# Patient Record
Sex: Female | Born: 1945 | Race: Black or African American | Hispanic: No | Smoking: Current some day smoker
Health system: Southern US, Community
[De-identification: ages and names within clinical notes are randomized; demographics above are authoritative.]

## PROBLEM LIST (undated history)

## (undated) DIAGNOSIS — E119 Type 2 diabetes mellitus without complications: Secondary | ICD-10-CM

## (undated) DIAGNOSIS — R5381 Other malaise: Secondary | ICD-10-CM

## (undated) DIAGNOSIS — G8929 Other chronic pain: Secondary | ICD-10-CM

## (undated) DIAGNOSIS — E46 Unspecified protein-calorie malnutrition: Secondary | ICD-10-CM

## (undated) DIAGNOSIS — M549 Dorsalgia, unspecified: Secondary | ICD-10-CM

## (undated) DIAGNOSIS — F419 Anxiety disorder, unspecified: Secondary | ICD-10-CM

## (undated) DIAGNOSIS — R0602 Shortness of breath: Secondary | ICD-10-CM

## (undated) DIAGNOSIS — D638 Anemia in other chronic diseases classified elsewhere: Secondary | ICD-10-CM

## (undated) DIAGNOSIS — D72829 Elevated white blood cell count, unspecified: Secondary | ICD-10-CM

## (undated) DIAGNOSIS — J9612 Chronic respiratory failure with hypercapnia: Secondary | ICD-10-CM

## (undated) DIAGNOSIS — M8580 Other specified disorders of bone density and structure, unspecified site: Secondary | ICD-10-CM

## (undated) DIAGNOSIS — I10 Essential (primary) hypertension: Secondary | ICD-10-CM

## (undated) DIAGNOSIS — J449 Chronic obstructive pulmonary disease, unspecified: Secondary | ICD-10-CM

## (undated) DIAGNOSIS — E785 Hyperlipidemia, unspecified: Secondary | ICD-10-CM

## (undated) DIAGNOSIS — J189 Pneumonia, unspecified organism: Secondary | ICD-10-CM

## (undated) DIAGNOSIS — Z9981 Dependence on supplemental oxygen: Secondary | ICD-10-CM

## (undated) DIAGNOSIS — F1011 Alcohol abuse, in remission: Secondary | ICD-10-CM

## (undated) DIAGNOSIS — J441 Chronic obstructive pulmonary disease with (acute) exacerbation: Secondary | ICD-10-CM

## (undated) DIAGNOSIS — K224 Dyskinesia of esophagus: Secondary | ICD-10-CM

## (undated) DIAGNOSIS — Z720 Tobacco use: Secondary | ICD-10-CM

## (undated) DIAGNOSIS — I739 Peripheral vascular disease, unspecified: Secondary | ICD-10-CM

## (undated) DIAGNOSIS — G709 Myoneural disorder, unspecified: Secondary | ICD-10-CM

## (undated) DIAGNOSIS — M1990 Unspecified osteoarthritis, unspecified site: Secondary | ICD-10-CM

## (undated) DIAGNOSIS — E559 Vitamin D deficiency, unspecified: Secondary | ICD-10-CM

## (undated) DIAGNOSIS — R1310 Dysphagia, unspecified: Secondary | ICD-10-CM

## (undated) HISTORY — DX: Other chronic pain: G89.29

## (undated) HISTORY — DX: Chronic obstructive pulmonary disease with (acute) exacerbation: J44.1

## (undated) HISTORY — PX: ABDOMINAL HYSTERECTOMY: SHX81

## (undated) HISTORY — DX: Anemia in other chronic diseases classified elsewhere: D63.8

## (undated) HISTORY — DX: Pneumonia, unspecified organism: J18.9

## (undated) HISTORY — DX: Chronic respiratory failure with hypercapnia: J96.12

## (undated) HISTORY — DX: Dorsalgia, unspecified: M54.9

## (undated) HISTORY — DX: Dysphagia, unspecified: R13.10

## (undated) HISTORY — DX: Essential (primary) hypertension: I10

## (undated) HISTORY — DX: Unspecified protein-calorie malnutrition: E46

## (undated) HISTORY — DX: Dyskinesia of esophagus: K22.4

## (undated) HISTORY — DX: Peripheral vascular disease, unspecified: I73.9

## (undated) HISTORY — DX: Tobacco use: Z72.0

## (undated) HISTORY — DX: Hyperlipidemia, unspecified: E78.5

## (undated) HISTORY — DX: Anxiety disorder, unspecified: F41.9

## (undated) HISTORY — DX: Other malaise: R53.81

## (undated) HISTORY — PX: BACK SURGERY: SHX140

## (undated) HISTORY — PX: CARPAL TUNNEL RELEASE: SHX101

## (undated) HISTORY — DX: Other specified disorders of bone density and structure, unspecified site: M85.80

## (undated) HISTORY — DX: Elevated white blood cell count, unspecified: D72.829

## (undated) HISTORY — DX: Vitamin D deficiency, unspecified: E55.9

## (undated) HISTORY — DX: Chronic obstructive pulmonary disease, unspecified: J44.9

## (undated) HISTORY — DX: Alcohol abuse, in remission: F10.11

---
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
## 1997-04-23 ENCOUNTER — Ambulatory Visit (HOSPITAL_COMMUNITY): Admission: RE | Admit: 1997-04-23 | Discharge: 1997-04-23 | Payer: Self-pay | Admitting: Family Medicine

## 1998-04-25 ENCOUNTER — Encounter: Payer: Self-pay | Admitting: Family Medicine

## 1998-04-25 ENCOUNTER — Ambulatory Visit (HOSPITAL_COMMUNITY): Admission: RE | Admit: 1998-04-25 | Discharge: 1998-04-25 | Payer: Self-pay | Admitting: Family Medicine

## 1998-04-30 ENCOUNTER — Other Ambulatory Visit: Admission: RE | Admit: 1998-04-30 | Discharge: 1998-04-30 | Payer: Self-pay | Admitting: Family Medicine

## 1998-09-18 ENCOUNTER — Ambulatory Visit (HOSPITAL_COMMUNITY): Admission: RE | Admit: 1998-09-18 | Discharge: 1998-09-18 | Payer: Self-pay | Admitting: Family Medicine

## 1998-09-18 ENCOUNTER — Encounter: Payer: Self-pay | Admitting: Family Medicine

## 1998-10-27 ENCOUNTER — Encounter: Admission: RE | Admit: 1998-10-27 | Discharge: 1999-01-25 | Payer: Self-pay | Admitting: Neurological Surgery

## 1998-11-03 ENCOUNTER — Encounter (HOSPITAL_BASED_OUTPATIENT_CLINIC_OR_DEPARTMENT_OTHER): Payer: Self-pay | Admitting: General Surgery

## 1998-11-03 ENCOUNTER — Ambulatory Visit (HOSPITAL_COMMUNITY): Admission: RE | Admit: 1998-11-03 | Discharge: 1998-11-03 | Payer: Self-pay | Admitting: General Surgery

## 1999-04-30 ENCOUNTER — Ambulatory Visit (HOSPITAL_COMMUNITY): Admission: RE | Admit: 1999-04-30 | Discharge: 1999-04-30 | Payer: Self-pay | Admitting: Family Medicine

## 1999-04-30 ENCOUNTER — Encounter: Payer: Self-pay | Admitting: Family Medicine

## 1999-05-12 ENCOUNTER — Other Ambulatory Visit: Admission: RE | Admit: 1999-05-12 | Discharge: 1999-05-12 | Payer: Self-pay | Admitting: Family Medicine

## 1999-07-12 ENCOUNTER — Encounter: Payer: Self-pay | Admitting: Neurological Surgery

## 1999-07-12 ENCOUNTER — Ambulatory Visit (HOSPITAL_COMMUNITY): Admission: RE | Admit: 1999-07-12 | Discharge: 1999-07-12 | Payer: Self-pay | Admitting: Neurological Surgery

## 2000-01-26 HISTORY — PX: POSTERIOR LUMBAR FUSION: SHX6036

## 2000-05-02 ENCOUNTER — Encounter: Payer: Self-pay | Admitting: Family Medicine

## 2000-05-02 ENCOUNTER — Ambulatory Visit (HOSPITAL_COMMUNITY): Admission: RE | Admit: 2000-05-02 | Discharge: 2000-05-02 | Payer: Self-pay | Admitting: Family Medicine

## 2000-05-10 ENCOUNTER — Other Ambulatory Visit: Admission: RE | Admit: 2000-05-10 | Discharge: 2000-05-10 | Payer: Self-pay | Admitting: Family Medicine

## 2001-04-06 ENCOUNTER — Encounter: Payer: Self-pay | Admitting: Neurological Surgery

## 2001-04-10 ENCOUNTER — Encounter: Payer: Self-pay | Admitting: Neurological Surgery

## 2001-04-10 ENCOUNTER — Observation Stay (HOSPITAL_COMMUNITY): Admission: RE | Admit: 2001-04-10 | Discharge: 2001-04-11 | Payer: Self-pay | Admitting: Neurological Surgery

## 2001-05-04 ENCOUNTER — Encounter: Payer: Self-pay | Admitting: Family Medicine

## 2001-05-04 ENCOUNTER — Ambulatory Visit (HOSPITAL_COMMUNITY): Admission: RE | Admit: 2001-05-04 | Discharge: 2001-05-04 | Payer: Self-pay | Admitting: Family Medicine

## 2001-07-05 ENCOUNTER — Encounter: Admission: RE | Admit: 2001-07-05 | Discharge: 2001-07-05 | Payer: Self-pay | Admitting: Neurological Surgery

## 2001-07-05 ENCOUNTER — Encounter: Payer: Self-pay | Admitting: Neurological Surgery

## 2001-08-24 ENCOUNTER — Other Ambulatory Visit: Admission: RE | Admit: 2001-08-24 | Discharge: 2001-08-24 | Payer: Self-pay | Admitting: Family Medicine

## 2002-05-07 ENCOUNTER — Encounter: Payer: Self-pay | Admitting: Family Medicine

## 2002-05-07 ENCOUNTER — Ambulatory Visit (HOSPITAL_COMMUNITY): Admission: RE | Admit: 2002-05-07 | Discharge: 2002-05-07 | Payer: Self-pay | Admitting: Family Medicine

## 2002-05-24 ENCOUNTER — Other Ambulatory Visit: Admission: RE | Admit: 2002-05-24 | Discharge: 2002-05-24 | Payer: Self-pay | Admitting: Family Medicine

## 2002-08-07 ENCOUNTER — Encounter: Payer: Self-pay | Admitting: Nephrology

## 2002-08-07 ENCOUNTER — Encounter: Admission: RE | Admit: 2002-08-07 | Discharge: 2002-08-07 | Payer: Self-pay | Admitting: Nephrology

## 2003-01-26 HISTORY — PX: LUMBAR LAMINECTOMY/DECOMPRESSION MICRODISCECTOMY: SHX5026

## 2003-05-08 ENCOUNTER — Ambulatory Visit (HOSPITAL_COMMUNITY): Admission: RE | Admit: 2003-05-08 | Discharge: 2003-05-08 | Payer: Self-pay | Admitting: Family Medicine

## 2003-05-13 ENCOUNTER — Ambulatory Visit (HOSPITAL_COMMUNITY): Admission: RE | Admit: 2003-05-13 | Discharge: 2003-05-13 | Payer: Self-pay | Admitting: Neurological Surgery

## 2003-06-17 ENCOUNTER — Encounter: Admission: RE | Admit: 2003-06-17 | Discharge: 2003-06-17 | Payer: Self-pay | Admitting: Family Medicine

## 2003-06-27 ENCOUNTER — Inpatient Hospital Stay (HOSPITAL_COMMUNITY): Admission: RE | Admit: 2003-06-27 | Discharge: 2003-07-05 | Payer: Self-pay | Admitting: Neurological Surgery

## 2003-11-05 ENCOUNTER — Encounter: Admission: RE | Admit: 2003-11-05 | Discharge: 2003-11-27 | Payer: Self-pay | Admitting: Neurological Surgery

## 2004-01-10 ENCOUNTER — Ambulatory Visit (HOSPITAL_COMMUNITY): Admission: RE | Admit: 2004-01-10 | Discharge: 2004-01-10 | Payer: Self-pay | Admitting: Neurological Surgery

## 2004-01-28 ENCOUNTER — Ambulatory Visit (HOSPITAL_COMMUNITY): Admission: RE | Admit: 2004-01-28 | Discharge: 2004-01-28 | Payer: Self-pay | Admitting: Neurological Surgery

## 2004-05-12 ENCOUNTER — Ambulatory Visit (HOSPITAL_COMMUNITY): Admission: RE | Admit: 2004-05-12 | Discharge: 2004-05-12 | Payer: Self-pay | Admitting: Family Medicine

## 2005-01-01 ENCOUNTER — Encounter: Admission: RE | Admit: 2005-01-01 | Discharge: 2005-01-01 | Payer: Self-pay | Admitting: Obstetrics and Gynecology

## 2005-02-19 ENCOUNTER — Encounter: Admission: RE | Admit: 2005-02-19 | Discharge: 2005-02-19 | Payer: Self-pay | Admitting: Orthopedic Surgery

## 2005-02-23 ENCOUNTER — Ambulatory Visit (HOSPITAL_BASED_OUTPATIENT_CLINIC_OR_DEPARTMENT_OTHER): Admission: RE | Admit: 2005-02-23 | Discharge: 2005-02-23 | Payer: Self-pay | Admitting: Orthopedic Surgery

## 2005-05-13 ENCOUNTER — Ambulatory Visit (HOSPITAL_COMMUNITY): Admission: RE | Admit: 2005-05-13 | Discharge: 2005-05-13 | Payer: Self-pay | Admitting: Family Medicine

## 2005-05-19 ENCOUNTER — Ambulatory Visit (HOSPITAL_COMMUNITY): Admission: RE | Admit: 2005-05-19 | Discharge: 2005-05-19 | Payer: Self-pay | Admitting: Internal Medicine

## 2005-12-31 ENCOUNTER — Ambulatory Visit: Payer: Self-pay | Admitting: Internal Medicine

## 2006-01-12 ENCOUNTER — Ambulatory Visit: Payer: Self-pay | Admitting: Internal Medicine

## 2006-05-16 ENCOUNTER — Ambulatory Visit (HOSPITAL_COMMUNITY): Admission: RE | Admit: 2006-05-16 | Discharge: 2006-05-16 | Payer: Self-pay | Admitting: Internal Medicine

## 2006-07-06 ENCOUNTER — Encounter: Admission: RE | Admit: 2006-07-06 | Discharge: 2006-07-06 | Payer: Self-pay | Admitting: Neurological Surgery

## 2007-05-18 ENCOUNTER — Ambulatory Visit (HOSPITAL_COMMUNITY): Admission: RE | Admit: 2007-05-18 | Discharge: 2007-05-18 | Payer: Self-pay | Admitting: Internal Medicine

## 2007-10-04 ENCOUNTER — Ambulatory Visit (HOSPITAL_COMMUNITY): Admission: RE | Admit: 2007-10-04 | Discharge: 2007-10-04 | Payer: Self-pay | Admitting: Neurological Surgery

## 2008-02-07 ENCOUNTER — Ambulatory Visit (HOSPITAL_COMMUNITY): Admission: RE | Admit: 2008-02-07 | Discharge: 2008-02-07 | Payer: Self-pay | Admitting: Internal Medicine

## 2008-05-20 ENCOUNTER — Ambulatory Visit (HOSPITAL_COMMUNITY): Admission: RE | Admit: 2008-05-20 | Discharge: 2008-05-20 | Payer: Self-pay | Admitting: Internal Medicine

## 2009-05-21 ENCOUNTER — Ambulatory Visit (HOSPITAL_COMMUNITY): Admission: RE | Admit: 2009-05-21 | Discharge: 2009-05-21 | Payer: Self-pay | Admitting: Internal Medicine

## 2009-07-26 ENCOUNTER — Emergency Department (HOSPITAL_COMMUNITY): Admission: EM | Admit: 2009-07-26 | Discharge: 2009-07-26 | Payer: Self-pay | Admitting: Emergency Medicine

## 2009-10-31 ENCOUNTER — Ambulatory Visit: Payer: Self-pay | Admitting: Vascular Surgery

## 2010-04-13 ENCOUNTER — Ambulatory Visit (HOSPITAL_COMMUNITY)
Admission: RE | Admit: 2010-04-13 | Discharge: 2010-04-13 | Disposition: A | Discharge status: Home / Self Care | Payer: Medicare Other | Source: Ambulatory Visit | Attending: Internal Medicine | Admitting: Internal Medicine

## 2010-04-13 DIAGNOSIS — J4489 Other specified chronic obstructive pulmonary disease: Secondary | ICD-10-CM | POA: Insufficient documentation

## 2010-04-13 DIAGNOSIS — J449 Chronic obstructive pulmonary disease, unspecified: Secondary | ICD-10-CM | POA: Insufficient documentation

## 2010-04-28 ENCOUNTER — Other Ambulatory Visit (HOSPITAL_COMMUNITY): Payer: Self-pay | Admitting: Internal Medicine

## 2010-04-28 DIAGNOSIS — Z1231 Encounter for screening mammogram for malignant neoplasm of breast: Secondary | ICD-10-CM

## 2010-04-29 ENCOUNTER — Encounter: Payer: Self-pay | Admitting: Internal Medicine

## 2010-04-29 ENCOUNTER — Ambulatory Visit (INDEPENDENT_AMBULATORY_CARE_PROVIDER_SITE_OTHER): Payer: Medicare Other | Admitting: Internal Medicine

## 2010-04-29 DIAGNOSIS — R079 Chest pain, unspecified: Secondary | ICD-10-CM | POA: Insufficient documentation

## 2010-04-29 DIAGNOSIS — J449 Chronic obstructive pulmonary disease, unspecified: Secondary | ICD-10-CM | POA: Insufficient documentation

## 2010-04-29 DIAGNOSIS — Z720 Tobacco use: Secondary | ICD-10-CM

## 2010-04-29 DIAGNOSIS — F17200 Nicotine dependence, unspecified, uncomplicated: Secondary | ICD-10-CM

## 2010-04-29 MED ORDER — QVAR 80 MCG/ACT IN AERS: INHALATION_SPRAY | RESPIRATORY_TRACT | Status: DC

## 2010-04-29 NOTE — Assessment & Plan Note (Signed)
Cad risk factors +. Pain worrisome for angina and ? Has angina equivalent +  PLAN Refer cards

## 2010-04-29 NOTE — Patient Instructions (Signed)
#  COPD  - you have very severe copd with asthma component - stop pulmicort and brovana nebs because of intolerance  - continue spiriva 1 puff daily - start QVAR 2 puff bid (take sample, show technique) - use oxygen with walking and sleep #SMOKING  - please write down an action plan with your husband  - please get booklet about quit smoking from our office  - please get contact # for smoking cessation class at  #Chest Pain   - am referring you to cardiology #Followup - 1 month  - at followup will do spirometry and check alpha 1 genetic test - based on cardiology followup I will refer you to pulmonary rehabilitation

## 2010-04-29 NOTE — Progress Notes (Signed)
SATURATION QUALIFICATIONS:   Patient Saturations on Room Air at Rest = 92%  Patient Saturations on Room Air while Ambulating = 87%  Patient Saturations on 2 Liters of oxygen while Ambulating = 93%   

## 2010-04-29 NOTE — Progress Notes (Signed)
Subjective:    Patient ID: Nicole Maxwell, female    DOB: 10-Apr-1945, 65 y.o.   MRN: [id number]  Shortness of Breath This is a chronic (New complaint for this OV. Insidious onset. Started several years ago) problem. The current episode started more than 1 year ago. The problem occurs intermittently. The problem has been gradually worsening (getting worse past 9 months). Associated symptoms include PND, sputum production and wheezing. Pertinent negatives include no abdominal pain, chest pain, claudication, coryza, ear pain, fever, headaches, leg pain, neck pain, orthopnea, rash, rhinorrhea, sore throat, swollen glands, syncope or vomiting. The symptoms are aggravated by exercise, any activity, emotional upset, smoke, lying flat, weather changes, pollens and odors (Activities like moving across room can make her dyspneic but distance is variable). Associated symptoms comments: Wakes up in morning with bad headache but denies snoring. 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 Heavy tobacco abuse +. 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 Sputum is clear and small amount . Risk factors include smoking. She has tried beta agonist inhalers, steroid inhalers and prescription cough suppressants (tried budesonide neb and brovana for 6-10 months but it made dyspnea worse, quit taking it March 2012. Been on O2 for 10 months but using it prn only but does find it helps. Has been  on hydrocodone - homatropine and it helps cough. Spiriva also helps) for the symptoms. The treatment provided mild (only spiriva and cough suppressant helps somewhat) relief. Her past medical history is significant for chronic lung disease and COPD. There is no history of allergies, aspirin allergies, asthma, bronchiolitis, CAD, DVT, a heart failure, PE, pneumonia or a recent surgery.   Also, central chest pain x 5 months that comes on with exertion and relieved by rest. No radiation. Rates pain  as moderate-severe but not sharp in quality. Associated with dyspnea +  CXR 07/26/2009 shows clear lung fields but hyperinflation. Recent CXR at PMD office report is same. 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 Has hx of negative treadmill cardiac stress test many years ago   Review of Systems  Constitutional: Positive for activity change. Negative for fever.  HENT: Negative.  Negative for ear pain, sore throat, rhinorrhea and neck pain.   Eyes: Negative.   Respiratory: Positive for sputum production, shortness of breath and wheezing.   Cardiovascular: Positive for PND. Negative for chest pain, orthopnea, claudication and syncope.  Gastrointestinal: Negative.  Negative for vomiting and abdominal pain.  Genitourinary: Negative.   Musculoskeletal: Positive for joint swelling and arthralgias. Negative for back pain and gait problem.  Skin: Negative.  Negative for rash.  Neurological: Negative.  Negative for headaches.  Psychiatric/Behavioral: Negative.        Objective:   Physical Exam  Vitals reviewed. Constitutional: She is oriented to person, place, and time. She appears well-developed and well-nourished. No distress.       Overweight. Deconditioned looking  HENT:  Head: Normocephalic and atraumatic.  Right Ear: External ear normal.  Left Ear: External ear normal.  Mouth/Throat: Oropharynx is clear and moist. No oropharyngeal exudate.  Eyes: Conjunctivae and EOM are normal. Pupils are equal, round, and reactive to light. Right eye exhibits no discharge. Left eye exhibits no discharge. No scleral icterus.  Neck: Normal range of motion. Neck supple. No JVD present. No tracheal deviation present. No thyromegaly  present.  Cardiovascular: Normal rate, regular rhythm, normal heart sounds and intact distal pulses.  Exam reveals no gallop and no friction rub.   No murmur  heard. Pulmonary/Chest: Effort normal. No respiratory distress. She has no wheezes. She has no rales. She exhibits no tenderness.       Prolonged expiration No wheeze ful sentences desatrated after walking 2 laps  Abdominal: Soft. Bowel sounds are normal. She exhibits no distension and no mass. There is no tenderness. There is no rebound and no guarding.  Musculoskeletal: Normal range of motion. She exhibits no edema and no tenderness.  Lymphadenopathy:    She has no cervical adenopathy.  Neurological: She is alert and oriented to person, place, and time. She has normal reflexes. No cranial nerve deficit. She exhibits normal muscle tone. Coordination normal.  Skin: Skin is warm and dry. No rash noted. She is not diaphoretic. No erythema. No pallor.  Psychiatric: She has a normal mood and affect. Her behavior is normal. Judgment and thought content normal.          Assessment & Plan:

## 2010-04-29 NOTE — Assessment & Plan Note (Addendum)
Gold stage 4 COPD. Desaturates on exertion. BD response +. Intolerant to brovana and pumicort nebs  PLAN  - you have very severe copd with asthma component - stop pulmicort and brovana nebs because of intolerance  - continue spiriva 1 puff daily - start QVAR 2 puff bid (take sample, show technique) - use oxygen with walking and sleep - next ov check alpha 1 - discuss rehab after cards clearance for chest pain - will discuss prognosis at followup

## 2010-04-29 NOTE — Assessment & Plan Note (Signed)
"   I am addicted" . Prolonged heavy smoking. She wants to quit smoking. Took chantix mid 2000s but failed due to $ issues. Did attend counselling in past. Wants husband also to quit. Agreeable to try chantix again. Willing to go to cessation program again. Willing to try chantix again. Will refer her to quit smoking program at [place]. Reading materials given. Will discuss chantix at next visit Husband also counselled. Advised both of them to come up with joint action plan  11 min counseling

## 2010-05-06 ENCOUNTER — Telehealth: Payer: Self-pay | Admitting: Internal Medicine

## 2010-05-06 NOTE — Telephone Encounter (Signed)
Spoke w/ pt and she states she has been waiting since 04/29/10 to receive her portable oxygen. Pt states Dr. Marchelle Gearing advised her he was going to get her set up w/ portable oxygen at OV on 04/29/10. I am not seeing where MR has mentioned this in his ov. Pt aware MR is out of the office until Monday. Please advise MR if okay to send order to [facility] for portable o2. Thanks

## 2010-05-07 ENCOUNTER — Telehealth: Payer: Self-pay | Admitting: Internal Medicine

## 2010-05-07 NOTE — Telephone Encounter (Signed)
Ok for her to have portable o2

## 2010-05-07 NOTE — Telephone Encounter (Signed)
Pt aware order was sent for her to be set up on portable o2.  Order has already been sent.  Will forward to [facility] so they are aware

## 2010-05-08 NOTE — Telephone Encounter (Signed)
Order faxed to [facility] to get pt portable 02

## 2010-05-11 ENCOUNTER — Telehealth: Payer: Self-pay | Admitting: Internal Medicine

## 2010-05-11 NOTE — Telephone Encounter (Signed)
Pt aware it is okay to try nicotine patches to assist in stopping smoking. She was instructed to call if this does not help or if she is having any breathing issues.

## 2010-05-12 ENCOUNTER — Encounter: Payer: Self-pay | Admitting: Cardiology

## 2010-05-12 ENCOUNTER — Ambulatory Visit (INDEPENDENT_AMBULATORY_CARE_PROVIDER_SITE_OTHER): Payer: Medicare Other | Admitting: Cardiology

## 2010-05-12 DIAGNOSIS — E785 Hyperlipidemia, unspecified: Secondary | ICD-10-CM

## 2010-05-12 DIAGNOSIS — R079 Chest pain, unspecified: Secondary | ICD-10-CM

## 2010-05-12 DIAGNOSIS — F17200 Nicotine dependence, unspecified, uncomplicated: Secondary | ICD-10-CM

## 2010-05-12 DIAGNOSIS — I10 Essential (primary) hypertension: Secondary | ICD-10-CM | POA: Insufficient documentation

## 2010-05-12 DIAGNOSIS — Z720 Tobacco use: Secondary | ICD-10-CM

## 2010-05-12 NOTE — Progress Notes (Signed)
HPI The patient presents for evaluation of chest discomfort. She has no past cardiac history but significant cardiovascular risk factors. She reports that 4 or 5 months ago she started developing acute shortness of breath and chest discomfort and noticed that every time she took [place] and Budesonide that she would become acutely short of breath and had chest discomfort which he described as an aching. This would last for several minutes. It is a midsternal discomfort. It would go away on its own. She did not have associated nausea vomiting or diaphoresis. She did not describe neck or arm discomfort. She stopped taking his medications are the symptoms have resolved. She is very limited by back pain and dyspnea. She reports that she cannot bring on the symptoms with her limited activities. She does not describe PND or orthopnea. She does not describe palpitations, presyncope or syncope.  Allergies  Allergen Reactions  . Brovana (Arformoterol Tartrate) Shortness Of Breath and Cough    General intolerance  . Pulmicort Shortness Of Breath and Cough    General intolerance    Current Outpatient Prescriptions  Medication Sig Dispense Refill  . albuterol (PROVENTIL) (2.5 MG/3ML) 0.083% nebulizer solution Take 2.5 mg by nebulization every 4 (four) hours as needed.        Marland Kitchen alprazolam (XANAX) 2 MG tablet Take 1 mg by mouth at bedtime as needed.        Marland Kitchen aspirin 325 MG tablet Take 325 mg by mouth daily.        . calcium carbonate (OS-CAL) 600 MG TABS Take 600 mg by mouth daily.        . Cholecalciferol (VITAMIN D3) 1000 UNITS CAPS Take 1 capsule by mouth daily.        . ergocalciferol (VITAMIN D2) 50000 UNITS capsule Take 50,000 Units by mouth once a week.        Marland Kitchen HYDROcodone-homatropine (HYCODAN) 5-1.5 MG/5ML syrup Take by mouth every 6 (six) hours as needed.        Marland Kitchen losartan-hydrochlorothiazide (HYZAAR) 100-25 MG per tablet Take 1 tablet by mouth daily.        . metFORMIN (GLUMETZA) 500 MG (MOD) 24 hr  tablet Take 500 mg by mouth 2 (two) times daily with a meal.        . QVAR 80 MCG/ACT inhaler 2 puff twice daily. Rinse mouth after use   1 Inhaler  12  . rosuvastatin (CRESTOR) 20 MG tablet Take 20 mg by mouth daily.        Marland Kitchen tiotropium (SPIRIVA) 18 MCG inhalation capsule Place 18 mcg into inhaler and inhale daily.          Past Medical History  Diagnosis Date  . Hypertension   . Hyperlipidemia   . Anxiety   . Chronic back pain   . Osteopenia   . COPD (chronic obstructive pulmonary disease)     cxr 04/03/2010 - hyperinflation  . PVD (peripheral vascular disease)   . Tobacco abuse     " I am addicted"   . Diabetes mellitus   . Vitamin D deficiency disease     03/26/2010 - 9.9ng/mL. Started on replacemnt  . Alcohol abuse, in remission quit 2004    Past Surgical History  Procedure Date  . Tonsillectomy   . Appendectomy   . Cholecystectomy   . Spinal fusion     Family History  Problem Relation Age of Onset  . Heart attack Sister 80    CABG  . Breast cancer Sister   .  Cancer Brother   . Hypertension Other     all siblings    History   Social History  . Marital Status: Married    Spouse Name: N/A    Number of Children: 1  . Years of Education: N/A   Occupational History  . retired from [employer]    Social History Main Topics  . Smoking status: Current Everyday Smoker -- 1.0 packs/day for 30 years    Types: Cigarettes  . Smokeless tobacco: Not on file   Comment: referred to quit smoking program at [place]  . Alcohol Use: No     h/o heavy ETOH quit 2004  . Drug Use: Not on file  . Sexually Active: Not on file   Other Topics Concern  . Not on file   Social History Narrative  . No narrative on file    ROS:  Positive for cough, difficulty hearing, back pain, occasional constipation, joint pain. Otherwise as stated in the history of present illness negative for all other systems.  PHYSICAL EXAM BP 120/69  Pulse 98  Resp 18  Ht 5\' 4"  (1.626 m)   Wt 153 lb (69.4 kg)  BMI 26.26 kg/m2 GENERAL:  Well appearing HEENT:  Pupils equal round and reactive, fundi not visualized, oral mucosa unremarkable NECK:  No jugular venous distention, waveform within normal limits, carotid upstroke brisk and symmetric, no bruits, no thyromegaly LYMPHATICS:  No cervical, inguinal adenopathy LUNGS:  Clear to auscultation bilaterally BACK:  No CVA tenderness CHEST:  Unremarkable HEART:  PMI not displaced or sustained,S1 and S2 within normal limits, no S3, no S4, no clicks, no rubs, no murmurs ABD:  Flat, positive bowel sounds normal in frequency in pitch, no bruits, no rebound, no guarding, no midline pulsatile mass, no hepatomegaly, no splenomegaly EXT:  2 plus pulses throughout, no edema, no cyanosis no clubbing SKIN:  No rashes no nodules NEURO:  Cranial nerves II through XII grossly intact, motor grossly intact throughout PSYCH:  Cognitively intact, oriented to person place and time   EKG:  Sinus rhythm, right atrial enlargement, left axis deviation, left anterior fascicular block, poor anterior R-wave progression, nonspecific T-wave flattening.  ASSESSMENT AND PLAN

## 2010-05-12 NOTE — Assessment & Plan Note (Signed)
I reviewed her lipid with an LDL of 81 HDL of 51 last month. She will continue with the meds as listed.

## 2010-05-12 NOTE — Patient Instructions (Signed)
You are being scheduled for a Dobutamine Echo.  Your follow up appointment will be based on these results. Continue your current medications

## 2010-05-12 NOTE — Assessment & Plan Note (Signed)
The blood pressure is at target. No change in medications is indicated. We will continue with therapeutic lifestyle changes (TLC).  

## 2010-05-12 NOTE — Assessment & Plan Note (Signed)
We discussed this at length. She does have plans to try to quit and has the patches.Marland Kitchen

## 2010-05-12 NOTE — Assessment & Plan Note (Signed)
The chest pain could have been related to medications. However, with her significant risk factors she needs stress testing. She would not be able to walk on a treadmill. I am going to try a dobutamine echocardiogram she has appropriate windows. Her lung disease probably wouldn't tolerate a Lexiscan if needed.

## 2010-05-14 ENCOUNTER — Encounter: Payer: Self-pay | Admitting: Internal Medicine

## 2010-05-19 ENCOUNTER — Institutional Professional Consult (permissible substitution): Payer: Medicare Other | Admitting: Cardiology

## 2010-05-22 ENCOUNTER — Ambulatory Visit (HOSPITAL_COMMUNITY): Payer: Medicare Other | Attending: Cardiology | Admitting: Radiology

## 2010-05-22 DIAGNOSIS — R072 Precordial pain: Secondary | ICD-10-CM

## 2010-05-22 DIAGNOSIS — R0989 Other specified symptoms and signs involving the circulatory and respiratory systems: Secondary | ICD-10-CM

## 2010-05-22 DIAGNOSIS — R0602 Shortness of breath: Secondary | ICD-10-CM

## 2010-05-22 DIAGNOSIS — R079 Chest pain, unspecified: Secondary | ICD-10-CM | POA: Insufficient documentation

## 2010-05-22 DIAGNOSIS — R0609 Other forms of dyspnea: Secondary | ICD-10-CM

## 2010-05-22 MED ORDER — SODIUM CHLORIDE 0.9 % IV SOLN
30.0000 ug/kg | INTRAVENOUS | Status: DC
  Administered 2010-05-22: 30 ug/kg/min via INTRAVENOUS

## 2010-05-25 ENCOUNTER — Ambulatory Visit (HOSPITAL_COMMUNITY)
Admission: RE | Admit: 2010-05-25 | Discharge: 2010-05-25 | Disposition: A | Discharge status: Home / Self Care | Payer: Medicare Other | Source: Ambulatory Visit | Attending: Internal Medicine | Admitting: Internal Medicine

## 2010-05-25 DIAGNOSIS — Z1231 Encounter for screening mammogram for malignant neoplasm of breast: Secondary | ICD-10-CM

## 2010-05-25 NOTE — Progress Notes (Signed)
Pt aware of results of echo 

## 2010-05-27 ENCOUNTER — Telehealth: Payer: Self-pay | Admitting: Cardiology

## 2010-05-27 NOTE — Telephone Encounter (Signed)
The cost of medication is high. Qvar 80 mg. Any suggestion .

## 2010-05-27 NOTE — Telephone Encounter (Signed)
[facility]

## 2010-05-27 NOTE — Telephone Encounter (Signed)
Left message for pt, she should call her pulmonary md regarding her inhaler Nicole Maxwell

## 2010-05-27 NOTE — Telephone Encounter (Signed)
Pt needs proventil prescription to be mail to her at home.

## 2010-06-01 ENCOUNTER — Telehealth: Payer: Self-pay | Admitting: Internal Medicine

## 2010-06-01 DIAGNOSIS — Z720 Tobacco use: Secondary | ICD-10-CM

## 2010-06-01 MED ORDER — QVAR 80 MCG/ACT IN AERS: INHALATION_SPRAY | RESPIRATORY_TRACT | Status: DC

## 2010-06-01 NOTE — Telephone Encounter (Signed)
ok 

## 2010-06-01 NOTE — Telephone Encounter (Signed)
Spoke w/ pt and she states she needed [facility] and I advised her this has already been done. See previous phone note

## 2010-06-01 NOTE — Telephone Encounter (Signed)
Spoke w/ pt and she states the qvar is too expensive through [facility] and needs [facility] for 90 days supply. I advised pt rx has been sent and nothing further was needed

## 2010-06-01 NOTE — Telephone Encounter (Signed)
Error- pt says she called last wk to req QVAR refill- no msg was on this so I created a new one today. Nicole Maxwell

## 2010-06-08 ENCOUNTER — Telehealth: Payer: Self-pay | Admitting: Internal Medicine

## 2010-06-08 NOTE — Telephone Encounter (Signed)
LMTCB

## 2010-06-09 NOTE — Telephone Encounter (Signed)
Returning call.

## 2010-06-09 NOTE — Telephone Encounter (Signed)
Spoke with pt. She states that she received letter from medicare stating that MR needs to write a letter of medical Arther Dames stating why she needs to take qvar. She states that she still has some medication left and does not require samples at this time. Letter was dropped off yesterday. Pls advise thanks!

## 2010-06-10 NOTE — Telephone Encounter (Signed)
Called the 1-800-Medicare and was advised I needed to call [employer] at [phone] which is her drug plan. Was on hold x 10 minutes. WCB later

## 2010-06-10 NOTE — Telephone Encounter (Signed)
Pt dropped of the letter.  Looks like Medicare was requesting an PA but per pharmacies no PA is needed.  # on letter for Medicare - called but was on hold for several minutes.  WCB.

## 2010-06-10 NOTE — Telephone Encounter (Signed)
ATC the [phone] number and was on hold again x 10 minutes. Wcb

## 2010-06-10 NOTE — Telephone Encounter (Signed)
Where is the letter now ? Is it a prior aut that Alida can fill or I need to dictate and type a letter ? I am traveling out of town for a week. Please leave it in my office look at if it is something I have to do

## 2010-06-10 NOTE — Telephone Encounter (Signed)
Spoke with [employer].  Per Victorino Dike, she does not have this letter.  ? If this is a PA.    I called [facility] bc this rx was sent to them on 06/01/10.  Spoke with Iona Hansen, Pharmacist.  States they are getting a rejection notice bc it is too soon to fill qvar for pt.  States she had this filled on 5.7.12 at a local [facility] and they cannot fill it until 5.28.12.  States they are not getting a message for PA.  I called [facility], spoke with [place].  She states qvar rx was filled on 5.7.12.  Pt paid $41 but states this was her copay and insurance paid the remainder.  States no PA was needed.    I called the pt.  She states the letter she received states qvar is not on the prefered list.  States the letter is requesting a letter to Medicare stating why she needs this medication.  Informed pt letter has been misplaced and apologized for this.  Pt will bring the letter by today so we can see exactly what the letter is requesting.

## 2010-06-11 NOTE — Telephone Encounter (Signed)
ATC [phone] and was on hold again x 10 minutes. Will sign off message since pharmacy states pt already has medication and pt just tried to get it filled to early and no PA was needed. Victorino Dike has letter in case this comes up again.

## 2010-06-12 NOTE — H&P (Signed)
NAME:  Nicole Maxwell, Nicole Maxwell                            ACCOUNT NO.:  [id number]   MEDICAL RECORD NO.:  [id number]                   PATIENT TYPE:  INP   LOCATION:  2899                                 FACILITY:  [facility]   PHYSICIAN:  Stefani Dama, M.D.               DATE OF BIRTH:  1945/12/08   DATE OF ADMISSION:  06/27/2003  DATE OF DISCHARGE:                                HISTORY & PHYSICAL   ADMITTING DIAGNOSIS:  Spondylosis, L4-L5, with herniated nucleus pulposus,  lumbar stenosis and lumbar radiculopathy.   HISTORY OF PRESENT ILLNESS:  Mrs. Nicole Maxwell is a 65 year old individual  who has been a patient in my practice for at least 5 years' period of time.  She had had a previous injury with back and leg pain that occurred some time  in the year 2000.  The patient was seen and worked up with an MRI which  demonstrated she had some modest degenerative changes at multiple levels, L2-  3, 3-4, 4-5 and 5-1.  She had a paracentral subligamentous protrusion with  some effacement of the dural tube but no overt nerve root compression; this  was particularly noted at the level of L5-S1.  At that time, she was treated  conservatively and she seemed to respond fairly well with physical therapy;  however, over the years, she has had worsening pain and then in March of  2003, she presented with a herniated nucleus pulposus in the extraforaminal  space at L3-4 on the left.  She was advised regarding surgical decompression  of this lesion and this was undertaken via a [employer] diskectomy.  The  patient has had continued problems with back pain and leg pain and more  recently has developed severe right leg pain with weakness down into the  tibialis anterior groove.  She was found to have evidence of broad-based  protrusion of the disk at L4-L5 with progressive spondylitic changes.  She  was advised regarding surgery and this would include a decompression and  arthrodesis at the level of L4-L5; she is  now being admitted for this  procedure.   PAST MEDICAL HISTORY/MEDICATIONS:  Her past medical history is significant  for:  1. Hypertension, treated with hydrochlorothiazide.  She has also been more     recently on lisinopril; she takes 10 mg a day.  2. She has been on Pravachol for high cholesterol.  3. She has a significant history of COPD and has is on Singulair and VoSpire     4 mg 2 tablets b.i.d.  4. She also takes amitriptyline at bedtime, 100 mg.  5. She has been using OxyContin 20 mg b.i.d. for pain control.   SOCIAL HISTORY:  She had been smoking a pack of cigarettes a day up until  2003.  She does not drink alcohol.  She states her weight has been stable at  about 195 pounds and  5 foot 4 inches in height.   FAMILY HISTORY:  There is no significant family history of medical problems.   REVIEW OF SYSTEMS:  Systems review is noted for difficulty with high blood  pressure, high cholesterol, leg pain, back pain, arm pain and neck pain on a  14-point review sheet.   PHYSICAL EXAMINATION:  GENERAL:  Her physical examination reveals that she  is an alert, oriented and cooperative individual in moderate distress.  NEUROLOGIC:  When she stands, she tends to favor a 5- to 10-degree forward  stoop.  Her mobility forward and bending is limited to 45 degrees.  She  extends only to neutral with some difficulty.  Her motor strength is intact  in the iliopsoas and the quads.  The tibialis anterior on the left side  appears to be somewhat weakened, 4/5, as it does on the right side.  The  extensor hallucis longus on the right side is particularly weakened at 4/5;  it is intact on the left side.  Gastroc strength is intact bilaterally.  Deep tendon reflexes are depressed in the patellae, being trace, 1+ in both  Achilles.  Biceps reflexes are 1+.  The triceps reflexes are 1+.  Sensation  is diminished in the L5 distribution on the right side, particularly noted  on the dorsum of the foot  and the ankle.  Cranial nerve examination reveals  the pupils are 4 mm and briskly reactive to light and accommodation.  The  extraocular movements are full and the face is symmetric to grimace.  Tongue  and uvula are in the midline.  EYES:  Sclerae and conjunctivae are clear.  NECK:  The neck reveals no masses and no bruits are heard.  LUNGS:  Lungs are clear to auscultation.  HEART:  The heart has a regular rate and rhythm.  ABDOMEN:  The abdomen is soft.  Bowel sounds are positive.  No masses are  palpable.  EXTREMITIES:  Extremities reveal no cyanosis, clubbing or edema.   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  She  is now to undergo surgical decompression and stabilization with a posterior  interbody technique.                                                Stefani Dama, M.D.    [name]  D:  06/27/2003  T:  06/27/2003  Job:  [id number]

## 2010-06-12 NOTE — Discharge Summary (Signed)
NAME:  Nicole Maxwell, Nicole Maxwell                            ACCOUNT NO.:  [id number]   MEDICAL RECORD NO.:  [id number]                   PATIENT TYPE:  INP   LOCATION:  3024                                 FACILITY:  [facility]   PHYSICIAN:  Stefani Dama, M.D.               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   DISCHARGE AND FINAL DIAGNOSES:  1. Lumbar spondylosis with herniated nucleus pulposus, L4-L5, with lumbar     radiculopathy and back pain.  2. Delirium, acute postoperative short term.   HOSPITAL COURSE:  Nicole Maxwell is a 65 year old individual who has had  significant problems with back and leg pain.  She has diffuse degenerative  changes, but it was noted that she had a herniated nucleus pulposus at the  L4-L5 level that had been giving her considerable difficulties with back and  leg pain.  She was advised regarding surgical consideration which would  require decompression and arthrodesis of the L4-L5 level.  This was  performed on June 2,2005, and postoperatively, the patient seemed to do  quite well for the first 48 hours, but then about 72 hours out, she was  noted to have acute delirium, and it was felt that this may be due either to  post-op stay in the ICU or possibly some issues regarding medication usage.  This was supported by decreasing the dose and timing of her medication, and  gradually, her delirium appeared to clear.  It was noted that during the  daytime she would be intact, but at night, she would particularly become  combative, requiring restraint.  By the fifth postoperative day, she was  ambulatory, and she was transferred to the floor.  She was observed for an  additional few days' time, and her condition continued to remain stable.   She is discharged now with a prescription for Percocet #100 without refills.  She also has Zanaflex 2 mg every 8 hours for muscle spasm.Marland Kitchen  Her incision is  clean and dry, and she will be seen in the office in three weeks' time for  further followup.                                                Stefani Dama, M.D.    [name]  D:  07/05/2003  T:  07/06/2003  Job:  [id number]

## 2010-06-12 NOTE — Op Note (Signed)
NAME:  Nicole Maxwell, Nicole Maxwell                            ACCOUNT NO.:  [id number]   MEDICAL RECORD NO.:  [id number]                   PATIENT TYPE:  INP   LOCATION:  2899                                 FACILITY:  [facility]   PHYSICIAN:  Stefani Dama, M.D.               DATE OF BIRTH:  Nov 15, 1945   DATE OF PROCEDURE:  06/27/2003  DATE OF DISCHARGE:                                 OPERATIVE REPORT   PREOPERATIVE DIAGNOSIS:  Spondylosis, herniated nucleus pulposus, L4-5, with  lumbar radiculopathy and back pain.   POSTOPERATIVE DIAGNOSIS:  Spondylosis, herniated nucleus pulposus, L4-5,  with lumbar radiculopathy and back pain.   PROCEDURE:  Bilateral lumbar laminectomy, L4-5, diskectomy, posterior  interbody arthrodesis with Peek bone spacers, pedicle fixation with Alphatek  instrumentation, posterolateral arthrodesis with local autograft and  allograft.   SURGEON:  Stefani Dama, M.D.   FIRST ASSISTANT:  Payton Doughty, M.D.   ANESTHESIA:  General endotracheal.   INDICATIONS:  Nicole Maxwell is a 65 year old individual who has had a long  history of substantial back pain with previous herniated nucleus pulposus at  L3-4 on the right side in the extraforaminal space.  She was treated  conservatively all along but now demonstrates that she has severe  degeneration of the disk at L4-5 with __________ end plate changes, and she  also has a disk herniation causing compression of her right L4 nerve root as  it exited the foramen.  She has been advised regarding surgical  decompression and stabilization of the L4-5 joint.   PROCEDURE:  The patient was brought to the operating room supine on a  stretcher.  After the smooth induction of general anesthesia, she is turned  prone and the back was shaved and prepped with Duraprep and draped in a  sterile fashion.  A midline incision was created and carried down to the  lumbar dorsal fascia, which was exposed on either side of the midline to  identify  positively the spinous process of L4 radiographically.  Then the  interlaminar space at L4-5 was cleared over the facet joint, which was  markedly hypertrophic.  A self-retaining McCullough retractor was placed in  the wound.  Care was taken to dissect down to the transverse process of L5  and superiorly to the transverse process of L4.  The capsule of the L3 facet  joint was maintained intact, and dissection was then carried out laterally  in a subperiosteal plane.  Once the lateral gutters were packed off and the  intertransverse space was dissected free, soft tissues were removed from the  facet joints at L4-5 and then a laminectomy and complete facetectomy was  performed on the right side to identify the common dural tube, which was  tented dorsally over a substantial mass, which was then identified to be the  disk.  By retracting the dura and doing microdissection to dissect the  epidural veins away from the surrounding soft tissues, the disk space was  then entered with a 15 blade and a combination of curettes and rongeurs were  used to dissect the disk space free and clear up any intervening tissue.  Once the disk space was opened, it was evacuated with substantial quantity  of substantially degenerated disk material.  The lateral recess under the  remnants of the superior facet of L5 was then cleared by removing overgrown  bone and then disk material to decompress the exiting L4 nerve root  superiorly and decompress the disk pace itself.  Attention was then turned  to the left side, where a similar procedure was carried out; however, here  there was no evidence of  a laterally herniated disk as there was on the  right side.  Once the disk space was completely cleared, the end plates were  rongeured smooth and a series of disk rongeurs were used to further enlarge  the disk space.  Disk shapers were used up to the 9 mm size.  Once this was  secured, it was felt that 10 mm interbody  spacers would fit appropriately.  These were packed with the patient's autologous bone, and this also was  mixed with some Cortoss tricalcium phosphate bone substitute.  The Cortoss  was mixed with bone that was retrieved from the pedicle probes and each of  the pedicles at L4 and L5.  With this the cages were then tamped and counter  sunk approximately 3 mm.  Pedicle entry sites were then chosen at L4 and L5.  These were checked radiographically along with checking for placement of the  cages and once they were found to be adequate, pedicle probes were replaced  with 5.5 x 40 mm pedicle screws at L5 and 5.5 x 40 mm screws at L4.  Again,  with each placement of the screw the screws were pre-tapped.  They were  checked for cut-out both visually and palpably, and then the screws were  placed.  Once the screws were placed and their positioning was checked  radiographically, 35 mm pre-contoured rods were placed in between the screw  heads and these were tightened down in a slight amount of flexion to  maintain good alignment and good lordotic curvature.  Then the remainder of  the bone was packed into the interspace both medially and laterally to the  cages and also into the lateral gutters between L4 and L5 transverse  processes.  Final radiographic confirmation of the construct was obtained  and then the wound was copiously irrigated with antibiotic irrigating  solution and the lumbar dorsal fascia was closed with #1 Vicryl in an  interrupted fashion and 2-0 Vicryl was used in the subcutaneous tissues.  Vicryl 3-0 was used subcuticularly.  Dermabond was placed on the skin.  Blood loss was estimated at approximately 500-550 mL, and 250 mL was  returned from the Cell Saver.                                               Stefani Dama, M.D.    Nicole Maxwell  D:  06/27/2003  T:  06/27/2003  Job:  [id number]

## 2010-06-12 NOTE — Op Note (Signed)
NAMEGIONNI, FREESE NO.:  [id number]   MEDICAL RECORD NO.:  [id number]          PATIENT TYPE:  OIB   LOCATION:  2899                         FACILITY:  [facility]   PHYSICIAN:  Stefani Dama, M.D.  DATE OF BIRTH:  July 28, 1945   DATE OF PROCEDURE:  01/28/2004  DATE OF DISCHARGE:                                 OPERATIVE REPORT   PREOPERATIVE DIAGNOSIS:  Left carpal tunnel syndrome.   POSTOPERATIVE DIAGNOSIS:  Left carpal tunnel syndrome.   PROCEDURES:  Left carpal tunnel release.   SURGEON:  Stefani Dama, M.D.   ANESTHESIA:  Local plus monitored anesthesia control.   INDICATIONS:  Ms. Marcoux is a 65 year old individual who has had significant  bilateral carpal tunnel syndrome.  She has had a previous right-sided  release which seemed to relieve her pain there.  Now she is having  increasing left-sided symptoms.  She has been advised regarding carpal  tunnel release and is taken to the operating room for this procedure.   DESCRIPTION OF PROCEDURE:  The patient was brought to the operating room  supine on the stretcher.  After the instillation of some IV sedation, the  left wrist was prepped with Duraprep and draped in a sterile fashion.  The  wrist itself was examined.  An area from the mid point on the wrist crease  to the web space between the third and fourth ray was drawn.  This area was  infiltrated with local 1% lidocaine half and half with 0.5% Marcaine for a  total of 4 mL.  The skin was then incised with a 15 blade and dissection was  taken through the superficial subcutaneous tissues down into the deeper  tissues and then the transcarpal ligament was identified.  The ligament was  sectioned in a very piecemeal fashion carefully controlling dissection just  simply through the top of the ligament.  The ligament was noted to be very  hypertrophied.  The underlying tissue was noted to be very blanched.  Once  the underlying nerve was identified, care was  taken to protect the ligament  from the nerve and then dissect very carefully along the dorsal and ulnar  aspect of the nerve both proximally and distally, thus releasing the  entirety of the ligament through a 2 cm incision.  The area was inspected.  Some bleeding from subcutaneous tissue was controlled with bipolar cautery.  Once it was ascertained that the ligament was divided both proximally and  distally along its entire length and the nerve was completely freed, then  the skin over the area was closed with vertical mattress sutures of 4-0  nylon.  A dry sterile dressing was then applied to the wrist and the patient  was returned to the recovery room in stable condition.      Henr   HJE/MEDQ  D:  01/28/2004  T:  01/28/2004  Job:  [id number]

## 2010-06-12 NOTE — Op Note (Signed)
[place]  Patient:    Nicole Maxwell, Nicole Maxwell Visit Number: [id number] MRN: [id number]          Service Type: SUR Location: 3000 [id number] Attending Physician:  Jonne Ply Dictated by:   Stefani Dama, M.D. Proc. Date: 04/10/01 Admit Date:  04/10/2001 Discharge Date: 04/11/2001                             Operative Report  PREOPERATIVE DIAGNOSIS:  Herniated nucleus pulposus L3-4, left with left lumbar radiculopathy.  POSTOPERATIVE DIAGNOSIS:  Herniated nucleus pulposus L3-4, left with left lumbar radiculopathy.  OPERATION PERFORMED: L3-4 extraforaminal microendoscopic diskectomy with MetRx system, microscope and microdissection technique.  SURGEON:  Stefani Dama, M.D.  ASSISTANT:  Hewitt Shorts, M.D.  ANESTHESIA:  General endotracheal.  INDICATIONS FOR PROCEDURE:  The patient is a 65 year old individual who has had significant back and left lower extremity pain and weakness in the quadriceps.  She was advised regarding surgical diskectomy after a recent MRI demonstrated the rather large size of this disk herniation.  DESCRIPTION OF PROCEDURE:  The patient was brought to the operating room supine on the stretcher.  After smooth induction of general endotracheal anesthesia, she was turned prone.  The back was shaved, prepped with DuraPrep and draped in a sterile fashion.  A fluoroscope was used to isolate the left paramedian region just above the L3 pars at the L3-4 disk space.  A K-wire was passed to the target zone and then a series of dilators was used over the K-wire with wanding technique to expose the extraforaminal space.  A 6 cm long by 18 mm wide endoscopic cannula was affixed to the bed clamp on the operating table and this was used as the primary dissection tool.  Then with the microscope in place, microdissection technique was used to expose the lateral aspect of the pars, superior portion of the facet joint at  L3-4.  The soft tissues over this area were then cauterized away with the monopolar cautery and then the lateral aspect of the pars was drilled down with the 2.3 mm dissecting tool and a 2 to 3 mm Kerrison punch was used to remove bone from this area.  The inferior aspect of the nerve root was then explored. Underneath the inferior aspect there was noted to be a significant mass pressing into the nerve root itself.  The dissection was then carried out just inferior to the nerve root and the mass was entered and was found to be several fragments of disk material in the subligamentous space.  These fragments were removed.  The space was explored further and the disk space was then entered via this approach and several other pieces of disk material were removed from within the disk space itself.  In the end, the path of the exiting L3 nerve root was freed and cleared and the nerve root was noted to be sitting a lot more naturally in its travel out the extraforaminal space. Hemostasis of some epidural bleeding in this region was controlled with bipolar cautery and a single pledget of Gelfoam soaked in thrombin which was later irrigated away.  The hemostasis thus being achieved adequately, the endoscopic cannula was removed.  The microscope was removed.  A single 3-0 stitch was placed in the fascia and subcuticular tissues were closed with 3-0 subcuticular suture.  The patient tolerated the procedure well and was returned to the recovery  room in stable condition. Dictated by:   Stefani Dama, M.D. Attending Physician:  Jonne Ply DD:  04/10/01 TD:  04/11/01 Job: [id number]

## 2010-06-12 NOTE — Op Note (Signed)
Nicole, Maxwell                  ACCOUNT NO.:  [id number]   MEDICAL RECORD NO.:  [id number]          PATIENT TYPE:  AMB   LOCATION:  DSC                          FACILITY:  [facility]   PHYSICIAN:  Katy Fitch. Sypher, M.D. 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  Also left thumb carpometacarpal  joint degenerative arthritis.   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  Also left thumb carpometacarpal  joint degenerative arthritis.   OPERATION:  1.  Decompression of left ulnar nerve in situ at left cubital tunnel with      resection of medial aponeurosis of triceps.  2.  Injection of left thumb CMC joint with Depo-Medrol and lidocaine without      epinephrine.   OPERATING SURGEON:  Josephine Igo.   ASSISTANT:  Annye Rusk PA-C.   ANESTHESIA:  General by LMA, supervising anesthesiologist Dr. Jacklynn Bue.   INDICATIONS:  Nicole Maxwell is a 65 year old woman referred through the  courtesy of Dr. Renaye Rakers for evaluation and management of left hand  numbness.   She has a history of increasing numbness in her left ring and small fingers  with loss of grip strength and pinch stamina. She has type 2 diabetes. We  have followed her in the past for other orthopedic predicaments.   Clinical examination revealed weakness of pinch prehension and grasp, a  positive Froment sign and marked ulnar intrinsic and extrinsic weakness.   Electrodiagnostic studies by Dr. Johna Roles revealed a conduction velocity of  only 32 meters per second across the left cubital tunnel.   Due to failure to respond to nonoperative measures, she is brought to the  operating room at this time for decompression of her left ulnar nerve in  situ, anticipating management of the triceps aponeurosis to prevent  instability of the ulnar nerve.   The second problem brought to our attention in the holding area prior  surgery was chronic pain at the base of her left thumb. She had shouldering  of her CMC joint, pain with CMC grind and plain films obtained in the office  previously did document narrowing and marginal osteophyte formation.   After informed consent, Ms. Matt requested that we proceed with injection  of her left thumb CMC joint while she was under general anesthesia.   After questions were invited, answered in the holding area, she is now  brought to the operating room, anticipating the aforementioned procedures.   PROCEDURE:  Nicole Maxwell is brought to the operating room and placed in supine  position on the operating table.   Under the supervision of Dr. Jacklynn Bue general anesthesia by LMA technique  was induced.   The left arm was prepped with Betadine soap solution, sterilely draped. A  pneumatic tourniquet was applied to proximal left brachium. Following  exsanguination of the left  arm with an Esmarch bandage, an arterial  tourniquet on the proximal brachium was inflated to 220 mmHg.   Procedure commenced with a 2.5 cm incision directly over the path of the  ulnar nerve posterior to the medial epicondyle. The subcutaneous tissues  were carefully divided taking care to identify and spare the posterior  branch of the medial antebrachial cutaneous nerve. The ulnar nerve was  identified posterior to the epicondyle. The arcuate ligament was released as  was the fascia at the head of flexor carpi ulnaris. Osborne's band was  identified and released. The brachial fascia was released 6 cm proximal to  the epicondyle including the arcade of Struthers.  Hemostasis achieved with  bipolar cautery. The nerve was decompressed 7 cm distal to the epicondyle by  release of the fascia and muscle of the flexor carpi ulnaris and  release of  fascial bands directly over the nerve.   The triceps aponeurosis was covering the nerve. A triangular portion of the  aponeurosis was resected with a cutting cautery. Hemostasis achieved with  bipolar cautery.   The wound was then inspected for bleeding points. None were identified. The  nerve was stable through a range of zero to 140 degrees of elbow flexion.  The wound was then repaired with intradermal 3-0 Prolene and Steri-Strips  followed by application of a Tegaderm dressing.   Attention then directed to the left thumb. While traction was being applied,  the landmarks were palpated and a mixture of 50% Depo-Medrol 40 mg/mL and 1%  plain lidocaine was injected for a total volume of 1.25 mL into the [facility]  joint utilizing a 27 gauge needle. This was uncomplicated.   Ms. Furney elbow was then dressed with sterile gauze and Ace wrap. A Band-  Aid was applied to the thumb. We will encourage immediate elbow range of  motion exercises.   She was awakened from general anesthesia, transferred recovery room with  stable vital signs. She will be discharged to care of her family to maintain  her routine medication profile and in addition is provided Percocet 5  milligrams one p.o. every 4 to 6 hours as needed for pain, 20 tablets  without refill.   She was provided 1 gram of Ancef as IV prophylactic antibiotic  preoperatively.      Katy Fitch Sypher, M.D.  Electronically Signed     RVS/MEDQ  D:  02/23/2005  T:  02/23/2005  Job:  [id number]   cc:   Renaye Rakers, M.D.  Fax: [phone]

## 2010-07-09 ENCOUNTER — Other Ambulatory Visit (HOSPITAL_COMMUNITY): Payer: Self-pay | Admitting: Neurological Surgery

## 2010-07-09 DIAGNOSIS — M47816 Spondylosis without myelopathy or radiculopathy, lumbar region: Secondary | ICD-10-CM

## 2010-07-15 ENCOUNTER — Ambulatory Visit (HOSPITAL_COMMUNITY)
Admission: RE | Admit: 2010-07-15 | Discharge: 2010-07-15 | Disposition: A | Discharge status: Home / Self Care | Payer: Medicare Other | Source: Ambulatory Visit | Attending: Neurological Surgery | Admitting: Neurological Surgery

## 2010-07-15 DIAGNOSIS — M51379 Other intervertebral disc degeneration, lumbosacral region without mention of lumbar back pain or lower extremity pain: Secondary | ICD-10-CM | POA: Insufficient documentation

## 2010-07-15 DIAGNOSIS — Z79899 Other long term (current) drug therapy: Secondary | ICD-10-CM | POA: Insufficient documentation

## 2010-07-15 DIAGNOSIS — Z7982 Long term (current) use of aspirin: Secondary | ICD-10-CM | POA: Insufficient documentation

## 2010-07-15 DIAGNOSIS — I700 Atherosclerosis of aorta: Secondary | ICD-10-CM | POA: Insufficient documentation

## 2010-07-15 DIAGNOSIS — I708 Atherosclerosis of other arteries: Secondary | ICD-10-CM | POA: Insufficient documentation

## 2010-07-15 DIAGNOSIS — M5137 Other intervertebral disc degeneration, lumbosacral region: Secondary | ICD-10-CM | POA: Insufficient documentation

## 2010-07-15 DIAGNOSIS — M47816 Spondylosis without myelopathy or radiculopathy, lumbar region: Secondary | ICD-10-CM

## 2010-07-15 LAB — GLUCOSE, CAPILLARY
Glucose-Capillary: 122 mg/dL — ABNORMAL HIGH (ref 70–99)
Glucose-Capillary: 142 mg/dL — ABNORMAL HIGH (ref 70–99)

## 2010-07-15 MED ORDER — IOHEXOL 180 MG/ML  SOLN
20.0000 mL | Freq: Once | INTRAMUSCULAR | Status: AC | PRN
  Administered 2010-07-15: 20 mL via INTRATHECAL

## 2010-12-15 ENCOUNTER — Other Ambulatory Visit: Payer: Self-pay | Admitting: Neurological Surgery

## 2011-01-01 ENCOUNTER — Encounter (HOSPITAL_COMMUNITY): Payer: Self-pay | Admitting: Pharmacy Technician

## 2011-01-12 ENCOUNTER — Encounter (HOSPITAL_COMMUNITY): Payer: Self-pay

## 2011-01-12 ENCOUNTER — Encounter (HOSPITAL_COMMUNITY)
Admission: RE | Admit: 2011-01-12 | Discharge: 2011-01-12 | Disposition: A | Discharge status: Home / Self Care | Payer: Medicare Other | Source: Ambulatory Visit | Attending: Neurological Surgery | Admitting: Neurological Surgery

## 2011-01-12 DIAGNOSIS — Z01810 Encounter for preprocedural cardiovascular examination: Secondary | ICD-10-CM | POA: Insufficient documentation

## 2011-01-12 DIAGNOSIS — Z01812 Encounter for preprocedural laboratory examination: Secondary | ICD-10-CM | POA: Insufficient documentation

## 2011-01-12 DIAGNOSIS — I10 Essential (primary) hypertension: Secondary | ICD-10-CM | POA: Insufficient documentation

## 2011-01-12 DIAGNOSIS — Z01818 Encounter for other preprocedural examination: Secondary | ICD-10-CM | POA: Insufficient documentation

## 2011-01-12 HISTORY — DX: Shortness of breath: R06.02

## 2011-01-12 HISTORY — DX: Myoneural disorder, unspecified: G70.9

## 2011-01-12 HISTORY — DX: Unspecified osteoarthritis, unspecified site: M19.90

## 2011-01-12 LAB — BASIC METABOLIC PANEL
BUN: 17 mg/dL (ref 6–23)
Creatinine, Ser: 0.77 mg/dL (ref 0.50–1.10)
GFR calc Af Amer: >90 mL/min (ref >90)
GFR calc non Af Amer: 86 mL/min — ABNORMAL LOW (ref >90)
Glucose, Bld: 92 mg/dL (ref 70–99)

## 2011-01-12 LAB — CBC
MCH: 27.4 pg (ref 26.0–34.0)
Platelets: 195 10*3/uL (ref 150–400)

## 2011-01-12 NOTE — Pre-Procedure Instructions (Signed)
20 Nicole Maxwell  01/12/2011   Your procedure is scheduled on:  Thursday, December 27th.  Report to [facility] at 5:30 AM.  Call this number if you have problems the morning of surgery: [phone]   Remember:   Do not eat food:After Midnight.  May have clear liquids: up to 4 Hours before arrival or1:30.  Clear liquids include soda, tea, black coffee, apple or grape juice, broth.  Take these medicines the morning of surgery with A SIP OF WATER: Xanax and use Inhalers.  Bring Albuterol.  May use Nebulizer prior to arrival.   Do not wear jewelry, make-up or nail polish.  Do not wear lotions, powders, or perfumes. You may wear deodorant.  Do not shave 48 hours prior to surgery.  Do not bring valuables to the hospital.  Contacts, dentures or bridgework may not be worn into surgery.  Leave suitcase in the car. After surgery it may be brought to your room.  For patients admitted to the hospital, checkout time is 11:00 AM the day of discharge.   Patients discharged the day of surgery will not be allowed to drive home.  Name and phone number of your driver: NA  Special Instructions: CHG Shower Use Special Wash: 1/2 bottle night before surgery and 1/2 bottle morning of surgery.   Please read over the following fact sheets that you were given: Pain Booklet, Coughing and Deep Breathing, MRSA Information and Surgical Site Infection Prevention

## 2011-02-08 ENCOUNTER — Ambulatory Visit (HOSPITAL_COMMUNITY): Admission: RE | Admit: 2011-02-08 | Payer: Medicare Other | Source: Ambulatory Visit | Admitting: Neurological Surgery

## 2011-02-08 ENCOUNTER — Encounter (HOSPITAL_COMMUNITY): Admission: RE | Payer: Self-pay | Source: Ambulatory Visit

## 2011-02-08 SURGERY — LUMBAR LAMINECTOMY/DECOMPRESSION MICRODISCECTOMY
Anesthesia: General | Site: Back | Laterality: Right

## 2011-02-25 ENCOUNTER — Ambulatory Visit (INDEPENDENT_AMBULATORY_CARE_PROVIDER_SITE_OTHER): Payer: Medicare Other | Admitting: Internal Medicine

## 2011-02-25 ENCOUNTER — Encounter: Payer: Self-pay | Admitting: Internal Medicine

## 2011-02-25 DIAGNOSIS — J449 Chronic obstructive pulmonary disease, unspecified: Secondary | ICD-10-CM

## 2011-02-25 DIAGNOSIS — R05 Cough: Secondary | ICD-10-CM

## 2011-02-25 DIAGNOSIS — Z01811 Encounter for preprocedural respiratory examination: Secondary | ICD-10-CM

## 2011-02-25 DIAGNOSIS — R079 Chest pain, unspecified: Secondary | ICD-10-CM

## 2011-02-25 DIAGNOSIS — F17200 Nicotine dependence, unspecified, uncomplicated: Secondary | ICD-10-CM

## 2011-02-25 DIAGNOSIS — Z720 Tobacco use: Secondary | ICD-10-CM

## 2011-02-25 DIAGNOSIS — R059 Cough, unspecified: Secondary | ICD-10-CM

## 2011-02-25 DIAGNOSIS — J4489 Other specified chronic obstructive pulmonary disease: Secondary | ICD-10-CM

## 2011-02-25 MED ORDER — FLUTICASONE-SALMETEROL 250-50 MCG/DOSE IN AEPB: 1.0000 | INHALATION_SPRAY | Freq: Two times a day (BID) | RESPIRATORY_TRACT | Status: DC

## 2011-02-25 NOTE — Patient Instructions (Signed)
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

## 2011-02-25 NOTE — Progress Notes (Signed)
Subjective:    Patient ID: Nicole Maxwell, female    DOB: 01/25/1946, 66 y.o.   MRN: [id number]  HPI IOV 04/29/10 DYSPNEA This is a chronic (New complaint for this OV. Insidious onset. Started several years ago) problem. The current episode started more than 1 year ago. The problem occurs intermittently. The problem has been gradually worsening (getting worse past 9 months). Associated symptoms include PND, sputum production and wheezing. Pertinent negatives include no abdominal pain, chest pain, claudication, coryza, ear pain, fever, headaches, leg pain, neck pain, orthopnea, rash, rhinorrhea, sore throat, swollen glands, syncope or vomiting. The symptoms are aggravated by exercise, any activity, emotional upset, smoke, lying flat, weather changes, pollens and odors (Activities like moving across room can make her dyspneic but distance is variable). Associated symptoms comments: Wakes up in morning with bad headache but denies snoring. 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 Heavy tobacco abuse +. 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 Sputum is clear and small amount . Risk factors include smoking. She has tried beta agonist inhalers, steroid inhalers and prescription cough suppressants (tried budesonide neb and brovana for 6-10 months but it made dyspnea worse, quit taking it March 2012. Been on O2 for 10 months but using it prn only but does find it helps. Has been  on hydrocodone - homatropine and it helps cough. Spiriva also helps) for the symptoms. The treatment provided mild (only spiriva and cough suppressant helps somewhat) relief. Her past medical history is significant for chronic lung disease and COPD. There is no history of allergies, aspirin allergies, asthma, bronchiolitis, CAD, DVT, a heart failure, PE, pneumonia or a recent surgery.   Also, central chest pain x 5 months that comes on with exertion and relieved by rest. No radiation. Rates  pain as moderate-severe but not sharp in quality. Associated with dyspnea +  CXR 07/26/2009 shows clear lung fields but hyperinflation. Recent CXR at PMD office report is same. 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 Has hx of negative treadmill cardiac stress test many years ago  #COPD  - you have very severe copd with asthma component  - stop pulmicort and brovana nebs because of intolerance  - continue spiriva 1 puff daily  - start QVAR 2 puff bid (take sample, show technique)  - use oxygen with walking and sleep  #SMOKING  - please write down an action plan with your husband  - please get booklet about quit smoking from our office  - please get contact # for smoking cessation class at [facility]  #Chest Pain  - am referring you to cardiology  #Followup  - 1 month  - at followup will do spirometry and check alpha 1 genetic test  - based on cardiology followup I will refer you to pulmonary rehabilitation  OV 02/25/2011 [place] COPD, chest pain, smoking and new complaint of preoperative pulmonary evaluation prior to back surgery by dr Danielle Dess. She did not followup as advised within 1 month of her last visit in APril 2012. In interim saw cardiology for chest pain and Doubtamine stress echo 05/12/10 - normal. The chest pain has resolved. In terms of copd: doing stable on spiriva with continued class 2-3 dyspnea. Has not had any admits for aecopd. Cleda Daub today - fev1 0.74L/37%, RAtop 44 - shows gold stage 3 copd. In  terms of smoking: stil smoking. 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 Also, she is due to have spine surgery and pmd felt is best she get pulmonary preop eval (per her hx)   Past, Family, Social reviewed: stress echo normal. Back pain  Needs pre op clearance ([facility])  Review of Systems  Constitutional: Negative for fever and unexpected weight change.  HENT: Negative for ear pain,  nosebleeds, congestion, sore throat, rhinorrhea, sneezing, trouble swallowing, dental problem, postnasal drip and sinus pressure.   Eyes: Negative for redness and itching.  Respiratory: Positive for cough and shortness of breath. Negative for chest tightness and wheezing.   Cardiovascular: Negative for palpitations and leg swelling.  Gastrointestinal: Negative for nausea and vomiting.  Genitourinary: Negative for dysuria.  Musculoskeletal: Negative for joint swelling.  Skin: Negative for rash.  Neurological: Negative for headaches.  Hematological: Does not bruise/bleed easily.  Psychiatric/Behavioral: Negative for dysphoric mood. The patient is nervous/anxious.        Objective:   Physical Exam Vitals reviewed. Constitutional: She is oriented to person, place, and time. She appears well-developed and well-nourished. No distress.  . Deconditioned looking . Body mass index is 25.32 kg/(m^2).  HENT:  Head: Normocephalic and atraumatic.  Right Ear: External ear normal.  Left Ear: External ear normal.  Mouth/Throat: Oropharynx is clear and moist. No oropharyngeal exudate.  Eyes: Conjunctivae and EOM are normal. Pupils are equal, round, and reactive to light. Right eye exhibits no discharge. Left eye exhibits no discharge. No scleral icterus.  Neck: Normal range of motion. Neck supple. No JVD present. No tracheal deviation present. No thyromegaly present.  Cardiovascular: Normal rate, regular rhythm, normal heart sounds and intact distal pulses.  Exam reveals no gallop and no friction rub.   No murmur heard. Pulmonary/Chest: Effort normal. No respiratory distress. She has no wheezes. She has no rales. She exhibits no tenderness.       Prolonged expiration No wheeze ful sentences desatrated after walking 2 laps  Abdominal: Soft. Bowel sounds are normal. She exhibits no distension and no mass. There is no tenderness. There is no rebound and no guarding.  Musculoskeletal: Normal range of  motion. She exhibits no edema and no tenderness.  Lymphadenopathy:    She has no cervical adenopathy.  Neurological: She is alert and oriented to person, place, and time. She has normal reflexes. No cranial nerve deficit. She exhibits normal muscle tone. Coordination normal.  Skin: Skin is warm and dry. No rash noted. She is not diaphoretic. No erythema. No pallor.  Psychiatric: She has a normal mood and affect. Her behavior is normal. Judgment and thought content normal.                Assessment & Plan:

## 2011-02-26 ENCOUNTER — Telehealth: Payer: Self-pay | Admitting: Internal Medicine

## 2011-02-26 NOTE — Telephone Encounter (Signed)
Spoke with pt. She states unsure how many times per day to use advair-MR put 1 puff bid on her AVS and she of course did not know what this meant. I advised that this should be done 1 puff twice daily and pt verbalized understanding and states nothing further needed.

## 2011-02-28 ENCOUNTER — Encounter: Payer: Self-pay | Admitting: Internal Medicine

## 2011-02-28 DIAGNOSIS — Z01811 Encounter for preprocedural respiratory examination: Secondary | ICD-10-CM | POA: Insufficient documentation

## 2011-02-28 NOTE — Assessment & Plan Note (Signed)
Stable gold stage 3-4 copd PLAN #COPD   - this is stable but you have very severe lung damage copd  - do alpha 1 genetic test   - continue spiriva  - start advair 250/50 1 puff bid - take sample, script and show technique  - refer pulmonary rehab - glad you had pneumonia shot and flu shot (We will document it)

## 2011-02-28 NOTE — Assessment & Plan Note (Signed)
She is due to have spine surgery for back ache by Dr Danielle Dess. Currently on hold due to scheduling issues. I told her that given very severe copd that there is definite though overall low-moderate risk of failure to liberate off vent, prolonged hospitalization and pneumonia following spine surgery. STrongly advised to stay off cigarettes for 8 weeks prior to surgery if possible. Will add advair to her spiriva to help optimize lung function for surgery

## 2011-02-28 NOTE — Assessment & Plan Note (Signed)
Counseled for 3 min to quit. She is struggling and feels she cannot. Advised that risk for surgery pulmonary complications will be lowered if she were to quit smoking for 2 months. She seemed surprised and says she will try

## 2011-02-28 NOTE — Assessment & Plan Note (Signed)
Resolved. Normal dobutamine stress echo 2012

## 2011-03-09 ENCOUNTER — Telehealth: Payer: Self-pay | Admitting: Internal Medicine

## 2011-03-09 NOTE — Telephone Encounter (Signed)
I spoke with pt and she stated she wanted to make sure that we did have brovana on her allergy list. I advised pt that we did and she did not need anything further

## 2011-03-11 ENCOUNTER — Telehealth: Payer: Self-pay | Admitting: Internal Medicine

## 2011-03-11 MED ORDER — BUDESONIDE-FORMOTEROL FUMARATE 80-4.5 MCG/ACT IN AERO: 2.0000 | INHALATION_SPRAY | Freq: Two times a day (BID) | RESPIRATORY_TRACT | Status: DC

## 2011-03-11 NOTE — Telephone Encounter (Signed)
I spoke with pt and she states she will try symbicort and see how she does. I informed pt on how to use inhaler and the directions. She voiced her understanding

## 2011-03-11 NOTE — Telephone Encounter (Signed)
Ok to stop advair  But I would like her to try symbicort if she can 80/4.5  Puff bid  If she refuses can go to qvar 2 puff bid

## 2011-03-11 NOTE — Telephone Encounter (Signed)
I spoke with pt and she states the advair does not help with her SOB and causes her to have a lot of cough while taking this. Pt stated she did better on qvar. Pt is requesting to stop the advair. Please advise Dr. Marchelle Gearing, thanks

## 2011-03-25 ENCOUNTER — Ambulatory Visit: Payer: Medicare Other | Admitting: Internal Medicine

## 2011-04-09 ENCOUNTER — Ambulatory Visit (INDEPENDENT_AMBULATORY_CARE_PROVIDER_SITE_OTHER): Payer: Medicare Other | Admitting: Internal Medicine

## 2011-04-09 ENCOUNTER — Encounter: Payer: Self-pay | Admitting: Internal Medicine

## 2011-04-09 DIAGNOSIS — Z720 Tobacco use: Secondary | ICD-10-CM

## 2011-04-09 DIAGNOSIS — J449 Chronic obstructive pulmonary disease, unspecified: Secondary | ICD-10-CM

## 2011-04-09 DIAGNOSIS — F17200 Nicotine dependence, unspecified, uncomplicated: Secondary | ICD-10-CM

## 2011-04-09 DIAGNOSIS — Z01811 Encounter for preprocedural respiratory examination: Secondary | ICD-10-CM

## 2011-04-09 NOTE — Patient Instructions (Signed)
#  COPD   - this is stable but you have very severe lung damage copd  - do alpha 1 genetic test   - continue spiriva  - try sample symbicort 2 puff twice daily   - refer pulmonary rehab - we do not know why they have not called you yet but will check if our referral was done  #SMoking  - need to quit smoking. Your husband and you should do this together  #preop and followup - I will talk to Dr Danielle Dess to find out status  #Followup  - 8 weeks or sooner if needed

## 2011-04-09 NOTE — Progress Notes (Addendum)
Subjective:    Patient ID: Nicole Maxwell, female    DOB: Oct 06, 1945, 66 y.o.   MRN: [id number]  HPI HPI IOV 04/29/10 DYSPNEA This is a chronic (New complaint for this OV. Insidious onset. Started several years ago) problem. The current episode started more than 1 year ago. The problem occurs intermittently. The problem has been gradually worsening (getting worse past 9 months). Associated symptoms include PND, sputum production and wheezing. Pertinent negatives include no abdominal pain, chest pain, claudication, coryza, ear pain, fever, headaches, leg pain, neck pain, orthopnea, rash, rhinorrhea, sore throat, swollen glands, syncope or vomiting. The symptoms are aggravated by exercise, any activity, emotional upset, smoke, lying flat, weather changes, pollens and odors (Activities like moving across room can make her dyspneic but distance is variable). Associated symptoms comments: Wakes up in morning with bad headache but denies snoring. 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 Heavy tobacco abuse +. 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 Sputum is clear and small amount . Risk factors include smoking. She has tried beta agonist inhalers, steroid inhalers and prescription cough suppressants (tried budesonide neb and brovana for 6-10 months but it made dyspnea worse, quit taking it March 2012. Been on O2 for 10 months but using it prn only but does find it helps. Has been  on hydrocodone - homatropine and it helps cough. Spiriva also helps) for the symptoms. The treatment provided mild (only spiriva and cough suppressant helps somewhat) relief. Her past medical history is significant for chronic lung disease and COPD. There is no history of allergies, aspirin allergies, asthma, bronchiolitis, CAD, DVT, a heart failure, PE, pneumonia or a recent surgery.   Also, central chest pain x 5 months that comes on with exertion and relieved by rest. No radiation.  Rates pain as moderate-severe but not sharp in quality. Associated with dyspnea +  CXR 07/26/2009 shows clear lung fields but hyperinflation. Recent CXR at PMD office report is same. 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 Has hx of negative treadmill cardiac stress test many years ago  #COPD  - you have very severe copd with asthma component  - stop pulmicort and brovana nebs because of intolerance  - continue spiriva 1 puff daily  - start QVAR 2 puff bid (take sample, show technique)  - use oxygen with walking and sleep  #SMOKING  - please write down an action plan with your husband  - please get booklet about quit smoking from our office  - please get contact # for smoking cessation class at [facility]  #Chest Pain  - am referring you to cardiology  #Followup  - 1 month  - at followup will do spirometry and check alpha 1 genetic test  - based on cardiology followup I will refer you to pulmonary rehabilitation  OV 02/25/2011 [place] COPD, chest pain, smoking and new complaint of preoperative pulmonary evaluation prior to back surgery by dr Danielle Dess. She did not followup as advised within 1 month of her last visit in APril 2012. In interim saw cardiology for chest pain and Doubtamine stress echo 05/12/10 - normal. The chest pain has resolved. In terms of copd: doing stable on spiriva with continued class 2-3 dyspnea. Has not had any admits for aecopd. Cleda Daub today - fev1 0.74L/37%, RAtop 44 - shows gold stage 3 copd.  In terms of smoking: stil smoking. 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 Also, she is due to have spine surgery and pmd felt is best she get pulmonary preop eval (per her hx).    Past, Family, Social reviewed: stress echo normal. Back pain  Needs pre op clearance ([facility])   #COPD  - this is stable but you have very severe lung damage copd  - do alpha 1 genetic test  - continue  spiriva  - start advair 250/50 1 puff bid - take sample, script and show technique  - refer pulmonary rehab  - glad you had pneumonia shot and flu shot (We will document it)  #SMoking  - need to quit smoking  #preop and followup  - quit smoking for atleast 8 weeks before surgery  - return to see me in in 1 mont with spirometry  OV 04/09/2011 Still smoking. Nicorette not helping though she is trying with it. HAS questions on e-cig.  Not had back surgery by Dr Danielle Dess yet. She is not sure why but says is because her PMD wanted her to fu with me again. Chest pains still in remission. Dyspnea unchanged. Brought on by emotional upset and by exertion like taking a shower or changing clothes. Associated with leg pains during walks.  Still taking spiriva and qvar.  Advair did not help ($45). So changed to symbicort ($45) but did not take it due to cost  Current meds   Current outpatient prescriptions:albuterol (PROVENTIL HFA;VENTOLIN HFA) 108 (90 BASE) MCG/ACT inhaler, Inhale 2 puffs into the lungs every 6 (six) hours as needed. Shortness of breath , Disp: , Rfl: ;  albuterol (PROVENTIL) (2.5 MG/3ML) 0.083% nebulizer solution, Take 2.5 mg by nebulization every 4 (four) hours as needed.  , Disp: , Rfl: ;  alprazolam (XANAX) 2 MG tablet, Take 1 mg by mouth at bedtime as needed.  , Disp: , Rfl:  aspirin 325 MG tablet, Take 325 mg by mouth daily.  , Disp: , Rfl: ;  losartan-hydrochlorothiazide (HYZAAR) 100-25 MG per tablet, Take 1 tablet by mouth daily.  , Disp: , Rfl: ;  metFORMIN (GLUCOPHAGE) 500 MG tablet, Take 500 mg by mouth 2 (two) times daily with a meal.  , Disp: , Rfl: ;  nicotine polacrilex (NICORETTE) 2 MG gum, Take 2 mg by mouth as needed., Disp: , Rfl:  QVAR 80 MCG/ACT inhaler, 2 puff twice daily. Rinse mouth after use , Disp: 3 Inhaler, Rfl: 3;  rosuvastatin (CRESTOR) 5 MG tablet, Take 5 mg by mouth daily.  , Disp: , Rfl:   Past, Family, Social reviewed: no change since last visit other than she  has not had back surgery yet for unclear reasons    Review of Systems  Constitutional: Negative for fever and unexpected weight change.  HENT: Negative for ear pain, nosebleeds, congestion, sore throat, rhinorrhea, sneezing, trouble swallowing, dental problem, postnasal drip and sinus pressure.   Eyes: Negative for redness and itching.  Respiratory: Positive for cough and shortness of breath. Negative for chest tightness and wheezing.   Cardiovascular: Negative for palpitations and leg swelling.  Gastrointestinal: Negative for nausea and vomiting.  Genitourinary: Negative for dysuria.  Musculoskeletal: Negative for joint swelling.  Skin: Negative for rash.  Neurological: Negative for headaches.  Hematological: Does not bruise/bleed easily.  Psychiatric/Behavioral: Negative for dysphoric mood. The patient is not nervous/anxious.        Objective:   Physical Exam Vitals reviewed. Constitutional: She is oriented to person, place, and time. She appears  well-developed and well-nourished. No distress.  . Deconditioned looking . Body mass index is 25.32 kg/(m^2).  HENT:  Head: Normocephalic and atraumatic.  Right Ear: External ear normal.  Left Ear: External ear normal.  Mouth/Throat: Oropharynx is clear and moist. No oropharyngeal exudate.  Eyes: Conjunctivae and EOM are normal. Pupils are equal, round, and reactive to light. Right eye exhibits no discharge. Left eye exhibits no discharge. No scleral icterus.  Neck: Normal range of motion. Neck supple. No JVD present. No tracheal deviation present. No thyromegaly present.  Cardiovascular: Normal rate, regular rhythm, normal heart sounds and intact distal pulses.  Exam reveals no gallop and no friction rub.   No murmur heard. Pulmonary/Chest: Effort normal. No respiratory distress. She has no wheezes. She has no rales. She exhibits no tenderness.       Prolonged expiration No wheeze ful sentences desatrated after walking 2 laps    Abdominal: Soft. Bowel sounds are normal. She exhibits no distension and no mass. There is no tenderness. There is no rebound and no guarding.  Musculoskeletal: Normal range of motion. She exhibits no edema and no tenderness.  Lymphadenopathy:    She has no cervical adenopathy.  Neurological: She is alert and oriented to person, place, and time. She has normal reflexes. No cranial nerve deficit. She exhibits normal muscle tone. Coordination normal.  Skin: Skin is warm and dry. No rash noted. She is not diaphoretic. No erythema. No pallor.  Psychiatric: She has a normal mood and affect. Her behavior is normal. Judgment and thought content normal.             Assessment & Plan:

## 2011-04-18 ENCOUNTER — Telehealth: Payer: Self-pay | Admitting: Internal Medicine

## 2011-04-18 NOTE — Assessment & Plan Note (Signed)
#  COPD   - this is stable but you have very severe lung damage copd  - do alpha 1 genetic test   - continue spiriva  - try sample symbicort 2 puff twice daily   - refer pulmonary rehab - we do not know why they have not called you yet but will check if our referral was done  #

## 2011-04-18 NOTE — Telephone Encounter (Signed)
Dr Danielle Dess  Patient wondering why she stil lhas not had her spine surgery. Is it any concern over pulmonary risk ?  Thanks  [employer]

## 2011-04-18 NOTE — Assessment & Plan Note (Signed)
SMoking  - need to quit smoking. Your husband and you should do this together  #preop and followup - I will talk to Dr Danielle Dess to find out status  #Followup  - 8 weeks or sooner if needed

## 2011-04-18 NOTE — Assessment & Plan Note (Signed)
She is struggling with quitting. Husband also smokes. I advised both of them for 3 minutes for best success both of them quit. They are fully aware of the terribe consequences of smoking  3 min counseling about smoking

## 2011-04-29 ENCOUNTER — Other Ambulatory Visit (HOSPITAL_COMMUNITY): Payer: Self-pay | Admitting: *Deleted

## 2011-04-29 ENCOUNTER — Other Ambulatory Visit (HOSPITAL_COMMUNITY): Payer: Self-pay | Admitting: Internal Medicine

## 2011-04-29 ENCOUNTER — Other Ambulatory Visit (HOSPITAL_COMMUNITY): Payer: Self-pay | Admitting: Family Medicine

## 2011-04-29 DIAGNOSIS — Z1231 Encounter for screening mammogram for malignant neoplasm of breast: Secondary | ICD-10-CM

## 2011-05-21 ENCOUNTER — Telehealth: Payer: Self-pay | Admitting: Internal Medicine

## 2011-05-21 MED ORDER — BUDESONIDE-FORMOTEROL FUMARATE 160-4.5 MCG/ACT IN AERO: 2.0000 | INHALATION_SPRAY | Freq: Two times a day (BID) | RESPIRATORY_TRACT | Status: DC

## 2011-05-21 NOTE — Telephone Encounter (Signed)
Spoke with pt and she states symbicort helps and she is requesting that we send rx to pharm. Rx was sent and the pt states nothing further needed.

## 2011-05-26 ENCOUNTER — Ambulatory Visit (HOSPITAL_COMMUNITY)
Admission: RE | Admit: 2011-05-26 | Discharge: 2011-05-26 | Disposition: A | Discharge status: Home / Self Care | Payer: Medicare Other | Source: Ambulatory Visit | Attending: Family Medicine | Admitting: Family Medicine

## 2011-05-26 DIAGNOSIS — Z1231 Encounter for screening mammogram for malignant neoplasm of breast: Secondary | ICD-10-CM | POA: Insufficient documentation

## 2011-06-11 ENCOUNTER — Ambulatory Visit (INDEPENDENT_AMBULATORY_CARE_PROVIDER_SITE_OTHER): Payer: Medicare Other | Admitting: Internal Medicine

## 2011-06-11 ENCOUNTER — Encounter: Payer: Self-pay | Admitting: Internal Medicine

## 2011-06-11 VITALS — BP 110/60 | HR 79 | Temp 97.4°F | Ht 64.0 in | Wt 151.4 lb

## 2011-06-11 DIAGNOSIS — J449 Chronic obstructive pulmonary disease, unspecified: Secondary | ICD-10-CM

## 2011-06-11 DIAGNOSIS — Z720 Tobacco use: Secondary | ICD-10-CM

## 2011-06-11 DIAGNOSIS — Z01811 Encounter for preprocedural respiratory examination: Secondary | ICD-10-CM

## 2011-06-11 DIAGNOSIS — F17200 Nicotine dependence, unspecified, uncomplicated: Secondary | ICD-10-CM

## 2011-06-11 MED ORDER — IPRATROPIUM BROMIDE 0.02 % IN SOLN: 500.0000 ug | Freq: Four times a day (QID) | RESPIRATORY_TRACT | Status: DC

## 2011-06-11 MED ORDER — BECLOMETHASONE DIPROPIONATE 80 MCG/ACT IN AERS: 2.0000 | INHALATION_SPRAY | Freq: Two times a day (BID) | RESPIRATORY_TRACT | Status: DC

## 2011-06-11 MED ORDER — ALBUTEROL SULFATE (2.5 MG/3ML) 0.083% IN NEBU: 2.5000 mg | INHALATION_SOLUTION | Freq: Four times a day (QID) | RESPIRATORY_TRACT | Status: DC

## 2011-06-11 NOTE — Progress Notes (Signed)
Subjective:    Patient ID: Nicole Maxwell, female    DOB: [birth year], 66 y.o.   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 Nicorette not helping though she is trying with it. HAS questions on e-cig.  Not had back surgery by Dr Danielle Dess yet. She is not sure why but says is because her PMD wanted her to fu with me again. Chest pains still in remission. Dyspnea unchanged. Brought on by emotional upset and by exertion like taking a shower or changing clothes. Associated with leg pains during walks.  Still taking spiriva and qvar.  Advair did not help ($45). 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 Your husband and you should do this together  #preop and followup - I will talk to Dr Danielle Dess to find out status  #Followup  - 8 weeks or sooner if needed   OV 06/11/2011  - no surgery yet; unclear why. I had sent epic noted to Dr Danielle Dess but  had not heard back  - c/o cost of medication for copd inhalers - copd stable - cAT score 23 - still smoking and struggling to quit    CAT COPD Symptom and Quality of Life Score (glax[employer] trademark)  0 (no burden) to 5 (highest burden)  Never Cough -> Cough all the time 1  No phlegm in chest -> Chest is full of phlegm 2  No chest tightness -> Chest feels very tight 1  No dyspnea for 1 flight stairs/hill -> Very dyspneic for 1 flight of stairs 5  No limitations for ADL at home -> Very limited with ADL at home 4  Confident leaving home -> Not at all confident leaving home 5  Sleep soundly -> Do not sleep soundly because of lung condition 2  Lots of Energy -> No energy at all 3  TOTAL Score (max 40)  23    Past, Family, Social reviewed: no change since last visit     Review of Systems  Constitutional: Negative for fever and unexpected weight change.  HENT: Positive for ear pain and tinnitus. Negative for nosebleeds, congestion, sore throat, rhinorrhea, sneezing, trouble swallowing, dental problem, postnasal drip and sinus pressure.   Eyes: Negative for redness and itching.  Respiratory: Positive for cough, chest  tightness, shortness of breath and wheezing.   Cardiovascular: Negative for palpitations and leg swelling.  Gastrointestinal: Negative for nausea and vomiting.  Genitourinary: Negative for dysuria.  Musculoskeletal: Negative for joint swelling.  Skin: Negative for rash.  Neurological: Negative for headaches.  Hematological: Does not bruise/bleed easily.  Psychiatric/Behavioral: Negative for dysphoric mood. The patient is not nervous/anxious.        Objective:   Physical Exam  Vitals reviewed. Constitutional: She is oriented to person, place, and time. She appears well-developed and well-nourished. No distress.  . Deconditioned looking; smells of tobacco .  Head: Normocephalic and atraumatic.  Right Ear: External ear normal.  Left Ear: External ear normal.   Mouth/Throat: Oropharynx is clear and moist. No oropharyngeal exudate.  Eyes: Conjunctivae and EOM are normal. Pupils are equal, round, and reactive to light. Right eye exhibits no discharge. Left eye exhibits no discharge. No scleral icterus.  Neck: Normal range of motion. Neck supple. No JVD present. No tracheal deviation present. No thyromegaly present.  Cardiovascular: Normal rate, regular rhythm, normal heart sounds and intact distal pulses.  Exam reveals no gallop and no friction rub.   No murmur heard. Pulmonary/Chest: Effort normal. No respiratory distress. She has no wheezes. She has no rales. She exhibits no tenderness.       Prolonged expiration No wheeze ful sentences  Abdominal: Soft. Bowel sounds are normal. She exhibits no distension and no mass. There is no tenderness. There is no rebound and no guarding.  Musculoskeletal: Normal range of motion. She exhibits no edema and no tenderness.  Lymphadenopathy:    She has no cervical adenopathy.  Neurological: She is alert and oriented to person, place, and time. She has normal reflexes. No cranial nerve deficit. She exhibits normal muscle tone. Coordination normal.  Skin: Skin is warm and dry. No rash noted. She is not diaphoretic. No erythema. No pallor.  Psychiatric: She has a normal mood and affect. Her behavior is normal. Judgment and thought content normal.              Assessment & Plan:

## 2011-06-11 NOTE — Patient Instructions (Signed)
#  COPD   - this is stable but you have very severe lung damage copd - stop spiriva and  symbicort due to cost  - start albuterol nebulizer and atrovent nebulizer 4 times daily scheduled + albuterol as needed  - take qvar 2 puff twice daily   -nurse will re- refer pulmonary rehab   #SMoking  - need to quit smoking. Your husband and you should do this together  #preop and followup - I would says moderate risk for spine surgery; I will forward note to Dr Danielle Dess  #Followup  - 12 weeks or sooner if needed - CAT score at followup

## 2011-06-12 ENCOUNTER — Encounter: Payer: Self-pay | Admitting: Internal Medicine

## 2011-06-12 NOTE — Assessment & Plan Note (Signed)
Again encouraged to quit

## 2011-06-12 NOTE — Assessment & Plan Note (Signed)
#  preop and followup - I would says moderate risk for spine surgery; I will forward note to Dr Danielle Dess  #Followup  - 12 weeks or sooner if needed - CAT score at followup

## 2011-06-12 NOTE — Assessment & Plan Note (Signed)
#  COPD   - this is stable but you have very severe lung damage copd - stop spiriva and  symbicort due to cost  - start albuterol nebulizer and atrovent nebulizer 4 times daily scheduled + albuterol as needed  - take qvar 2 puff twice daily   -nurse will re- refer pulmonary rehab

## 2011-06-18 ENCOUNTER — Telehealth: Payer: Self-pay | Admitting: Internal Medicine

## 2011-06-18 MED ORDER — ALBUTEROL SULFATE (2.5 MG/3ML) 0.083% IN NEBU: 2.5000 mg | INHALATION_SOLUTION | Freq: Four times a day (QID) | RESPIRATORY_TRACT | Status: DC

## 2011-06-18 MED ORDER — IPRATROPIUM BROMIDE 0.02 % IN SOLN: 500.0000 ug | Freq: Four times a day (QID) | RESPIRATORY_TRACT | Status: DC

## 2011-06-18 NOTE — Telephone Encounter (Signed)
I spoke with [facility] and was advised they never received rx for pt albuterol or ipratropium nebulizer medications. I advised will print out rx and fax over to them. Nothing further was needed

## 2011-08-18 ENCOUNTER — Ambulatory Visit
Admission: RE | Admit: 2011-08-18 | Discharge: 2011-08-18 | Disposition: A | Discharge status: Home / Self Care | Payer: No Typology Code available for payment source | Source: Ambulatory Visit | Attending: Family Medicine | Admitting: Family Medicine

## 2011-08-18 ENCOUNTER — Other Ambulatory Visit: Payer: Self-pay | Admitting: Family Medicine

## 2011-08-18 DIAGNOSIS — Z006 Encounter for examination for normal comparison and control in clinical research program: Secondary | ICD-10-CM

## 2011-10-08 ENCOUNTER — Ambulatory Visit (INDEPENDENT_AMBULATORY_CARE_PROVIDER_SITE_OTHER): Payer: Medicare Other | Admitting: Internal Medicine

## 2011-10-08 ENCOUNTER — Encounter: Payer: Self-pay | Admitting: Internal Medicine

## 2011-10-08 VITALS — BP 138/64 | HR 103 | Temp 98.8°F | Ht 64.0 in | Wt 149.6 lb

## 2011-10-08 DIAGNOSIS — J449 Chronic obstructive pulmonary disease, unspecified: Secondary | ICD-10-CM

## 2011-10-08 DIAGNOSIS — J4489 Other specified chronic obstructive pulmonary disease: Secondary | ICD-10-CM

## 2011-10-08 MED ORDER — BECLOMETHASONE DIPROPIONATE 80 MCG/ACT IN AERS: 2.0000 | INHALATION_SPRAY | Freq: Two times a day (BID) | RESPIRATORY_TRACT | Status: DC

## 2011-10-08 NOTE — Progress Notes (Signed)
Subjective:    Patient ID: Nicole Maxwell, female    DOB: 02/22/1945, 66 y.o.   MRN: [id number]  HPI  #COPD - Oxygen dependent since 2011  - Doubtamine stress echo 05/12/10 - normal, CXR July 2011 - clear  - PFTs 04/13/2010 - shows Gold stage 4 COPD with BD response/asthma component (fev1 0.5L/29%, 10% BD response on fev1 and 38% on FC), DLCO 27%)  - Spirometry Jan 2013 - fev1 0.74L/37%, RAtop 44 - shows gold stage 3 copd  - Class 2-3 exertional dyspnea with precipitants emotion, smoke, lying flat, weather change, pollen, odors  - Chronic cough with white sputjum  - CAT score - 23 in May 2013  #Smoking  #Chronic back pain - s/p lumbar fusion surgery  #Preop pulmonary evaluatio prior to spine surgery - 2013  #There is no history of allergies, aspirin allergies, asthma, bronchiolitis, CAD, DVT, a heart failure, PE, pneumonia  OV 04/09/2011 Still smoking. Nicorette not helping though she is trying with it. HAS questions on e-cig.  Not had back surgery by Dr Danielle Dess yet. She is not sure why but says is because her PMD wanted her to fu with me again. Chest pains still in remission. Dyspnea unchanged. Brought on by emotional upset and by exertion like taking a shower or changing clothes. Associated with leg pains during walks.  Still taking spiriva and qvar.  Advair did not help ($45). 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 Your husband and you should do this together  #preop and followup - I will talk to Dr Danielle Dess to find out status  #Followup  - 8 weeks or sooner if needed   OV 06/11/2011  - no surgery yet; unclear why. I had sent epic note to Dr Danielle Dess but  had not heard back  - c/o cost of medication for copd inhalers - copd stable - cAT score 23 - still smoking and struggling to quit  Past, Family, Social reviewed: no change since last visit  REC #COPD   - this is stable but you have very severe lung damage copd - stop spiriva and  symbicort due to cost  - start albuterol nebulizer and atrovent nebulizer 4 times daily scheduled + albuterol as needed  - take qvar 2 puff twice daily   -nurse will re- refer pulmonary rehab   #SMoking  - need to quit smoking. Your husband and you should do this together  #preop and followup - I would says moderate risk for spine surgery; I will forward note to Dr Danielle Dess  #Followup  - 12 weeks or sooner if needed - CAT score at followup  OV 10/08/2011  - smoking: cut down but social stress and so finding it tough to quit  - neurosurgery: she is reluctant to have it due to moderatre pulm reisk  - copd: stable to improved symptoms. CAT score down from 23 to 18 but finding it tough to afford medications. In addition she is also confused about her MDIs. She is not following instructions from prior OV  CAT COPD Symptom and Quality of Life Score (glax[employer]ith kline trademark)  06/11/11 10/08/2011   Never Cough -> Cough all the time 1 1  No phlegm in chest -> Chest is full of phlegm 2 1  No chest tightness -> Chest feels very tight 1 1  No dyspnea for 1 flight stairs/hill -> Very dyspneic for 1 flight of stairs 5 4  No limitations for ADL at home -> Very limited with ADL at home 4 4  Confident leaving home -> Not at all confident leaving home 5 1  Sleep soundly -> Do not sleep soundly because of lung condition 2 3  Lots of Energy -> No energy at all 3 3  TOTAL Score (max 40)  23 18    Past, Family, Social reviewed: no change since last visit except tremoundous 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    Review of Systems  Constitutional: Negative for fever, chills, diaphoresis,  activity change, appetite change, fatigue and unexpected weight change.  HENT: Negative for hearing loss, ear pain, nosebleeds, congestion, sore throat, facial swelling, rhinorrhea, sneezing, mouth sores, trouble swallowing, neck pain, neck stiffness, dental problem, voice change, postnasal drip, sinus pressure, tinnitus and ear discharge.   Eyes: Negative for photophobia, discharge, itching and visual disturbance.  Respiratory: Negative for apnea, cough, choking, chest tightness, shortness of breath, wheezing and stridor.   Cardiovascular: Negative for chest pain, palpitations and leg swelling.  Gastrointestinal: Negative for nausea, vomiting, abdominal pain, constipation, blood in stool and abdominal distention.  Genitourinary: Negative for dysuria, urgency, frequency, hematuria, flank pain, decreased urine volume and difficulty urinating.  Musculoskeletal: Negative for myalgias, back pain, joint swelling, arthralgias and gait problem.  Skin: Negative for color change, pallor and rash.  Neurological: Negative for dizziness, tremors, seizures, syncope, speech difficulty, weakness, light-headedness, numbness and headaches.  Hematological: Negative for adenopathy. Does not bruise/bleed easily.  Psychiatric/Behavioral: Negative for confusion, disturbed wake/sleep cycle and agitation. The patient is not nervous/anxious.        Objective:   Physical Exam    Vitals reviewed. Constitutional: She is oriented to person, place, and time. She appears well-developed and well-nourished. No distress.  . Deconditioned looking; smells of tobacco .  Head: Normocephalic and atraumatic.  Right Ear: External ear normal.  Left Ear: External ear normal.  Mouth/Throat: Oropharynx is clear and moist. No oropharyngeal exudate.  Eyes: Conjunctivae and EOM are normal. Pupils are equal, round, and reactive to light. Right eye exhibits no discharge. Left eye exhibits no discharge. No scleral icterus.  Neck: Normal  range of motion. Neck supple. No JVD present. No tracheal deviation present. No thyromegaly present.  Cardiovascular: Normal rate, regular rhythm, normal heart sounds and intact distal pulses.  Exam reveals no gallop and no friction rub.   No murmur heard. Pulmonary/Chest: Effort normal. No respiratory distress. She has no wheezes. She has no rales. She exhibits no tenderness.       Prolonged expiration No wheeze ful sentences  Abdominal: Soft. Bowel sounds are normal. She exhibits no distension and no mass. There is no tenderness. There is no rebound and no guarding.  Musculoskeletal: Normal range of motion. She exhibits no edema and no tenderness.  Lymphadenopathy:    She has no cervical adenopathy.  Neurological: She is alert and oriented to person, place, and time. She has normal reflexes. No cranial nerve deficit. She exhibits normal muscle tone. Coordination normal.  Skin: Skin is warm and dry. No rash noted. She is not diaphoretic. No  erythema. No pallor.  Psychiatric: She has a normal mood and affect. Her behavior is normal. Judgment and thought content normal.             Assessment & Plan:

## 2011-10-08 NOTE — Assessment & Plan Note (Signed)
#  COPD   - this is stable but you have very severe lung damage copd - IN addition you are very confused about your medications. You need a medicine calendar - continue albuterol nebulizer and atrovent nebulizer 4 times daily scheduled + albuterol as needed  - continue qvar 2 puff twice daily (take some samples to help offset cost)   -make sure you call [facility] and attend rehab  - also I will have you see NP Tammy ASAP for med calendar and folowup   #SMoking  - need to quit smoking. Your husband and you should do this together - participation in rehab will help quit smoking    #Followup  - ASAP with Tammy PArrett for med calendar and copd followup - CAT score at followup

## 2011-10-08 NOTE — Patient Instructions (Addendum)
#  COPD   - this is stable but you have very severe lung damage copd - IN addition you are very confused about your medications. You need a medicine calendar - continue albuterol nebulizer and atrovent nebulizer 4 times daily scheduled + albuterol as needed  - continue qvar 2 puff twice daily (take some samples to help offset cost)   -make sure you call [facility] and attend rehab  - also I will have you see NP Tammy ASAP for med calendar and folowup   #SMoking  - need to quit smoking. Your husband and you should do this together - participation in rehab will help quit smoking    #Followup  - 8 weeks with Tammy PArrett for med calendar and copd followup - CAT score at followup

## 2011-10-14 ENCOUNTER — Ambulatory Visit (INDEPENDENT_AMBULATORY_CARE_PROVIDER_SITE_OTHER): Payer: Medicare Other | Admitting: Adult Health

## 2011-10-14 ENCOUNTER — Encounter: Payer: Self-pay | Admitting: Adult Health

## 2011-10-14 VITALS — BP 112/74 | HR 74 | Temp 97.4°F | Ht 64.0 in | Wt 149.6 lb

## 2011-10-14 DIAGNOSIS — J449 Chronic obstructive pulmonary disease, unspecified: Secondary | ICD-10-CM

## 2011-10-14 DIAGNOSIS — Z720 Tobacco use: Secondary | ICD-10-CM

## 2011-10-14 DIAGNOSIS — F17200 Nicotine dependence, unspecified, uncomplicated: Secondary | ICD-10-CM

## 2011-10-14 NOTE — Progress Notes (Signed)
Subjective:    Patient ID: Nicole Maxwell, female    DOB: 02/22/1945, 66 y.o.   MRN: [id number]  HPI  #COPD - Oxygen dependent since 2011  - Doubtamine stress echo 05/12/10 - normal, CXR July 2011 - clear  - PFTs 04/13/2010 - shows Gold stage 4 COPD with BD response/asthma component (fev1 0.5L/29%, 10% BD response on fev1 and 38% on FC), DLCO 27%)  - Spirometry Jan 2013 - fev1 0.74L/37%, RAtop 44 - shows gold stage 3 copd  - Class 2-3 exertional dyspnea with precipitants emotion, smoke, lying flat, weather change, pollen, odors  - Chronic cough with white sputjum  - CAT score - 23 in May 2013  #Smoking  #Chronic back pain - s/p lumbar fusion surgery  #Preop pulmonary evaluatio prior to spine surgery - 2013  #There is no history of allergies, aspirin allergies, asthma, bronchiolitis, CAD, DVT, a heart failure, PE, pneumonia  OV 04/09/2011 Still smoking. Nicorette not helping though she is trying with it. HAS questions on e-cig.  Not had back surgery by Dr Danielle Dess yet. She is not sure why but says is because her PMD wanted her to fu with me again. Chest pains still in remission. Dyspnea unchanged. Brought on by emotional upset and by exertion like taking a shower or changing clothes. Associated with leg pains during walks.  Still taking spiriva and qvar.  Advair did not help ($45). 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 Your husband and you should do this together  #preop and followup - I will talk to Dr Danielle Dess to find out status  #Followup  - 8 weeks or sooner if needed   OV 06/11/2011  - no surgery yet; unclear why. I had sent epic note to Dr Danielle Dess but  had not heard back  - c/o cost of medication for copd inhalers - copd stable - cAT score 23 - still smoking and struggling to quit  Past, Family, Social reviewed: no change since last visit  REC #COPD   - this is stable but you have very severe lung damage copd - stop spiriva and  symbicort due to cost  - start albuterol nebulizer and atrovent nebulizer 4 times daily scheduled + albuterol as needed  - take qvar 2 puff twice daily   -nurse will re- refer pulmonary rehab   #SMoking  - need to quit smoking. Your husband and you should do this together  #preop and followup - I would says moderate risk for spine surgery; I will forward note to Dr Danielle Dess  #Followup  - 12 weeks or sooner if needed - CAT score at followup  OV 10/08/2011  - smoking: cut down but social stress and so finding it tough to quit  - neurosurgery: she is reluctant to have it due to moderatre pulm reisk  - copd: stable to improved symptoms. CAT score down from 23 to 18 but finding it tough to afford medications. In addition she is also confused about her MDIs. She is not following instructions from prior OV  CAT COPD Symptom and Quality of Life Score (glax[employer] smith kline trademark)  06/11/11 10/08/2011   Never Cough -> Cough all the time 1 1  No phlegm in chest -> Chest is full of phlegm 2 1  No chest tightness -> Chest feels very tight 1 1  No dyspnea for 1 flight stairs/hill -> Very dyspneic for 1 flight of stairs 5 4  No limitations for ADL at home -> Very limited with ADL at home 4 4  Confident leaving home -> Not at all confident leaving home 5 1  Sleep soundly -> Do not sleep soundly because of lung condition 2 3  Lots of Energy -> No energy at all 3 3  TOTAL Score (max 40)  23 18     10/14/2011 Follow up w/ Med review  Patient returns for a one-week followup for medication review. We reviewed all her medications and organized them into a medication calendar with patient education. Patient is  very confused with her medications. We went over them and reviewed her medication calendar in detail. She never stopped spiriva or started her atrovent .  She is taking qvar and symbicort.  No fever , chest pain or edema.    Review of Systems  Constitutional:   No  weight loss, night sweats,  Fevers, chills,  +fatigue, or  lassitude.  HEENT:   No headaches,  Difficulty swallowing,  Tooth/dental problems, or  Sore throat,                No sneezing, itching, ear ache, nasal congestion, post nasal drip,   CV:  No chest pain,  Orthopnea, PND, swelling in lower extremities, anasarca, dizziness, palpitations, syncope.   GI  No heartburn, indigestion, abdominal pain, nausea, vomiting, diarrhea, change in bowel habits, loss of appetite, bloody stools.   Resp:   No coughing up of blood.  No change in color of mucus.  No wheezing.  No chest wall deformity  Skin: no rash or lesions.  GU: no dysuria, change in color of urine, no urgency or frequency.  No flank pain, no hematuria   MS:  No joint pain or swelling.  No decreased range of motion.  No back pain.  Psych:  No change in mood or affect. No depression or anxiety.  No memory loss.          Objective:   Physical Exam GEN: A/Ox3; pleasant , NAD  HEENT:  Concrete/AT,  EACs-clear, TMs-wnl, NOSE-clear, THROAT-clear, no lesions, no postnasal drip or exudate noted.   NECK:  Supple w/ fair ROM; no JVD; normal carotid impulses w/o bruits; no thyromegaly or nodules palpated; no lymphadenopathy.  RESP  Coarse BS wheezes/ rales/ or rhonchi.no accessory muscle use, no dullness to percussion  CARD:  RRR, no m/r/g  , no peripheral edema, pulses intact, no cyanosis or clubbing.  GI:   Soft & nt; nml bowel sounds; no organomegaly or masses detected.  Musco: Warm bil, no deformities or joint swelling noted.   Neuro: alert, no focal deficits noted.    Skin: Warm, no lesions or rashes           Assessment & Plan:

## 2011-10-14 NOTE — Assessment & Plan Note (Signed)
Advised on importance of smoking cessation  Educated on medications and correct med list Patient's medications were reviewed today and patient education was given. Computerized medication calendar was adjusted/completed

## 2011-10-14 NOTE — Addendum Note (Signed)
Addended by: Boone Master E on: 10/14/2011 05:47 PM   Modules accepted: Orders

## 2011-10-14 NOTE — Patient Instructions (Addendum)
Follow med calendar closely and bring to each visit.  Stop Spiriva and Symbicort  Begin Atrovent and Albuterol neb Four times a day   Very important to quit smoking.  Follow up Dr. Marchelle Gearing 2 months and As needed

## 2011-10-15 ENCOUNTER — Telehealth: Payer: Self-pay | Admitting: Internal Medicine

## 2011-10-15 MED ORDER — IPRATROPIUM BROMIDE 0.02 % IN SOLN: 500.0000 ug | Freq: Four times a day (QID) | RESPIRATORY_TRACT | Status: DC

## 2011-10-15 MED ORDER — QVAR 80 MCG/ACT IN AERS: INHALATION_SPRAY | RESPIRATORY_TRACT | Status: DC

## 2011-10-15 NOTE — Telephone Encounter (Signed)
Called and spoke with pt and she wanted to make sure that the atrovent was sent to [facility].  This was sent to them on 9-20 and pt is aware. Nothing further is needed.

## 2011-10-15 NOTE — Addendum Note (Signed)
Addended by: Boone Master E on: 10/15/2011 10:53 AM   Modules accepted: Orders

## 2011-10-18 NOTE — Addendum Note (Signed)
Addended by: Boone Master E on: 10/18/2011 04:33 PM   Modules accepted: Orders

## 2011-10-19 ENCOUNTER — Telehealth: Payer: Self-pay | Admitting: Internal Medicine

## 2011-10-19 NOTE — Telephone Encounter (Signed)
Nicole Maxwell with [facility] just called and says is refaxing request again to our front office fax. Her # is [phone]. [name]

## 2011-10-19 NOTE — Telephone Encounter (Signed)
ATC x1. Msg stated this person is not receiving calls at this time. WCB later.

## 2011-10-20 ENCOUNTER — Telehealth: Payer: Self-pay | Admitting: Internal Medicine

## 2011-10-20 NOTE — Telephone Encounter (Signed)
Nicole Maxwell, please advise if you have seen this fax? Thanks. If not I can call and have them refax.

## 2011-10-20 NOTE — Telephone Encounter (Signed)
Patient calling back.   °

## 2011-10-21 ENCOUNTER — Telehealth: Payer: Self-pay | Admitting: Internal Medicine

## 2011-10-21 NOTE — Telephone Encounter (Signed)
Form was faxed yesterday to [facility]. Carron Curie, CMA

## 2011-10-21 NOTE — Telephone Encounter (Signed)
Made in error

## 2011-10-22 ENCOUNTER — Telehealth: Payer: Self-pay | Admitting: Internal Medicine

## 2011-10-22 NOTE — Telephone Encounter (Signed)
I spoke with the pt and she states she does not need albuterol, she has a lot of boxes of this, she just needs atrovent. I advised the pt that I will call [facility] and see what is needed since because RX for atrovent was sent on 10-20-11. I spoke with [facility] and they will ship the atrovent now.Carron Curie, CMA

## 2011-10-22 NOTE — Telephone Encounter (Signed)
She should be on albuterol

## 2011-10-22 NOTE — Telephone Encounter (Signed)
Duplicate message. Nicole Maxwell, CMA  

## 2011-10-22 NOTE — Telephone Encounter (Signed)
MR and Victorino Dike, please advise if you still have the Rx for albuterol. Thanks.

## 2011-10-26 ENCOUNTER — Encounter (HOSPITAL_COMMUNITY): Payer: Medicare Other

## 2011-10-26 DIAGNOSIS — Z5189 Encounter for other specified aftercare: Secondary | ICD-10-CM | POA: Insufficient documentation

## 2011-10-26 DIAGNOSIS — Z01811 Encounter for preprocedural respiratory examination: Secondary | ICD-10-CM | POA: Insufficient documentation

## 2011-10-26 DIAGNOSIS — E785 Hyperlipidemia, unspecified: Secondary | ICD-10-CM | POA: Insufficient documentation

## 2011-10-26 DIAGNOSIS — F17200 Nicotine dependence, unspecified, uncomplicated: Secondary | ICD-10-CM | POA: Insufficient documentation

## 2011-10-26 DIAGNOSIS — J449 Chronic obstructive pulmonary disease, unspecified: Secondary | ICD-10-CM | POA: Insufficient documentation

## 2011-10-26 DIAGNOSIS — J4489 Other specified chronic obstructive pulmonary disease: Secondary | ICD-10-CM | POA: Insufficient documentation

## 2011-10-26 DIAGNOSIS — R079 Chest pain, unspecified: Secondary | ICD-10-CM | POA: Insufficient documentation

## 2011-10-26 DIAGNOSIS — I10 Essential (primary) hypertension: Secondary | ICD-10-CM | POA: Insufficient documentation

## 2011-10-28 ENCOUNTER — Encounter (HOSPITAL_COMMUNITY): Payer: Medicare Other

## 2011-10-29 ENCOUNTER — Telehealth: Payer: Self-pay | Admitting: Internal Medicine

## 2011-10-29 NOTE — Telephone Encounter (Signed)
Ok to go back to taking the meds the way she was until can come back and regroup with Dr Marchelle Gearing or Babette Relic NP

## 2011-10-29 NOTE — Telephone Encounter (Signed)
Pt states that neb medication was changed recently to Atrovent and started QVAR in place of Symbicort; she has been having some side effects. Patient states that since beginning this medication she has been having increased SOB. Pt states that she is very short winded and feels the Atrovent is not working for her.  Last OV patient was taken Off symbicort and spiriva and told to Start Qvar in place of symbicort. Patient aware that MR not in office today but that another doctor will review this message. Dr Sherene Sires Please Advise, Thanks.

## 2011-11-01 NOTE — Telephone Encounter (Signed)
Pt called back again.  Please call her @ [phone] Nicole Maxwell

## 2011-11-01 NOTE — Telephone Encounter (Signed)
I spoke with pt and she is aware of MW recs. She stated she restarted the spiriva 2 days ago. She feels better. She is still taking her QVAR daily. Her breathing is doing breathing. Will forward to MR as an [place]

## 2011-11-02 ENCOUNTER — Encounter (HOSPITAL_COMMUNITY): Payer: Medicare Other

## 2011-11-02 ENCOUNTER — Encounter (HOSPITAL_COMMUNITY): Payer: Self-pay

## 2011-11-02 ENCOUNTER — Encounter (HOSPITAL_COMMUNITY)
Admission: RE | Admit: 2011-11-02 | Discharge: 2011-11-02 | Disposition: A | Discharge status: Home / Self Care | Payer: Medicare Other | Source: Ambulatory Visit | Attending: Internal Medicine | Admitting: Internal Medicine

## 2011-11-02 NOTE — Telephone Encounter (Signed)
Thanks & noted

## 2011-11-02 NOTE — Progress Notes (Signed)
Nicole Maxwell came today for orientation to Pulmonary Rehab.  Health history and medications were reviewed with patient.  She is very concerned about intolerance of budesonide.  She has omitted this from her medication list, using albuterol neb instead.  States she has restarted Spiriva and it is helping her. She had reported this to Pulmonary office and was told to use the medication that was helping her.  Today she was counseled, given support on smoking cessation.  Goals were set for Pulmonary Rehab.  Walk test delayed today and will be done on her first day of exercise.  We look forward to helping her achieve her goals, achieve smoking cessation and improve her quality of life.

## 2011-11-04 ENCOUNTER — Encounter (HOSPITAL_COMMUNITY): Payer: Medicare Other

## 2011-11-04 ENCOUNTER — Ambulatory Visit (HOSPITAL_COMMUNITY): Payer: Medicare Other

## 2011-11-08 ENCOUNTER — Telehealth: Payer: Self-pay | Admitting: Internal Medicine

## 2011-11-08 NOTE — Telephone Encounter (Signed)
ATC pt line rang several times then received a busy signal. I tried calling 3 times and it did the same thing. WCB later

## 2011-11-09 ENCOUNTER — Encounter (HOSPITAL_COMMUNITY)
Admission: RE | Admit: 2011-11-09 | Discharge: 2011-11-09 | Disposition: A | Discharge status: Home / Self Care | Payer: Medicare Other | Source: Ambulatory Visit | Attending: Internal Medicine | Admitting: Internal Medicine

## 2011-11-09 MED ORDER — DOXYCYCLINE HYCLATE 100 MG PO TABS: 100.0000 mg | ORAL_TABLET | Freq: Two times a day (BID) | ORAL | Status: DC

## 2011-11-09 MED ORDER — PREDNISONE 10 MG PO TABS: ORAL_TABLET | ORAL | Status: DC

## 2011-11-09 NOTE — Telephone Encounter (Signed)
Spoke with pt's spouse [name] and notified that rxs were sent to pharm. He verbalized understanding and states that he will inform the pt.

## 2011-11-09 NOTE — Telephone Encounter (Signed)
I spoke with pt and she c/o cough w/ off white color phlem, chest congestion, slight increase SOB due to the cough, wheezing c 3 days. She is not able to come in for OV today bc she is going to pulm rehab at 10:00 and only had openings this AM. Pt would like something called in for this. Please advise MR thanks  Allergies  Allergen Reactions  . Brovana (Arformoterol Tartrate) Shortness Of Breath and Cough    General intolerance  . Budesonide Shortness Of Breath and Cough    General intolerance

## 2011-11-09 NOTE — Telephone Encounter (Signed)
ATC pt NA unable to leave VM. WCB. RX's have been sent to [facility] [address].

## 2011-11-09 NOTE — Addendum Note (Signed)
Addended by: Tommie Sams on: 11/09/2011 11:32 AM   Modules accepted: Orders

## 2011-11-09 NOTE — Progress Notes (Signed)
First day of exercise.  Walk test done prior to exercise.  She was able to maintain oxygen saturations above 90% on room air for 6 min without rest break.  Oriented to use of equipment and safety.  Demonstration and practice of PLB on all equipment.  Tolerated well.

## 2011-11-09 NOTE — Telephone Encounter (Signed)
Probably early aecopd Take doxycycline 100mg  po twice daily x 5 days; take after meals and avoid sunlight  Take prednisone 40 mg daily x 2 days, then 20mg  daily x 2 days, then 10mg  daily x 2 days, then 5mg  daily x 2 days and stop or go to baseline prednisone dose if she is on chronic prednisone

## 2011-11-11 ENCOUNTER — Encounter (HOSPITAL_COMMUNITY)
Admission: RE | Admit: 2011-11-11 | Discharge: 2011-11-11 | Disposition: A | Discharge status: Home / Self Care | Payer: Medicare Other | Source: Ambulatory Visit | Attending: Internal Medicine | Admitting: Internal Medicine

## 2011-11-16 ENCOUNTER — Encounter (HOSPITAL_COMMUNITY)
Admission: RE | Admit: 2011-11-16 | Discharge: 2011-11-16 | Disposition: A | Discharge status: Home / Self Care | Payer: Medicare Other | Source: Ambulatory Visit | Attending: Internal Medicine | Admitting: Internal Medicine

## 2011-11-16 LAB — GLUCOSE, CAPILLARY: Glucose-Capillary: 99 mg/dL (ref 70–99)

## 2011-11-17 ENCOUNTER — Telehealth: Payer: Self-pay | Admitting: Internal Medicine

## 2011-11-17 NOTE — Telephone Encounter (Signed)
Needs both but use them separately

## 2011-11-17 NOTE — Telephone Encounter (Signed)
I spoke with pt and she stated when she mixes the albuterol and the atrovent together it takes her breathe and feels like she can't breathe. When she does plain albuterol by itself then she is fine. Pt is wanting to know if she can stop the atrovent or try the atrovent by itself w/o the albuterol. Please advise MR thanks

## 2011-11-17 NOTE — Telephone Encounter (Signed)
I spoke with pt and is aware of MR recs. She voiced her understanding and needed nothing further 

## 2011-11-18 ENCOUNTER — Encounter (HOSPITAL_COMMUNITY)
Admission: RE | Admit: 2011-11-18 | Discharge: 2011-11-18 | Disposition: A | Discharge status: Home / Self Care | Payer: Medicare Other | Source: Ambulatory Visit | Attending: Internal Medicine | Admitting: Internal Medicine

## 2011-11-18 LAB — GLUCOSE, CAPILLARY: Glucose-Capillary: 96 mg/dL (ref 70–99)

## 2011-11-18 NOTE — Progress Notes (Addendum)
Nicole Maxwell 66 y.o. female Nutrition Note Spoke with pt. Pt is overweight. Pt states she weighed 200# "1 to 1.5 years ago." Pt reports she lost wt by exercising more and cutting back on portion sizes. Pt wants to lose 15# more. There are many ways the pt can make her eating habits healthier.  Pt's Rate Your Plate results reviewed with pt. Pt knows she could eat healthier and is not sure if she wants to change her dietary habits at this time.  Pt does not avoid salty food; uses canned/ convenience food. Pt cans vegetables herself and uses 1/2 tsp of salt for "6 quarts of beans." Pt adds salt to food. Pt has tried No Salt per discussion. Pt discouraged from using No Salt and encouraged to discuss No Salt use with her MD.  The role of sodium in lung disease reviewed with pt.  Pt is on Metformin. Per pt, "they say I'm diabetic, but I don't believe it." No recent A1c noted. Pt denies ever receiving DM education. Pt encouraged to come to DM education classes offered through rehab.  Pt checks fasting CBG's in the morning. Pt reports CBG's 84-119 mg/dL. Pt unaware of recommended fasting CBG range. Recommended fasting CBG range reviewed with pt. Pt expressed understanding. Pt plans on attending the [facility] class.   Nutrition Diagnosis   Excessive sodium intake related to over consumption of processed food as evidenced by frequent consumption of convenience food/ canned vegetables.   Food-and nutrition-related knowledge deficit related to lack of exposure to information as related to diagnosis of pulmonary disease and Diabetes   Overweight related to excessive energy intake as evidenced by a BMI of 26.4   Limited adherence to nutrition-related recommendations related to poor understanding or disinterest as evidenced by food history. Nutrition Rx/Est. Daily Nutrition Needs for: ? wt loss 1300 Kcal  80-100 gm protein   1500 mg or less sodium     150 gm CHO Nutrition Intervention   Pt's individual nutrition  plan and goals reviewed with pt.   Benefits of adopting healthy eating habits discussed when pt's Rate Your Plate reviewed.   Pt to consider adding Calcium supplement due to prednisone use.   Pt to consider adding a MVI and vitamin C due to poor quality diet and continued tobacco use.   Pt to attend the Nutrition and Lung Disease class   Continual client-centered nutrition education by RD, as part of interdisciplinary care. Goal(s) 1. Pt to identify and limit food sources of sodium. 2. Identify food quantities necessary to achieve wt loss of  -2# per week to a goal wt of 56.2-61.6 kg (124-136 lb) at graduation from pulmonary rehab. 3. Describe the benefit of including fruits, vegetables, whole grains, and low-fat dairy products in a healthy meal plan. 4. CBG's in the normal range or as close to normal as is safely possible. Monitor and Evaluate progress toward nutrition goal with team.   Nutrition Risk: High

## 2011-11-19 ENCOUNTER — Telehealth: Payer: Self-pay | Admitting: Internal Medicine

## 2011-11-19 NOTE — Telephone Encounter (Signed)
She could not afford spiriva and symbicort.   She is welcome to stop nebs and qvar and instead try samples of tudorza and dulera (low dose) both bid for 2 months with samples and see if this works but we cannot give her samples indefinitely. At the most will test if inhalers are beter than nebulizers for her  However, any sudden worsening over hours or days like she had, go to ER or call our answering service  MR

## 2011-11-19 NOTE — Telephone Encounter (Signed)
Spoke with pt and notified of recs per MR She states willing to try the dulera and tudorza Samples up front for pick up

## 2011-11-19 NOTE — Telephone Encounter (Signed)
Spoke with patient, she is stating that when shes SOB and while on the Qvar BID and atrovent she still gets no relief from and as a matter of fact makes her feel worse.  Patient states last night she got absolutely no rest and felt so bad she almost went to the ED until she took a nerve pill and this helped.  Patient would like recs from Dr. Marchelle Gearing on how to proceed.  Pt is requesting that if MR wants her to try a medication and we have samples she wants the samples first since [facility], but if we have to call in something it is ok to use [facility] on [address]

## 2011-11-23 ENCOUNTER — Encounter (HOSPITAL_COMMUNITY)
Admission: RE | Admit: 2011-11-23 | Discharge: 2011-11-23 | Disposition: A | Discharge status: Home / Self Care | Payer: Medicare Other | Source: Ambulatory Visit | Attending: Internal Medicine | Admitting: Internal Medicine

## 2011-11-25 ENCOUNTER — Encounter (HOSPITAL_COMMUNITY)
Admission: RE | Admit: 2011-11-25 | Discharge: 2011-11-25 | Disposition: A | Discharge status: Home / Self Care | Payer: Medicare Other | Source: Ambulatory Visit | Attending: Internal Medicine | Admitting: Internal Medicine

## 2011-11-30 ENCOUNTER — Encounter (HOSPITAL_COMMUNITY): Payer: Medicare Other

## 2011-12-02 ENCOUNTER — Encounter (HOSPITAL_COMMUNITY)
Admission: RE | Admit: 2011-12-02 | Discharge: 2011-12-02 | Disposition: A | Discharge status: Home / Self Care | Payer: Medicare Other | Source: Ambulatory Visit | Attending: Internal Medicine | Admitting: Internal Medicine

## 2011-12-02 DIAGNOSIS — E785 Hyperlipidemia, unspecified: Secondary | ICD-10-CM | POA: Insufficient documentation

## 2011-12-02 DIAGNOSIS — I10 Essential (primary) hypertension: Secondary | ICD-10-CM | POA: Insufficient documentation

## 2011-12-02 DIAGNOSIS — Z01811 Encounter for preprocedural respiratory examination: Secondary | ICD-10-CM | POA: Insufficient documentation

## 2011-12-02 DIAGNOSIS — F17200 Nicotine dependence, unspecified, uncomplicated: Secondary | ICD-10-CM | POA: Insufficient documentation

## 2011-12-02 DIAGNOSIS — R079 Chest pain, unspecified: Secondary | ICD-10-CM | POA: Insufficient documentation

## 2011-12-02 DIAGNOSIS — J4489 Other specified chronic obstructive pulmonary disease: Secondary | ICD-10-CM | POA: Insufficient documentation

## 2011-12-02 DIAGNOSIS — J449 Chronic obstructive pulmonary disease, unspecified: Secondary | ICD-10-CM | POA: Insufficient documentation

## 2011-12-02 DIAGNOSIS — Z5189 Encounter for other specified aftercare: Secondary | ICD-10-CM | POA: Insufficient documentation

## 2011-12-07 ENCOUNTER — Encounter (HOSPITAL_COMMUNITY)
Admission: RE | Admit: 2011-12-07 | Discharge: 2011-12-07 | Disposition: A | Discharge status: Home / Self Care | Payer: Medicare Other | Source: Ambulatory Visit | Attending: Internal Medicine | Admitting: Internal Medicine

## 2011-12-08 ENCOUNTER — Ambulatory Visit (INDEPENDENT_AMBULATORY_CARE_PROVIDER_SITE_OTHER): Payer: Medicare Other | Admitting: Internal Medicine

## 2011-12-08 ENCOUNTER — Encounter: Payer: Self-pay | Admitting: Internal Medicine

## 2011-12-08 VITALS — BP 128/70 | HR 93 | Temp 97.7°F | Ht 64.0 in | Wt 150.6 lb

## 2011-12-08 DIAGNOSIS — J449 Chronic obstructive pulmonary disease, unspecified: Secondary | ICD-10-CM

## 2011-12-08 DIAGNOSIS — J441 Chronic obstructive pulmonary disease with (acute) exacerbation: Secondary | ICD-10-CM

## 2011-12-08 DIAGNOSIS — I10 Essential (primary) hypertension: Secondary | ICD-10-CM

## 2011-12-08 MED ORDER — PREDNISONE 10 MG PO TABS: ORAL_TABLET | ORAL | Status: DC

## 2011-12-08 MED ORDER — DOXYCYCLINE HYCLATE 100 MG PO TABS: 100.0000 mg | ORAL_TABLET | Freq: Two times a day (BID) | ORAL | Status: DC

## 2011-12-08 NOTE — Progress Notes (Signed)
Subjective:    Patient ID: Nicole Maxwell, female    DOB: [birth year], 66 y.o.   MRN: [id number]  HPI #COPD - Oxygen dependent since 2011  - Doubtamine stress echo 05/12/10 - normal, CXR July 2011 - clear  - PFTs 04/13/2010 - shows Gold stage 4 COPD with BD response/asthma component (fev1 0.5L/29%, 10% BD response on fev1 and 38% on FC), DLCO 27%)  - Spirometry Jan 2013 - fev1 0.74L/37%, RAtop 44 - shows gold stage 3 copd  - Class 2-3 exertional dyspnea with precipitants emotion, smoke, lying flat, weather change, pollen, odors  - Chronic cough with white sputjum  - CAT score - 23 in May 2013 - CAT score 19 in NOv 2013  #Smoking  #Chronic back pain - s/p lumbar fusion surgery  #Preop pulmonary evaluatio prior to spine surgery - 2013  #There is no history of allergies, aspirin allergies, asthma, bronchiolitis, CAD, DVT, a heart failure, PE, pneumonia  OV 04/09/2011 Still smoking. Nicorette not helping though she is trying with it. HAS questions on e-cig.  Not had back surgery by Dr Danielle Dess yet. She is not sure why but says is because her PMD wanted her to fu with me again. Chest pains still in remission. Dyspnea unchanged. Brought on by emotional upset and by exertion like taking a shower or changing clothes. Associated with leg pains during walks.  Still taking spiriva and qvar.  Advair did not help ($45). 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 Your husband and you should do this together  #preop and followup - I will talk to Dr Danielle Dess to find out status  #Followup  - 8 weeks or sooner if needed   OV 06/11/2011  - no surgery yet; unclear why. I had sent  epic note to Dr Danielle Dess but had not heard back  - c/o cost of medication for copd inhalers - copd stable - cAT score 23 - still smoking and struggling to quit  Past, Family, Social reviewed: no change since last visit  REC #COPD   - this is stable but you have very severe lung damage copd - stop spiriva and  symbicort due to cost  - start albuterol nebulizer and atrovent nebulizer 4 times daily scheduled + albuterol as needed  - take qvar 2 puff twice daily   -nurse will re- refer pulmonary rehab   #SMoking  - need to quit smoking. Your husband and you should do this together  #preop and followup - I would says moderate risk for spine surgery; I will forward note to Dr Danielle Dess  #Followup  - 12 weeks or sooner if needed - CAT score at followup  OV 10/08/2011  - smoking: cut down but social stress and so finding it tough to quit  - neurosurgery: she is reluctant to have it due to moderatre pulm reisk  - copd: stable to improved symptoms. CAT score down from 23 to 18 but finding it tough to afford medications. In addition she is also confused about her MDIs. She is not following  instructions from prior OV   10/14/2011 Follow up w/ Med review  Patient returns for a one-week followup for medication review. We reviewed all her medications and organized them into a medication calendar with patient education. Patient is very confused with her medications. We went over them and reviewed her medication calendar in detail. She never stopped spiriva or started her atrovent .  She is taking qvar and symbicort.  No fever , chest pain or edema.   #COPD  - this is stable but you have very severe lung damage copd  - IN addition you are very confused about your medications. You need a medicine calendar  - continue albuterol nebulizer and atrovent nebulizer 4 times daily scheduled + albuterol as needed  - continue qvar 2 puff twice daily (take some samples to help offset cost)    -make sure you call [facility] and attend rehab  - also I will have you see NP Tammy ASAP for med calendar and folowup  #SMoking  - need to quit smoking. Your husband and you should do this together  - participation in rehab will help quit smoking  #Followup  - 8 weeks with Tammy PArrett for med calendar and copd followup  - CAT score at followup    OV 12/08/2011 FU for above  COPD: I cannot figure out her issues today. Best I can figure out (took long time): she is totally confused about meds. SHe is upset about her med regimen. She really liked spiriva but has $ issues. So we had her on nebs with a med calendar made out for her: It appears she did not like atrovent nebs but then does not want to pay up for spiriva which worked real well fo her. She is thankful for tudorza and dulera samples but does not want to be seen as sample dependent.  She feels health care provieders are judging her to be sample dependent. She wants to change PMD to [place] so all can be under one roof. Current CAT score is 19 and cough some worse with sputum off white. Exam has wheeze which is a change  SMoking: still smoking. Says she is trying to quit    CAT COPD Symptom and Quality of Life Score ([employer])  06/11/11 10/08/2011  12/08/2011   Never Cough -> Cough all the time 1 1 3   No phlegm in chest -> Chest is full of phlegm 2 1 2   No chest tightness -> Chest feels very tight 1 1 1   No dyspnea for 1 flight stairs/hill -> Very dyspneic for 1 flight of stairs 5 4 4   No limitations for ADL at home -> Very limited with ADL at home 4 4 3   Confident leaving home -> Not at all confident leaving home 5 1 0  Sleep soundly -> Do not sleep soundly because of lung condition 2 3 3   Lots of Energy -> No energy at all 3 3 3   TOTAL Score (max 40)  23 18 19       Current outpatient prescriptions:Aclidinium Bromide (TUDORZA PRESSAIR) 400 MCG/ACT AEPB, Inhale 1 puff into the lungs 2 (two) times daily.,  Disp: , Rfl: ;  albuterol (PROVENTIL HFA;VENTOLIN HFA) 108 (90 BASE) MCG/ACT inhaler, Inhale 2 puffs into the lungs every 4 (four) hours as needed. , Disp: , Rfl:  albuterol (PROVENTIL) (2.5 MG/3ML) 0.083% nebulizer solution, Take 3 mLs (2.5 mg total) by nebulization 4 (four) times daily. And as needed., Disp: 360 mL, Rfl:  12;  alprazolam (XANAX) 2 MG tablet, Take 1 mg by mouth every 8 (eight) hours as needed. , Disp: , Rfl: ;  aspirin 325 MG tablet, Take 325 mg by mouth daily.  , Disp: , Rfl:  HYDROcodone-acetaminophen (NORCO/VICODIN) 5-325 MG per tablet, Take 1 tablet by mouth every 6 (six) hours as needed., Disp: , Rfl: ;  losartan-hydrochlorothiazide (HYZAAR) 100-25 MG per tablet, Take 1 tablet by mouth daily.  , Disp: , Rfl: ;  metFORMIN (GLUCOPHAGE) 500 MG tablet, Take 500 mg by mouth 2 (two) times daily with a meal.  , Disp: , Rfl:  mometasone-formoterol (DULERA) 100-5 MCG/ACT AERO, Inhale 2 puffs into the lungs 2 (two) times daily., Disp: , Rfl: ;  QVAR 80 MCG/ACT inhaler, 2 puff twice daily. Rinse mouth after use, Disp: 3 Inhaler, Rfl: 3;  rosuvastatin (CRESTOR) 5 MG tablet, Take 5 mg by mouth daily.  , Disp: , Rfl: ;  [DISCONTINUED] albuterol (PROAIR HFA) 108 (90 BASE) MCG/ACT inhaler, Inhale 2 puffs into the lungs every 4 (four) hours as needed., Disp: , Rfl:    Review of Systems  Constitutional: Negative for fever and unexpected weight change.  HENT: Negative for ear pain, nosebleeds, congestion, sore throat, rhinorrhea, sneezing, trouble swallowing, dental problem, postnasal drip and sinus pressure.   Eyes: Negative for redness and itching.  Respiratory: Negative for cough, chest tightness, shortness of breath and wheezing.   Cardiovascular: Negative for palpitations and leg swelling.  Gastrointestinal: Negative for nausea and vomiting.  Genitourinary: Negative for dysuria.  Musculoskeletal: Negative for joint swelling.  Skin: Negative for rash.  Neurological: Negative for headaches.    Hematological: Does not bruise/bleed easily.  Psychiatric/Behavioral: Negative for dysphoric mood. The patient is not nervous/anxious.        Objective:   Physical Exam GEN: A/Ox3; pleasant , NAD  HEENT:  [place]/AT,  EACs-clear, TMs-wnl, NOSE-clear, THROAT-clear, no lesions, no postnasal drip or exudate noted.   NECK:  Supple w/ fair ROM; no JVD; normal carotid impulses w/o bruits; no thyromegaly or nodules palpated; no lymphadenopathy.  RESP  Coarse BS  WITH BILTAERAL WHEEZE + No rales/ or rhonchi.no accessory muscle use, no dullness to percussion  CARD:  RRR, no m/r/g  , no peripheral edema, pulses intact, no cyanosis or clubbing.  GI:   Soft & nt; nml bowel sounds; no organomegaly or masses detected.  Musco: Warm bil, no deformities or joint swelling noted.   Neuro: alert, no focal deficits noted.    Skin: Warm, no lesions or rashes             Assessment & Plan:

## 2011-12-08 NOTE — Patient Instructions (Addendum)
#  AECOPD -  You are having mild copd flare - Take doxycycline 100mg  po twice daily x 5 days; take after meals and avoid sunlight - Please take Take prednisone 40mg  once daily x 3 days, then 30mg  once daily x 3 days, then 20mg  once daily x 3 days, then prednisone 10mg  once daily  x 3 days and stop   #COPD  - stop spiriva due to cost - do not take tudorza or dulera samples  - use albuterol and atrovent nebulizer  4times daily; do not mix them up - use QVAR 2 puff twice daily  #SMoking  - please quit smoking  #Followup 1 month or sooner for COPD followup to see how medications are working CAT score at followup

## 2011-12-09 ENCOUNTER — Encounter (HOSPITAL_COMMUNITY)
Admission: RE | Admit: 2011-12-09 | Discharge: 2011-12-09 | Disposition: A | Discharge status: Home / Self Care | Payer: Medicare Other | Source: Ambulatory Visit | Attending: Internal Medicine | Admitting: Internal Medicine

## 2011-12-09 DIAGNOSIS — J441 Chronic obstructive pulmonary disease with (acute) exacerbation: Secondary | ICD-10-CM | POA: Insufficient documentation

## 2011-12-09 NOTE — Assessment & Plan Note (Signed)
AECOPD -  You are having mild copd flare - Take doxycycline 100mg  po twice daily x 5 days; take after meals and avoid sunlight - Please take Take prednisone 40mg  once daily x 3 days, then 30mg  once daily x 3 days, then 20mg  once daily x 3 days, then prednisone 10mg  once daily  x 3 days and stop

## 2011-12-09 NOTE — Assessment & Plan Note (Signed)
Issue of med benefit v cost. Found out that intolerance to atrovent was she was mixing alb and atrovent together. Explained that though spiriva gave her better benefit than atrovent nebs subjectively and though objective evidence is spiriva is possibly superior in outcomes, the gain in benefit over cost of bankruptcy is minimal. So, better of doing alb and atroven nebs with qvar. She agreed to this plan  #COPD  - stop spiriva due to cost - do not take tudorza or dulera samples  - use albuterol and atrovent nebulizer  4times daily; do not mix them up - use QVAR 2 puff twice daily  > 50% of this > 25 min visit spent in face to face counseling (15 min visit converted to 25 min)   #SMoking  - please quit smoking  #Followup 1 month or sooner for COPD followup to see how medications are working CAT score at followup

## 2011-12-14 ENCOUNTER — Encounter (HOSPITAL_COMMUNITY): Payer: Medicare Other

## 2011-12-16 ENCOUNTER — Encounter (HOSPITAL_COMMUNITY)
Admission: RE | Admit: 2011-12-16 | Discharge: 2011-12-16 | Disposition: A | Discharge status: Home / Self Care | Payer: Medicare Other | Source: Ambulatory Visit | Attending: Internal Medicine | Admitting: Internal Medicine

## 2011-12-21 ENCOUNTER — Encounter (HOSPITAL_COMMUNITY)
Admission: RE | Admit: 2011-12-21 | Discharge: 2011-12-21 | Disposition: A | Discharge status: Home / Self Care | Payer: Medicare Other | Source: Ambulatory Visit | Attending: Internal Medicine | Admitting: Internal Medicine

## 2011-12-23 ENCOUNTER — Encounter (HOSPITAL_COMMUNITY): Payer: Medicare Other

## 2011-12-28 ENCOUNTER — Encounter (HOSPITAL_COMMUNITY)
Admission: RE | Admit: 2011-12-28 | Discharge: 2011-12-28 | Disposition: A | Discharge status: Home / Self Care | Payer: Medicare Other | Source: Ambulatory Visit | Attending: Internal Medicine | Admitting: Internal Medicine

## 2011-12-28 DIAGNOSIS — Z5189 Encounter for other specified aftercare: Secondary | ICD-10-CM | POA: Insufficient documentation

## 2011-12-28 DIAGNOSIS — Z01811 Encounter for preprocedural respiratory examination: Secondary | ICD-10-CM | POA: Insufficient documentation

## 2011-12-28 DIAGNOSIS — R079 Chest pain, unspecified: Secondary | ICD-10-CM | POA: Insufficient documentation

## 2011-12-28 DIAGNOSIS — F17200 Nicotine dependence, unspecified, uncomplicated: Secondary | ICD-10-CM | POA: Insufficient documentation

## 2011-12-28 DIAGNOSIS — J449 Chronic obstructive pulmonary disease, unspecified: Secondary | ICD-10-CM | POA: Insufficient documentation

## 2011-12-28 DIAGNOSIS — J4489 Other specified chronic obstructive pulmonary disease: Secondary | ICD-10-CM | POA: Insufficient documentation

## 2011-12-28 DIAGNOSIS — I10 Essential (primary) hypertension: Secondary | ICD-10-CM | POA: Insufficient documentation

## 2011-12-28 DIAGNOSIS — E785 Hyperlipidemia, unspecified: Secondary | ICD-10-CM | POA: Insufficient documentation

## 2011-12-30 ENCOUNTER — Encounter (HOSPITAL_COMMUNITY)
Admission: RE | Admit: 2011-12-30 | Discharge: 2011-12-30 | Disposition: A | Discharge status: Home / Self Care | Payer: Medicare Other | Source: Ambulatory Visit | Attending: Internal Medicine | Admitting: Internal Medicine

## 2012-01-04 ENCOUNTER — Encounter (HOSPITAL_COMMUNITY)
Admission: RE | Admit: 2012-01-04 | Discharge: 2012-01-04 | Disposition: A | Discharge status: Home / Self Care | Payer: Medicare Other | Source: Ambulatory Visit | Attending: Internal Medicine | Admitting: Internal Medicine

## 2012-01-06 ENCOUNTER — Other Ambulatory Visit: Payer: Self-pay | Admitting: Internal Medicine

## 2012-01-06 ENCOUNTER — Encounter (HOSPITAL_COMMUNITY)
Admission: RE | Admit: 2012-01-06 | Discharge: 2012-01-06 | Disposition: A | Discharge status: Home / Self Care | Payer: Medicare Other | Source: Ambulatory Visit | Attending: Internal Medicine | Admitting: Internal Medicine

## 2012-01-06 NOTE — Telephone Encounter (Signed)
Called and spoke with pt She is requesting refill on pred taper and doxy She feels slight increase DOE for 2 days Cough is slightly worse- prod with clear to white sputum No f/c/s, wheezing, CP or chest tightness Offered ov with MR for tomorrow, but she refused, and states will wait to see him on 12/17 as planned Offered to send msg to doc of day and she still only prefers MR's recs Pt notified to report to call doc on call here, or go to UC or ED should her symptoms persist or worsen Please advise, thanks! Allergies  Allergen Reactions  . Brovana (Arformoterol Tartrate) Shortness Of Breath and Cough    General intolerance  . Budesonide Shortness Of Breath and Cough    General intolerance

## 2012-01-07 MED ORDER — DOXYCYCLINE HYCLATE 100 MG PO TABS: 100.0000 mg | ORAL_TABLET | Freq: Two times a day (BID) | ORAL | Status: DC

## 2012-01-07 MED ORDER — PREDNISONE 10 MG PO TABS: ORAL_TABLET | ORAL | Status: DC

## 2012-01-07 NOTE — Telephone Encounter (Signed)
Called spoke with patient, advised of MR's recs as stated by MR below.  Pt was audibly SOB and wheezing on the phone but stated that she was "running around trying to get the "younguns" ready for school."  Pt again declined ov, stating that she is scheduled for appt on 12.17.13 w/ MR (verified).  Rx's for prednisone and doxy sent to verified pharmacy.

## 2012-01-07 NOTE — Telephone Encounter (Signed)
Please take Take prednisone 40mg  once daily x 3 days, then 30mg  once daily x 3 days, then 20mg  once daily x 3 days, then prednisone 10mg  once daily  x 3 days and stop  Take doxycycline 100mg  po twice daily x 5 days; take after meals and avoid sunlight   For aecopd

## 2012-01-11 ENCOUNTER — Encounter (HOSPITAL_COMMUNITY): Payer: Medicare Other

## 2012-01-11 ENCOUNTER — Ambulatory Visit (INDEPENDENT_AMBULATORY_CARE_PROVIDER_SITE_OTHER): Payer: Medicare Other | Admitting: Internal Medicine

## 2012-01-11 ENCOUNTER — Encounter: Payer: Self-pay | Admitting: Internal Medicine

## 2012-01-11 ENCOUNTER — Encounter (HOSPITAL_COMMUNITY)
Admission: RE | Admit: 2012-01-11 | Discharge: 2012-01-11 | Disposition: A | Discharge status: Home / Self Care | Payer: Medicare Other | Source: Ambulatory Visit | Attending: Internal Medicine | Admitting: Internal Medicine

## 2012-01-11 VITALS — BP 130/70 | HR 105 | Temp 97.7°F | Ht 64.0 in | Wt 149.4 lb

## 2012-01-11 DIAGNOSIS — J449 Chronic obstructive pulmonary disease, unspecified: Secondary | ICD-10-CM

## 2012-01-11 DIAGNOSIS — Z720 Tobacco use: Secondary | ICD-10-CM

## 2012-01-11 DIAGNOSIS — F17200 Nicotine dependence, unspecified, uncomplicated: Secondary | ICD-10-CM

## 2012-01-11 MED ORDER — ALBUTEROL SULFATE HFA 108 (90 BASE) MCG/ACT IN AERS: 2.0000 | INHALATION_SPRAY | Freq: Four times a day (QID) | RESPIRATORY_TRACT | Status: DC | PRN

## 2012-01-11 NOTE — Assessment & Plan Note (Signed)
Unable to quit 

## 2012-01-11 NOTE — Progress Notes (Signed)
Subjective:    Patient ID: Nicole Maxwell, female    DOB: [birth year], 66 y.o.   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 Date  - Preop pulmonary evaluatio prior to spine surgery - 2013, surgery by Dr Danielle Dess never happend  #There is no history of allergies, aspirin allergies, asthma, bronchiolitis, CAD, DVT, a heart failure, PE, pneumonia  # Poor ability to understand questions and answer them. Difficulty following med advice  - noticed since march 2013  OV 01/11/2012    Presents with husband. Totally unclear again what her main issue is. ON CAT score is seems ithe baseline range of 18-23 with score today being 22. She admits to no changein baseline symptoms . She showed her med calendraw and swore she is on albuterol + atrovent scheduled + qvar mdi 2 puff bid. But she again points to [place] and spiriva as having worke well for her. But when I asked her if she wants to switch to them she has no answer. She says "well it is too costly". I asked her if nebss not working well for her and she could not answer the question one way or another.  Of note, she continues to attend rehab  Repeat pattern of not answering questions directly nor asking questions. Her husband too chided her for this. No one in office can understand what her concerns are. I have asked her to write questions down in future before doc visit   Still smoking: Unable to  quit   CAT COPD Symptom and Quality of Life Score (glaxo smith kline trademark)  06/11/11 10/08/2011  12/08/2011  01/11/2012   Never Cough -> Cough all the time 1 1 3 2   No phlegm in chest -> Chest is full of phlegm 2 1 2 2   No chest tightness -> Chest feels very tight 1 1 1 2   No dyspnea for 1 flight stairs/hill -> Very dyspneic for 1 flight of stairs 5 4 4 4   No limitations for ADL at home -> Very limited with ADL at home 4 4 3 3   Confident leaving home -> Not at all confident leaving home 5 1 0 2  Sleep soundly -> Do not sleep soundly because of lung condition 2 3 3 4   Lots of Energy -> No energy at all 3 3 3 3   TOTAL Score (max 40)  23 18 19 22        Past, Family, Social reviewed: no change since last visit   Review of Systems  Constitutional: Negative for fever and unexpected weight change.  HENT: Negative for ear pain, nosebleeds, congestion, sore throat, rhinorrhea, sneezing, trouble swallowing, dental problem, postnasal drip and sinus pressure.   Eyes: Negative for redness and itching.  Respiratory: Positive for cough and shortness of breath. Negative for chest tightness and wheezing.   Cardiovascular: Negative for palpitations and  leg swelling.  Gastrointestinal: Negative for nausea and vomiting.  Genitourinary: Negative for dysuria.  Musculoskeletal: Negative for joint swelling.  Skin: Negative for rash.  Neurological: Negative for headaches.  Hematological: Does not bruise/bleed easily.  Psychiatric/Behavioral: Negative for dysphoric mood. The patient is not nervous/anxious.        Objective:   Physical Exam GEN: A/Ox3; pleasant , NAD  HEENT:  [place]/AT,  EACs-clear, TMs-wnl, NOSE-clear, THROAT-clear, no lesions, no postnasal drip or exudate noted.   NECK:  Supple w/ fair ROM; no JVD; normal carotid impulses w/o bruits; no thyromegaly or nodules palpated; no lymphadenopathy.  RESP  Coarse BS  NO WHEEZE No rales/ or rhonchi.no accessory muscle use, no dullness to  percussion  CARD:  RRR, no m/r/g  , no peripheral edema, pulses intact, no cyanosis or clubbing.  GI:   Soft & nt; nml bowel sounds; no organomegaly or masses detected.  Musco: Warm bil, no deformities or joint swelling noted.   Neuro: alert, no focal deficits noted.    Skin: Warm, no lesions or rashes        Assessment & Plan:

## 2012-01-11 NOTE — Assessment & Plan Note (Addendum)
Stable disease. She does not know how to ask questions or answer them. It seems she prefers spiriva or tudorza but unable to afford it and has regret over settling for neb. I have assured her repeatedly she is not settling for something inferior. I have aslo told her to write down her questions or concerns before doc visits and learn to answer questions directly  PLAN #COPD - stable disease  - we are NOT making changes anymore to your medication regimen - use albuterol neb and atrovent neb 4 times daily - use albuterol inhaler or nebulizer as needed for emergence  - use QVAR inhaler 2  puff twice daily -  #SMoking  - please quit smoking  #Followup 1 month with NP to do med calendar again and  see how medications are working In future, please write down all questions for the doctor before you come

## 2012-01-11 NOTE — Patient Instructions (Addendum)
#  COPD - stable disease  - we are NOT making changes anymore to your medication regimen - use albuterol neb and atrovent neb 4 times daily - use albuterol inhaler or nebulizer as needed for emergence  - use QVAR inhaler 2  puff twice daily -  #SMoking  - please quit smoking  #Followup 1 month with NP to do med calendar again and  see how medications are working In future, please write down all questions for the doctor before you come

## 2012-01-13 ENCOUNTER — Encounter (HOSPITAL_COMMUNITY): Payer: Medicare Other

## 2012-01-18 ENCOUNTER — Encounter (HOSPITAL_COMMUNITY)
Admission: RE | Admit: 2012-01-18 | Discharge: 2012-01-18 | Disposition: A | Discharge status: Home / Self Care | Payer: Medicare Other | Source: Ambulatory Visit | Attending: Internal Medicine | Admitting: Internal Medicine

## 2012-01-20 ENCOUNTER — Encounter (HOSPITAL_COMMUNITY): Payer: Medicare Other

## 2012-01-21 ENCOUNTER — Telehealth: Payer: Self-pay | Admitting: Internal Medicine

## 2012-01-21 NOTE — Telephone Encounter (Signed)
Spoke with pt and wants to stay on pro air will call [facility] on Monday .

## 2012-01-24 MED ORDER — IPRATROPIUM BROMIDE 0.02 % IN SOLN: 500.0000 ug | Freq: Four times a day (QID) | RESPIRATORY_TRACT | Status: DC

## 2012-01-24 NOTE — Telephone Encounter (Signed)
LMTCB x 1 for the pt  

## 2012-01-24 NOTE — Telephone Encounter (Signed)
Spoke with [facility] and they state that the pt sent them a written requeswt for Ventolin instead of Proairr. Will contact the pt to get clarification before confirming with the pharmacy. Home # was busy and mobile # only rang. Will try to contact the pt again later.

## 2012-01-24 NOTE — Telephone Encounter (Signed)
Pt states she is fine with switching the the cheaper Ventolin HFA. She also states she does not have any Atrovent for her nebulizer and wants this called to the pharmacy for her . Prescriptions sent to the pharmacy.

## 2012-01-25 ENCOUNTER — Encounter (HOSPITAL_COMMUNITY)
Admission: RE | Admit: 2012-01-25 | Discharge: 2012-01-25 | Disposition: A | Discharge status: Home / Self Care | Payer: Medicare Other | Source: Ambulatory Visit | Attending: Internal Medicine | Admitting: Internal Medicine

## 2012-01-27 ENCOUNTER — Encounter (HOSPITAL_COMMUNITY)
Admission: RE | Admit: 2012-01-27 | Discharge: 2012-01-27 | Disposition: A | Discharge status: Home / Self Care | Payer: Medicare Other | Source: Ambulatory Visit | Attending: Internal Medicine | Admitting: Internal Medicine

## 2012-01-27 DIAGNOSIS — J449 Chronic obstructive pulmonary disease, unspecified: Secondary | ICD-10-CM | POA: Insufficient documentation

## 2012-01-27 DIAGNOSIS — R079 Chest pain, unspecified: Secondary | ICD-10-CM | POA: Insufficient documentation

## 2012-01-27 DIAGNOSIS — F17200 Nicotine dependence, unspecified, uncomplicated: Secondary | ICD-10-CM | POA: Insufficient documentation

## 2012-01-27 DIAGNOSIS — I10 Essential (primary) hypertension: Secondary | ICD-10-CM | POA: Insufficient documentation

## 2012-01-27 DIAGNOSIS — Z5189 Encounter for other specified aftercare: Secondary | ICD-10-CM | POA: Insufficient documentation

## 2012-01-27 DIAGNOSIS — J4489 Other specified chronic obstructive pulmonary disease: Secondary | ICD-10-CM | POA: Insufficient documentation

## 2012-01-27 DIAGNOSIS — Z01811 Encounter for preprocedural respiratory examination: Secondary | ICD-10-CM | POA: Insufficient documentation

## 2012-01-27 DIAGNOSIS — E785 Hyperlipidemia, unspecified: Secondary | ICD-10-CM | POA: Insufficient documentation

## 2012-02-01 ENCOUNTER — Encounter (HOSPITAL_COMMUNITY)
Admission: RE | Admit: 2012-02-01 | Discharge: 2012-02-01 | Disposition: A | Discharge status: Home / Self Care | Payer: Medicare Other | Source: Ambulatory Visit | Attending: Internal Medicine | Admitting: Internal Medicine

## 2012-02-03 ENCOUNTER — Encounter (HOSPITAL_COMMUNITY)
Admission: RE | Admit: 2012-02-03 | Discharge: 2012-02-03 | Disposition: A | Discharge status: Home / Self Care | Payer: Medicare Other | Source: Ambulatory Visit | Attending: Internal Medicine | Admitting: Internal Medicine

## 2012-02-08 ENCOUNTER — Encounter (HOSPITAL_COMMUNITY)
Admission: RE | Admit: 2012-02-08 | Discharge: 2012-02-08 | Disposition: A | Discharge status: Home / Self Care | Payer: Medicare Other | Source: Ambulatory Visit | Attending: Internal Medicine | Admitting: Internal Medicine

## 2012-02-10 ENCOUNTER — Encounter (HOSPITAL_COMMUNITY)
Admission: RE | Admit: 2012-02-10 | Discharge: 2012-02-10 | Disposition: A | Discharge status: Home / Self Care | Payer: Medicare Other | Source: Ambulatory Visit | Attending: Internal Medicine | Admitting: Internal Medicine

## 2012-02-11 ENCOUNTER — Ambulatory Visit (INDEPENDENT_AMBULATORY_CARE_PROVIDER_SITE_OTHER): Payer: Medicare Other | Admitting: Adult Health

## 2012-02-11 ENCOUNTER — Encounter: Payer: Self-pay | Admitting: Adult Health

## 2012-02-11 ENCOUNTER — Other Ambulatory Visit (HOSPITAL_COMMUNITY): Payer: Self-pay | Admitting: Neurological Surgery

## 2012-02-11 VITALS — BP 126/68 | HR 82 | Temp 97.3°F | Ht 64.0 in | Wt 150.0 lb

## 2012-02-11 DIAGNOSIS — J449 Chronic obstructive pulmonary disease, unspecified: Secondary | ICD-10-CM

## 2012-02-11 NOTE — Patient Instructions (Addendum)
Follow med calendar closely and bring to each visit.  Begin Pulmicort Neb Twice daily  . (This replaces QVAR )  Take QVAR until Pulmicort arrives . Then stop Pulmicort .  Begin Atrovent and Albuterol neb Four times a day   Very important to quit smoking.  Follow up 6 weeks , bring all neb meds.

## 2012-02-13 NOTE — Assessment & Plan Note (Addendum)
Patient's medications were reviewed today and patient education was given. Computerized medication calendar was adjusted/completed Will change over to pulmicort neb to see if more affordable.  Can not afford spiriva/tudorza.  Send nebs thru DME to see if more cost effective   Plan  Follow med calendar closely and bring to each visit.  Begin Pulmicort Neb Twice daily  . (This replaces QVAR )  Take QVAR until Pulmicort arrives . Then stop Pulmicort .  Begin Atrovent and Albuterol neb Four times a day   Very important to quit smoking.  Follow up 6 weeks , bring all neb meds.

## 2012-02-13 NOTE — Progress Notes (Signed)
Subjective:    Patient ID: Nicole Maxwell, female    DOB: Aug 21, 1945, 67 y.o.   MRN: [id number]  HPI #COPD - Oxygen dependent since 2011  - Doubtamine stress echo 05/12/10 - normal, CXR July 2011 - clear  - PFTs 04/13/2010 - shows Gold stage 4 COPD with BD response/asthma component (fev1 0.5L/29%, 10% BD response on fev1 and 38% on FC), DLCO 27%)  - Spirometry Jan 2013 - fev1 0.74L/37%, RAtop 44 - shows gold stage 3 copd  - Class 2-3 exertional dyspnea with precipitants emotion, smoke, lying flat, weather change, pollen, odors  - Chronic cough with white sputjum  - CAT score - 23 in May 2013 - CAT score 19 in NOv 2013  #Smoking  #Chronic back pain - s/p lumbar fusion surgery ? Date  - Preop pulmonary evaluatio prior to spine surgery - 2013, surgery by Dr Danielle Dess never happend  #There is no history of allergies, aspirin allergies, asthma, bronchiolitis, CAD, DVT, a heart failure, PE, pneumonia  # Poor ability to understand questions and answer them. Difficulty following med advice  - noticed since march 2013  OV 01/11/2012    Presents with husband. Totally unclear again what her main issue is. ON CAT score is seems ithe baseline range of 18-23 with score today being 22. She admits to no changein baseline symptoms . She showed her med calendraw and swore she is on albuterol + atrovent scheduled + qvar mdi 2 puff bid. But she again points to [place] and spiriva as having worke well for her. But when I asked her if she wants to switch to them she has no answer. She says "well it is too costly". I asked her if nebss not working well for her and she could not answer the question one way or another.  Of note, she continues to attend rehab  Repeat pattern of not answering questions directly nor asking questions. Her husband too chided her for this. No one in office can understand what her concerns are. I have asked her to write questions down in future before doc visit   Still smoking: Unable to  quit   CAT COPD Symptom and Quality of Life Score (glaxo smith kline trademark)  06/11/11 10/08/2011  12/08/2011  01/11/2012   Never Cough -> Cough all the time 1 1 3 2   No phlegm in chest -> Chest is full of phlegm 2 1 2 2   No chest tightness -> Chest feels very tight 1 1 1 2   No dyspnea for 1 flight stairs/hill -> Very dyspneic for 1 flight of stairs 5 4 4 4   No limitations for ADL at home -> Very limited with ADL at home 4 4 3 3   Confident leaving home -> Not at all confident leaving home 5 1 0 2  Sleep soundly -> Do not sleep soundly because of lung condition 2 3 3 4   Lots of Energy -> No energy at all 3 3 3 3   TOTAL Score (max 40)  23 18 19 22      02/11/12 follow up  Returns for follow up and med review  pt brought all meds with her today.  c/o nebs being too expensive thru mailorder Pt gets confused very easily with detail. Unclear if she is taking med correctly .  She says mail order is very expensive for nebs . Inhalers were also too expensive .  She agreed to try DME to see if more cost effective No flare in cough or  dyspnea.  No edema.    Past, Family, Social reviewed: no change since last visit   Review of Systems  Constitutional: Negative for fever and unexpected weight change.  HENT: Negative for ear pain, nosebleeds, congestion, sore throat, rhinorrhea, sneezing, trouble swallowing, dental problem, postnasal drip and sinus pressure.   Eyes: Negative for redness and itching.  Respiratory: Positive for cough and shortness of breath. Negative for chest tightness and wheezing.   Cardiovascular: Negative for palpitations and leg swelling.  Gastrointestinal: Negative for nausea and vomiting.  Genitourinary: Negative for dysuria.  Musculoskeletal: Negative for joint swelling.  Skin: Negative for rash.  Neurological: Negative for headaches.  Hematological: Does not bruise/bleed easily.  Psychiatric/Behavioral: Negative for dysphoric mood. The patient is not  nervous/anxious.        Objective:   Physical Exam GEN: A/Ox3; pleasant , NAD  HEENT:  [place]/AT,  EACs-clear, TMs-wnl, NOSE-clear, THROAT-clear, no lesions, no postnasal drip or exudate noted.   NECK:  Supple w/ fair ROM; no JVD; normal carotid impulses w/o bruits; no thyromegaly or nodules palpated; no lymphadenopathy.  RESP  Coarse BS  NO WHEEZE No rales/ or rhonchi.no accessory muscle use, no dullness to percussion  CARD:  RRR, no m/r/g  , no peripheral edema, pulses intact, no cyanosis or clubbing.  GI:   Soft & nt; nml bowel sounds; no organomegaly or masses detected.  Musco: Warm bil, no deformities or joint swelling noted.   Neuro: alert, no focal deficits noted.    Skin: Warm, no lesions or rashes        Assessment & Plan:

## 2012-02-14 ENCOUNTER — Telehealth: Payer: Self-pay | Admitting: Adult Health

## 2012-02-14 DIAGNOSIS — J449 Chronic obstructive pulmonary disease, unspecified: Secondary | ICD-10-CM

## 2012-02-14 MED ORDER — BUDESONIDE 0.25 MG/2ML IN SUSP: 0.2500 mg | Freq: Two times a day (BID) | RESPIRATORY_TRACT | Status: DC

## 2012-02-14 MED ORDER — ALBUTEROL SULFATE (2.5 MG/3ML) 0.083% IN NEBU: 2.5000 mg | INHALATION_SOLUTION | Freq: Four times a day (QID) | RESPIRATORY_TRACT | Status: DC

## 2012-02-14 MED ORDER — IPRATROPIUM BROMIDE 0.02 % IN SOLN: 500.0000 ug | Freq: Four times a day (QID) | RESPIRATORY_TRACT | Status: AC

## 2012-02-14 NOTE — Telephone Encounter (Signed)
Pt seen for med calendar w/ TP 1.17.14: Patient Instructions     Follow med calendar closely and bring to each visit.  Begin Pulmicort Neb Twice daily . (This replaces QVAR )  Take QVAR until Pulmicort arrives . Then stop Pulmicort .  Begin Atrovent and Albuterol neb Four times a day  Very important to quit smoking.  Follow up 6 weeks , bring all neb meds.    ============================= Called [facility] @ [phone] and spoke with Jeralyn Bennett w/ pharmacy.  Advised that pt's albuterol and ipratropium are to be cancelled.  Jeralyn Bennett verbalized his understanding.  Order placed to [facility] for new start nebs: albuterol and ipratropium QID, pulmicort neb BID.  Rx's printed, signed by TP and placed in [facility] lookat.

## 2012-02-15 ENCOUNTER — Encounter (HOSPITAL_COMMUNITY)
Admission: RE | Admit: 2012-02-15 | Discharge: 2012-02-15 | Disposition: A | Discharge status: Home / Self Care | Payer: Medicare Other | Source: Ambulatory Visit | Attending: Internal Medicine | Admitting: Internal Medicine

## 2012-02-16 NOTE — Progress Notes (Signed)
Pulmonary Rehabilitation Program Outcomes Report   Orientation:  11/02/2011 Graduate Date:  02/15/2012 Discharge Date:  02/15/2012 # of sessions completed: 24  Pulmonologist: Ramaswamy Class Time:  10:30am   A.  Exercise Program:  Tolerates exercise @ 2.68 METS for 45 minutes, Walk Test Results:  Pre: 850 ft and Post: 1,180 ft, Improved functional capacity  38.82 %, Improved  muscular strength  66.7 %, Decreased dyspnea score +113.5 which is worse than pre score of 37 but that could be because patient did not properly fill out the pre paperwork. %, Improved education score 25.00 %, Exercise limited by dyspnea, Needs encouragement on exercise program, Discharged to home exercise program.  Anticipated compliance:  fair and Discharged  B.  Mental Health:  Health related anxiety, Significant family stress and Quality of Life (QOL)  improvements:  Overall  +100.74 %, Health/Functioning +161.25 %, Socioeconomics +43.75 %, Psych/Spiritual +75.00 %, Family +100.00 %    C.  Education/Instruction/Skills  Uses Perceived Exertion Scale and/or Dyspnea Scale and Attended 10 education classes  Demonstrates accurate diaphragmatic breathing and pursed lip breathing. Home exercise given 11/16/2011  D.  Nutrition/Weight Control/Body Composition: There are many areas needed for improvement in pt diet for pt diet to become healthier.  Pt wt maintained during rehab. BMI 26.5, % body fat 38.5%. Pt with 1.3%  increase in % body fat. Section Completed by: Mickle Plumb, M.Ed, RD, LDN, CDE    E.  Blood Lipids No results found for this basename: CHOL, HDL, LDLCALC, LDLDIRECT, TRIG, CHOLHDL   F.  Lifestyle Changes:   G.  Symptoms noted with exercise:  Shortness of breath, Dizziness, Nausea, Resting hypertension and Exertional hypertension  Report Completed By:  Frederik Schmidt. Allred, MS, NASM, CES  Comments:  Mrs. Bolon has completed the pulmonary rehab undergrad program and did well.  She increased workloads as tolerated. VSS. Mets level increased over time. Patient had a goal to stop smoking completely but did not. Staff was proud that she cut back. Mrs. Vogt improved so much with her breathing, understanding of her meds, setting goals and accomplishing them, and also her quality of life ( as noted in the scores above). Patient very satisfied with her outcome and thanked [place] for all we did for her. Patient plans to return to the pulmonary maintenance rehab program in a few months after she takes care of personal issues. She is aware to call first to make sure we get a MD order. Overall, she had great attendance and it was noticeable that she was doing her home exercises. As you can see on the session details scanned in her oxygen saturations maintained at 97% or higher on 2L NCC. Very delightful lady,    Agree with the above  Cathie Olden RN

## 2012-02-23 ENCOUNTER — Other Ambulatory Visit: Payer: Self-pay | Admitting: Internal Medicine

## 2012-02-29 ENCOUNTER — Telehealth: Payer: Self-pay | Admitting: Internal Medicine

## 2012-02-29 MED ORDER — DOXYCYCLINE HYCLATE 100 MG PO TABS: 100.0000 mg | ORAL_TABLET | Freq: Two times a day (BID) | ORAL | Status: DC

## 2012-02-29 MED ORDER — PREDNISONE 10 MG PO TABS: ORAL_TABLET | ORAL | Status: DC

## 2012-02-29 NOTE — Telephone Encounter (Signed)
rx's sent Pt is aware Will sign off

## 2012-02-29 NOTE — Telephone Encounter (Signed)
Spoke with patient, states she feels her meds are not working.  Awakening her in the night, having an increased cough, hoarseness, and feels the nebulizers are cutting her breath short. Would like MR to send something in to pharmacy.  Patient scheduled to see MR tomorrow (03/01/12 @330pm ).  I have advised patient to come on in to her appt tomorrow and get it all straightened then. If something is needed emergent throughout the night to seek emergency help.  Patient verbalized understanding and nothing further needed at this time.  Will route to MR as Lorain Childes

## 2012-02-29 NOTE — Telephone Encounter (Signed)
You have mild attack of copd called COPD exacerbation Please take doxycycline 100mg  twice daily after meals x 5 days; avoid sunlight Please take prednisone 40mg  once daily x 3 days, then 20mg  once daily x 3 days, then 10mg  once daily x 3 days, then 5mg  once dailyx 3 days and stop

## 2012-02-29 NOTE — Telephone Encounter (Signed)
I spoke with pt and she is scheduled to come in and see MR at 3:30 for acute visit. Nothing further was needed

## 2012-03-01 ENCOUNTER — Ambulatory Visit: Payer: Medicare Other | Admitting: Internal Medicine

## 2012-03-13 ENCOUNTER — Other Ambulatory Visit: Payer: Self-pay | Admitting: Internal Medicine

## 2012-03-24 ENCOUNTER — Encounter: Payer: Self-pay | Admitting: Adult Health

## 2012-03-24 ENCOUNTER — Ambulatory Visit (INDEPENDENT_AMBULATORY_CARE_PROVIDER_SITE_OTHER): Payer: Medicare Other | Admitting: Adult Health

## 2012-03-24 VITALS — BP 100/60 | HR 82 | Temp 98.2°F | Ht 66.5 in | Wt 143.8 lb

## 2012-03-24 DIAGNOSIS — J441 Chronic obstructive pulmonary disease with (acute) exacerbation: Secondary | ICD-10-CM

## 2012-03-24 NOTE — Progress Notes (Signed)
Subjective:    Patient ID: Nicole Maxwell, female    DOB: [birth year], 67 y.o.   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 Date  - Preop pulmonary evaluatio prior to spine surgery - 2013, surgery by Dr Danielle Dess never happend  #There is no history of allergies, aspirin allergies, asthma, bronchiolitis, CAD, DVT, a heart failure, PE, pneumonia  # Poor ability to understand questions and answer them. Difficulty following med advice  - noticed since march 2013  OV 01/11/2012    Presents with husband. Totally unclear again what her main issue is. ON CAT score is seems ithe baseline range of 18-23 with score today being 22. She admits to no changein baseline symptoms . She showed her med calendraw and swore she is on albuterol + atrovent scheduled + qvar mdi 2 puff bid. But she again points to [place] and spiriva as having worke well for her. But when I asked her if she wants to switch to them she has no answer. She says "well it is too costly". I asked her if nebss not working well for her and she could not answer the question one way or another.  Of note, she continues to attend rehab  Repeat pattern of not answering questions directly nor asking questions. Her husband too chided her for this. No one in office can understand what her concerns are. I have asked her to write questions down in future before doc visit   Still smoking: Unable to  quit   CAT COPD Symptom and Quality of Life Score (glaxo smith kline trademark)  06/11/11 10/08/2011  12/08/2011  01/11/2012   Never Cough -> Cough all the time 1 1 3 2   No phlegm in chest -> Chest is full of phlegm 2 1 2 2   No chest tightness -> Chest feels very tight 1 1 1 2   No dyspnea for 1 flight stairs/hill -> Very dyspneic for 1 flight of stairs 5 4 4 4   No limitations for ADL at home -> Very limited with ADL at home 4 4 3 3   Confident leaving home -> Not at all confident leaving home 5 1 0 2  Sleep soundly -> Do not sleep soundly because of lung condition 2 3 3 4   Lots of Energy -> No energy at all 3 3 3 3   TOTAL Score (max 40)  23 18 19 22      02/11/12 follow up  Returns for follow up and med review  pt brought all meds with her today.  c/o nebs being too expensive thru mailorder Pt gets confused very easily with detail. Unclear if she is taking med correctly .  She says mail order is very expensive for nebs . Inhalers were also too expensive .  She agreed to try DME to see if more cost effective No flare in cough or  dyspnea.  No edema.  >>retrial of pulmicort   03/24/2012 Follow up COPD  Returns 6 week for her COPD Last visit. We restarted a trial of Pulmicort. Her nebulizers Unable to tolerate budesonide feels it make her worse.  She is Still smoking and does not have any interest in quitting smoking We discussed the dangers of smoking and also smoking around oxygen. She denies any hemoptysis, orthopnea, PND, or leg swelling She had a recent COPD flare with bronchitis. 3 weeks ago and was given prednisone and doxycycline, which seemed to help her symptoms.    Past, Family, Social reviewed: no change since last visit   Review of Systems  Constitutional: Negative for fever and unexpected weight change.  HENT: Negative for ear pain, nosebleeds, congestion, sore throat, rhinorrhea, sneezing, trouble swallowing, dental problem, postnasal drip and sinus pressure.   Eyes:  Negative for redness and itching.  Respiratory: Positive for cough and shortness of breath. Negative for chest tightness and wheezing.   Cardiovascular: Negative for palpitations and leg swelling.  Gastrointestinal: Negative for nausea and vomiting.  Genitourinary: Negative for dysuria.  Musculoskeletal: Negative for joint swelling.  Skin: Negative for rash.  Neurological: Negative for headaches.  Hematological: Does not bruise/bleed easily.  Psychiatric/Behavioral: Negative for dysphoric mood. The patient is not nervous/anxious.        Objective:   Physical Exam GEN: A/Ox3; pleasant , NAD  HEENT:  [place]/AT,  EACs-clear, TMs-wnl, NOSE-clear, THROAT-clear, no lesions, no postnasal drip or exudate noted.   NECK:  Supple w/ fair ROM; no JVD; normal carotid impulses w/o bruits; no thyromegaly or nodules palpated; no lymphadenopathy.  RESP few rhonchi .no accessory muscle use, no dullness to percussion  CARD:  RRR, no m/r/g  , no peripheral edema, pulses intact, no cyanosis or clubbing.  GI:   Soft & nt; nml bowel sounds; no organomegaly or masses detected.  Musco: Warm bil, no deformities or joint swelling noted.   Neuro: alert, no focal deficits noted.    Skin: Warm, no lesions or rashes        Assessment & Plan:

## 2012-03-24 NOTE — Addendum Note (Signed)
Addended by: Boone Master E on: 03/24/2012 12:34 PM   Modules accepted: Orders, Medications

## 2012-03-24 NOTE — Assessment & Plan Note (Signed)
Recurrent exacerbation in a patient who continues to smoke AGAINST MEDICAL ADVICE. Patient may continue on albuterol and ipratropium 4 times daily. She has been intolerant to alternative medications, including budesonide, [place] Advised on smoking cessation Return in 3 months and as needed

## 2012-03-24 NOTE — Patient Instructions (Addendum)
Continue on albuterol and ipratropium nebulizer 4 times daily. Most important for is to quit smoking. Very important to remember that there is absolutely no smoking in the home with her- This oxygen is extremely dangerous. Follow up Dr. Marchelle Gearing in 3 months and as needed

## 2012-04-05 ENCOUNTER — Inpatient Hospital Stay (HOSPITAL_COMMUNITY)
Admission: EM | Admit: 2012-04-05 | Discharge: 2012-04-09 | DRG: 190 | Disposition: A | Discharge status: Home / Self Care | Payer: Medicare Other | Attending: Internal Medicine | Admitting: Internal Medicine

## 2012-04-05 ENCOUNTER — Telehealth: Payer: Self-pay | Admitting: Pulmonary Disease

## 2012-04-05 ENCOUNTER — Emergency Department (HOSPITAL_COMMUNITY): Payer: Medicare Other

## 2012-04-05 ENCOUNTER — Telehealth: Payer: Self-pay | Admitting: Internal Medicine

## 2012-04-05 ENCOUNTER — Encounter (HOSPITAL_COMMUNITY): Payer: Self-pay | Admitting: Emergency Medicine

## 2012-04-05 DIAGNOSIS — J9620 Acute and chronic respiratory failure, unspecified whether with hypoxia or hypercapnia: Secondary | ICD-10-CM | POA: Diagnosis present

## 2012-04-05 DIAGNOSIS — E119 Type 2 diabetes mellitus without complications: Secondary | ICD-10-CM | POA: Diagnosis present

## 2012-04-05 DIAGNOSIS — E559 Vitamin D deficiency, unspecified: Secondary | ICD-10-CM | POA: Diagnosis present

## 2012-04-05 DIAGNOSIS — Z981 Arthrodesis status: Secondary | ICD-10-CM

## 2012-04-05 DIAGNOSIS — E871 Hypo-osmolality and hyponatremia: Secondary | ICD-10-CM | POA: Diagnosis not present

## 2012-04-05 DIAGNOSIS — I10 Essential (primary) hypertension: Secondary | ICD-10-CM | POA: Diagnosis present

## 2012-04-05 DIAGNOSIS — D72829 Elevated white blood cell count, unspecified: Secondary | ICD-10-CM | POA: Diagnosis present

## 2012-04-05 DIAGNOSIS — F17200 Nicotine dependence, unspecified, uncomplicated: Secondary | ICD-10-CM | POA: Diagnosis present

## 2012-04-05 DIAGNOSIS — F1011 Alcohol abuse, in remission: Secondary | ICD-10-CM | POA: Diagnosis present

## 2012-04-05 DIAGNOSIS — M129 Arthropathy, unspecified: Secondary | ICD-10-CM | POA: Diagnosis present

## 2012-04-05 DIAGNOSIS — E785 Hyperlipidemia, unspecified: Secondary | ICD-10-CM | POA: Diagnosis present

## 2012-04-05 DIAGNOSIS — F329 Major depressive disorder, single episode, unspecified: Secondary | ICD-10-CM | POA: Diagnosis present

## 2012-04-05 DIAGNOSIS — Z803 Family history of malignant neoplasm of breast: Secondary | ICD-10-CM

## 2012-04-05 DIAGNOSIS — F3289 Other specified depressive episodes: Secondary | ICD-10-CM | POA: Diagnosis present

## 2012-04-05 DIAGNOSIS — T380X5A Adverse effect of glucocorticoids and synthetic analogues, initial encounter: Secondary | ICD-10-CM | POA: Diagnosis present

## 2012-04-05 DIAGNOSIS — I739 Peripheral vascular disease, unspecified: Secondary | ICD-10-CM | POA: Diagnosis present

## 2012-04-05 DIAGNOSIS — J441 Chronic obstructive pulmonary disease with (acute) exacerbation: Secondary | ICD-10-CM | POA: Diagnosis present

## 2012-04-05 DIAGNOSIS — F411 Generalized anxiety disorder: Secondary | ICD-10-CM | POA: Diagnosis present

## 2012-04-05 DIAGNOSIS — Z8249 Family history of ischemic heart disease and other diseases of the circulatory system: Secondary | ICD-10-CM

## 2012-04-05 LAB — BASIC METABOLIC PANEL
BUN: 11 mg/dL (ref 6–23)
Creatinine, Ser: 0.62 mg/dL (ref 0.50–1.10)
GFR calc Af Amer: >90 mL/min (ref >90)
GFR calc non Af Amer: >90 mL/min (ref >90)
Potassium: 3.7 mEq/L (ref 3.5–5.1)

## 2012-04-05 LAB — CBC
MCHC: 31.5 g/dL (ref 30.0–36.0)
Platelets: 158 10*3/uL (ref 150–400)
RDW: 13.4 % (ref 11.5–15.5)

## 2012-04-05 LAB — POCT I-STAT TROPONIN I: Troponin i, poc: 0 ng/mL (ref 0.00–0.08)

## 2012-04-05 LAB — PRO B NATRIURETIC PEPTIDE: Pro B Natriuretic peptide (BNP): 429.1 pg/mL — ABNORMAL HIGH (ref 0–125)

## 2012-04-05 MED ORDER — HYDROCODONE-ACETAMINOPHEN 5-325 MG PO TABS
1.0000 | ORAL_TABLET | Freq: Once | ORAL | Status: AC
  Administered 2012-04-06: 1 via ORAL
  Filled 2012-04-05: qty 1

## 2012-04-05 MED ORDER — IPRATROPIUM BROMIDE 0.02 % IN SOLN
0.5000 mg | Freq: Once | RESPIRATORY_TRACT | Status: AC
  Administered 2012-04-05: 0.5 mg via RESPIRATORY_TRACT
  Filled 2012-04-05: qty 2.5

## 2012-04-05 MED ORDER — PREDNISONE 10 MG PO TABS: ORAL_TABLET | ORAL | Status: DC

## 2012-04-05 MED ORDER — DOXYCYCLINE HYCLATE 100 MG PO TABS: 100.0000 mg | ORAL_TABLET | Freq: Two times a day (BID) | ORAL | Status: DC

## 2012-04-05 MED ORDER — ALBUTEROL SULFATE (5 MG/ML) 0.5% IN NEBU: 5.0000 mg | INHALATION_SOLUTION | Freq: Once | RESPIRATORY_TRACT | Status: DC

## 2012-04-05 MED ORDER — PREDNISONE 20 MG PO TABS
40.0000 mg | ORAL_TABLET | Freq: Once | ORAL | Status: AC
  Administered 2012-04-05: 40 mg via ORAL
  Filled 2012-04-05: qty 2

## 2012-04-05 MED ORDER — ALBUTEROL SULFATE (5 MG/ML) 0.5% IN NEBU
5.0000 mg | INHALATION_SOLUTION | Freq: Once | RESPIRATORY_TRACT | Status: AC
  Administered 2012-04-05: 5 mg via RESPIRATORY_TRACT
  Filled 2012-04-05: qty 1

## 2012-04-05 MED ORDER — DOXYCYCLINE HYCLATE 100 MG PO TABS
100.0000 mg | ORAL_TABLET | Freq: Once | ORAL | Status: AC
  Administered 2012-04-06: 100 mg via ORAL
  Filled 2012-04-05: qty 1

## 2012-04-05 NOTE — Telephone Encounter (Signed)
Called, spoke with pt's daughter, Byrd Hesselbach.  Informed her of below per MR.  She will inform pt.  She is aware rxs have been called in and to have pt keep OV with VS tomorrow but seek emergency care if symptoms worsen or do not get better.  Nothing further needed at this time.

## 2012-04-05 NOTE — Telephone Encounter (Signed)
You have  attack of copd called COPD exacerbation Please take doxycycline 100mg  twice daily after meals x 5 days; avoid sunlight Please take prednisone 40mg  once daily x 3 days, then 20mg  once daily x 3 days, then 10mg  once daily x 3 days, then 5mg  once dailyx 3 days and stop  These medications will need at least half a day t to work If she is not feeling better or getting worse at any time she should go to the emergency room. Otherwise should come and see Dr Coralyn Helling tomorrow or Rikki Spearing

## 2012-04-05 NOTE — Telephone Encounter (Signed)
Pt states that she needs something called in to help w/ her SOB Asap.  Nicole Maxwell

## 2012-04-05 NOTE — ED Notes (Addendum)
BIB [facility]. Called for sudden onset SOB. Unable to utilize nebulizer. Unable to ambulate without dyspnea. Productive cough with thick clear sputum. Sinus tach. Home O2 2L at night. Transport VSS. DuoNeb given by EMS

## 2012-04-05 NOTE — ED Provider Notes (Signed)
History     CSN: [id number]  Arrival date & time 04/05/12  4098   First MD Initiated Contact with Patient 04/05/12 1925      Chief Complaint  Patient presents with  . Shortness of Breath    (Consider location/radiation/quality/duration/timing/severity/associated sxs/prior treatment) Patient is a 67 y.o. female presenting with shortness of breath. The history is provided by the patient. No language interpreter was used.  Shortness of Breath Severity:  Moderate Onset quality:  Gradual Duration:  3 days Timing:  Constant Progression:  Waxing and waning Chronicity:  Recurrent Context: activity   Relieved by:  Rest Worsened by:  Exertion and movement Ineffective treatments:  Inhaler Associated symptoms: chest pain, cough, fever, sputum production and wheezing   Associated symptoms: no abdominal pain, no diaphoresis, no headaches, no hemoptysis, no neck pain, no sore throat, no syncope and no vomiting   Chest pain:    Quality:  Dull   Severity:  Mild   Onset quality:  Sudden   Duration: seconds.   Timing:  Intermittent   Progression:  Unchanged   Chronicity:  New Cough:    Cough characteristics:  Productive   Sputum characteristics: white.   Severity:  Moderate   Onset quality:  Gradual   Duration:  3 days   Timing:  Constant   Progression:  Worsening   Chronicity:  Recurrent Fever:    Timing:  Intermittent   Temp source:  Subjective   Progression:  Unchanged Wheezing:    Severity:  Moderate   Onset quality:  Gradual   Duration:  3 days   Timing:  Intermittent   Progression:  Waxing and waning Risk factors: tobacco use   Risk factors: no recent alcohol use, no prolonged immobilization and no recent surgery     Past Medical History  Diagnosis Date  . Hypertension   . Hyperlipidemia   . Chronic back pain   . Osteopenia   . PVD (peripheral vascular disease)   . Tobacco abuse     " I am addicted"   . Vitamin D deficiency disease     03/26/2010 - 9.9ng/mL.  Started on replacemnt  . Alcohol abuse, in remission quit 2004  . COPD (chronic obstructive pulmonary disease)     cxr 04/03/2010 - hyperinflation  . Diabetes mellitus     on metaformin  . Neuromuscular disorder     lumbar disc  . Anxiety     Xanax prn  . Shortness of breath     with anxiety  and activty  . Arthritis     BACK , KNEES  . Asthma   . Hyperlipidemia     Past Surgical History  Procedure Laterality Date  . Spinal fusion    . Appendectomy      AGE 36  . Tonsillectomy      AGE 36  . Spinal fusion  2002  . Cholecystectomy      PT NOT SURE    Family History  Problem Relation Age of Onset  . Heart attack Sister 46    CABG  . Breast cancer Sister   . Cancer Brother   . Heart attack Brother   . Hypertension Other     all siblings  . Anesthesia problems Neg Hx     History  Substance Use Topics  . Smoking status: Current Every Day Smoker -- 0.50 packs/day for 40 years    Types: Cigarettes  . Smokeless tobacco: Not on file  . Alcohol Use: No  Comment: h/o heavy ETOH quit 2004    OB History   Grav Para Term Preterm Abortions TAB SAB Ect Mult Living                  Review of Systems  Constitutional: Positive for fever. Negative for chills, diaphoresis, activity change, appetite change and fatigue.  HENT: Negative for congestion, sore throat, facial swelling, rhinorrhea, neck pain and neck stiffness.   Eyes: Negative for photophobia and discharge.  Respiratory: Positive for cough, sputum production, shortness of breath and wheezing. Negative for hemoptysis and chest tightness.   Cardiovascular: Positive for chest pain. Negative for palpitations, leg swelling and syncope.  Gastrointestinal: Negative for nausea, vomiting, abdominal pain and diarrhea.  Endocrine: Negative for polydipsia and polyuria.  Genitourinary: Negative for dysuria, frequency, difficulty urinating and pelvic pain.  Musculoskeletal: Negative for back pain and arthralgias.  Skin:  Negative for color change and wound.  Allergic/Immunologic: Negative for immunocompromised state.  Neurological: Negative for facial asymmetry, weakness, numbness and headaches.  Hematological: Does not bruise/bleed easily.  Psychiatric/Behavioral: Negative for confusion and agitation.    Allergies  Brovana and Budesonide  Home Medications   Current Outpatient Rx  Name  Route  Sig  Dispense  Refill  . albuterol (PROVENTIL HFA;VENTOLIN HFA) 108 (90 BASE) MCG/ACT inhaler   Inhalation   Inhale 2 puffs into the lungs every 4 (four) hours as needed.          Marland Kitchen albuterol (PROVENTIL) (2.5 MG/3ML) 0.083% nebulizer solution   Nebulization   Take 2.5 mg by nebulization 4 (four) times daily. (may take 1 every 4 hours as needed for wheezing/shortness of breath) DX: 496         . alprazolam (XANAX) 2 MG tablet   Oral   Take 1 mg by mouth every 8 (eight) hours as needed.          Marland Kitchen aspirin 325 MG tablet   Oral   Take 325 mg by mouth daily.           Marland Kitchen HYDROcodone-acetaminophen (NORCO/VICODIN) 5-325 MG per tablet   Oral   Take 1 tablet by mouth every 6 (six) hours as needed for pain.          Marland Kitchen ipratropium (ATROVENT) 0.02 % nebulizer solution   Nebulization   Take 2.5 mLs (500 mcg total) by nebulization 4 (four) times daily. DX:  496   300 mL   5   . losartan-hydrochlorothiazide (HYZAAR) 100-25 MG per tablet   Oral   Take 1 tablet by mouth daily.           . metFORMIN (GLUCOPHAGE) 500 MG tablet   Oral   Take 500 mg by mouth 2 (two) times daily with a meal.           . predniSONE (DELTASONE) 10 MG tablet      Take 4 tabs for 3 days, 2 tabs for 3 days, 1 tab for 3 days, 0.5 tab for 3 days then stop   20 tablet   0   . rosuvastatin (CRESTOR) 5 MG tablet   Oral   Take 5 mg by mouth daily.           Marland Kitchen doxycycline (VIBRA-TABS) 100 MG tablet   Oral   Take 1 tablet (100 mg total) by mouth 2 (two) times daily.   10 tablet   0     BP 139/73  Pulse 95   Temp(Src) 98 F (36.7 C) (Oral)  Resp 18  SpO2 99%  Physical Exam  Constitutional: She is oriented to person, place, and time. She appears well-developed and well-nourished. No distress.  HENT:  Head: Normocephalic and atraumatic.  Mouth/Throat: No oropharyngeal exudate.  Eyes: Pupils are equal, round, and reactive to light.  Neck: Normal range of motion. Neck supple.  Cardiovascular: Normal rate, regular rhythm and normal heart sounds.  Exam reveals no gallop and no friction rub.   No murmur heard. Pulmonary/Chest: Tachypnea noted. No respiratory distress. She has decreased breath sounds in the right upper field, the right middle field, the right lower field, the left upper field, the left middle field and the left lower field. She has wheezes in the right upper field, the right middle field, the right lower field, the left upper field, the left middle field and the left lower field. She has no rales.  Abdominal: Soft. Bowel sounds are normal. She exhibits no distension and no mass. There is no tenderness. There is no rebound and no guarding.  Musculoskeletal: Normal range of motion. She exhibits no edema and no tenderness.  Neurological: She is alert and oriented to person, place, and time.  Skin: Skin is warm and dry.  Psychiatric: She has a normal mood and affect.    ED Course  Procedures (including critical care time)  Labs Reviewed  BASIC METABOLIC PANEL - Abnormal; Notable for the following:    Glucose, Bld 138 (*)    All other components within normal limits  CBC - Abnormal; Notable for the following:    WBC 11.3 (*)    All other components within normal limits  PRO B NATRIURETIC PEPTIDE - Abnormal; Notable for the following:    Pro B Natriuretic peptide (BNP) 429.1 (*)    All other components within normal limits  POCT I-STAT TROPONIN I   Dg Chest 2 View (if Patient Has Fever And/or Copd)  04/05/2012  *RADIOLOGY REPORT*  Clinical Data: Short of breath  CHEST - 2 VIEW   Comparison: Prior chest x-ray 08/18/2011  Findings: No edema, pleural effusion, pneumothorax or new focal airspace consolidation.  Similar appearance of pulmonary hyperexpansion, chronic bronchitic changes and chronic bibasilar scarring.  The cardiac and mediastinal contours are within normal limits.  There is atherosclerosis in the transverse aorta.  No acute osseous abnormality.  IMPRESSION:  1.  No acute cardiopulmonary disease. 2.  COPD with chronic bibasilar scarring 3.  Aortic atherosclerosis   Original Report Authenticated By: Malachy Moan, M.D.      1. COPD exacerbation      Date: 04/05/2012  Rate: 97  Rhythm: sinus rhythm  QRS Axis: Borderline  Intervals: normal  ST/T Wave abnormalities: low voltate,   Conduction Disutrbances:R atrial abnormality  Narrative Interpretation:   Old EKG Reviewed: unchanged    MDM  Pt is a 67 y.o. female with pertinent PMHX of COPD, DM, HTN, HLD  who presents with 2-3 days inc SOB, inc cough w/ white sputum, subjective fever & chills, and a grabbing sensation in her chest lasting several mins when her breathing worsens.  Pt was called in rx for prednisone & doxy today, but respiratory symptoms worsened this evening and she was directed to ED by on call pulmonologist.  Upon arrival pt 88% on RA with decreased air mvmt throughout, mild tachycardia and RR 22.  EKG unchanged from prior. Pt given albuterol/ipraprotium neb, and felt improved, but had no improvement of oxygenation and dropped to mid 80's at rest on room air.  Trop negative,  mild BNP elevation.  No acute CXR findings. Prednisone, PO doxy given as well as second albuterol [facility] called for admission for COPD exacerbation.     1. COPD exacerbation      Labs and imaging considered in decision making, reviewed by myself.  Imaging interpreted by radiology. Pt care discussed with my attending, Dr. Bernette Mayers.         Toy Cookey, MD 04/06/12 [phone]

## 2012-04-05 NOTE — Telephone Encounter (Signed)
Message that I closed in error is copied below:    Earlyne Iba Description: 67 year old female  04/05/2012 Telephone Provider: Kalman Shan, MD  MRN: [id number] Department: [facility]            Reason for Call    req rx           Call Documentation    Antionette Fairy at 04/05/2012 3:46 PM    Status: Signed             Pt states that she needs something called in to help w/ her SOB Asap. Antionette Fairy              Encounter MyChart Messages    No messages in this encounter         Routing History    Priority Sent On From To Message Type    04/05/2012 3:44 PM Antionette Fairy [facility] Triage Pool Patient Calls    04/05/2012 1:57 PM Lehman Prom [facility] Triage Pool Patient Calls      Created by    Lehman Prom on 04/05/2012 01:56 PM                           Visit Pharmacy    [id number] - [place], [place] - [address] AT [facility] OF [address]         Contacts      Type Contact Phone   04/05/2012 1:56 PM Phone (Incoming) Cendejas,William (Emergency Contact) [phone] (H)

## 2012-04-05 NOTE — Telephone Encounter (Signed)
Called, spoke with pt.  C/o increased SOB over the last couple of days.  States she can be sleeping and wake up with this feeling and has it throughout the day.  Also reports "a lot" of wheezing, some chest tightness, prod cough with clear mucus, and hot and cold flashes but denies fever.  States she cannot use the ipratropium because it makes her feel more SOB.  Is using albuterol neb 4-5 times daily and "thinks it works."  Using the albuterol hfa approx 4-5 times daily as well with not much relief.  Pt states she "started to go to the hospital last night" d/t her symptoms but did not go.  We have scheduled pt to see Dr. Craige Cotta tomorrow evening at 3:45 pm (first available opening tomorrow - TP not in office).  Pt states she may end up in the ER today if she feels like she did again last night.  Will route to MR for further recs.  MR, pls advise on your recs for pt between now and pending OV with VS tomorrow evening.  Thank you.

## 2012-04-05 NOTE — ED Notes (Signed)
Pt lying in the bed dropped her sats to 84%. Pt placed back on 2L of O2 and pt's sats now at 96%. MD made aware

## 2012-04-05 NOTE — Telephone Encounter (Signed)
Duplicate msg as this msg was closed.  Pls see other msg from today for additional information.

## 2012-04-06 ENCOUNTER — Ambulatory Visit: Payer: Medicare Other | Admitting: Pulmonary Disease

## 2012-04-06 DIAGNOSIS — E119 Type 2 diabetes mellitus without complications: Secondary | ICD-10-CM | POA: Diagnosis present

## 2012-04-06 DIAGNOSIS — R0602 Shortness of breath: Secondary | ICD-10-CM

## 2012-04-06 LAB — GLUCOSE, CAPILLARY: Glucose-Capillary: 105 mg/dL — ABNORMAL HIGH (ref 70–99)

## 2012-04-06 MED ORDER — ALBUTEROL SULFATE (5 MG/ML) 0.5% IN NEBU
2.5000 mg | INHALATION_SOLUTION | RESPIRATORY_TRACT | Status: DC
  Administered 2012-04-06 – 2012-04-09 (×19): 2.5 mg via RESPIRATORY_TRACT
  Filled 2012-04-06 (×18): qty 0.5

## 2012-04-06 MED ORDER — LOSARTAN POTASSIUM-HCTZ 100-25 MG PO TABS
1.0000 | ORAL_TABLET | Freq: Every day | ORAL | Status: DC
Start: 2012-04-06 — End: 2012-04-06

## 2012-04-06 MED ORDER — ALBUTEROL SULFATE (5 MG/ML) 0.5% IN NEBU: 2.5000 mg | INHALATION_SOLUTION | RESPIRATORY_TRACT | Status: DC | PRN

## 2012-04-06 MED ORDER — ALPRAZOLAM 0.5 MG PO TABS: 1.0000 mg | ORAL_TABLET | Freq: Three times a day (TID) | ORAL | Status: DC | PRN

## 2012-04-06 MED ORDER — INSULIN ASPART 100 UNIT/ML ~~LOC~~ SOLN
0.0000 [IU] | Freq: Three times a day (TID) | SUBCUTANEOUS | Status: DC
Start: 2012-04-06 — End: 2012-04-09
  Administered 2012-04-06: 3 [IU] via SUBCUTANEOUS
  Administered 2012-04-07: 2 [IU] via SUBCUTANEOUS
  Administered 2012-04-07: 3 [IU] via SUBCUTANEOUS
  Administered 2012-04-09: 2 [IU] via SUBCUTANEOUS

## 2012-04-06 MED ORDER — ONDANSETRON HCL 4 MG PO TABS: 4.0000 mg | ORAL_TABLET | Freq: Four times a day (QID) | ORAL | Status: DC | PRN

## 2012-04-06 MED ORDER — POLYETHYLENE GLYCOL 3350 17 G PO PACK
17.0000 g | PACK | Freq: Every day | ORAL | Status: DC | PRN
  Filled 2012-04-06: qty 1

## 2012-04-06 MED ORDER — HYDROCHLOROTHIAZIDE 25 MG PO TABS
25.0000 mg | ORAL_TABLET | Freq: Every day | ORAL | Status: DC
  Administered 2012-04-06 – 2012-04-08 (×3): 25 mg via ORAL
  Filled 2012-04-06 (×3): qty 1

## 2012-04-06 MED ORDER — ACETAMINOPHEN 325 MG PO TABS: 650.0000 mg | ORAL_TABLET | Freq: Four times a day (QID) | ORAL | Status: DC | PRN

## 2012-04-06 MED ORDER — ATORVASTATIN CALCIUM 10 MG PO TABS
10.0000 mg | ORAL_TABLET | Freq: Every day | ORAL | Status: DC
  Administered 2012-04-06 – 2012-04-08 (×3): 10 mg via ORAL
  Filled 2012-04-06 (×4): qty 1

## 2012-04-06 MED ORDER — DOXYCYCLINE HYCLATE 100 MG PO TABS
100.0000 mg | ORAL_TABLET | Freq: Two times a day (BID) | ORAL | Status: DC
  Administered 2012-04-06 – 2012-04-09 (×7): 100 mg via ORAL
  Filled 2012-04-06 (×9): qty 1

## 2012-04-06 MED ORDER — IPRATROPIUM BROMIDE 0.02 % IN SOLN
500.0000 ug | RESPIRATORY_TRACT | Status: DC
  Administered 2012-04-06 – 2012-04-08 (×13): 500 ug via RESPIRATORY_TRACT
  Filled 2012-04-06 (×10): qty 2.5

## 2012-04-06 MED ORDER — DULOXETINE HCL 60 MG PO CPEP
60.0000 mg | ORAL_CAPSULE | Freq: Every day | ORAL | Status: DC
  Administered 2012-04-06 – 2012-04-09 (×4): 60 mg via ORAL
  Filled 2012-04-06 (×4): qty 1

## 2012-04-06 MED ORDER — ONDANSETRON HCL 4 MG/2ML IJ SOLN: 4.0000 mg | Freq: Four times a day (QID) | INTRAMUSCULAR | Status: DC | PRN

## 2012-04-06 MED ORDER — HYDROCODONE-ACETAMINOPHEN 5-325 MG PO TABS
1.0000 | ORAL_TABLET | Freq: Four times a day (QID) | ORAL | Status: DC | PRN
  Administered 2012-04-06 (×2): 1 via ORAL
  Filled 2012-04-06 (×2): qty 1

## 2012-04-06 MED ORDER — ACETAMINOPHEN 650 MG RE SUPP: 650.0000 mg | Freq: Four times a day (QID) | RECTAL | Status: DC | PRN

## 2012-04-06 MED ORDER — ALUM & MAG HYDROXIDE-SIMETH 200-200-20 MG/5ML PO SUSP
30.0000 mL | Freq: Four times a day (QID) | ORAL | Status: DC | PRN
  Filled 2012-04-06: qty 30

## 2012-04-06 MED ORDER — ENOXAPARIN SODIUM 40 MG/0.4ML ~~LOC~~ SOLN
40.0000 mg | Freq: Every day | SUBCUTANEOUS | Status: DC
  Administered 2012-04-06 – 2012-04-09 (×4): 40 mg via SUBCUTANEOUS
  Filled 2012-04-06 (×4): qty 0.4

## 2012-04-06 MED ORDER — INSULIN ASPART 100 UNIT/ML ~~LOC~~ SOLN: 0.0000 [IU] | Freq: Every day | SUBCUTANEOUS | Status: DC

## 2012-04-06 MED ORDER — ASPIRIN 325 MG PO TABS
325.0000 mg | ORAL_TABLET | Freq: Every day | ORAL | Status: DC
Start: 2012-04-06 — End: 2012-04-09
  Administered 2012-04-06 – 2012-04-09 (×4): 325 mg via ORAL
  Filled 2012-04-06 (×4): qty 1

## 2012-04-06 MED ORDER — LOSARTAN POTASSIUM 50 MG PO TABS
100.0000 mg | ORAL_TABLET | Freq: Every day | ORAL | Status: DC
  Administered 2012-04-06 – 2012-04-09 (×4): 100 mg via ORAL
  Filled 2012-04-06 (×4): qty 2

## 2012-04-06 MED ORDER — ZOLPIDEM TARTRATE 5 MG PO TABS: 5.0000 mg | ORAL_TABLET | Freq: Every evening | ORAL | Status: DC | PRN

## 2012-04-06 NOTE — ED Notes (Signed)
Report given and pt transferred to Pod-C bed 23.

## 2012-04-06 NOTE — ED Notes (Signed)
Pt walked to bathroom with family member by side. Pt walking back had trouble breathing. Pt states she was tired and staff assisted patient to room and sat patient down on bed. Pt reconnected to Oxygen 2L [place] and suggested to use bed pan due to SOB. Pt alert and oriented.family at bedside.

## 2012-04-06 NOTE — ED Provider Notes (Signed)
I saw and evaluated the patient, reviewed the resident's note and I agree with the findings and plan.  Agree with EKG interpretation if present.   Pt with COPD, has SOB, wheezing, improved some with nebs, steroids but low sats when walking. She initially wanted to try outpatient management but changed her mind after dyspnea with walking.   Charles B. Bernette Mayers, MD 04/06/12 [phone]

## 2012-04-06 NOTE — H&P (Signed)
Physician Admission History and Physical     PCP:   Kari Baars, MD   Chief Complaint:  Dypsnea   HPI: Nicole Maxwell is an 67 y.o. female. Pt was noted to have increasing dyspnea x 2 days. Called her pulmonologist who prescribed a steroid taper and antibiotics around 5PM on evening of presentation to ED. When her symptoms continued to decline she came to the ED. She was given 2 neb treatments, and when her sats continued to be in the 80s on RA, I was called for admission. In addition, she was given 40mg  prednisone and doxycycline in the ED. CXR unremarkable for PNA. Labs otherwise unremarkable, other than steroid induced leukocytosis.   Review of Systems:  Neg except as noted above   Past Medical History: Past Medical History  Diagnosis Date  . Hypertension   . Hyperlipidemia   . Chronic back pain   . Osteopenia   . PVD (peripheral vascular disease)   . Tobacco abuse     " I am addicted"   . Vitamin D deficiency disease     03/26/2010 - 9.9ng/mL. Started on replacemnt  . Alcohol abuse, in remission quit 2004  . COPD (chronic obstructive pulmonary disease)     cxr 04/03/2010 - hyperinflation  . Diabetes mellitus     on metaformin  . Neuromuscular disorder     lumbar disc  . Anxiety     Xanax prn  . Shortness of breath     with anxiety  and activty  . Arthritis     BACK , KNEES  . Asthma   . Hyperlipidemia    Past Surgical History  Procedure Laterality Date  . Spinal fusion    . Appendectomy      AGE 6  . Tonsillectomy      AGE 6  . Spinal fusion  2002  . Cholecystectomy      PT NOT SURE    Medications: 1)  Cymbalta 60 Mg Cpep (Duloxetine hcl) .Marland Kitchen.. 1 pill daily at supper 2)  Tessalon Perles 100 Mg Caps (Benzonatate) .... Take one capsule every eight hours as needed for cough 3)  Albuterol Sulfate (2.5 Mg/37ml) 0.083% Nebu (Albuterol sulfate) .... One neb every 4-6 hrs as needed 4)  Ventolin Hfa 108 (90 Base) Mcg/act Aers (Albuterol sulfate) .... 2 puffs every  4 hours as needed 5)  Triamcinolone Acetonide 0.5 % Crea (Triamcinolone acetonide) .... Apply to affected area bid 6)  Losartan Potassium-hctz 100-25 Mg Tabs (Losartan potassium-hctz) .Marland Kitchen.. 1 pill daily to replace lisinopril and hctz 7)  Metformin Hcl 500 Mg Tabs (Metformin hcl) .Marland Kitchen.. 1 tab po bid 8)  Xanax 2 Mg Tabs (Alprazolam) .... Take one tablet by mouth every 8 hours as needed 9)  Crestor 5 Mg Tabs (Rosuvastatin calcium) .... One tablet po qd 10)  Aspirin 325 Mg Tabs (Aspirin) .... One po every day  Allergies:   Allergies  Allergen Reactions  . Brovana (Arformoterol Tartrate) Shortness Of Breath and Cough    General intolerance  . Budesonide Shortness Of Breath and Cough    General intolerance    Social History:  reports that she has been smoking Cigarettes.  She has a 20 pack-year smoking history. She does not have any smokeless tobacco history on file. She reports that she does not drink alcohol. Her drug history is not on file.  Family History: Family History  Problem Relation Age of Onset  . Heart attack Sister 100    CABG  .  Breast cancer Sister   . Cancer Brother   . Heart attack Brother   . Hypertension Other     all siblings  . Anesthesia problems Neg Hx     Physical Exam: Filed Vitals:   04/05/12 2255 04/05/12 2315 04/05/12 2345 04/06/12 0000  BP:    139/73  Pulse: 91 97 96 95  Temp: 98 F (36.7 C)     TempSrc: Oral     Resp: 18     SpO2: 99% 96% 99% 99%   Physical Exam:  Gen: AAF in NAD  HEENT: MMM, no icterus, trachea midline, no LAD or JVD Cardio: RRR, no MRG, 2+ radial pulses  Lungs: air movement present throughout w/ insp and exp wheezes present diffusely  Abd: nontender, no masses, good BS  Ext: no clubbing cyanosis or edema  Neuro: AAOx3, no focal weakness, normal reflexes   Labs on Admission:   Recent Labs  04/05/12 1933  NA 141  K 3.7  CL 100  CO2 29  GLUCOSE 138*  BUN 11  CREATININE 0.62  CALCIUM 9.4   No results found for  this basename: AST, ALT, ALKPHOS, BILITOT, PROT, ALBUMIN,  in the last 72 hours No results found for this basename: LIPASE, AMYLASE,  in the last 72 hours  Recent Labs  04/05/12 1933  WBC 11.3*  HGB 13.5  HCT 42.8  MCV 87.2  PLT 158   No results found for this basename: CKTOTAL, CKMB, CKMBINDEX, TROPONINI,  in the last 72 hours No results found for this basename: INR,  PROTIME   No results found for this basename: TSH, T4TOTAL, FREET3, T3FREE, THYROIDAB,  in the last 72 hours No results found for this basename: VITAMINB12, FOLATE, FERRITIN, TIBC, IRON, RETICCTPCT,  in the last 72 hours  Radiological Exams on Admission: Dg Chest 2 View (if Patient Has Fever And/or Copd)  04/05/2012  *RADIOLOGY REPORT*  Clinical Data: Short of breath  CHEST - 2 VIEW  Comparison: Prior chest x-ray 08/18/2011  Findings: No edema, pleural effusion, pneumothorax or new focal airspace consolidation.  Similar appearance of pulmonary hyperexpansion, chronic bronchitic changes and chronic bibasilar scarring.  The cardiac and mediastinal contours are within normal limits.  There is atherosclerosis in the transverse aorta.  No acute osseous abnormality.  IMPRESSION:  1.  No acute cardiopulmonary disease. 2.  COPD with chronic bibasilar scarring 3.  Aortic atherosclerosis   Original Report Authenticated By: Malachy Moan, M.D.    Orders placed during the hospital encounter of 04/05/12  . ED EKG  . ED EKG    Assessment/Plan Principal Problem:   COPD exacerbation  - Prednisone daily 40mg . Next dose not due until morning of 3/14  - continue doxycycline BID  - alb nebs and ipratroprium nebs q4hrs scheduled   - O2 as needed to keep sats > 92%   Active Problems:   HTN (hypertension)  - cont home meds    Hyperlipidemia  - cont home meds    Diabetes mellitus type 2, noninsulin dependent  - hold metformin in house. SSI  Depression   - cont home meds   PPx   - lovenox      HOLWERDA, SCOTT 04/06/2012,  12:19 AM

## 2012-04-06 NOTE — Telephone Encounter (Signed)
Call returned.  Patient is not available - taken to ED by EMS due to increased dyspnea.

## 2012-04-07 LAB — CBC
HCT: 39.6 % (ref 36.0–46.0)
MCH: 26.7 pg (ref 26.0–34.0)
MCHC: 32.1 g/dL (ref 30.0–36.0)
MCV: 83.4 fL (ref 78.0–100.0)
Platelets: 160 10*3/uL (ref 150–400)
RDW: 13.4 % (ref 11.5–15.5)
WBC: 7.5 10*3/uL (ref 4.0–10.5)

## 2012-04-07 LAB — BASIC METABOLIC PANEL
BUN: 13 mg/dL (ref 6–23)
CO2: 32 mEq/L (ref 19–32)
Calcium: 9.1 mg/dL (ref 8.4–10.5)
Chloride: 96 mEq/L (ref 96–112)
Creatinine, Ser: 0.68 mg/dL (ref 0.50–1.10)

## 2012-04-07 LAB — GLUCOSE, CAPILLARY

## 2012-04-07 MED ORDER — PREDNISONE 20 MG PO TABS
40.0000 mg | ORAL_TABLET | Freq: Every day | ORAL | Status: DC
  Administered 2012-04-07 – 2012-04-08 (×2): 40 mg via ORAL
  Filled 2012-04-07 (×4): qty 2

## 2012-04-07 MED ORDER — METFORMIN HCL 500 MG PO TABS
500.0000 mg | ORAL_TABLET | Freq: Two times a day (BID) | ORAL | Status: DC
  Administered 2012-04-07 – 2012-04-09 (×5): 500 mg via ORAL
  Filled 2012-04-07 (×8): qty 1

## 2012-04-07 NOTE — Progress Notes (Signed)
Report  Was called and pt family was informed

## 2012-04-07 NOTE — Progress Notes (Signed)
Subjective: History reviewed with patient.  Increased dyspnea at rest, wheezing and sputum production.  No significant improvement since admission.  She has great difficulty with her medications- cost of some (Spiriva) have been prohibitive.  Others (Pulmicort) have caused perceived worsening of her dyspnea.  With multiple med changes she has had difficulty keeping up with which medications she is supposed to take (despite very clear medication calendars provided by pulmonology).  She reports compliance with her Albuterol and Atrovent at home but was not prescribed any other medications at this time.  On 2L at home.  Objective: Vital signs in last 24 hours: Temp:  [98 F (36.7 C)-98.8 F (37.1 C)] 98.4 F (36.9 C) (03/14 0630) Pulse Rate:  [87-93] 88 (03/14 0630) Resp:  [18-21] 18 (03/14 0630) BP: (138-141)/(64-89) 138/89 mmHg (03/14 0630) SpO2:  [96 %-100 %] 99 % (03/14 0630) Weight change:     CBG (last 3)   Recent Labs  04/06/12 1148 04/06/12 1744 04/06/12 2213  GLUCAP 105* 107* 120*    Intake/Output from previous day:   Intake/Output this shift:    General appearance: alert and chronically ill, dyspneic at rest Eyes: no scleral icterus Throat: oropharynx moist without erythema Resp: bilateral expiratory wheezing, decreased breath sounds Cardio: regular rate and rhythm GI: soft, non-tender; bowel sounds normal; no masses,  no organomegaly Extremities: no clubbing, cyanosis or edema   Lab Results:  Recent Labs  04/05/12 1933  NA 141  K 3.7  CL 100  CO2 29  GLUCOSE 138*  BUN 11  CREATININE 0.62  CALCIUM 9.4     Recent Labs  04/05/12 1933  WBC 11.3*  HGB 13.5  HCT 42.8  MCV 87.2  PLT 158    Studies/Results: Dg Chest 2 View (if Patient Has Fever And/or Copd)  04/05/2012  *RADIOLOGY REPORT*  Clinical Data: Short of breath  CHEST - 2 VIEW  Comparison: Prior chest x-ray 08/18/2011  Findings: No edema, pleural effusion, pneumothorax or new focal airspace  consolidation.  Similar appearance of pulmonary hyperexpansion, chronic bronchitic changes and chronic bibasilar scarring.  The cardiac and mediastinal contours are within normal limits.  There is atherosclerosis in the transverse aorta.  No acute osseous abnormality.  IMPRESSION:  1.  No acute cardiopulmonary disease. 2.  COPD with chronic bibasilar scarring 3.  Aortic atherosclerosis   Original Report Authenticated By: Malachy Moan, M.D.      Medications: Scheduled: . albuterol  2.5 mg Nebulization Q4H  . aspirin  325 mg Oral Daily  . atorvastatin  10 mg Oral q1800  . doxycycline  100 mg Oral BID  . DULoxetine  60 mg Oral Daily  . enoxaparin (LOVENOX) injection  40 mg Subcutaneous Daily  . losartan  100 mg Oral Daily   And  . hydrochlorothiazide  25 mg Oral Daily  . insulin aspart  0-15 Units Subcutaneous TID WC  . insulin aspart  0-5 Units Subcutaneous QHS  . ipratropium  500 mcg Nebulization Q4H  . predniSONE  40 mg Oral Q breakfast   Continuous:   Assessment/Plan: Principal Problem: 1. COPD exacerbation- she has severe COPD (Gold Stage IV) at baseline with response to therapy limited by insight into her medication regimen and limited access to medications due to cost.  Will ask Care Management to help with getting medications at home.  Would prefer for her to be on an ICS (QVAR would be best since Pulmicort is perceived as causing her symptoms to worsen) +/- Spiriva.  Continue oral steroids,  Doxycycline, scheduled Albuterol/Atrovent for acute exacerbation.  Consider pulmonology consult if no improvement as she is followed closely by Dr. Marchelle Gearing as outpatient.  Active Problems: 2. HTN (hypertension)- adequate control.  Continue current treatment. 3. Hyperlipidemia- continue Lipitor. 4. Diabetes mellitus type 2, noninsulin dependent- mild steroid induced hyperglycemia as anticipated.  Continue SSI.  Will resume Metformin. 5. Disposition- anticipate discharge in 2-3 days if  stable.   LOS: 2 days   SHAW,W DOUGLAS 04/07/2012, 7:39 AM

## 2012-04-07 NOTE — Clinical Documentation Improvement (Signed)
RESPIRATORY FAILURE DOCUMENTATION CLARIFICATION QUERY   THIS DOCUMENT IS NOT A PERMANENT PART OF THE MEDICAL RECORD  TO RESPOND TO THE THIS QUERY, FOLLOW THE INSTRUCTIONS BELOW:  1. If needed, update documentation for the patient's encounter via the notes activity.  2. Access this query again and click edit on the In [employer].  3. After updating, or not, click F2 to complete all highlighted (required) fields concerning your review. Select "additional documentation in the medical record" OR "no additional documentation provided".  4. Click Sign note button.  5. The deficiency will fall out of your In Basket *Please let us know if you are not able to complete this workflow by phone or e-mail (listed below).  Please update your documentation within the medical record to reflect your response to this query.                                                                                    04/07/12  Dear [facility] Marton Redwood,  In a better effort to capture your patient's severity of illness, reflect appropriate length of stay and utilization of resources, a review of the patient medical record has revealed the following indicators. Based on your clinical judgment, please clarify and document in a progress note and/or discharge summary the clinical condition associated with the following supporting information: In responding to this query please exercise your independent judgment.  The fact that a query is asked, does not imply that any particular answer is desired or expected.  Possible Clinical Conditions?  Acute Respiratory Failure  Acute on Chronic Respiratory Failure  Other Condition  Cannot Clinically Determine   Supporting Information:  Risk Factors: COPD, tobacco abuse, lung age >84 years per spirometry report; Very severe obstruction, with low vital capacity per spirometry report by history  Signs&Symptoms: per ED note: Upon arrival pt 88% on RA with  decreased air mvmt throughout, mild tachycardia and RR 22. 2-3 days inc SOB, inc cough w/ white sputum, subjective fever & chills, and a grabbing sensation in her chest lasting several mins when her breathing worsens. Pt was called in rx for prednisone & doxy today, but respiratory symptoms worsened this evening and she was directed to ED by on call pulmonologist. Upon arrival pt 88% on RA with decreased air mvmt throughout, mild tachycardia and RR 22. Pt given albuterol/ipraprotium neb, and felt improved, but had no improvement of oxygenation and dropped to mid 80's at rest on room air.  Diagnostics: -Lab: RA sat: mid 80's at rest on room air  Radiology:No acute cardiopulmonary disease. 2. COPD with chronic bibasilar scarring 3. Aortic atherosclerosis  Treatment: O2 at 2 L/min per nasal cannula  Respiratory Treatment: albuterol/ipraprotium nebs  You may use possible, probable, or suspect with inpatient documentation. possible, probable, suspected diagnoses MUST be documented at the time of discharge  Reviewed: additional documentation in the medical record  Thank You,  Amada Kingfisher RN, BSN, CCM   Clinical Documentation Specialist: [email or web address] [facility] Information Management [place]

## 2012-04-08 LAB — GLUCOSE, CAPILLARY
Glucose-Capillary: 111 mg/dL — ABNORMAL HIGH (ref 70–99)
Glucose-Capillary: 173 mg/dL — ABNORMAL HIGH (ref 70–99)

## 2012-04-08 LAB — CBC
HCT: 39.6 % (ref 36.0–46.0)
Hemoglobin: 12.9 g/dL (ref 12.0–15.0)
MCV: 83.2 fL (ref 78.0–100.0)
RBC: 4.76 MIL/uL (ref 3.87–5.11)
RDW: 12.8 % (ref 11.5–15.5)
WBC: 9.5 10*3/uL (ref 4.0–10.5)

## 2012-04-08 LAB — BASIC METABOLIC PANEL
CO2: 30 mEq/L (ref 19–32)
Chloride: 91 mEq/L — ABNORMAL LOW (ref 96–112)
Creatinine, Ser: 0.63 mg/dL (ref 0.50–1.10)
GFR calc Af Amer: >90 mL/min (ref >90)
Potassium: 3.4 mEq/L — ABNORMAL LOW (ref 3.5–5.1)
Sodium: 130 mEq/L — ABNORMAL LOW (ref 135–145)

## 2012-04-08 MED ORDER — OXYMETAZOLINE HCL 0.05 % NA SOLN
1.0000 | Freq: Two times a day (BID) | NASAL | Status: DC
  Administered 2012-04-08 – 2012-04-09 (×3): 1 via NASAL
  Filled 2012-04-08: qty 15

## 2012-04-08 MED ORDER — GUAIFENESIN 100 MG/5ML PO SYRP
200.0000 mg | ORAL_SOLUTION | Freq: Three times a day (TID) | ORAL | Status: DC
  Filled 2012-04-08 (×2): qty 118

## 2012-04-08 MED ORDER — PREDNISONE 20 MG PO TABS
40.0000 mg | ORAL_TABLET | Freq: Two times a day (BID) | ORAL | Status: DC
  Administered 2012-04-08 – 2012-04-09 (×2): 40 mg via ORAL
  Filled 2012-04-08 (×4): qty 2

## 2012-04-08 MED ORDER — GUAIFENESIN 100 MG/5ML PO SOLN
10.0000 mL | Freq: Three times a day (TID) | ORAL | Status: DC
  Administered 2012-04-08 – 2012-04-09 (×3): 200 mg via ORAL
  Filled 2012-04-08 (×5): qty 10

## 2012-04-08 MED ORDER — TIOTROPIUM BROMIDE MONOHYDRATE 18 MCG IN CAPS
18.0000 ug | ORAL_CAPSULE | Freq: Every day | RESPIRATORY_TRACT | Status: DC
  Administered 2012-04-09: 18 ug via RESPIRATORY_TRACT
  Filled 2012-04-08: qty 5

## 2012-04-08 NOTE — Progress Notes (Signed)
Subjective: Continues to have cough, and it is difficult for her to cough up the thick secretions, also having epistaxis. Has dyspnea with any activity.   Objective: Vital signs in last 24 hours: Temp:  [97.9 F (36.6 C)-98.7 F (37.1 C)] 97.9 F (36.6 C) (03/15 0550) Pulse Rate:  [91-102] 91 (03/15 0550) Resp:  [18-22] 18 (03/15 0550) BP: (144-156)/(70-86) 152/74 mmHg (03/15 0550) SpO2:  [96 %-100 %] 98 % (03/15 0828) Weight change:    Intake/Output from previous day: 03/14 0701 - 03/15 0700 In: 840 [P.O.:840] Out: -    General appearance: alert, cooperative, no distress and has occasional dry cough Resp: bilateral scattered wheezing, and no use of accessory muscles of respiration Cardio: regular rate and rhythm, S1, S2 normal, no murmur, click, rub or gallop GI: soft, non-tender; bowel sounds normal; no masses,  no organomegaly Extremities: extremities normal, atraumatic, no cyanosis or edema  Lab Results:  Recent Labs  04/07/12 0650 04/08/12 0639  WBC 7.5 9.5  HGB 12.7 12.9  HCT 39.6 39.6  PLT 160 166   BMET  Recent Labs  04/07/12 0650 04/08/12 0639  NA 137 130*  K 3.6 3.4*  CL 96 91*  CO2 32 30  GLUCOSE 110* 104*  BUN 13 16  CREATININE 0.68 0.63  CALCIUM 9.1 8.9   CMET CMP     Component Value Date/Time   NA 130* 04/08/2012 0639   K 3.4* 04/08/2012 0639   CL 91* 04/08/2012 0639   CO2 30 04/08/2012 0639   GLUCOSE 104* 04/08/2012 0639   BUN 16 04/08/2012 0639   CREATININE 0.63 04/08/2012 0639   CALCIUM 8.9 04/08/2012 0639   GFRNONAA >90 04/08/2012 0639   GFRAA >90 04/08/2012 0639    CBG (last 3)   Recent Labs  04/07/12 1215 04/07/12 1632 04/08/12 0759  GLUCAP 123* 160* 96    INR RESULTS:   No results found for this basename: INR, PROTIME     Studies/Results: No results found.  Medications: I have reviewed the patient's current medications.  Assessment/Plan: #1 COPD exacerbation: continued moderate sxs with thick sputum. Will increase  prednisone, change atrovent nebs to spiriva, add robitussin to regimen, if doing better may be ready for discharge to home in 24-48 hours. Will rediscuss option of chantix as outpatient to help with smoking cessation.  #2 DM2: stable on SSI despite prednisone.   LOS: 3 days   Devone Tousley G 04/08/2012, 12:17 PM

## 2012-04-09 DIAGNOSIS — E871 Hypo-osmolality and hyponatremia: Secondary | ICD-10-CM | POA: Diagnosis not present

## 2012-04-09 LAB — CBC
Hemoglobin: 13.5 g/dL (ref 12.0–15.0)
MCV: 81.3 fL (ref 78.0–100.0)
Platelets: 154 10*3/uL (ref 150–400)
RBC: 4.96 MIL/uL (ref 3.87–5.11)
WBC: 7.7 10*3/uL (ref 4.0–10.5)

## 2012-04-09 LAB — BASIC METABOLIC PANEL
CO2: 27 mEq/L (ref 19–32)
Calcium: 9 mg/dL (ref 8.4–10.5)
Potassium: 4.3 mEq/L (ref 3.5–5.1)
Sodium: 122 mEq/L — ABNORMAL LOW (ref 135–145)

## 2012-04-09 LAB — GLUCOSE, CAPILLARY
Glucose-Capillary: 123 mg/dL — ABNORMAL HIGH (ref 70–99)
Glucose-Capillary: 121 mg/dL — ABNORMAL HIGH (ref 70–99)
Glucose-Capillary: 107 mg/dL — ABNORMAL HIGH (ref 70–99)

## 2012-04-09 MED ORDER — DULOXETINE HCL 60 MG PO CPEP: 60.0000 mg | ORAL_CAPSULE | Freq: Every day | ORAL | Status: DC

## 2012-04-09 MED ORDER — TIOTROPIUM BROMIDE MONOHYDRATE 18 MCG IN CAPS: 18.0000 ug | ORAL_CAPSULE | Freq: Every day | RESPIRATORY_TRACT | Status: DC

## 2012-04-09 MED ORDER — GUAIFENESIN 100 MG/5ML PO SOLN: 10.0000 mL | Freq: Three times a day (TID) | ORAL | Status: DC

## 2012-04-09 MED ORDER — PREDNISONE 20 MG PO TABS: 40.0000 mg | ORAL_TABLET | Freq: Every day | ORAL | Status: AC

## 2012-04-09 NOTE — Discharge Summary (Addendum)
Physician Discharge Summary  Patient ID: AHNYLA MENDEL MRN: [id number] DOB/AGE: 07-30-1945 67 y.o.  Admit date: 04/05/2012 Discharge date: 04/09/2012   Discharge Diagnoses:  Principal Problem:   COPD exacerbation Active Problems:   Hyponatremia   HTN (hypertension)   Hyperlipidemia   Diabetes mellitus type 2, noninsulin dependent   Discharged Condition: good  Hospital Course:   Nicole Maxwell is an 67 y.o. female. Pt was noted to have increasing dyspnea x 2 days. Called her pulmonologist who prescribed a steroid taper and antibiotics around 5PM on evening of presentation to ED. When her symptoms continued to decline she came to the ED. She was given 2 neb treatments, and when her sats continued to be in the 80s on RA, I was called for admission. In addition, she was given 40mg  prednisone and doxycycline in the ED. CXR unremarkable for PNA. Labs otherwise unremarkable, other than steroid induced leukocytosis.   She was treated with high dose prednisone and high dose albuterol with atrovent nebs and her condition improved significantly so that she did not need oxygen at rest by the day of discharge. She was able to cough up sputum better with robitussin added to her regimen. On the day of discharge her sodium had decreased, but she felt fine, so she will be discharged with advice to hold losartan HCT for now. She was advised to consider re-trying chantix to stop smoking, or at least switch from regular cigarettes to electronic cigarettes to help her COPD and breathing upon discharge.   Consults: None  Significant Diagnostic Studies:  No results found.  Labs: Lab Results  Component Value Date   WBC 7.7 04/09/2012   HGB 13.5 04/09/2012   HCT 40.3 04/09/2012   MCV 81.3 04/09/2012   PLT 154 04/09/2012     Recent Labs Lab 04/09/12 0536  NA 122*  K 4.3  CL 83*  CO2 27  BUN 16  CREATININE 0.56  CALCIUM 9.0  GLUCOSE 134*       No results found for this basename: INR, PROTIME     No  results found for this or any previous visit (from the past 240 hour(s)).    Discharge Exam: Blood pressure 139/75, pulse 82, temperature 98.4 F (36.9 C), temperature source Oral, resp. rate 18, height 5\' 4"  (1.626 m), weight 65.1 kg (143 lb 8.3 oz), SpO2 99.00%.  Physical Exam: In general she is a well developed black woman who had an occasional dry cough, but no dyspnea while sitting up off of oxygen. HEENT exam was normal, Chest had minimal scattered wheezing with no use of accessory muscles of respiration, heart had a regular rate and rhythm, abdomen was normal, and extremities were without edema. She was alert and well oriented with a normal affect and no focal neurologic signs.   Disposition:  She will be discharged to home today with followup later this week with her primary care physician.  Discharge Orders   Future Appointments Provider Department Dept Phone   06/29/2012 9:30 AM Newt Lukes, MD [facility] [phone]   Future Orders Complete By Expires     Call MD for:  As directed     Comments:      Call for worsening breathing difficulty, fever, chills, nausea, or other concerning symptoms.    Diet - low sodium heart healthy  As directed     Discharge instructions  As directed     Comments:      You should complete  course of prednisone as prescribed. You should either use Spiriva once daily (preferred) or Ipratropium bromide nebulized treatments four times daily. Should have BMET blood test done this week at followup visit with Dr. Clelia Croft.    Increase activity slowly  As directed         Medication List    STOP taking these medications       losartan-hydrochlorothiazide 100-25 MG per tablet  Commonly known as:  HYZAAR      TAKE these medications       albuterol 108 (90 BASE) MCG/ACT inhaler  Commonly known as:  PROVENTIL HFA;VENTOLIN HFA  Inhale 2 puffs into the lungs every 4 (four) hours as needed.     albuterol (2.5 MG/3ML) 0.083%  nebulizer solution  Commonly known as:  PROVENTIL  Take 2.5 mg by nebulization 4 (four) times daily. (may take 1 every 4 hours as needed for wheezing/shortness of breath) DX: 496     alprazolam 2 MG tablet  Commonly known as:  XANAX  Take 1 mg by mouth every 8 (eight) hours as needed.     aspirin 325 MG tablet  Take 325 mg by mouth daily.     doxycycline 100 MG tablet  Commonly known as:  VIBRA-TABS  Take 1 tablet (100 mg total) by mouth 2 (two) times daily.     DULoxetine 60 MG capsule  Commonly known as:  CYMBALTA  Take 1 capsule (60 mg total) by mouth daily.     guaiFENesin 100 MG/5ML Soln  Commonly known as:  ROBITUSSIN  Take 10 mLs (200 mg total) by mouth 3 (three) times daily.     HYDROcodone-acetaminophen 5-325 MG per tablet  Commonly known as:  NORCO/VICODIN  Take 1 tablet by mouth every 6 (six) hours as needed for pain.     ipratropium 0.02 % nebulizer solution  Commonly known as:  ATROVENT  Take 2.5 mLs (500 mcg total) by nebulization 4 (four) times daily. DX:  496     metFORMIN 500 MG tablet  Commonly known as:  GLUCOPHAGE  Take 500 mg by mouth 2 (two) times daily with a meal.     predniSONE 20 MG tablet  Commonly known as:  DELTASONE  Take 2 tablets (40 mg total) by mouth daily.     rosuvastatin 5 MG tablet  Commonly known as:  CRESTOR  Take 5 mg by mouth daily.     tiotropium 18 MCG inhalation capsule  Commonly known as:  SPIRIVA  Place 1 capsule (18 mcg total) into inhaler and inhale daily.           Follow-up Information   Follow up with Kari Baars, MD. Schedule an appointment as soon as possible for a visit in 1 week.   Contact information:   [address] [facility] [place] [address] [phone]       Signed: Garlan Fillers 04/09/2012, 1:21 PM  Clarification- please add Acute on Chronic Respiratory Failure as a discharge diagnosis given her Acute respiratory failure requiring hospitalization  superimposed on chronic COPD with O2 requirement.

## 2012-04-17 ENCOUNTER — Other Ambulatory Visit (HOSPITAL_COMMUNITY): Payer: Self-pay | Admitting: Internal Medicine

## 2012-04-17 DIAGNOSIS — Z1231 Encounter for screening mammogram for malignant neoplasm of breast: Secondary | ICD-10-CM

## 2012-05-26 ENCOUNTER — Ambulatory Visit (HOSPITAL_COMMUNITY)
Admission: RE | Admit: 2012-05-26 | Discharge: 2012-05-26 | Disposition: A | Discharge status: Home / Self Care | Payer: Medicare Other | Source: Ambulatory Visit | Attending: Internal Medicine | Admitting: Internal Medicine

## 2012-05-26 DIAGNOSIS — Z1231 Encounter for screening mammogram for malignant neoplasm of breast: Secondary | ICD-10-CM | POA: Insufficient documentation

## 2012-05-31 ENCOUNTER — Telehealth: Payer: Self-pay | Admitting: Internal Medicine

## 2012-05-31 NOTE — Telephone Encounter (Signed)
Called pt x's 3 to make next ov per recall.  Pt never returned calls.  Mailed recall letter 05/31/12. Emily E McAlister °

## 2012-06-29 ENCOUNTER — Ambulatory Visit: Payer: Medicare Other | Admitting: Internal Medicine

## 2012-07-26 ENCOUNTER — Ambulatory Visit: Payer: Medicare Other | Admitting: Internal Medicine

## 2012-08-01 ENCOUNTER — Encounter: Payer: Self-pay | Admitting: Internal Medicine

## 2012-08-01 ENCOUNTER — Ambulatory Visit (INDEPENDENT_AMBULATORY_CARE_PROVIDER_SITE_OTHER): Payer: Medicare Other | Admitting: Internal Medicine

## 2012-08-01 VITALS — BP 120/70 | HR 99 | Temp 100.1°F | Ht 62.0 in | Wt 137.0 lb

## 2012-08-01 DIAGNOSIS — J449 Chronic obstructive pulmonary disease, unspecified: Secondary | ICD-10-CM

## 2012-08-01 MED ORDER — FLUTICASONE FUROATE-VILANTEROL 100-25 MCG/INH IN AEPB: 1.0000 | INHALATION_SPRAY | Freq: Every day | RESPIRATORY_TRACT | Status: DC

## 2012-08-01 NOTE — Assessment & Plan Note (Signed)
Stable byt symptomatic copd. Still smoking. I have no idea anymore what she takes for copd. We had her on nebs but she has brough in spiriva and symbicport in her med bag. She has has multiple med calendar visits. I have given up on knowing her meds. I will try BREO instead of symbicortl will start with samples. Continue other copd meds. Advised again to quit smoking

## 2012-08-01 NOTE — Progress Notes (Signed)
Subjective:    Patient ID: Nicole Maxwell, female    DOB: July 29, 1945, 67 y.o.   MRN: [id number]  HPI HPI #COPD - Oxygen dependent since 2011  - Doubtamine stress echo 05/12/10 - normal, CXR July 2011 - clear  - PFTs 04/13/2010 - shows Gold stage 4 COPD with BD response/asthma component (fev1 0.5L/29%, 10% BD response on fev1 and 38% on FC), DLCO 27%)  - Spirometry Jan 2013 - fev1 0.74L/37%, RAtop 44 - shows gold stage 3 copd  - Class 2-3 exertional dyspnea with precipitants emotion, smoke, lying flat, weather change, pollen, odors  - Chronic cough with white sputjum  - CAT score - 23 in May 2013 - CAT score 19 in NOv 2013  #Smoking  #Chronic back pain - s/p lumbar fusion surgery ? Date  - Preop pulmonary evaluatio prior to spine surgery - 2013, surgery by Dr Danielle Dess never happend  #There is no history of allergies, aspirin allergies, asthma, bronchiolitis, CAD, DVT, a heart failure, PE, pneumonia  # Poor ability to understand questions and answer them. Difficulty following med advice  - noticed since march 2013  OV 01/11/2012    Presents with husband. Totally unclear again what her main issue is. ON CAT score is seems ithe baseline range of 18-23 with score today being 22. She admits to no changein baseline symptoms . She showed her med calendraw and swore she is on albuterol + atrovent scheduled + qvar mdi 2 puff bid. But she again points to [place] and spiriva as having worke well for her. But when I asked her if she wants to switch to them she has no answer. She says "well it is too costly". I asked her if nebss not working well for her and she could not answer the question one way or another.  Of note, she continues to attend rehab  Repeat pattern of not answering questions directly nor asking questions. Her husband too chided her for this. No one in office can understand what her concerns are. I have asked her to write questions down in future before doc visit   Still smoking:  Unable to quit   02/11/12 follow up  Returns for follow up and med review  pt brought all meds with her today.  c/o nebs being too expensive thru mailorder Pt gets confused very easily with detail. Unclear if she is taking med correctly .  She says mail order is very expensive for nebs . Inhalers were also too expensive .  She agreed to try DME to see if more cost effective No flare in cough or dyspnea.  No edema.  >>retrial of pulmicort   03/24/2012 Follow up COPD  Returns 6 week for her COPD Last visit. We restarted a trial of Pulmicort. Her nebulizers Unable to tolerate budesonide feels it make her worse.  She is Still smoking and does not have any interest in quitting smoking We discussed the dangers of smoking and also smoking around oxygen. She denies any hemoptysis, orthopnea, PND, or leg swelling She had a recent COPD flare with bronchitis. 3 weeks ago and was given prednisone and doxycycline, which seemed to help her symptoms.   OV 08/01/2012 FU COPD and smoking  Denies any acout or subacute symptomatology. We had her do med calendar and had her on nebs. But again, she says her copd meds are spiriva and symbicort; she brought these inhalers with her. I cannot figure out from her what her real meds are. She is keen  on getting samples.She says she quit smoking but then her last cigarette was this morning!!!    CAT COPD Symptom and Quality of Life Score ([employer])  06/11/11 10/08/2011  12/08/2011  01/11/2012  08/01/2012   Never Cough -> Cough all the time 1 1 3 2 3   No phlegm in chest -> Chest is full of phlegm 2 1 2 2 2   No chest tightness -> Chest feels very tight 1 1 1 2 2   No dyspnea for 1 flight stairs/hill -> Very dyspneic for 1 flight of stairs 5 4 4 4 5   No limitations for ADL at home -> Very limited with ADL at home 4 4 3 3 5   Confident leaving home -> Not at all confident leaving home 5 1 0 2 2  Sleep soundly -> Do not sleep soundly because of lung  condition 2 3 3 4 3   Lots of Energy -> No energy at all 3 3 3 3 4   TOTAL Score (max 40)  23 18 19 22 26       Past, Family, Social reviewed: husbnad diagnosed with some form of advanced cancer involving brain, liver. S/p xrt and chemo. Now in [facility]. Says that he is still making her angry  Current outpatient prescriptions:albuterol (PROVENTIL HFA;VENTOLIN HFA) 108 (90 BASE) MCG/ACT inhaler, Inhale 2 puffs into the lungs every 4 (four) hours as needed. , Disp: , Rfl: ;  albuterol (PROVENTIL) (2.5 MG/3ML) 0.083% nebulizer solution, Take 2.5 mg by nebulization 4 (four) times daily. (may take 1 every 4 hours as needed for wheezing/shortness of breath) DX: 496, Disp: , Rfl:  alprazolam (XANAX) 2 MG tablet, Take 1 mg by mouth every 8 (eight) hours as needed. , Disp: , Rfl: ;  aspirin 325 MG tablet, Take 325 mg by mouth daily.  , Disp: , Rfl: ;  doxycycline (VIBRA-TABS) 100 MG tablet, Take 1 tablet (100 mg total) by mouth 2 (two) times daily., Disp: 10 tablet, Rfl: 0;  DULoxetine (CYMBALTA) 60 MG capsule, Take 1 capsule (60 mg total) by mouth daily., Disp: 30 capsule, Rfl: 1 guaiFENesin (ROBITUSSIN) 100 MG/5ML SOLN, Take 10 mLs (200 mg total) by mouth 3 (three) times daily., Disp: 1200 mL, Rfl: 2;  HYDROcodone-acetaminophen (NORCO/VICODIN) 5-325 MG per tablet, Take 1 tablet by mouth every 6 (six) hours as needed for pain. , Disp: , Rfl: ;  ipratropium (ATROVENT) 0.02 % nebulizer solution, Take 2.5 mLs (500 mcg total) by nebulization 4 (four) times daily. DX:  496, Disp: 300 mL, Rfl: 5 metFORMIN (GLUCOPHAGE) 500 MG tablet, Take 500 mg by mouth 2 (two) times daily with a meal.  , Disp: , Rfl: ;  rosuvastatin (CRESTOR) 5 MG tablet, Take 5 mg by mouth daily.  , Disp: , Rfl: ;  tiotropium (SPIRIVA) 18 MCG inhalation capsule, Place 1 capsule (18 mcg total) into inhaler and inhale daily., Disp: 30 capsule, Rfl: 1   Review of Systems    all neg except bbaseline copd symptoms and chronic gi symptoms  Objective:    Physical Exam Physical Exam GEN: A/Ox3; pleasant , NAD  HEENT:  [place]/AT,  EACs-clear, TMs-wnl, NOSE-clear, THROAT-clear, no lesions, no postnasal drip or exudate noted.   NECK:  Supple w/ fair ROM; no JVD; normal carotid impulses w/o bruits; no thyromegaly or nodules palpated; no lymphadenopathy.  RESP few rhonchi .no accessory muscle use, no dullness to percussion  CARD:  RRR, no m/r/g  , no peripheral edema, pulses intact, no cyanosis or clubbing.  GI:   Soft & nt; nml bowel sounds; no organomegaly or masses detected.  Musco: Warm bil, no deformities or joint swelling noted.   Neuro: alert, no focal deficits noted.    Skin: Warm, no lesions or rashes           Assessment & Plan:

## 2012-08-01 NOTE — Patient Instructions (Addendum)
Continue your copd medications Try BREO samples once daily instead of symbicort Continue your other copd meidcations Quit smoking Return to see me in 3 months with copd cat score

## 2012-11-08 ENCOUNTER — Encounter: Payer: Self-pay | Admitting: Cardiovascular Disease

## 2012-11-21 ENCOUNTER — Encounter: Payer: Self-pay | Admitting: Internal Medicine

## 2012-11-27 ENCOUNTER — Telehealth: Payer: Self-pay | Admitting: Internal Medicine

## 2012-11-27 NOTE — Telephone Encounter (Signed)
Called Patient to set up follow up apt, Left message x3. No return call back. Sent letter 11/27/12  ° °

## 2013-01-25 HISTORY — PX: CATARACT EXTRACTION W/ INTRAOCULAR LENS  IMPLANT, BILATERAL: SHX1307

## 2013-02-09 ENCOUNTER — Encounter: Payer: Self-pay | Admitting: Cardiology

## 2013-05-02 ENCOUNTER — Other Ambulatory Visit (HOSPITAL_COMMUNITY): Payer: Self-pay | Admitting: Internal Medicine

## 2013-05-02 DIAGNOSIS — Z1231 Encounter for screening mammogram for malignant neoplasm of breast: Secondary | ICD-10-CM

## 2013-06-04 ENCOUNTER — Ambulatory Visit (HOSPITAL_COMMUNITY)
Admission: RE | Admit: 2013-06-04 | Discharge: 2013-06-04 | Disposition: A | Discharge status: Home / Self Care | Payer: Medicare Other | Source: Ambulatory Visit | Attending: Internal Medicine | Admitting: Internal Medicine

## 2013-06-04 DIAGNOSIS — Z1231 Encounter for screening mammogram for malignant neoplasm of breast: Secondary | ICD-10-CM | POA: Insufficient documentation

## 2013-12-11 ENCOUNTER — Inpatient Hospital Stay: Admission: AD | Admit: 2013-12-11 | Payer: Medicare Other | Source: Ambulatory Visit | Admitting: Internal Medicine

## 2014-01-01 ENCOUNTER — Encounter (HOSPITAL_COMMUNITY): Payer: Self-pay | Admitting: General Practice

## 2014-01-01 ENCOUNTER — Inpatient Hospital Stay (HOSPITAL_COMMUNITY)
Admission: EM | Admit: 2014-01-01 | Discharge: 2014-01-09 | DRG: 190 | Disposition: A | Discharge status: Home / Self Care | Payer: Medicare Other | Attending: Internal Medicine | Admitting: Internal Medicine

## 2014-01-01 ENCOUNTER — Emergency Department (HOSPITAL_COMMUNITY): Payer: Medicare Other

## 2014-01-01 DIAGNOSIS — I10 Essential (primary) hypertension: Secondary | ICD-10-CM | POA: Diagnosis present

## 2014-01-01 DIAGNOSIS — Z9049 Acquired absence of other specified parts of digestive tract: Secondary | ICD-10-CM | POA: Diagnosis present

## 2014-01-01 DIAGNOSIS — J449 Chronic obstructive pulmonary disease, unspecified: Secondary | ICD-10-CM | POA: Diagnosis present

## 2014-01-01 DIAGNOSIS — F419 Anxiety disorder, unspecified: Secondary | ICD-10-CM | POA: Diagnosis present

## 2014-01-01 DIAGNOSIS — Z79899 Other long term (current) drug therapy: Secondary | ICD-10-CM | POA: Diagnosis not present

## 2014-01-01 DIAGNOSIS — I739 Peripheral vascular disease, unspecified: Secondary | ICD-10-CM | POA: Diagnosis present

## 2014-01-01 DIAGNOSIS — G8929 Other chronic pain: Secondary | ICD-10-CM | POA: Diagnosis present

## 2014-01-01 DIAGNOSIS — E43 Unspecified severe protein-calorie malnutrition: Secondary | ICD-10-CM | POA: Diagnosis present

## 2014-01-01 DIAGNOSIS — F17210 Nicotine dependence, cigarettes, uncomplicated: Secondary | ICD-10-CM | POA: Diagnosis present

## 2014-01-01 DIAGNOSIS — E440 Moderate protein-calorie malnutrition: Secondary | ICD-10-CM | POA: Diagnosis present

## 2014-01-01 DIAGNOSIS — Z85118 Personal history of other malignant neoplasm of bronchus and lung: Secondary | ICD-10-CM

## 2014-01-01 DIAGNOSIS — M1990 Unspecified osteoarthritis, unspecified site: Secondary | ICD-10-CM | POA: Diagnosis present

## 2014-01-01 DIAGNOSIS — Z7951 Long term (current) use of inhaled steroids: Secondary | ICD-10-CM

## 2014-01-01 DIAGNOSIS — E1165 Type 2 diabetes mellitus with hyperglycemia: Secondary | ICD-10-CM | POA: Diagnosis present

## 2014-01-01 DIAGNOSIS — Z961 Presence of intraocular lens: Secondary | ICD-10-CM | POA: Diagnosis present

## 2014-01-01 DIAGNOSIS — Z9841 Cataract extraction status, right eye: Secondary | ICD-10-CM

## 2014-01-01 DIAGNOSIS — J9620 Acute and chronic respiratory failure, unspecified whether with hypoxia or hypercapnia: Secondary | ICD-10-CM | POA: Diagnosis present

## 2014-01-01 DIAGNOSIS — Z9842 Cataract extraction status, left eye: Secondary | ICD-10-CM | POA: Diagnosis not present

## 2014-01-01 DIAGNOSIS — T380X5A Adverse effect of glucocorticoids and synthetic analogues, initial encounter: Secondary | ICD-10-CM | POA: Diagnosis present

## 2014-01-01 DIAGNOSIS — R059 Cough, unspecified: Secondary | ICD-10-CM

## 2014-01-01 DIAGNOSIS — E872 Acidosis: Secondary | ICD-10-CM | POA: Diagnosis present

## 2014-01-01 DIAGNOSIS — J45909 Unspecified asthma, uncomplicated: Secondary | ICD-10-CM | POA: Diagnosis present

## 2014-01-01 DIAGNOSIS — Z888 Allergy status to other drugs, medicaments and biological substances status: Secondary | ICD-10-CM | POA: Diagnosis not present

## 2014-01-01 DIAGNOSIS — R0602 Shortness of breath: Secondary | ICD-10-CM | POA: Diagnosis present

## 2014-01-01 DIAGNOSIS — J441 Chronic obstructive pulmonary disease with (acute) exacerbation: Secondary | ICD-10-CM | POA: Diagnosis not present

## 2014-01-01 DIAGNOSIS — E785 Hyperlipidemia, unspecified: Secondary | ICD-10-CM | POA: Diagnosis present

## 2014-01-01 DIAGNOSIS — Z981 Arthrodesis status: Secondary | ICD-10-CM

## 2014-01-01 DIAGNOSIS — F329 Major depressive disorder, single episode, unspecified: Secondary | ICD-10-CM | POA: Diagnosis present

## 2014-01-01 DIAGNOSIS — I5032 Chronic diastolic (congestive) heart failure: Secondary | ICD-10-CM | POA: Diagnosis present

## 2014-01-01 DIAGNOSIS — Z720 Tobacco use: Secondary | ICD-10-CM | POA: Diagnosis present

## 2014-01-01 DIAGNOSIS — Z9981 Dependence on supplemental oxygen: Secondary | ICD-10-CM

## 2014-01-01 DIAGNOSIS — R1013 Epigastric pain: Secondary | ICD-10-CM | POA: Diagnosis not present

## 2014-01-01 DIAGNOSIS — E119 Type 2 diabetes mellitus without complications: Secondary | ICD-10-CM

## 2014-01-01 DIAGNOSIS — Z7982 Long term (current) use of aspirin: Secondary | ICD-10-CM | POA: Diagnosis not present

## 2014-01-01 DIAGNOSIS — R05 Cough: Secondary | ICD-10-CM

## 2014-01-01 HISTORY — DX: Dependence on supplemental oxygen: Z99.81

## 2014-01-01 HISTORY — DX: Type 2 diabetes mellitus without complications: E11.9

## 2014-01-01 LAB — COMPREHENSIVE METABOLIC PANEL
ALK PHOS: 59 U/L (ref 39–117)
ALT: 12 U/L (ref 0–35)
AST: 17 U/L (ref 0–37)
Albumin: 3.5 g/dL (ref 3.5–5.2)
Anion gap: 9 (ref 5–15)
BILIRUBIN TOTAL: 0.4 mg/dL (ref 0.3–1.2)
BUN: 10 mg/dL (ref 6–23)
CALCIUM: 9.1 mg/dL (ref 8.4–10.5)
CHLORIDE: 93 meq/L — AB (ref 96–112)
CO2: 35 meq/L — AB (ref 19–32)
Creatinine, Ser: 0.51 mg/dL (ref 0.50–1.10)
GFR calc Af Amer: >90 mL/min (ref >90)
Glucose, Bld: 184 mg/dL — ABNORMAL HIGH (ref 70–99)
POTASSIUM: 4.4 meq/L (ref 3.7–5.3)
SODIUM: 137 meq/L (ref 137–147)
Total Protein: 6.6 g/dL (ref 6.0–8.3)

## 2014-01-01 LAB — CBC WITH DIFFERENTIAL/PLATELET
Basophils Absolute: 0 10*3/uL (ref 0.0–0.1)
Basophils Relative: 0 % (ref 0–1)
EOS ABS: 0.2 10*3/uL (ref 0.0–0.7)
EOS PCT: 2 % (ref 0–5)
HCT: 40.5 % (ref 36.0–46.0)
Hemoglobin: 12.4 g/dL (ref 12.0–15.0)
LYMPHS ABS: 1.4 10*3/uL (ref 0.7–4.0)
Lymphocytes Relative: 13 % (ref 12–46)
MCH: 27.6 pg (ref 26.0–34.0)
MCHC: 30.6 g/dL (ref 30.0–36.0)
MCV: 90 fL (ref 78.0–100.0)
Monocytes Absolute: 0.6 10*3/uL (ref 0.1–1.0)
Monocytes Relative: 5 % (ref 3–12)
Neutro Abs: 9 10*3/uL — ABNORMAL HIGH (ref 1.7–7.7)
Neutrophils Relative %: 80 % — ABNORMAL HIGH (ref 43–77)
PLATELETS: 174 10*3/uL (ref 150–400)
RBC: 4.5 MIL/uL (ref 3.87–5.11)
RDW: 14.1 % (ref 11.5–15.5)
WBC: 11.2 10*3/uL — ABNORMAL HIGH (ref 4.0–10.5)

## 2014-01-01 LAB — I-STAT ARTERIAL BLOOD GAS, ED
Acid-Base Excess: 5 mmol/L — ABNORMAL HIGH (ref 0.0–2.0)
Bicarbonate: 34.3 mEq/L — ABNORMAL HIGH (ref 20.0–24.0)
O2 Saturation: 88 %
PCO2 ART: 68.6 mmHg — AB (ref 35.0–45.0)
PH ART: 7.305 — AB (ref 7.350–7.450)
Patient temperature: 97.9
TCO2: 36 mmol/L (ref 0–100)
pO2, Arterial: 61 mmHg — ABNORMAL LOW (ref 80.0–100.0)

## 2014-01-01 LAB — GLUCOSE, CAPILLARY
GLUCOSE-CAPILLARY: 178 mg/dL — AB (ref 70–99)
Glucose-Capillary: 135 mg/dL — ABNORMAL HIGH (ref 70–99)

## 2014-01-01 LAB — PRO B NATRIURETIC PEPTIDE: Pro B Natriuretic peptide (BNP): 364.2 pg/mL — ABNORMAL HIGH (ref 0–125)

## 2014-01-01 LAB — HEMOGLOBIN A1C
HEMOGLOBIN A1C: 6.2 % — AB (ref <5.7)
Mean Plasma Glucose: 131 mg/dL — ABNORMAL HIGH (ref <117)

## 2014-01-01 LAB — TROPONIN I

## 2014-01-01 MED ORDER — ENOXAPARIN SODIUM 40 MG/0.4ML ~~LOC~~ SOLN
40.0000 mg | SUBCUTANEOUS | Status: DC
  Administered 2014-01-01 – 2014-01-08 (×8): 40 mg via SUBCUTANEOUS
  Filled 2014-01-01 (×10): qty 0.4

## 2014-01-01 MED ORDER — METHYLPREDNISOLONE SODIUM SUCC 125 MG IJ SOLR
60.0000 mg | Freq: Four times a day (QID) | INTRAMUSCULAR | Status: DC
  Administered 2014-01-01 – 2014-01-02 (×2): 60 mg via INTRAVENOUS
  Filled 2014-01-01 (×2): qty 2
  Filled 2014-01-01 (×4): qty 0.96

## 2014-01-01 MED ORDER — DULOXETINE HCL 60 MG PO CPEP
60.0000 mg | ORAL_CAPSULE | Freq: Every day | ORAL | Status: DC
  Administered 2014-01-02: 60 mg via ORAL
  Filled 2014-01-01 (×2): qty 1

## 2014-01-01 MED ORDER — ALPRAZOLAM 0.5 MG PO TABS
1.0000 mg | ORAL_TABLET | Freq: Three times a day (TID) | ORAL | Status: DC | PRN
  Administered 2014-01-02 – 2014-01-09 (×4): 1 mg via ORAL
  Filled 2014-01-01 (×5): qty 2

## 2014-01-01 MED ORDER — INSULIN ASPART 100 UNIT/ML ~~LOC~~ SOLN
0.0000 [IU] | Freq: Three times a day (TID) | SUBCUTANEOUS | Status: DC
Start: 2014-01-01 — End: 2014-01-09
  Administered 2014-01-01: 2 [IU] via SUBCUTANEOUS
  Administered 2014-01-02 (×2): 3 [IU] via SUBCUTANEOUS
  Administered 2014-01-03: 4 [IU] via SUBCUTANEOUS
  Administered 2014-01-03 (×2): 2 [IU] via SUBCUTANEOUS
  Administered 2014-01-04: 8 [IU] via SUBCUTANEOUS
  Administered 2014-01-04 (×2): 2 [IU] via SUBCUTANEOUS
  Administered 2014-01-05: 3 [IU] via SUBCUTANEOUS
  Administered 2014-01-05 – 2014-01-07 (×4): 2 [IU] via SUBCUTANEOUS

## 2014-01-01 MED ORDER — IPRATROPIUM BROMIDE 0.02 % IN SOLN
0.5000 mg | RESPIRATORY_TRACT | Status: DC | PRN
Start: 2014-01-01 — End: 2014-01-02
  Administered 2014-01-01: 0.5 mg via RESPIRATORY_TRACT
  Filled 2014-01-01: qty 2.5

## 2014-01-01 MED ORDER — ALBUTEROL SULFATE (2.5 MG/3ML) 0.083% IN NEBU
2.5000 mg | INHALATION_SOLUTION | Freq: Four times a day (QID) | RESPIRATORY_TRACT | Status: DC | PRN
  Administered 2014-01-01 – 2014-01-02 (×2): 2.5 mg via RESPIRATORY_TRACT
  Filled 2014-01-01 (×2): qty 3

## 2014-01-01 MED ORDER — INSULIN GLARGINE 100 UNIT/ML ~~LOC~~ SOLN
10.0000 [IU] | Freq: Every day | SUBCUTANEOUS | Status: DC
  Administered 2014-01-01 – 2014-01-07 (×7): 10 [IU] via SUBCUTANEOUS
  Filled 2014-01-01 (×7): qty 0.1

## 2014-01-01 MED ORDER — ALBUTEROL SULFATE (2.5 MG/3ML) 0.083% IN NEBU
INHALATION_SOLUTION | RESPIRATORY_TRACT | Status: AC
  Filled 2014-01-01: qty 3

## 2014-01-01 MED ORDER — ASPIRIN 325 MG PO TABS
325.0000 mg | ORAL_TABLET | Freq: Every day | ORAL | Status: DC
  Administered 2014-01-02 – 2014-01-08 (×7): 325 mg via ORAL
  Filled 2014-01-01 (×8): qty 1

## 2014-01-01 MED ORDER — GUAIFENESIN 100 MG/5ML PO SOLN
10.0000 mL | Freq: Three times a day (TID) | ORAL | Status: DC
  Administered 2014-01-01: 200 mg via ORAL
  Filled 2014-01-01 (×4): qty 10

## 2014-01-01 MED ORDER — IPRATROPIUM BROMIDE 0.02 % IN SOLN
0.5000 mg | Freq: Once | RESPIRATORY_TRACT | Status: AC
  Administered 2014-01-01: 0.5 mg via RESPIRATORY_TRACT
  Filled 2014-01-01: qty 2.5

## 2014-01-01 MED ORDER — INSULIN ASPART 100 UNIT/ML ~~LOC~~ SOLN: 0.0000 [IU] | Freq: Every day | SUBCUTANEOUS | Status: DC

## 2014-01-01 MED ORDER — HYDROCODONE-ACETAMINOPHEN 5-325 MG PO TABS
1.0000 | ORAL_TABLET | Freq: Four times a day (QID) | ORAL | Status: DC | PRN
  Administered 2014-01-01 – 2014-01-08 (×4): 1 via ORAL
  Filled 2014-01-01 (×5): qty 1

## 2014-01-01 MED ORDER — ALPRAZOLAM 0.5 MG PO TABS: 1.0000 mg | ORAL_TABLET | Freq: Three times a day (TID) | ORAL | Status: DC | PRN

## 2014-01-01 MED ORDER — TIOTROPIUM BROMIDE MONOHYDRATE 18 MCG IN CAPS
18.0000 ug | ORAL_CAPSULE | Freq: Every day | RESPIRATORY_TRACT | Status: DC
  Administered 2014-01-02 – 2014-01-08 (×6): 18 ug via RESPIRATORY_TRACT
  Filled 2014-01-01 (×2): qty 5

## 2014-01-01 MED ORDER — ROSUVASTATIN CALCIUM 5 MG PO TABS
5.0000 mg | ORAL_TABLET | Freq: Every day | ORAL | Status: DC
  Administered 2014-01-02 – 2014-01-09 (×8): 5 mg via ORAL
  Filled 2014-01-01 (×8): qty 1

## 2014-01-01 MED ORDER — FLUTICASONE FUROATE-VILANTEROL 100-25 MCG/INH IN AEPB: 1.0000 | INHALATION_SPRAY | Freq: Every day | RESPIRATORY_TRACT | Status: DC

## 2014-01-01 MED ORDER — ALBUTEROL (5 MG/ML) CONTINUOUS INHALATION SOLN
5.0000 mg/h | INHALATION_SOLUTION | RESPIRATORY_TRACT | Status: DC
  Administered 2014-01-01: 5 mg/h via RESPIRATORY_TRACT
  Filled 2014-01-01: qty 20

## 2014-01-01 MED ORDER — ALBUTEROL SULFATE (2.5 MG/3ML) 0.083% IN NEBU
5.0000 mg | INHALATION_SOLUTION | Freq: Once | RESPIRATORY_TRACT | Status: AC
  Administered 2014-01-01: 5 mg via RESPIRATORY_TRACT
  Filled 2014-01-01 (×2): qty 6

## 2014-01-01 MED ORDER — ALBUTEROL SULFATE (2.5 MG/3ML) 0.083% IN NEBU
2.5000 mg | INHALATION_SOLUTION | Freq: Four times a day (QID) | RESPIRATORY_TRACT | Status: DC
  Administered 2014-01-01: 2.5 mg via RESPIRATORY_TRACT

## 2014-01-01 NOTE — ED Notes (Signed)
Nebulizer stopped.

## 2014-01-01 NOTE — ED Provider Notes (Signed)
CSN: [id number]     Arrival date & time 01/01/14  1140 History   First MD Initiated Contact with Patient 01/01/14 1147     Chief Complaint  Patient presents with  . COPD     (Consider location/radiation/quality/duration/timing/severity/associated sxs/prior Treatment) HPI Comments: Patient is a 68 year old female with history of COPD. She presents with complaints of difficulty breathing since yesterday. She did a home albuterol treatment without much relief. She gets worse today and called EMS. She was given albuterol, Atrovent, and Solu-Medrol in route. She denies any fevers or chills. She denies any productive cough. She denies to me she is experiencing any chest pain.  Patient is a 68 y.o. female presenting with shortness of breath. The history is provided by the patient.  Shortness of Breath Severity:  Moderate Onset quality:  Gradual Duration:  2 days Timing:  Constant Progression:  Worsening Chronicity:  New Context: activity   Relieved by:  Nothing Worsened by:  Nothing tried Ineffective treatments:  None tried Associated symptoms: no chest pain and no fever     Past Medical History  Diagnosis Date  . Hypertension   . Hyperlipidemia   . Chronic back pain   . Osteopenia   . PVD (peripheral vascular disease)   . Tobacco abuse     " I am addicted"   . Vitamin D deficiency disease     03/26/2010 - 9.9ng/mL. Started on replacemnt  . Alcohol abuse, in remission quit 2004  . COPD (chronic obstructive pulmonary disease)     cxr 04/03/2010 - hyperinflation  . Diabetes mellitus     on metaformin  . Neuromuscular disorder     lumbar disc  . Anxiety     Xanax prn  . Shortness of breath     with anxiety  and activty  . Arthritis     BACK , KNEES  . Asthma   . Hyperlipidemia    Past Surgical History  Procedure Laterality Date  . Spinal fusion    . Appendectomy      AGE 48  . Tonsillectomy      AGE 48  . Spinal fusion  2002  . Cholecystectomy      PT NOT SURE    Family History  Problem Relation Age of Onset  . Heart attack Sister 6562    CABG  . Breast cancer Sister   . Cancer Brother   . Heart attack Brother   . Hypertension Other     all siblings  . Anesthesia problems Neg Hx    History  Substance Use Topics  . Smoking status: Current Every Day Smoker -- 0.50 packs/day for 40 years    Types: Cigarettes  . Smokeless tobacco: Never Used  . Alcohol Use: No     Comment: h/o heavy ETOH quit 2004   OB History    No data available     Review of Systems  Constitutional: Negative for fever.  Respiratory: Positive for shortness of breath.   Cardiovascular: Negative for chest pain.  All other systems reviewed and are negative.     Allergies  Brovana and Budesonide  Home Medications   Prior to Admission medications   Medication Sig Start Date End Date Taking? Authorizing Provider  albuterol (PROVENTIL HFA;VENTOLIN HFA) 108 (90 BASE) MCG/ACT inhaler Inhale 2 puffs into the lungs every 4 (four) hours as needed.     Historical Provider, MD  albuterol (PROVENTIL) (2.5 MG/3ML) 0.083% nebulizer solution Take 2.5 mg by nebulization 4 (four) times  daily. (may take 1 every 4 hours as needed for wheezing/shortness of breath) DX: 496 02/14/12   Tammy S Parrett, NP  alprazolam (XANAX) 2 MG tablet Take 1 mg by mouth every 8 (eight) hours as needed.     Historical Provider, MD  aspirin 325 MG tablet Take 325 mg by mouth daily.      Historical Provider, MD  DULoxetine (CYMBALTA) 60 MG capsule Take 1 capsule (60 mg total) by mouth daily. 04/09/12   Jarome Matinaniel Paterson, MD  Fluticasone Furoate-Vilanterol (BREO ELLIPTA) 100-25 MCG/INH AEPB Inhale 1 puff into the lungs daily. 08/01/12   Kalman ShanMurali Ramaswamy, MD  guaiFENesin (ROBITUSSIN) 100 MG/5ML SOLN Take 10 mLs (200 mg total) by mouth 3 (three) times daily. 04/09/12   Jarome Matinaniel Paterson, MD  HYDROcodone-acetaminophen (NORCO/VICODIN) 5-325 MG per tablet Take 1 tablet by mouth every 6 (six) hours as needed for pain.      Historical Provider, MD  metFORMIN (GLUCOPHAGE) 500 MG tablet Take 500 mg by mouth 2 (two) times daily with a meal.      Historical Provider, MD  rosuvastatin (CRESTOR) 5 MG tablet Take 5 mg by mouth daily.      Historical Provider, MD  tiotropium (SPIRIVA) 18 MCG inhalation capsule Place 1 capsule (18 mcg total) into inhaler and inhale daily. 04/09/12   Jarome Matinaniel Paterson, MD   BP 152/54 mmHg  Pulse 97  Temp(Src) 97.9 F (36.6 C) (Oral)  Resp 25  SpO2 100% Physical Exam  Constitutional: She is oriented to person, place, and time. She appears well-developed and well-nourished. No distress.  HENT:  Head: Normocephalic and atraumatic.  Neck: Normal range of motion. Neck supple.  Cardiovascular: Normal rate and regular rhythm.  Exam reveals no gallop and no friction rub.   No murmur heard. Pulmonary/Chest: Effort normal. No respiratory distress. She has wheezes. She has no rales.  Abdominal: Soft. Bowel sounds are normal. She exhibits no distension. There is no tenderness.  Musculoskeletal: Normal range of motion. She exhibits no edema.  Neurological: She is alert and oriented to person, place, and time.  Skin: Skin is warm and dry. She is not diaphoretic.  Nursing note and vitals reviewed.   ED Course  Procedures (including critical care time) Labs Review Labs Reviewed  CBC WITH DIFFERENTIAL - Abnormal; Notable for the following:    WBC 11.2 (*)    Neutrophils Relative % 80 (*)    Neutro Abs 9.0 (*)    All other components within normal limits  COMPREHENSIVE METABOLIC PANEL  TROPONIN I  PRO B NATRIURETIC PEPTIDE    Imaging Review No results found.   EKG Interpretation   Date/Time:  Tuesday January 01 2014 12:32:52 EST Ventricular Rate:  93 PR Interval:  124 QRS Duration: 72 QT Interval:  372 QTC Calculation: 463 R Axis:   179 Text Interpretation:  Sinus rhythm Consider right atrial enlargement  Pattern suggests chronic pulmonary disease Abnormal lateral Q waves   Confirmed by DELOS  MD, Acquanetta Cabanilla ([id number]) on 01/01/2014 12:36:34 PM      MDM   Final diagnoses:  None    Patient presents with complaints of difficulty breathing. Her presentation, exam, and workup is consistent with an exacerbation of COPD. She was given steroids and multiple breathing treatments. I've spoken with [facility] who agrees to admit the patient to his service.  CRITICAL CARE Performed by: Geoffery LyonseLo, Shalice Woodring Total critical care time: 30 minutes Critical care time was exclusive of separately billable procedures and treating other patients.  Critical care was necessary to treat or prevent imminent or life-threatening deterioration. Critical care was time spent personally by me on the following activities: development of treatment plan with patient and/or surrogate as well as nursing, discussions with consultants, evaluation of patient's response to treatment, examination of patient, obtaining history from patient or surrogate, ordering and performing treatments and interventions, ordering and review of laboratory studies, ordering and review of radiographic studies, pulse oximetry and re-evaluation of patient's condition.     Geoffery Lyons, MD 01/01/14 [phone]

## 2014-01-01 NOTE — Plan of Care (Signed)
Problem: Phase I Progression Outcomes Goal: Hemodynamically stable Outcome: Progressing     

## 2014-01-01 NOTE — H&P (Signed)
PCP:   [name], William, MD   Chief Complaint:  SOB  HPI: 68YO BF, severe COPD, current smoker. Presents with worsening respiratory status over the past several days. Exposed to a "cold" from a young family member about a week ago. Had more congestion and SOB.  Still on oral steroids from our ofc. Currently still smoking. Some chills, no measured fever. Chest is sore from coughing. Had loose stools yesterday, none today. No solid food intake today. Overall quite weak, no hemoptysis. Some tremor. Sugars have not been up. No neuropathic pain  Review of Systems:  Review of Systems - as above Past Medical History: Past Medical History  Diagnosis Date  . Hypertension   . Hyperlipidemia   . Chronic back pain   . Osteopenia   . PVD (peripheral vascular disease)   . Tobacco abuse     " I am addicted"   . Vitamin D deficiency disease     03/26/2010 - 9.9ng/mL. Started on replacemnt  . Alcohol abuse, in remission quit 2004  . COPD (chronic obstructive pulmonary disease)     cxr 04/03/2010 - hyperinflation  . Diabetes mellitus     on metaformin  . Neuromuscular disorder     lumbar disc  . Anxiety     Xanax prn  . Shortness of breath     with anxiety  and activty  . Arthritis     BACK , KNEES  . Asthma   . Hyperlipidemia   Past Medical History (reviewed - no changes required): DM2 with vascular, neuro complications  HTN  Hyperlipidemia  Anxiety d/o  Chronic back pain  Osteopenia  COPD, severe  PVD- ABIs 0.86 Left (10/11)  Nicotine addiction  Past Surgical History  Procedure Laterality Date  . Spinal fusion    . Appendectomy      AGE 18  . Tonsillectomy      AGE 18  . Spinal fusion  2002  . Cholecystectomy      PT NOT SURE   Surgical History (reviewed - no changes required): L elbow surgery 2007  L4-L5 fusion 2006  CTS repair 2000 (R), 2006 (L)  T&A  Partial hysterectomy  [facility]  Medications: Prior to Admission medications   Medication Sig Start Date End Date Taking?  Authorizing Provider  albuterol (PROVENTIL HFA;VENTOLIN HFA) 108 (90 BASE) MCG/ACT inhaler Inhale 2 puffs into the lungs every 4 (four) hours as needed.     Historical Provider, MD  albuterol (PROVENTIL) (2.5 MG/3ML) 0.083% nebulizer solution Take 2.5 mg by nebulization 4 (four) times daily. (may take 1 every 4 hours as needed for wheezing/shortness of breath) DX: 496 02/14/12   Tammy S Parrett, NP  alprazolam (XANAX) 2 MG tablet Take 1 mg by mouth every 8 (eight) hours as needed.     Historical Provider, MD  aspirin 325 MG tablet Take 325 mg by mouth daily.      Historical Provider, MD  DULoxetine (CYMBALTA) 60 MG capsule Take 1 capsule (60 mg total) by mouth daily. 04/09/12   Jarome Matinaniel Paterson, MD  Fluticasone Furoate-Vilanterol (BREO ELLIPTA) 100-25 MCG/INH AEPB Inhale 1 puff into the lungs daily. 08/01/12   Kalman ShanMurali Ramaswamy, MD  guaiFENesin (ROBITUSSIN) 100 MG/5ML SOLN Take 10 mLs (200 mg total) by mouth 3 (three) times daily. 04/09/12   Jarome Matinaniel Paterson, MD  HYDROcodone-acetaminophen (NORCO/VICODIN) 5-325 MG per tablet Take 1 tablet by mouth every 6 (six) hours as needed for pain.     Historical Provider, MD  metFORMIN (GLUCOPHAGE) 500 MG  tablet Take 500 mg by mouth 2 (two) times daily with a meal.      Historical Provider, MD  rosuvastatin (CRESTOR) 5 MG tablet Take 5 mg by mouth daily.      Historical Provider, MD  tiotropium (SPIRIVA) 18 MCG inhalation capsule Place 1 capsule (18 mcg total) into inhaler and inhale daily. 04/09/12   Jarome Matin, MD    Allergies:   Allergies  Allergen Reactions  . Brovana [Arformoterol Tartrate] Shortness Of Breath and Cough    General intolerance  . Budesonide Shortness Of Breath and Cough    General intolerance    Social History:  reports that she has been smoking Cigarettes.  She has a 20 pack-year smoking history. She has never used smokeless tobacco. She reports that she does not drink alcohol. Her drug history is not on file. Widow (husband with Lung  CA in 12/14), 1 adopted daughter, 4 grandchildren.  High school education.  Disabled/retired in 2047from [employer].  Smoker (3/4 ppd x >30 yrs), h/o heavy ETOH (quit 2004).   Family History: Family History  Problem Relation Age of Onset  . Heart attack Sister 40    CABG  . Breast cancer Sister   . Cancer Brother   . Heart attack Brother   . Hypertension Other     all siblings  . Anesthesia problems Neg Hx    Father deceased at 71 (CHF).  Mother deceased at 70 (CHF, DM2).  Sister healthy; Sister w/ colon polyps.  Sister deceased at 38 (breast cancer). 1/2 brother deceased due to lung cancer (smoker).  Physical Exam: Filed Vitals:   01/01/14 1500 01/01/14 1515 01/01/14 1530 01/01/14 1625  BP: 155/67 149/64 127/104 148/61  Pulse: 98 99 104 101  Temp:    99.7 F (37.6 C)  TempSrc:    Oral  Resp: 27 20 23 20   SpO2: 100% 100% 98% 97%   General appearance: frail thin but nontoxic, just finishing a neb tx. Some periorbital edema, oral membranes moist, face symmetric Neck supple, symmetric, no tenderness/mass/nodules Resp: bilat rhonchi and some expiratory wheeze, increased AP diameter, no rubs, no accessory ms in use at the moment Cardio: regular fast, distant GI: soft, non-tender; bowel sounds normal; no masses,  no organomegaly Extremities: no clubbing, no edema. Fungal nails Pulses: reduced pulses  Neurologic: awake, Alert mentating OK, globally weak, bit of a tremor    Labs on Admission:   Recent Labs  01/01/14 1213  NA 137  K 4.4  CL 93*  CO2 35*  GLUCOSE 184*  BUN 10  CREATININE 0.51  CALCIUM 9.1    Recent Labs  01/01/14 1213  AST 17  ALT 12  ALKPHOS 59  BILITOT 0.4  PROT 6.6  ALBUMIN 3.5     Recent Labs  01/01/14 1213  WBC 11.2*  NEUTROABS 9.0*  HGB 12.4  HCT 40.5  MCV 90.0  PLT 174    Recent Labs  01/01/14 1213  TROPONINI <0.30       Radiological Exams on Admission: Dg Chest Port 1 View  01/01/2014   CLINICAL DATA:   Productive cough, shortness of breath.  EXAM: PORTABLE CHEST - 1 VIEW  COMPARISON:  April 05, 2012.  FINDINGS: The heart size and mediastinal contours are within normal limits. Stable bibasilar densities are noted most consistent with scarring. No pneumothorax or pleural effusion is noted. Hyperexpansion the lungs is noted consistent with chronic obstructive pulmonary disease. The visualized skeletal structures are unremarkable.  IMPRESSION: Findings consistent  with chronic obstructive pulmonary disease. No acute cardiopulmonary abnormality seen.   Electronically Signed   By: Roque LiasJames  Green M.D.   On: 01/01/2014 14:42   Orders placed or performed during the hospital encounter of 01/01/14  . ED EKG: 01-Jan-2014 12:32:52   Sinus rhythm Consider right atrial enlargement Pattern suggests chronic pulmonary disease Abnormal lateral Q waves Confirmed by Malva CoganELOS MD, DOUGLAS ([id number]) on 01/01/2014            Results for Earlyne IbaBAKER, Rolande S (MRN [id number]) as of 01/01/2014 17:24  Ref. Range 01/01/2014 14:01  Sample type No range found ARTERIAL  pH, Arterial Latest Range: 7.350-7.450  7.305 (L)  pCO2 arterial Latest Range: 35.0-45.0 mmHg 68.6 (HH)  pO2, Arterial Latest Range: 80.0-100.0 mmHg 61.0 (L)  Bicarbonate Latest Range: 20.0-24.0 mEq/L 34.3 (H)  TCO2 Latest Range: 0-100 mmol/L 36  Acid-Base Excess Latest Range: 0.0-2.0 mmol/L 5.0 (H)  O2 Saturation No range found 88.0  Patient temperature No range found 97.9 F  Collection site No range found RADIAL, ALLEN'S T...    Assessment/Plan Principal Problem:   COPD with acute exacerbation: doing a bit better after the neb tx. CXR does not suggest any infiltrates. Suspect this is more of a pure COPD event. WBC not overly high and BP is fine. Will Rx O2, IV steroids, nebs,Gently hydrate. Some CO2 retention noted but pt seems to be mentating OK Check another ABG in AM No evidence of sepsis.  Active Problems:   Tobacco abuse: on going   Severe chronic obstructive  pulmonary disease: family and pt do not seem to fully appreciate the degree of impairment   HTN (hypertension): BP OK   Hyperlipidemia: on Rx   Diabetes mellitus type 2, noninsulin dependent: will add SS insulin and a bit of basal for the steroids PVD:  ABI were abnl FULL CODE   Nicole Maxwell 01/01/2014, 4:52 PM

## 2014-01-01 NOTE — ED Notes (Signed)
From home vis [facility], COPD worsening since yesterday, home Alb with no relief, resp dis on arrival, 5/0.5 Alb/atrovent pta, 125 mg Solu-medrol pta, 20g left FA,   120/60 100 - neb 108 30

## 2014-01-01 NOTE — ED Notes (Signed)
[place] [address]

## 2014-01-01 NOTE — Progress Notes (Signed)
Patient trasfered from ED to [phone] via stretcher; alert and oriented x 4; no complaints of pain; IV saline locked in LFA; skin intact. Orient patient to room and unit; watch safety video; gave patient care guide; instructed how to use the call bell and  fall risk precautions. Will continue to monitor the patient.

## 2014-01-02 LAB — COMPREHENSIVE METABOLIC PANEL
ALT: 11 U/L (ref 0–35)
AST: 15 U/L (ref 0–37)
Albumin: 3.2 g/dL — ABNORMAL LOW (ref 3.5–5.2)
Alkaline Phosphatase: 53 U/L (ref 39–117)
Anion gap: 12 (ref 5–15)
BUN: 14 mg/dL (ref 6–23)
CALCIUM: 8.9 mg/dL (ref 8.4–10.5)
CO2: 28 meq/L (ref 19–32)
CREATININE: 0.51 mg/dL (ref 0.50–1.10)
Chloride: 96 mEq/L (ref 96–112)
GFR calc Af Amer: >90 mL/min (ref >90)
Glucose, Bld: 142 mg/dL — ABNORMAL HIGH (ref 70–99)
Potassium: 5 mEq/L (ref 3.7–5.3)
SODIUM: 136 meq/L — AB (ref 137–147)
Total Bilirubin: 0.2 mg/dL — ABNORMAL LOW (ref 0.3–1.2)
Total Protein: 6.3 g/dL (ref 6.0–8.3)

## 2014-01-02 LAB — BLOOD GAS, ARTERIAL
ACID-BASE EXCESS: 6.7 mmol/L — AB (ref 0.0–2.0)
BICARBONATE: 32.1 meq/L — AB (ref 20.0–24.0)
Drawn by: 405301
O2 Content: 2 L/min
O2 SAT: 95.8 %
PO2 ART: 78.8 mmHg — AB (ref 80.0–100.0)
Patient temperature: 98.7
TCO2: 34 mmol/L (ref 0–100)
pCO2 arterial: 60.1 mmHg (ref 35.0–45.0)
pH, Arterial: 7.348 — ABNORMAL LOW (ref 7.350–7.450)

## 2014-01-02 LAB — CBC
HCT: 36.5 % (ref 36.0–46.0)
HEMOGLOBIN: 10.9 g/dL — AB (ref 12.0–15.0)
MCH: 26 pg (ref 26.0–34.0)
MCHC: 29.9 g/dL — ABNORMAL LOW (ref 30.0–36.0)
MCV: 87.1 fL (ref 78.0–100.0)
Platelets: 153 10*3/uL (ref 150–400)
RBC: 4.19 MIL/uL (ref 3.87–5.11)
RDW: 14.1 % (ref 11.5–15.5)
WBC: 5.3 10*3/uL (ref 4.0–10.5)

## 2014-01-02 LAB — GLUCOSE, CAPILLARY
GLUCOSE-CAPILLARY: 154 mg/dL — AB (ref 70–99)
GLUCOSE-CAPILLARY: 161 mg/dL — AB (ref 70–99)
GLUCOSE-CAPILLARY: 167 mg/dL — AB (ref 70–99)
Glucose-Capillary: 105 mg/dL — ABNORMAL HIGH (ref 70–99)

## 2014-01-02 MED ORDER — VARENICLINE TARTRATE 0.5 MG PO TABS
0.5000 mg | ORAL_TABLET | Freq: Every day | ORAL | Status: AC
  Administered 2014-01-02 – 2014-01-04 (×3): 0.5 mg via ORAL
  Filled 2014-01-02 (×3): qty 1

## 2014-01-02 MED ORDER — VARENICLINE TARTRATE 1 MG PO TABS
1.0000 mg | ORAL_TABLET | Freq: Two times a day (BID) | ORAL | Status: DC
  Administered 2014-01-09: 1 mg via ORAL
  Filled 2014-01-02 (×2): qty 1

## 2014-01-02 MED ORDER — IPRATROPIUM-ALBUTEROL 0.5-2.5 (3) MG/3ML IN SOLN
3.0000 mL | RESPIRATORY_TRACT | Status: DC
  Administered 2014-01-02 – 2014-01-05 (×18): 3 mL via RESPIRATORY_TRACT
  Filled 2014-01-02 (×19): qty 3

## 2014-01-02 MED ORDER — ALBUTEROL SULFATE (2.5 MG/3ML) 0.083% IN NEBU: 2.5000 mg | INHALATION_SOLUTION | RESPIRATORY_TRACT | Status: DC

## 2014-01-02 MED ORDER — ENSURE COMPLETE PO LIQD
237.0000 mL | Freq: Two times a day (BID) | ORAL | Status: DC
  Administered 2014-01-02 – 2014-01-08 (×13): 237 mL via ORAL

## 2014-01-02 MED ORDER — VARENICLINE TARTRATE 0.5 MG PO TABS
0.5000 mg | ORAL_TABLET | Freq: Two times a day (BID) | ORAL | Status: AC
Start: 2014-01-05 — End: 2014-01-08
  Administered 2014-01-05 – 2014-01-08 (×8): 0.5 mg via ORAL
  Filled 2014-01-02 (×8): qty 1

## 2014-01-02 MED ORDER — IPRATROPIUM BROMIDE 0.02 % IN SOLN: 0.5000 mg | RESPIRATORY_TRACT | Status: DC

## 2014-01-02 MED ORDER — BUDESONIDE-FORMOTEROL FUMARATE 160-4.5 MCG/ACT IN AERO
2.0000 | INHALATION_SPRAY | Freq: Two times a day (BID) | RESPIRATORY_TRACT | Status: DC
  Administered 2014-01-02 – 2014-01-08 (×14): 2 via RESPIRATORY_TRACT
  Filled 2014-01-02: qty 6

## 2014-01-02 MED ORDER — DM-GUAIFENESIN ER 30-600 MG PO TB12
1.0000 | ORAL_TABLET | Freq: Two times a day (BID) | ORAL | Status: DC
  Administered 2014-01-02 – 2014-01-09 (×15): 1 via ORAL
  Filled 2014-01-02 (×17): qty 1

## 2014-01-02 MED ORDER — METHYLPREDNISOLONE SODIUM SUCC 125 MG IJ SOLR
80.0000 mg | Freq: Four times a day (QID) | INTRAMUSCULAR | Status: DC
  Administered 2014-01-02 – 2014-01-06 (×17): 80 mg via INTRAVENOUS
  Filled 2014-01-02 (×5): qty 1.28
  Filled 2014-01-02: qty 2
  Filled 2014-01-02 (×11): qty 1.28

## 2014-01-02 MED ORDER — LEVOFLOXACIN 500 MG PO TABS
500.0000 mg | ORAL_TABLET | Freq: Every day | ORAL | Status: DC
  Administered 2014-01-02 – 2014-01-09 (×8): 500 mg via ORAL
  Filled 2014-01-02 (×8): qty 1

## 2014-01-02 NOTE — Progress Notes (Addendum)
INITIAL NUTRITION ASSESSMENT  DOCUMENTATION CODES Per approved criteria  -Non-severe (moderate) malnutrition in the context of social or environmental circumstances   Pt meets criteria for moderate MALNUTRITION in the context of social and environmental circumstances as evidenced by mild to moderate fat and muscle depletion, 17.4% wt loss x 9 months .  INTERVENTION: Ensure Complete po BID, each supplement provides 350 kcal and 13 grams of protein  NUTRITION DIAGNOSIS: Inadequate oral intake related to loss of appetite due to grief as evidenced by PO: 25%, mild to moderate fat and muscle depletion, 17.4% wt loss x 9 months.   Goal: Pt will meet >90% of estimated nutritional needs  Monitor:  PO/supplement intake, labs, weight changes, I/O's  Reason for Assessment: MST=4  68 y.o. female  Admitting Dx: COPD with acute exacerbation  68YO BF, severe COPD, current smoker. Presents with worsening respiratory status over the past several days. Exposed to a "cold" from a young family member about a week ago. Had more congestion and SOB. Still on oral steroids from our ofc.   ASSESSMENT: Pt admitted with COPD exacerbation. She confirms general decline in the past year, including poor appetite and weight loss that has gotten worse over the past several months. She reveals that the past few months "have been a very depressing time" for her, as her husband passed away during Thanksgiving 2014 and she is still grieving over this life change.  She confirms UBW aground 150#. Documented wt hx reveals progressive wt loss over the past year, including 17.4% wt loss over the past 9 months.  Intake has remained poor. Pt has eaten only very few bites of her lunch tray (meal completion 25%). She reports some mild chewing difficulties, but refuses offer of diet downgrade ("I'm not eating no chopped up food"). She reports that she has tried Ensure at home and is willing to take supplements while in the  hospital. Encouraged good meal completion along with consuming supplements to help promote nutritional adequacy. Also reviewed foods on menu that were available to her that were easier to chew, since she refused mechanically altered diet.  Labs reviewed. Na: 136, Glucose: 142, CBGS: 105-178.   Nutrition Focused Physical Exam:  Subcutaneous Fat:  Orbital Region: WDL Upper Arm Region: moderate depletion Thoracic and Lumbar Region: mild depletion  Muscle:  Temple Region: mild depletion Clavicle Bone Region: moderate depletion Clavicle and Acromion Bone Region: moderate depletion Scapular Bone Region: moderate depletion Dorsal Hand: moderate depletion Patellar Region: moderate depletion Anterior Thigh Region: moderate depletion Posterior Calf Region: moderate depletion  Edema: none present  Height: Ht Readings from Last 1 Encounters:  01/01/14 5\' 5"  (1.651 m)    Weight: Wt Readings from Last 1 Encounters:  01/01/14 118 lb 11.2 oz (53.842 kg)    Ideal Body Weight: 125#  % Ideal Body Weight: 94%  Wt Readings from Last 10 Encounters:  01/01/14 118 lb 11.2 oz (53.842 kg)  08/01/12 137 lb (62.143 kg)  04/06/12 143 lb 8.3 oz (65.1 kg)  03/24/12 143 lb 12.8 oz (65.227 kg)  02/11/12 150 lb (68.04 kg)  01/11/12 149 lb 6.4 oz (67.767 kg)  12/08/11 150 lb 9.6 oz (68.312 kg)  10/14/11 149 lb 9.6 oz (67.858 kg)  10/08/11 149 lb 9.6 oz (67.858 kg)  06/11/11 151 lb 6.4 oz (68.675 kg)    Usual Body Weight: 150#  % Usual Body Weight: 127%  BMI:  Body mass index is 19.75 kg/(m^2). Normal weight range  Estimated Nutritional Needs: Kcal: 1600-1800 Protein:  65-75 grams Fluid: 1.6-1.8 L  Skin: WDL  Diet Order: Diet Carb Modified  EDUCATION NEEDS: -Education needs addressed   Intake/Output Summary (Last 24 hours) at 01/02/14 1338 Last data filed at 01/02/14 0946  Gross per 24 hour  Intake    462 ml  Output    400 ml  Net     62 ml    Last BM: 01/01/14    Labs:   Recent Labs Lab 01/01/14 1213 01/02/14 0515  NA 137 136*  K 4.4 5.0  CL 93* 96  CO2 35* 28  BUN 10 14  CREATININE 0.51 0.51  CALCIUM 9.1 8.9  GLUCOSE 184* 142*    CBG (last 3)   Recent Labs  01/01/14 2209 01/02/14 0745 01/02/14 1204  GLUCAP 178* 154* 105*    Scheduled Meds: . aspirin  325 mg Oral Daily  . budesonide-formoterol  2 puff Inhalation BID  . dextromethorphan-guaiFENesin  1 tablet Oral BID  . DULoxetine  60 mg Oral Daily  . enoxaparin (LOVENOX) injection  40 mg Subcutaneous Q24H  . insulin aspart  0-15 Units Subcutaneous TID WC  . insulin aspart  0-5 Units Subcutaneous QHS  . insulin glargine  10 Units Subcutaneous QHS  . ipratropium-albuterol  3 mL Nebulization Q4H  . levofloxacin  500 mg Oral Daily  . methylPREDNISolone (SOLU-MEDROL) injection  80 mg Intravenous Q6H  . rosuvastatin  5 mg Oral Daily  . tiotropium  18 mcg Inhalation Daily  . varenicline  0.5 mg Oral Daily   Followed by  . [START ON 01/05/2014] varenicline  0.5 mg Oral BID   Followed by  . [START ON 01/09/2014] varenicline  1 mg Oral BID    Continuous Infusions:   Past Medical History  Diagnosis Date  . Hypertension   . Chronic back pain     "all over"  . Osteopenia   . PVD (peripheral vascular disease)   . Tobacco abuse     " I am addicted"   . Vitamin D deficiency disease     03/26/2010 - 9.9ng/mL. Started on replacemnt  . Alcohol abuse, in remission quit 2004  . COPD (chronic obstructive pulmonary disease)     cxr 04/03/2010 - hyperinflation  . Neuromuscular disorder     lumbar disc  . Anxiety     Xanax prn  . Shortness of breath     with anxiety  and activty  . Asthma   . Hyperlipidemia   . On home oxygen therapy     "2L; suppose to be 24/7" (01/01/2014)  . Type II diabetes mellitus   . Arthritis     "my whole body"    Past Surgical History  Procedure Laterality Date  . Lumbar laminectomy/decompression microdiscectomy  2005    L4-5/notes 03/06/2010   . Appendectomy  1960  . Tonsillectomy  1960  . Lumbar fusion  2002  . Cholecystectomy      PT NOT SURE  . Abdominal hysterectomy      "partial"  . Back surgery    . Carpal tunnel release Bilateral     CTS repair 2000 (R), 2006 (L)/notes 01/01/2014  . Cataract extraction w/ intraocular lens  implant, bilateral Bilateral 2015   Thomasena Vandenheuvel A. Mayford KnifeWilliams, RD, LDN Pager: [phone] After hours Pager: [phone]

## 2014-01-02 NOTE — Progress Notes (Signed)
MDI just added by MD.

## 2014-01-02 NOTE — Progress Notes (Addendum)
CRITICAL VALUE ALERT  Critical value received:  PCO2-60.1  Date of notification:  01/02/2014  Time of notification:  0532  Critical value read back:Yes.    Nurse who received alert:  Judy Pimpleheresa Meliton Samad, RN  MD notified (1st page):  [facility]  Time of first page:  0559  MD notified (2nd page):  Time of second page:  Responding MD:  S.[facility]  Time MD responded:  0607  No further orders given. Will continue to monitor pt.

## 2014-01-02 NOTE — Care Management Note (Addendum)
    Page 1 of 2   01/10/2014     8:11:06 AM CARE MANAGEMENT NOTE 01/10/2014  Patient:  Nicole IbaBAKER,[name]   Account Number:  [id number]  Date Initiated:  01/02/2014  Documentation initiated by:  Letha CapeAYLOR,Dajiah Kooi  Subjective/Objective Assessment:   dx copd  admit     Action/Plan:   pt eval- rec hhpt/ot  pt also needs rn /resp care, rolling walker and neb medications.   Anticipated DC Date:  01/09/2014   Anticipated DC Plan:  HOME W HOME HEALTH SERVICES      DC Planning Services  CM consult      [facility]   Choice offered to / List presented to:  C-1 Patient   DME arranged  WALKER - ROLLING  NEBULIZER/MEDS      DME agency  [facility]  [facility]     HH arranged  HH-1 RN  HH-2 PT  HH-3 OT  HH-7 RESPIRATORY THERAPY      HH agency  [facility]   Status of service:  Completed, signed off Medicare Important Message given?  YES (If response is "NO", the following Medicare IM given date fields will be blank) Date Medicare IM given:  01/04/2014 Medicare IM given by:  Letha CapeAYLOR,Hoang Reich Date Additional Medicare IM given:  01/07/2014 Additional Medicare IM given by:  Letha CapeEBORAH Hurman Ketelsen  Discharge Dis[facility]:  [facility]  Per UR Regulation:  Reviewed for med. necessity/level of care/duration of stay  If discussed at Long Length of Stay Meetings, dates discussed:    Comments:  01/09/14 1203 Letha Capeeborah Linna Thebeau RN, BSN [phone] [id number] patient is for dc today, NCM notified [facility] , also notified Jermaine with [facility] for rolling walker.  NCM faxed script for neb solution to [facility]. [facility] will replace patient's neb machine, gave them the address where patient will be going per her daughter, Nicole Maxwell.  [address], [place] [address], phone [phone], also [name]'s cell is [phone].  01/08/14 1446 Letha Capeeborah Makinlee Awwad RN, BSN [phone] patient lives alone, patient states she has a neb machine but needs another one.  NCM gave her list of agencies for hh  and she states she will talk with her daughter and get back with me.  NCM left hh agency  list in patient 's room. NCM spoke with daughter, [facility]. [facility] , Miranda notified, [facility], OT and Resp [facility].  [facility] and she has a home nebulizer machine thru [facility].  Soc will begin 24-48 hrs post dc.

## 2014-01-02 NOTE — Progress Notes (Signed)
Subjective: History reviewed- admitted for worsening dyspnea, wheezing, cough despite recent outpatient antibiotics and steroid taper to 5mg  chronically.  Minimal improvement since admission.  Objective: Vital signs in last 24 hours: Temp:  [97.9 F (36.6 C)-99.7 F (37.6 C)] 98.3 F (36.8 C) (12/09 0550) Pulse Rate:  [79-105] 101 (12/09 0550) Resp:  [18-27] 20 (12/09 0550) BP: (127-167)/(54-110) 138/63 mmHg (12/09 0637) SpO2:  [92 %-100 %] 98 % (12/09 0550) Weight:  [53.842 kg (118 lb 11.2 oz)] 53.842 kg (118 lb 11.2 oz) (12/08 1625) Weight change:  Last BM Date: 01/01/14  CBG (last 3)   Recent Labs  01/01/14 1623 01/01/14 2209  GLUCAP 135* 178*    Intake/Output from previous day: 12/08 0701 - 12/09 0700 In: 222 [P.O.:222] Out: 150 [Urine:150] Intake/Output this shift:    General appearance: chronically ill, tachypneic, pursed lip breathing Eyes: no scleral icterus Throat: oropharynx moist without erythema Resp: bilateral expiratory wheezing throughout Cardio: regular rate and rhythm GI: soft, non-tender; bowel sounds normal; no masses,  no organomegaly Extremities: no clubbing, cyanosis or edema   Lab Results:  Recent Labs  01/01/14 1213 01/02/14 0515  NA 137 136*  K 4.4 5.0  CL 93* 96  CO2 35* 28  GLUCOSE 184* 142*  BUN 10 14  CREATININE 0.51 0.51  CALCIUM 9.1 8.9    Recent Labs  01/01/14 1213 01/02/14 0515  AST 17 15  ALT 12 11  ALKPHOS 59 53  BILITOT 0.4 0.2*  PROT 6.6 6.3  ALBUMIN 3.5 3.2*    Recent Labs  01/01/14 1213 01/02/14 0515  WBC 11.2* 5.3  NEUTROABS 9.0*  --   HGB 12.4 10.9*  HCT 40.5 36.5  MCV 90.0 87.1  PLT 174 153   No results found for: INR, PROTIME  Recent Labs  01/01/14 1213  TROPONINI <0.30   ABG- 7.35/60.1/78.8  Studies/Results: Dg Chest Port 1 View  01/01/2014   CLINICAL DATA:  Productive cough, shortness of breath.  EXAM: PORTABLE CHEST - 1 VIEW  COMPARISON:  April 05, 2012.  FINDINGS: The heart size  and mediastinal contours are within normal limits. Stable bibasilar densities are noted most consistent with scarring. No pneumothorax or pleural effusion is noted. Hyperexpansion the lungs is noted consistent with chronic obstructive pulmonary disease. The visualized skeletal structures are unremarkable.  IMPRESSION: Findings consistent with chronic obstructive pulmonary disease. No acute cardiopulmonary abnormality seen.   Electronically Signed   By: Roque LiasJames  Green M.D.   On: 01/01/2014 14:42     Medications: Scheduled: . aspirin  325 mg Oral Daily  . DULoxetine  60 mg Oral Daily  . enoxaparin (LOVENOX) injection  40 mg Subcutaneous Q24H  . Fluticasone Furoate-Vilanterol  1 puff Inhalation Daily  . guaiFENesin  10 mL Oral TID  . insulin aspart  0-15 Units Subcutaneous TID WC  . insulin aspart  0-5 Units Subcutaneous QHS  . insulin glargine  10 Units Subcutaneous QHS  . methylPREDNISolone (SOLU-MEDROL) injection  60 mg Intravenous Q6H  . rosuvastatin  5 mg Oral Daily  . tiotropium  18 mcg Inhalation Daily   Continuous:   Assessment/Plan: Principal Problem: 1. COPD with acute exacerbation- minimal improvement despite IV steroids and bronchodilators.  Will increase Solumedrol dose to 80mg  q6h and change Alb/Atrovent nebs to q4h scheduled.  Add Levaquin due to increased sputum production.  ABG reveals persistent hypercarbia consistent with chronic CO2 retention. Active Problems: 2. Tobacco abuse- emphasized the importance of smoking cessation.  Will start Chantix and provide smoking cessation  resources/education. 3. Hyperlipidemia- continue Crestor. 4. 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  Continue Lantus and SSI. 5. Major Depression- monitor for worsening depression with Chantix.  Continue Cymbalta. 6. Disposition- anticipate discharge in 3-5 days pending response to steroids.  OOB to chair.    LOS: 1 day   Nicole Maxwell, Nicole Maxwell 01/02/2014, 7:25 AM

## 2014-01-03 ENCOUNTER — Inpatient Hospital Stay (HOSPITAL_COMMUNITY): Payer: Medicare Other

## 2014-01-03 DIAGNOSIS — J9620 Acute and chronic respiratory failure, unspecified whether with hypoxia or hypercapnia: Secondary | ICD-10-CM | POA: Diagnosis present

## 2014-01-03 DIAGNOSIS — E440 Moderate protein-calorie malnutrition: Secondary | ICD-10-CM | POA: Diagnosis present

## 2014-01-03 LAB — GLUCOSE, CAPILLARY
Glucose-Capillary: 141 mg/dL — ABNORMAL HIGH (ref 70–99)
Glucose-Capillary: 123 mg/dL — ABNORMAL HIGH (ref 70–99)
Glucose-Capillary: 122 mg/dL — ABNORMAL HIGH (ref 70–99)
Glucose-Capillary: 140 mg/dL — ABNORMAL HIGH (ref 70–99)

## 2014-01-03 LAB — CBC
HEMATOCRIT: 39.7 % (ref 36.0–46.0)
HEMOGLOBIN: 12.4 g/dL (ref 12.0–15.0)
MCH: 26.4 pg (ref 26.0–34.0)
MCHC: 31.2 g/dL (ref 30.0–36.0)
MCV: 84.5 fL (ref 78.0–100.0)
Platelets: 152 10*3/uL (ref 150–400)
RBC: 4.7 MIL/uL (ref 3.87–5.11)
RDW: 13.7 % (ref 11.5–15.5)
WBC: 7.1 10*3/uL (ref 4.0–10.5)

## 2014-01-03 LAB — PRO B NATRIURETIC PEPTIDE: Pro B Natriuretic peptide (BNP): 724.5 pg/mL — ABNORMAL HIGH (ref 0–125)

## 2014-01-03 MED ORDER — ACETYLCYSTEINE 20 % IN SOLN
2.0000 mL | Freq: Three times a day (TID) | RESPIRATORY_TRACT | Status: DC
  Administered 2014-01-03: 15:00:00 via RESPIRATORY_TRACT
  Filled 2014-01-03 (×4): qty 4

## 2014-01-03 MED ORDER — FUROSEMIDE 40 MG PO TABS
40.0000 mg | ORAL_TABLET | Freq: Every day | ORAL | Status: DC
  Administered 2014-01-03 – 2014-01-09 (×7): 40 mg via ORAL
  Filled 2014-01-03 (×7): qty 1

## 2014-01-03 MED ORDER — DIPHENHYDRAMINE HCL 50 MG/ML IJ SOLN
50.0000 mg | Freq: Once | INTRAMUSCULAR | Status: AC
  Administered 2014-01-03: 50 mg via INTRAVENOUS

## 2014-01-03 MED ORDER — GLUCERNA SHAKE PO LIQD
237.0000 mL | Freq: Three times a day (TID) | ORAL | Status: DC
  Administered 2014-01-03 – 2014-01-08 (×16): 237 mL via ORAL
  Filled 2014-01-03 (×3): qty 237

## 2014-01-03 MED ORDER — DIPHENHYDRAMINE HCL 50 MG/ML IJ SOLN
INTRAMUSCULAR | Status: AC
  Filled 2014-01-03: qty 1

## 2014-01-03 NOTE — Plan of Care (Signed)
Problem: Acute Rehab PT Goals(only PT should resolve) Goal: Pt Will Ambulate Outcome: Not Applicable Date Met:  91/50/56 Goal: Pt/caregiver will Perform Home Exercise Program Outcome: Not Applicable Date Met:  97/94/80 Goal: PT Additional Goal #1 Outcome: Not Applicable Date Met:  16/55/37

## 2014-01-03 NOTE — Progress Notes (Signed)
SATURATION QUALIFICATIONS: (This note is used to comply with regulatory documentation for home oxygen)  Patient Saturations on 2L at Rest = 90%  Patient Saturations on Room Air while Ambulating = 83%  Patient Saturations on 2 Liters of oxygen while Ambulating = 92%  Please briefly explain why patient needs home oxygen: without supplemental O2 desaturation Delaney MeigsMaija Tabor Onedia Vargus, PT [phone]

## 2014-01-03 NOTE — Plan of Care (Signed)
Problem: Phase II Progression Outcomes Goal: Pain controlled on oral analgesia Outcome: Completed/Met Date Met:  01/03/14     

## 2014-01-03 NOTE — Progress Notes (Signed)
Pt became very SOB and tachypneic while receiving Mucomyst. and Duo neb. Tx stopped and RN notifiied. MD called. RN administered Xanax and solumedrol and RT talked pt thru deep breathing, increased O2 to 5L.Marland Kitchen. Pt responded to deep breathing and HR and RR dropped to more normal rate. MD stated to DC mucomyst and administer benedryl. O2 decreaased to 3L, sat 96%. Pt feeling better, less distress is  evident now.

## 2014-01-03 NOTE — Progress Notes (Signed)
Subjective: Minimal improvement.  Remains very dyspneic with any exertion.  Nonproductive cough, wheezing persists.  Objective: Vital signs in last 24 hours: Temp:  [98.2 F (36.8 C)-98.8 F (37.1 C)] 98.2 F (36.8 C) (12/10 0612) Pulse Rate:  [91-102] 92 (12/10 0612) Resp:  [18-20] 20 (12/10 0612) BP: (109-153)/(66-82) 149/71 mmHg (12/10 0612) SpO2:  [98 %-100 %] 100 % (12/10 0612) Weight change:  Last BM Date: 01/01/14  CBG (last 3)   Recent Labs  01/02/14 1204 01/02/14 1712 01/02/14 2222  GLUCAP 105* 161* 167*    Intake/Output from previous day: 12/09 0701 - 12/10 0700 In: 462 [P.O.:462] Out: 350 [Urine:350] Intake/Output this shift:    General appearance: tachypneic with movement, audible wheezing Eyes: no scleral icterus Throat: oropharynx moist without erythema Resp: bilateral wheezing throughout unchanged Cardio: regular rate and rhythm GI: soft, non-tender; bowel sounds normal; no masses,  no organomegaly Extremities: no clubbing, cyanosis or edema   Lab Results:  Recent Labs  01/01/14 1213 01/02/14 0515  NA 137 136*  K 4.4 5.0  CL 93* 96  CO2 35* 28  GLUCOSE 184* 142*  BUN 10 14  CREATININE 0.51 0.51  CALCIUM 9.1 8.9    Recent Labs  01/01/14 1213 01/02/14 0515  AST 17 15  ALT 12 11  ALKPHOS 59 53  BILITOT 0.4 0.2*  PROT 6.6 6.3  ALBUMIN 3.5 3.2*    Recent Labs  01/01/14 1213 01/02/14 0515  WBC 11.2* 5.3  NEUTROABS 9.0*  --   HGB 12.4 10.9*  HCT 40.5 36.5  MCV 90.0 87.1  PLT 174 153   No results found for: INR, PROTIME  Recent Labs  01/01/14 1213  TROPONINI <0.30   No results for input(s): TSH, T4TOTAL, T3FREE, THYROIDAB in the last 72 hours.  Invalid input(s): FREET3 No results for input(s): VITAMINB12, FOLATE, FERRITIN, TIBC, IRON, RETICCTPCT in the last 72 hours.  Studies/Results: Dg Chest Port 1 View  01/01/2014   CLINICAL DATA:  Productive cough, shortness of breath.  EXAM: PORTABLE CHEST - 1 VIEW  COMPARISON:   April 05, 2012.  FINDINGS: The heart size and mediastinal contours are within normal limits. Stable bibasilar densities are noted most consistent with scarring. No pneumothorax or pleural effusion is noted. Hyperexpansion the lungs is noted consistent with chronic obstructive pulmonary disease. The visualized skeletal structures are unremarkable.  IMPRESSION: Findings consistent with chronic obstructive pulmonary disease. No acute cardiopulmonary abnormality seen.   Electronically Signed   By: Roque LiasJames  Green M.D.   On: 01/01/2014 14:42     Medications: Scheduled: . aspirin  325 mg Oral Daily  . budesonide-formoterol  2 puff Inhalation BID  . dextromethorphan-guaiFENesin  1 tablet Oral BID  . DULoxetine  60 mg Oral Daily  . enoxaparin (LOVENOX) injection  40 mg Subcutaneous Q24H  . feeding supplement (ENSURE COMPLETE)  237 mL Oral BID BM  . insulin aspart  0-15 Units Subcutaneous TID WC  . insulin aspart  0-5 Units Subcutaneous QHS  . insulin glargine  10 Units Subcutaneous QHS  . ipratropium-albuterol  3 mL Nebulization Q4H  . levofloxacin  500 mg Oral Daily  . methylPREDNISolone (SOLU-MEDROL) injection  80 mg Intravenous Q6H  . rosuvastatin  5 mg Oral Daily  . tiotropium  18 mcg Inhalation Daily  . varenicline  0.5 mg Oral Daily   Followed by  . [START ON 01/05/2014] varenicline  0.5 mg Oral BID   Followed by  . [START ON 01/09/2014] varenicline  1 mg Oral BID  Continuous:   Assessment/Plan: Principal Problem: 1. Acute on chronic respiratory failure due to COPD with acute exacerbation- minimal improvement despite IV steroids and scheduled bronchodilators. Continue Solumedrol dose 80mg  q6h and Alb/Atrovent nebs q4h scheduled. Continue Levaquin due to increased sputum production. ABG reveals persistent hypercarbia consistent with chronic CO2 retention.  Will try Mucomyst nebulizer. Active Problems: 2. Tobacco abuse- Continue Chantix and provide smoking cessation  resources/education. 3. Hyperlipidemia- continue Crestor. 4. 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 Continue Lantus and SSI. 5. Major Depression- monitor for worsening depression with Chantix. Continue Cymbalta. 6. Severe protein calorie malnutrition.  Add Glucerna shakes. 7. Disposition- anticipate discharge in 3-4 days pending response to steroids. OOB to chair.  PT evaluation.  Discussed possibility of ALF but she declines at this time.  She is very high readmission risk- will need home health respiratory care and RN assistance.   LOS: 2 days   [name], Edgard Debord 01/03/2014, 7:23 AM

## 2014-01-03 NOTE — Evaluation (Signed)
Physical Therapy Evaluation Patient Details Name: Nicole Maxwell MRN: [id number] DOB: [birth year] Today's Date: 01/03/2014   History of Present Illness  Pt admitted with COPD exacerbation  Clinical Impression  Patient was very SOB throughout session.  Attempted to ambulate without RW but pt was slightly unsteady and reaching for environmental supports without it, so used RW for remainder of distance.  SpO2 92% on 2L supplemental O2 at rest and with ambulation, dropped to 83% with switch to RA during ambulation.  Educated pt on need for supplemental O2 and energy conservation.  Bilateral lower extremity strength at least 3+/5 on myotomal testing.  Pt would benefit from skilled therapy to address impairments in functional mobility and endurance in order to decrease burden of care and return to PLOF.    Follow Up Recommendations Home health PT    Equipment Recommendations  Rolling walker with 5" wheels    Recommendations for Other Services OT consult     Precautions / Restrictions Precautions Precautions: Fall      Mobility  Bed Mobility Overal bed mobility: Modified Independent             General bed mobility comments: Pt able to move supine-to-sit without physical assist and with increase time/use of bed rails.  Transfers Overall transfer level: Modified independent Equipment used: None             General transfer comment: Pt able to stand without physical assist with no LOB; verbal cues for hand positioning.  Ambulation/Gait Ambulation/Gait assistance: Supervision Ambulation Distance (Feet): 150 Feet Assistive device: Rolling walker (2 wheeled);None Gait Pattern/deviations: Step-through pattern;Decreased stride length;Trunk flexed   Gait velocity interpretation: Below normal speed for age/gender General Gait Details: with walker using accessory muscles and tripod breathing. Cues throughout for safety and breathing technique sats 92% on 2L with gait.  Supervision  for patient safety and comfort.  Stairs            Wheelchair Mobility    Modified Rankin (Stroke Patients Only)       Balance Overall balance assessment: Needs assistance Sitting-balance support: No upper extremity supported;Feet supported Sitting balance-Leahy Scale: Good     Standing balance support: No upper extremity supported;During functional activity Standing balance-Leahy Scale: Good                               Pertinent Vitals/Pain Pain Assessment: 0-10 Pain Score: 4  Pain Location: pain in her chest only with coughing Pain Intervention(s): Repositioned  SpO2 at rest: 92% (2L Stella) SpO2 during ambulation: 92% (2L ) SpO2 during ambulation: 83% (RA)    Home Living Family/patient expects to be discharged to:: Private residence Living Arrangements: Other relatives Available Help at Discharge: Family;Available PRN/intermittently Type of Home: House Home Access: Level entry     Home Layout: One level Home Equipment: Emergency planning/management officerhower seat;Walker - 2 wheels;Cane - single point Additional Comments: lives with granddgtr, all equipment belonged to spouse who was taller than pt    Prior Function Level of Independence: Independent               Hand Dominance        Extremity/Trunk Assessment                         Communication   Communication: No difficulties  Cognition Arousal/Alertness: Awake/alert Behavior During Therapy: WFL for tasks assessed/performed Overall Cognitive Status: Within Functional Limits for tasks  assessed                      General Comments      Exercises        Assessment/Plan    PT Assessment Patient needs continued PT services  PT Diagnosis Difficulty walking   PT Problem List Decreased activity tolerance;Cardiopulmonary status limiting activity  PT Treatment Interventions Gait training;DME instruction;Functional mobility training;Therapeutic activities;Therapeutic  exercise;Patient/family education   PT Goals (Current goals can be found in the Care Plan section) Acute Rehab PT Goals Patient Stated Goal: take care of myself at home PT Goal Formulation: With patient Time For Goal Achievement: 01/17/14 Potential to Achieve Goals: Good    Frequency Min 3X/week   Barriers to discharge Decreased caregiver support      Co-evaluation               End of Session Equipment Utilized During Treatment: Oxygen Activity Tolerance: Patient tolerated treatment well Patient left: in chair;with call bell/phone within reach Nurse Communication: Mobility status         Time:  -      Charges:   PT Evaluation $Initial PT Evaluation Tier I: 1 Procedure PT Treatments $Gait Training: 8-22 mins   PT G Codes:          Sameerah Nachtigal SPT 01/03/2014, 1:11 PM

## 2014-01-04 DIAGNOSIS — I517 Cardiomegaly: Secondary | ICD-10-CM

## 2014-01-04 LAB — BASIC METABOLIC PANEL
ANION GAP: 9 (ref 5–15)
BUN: 21 mg/dL (ref 6–23)
CHLORIDE: 91 meq/L — AB (ref 96–112)
CO2: 34 meq/L — AB (ref 19–32)
CREATININE: 0.56 mg/dL (ref 0.50–1.10)
Calcium: 9 mg/dL (ref 8.4–10.5)
GFR calc Af Amer: >90 mL/min (ref >90)
GFR calc non Af Amer: >90 mL/min (ref >90)
Glucose, Bld: 138 mg/dL — ABNORMAL HIGH (ref 70–99)
Potassium: 4.6 mEq/L (ref 3.7–5.3)
Sodium: 134 mEq/L — ABNORMAL LOW (ref 137–147)

## 2014-01-04 LAB — CBC
HCT: 38.9 % (ref 36.0–46.0)
HEMOGLOBIN: 12.1 g/dL (ref 12.0–15.0)
MCH: 26.1 pg (ref 26.0–34.0)
MCHC: 31.1 g/dL (ref 30.0–36.0)
MCV: 84 fL (ref 78.0–100.0)
PLATELETS: 162 10*3/uL (ref 150–400)
RBC: 4.63 MIL/uL (ref 3.87–5.11)
RDW: 13.8 % (ref 11.5–15.5)
WBC: 6.4 10*3/uL (ref 4.0–10.5)

## 2014-01-04 LAB — GLUCOSE, CAPILLARY
Glucose-Capillary: 138 mg/dL — ABNORMAL HIGH (ref 70–99)
Glucose-Capillary: 259 mg/dL — ABNORMAL HIGH (ref 70–99)
Glucose-Capillary: 125 mg/dL — ABNORMAL HIGH (ref 70–99)
Glucose-Capillary: 119 mg/dL — ABNORMAL HIGH (ref 70–99)

## 2014-01-04 NOTE — Progress Notes (Signed)
Subjective: Reports improvement over past 24 hours though she had significant increased shortness of breath after Mucomyst nebulizer.  Breathing is a little better.  Coughing up thick yellow sputum.  Objective: Vital signs in last 24 hours: Temp:  [98.7 F (37.1 C)-99.3 F (37.4 C)] 99.3 F (37.4 C) (12/11 0516) Pulse Rate:  [87-93] 87 (12/11 0516) Resp:  [20-24] 20 (12/11 0650) BP: (129-158)/(68-75) 129/68 mmHg (12/11 0516) SpO2:  [90 %-100 %] 100 % (12/11 0516) Weight:  [51.256 kg (113 lb)-51.483 kg (113 lb 8 oz)] 51.483 kg (113 lb 8 oz) (12/11 0518) Weight change:  Last BM Date: 01/01/14  CBG (last 3)   Recent Labs  01/03/14 1239 01/03/14 1705 01/03/14 2133  GLUCAP 123* 122* 140*    Intake/Output from previous day: 12/10 0701 - 12/11 0700 In: 582 [P.O.:582] Out: 1950 [Urine:1950] Intake/Output this shift: Total I/O In: 240 [P.O.:240] Out: 1050 [Urine:1050]  General appearance: alert and less tachypneic at rest; audible wheezing and congestion Eyes: no scleral icterus Throat: oropharynx moist without erythema Resp: better air movement; persitent bilateral expiratory wheezing; left basilar crackles Cardio: regular rate and rhythm GI: soft, non-tender; bowel sounds normal; no masses,  no organomegaly Extremities: no clubbing, cyanosis or edema   Lab Results:  Recent Labs  01/01/14 1213 01/02/14 0515  NA 137 136*  K 4.4 5.0  CL 93* 96  CO2 35* 28  GLUCOSE 184* 142*  BUN 10 14  CREATININE 0.51 0.51  CALCIUM 9.1 8.9    Recent Labs  01/01/14 1213 01/02/14 0515  AST 17 15  ALT 12 11  ALKPHOS 59 53  BILITOT 0.4 0.2*  PROT 6.6 6.3  ALBUMIN 3.5 3.2*    Recent Labs  01/01/14 1213 01/02/14 0515 01/03/14 0850  WBC 11.2* 5.3 7.1  NEUTROABS 9.0*  --   --   HGB 12.4 10.9* 12.4  HCT 40.5 36.5 39.7  MCV 90.0 87.1 84.5  PLT 174 153 152     Recent Labs  01/01/14 1213  TROPONINI <0.30   BNP (last 3 results)  Recent Labs  01/01/14 1213  01/03/14 0850  PROBNP 364.2* 724.5*     Studies/Results: Dg Chest 2 View  01/03/2014   CLINICAL DATA:  68 year old female with 2 week history of shortness of breath  EXAM: CHEST  2 VIEW  COMPARISON:  Prior chest x-ray 01/01/2014  FINDINGS: Cardiac and mediastinal contours remain unchanged. Atherosclerotic calcification present within the transverse aorta. Interval development of bilateral small layering pleural effusions with associated bibasilar atelectasis. Pulmonary vascular congestion has slightly increased. No overt interstitial edema. The lungs remain hyperinflated with central bronchitic change suggesting underlying COPD. No acute osseous abnormality.  IMPRESSION: 1. Increased pulmonary vascular congestion and small bilateral layering pleural effusions compared to 01/01/2014. Findings suggest very early CHF. No overt interstitial or alveolar pulmonary edema. 2. Background changes of COPD. 3. Aortic atherosclerosis.   Electronically Signed   By: Malachy MoanHeath  McCullough M.D.   On: 01/03/2014 09:11     Medications: Scheduled: . aspirin  325 mg Oral Daily  . budesonide-formoterol  2 puff Inhalation BID  . dextromethorphan-guaiFENesin  1 tablet Oral BID  . enoxaparin (LOVENOX) injection  40 mg Subcutaneous Q24H  . feeding supplement (ENSURE COMPLETE)  237 mL Oral BID BM  . feeding supplement (GLUCERNA SHAKE)  237 mL Oral TID BM  . furosemide  40 mg Oral Daily  . insulin aspart  0-15 Units Subcutaneous TID WC  . insulin aspart  0-5 Units Subcutaneous QHS  .  insulin glargine  10 Units Subcutaneous QHS  . ipratropium-albuterol  3 mL Nebulization Q4H  . levofloxacin  500 mg Oral Daily  . methylPREDNISolone (SOLU-MEDROL) injection  80 mg Intravenous Q6H  . rosuvastatin  5 mg Oral Daily  . tiotropium  18 mcg Inhalation Daily  . varenicline  0.5 mg Oral Daily   Followed by  . [START ON 01/05/2014] varenicline  0.5 mg Oral BID   Followed by  . [START ON 01/09/2014] varenicline  1 mg Oral BID    Continuous:   Assessment/Plan: Principal Problem: 1. Acute on Chronic Respiratory Failure due to COPD with acute exacerbation- slight improvement in past 24 hours.  Continue IV steroids (transition to po once wheezing improves), Levaquin, scheduled nebulizers in addition to maintenance Spiriva and Symbicort. 2. Congestive Heart Failure with vascular congestion and bilateral pleural effusions on CXR- obtain Echo to evaluate EF.  BNP mildly elevated.  Continue Lasix 40mg  daily- monitor I/Os and weights. 3. Tobacco abuse- Continue Chantix and provide smoking cessation resources/education. 4. Hyperlipidemia- continue Crestor. 5. 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 Continue Lantus and SSI. 6. Major Depression- monitor for worsening depression with Chantix. Continue Cymbalta. 7. Severe protein calorie malnutrition- Add Glucerna shakes.  8. Disposition- anticipate discharge in 3-4 days pending response to treatment. OOB to chair. PT evaluation. Discussed possibility of ALF but she declines at this time. She is very high readmission risk- will need home health respiratory care and RN assistance.   LOS: 3 days   [name], Jennaya Pogue 01/04/2014, 6:57 AM

## 2014-01-04 NOTE — Progress Notes (Signed)
Medicare Important Message given?  YES (If response is "NO", the following Medicare IM given date fields will be blank) Date Medicare IM given:   Medicare IM given by:  Melanie Openshaw 

## 2014-01-04 NOTE — Progress Notes (Signed)
  Echocardiogram 2D Echocardiogram has been performed.  Cathie BeamsGREGORY, Cristal Howatt 01/04/2014, 9:14 AM

## 2014-01-05 LAB — BASIC METABOLIC PANEL
ANION GAP: 12 (ref 5–15)
BUN: 26 mg/dL — ABNORMAL HIGH (ref 6–23)
CO2: 30 mEq/L (ref 19–32)
CREATININE: 0.6 mg/dL (ref 0.50–1.10)
Calcium: 8.9 mg/dL (ref 8.4–10.5)
Chloride: 90 mEq/L — ABNORMAL LOW (ref 96–112)
GFR calc non Af Amer: >90 mL/min (ref >90)
Glucose, Bld: 114 mg/dL — ABNORMAL HIGH (ref 70–99)
Potassium: 4.8 mEq/L (ref 3.7–5.3)
Sodium: 132 mEq/L — ABNORMAL LOW (ref 137–147)

## 2014-01-05 LAB — CBC
HCT: 40 % (ref 36.0–46.0)
Hemoglobin: 12.5 g/dL (ref 12.0–15.0)
MCH: 26.2 pg (ref 26.0–34.0)
MCHC: 31.3 g/dL (ref 30.0–36.0)
MCV: 83.7 fL (ref 78.0–100.0)
Platelets: 144 10*3/uL — ABNORMAL LOW (ref 150–400)
RBC: 4.78 MIL/uL (ref 3.87–5.11)
RDW: 13.9 % (ref 11.5–15.5)
WBC: 7 10*3/uL (ref 4.0–10.5)

## 2014-01-05 LAB — GLUCOSE, CAPILLARY
Glucose-Capillary: 152 mg/dL — ABNORMAL HIGH (ref 70–99)
Glucose-Capillary: 147 mg/dL — ABNORMAL HIGH (ref 70–99)
Glucose-Capillary: 143 mg/dL — ABNORMAL HIGH (ref 70–99)
Glucose-Capillary: 175 mg/dL — ABNORMAL HIGH (ref 70–99)

## 2014-01-05 MED ORDER — IPRATROPIUM-ALBUTEROL 0.5-2.5 (3) MG/3ML IN SOLN
3.0000 mL | Freq: Three times a day (TID) | RESPIRATORY_TRACT | Status: DC
  Administered 2014-01-05 – 2014-01-06 (×3): 3 mL via RESPIRATORY_TRACT
  Filled 2014-01-05 (×3): qty 3

## 2014-01-05 NOTE — Progress Notes (Signed)
Subjective: Still having coughing and dyspnea with ADLs, no chest pain  Objective: Vital signs in last 24 hours: Temp:  [98.2 F (36.8 C)-98.4 F (36.9 C)] 98.2 F (36.8 C) (12/12 0555) Pulse Rate:  [85-88] 85 (12/12 0555) Resp:  [20] 20 (12/12 0555) BP: (140-152)/(65-68) 140/65 mmHg (12/12 0555) SpO2:  [97 %-100 %] 98 % (12/12 0555) Weight:  [52.6 kg (115 lb 15.4 oz)] 52.6 kg (115 lb 15.4 oz) (12/12 0555) Weight change: 1.343 kg (2 lb 15.4 oz)   Intake/Output from previous day: 12/11 0701 - 12/12 0700 In: 960 [P.O.:960] Out: 1350 [Urine:1350]   General appearance: alert, cooperative and no distress Resp: bilateral scattered wheezing and some use of accessory muscles of respiration with conversation Cardio: regular rate and rhythm, S1, S2 normal, no murmur, click, rub or gallop GI: soft, non-tender; bowel sounds normal; no masses,  no organomegaly Extremities: extremities normal, atraumatic, no cyanosis or edema  Lab Results:  Recent Labs  01/04/14 0600 01/05/14 0450  WBC 6.4 7.0  HGB 12.1 12.5  HCT 38.9 40.0  PLT 162 144*   BMET  Recent Labs  01/04/14 0600 01/05/14 0450  NA 134* 132*  K 4.6 4.8  CL 91* 90*  CO2 34* 30  GLUCOSE 138* 114*  BUN 21 26*  CREATININE 0.56 0.60  CALCIUM 9.0 8.9   CMET CMP     Component Value Date/Time   NA 132* 01/05/2014 0450   K 4.8 01/05/2014 0450   CL 90* 01/05/2014 0450   CO2 30 01/05/2014 0450   GLUCOSE 114* 01/05/2014 0450   BUN 26* 01/05/2014 0450   CREATININE 0.60 01/05/2014 0450   CALCIUM 8.9 01/05/2014 0450   PROT 6.3 01/02/2014 0515   ALBUMIN 3.2* 01/02/2014 0515   AST 15 01/02/2014 0515   ALT 11 01/02/2014 0515   ALKPHOS 53 01/02/2014 0515   BILITOT 0.2* 01/02/2014 0515   GFRNONAA >90 01/05/2014 0450   GFRAA >90 01/05/2014 0450    CBG (last 3)   Recent Labs  01/04/14 1657 01/04/14 2151 01/05/14 0832  GLUCAP 125* 119* 152*    INR RESULTS:  No results found for: INR, PROTIME    Studies/Results: No results found.  Medications: I have reviewed the patient's current medications.  Assessment/Plan: #1 Dyspnea: primarily due to COPD exacerbation as echo shows near normal LV Efx and no  Significant valvular heart disease. Will continue nebs, spiriva, and IV steroids and antibiotics. #2 Hyperglycemia: from steroids and stable on SSI  LOS: 4 days   Jamelle Noy G 01/05/2014, 9:35 AM

## 2014-01-06 ENCOUNTER — Inpatient Hospital Stay (HOSPITAL_COMMUNITY): Payer: Medicare Other

## 2014-01-06 LAB — CBC
HCT: 38.5 % (ref 36.0–46.0)
Hemoglobin: 12.3 g/dL (ref 12.0–15.0)
MCH: 26.3 pg (ref 26.0–34.0)
MCHC: 31.9 g/dL (ref 30.0–36.0)
MCV: 82.4 fL (ref 78.0–100.0)
PLATELETS: 142 10*3/uL — AB (ref 150–400)
RBC: 4.67 MIL/uL (ref 3.87–5.11)
RDW: 13.6 % (ref 11.5–15.5)
WBC: 6.3 10*3/uL (ref 4.0–10.5)

## 2014-01-06 LAB — BASIC METABOLIC PANEL
ANION GAP: 12 (ref 5–15)
BUN: 27 mg/dL — ABNORMAL HIGH (ref 6–23)
CHLORIDE: 88 meq/L — AB (ref 96–112)
CO2: 32 mEq/L (ref 19–32)
CREATININE: 0.5 mg/dL (ref 0.50–1.10)
Calcium: 8.3 mg/dL — ABNORMAL LOW (ref 8.4–10.5)
GFR calc non Af Amer: >90 mL/min (ref >90)
Glucose, Bld: 113 mg/dL — ABNORMAL HIGH (ref 70–99)
Potassium: 4.5 mEq/L (ref 3.7–5.3)
SODIUM: 132 meq/L — AB (ref 137–147)

## 2014-01-06 LAB — GLUCOSE, CAPILLARY
GLUCOSE-CAPILLARY: 104 mg/dL — AB (ref 70–99)
Glucose-Capillary: 109 mg/dL — ABNORMAL HIGH (ref 70–99)
Glucose-Capillary: 97 mg/dL (ref 70–99)
Glucose-Capillary: 163 mg/dL — ABNORMAL HIGH (ref 70–99)

## 2014-01-06 LAB — MAGNESIUM: Magnesium: 2.1 mg/dL (ref 1.5–2.5)

## 2014-01-06 MED ORDER — ALBUTEROL SULFATE (2.5 MG/3ML) 0.083% IN NEBU
2.5000 mg | INHALATION_SOLUTION | Freq: Four times a day (QID) | RESPIRATORY_TRACT | Status: DC
  Administered 2014-01-07 – 2014-01-09 (×9): 2.5 mg via RESPIRATORY_TRACT
  Filled 2014-01-06 (×10): qty 3

## 2014-01-06 MED ORDER — ALBUTEROL SULFATE (2.5 MG/3ML) 0.083% IN NEBU
2.5000 mg | INHALATION_SOLUTION | RESPIRATORY_TRACT | Status: DC
  Administered 2014-01-06 (×2): 2.5 mg via RESPIRATORY_TRACT
  Filled 2014-01-06: qty 3

## 2014-01-06 MED ORDER — METHYLPREDNISOLONE SODIUM SUCC 125 MG IJ SOLR
125.0000 mg | Freq: Four times a day (QID) | INTRAMUSCULAR | Status: DC
  Administered 2014-01-06 – 2014-01-08 (×7): 125 mg via INTRAVENOUS
  Filled 2014-01-06 (×9): qty 2

## 2014-01-06 NOTE — Progress Notes (Signed)
Subjective: Having productive cough with thick secretions that are hard to clear, dyspnea with walking in the hallways  Objective: Vital signs in last 24 hours: Temp:  [98.7 F (37.1 C)-98.8 F (37.1 C)] 98.7 F (37.1 C) (12/13 0504) Pulse Rate:  [85-100] 91 (12/13 0505) Resp:  [18-20] 20 (12/13 0504) BP: (135-156)/(66-111) 141/74 mmHg (12/13 0505) SpO2:  [98 %-100 %] 98 % (12/13 1028) Weight:  [54.1 kg (119 lb 4.3 oz)] 54.1 kg (119 lb 4.3 oz) (12/13 0502) Weight change: 1.5 kg (3 lb 4.9 oz)   Intake/Output from previous day: 12/12 0701 - 12/13 0700 In: -  Out: 1200 [Urine:1200]   General appearance: alert, cooperative, mild distress and with frequent dry cough Resp: bilateral mild scattered wheezing without use of accessory muscles of respiration Cardio: regular rate and rhythm, S1, S2 normal, no murmur, click, rub or gallop GI: soft, non-tender; bowel sounds normal; no masses,  no organomegaly Extremities: extremities normal, atraumatic, no cyanosis or edema  Lab Results:  Recent Labs  01/05/14 0450 01/06/14 0505  WBC 7.0 6.3  HGB 12.5 12.3  HCT 40.0 38.5  PLT 144* 142*   BMET  Recent Labs  01/05/14 0450 01/06/14 0505  NA 132* 132*  K 4.8 4.5  CL 90* 88*  CO2 30 32  GLUCOSE 114* 113*  BUN 26* 27*  CREATININE 0.60 0.50  CALCIUM 8.9 8.3*   CMET CMP     Component Value Date/Time   NA 132* 01/06/2014 0505   K 4.5 01/06/2014 0505   CL 88* 01/06/2014 0505   CO2 32 01/06/2014 0505   GLUCOSE 113* 01/06/2014 0505   BUN 27* 01/06/2014 0505   CREATININE 0.50 01/06/2014 0505   CALCIUM 8.3* 01/06/2014 0505   PROT 6.3 01/02/2014 0515   ALBUMIN 3.2* 01/02/2014 0515   AST 15 01/02/2014 0515   ALT 11 01/02/2014 0515   ALKPHOS 53 01/02/2014 0515   BILITOT 0.2* 01/02/2014 0515   GFRNONAA >90 01/06/2014 0505   GFRAA >90 01/06/2014 0505    CBG (last 3)   Recent Labs  01/05/14 1751 01/05/14 2125 01/06/14 0802  GLUCAP 143* 175* 104*    INR RESULTS:  No results found for: INR, PROTIME   Studies/Results: No results found.  Medications: I have reviewed the patient's current medications.  Assessment/Plan: #1 Dyspnea: due to severe COPD exacerbation. Will increase steroids, and change to albuterol nebs q4h, as she is also on Spiriva and secretions are too thick from atrovent. Will check serum magnesium levels and consider giving IV magnesium. Recheck Chest X-ray today. #2 Hyperglycemia: stable on SSI prn  LOS: 5 days   Nicole Maxwell G 01/06/2014, 12:38 PM

## 2014-01-07 DIAGNOSIS — I5032 Chronic diastolic (congestive) heart failure: Secondary | ICD-10-CM | POA: Diagnosis present

## 2014-01-07 LAB — BASIC METABOLIC PANEL
Anion gap: 11 (ref 5–15)
BUN: 26 mg/dL — AB (ref 6–23)
CALCIUM: 8.4 mg/dL (ref 8.4–10.5)
CHLORIDE: 92 meq/L — AB (ref 96–112)
CO2: 32 mEq/L (ref 19–32)
CREATININE: 0.53 mg/dL (ref 0.50–1.10)
GFR calc non Af Amer: >90 mL/min (ref >90)
Glucose, Bld: 116 mg/dL — ABNORMAL HIGH (ref 70–99)
Potassium: 4.6 mEq/L (ref 3.7–5.3)
Sodium: 135 mEq/L — ABNORMAL LOW (ref 137–147)

## 2014-01-07 LAB — CBC
HEMATOCRIT: 40.6 % (ref 36.0–46.0)
Hemoglobin: 13 g/dL (ref 12.0–15.0)
MCH: 26.5 pg (ref 26.0–34.0)
MCHC: 32 g/dL (ref 30.0–36.0)
MCV: 82.9 fL (ref 78.0–100.0)
Platelets: 152 10*3/uL (ref 150–400)
RBC: 4.9 MIL/uL (ref 3.87–5.11)
RDW: 13.6 % (ref 11.5–15.5)
WBC: 6.8 10*3/uL (ref 4.0–10.5)

## 2014-01-07 LAB — GLUCOSE, CAPILLARY
GLUCOSE-CAPILLARY: 105 mg/dL — AB (ref 70–99)
GLUCOSE-CAPILLARY: 141 mg/dL — AB (ref 70–99)
Glucose-Capillary: 122 mg/dL — ABNORMAL HIGH (ref 70–99)
Glucose-Capillary: 125 mg/dL — ABNORMAL HIGH (ref 70–99)

## 2014-01-07 MED ORDER — IRBESARTAN 150 MG PO TABS
150.0000 mg | ORAL_TABLET | Freq: Every day | ORAL | Status: DC
Start: 2014-01-07 — End: 2014-01-09
  Administered 2014-01-07 – 2014-01-09 (×3): 150 mg via ORAL
  Filled 2014-01-07 (×5): qty 1

## 2014-01-07 NOTE — Progress Notes (Signed)
Physical Therapy Treatment Patient Details Name: Nicole Maxwell MRN: [id number] DOB: Nov 20, 1945 Today'Maxwell Date: 01/07/2014    History of Present Illness Pt admitted with COPD exacerbation    PT Comments    Good progress towards physical therapy goals. Ambulating up to 400 feet today with SpO2 maintaining 92-94% on 2L supplemental O2. Tolerated therapeutic exercises well and educated on pursed lip breathing technique. Will continue to follow acutely. Patient will continue to benefit from skilled physical therapy services at home with HHPT to further improve independence with functional mobility.    Follow Up Recommendations  Home health PT     Equipment Recommendations  Rolling walker with 5" wheels (youth)    Recommendations for Other Services OT consult     Precautions / Restrictions Precautions Precautions: Fall Restrictions Weight Bearing Restrictions: No    Mobility  Bed Mobility Overal bed mobility: Modified Independent                Transfers Overall transfer level: Modified independent               General transfer comment: Safely performs  Ambulation/Gait Ambulation/Gait assistance: Supervision Ambulation Distance (Feet): 400 Feet Assistive device: Rolling walker (2 wheeled) Gait Pattern/deviations: Step-through pattern;Decreased stride length;Drifts right/left Gait velocity: decreased   General Gait Details: VC for walker placement within base of support and to relax shoulders while holding RW. Min drifting noted but no loss of balance with use of an assistive device. SpO2 92-94% during bout on 2L supplemental O2   Stairs            Wheelchair Mobility    Modified Rankin (Stroke Patients Only)       Balance                                    Cognition Arousal/Alertness: Awake/alert Behavior During Therapy: WFL for tasks assessed/performed Overall Cognitive Status: Within Functional Limits for tasks assessed                       Exercises General Exercises - Lower Extremity Ankle Circles/Pumps: AROM;Both;10 reps;Seated Long Arc Quad: Strengthening;Both;10 reps;Seated Heel Slides: Strengthening;Both;10 reps;Seated Hip Flexion/Marching: Strengthening;Both;10 reps;Seated Mini-Sqauts: Strengthening;Both;10 reps;Standing    General Comments        Pertinent Vitals/Pain Pain Assessment: 0-10 Pain Score: 7  Pain Location: back Pain Descriptors / Indicators: Aching Pain Intervention(Maxwell): Limited activity within patient'Maxwell tolerance;Monitored during session;Repositioned;Patient requesting pain meds-RN notified   Start of therapy at rest: -SpO2 98% on 2L O2 -HR 94  Ambulating: -SpO2 92-94% on 2L O2 -HR 107-113      Home Living                      Prior Function            PT Goals (current goals can now be found in the care plan section) Acute Rehab PT Goals PT Goal Formulation: With patient Time For Goal Achievement: 01/17/14 Potential to Achieve Goals: Good Progress towards PT goals: Progressing toward goals    Frequency  Min 3X/week    PT Plan Current plan remains appropriate    Co-evaluation             End of Session Equipment Utilized During Treatment: Oxygen Activity Tolerance: Patient tolerated treatment well Patient left: in bed;with call bell/phone within reach;with family/visitor present     Time: 4098-11910845-0910  PT Time Calculation (min) (ACUTE ONLY): 25 min  Charges:  $Gait Training: 8-22 mins $Therapeutic Exercise: 8-22 mins                    G Codes:      Berton MountBarbour, Nicole Maxwell 01/07/2014, 9:22 AM Charlsie MerlesLogan Secor Nicole Maxwell, Nicole Maxwell

## 2014-01-07 NOTE — Progress Notes (Signed)
Medicare Important Message given?  YES (If response is "NO", the following Medicare IM given date fields will be blank) Date Medicare IM given:  01/07/14 Medicare IM given by:  Sneijder Bernards 

## 2014-01-07 NOTE — Progress Notes (Signed)
Subjective: Slow improvement over the weekend.  Persistent cough with thick yellow mucous.  Wheezing is getting better.  Daughter believes she may need some rehab.  Objective: Vital signs in last 24 hours: Temp:  [97.2 F (36.2 C)-98.8 F (37.1 C)] 97.7 F (36.5 C) (12/14 0702) Pulse Rate:  [90-96] 90 (12/14 0702) Resp:  [18-20] 18 (12/14 0702) BP: (147-152)/(64-70) 149/69 mmHg (12/14 0702) SpO2:  [96 %-100 %] 98 % (12/14 0702) Weight:  [52.2 kg (115 lb 1.3 oz)] 52.2 kg (115 lb 1.3 oz) (12/14 0702) Weight change:  Last BM Date: 01/06/14  CBG (last 3)   Recent Labs  01/06/14 1748 01/06/14 2142 01/07/14 0733  GLUCAP 97 163* 122*    Intake/Output from previous day:   Intake/Output this shift: Total I/O In: 240 [P.O.:240] Out: -   General appearance: alert and mild tachypnea, pursed lip breathing Eyes: no scleral icterus Throat: oropharynx moist without erythema Resp: slightly decreased bilateral expiratory wheezing; airflow improved Cardio: regular rate and rhythm GI: soft, non-tender; bowel sounds normal; no masses,  no organomegaly Extremities: no clubbing, cyanosis or edema   Lab Results:  Recent Labs  01/05/14 0450 01/06/14 0505 01/06/14 1300  NA 132* 132*  --   K 4.8 4.5  --   CL 90* 88*  --   CO2 30 32  --   GLUCOSE 114* 113*  --   BUN 26* 27*  --   CREATININE 0.60 0.50  --   CALCIUM 8.9 8.3*  --   MG  --   --  2.1   No results for input(s): AST, ALT, ALKPHOS, BILITOT, PROT, ALBUMIN in the last 72 hours.  Recent Labs  01/06/14 0505 01/07/14 0610  WBC 6.3 6.8  HGB 12.3 13.0  HCT 38.5 40.6  MCV 82.4 82.9  PLT 142* 152    Studies/Results: Dg Chest 2 View  01/06/2014   CLINICAL DATA:  Shortness of breath and cough initial evaluation, smoker, hypertension history  EXAM: CHEST  2 VIEW  COMPARISON:  01/03/2014  FINDINGS: Hyperinflation consistent with COPD. Cardiac silhouette upper normal. Vascular pattern normal. No infiltrate or effusion.  Calcification of the thoracic aorta stable.  IMPRESSION: COPD with no acute findings   Electronically Signed   By: Esperanza Heiraymond  Rubner M.D.   On: 01/06/2014 19:47   Echocardiogram (12/11)- Study Conclusions  - Left ventricle: Hypokinesis mid inferolateral segment. Severe hypokinesis base/mid inferior segments. The cavity size was normal. Wall thickness was normal. The estimated ejection fraction was 50%. Doppler parameters are consistent with abnormal left ventricular relaxation (grade 1 diastolic dysfunction). - Left atrium: The atrium was mildly dilated. - Right ventricle: The cavity size was normal. Systolic function was normal.  Medications: Scheduled: . albuterol  2.5 mg Nebulization QID  . aspirin  325 mg Oral Daily  . budesonide-formoterol  2 puff Inhalation BID  . dextromethorphan-guaiFENesin  1 tablet Oral BID  . enoxaparin (LOVENOX) injection  40 mg Subcutaneous Q24H  . feeding supplement (ENSURE COMPLETE)  237 mL Oral BID BM  . feeding supplement (GLUCERNA SHAKE)  237 mL Oral TID BM  . furosemide  40 mg Oral Daily  . insulin aspart  0-15 Units Subcutaneous TID WC  . insulin aspart  0-5 Units Subcutaneous QHS  . insulin glargine  10 Units Subcutaneous QHS  . levofloxacin  500 mg Oral Daily  . methylPREDNISolone (SOLU-MEDROL) injection  125 mg Intravenous Q6H  . rosuvastatin  5 mg Oral Daily  . tiotropium  18 mcg Inhalation Daily  .  varenicline  0.5 mg Oral BID   Followed by  . [START ON 01/09/2014] varenicline  1 mg Oral BID   Continuous:   Assessment/Plan: Principal Problem: 1. Acute on chronic respiratory failure due to severe COPD with acute exacerbation- slow improvement.  Continue IV Solumedrol (consider transition to oral Prednisone tomorrow), Spiriva, Symbicort, and scheduled Albuterol nebs.   2. Chronic diastolic congestive heart failure- EF 50%- continue Lasix to limit vascular congestion.  Add ARB.  Avoid BBlocker due to #1. 3. Tobacco abuse-  reemphasized importance of smoking cessation.  Continue Chantix. 4. HTN (hypertension)- add ARB. 5. Hyperlipidemia- continue Crestor. 6. Diabetes mellitus type 2, noninsulin dependent- continue Lantus, SSI.  Will transition back to Metformin as steroid dose tapers 7. Malnutrition of moderate degree- continue Glucerna 8. Disposition- slow improvement.  PT recommends Home Health but daughter questions Rehab.  Will look into options but suspect she can go home in 2-3 days on oral steroid taper.  Will need home health RT, PT, nebulizer, O2 (already has).    LOS: 6 days   [name], William 01/07/2014, 7:54 AM

## 2014-01-08 LAB — BASIC METABOLIC PANEL
Anion gap: 11 (ref 5–15)
BUN: 28 mg/dL — ABNORMAL HIGH (ref 6–23)
CHLORIDE: 92 meq/L — AB (ref 96–112)
CO2: 33 meq/L — AB (ref 19–32)
Calcium: 8.8 mg/dL (ref 8.4–10.5)
Creatinine, Ser: 0.62 mg/dL (ref 0.50–1.10)
GFR calc non Af Amer: 90 mL/min — ABNORMAL LOW (ref >90)
Glucose, Bld: 113 mg/dL — ABNORMAL HIGH (ref 70–99)
Potassium: 5 mEq/L (ref 3.7–5.3)
SODIUM: 136 meq/L — AB (ref 137–147)

## 2014-01-08 LAB — GLUCOSE, CAPILLARY
GLUCOSE-CAPILLARY: 100 mg/dL — AB (ref 70–99)
GLUCOSE-CAPILLARY: 120 mg/dL — AB (ref 70–99)
Glucose-Capillary: 100 mg/dL — ABNORMAL HIGH (ref 70–99)
Glucose-Capillary: 88 mg/dL (ref 70–99)

## 2014-01-08 MED ORDER — SODIUM POLYSTYRENE SULFONATE 15 GM/60ML PO SUSP: 30.0000 g | Freq: Four times a day (QID) | ORAL | Status: DC | PRN

## 2014-01-08 MED ORDER — ONDANSETRON HCL 4 MG/2ML IJ SOLN
4.0000 mg | Freq: Four times a day (QID) | INTRAMUSCULAR | Status: DC | PRN
  Administered 2014-01-08: 4 mg via INTRAVENOUS
  Filled 2014-01-08: qty 2

## 2014-01-08 MED ORDER — METFORMIN HCL 500 MG PO TABS
500.0000 mg | ORAL_TABLET | Freq: Two times a day (BID) | ORAL | Status: DC
  Administered 2014-01-08 – 2014-01-09 (×3): 500 mg via ORAL
  Filled 2014-01-08 (×5): qty 1

## 2014-01-08 MED ORDER — BISMUTH SUBSALICYLATE 262 MG/15ML PO SUSP
30.0000 mL | ORAL | Status: DC | PRN
  Administered 2014-01-08: 30 mL via ORAL
  Filled 2014-01-08 (×2): qty 236

## 2014-01-08 MED ORDER — PREDNISONE 50 MG PO TABS
60.0000 mg | ORAL_TABLET | Freq: Every day | ORAL | Status: DC
  Administered 2014-01-08 – 2014-01-09 (×2): 60 mg via ORAL
  Filled 2014-01-08 (×3): qty 1

## 2014-01-08 NOTE — Progress Notes (Signed)
Subjective: Feeling better.  Wheezing, sputum production and SOB are improving.  Ambulating with walker independently.  Objective: Vital signs in last 24 hours: Temp:  [98.1 F (36.7 C)-98.8 F (37.1 C)] 98.1 F (36.7 C) (12/15 0617) Pulse Rate:  [88-89] 88 (12/15 0617) Resp:  [16-20] 17 (12/15 0617) BP: (130-157)/(55-72) 136/69 mmHg (12/15 0617) SpO2:  [98 %-100 %] 100 % (12/15 0617) Weight:  [51.71 kg (114 lb)] 51.71 kg (114 lb) (12/15 0617) Weight change:  Last BM Date: 01/07/14  CBG (last 3)   Recent Labs  01/07/14 1130 01/07/14 1656 01/07/14 2132  GLUCAP 105* 141* 125*    Intake/Output from previous day: 12/14 0701 - 12/15 0700 In: 525 [P.O.:525] Out: 1825 [Urine:1825] Intake/Output this shift:    General appearance: alert and less tachypnea Eyes: no scleral icterus Throat: oropharynx moist without erythema Resp: bilateral expiratory wheezing improved; improved air movement Cardio: regular rate and rhythm GI: soft, non-tender; bowel sounds normal; no masses,  no organomegaly Extremities: no clubbing, cyanosis or edema   Lab Results:  Recent Labs  01/06/14 0505 01/06/14 1300 01/07/14 0610  NA 132*  --  135*  K 4.5  --  4.6  CL 88*  --  92*  CO2 32  --  32  GLUCOSE 113*  --  116*  BUN 27*  --  26*  CREATININE 0.50  --  0.53  CALCIUM 8.3*  --  8.4  MG  --  2.1  --      Recent Labs  01/06/14 0505 01/07/14 0610  WBC 6.3 6.8  HGB 12.3 13.0  HCT 38.5 40.6  MCV 82.4 82.9  PLT 142* 152    Studies/Results: Dg Chest 2 View  01/06/2014   CLINICAL DATA:  Shortness of breath and cough initial evaluation, smoker, hypertension history  EXAM: CHEST  2 VIEW  COMPARISON:  01/03/2014  FINDINGS: Hyperinflation consistent with COPD. Cardiac silhouette upper normal. Vascular pattern normal. No infiltrate or effusion. Calcification of the thoracic aorta stable.  IMPRESSION: COPD with no acute findings   Electronically Signed   By: Esperanza Heiraymond  Rubner M.D.   On:  01/06/2014 19:47     Medications: Scheduled: . albuterol  2.5 mg Nebulization QID  . aspirin  325 mg Oral Daily  . budesonide-formoterol  2 puff Inhalation BID  . dextromethorphan-guaiFENesin  1 tablet Oral BID  . enoxaparin (LOVENOX) injection  40 mg Subcutaneous Q24H  . feeding supplement (ENSURE COMPLETE)  237 mL Oral BID BM  . feeding supplement (GLUCERNA SHAKE)  237 mL Oral TID BM  . furosemide  40 mg Oral Daily  . insulin aspart  0-15 Units Subcutaneous TID WC  . insulin aspart  0-5 Units Subcutaneous QHS  . insulin glargine  10 Units Subcutaneous QHS  . irbesartan  150 mg Oral Daily  . levofloxacin  500 mg Oral Daily  . methylPREDNISolone (SOLU-MEDROL) injection  125 mg Intravenous Q6H  . rosuvastatin  5 mg Oral Daily  . tiotropium  18 mcg Inhalation Daily  . varenicline  0.5 mg Oral BID   Followed by  . [START ON 01/09/2014] varenicline  1 mg Oral BID   Continuous:   Assessment/Plan: Principal Problem: 1. Acute on chronic respiratory failure due to severe COPD with acute exacerbation- slow improvement. Will transition to oral Prednisone today.  Continue Spiriva, Symbicort, and scheduled Albuterol nebs.  2. Chronic diastolic congestive heart failure- EF 50%- continue Lasix to limit vascular congestion and ARB. Avoid BBlocker due to #1.  Check  BMET in am. 3. Tobacco abuse- Doing well on Chantix.  Seems more committed to quitting than ever. 4. HTN (hypertension)- improved with ARB. 5. Hyperlipidemia- continue Crestor. 6. Diabetes mellitus type 2, noninsulin dependent- restart Metformin and discontinue Lantus. 7. Malnutrition of moderate degree- continue Glucerna 8. Disposition- Anticipate discharge tomorrow if continued improvement with oral steroids.  Orders placed for home health RT, PT, nebulizer.  Already has O2.   LOS: 7 days   Nicole Maxwell, Stanely Sexson 01/08/2014, 7:38 AM

## 2014-01-08 NOTE — Progress Notes (Signed)
Physical Therapy Treatment Patient Details Name: Nicole Maxwell MRN: [id number] DOB: 1945/05/28 Today's Date: 01/08/2014    History of Present Illness Pt admitted with COPD exacerbation    PT Comments    Progressing well.  Sats on 2 L [place] maintaining in mid 90's, but EHR for minimal exertion is 110-113 bpm.  Pt sounding congested, but otherwise doing well.   Follow Up Recommendations  Home health PT     Equipment Recommendations  Rolling walker with 5" wheels    Recommendations for Other Services       Precautions / Restrictions Precautions Precautions: Fall Restrictions Weight Bearing Restrictions: No    Mobility  Bed Mobility                  Transfers Overall transfer level: Modified independent               General transfer comment: safe technique  Ambulation/Gait Ambulation/Gait assistance: Modified independent (Device/Increase time) (in home-like environment; impulsivity decreases safety) Ambulation Distance (Feet): 350 Feet Assistive device: Rolling walker (2 wheeled) Gait Pattern/deviations: Step-through pattern Gait velocity: decreased   General Gait Details: pt does alot of quick turns outside the walker and often is outside the RW.  Her irradic gait can appear unsafe, but she is generally steady.  Dued to my safety concerns.   Stairs Stairs: Yes Stairs assistance: Modified independent (Device/Increase time) Stair Management: One rail Right;Alternating pattern;Forwards Number of Stairs: 3 General stair comments: safe with rail  Wheelchair Mobility    Modified Rankin (Stroke Patients Only)       Balance Overall balance assessment: No apparent balance deficits (not formally assessed) Sitting-balance support: No upper extremity supported Sitting balance-Leahy Scale: Good     Standing balance support: No upper extremity supported Standing balance-Leahy Scale: Good                      Cognition Arousal/Alertness:  Awake/alert Behavior During Therapy: WFL for tasks assessed/performed Overall Cognitive Status: Within Functional Limits for tasks assessed                      Exercises General Exercises - Lower Extremity Hip ABduction/ADduction: AROM;Both;10 reps;Standing Hip Flexion/Marching: AROM;Both;10 reps;Standing Toe Raises: AROM;Both;10 reps;Standing Heel Raises: AROM;Both;10 reps;Standing Mini-Sqauts: AROM;Both;10 reps;Standing    General Comments        Pertinent Vitals/Pain Pain Assessment: Faces Faces Pain Scale: Hurts even more Pain Location: back Pain Descriptors / Indicators: Aching    Home Living                      Prior Function            PT Goals (current goals can now be found in the care plan section) Acute Rehab PT Goals Patient Stated Goal: take care of myself at home PT Goal Formulation: With patient Time For Goal Achievement: 01/17/14 Potential to Achieve Goals: Good Progress towards PT goals: Progressing toward goals    Frequency  Min 3X/week    PT Plan Current plan remains appropriate    Co-evaluation             End of Session Equipment Utilized During Treatment: Oxygen Activity Tolerance: Patient tolerated treatment well Patient left: in bed;with call bell/phone within reach;with family/visitor present     Time: 4098-11910951-1022 PT Time Calculation (min) (ACUTE ONLY): 31 min  Charges:  $Gait Training: 8-22 mins $Therapeutic Exercise: 8-22 mins  G Codes:      Hudson Lehmkuhl, Eliseo GumKenneth V 01/08/2014, 10:36 AM 01/08/2014  [place]ory[place] BingKen Britteney Ayotte, PT [phone]  (pager)

## 2014-01-09 DIAGNOSIS — R1013 Epigastric pain: Secondary | ICD-10-CM | POA: Diagnosis not present

## 2014-01-09 LAB — CBC
HEMATOCRIT: 41.8 % (ref 36.0–46.0)
Hemoglobin: 13.2 g/dL (ref 12.0–15.0)
MCH: 26.2 pg (ref 26.0–34.0)
MCHC: 31.6 g/dL (ref 30.0–36.0)
MCV: 82.9 fL (ref 78.0–100.0)
Platelets: 169 10*3/uL (ref 150–400)
RBC: 5.04 MIL/uL (ref 3.87–5.11)
RDW: 13.9 % (ref 11.5–15.5)
WBC: 12.7 10*3/uL — ABNORMAL HIGH (ref 4.0–10.5)

## 2014-01-09 LAB — GLUCOSE, CAPILLARY
Glucose-Capillary: 66 mg/dL — ABNORMAL LOW (ref 70–99)
Glucose-Capillary: 76 mg/dL (ref 70–99)
Glucose-Capillary: 97 mg/dL (ref 70–99)

## 2014-01-09 MED ORDER — ASPIRIN 81 MG PO TBEC: 81.0000 mg | DELAYED_RELEASE_TABLET | Freq: Every day | ORAL | Status: AC

## 2014-01-09 MED ORDER — ALBUTEROL SULFATE (2.5 MG/3ML) 0.083% IN NEBU: 2.5000 mg | INHALATION_SOLUTION | RESPIRATORY_TRACT | Status: DC | PRN

## 2014-01-09 MED ORDER — PREDNISONE 20 MG PO TABS: 20.0000 mg | ORAL_TABLET | Freq: Every day | ORAL | Status: DC

## 2014-01-09 MED ORDER — ONDANSETRON HCL 4 MG/2ML IJ SOLN: 4.0000 mg | Freq: Four times a day (QID) | INTRAMUSCULAR | Status: DC | PRN

## 2014-01-09 MED ORDER — VARENICLINE TARTRATE 1 MG PO TABS: 1.0000 mg | ORAL_TABLET | Freq: Two times a day (BID) | ORAL | Status: DC

## 2014-01-09 MED ORDER — PANTOPRAZOLE SODIUM 40 MG PO TBEC
40.0000 mg | DELAYED_RELEASE_TABLET | Freq: Every day | ORAL | Status: DC
  Administered 2014-01-09: 40 mg via ORAL

## 2014-01-09 MED ORDER — DM-GUAIFENESIN ER 30-600 MG PO TB12: 1.0000 | ORAL_TABLET | Freq: Two times a day (BID) | ORAL | Status: DC

## 2014-01-09 MED ORDER — ASPIRIN EC 81 MG PO TBEC
81.0000 mg | DELAYED_RELEASE_TABLET | Freq: Every day | ORAL | Status: DC
  Administered 2014-01-09: 81 mg via ORAL
  Filled 2014-01-09: qty 1

## 2014-01-09 MED ORDER — GLUCERNA SHAKE PO LIQD: 237.0000 mL | Freq: Three times a day (TID) | ORAL | Status: DC

## 2014-01-09 MED ORDER — IRBESARTAN 150 MG PO TABS: 150.0000 mg | ORAL_TABLET | Freq: Every day | ORAL | Status: DC

## 2014-01-09 MED ORDER — FUROSEMIDE 40 MG PO TABS: 40.0000 mg | ORAL_TABLET | Freq: Every day | ORAL | Status: DC

## 2014-01-09 MED ORDER — PANTOPRAZOLE SODIUM 40 MG PO TBEC: 40.0000 mg | DELAYED_RELEASE_TABLET | Freq: Every day | ORAL | Status: DC

## 2014-01-09 NOTE — Discharge Summary (Signed)
DISCHARGE SUMMARY  Nicole Maxwell  MR#: [id number]  DOB:[birth year]  Date of Admission: 01/01/2014 Date of Discharge: 01/09/2014  Attending Physician:Jawanza Zambito, Nicole Maxwell  Patient's WUJ:Nicole Maxwell, Nicole Noa, MD  Consults:  None  Discharge Diagnoses: Principal Problem:   COPD with acute exacerbation Active Problems:   Tobacco abuse   Severe chronic obstructive pulmonary disease   HTN (hypertension)   Hyperlipidemia   Diabetes mellitus type 2, noninsulin dependent   Malnutrition of moderate degree   Acute on chronic respiratory failure   Chronic diastolic congestive heart failure   Abdominal pain, epigastric  Past Medical History  Diagnosis Date  . Hypertension   . Chronic back pain     "all over"  . Osteopenia   . PVD (peripheral vascular disease)   . Tobacco abuse     " I am addicted"   . Vitamin D deficiency disease     03/26/2010 - 9.9ng/mL. Started on replacemnt  . Alcohol abuse, in remission quit 2004  . COPD (chronic obstructive pulmonary disease)     cxr 04/03/2010 - hyperinflation  . Neuromuscular disorder     lumbar disc  . Anxiety     Xanax prn  . Shortness of breath     with anxiety  and activty  . Asthma   . Hyperlipidemia   . On home oxygen therapy     "2L; suppose to be 24/7" (01/01/2014)  . Type II diabetes mellitus   . Arthritis     "my whole body"    Past Surgical History  Procedure Laterality Date  . Lumbar laminectomy/decompression microdiscectomy  2005    L4-5/notes 03/06/2010  . Appendectomy  1960  . Tonsillectomy  1960  . Lumbar fusion  2002  . Cholecystectomy      PT NOT SURE  . Abdominal hysterectomy      "partial"  . Back surgery    . Carpal tunnel release Bilateral     CTS repair 2000 (R), 2006 (L)/notes 01/01/2014  . Cataract extraction w/ intraocular lens  implant, bilateral Bilateral 2015     Discharge Medications:   Medication List    STOP taking these medications        aspirin 325 MG tablet  Replaced by:  aspirin 81 MG EC tablet      DULoxetine 60 MG capsule  Commonly known as:  CYMBALTA     Fluticasone Furoate-Vilanterol 100-25 MCG/INH Aepb  Commonly known as:  BREO ELLIPTA     guaiFENesin 100 MG/5ML Soln  Commonly known as:  ROBITUSSIN     losartan-hydrochlorothiazide 100-25 MG per tablet  Commonly known as:  HYZAAR      TAKE these medications        acetaminophen 500 MG tablet  Commonly known as:  TYLENOL  Take 500-1,000 mg by mouth every 8 (eight) hours as needed (for pain).     albuterol 108 (90 BASE) MCG/ACT inhaler  Commonly known as:  PROVENTIL HFA;VENTOLIN HFA  Inhale 2 puffs into the lungs every 4 (four) hours as needed for wheezing or shortness of breath.     albuterol (2.5 MG/3ML) 0.083% nebulizer solution  Commonly known as:  PROVENTIL  Take 3 mLs (2.5 mg total) by nebulization every 4 (four) hours as needed for wheezing or shortness of breath. (may take 1 every 4 hours as needed for wheezing/shortness of breath) DX: 496     alprazolam 2 MG tablet  Commonly known as:  XANAX  Take 2 mg by mouth every 8 (eight) hours as needed  for sleep or anxiety.     aspirin 81 MG EC tablet  Take 1 tablet (81 mg total) by mouth daily.     budesonide-formoterol 160-4.5 MCG/ACT inhaler  Commonly known as:  SYMBICORT  Inhale 2 puffs into the lungs 2 (two) times daily.     dextromethorphan-guaiFENesin 30-600 MG per 12 hr tablet  Commonly known as:  MUCINEX DM  Take 1 tablet by mouth 2 (two) times daily.     feeding supplement (GLUCERNA SHAKE) Liqd  Take 237 mLs by mouth 3 (three) times daily between meals.     fluticasone 50 MCG/ACT nasal spray  Commonly known as:  FLONASE  Place 1 spray into both nostrils daily.     furosemide 40 MG tablet  Commonly known as:  LASIX  Take 1 tablet (40 mg total) by mouth daily.     HYDROcodone-acetaminophen 5-325 MG per tablet  Commonly known as:  NORCO/VICODIN  Take 1 tablet by mouth every 6 (six) hours as needed for pain.     irbesartan 150 MG tablet   Commonly known as:  AVAPRO  Take 1 tablet (150 mg total) by mouth daily.     metaxalone 800 MG tablet  Commonly known as:  SKELAXIN  Take 800 mg by mouth daily.     metFORMIN 500 MG tablet  Commonly known as:  GLUCOPHAGE  Take 500 mg by mouth 2 (two) times daily with a meal.     ondansetron 4 MG/2ML Soln injection  Commonly known as:  ZOFRAN  Inject 2 mLs (4 mg total) into the vein every 6 (six) hours as needed for nausea or vomiting (nausea/vomitting).     pantoprazole 40 MG tablet  Commonly known as:  PROTONIX  Take 1 tablet (40 mg total) by mouth daily at 6 (six) AM.     predniSONE 20 MG tablet  Commonly known as:  DELTASONE  Take 1 tablet (20 mg total) by mouth daily with breakfast. Take 3 pills daily for 3 days, then 2 pills daily for 3 days, then 1 pill daily for 3 days, then 1/2 pill daily thereafter.     rosuvastatin 10 MG tablet  Commonly known as:  CRESTOR  Take 10 mg by mouth daily.     tiotropium 18 MCG inhalation capsule  Commonly known as:  SPIRIVA  Place 1 capsule (18 mcg total) into inhaler and inhale daily.     varenicline 1 MG tablet  Commonly known as:  CHANTIX  Take 1 tablet (1 mg total) by mouth 2 (two) times daily.        Hospital Procedures: Dg Chest 2 View  01/06/2014   CLINICAL DATA:  Shortness of breath and cough initial evaluation, smoker, hypertension history  EXAM: CHEST  2 VIEW  COMPARISON:  01/03/2014  FINDINGS: Hyperinflation consistent with COPD. Cardiac silhouette upper normal. Vascular pattern normal. No infiltrate or effusion. Calcification of the thoracic aorta stable.  IMPRESSION: COPD with no acute findings   Electronically Signed   By: Esperanza Heiraymond  Rubner M.D.   On: 01/06/2014 19:47   Dg Chest 2 View  01/03/2014   CLINICAL DATA:  68 year old female with 2 week history of shortness of breath  EXAM: CHEST  2 VIEW  COMPARISON:  Prior chest x-ray 01/01/2014  FINDINGS: Cardiac and mediastinal contours remain unchanged. Atherosclerotic  calcification present within the transverse aorta. Interval development of bilateral small layering pleural effusions with associated bibasilar atelectasis. Pulmonary vascular congestion has slightly increased. No overt interstitial edema. The lungs remain  hyperinflated with central bronchitic change suggesting underlying COPD. No acute osseous abnormality.  IMPRESSION: 1. Increased pulmonary vascular congestion and small bilateral layering pleural effusions compared to 01/01/2014. Findings suggest very early CHF. No overt interstitial or alveolar pulmonary edema. 2. Background changes of COPD. 3. Aortic atherosclerosis.   Electronically Signed   By: Malachy MoanHeath  McCullough M.D.   On: 01/03/2014 09:11   Dg Chest Port 1 View  01/01/2014   CLINICAL DATA:  Productive cough, shortness of breath.  EXAM: PORTABLE CHEST - 1 VIEW  COMPARISON:  April 05, 2012.  FINDINGS: The heart size and mediastinal contours are within normal limits. Stable bibasilar densities are noted most consistent with scarring. No pneumothorax or pleural effusion is noted. Hyperexpansion the lungs is noted consistent with chronic obstructive pulmonary disease. The visualized skeletal structures are unremarkable.  IMPRESSION: Findings consistent with chronic obstructive pulmonary disease. No acute cardiopulmonary abnormality seen.   Electronically Signed   By: Roque LiasJames  Green M.D.   On: 01/01/2014 14:42   Echocardiogram (01/04/14) Study Conclusions - Left ventricle: Hypokinesis mid inferolateral segment. Severe hypokinesis base/mid inferior segments. The cavity size was normal. Wall thickness was normal. The estimated ejection fraction was 50%. Doppler parameters are consistent with abnormal left ventricular relaxation (grade 1 diastolic dysfunction). - Left atrium: The atrium was mildly dilated. - Right ventricle: The cavity size was normal. Systolic function was normal.  History of Present Illness: 68YO BF, severe COPD, current  smoker. Presents with worsening respiratory status over the past several days. Exposed to a "cold" from a young family member about a week ago. Had more congestion and SOB. Still on oral steroids from our ofc. Currently still smoking. Some chills, no measured fever. Chest is sore from coughing. Had loose stools yesterday, none today. No solid food intake today. Overall quite weak, no hemoptysis. Some tremor. Sugars have not been up. No neuropathic pain  Hospital Course: Ms. Excell SeltzerBaker was admitted to a medical bed. She was treated with IV steroids, scheduled nebulizers, continued maintenance Spiriva and Symbicort.  Levaquin was added on hospital day #2 for superimposed bronchitis. Her steroid dose was escalated to 125 mg every 6 hours. Despite these treatments, her condition was slow to improve but ultimately, her wheezing and sputum production declined to the point that we could transition her to oral prednisone on the day prior to discharge. On the morning of discharge she is complaining of some mild epigastric pain and tenderness. She had 3 episodes of diarrhea on the evening prior to discharge. She has not had any further diarrhea this morning. She has completed her Levaquin course may be contributing. I've also started her on Protonix daily for possible gastritis secondary to steroid use. At this point, her condition has improved to the point that she can be discharged home with continued oxygen, maintenance inhaler therapy, and albuterol nebulizers as needed. Arrangements for home health physical therapy, occupational therapy, nursing care, and respiratory therapy have been made. A new nebulizer will be arranged for her at home as well. I've emphasized the importance of smoking cessation. She has been taking Chantix while in the hospital with minimal cravings.  Of note, chest x-ray obtained during this hospitalization did show mild vascular congestion. She was started on Lasix and her ARB was resumed with  Avapro. Echocardiogram showed a preserved ejection fraction and unenhanced consistent with diastolic dysfunction. After addition of the Avapro, her blood pressure was much better. She'll be continued on these medications as an outpatient.  Her metformin was held  on admission and she was placed on Lantus and sliding scale insulin. Prior to discharge, her metformin was resumed and Lantus was discontinued. Initially, her sugars were higher in the setting of steroid induced hyperglycemia but have returned to normal prior to discharge.  At this point, she will be discharged home if she tolerates breakfast without any additional diarrhea or nausea.   Day of Discharge Exam BP 116/44 mmHg  Pulse 80  Temp(Src) 98.5 F (36.9 C) (Oral)  Resp 18  Ht 5\' 5"  (1.651 m)  Wt 51.937 kg (114 lb 8 oz)  BMI 19.05 kg/m2  SpO2 100%  Physical Exam: General appearance: alert and no distress Eyes: no scleral icterus Throat: oropharynx moist without erythema Resp: Mild bilateral expiratory wheezing Cardio: Regular rate and rhythm without murmurs rubs or gallops GI: soft, minimal epigastric tenderness; bowel sounds normal; no masses,  no organomegaly Extremities: no clubbing, cyanosis or edema  Discharge Labs:  Recent Labs  01/06/14 1300 01/07/14 0610 01/08/14 0539  NA  --  135* 136*  K  --  4.6 5.0  CL  --  92* 92*  CO2  --  32 33*  GLUCOSE  --  116* 113*  BUN  --  26* 28*  CREATININE  --  0.53 0.62  CALCIUM  --  8.4 8.8  MG 2.1  --   --      Recent Labs  01/07/14 0610  WBC 6.8  HGB 13.0  HCT 40.6  MCV 82.9  PLT 152    Discharge instructions:     Discharge Instructions    Diet Carb Modified    Complete by:  As directed      Discharge instructions    Complete by:  As directed   Call if you have any difficulty getting your medications. Call if you have increased shortness of breath, wheezing, fever above 101.5, or increased sputum production. Smoking cessation is critical!      Increase activity slowly    Complete by:  As directed            Disposition: To home  Follow-up Appts: Follow-up with Dr. Clelia Croft at [facility] within one week for transition of care visit.  Condition on Discharge: Improved but high risk for readmission and future complications  Tests Needing Follow-up: CBC pending'  Time with discharge activities: 40 minutes  Signed: [name] 01/09/2014, 8:15 AM

## 2014-01-09 NOTE — Progress Notes (Signed)
Patient discharge teaching given, including activity, diet, follow-up appoints, and medications. Patient verbalized understanding of all discharge instructions. IV access was d/c'd. Vitals are stable. Skin is intact except as charted in most recent assessments. Pt to be escorted out by volunteer, to be driven home by family. 

## 2014-01-09 NOTE — Discharge Instructions (Signed)

## 2014-02-21 ENCOUNTER — Inpatient Hospital Stay (HOSPITAL_COMMUNITY)
Admission: AD | Admit: 2014-02-21 | Discharge: 2014-02-27 | DRG: 194 | Disposition: A | Discharge status: Home / Self Care | Payer: Medicare Other | Source: Ambulatory Visit | Attending: Internal Medicine | Admitting: Internal Medicine

## 2014-02-21 ENCOUNTER — Encounter (HOSPITAL_COMMUNITY): Payer: Self-pay | Admitting: Internal Medicine

## 2014-02-21 DIAGNOSIS — Z9049 Acquired absence of other specified parts of digestive tract: Secondary | ICD-10-CM | POA: Diagnosis present

## 2014-02-21 DIAGNOSIS — I739 Peripheral vascular disease, unspecified: Secondary | ICD-10-CM | POA: Diagnosis present

## 2014-02-21 DIAGNOSIS — Z9841 Cataract extraction status, right eye: Secondary | ICD-10-CM

## 2014-02-21 DIAGNOSIS — Z7952 Long term (current) use of systemic steroids: Secondary | ICD-10-CM

## 2014-02-21 DIAGNOSIS — E876 Hypokalemia: Secondary | ICD-10-CM | POA: Diagnosis not present

## 2014-02-21 DIAGNOSIS — I5032 Chronic diastolic (congestive) heart failure: Secondary | ICD-10-CM | POA: Diagnosis present

## 2014-02-21 DIAGNOSIS — Z9981 Dependence on supplemental oxygen: Secondary | ICD-10-CM | POA: Diagnosis not present

## 2014-02-21 DIAGNOSIS — E785 Hyperlipidemia, unspecified: Secondary | ICD-10-CM | POA: Diagnosis present

## 2014-02-21 DIAGNOSIS — Z961 Presence of intraocular lens: Secondary | ICD-10-CM | POA: Diagnosis present

## 2014-02-21 DIAGNOSIS — E1142 Type 2 diabetes mellitus with diabetic polyneuropathy: Secondary | ICD-10-CM | POA: Diagnosis present

## 2014-02-21 DIAGNOSIS — J441 Chronic obstructive pulmonary disease with (acute) exacerbation: Secondary | ICD-10-CM | POA: Diagnosis present

## 2014-02-21 DIAGNOSIS — Z794 Long term (current) use of insulin: Secondary | ICD-10-CM

## 2014-02-21 DIAGNOSIS — M8580 Other specified disorders of bone density and structure, unspecified site: Secondary | ICD-10-CM | POA: Diagnosis present

## 2014-02-21 DIAGNOSIS — K224 Dyskinesia of esophagus: Secondary | ICD-10-CM | POA: Diagnosis present

## 2014-02-21 DIAGNOSIS — E119 Type 2 diabetes mellitus without complications: Secondary | ICD-10-CM

## 2014-02-21 DIAGNOSIS — J449 Chronic obstructive pulmonary disease, unspecified: Secondary | ICD-10-CM | POA: Diagnosis present

## 2014-02-21 DIAGNOSIS — J189 Pneumonia, unspecified organism: Principal | ICD-10-CM | POA: Diagnosis present

## 2014-02-21 DIAGNOSIS — E1151 Type 2 diabetes mellitus with diabetic peripheral angiopathy without gangrene: Secondary | ICD-10-CM | POA: Diagnosis present

## 2014-02-21 DIAGNOSIS — F419 Anxiety disorder, unspecified: Secondary | ICD-10-CM | POA: Diagnosis present

## 2014-02-21 DIAGNOSIS — Z7982 Long term (current) use of aspirin: Secondary | ICD-10-CM

## 2014-02-21 DIAGNOSIS — F17210 Nicotine dependence, cigarettes, uncomplicated: Secondary | ICD-10-CM | POA: Diagnosis present

## 2014-02-21 DIAGNOSIS — Z90710 Acquired absence of both cervix and uterus: Secondary | ICD-10-CM

## 2014-02-21 DIAGNOSIS — G8929 Other chronic pain: Secondary | ICD-10-CM | POA: Diagnosis present

## 2014-02-21 DIAGNOSIS — Z888 Allergy status to other drugs, medicaments and biological substances status: Secondary | ICD-10-CM | POA: Diagnosis not present

## 2014-02-21 DIAGNOSIS — I10 Essential (primary) hypertension: Secondary | ICD-10-CM | POA: Diagnosis present

## 2014-02-21 DIAGNOSIS — Z7951 Long term (current) use of inhaled steroids: Secondary | ICD-10-CM

## 2014-02-21 DIAGNOSIS — Z9842 Cataract extraction status, left eye: Secondary | ICD-10-CM | POA: Diagnosis not present

## 2014-02-21 DIAGNOSIS — R0602 Shortness of breath: Secondary | ICD-10-CM | POA: Diagnosis present

## 2014-02-21 DIAGNOSIS — Y95 Nosocomial condition: Secondary | ICD-10-CM | POA: Diagnosis present

## 2014-02-21 DIAGNOSIS — M1990 Unspecified osteoarthritis, unspecified site: Secondary | ICD-10-CM | POA: Diagnosis present

## 2014-02-21 DIAGNOSIS — J45909 Unspecified asthma, uncomplicated: Secondary | ICD-10-CM | POA: Diagnosis present

## 2014-02-21 DIAGNOSIS — Z981 Arthrodesis status: Secondary | ICD-10-CM | POA: Diagnosis not present

## 2014-02-21 DIAGNOSIS — R1310 Dysphagia, unspecified: Secondary | ICD-10-CM | POA: Diagnosis present

## 2014-02-21 DIAGNOSIS — Z720 Tobacco use: Secondary | ICD-10-CM | POA: Diagnosis present

## 2014-02-21 LAB — COMPREHENSIVE METABOLIC PANEL
ALT: 25 U/L (ref 0–35)
AST: 22 U/L (ref 0–37)
Albumin: 2.9 g/dL — ABNORMAL LOW (ref 3.5–5.2)
Alkaline Phosphatase: 87 U/L (ref 39–117)
Anion gap: 12 (ref 5–15)
BUN: 12 mg/dL (ref 6–23)
CALCIUM: 9 mg/dL (ref 8.4–10.5)
CHLORIDE: 101 mmol/L (ref 96–112)
CO2: 30 mmol/L (ref 19–32)
Creatinine, Ser: 0.74 mg/dL (ref 0.50–1.10)
GFR calc Af Amer: >90 mL/min (ref >90)
GFR, EST NON AFRICAN AMERICAN: 85 mL/min — AB (ref >90)
Glucose, Bld: 181 mg/dL — ABNORMAL HIGH (ref 70–99)
Potassium: 4.4 mmol/L (ref 3.5–5.1)
Sodium: 143 mmol/L (ref 135–145)
TOTAL PROTEIN: 6.6 g/dL (ref 6.0–8.3)
Total Bilirubin: 0.3 mg/dL (ref 0.3–1.2)

## 2014-02-21 LAB — CBC
HCT: 34.8 % — ABNORMAL LOW (ref 36.0–46.0)
Hemoglobin: 10.5 g/dL — ABNORMAL LOW (ref 12.0–15.0)
MCH: 26.1 pg (ref 26.0–34.0)
MCHC: 30.2 g/dL (ref 30.0–36.0)
MCV: 86.6 fL (ref 78.0–100.0)
PLATELETS: 331 10*3/uL (ref 150–400)
RBC: 4.02 MIL/uL (ref 3.87–5.11)
RDW: 15.4 % (ref 11.5–15.5)
WBC: 18.2 10*3/uL — ABNORMAL HIGH (ref 4.0–10.5)

## 2014-02-21 LAB — GLUCOSE, CAPILLARY: Glucose-Capillary: 103 mg/dL — ABNORMAL HIGH (ref 70–99)

## 2014-02-21 MED ORDER — INSULIN ASPART 100 UNIT/ML ~~LOC~~ SOLN
0.0000 [IU] | Freq: Three times a day (TID) | SUBCUTANEOUS | Status: DC
  Administered 2014-02-22 (×2): 2 [IU] via SUBCUTANEOUS
  Administered 2014-02-23: 1 [IU] via SUBCUTANEOUS
  Administered 2014-02-23: 3 [IU] via SUBCUTANEOUS
  Administered 2014-02-24: 2 [IU] via SUBCUTANEOUS
  Administered 2014-02-25: 1 [IU] via SUBCUTANEOUS
  Administered 2014-02-25 – 2014-02-26 (×2): 2 [IU] via SUBCUTANEOUS
  Administered 2014-02-26: 1 [IU] via SUBCUTANEOUS

## 2014-02-21 MED ORDER — ROSUVASTATIN CALCIUM 10 MG PO TABS
10.0000 mg | ORAL_TABLET | Freq: Every day | ORAL | Status: DC
  Administered 2014-02-22 – 2014-02-27 (×6): 10 mg via ORAL
  Filled 2014-02-21 (×6): qty 1

## 2014-02-21 MED ORDER — FUROSEMIDE 40 MG PO TABS
40.0000 mg | ORAL_TABLET | Freq: Every day | ORAL | Status: DC
  Administered 2014-02-22 – 2014-02-27 (×6): 40 mg via ORAL
  Filled 2014-02-21 (×6): qty 1

## 2014-02-21 MED ORDER — VANCOMYCIN HCL 500 MG IV SOLR
500.0000 mg | Freq: Two times a day (BID) | INTRAVENOUS | Status: DC
  Administered 2014-02-22 – 2014-02-23 (×3): 500 mg via INTRAVENOUS
  Filled 2014-02-21 (×5): qty 500

## 2014-02-21 MED ORDER — ACETAMINOPHEN 325 MG PO TABS: 650.0000 mg | ORAL_TABLET | Freq: Four times a day (QID) | ORAL | Status: DC | PRN

## 2014-02-21 MED ORDER — PANTOPRAZOLE SODIUM 40 MG PO TBEC
40.0000 mg | DELAYED_RELEASE_TABLET | Freq: Every day | ORAL | Status: DC
  Administered 2014-02-22 – 2014-02-27 (×5): 40 mg via ORAL
  Filled 2014-02-21 (×5): qty 1

## 2014-02-21 MED ORDER — PIPERACILLIN-TAZOBACTAM 3.375 G IVPB
3.3750 g | Freq: Three times a day (TID) | INTRAVENOUS | Status: DC
  Administered 2014-02-21 – 2014-02-27 (×16): 3.375 g via INTRAVENOUS
  Filled 2014-02-21 (×19): qty 50

## 2014-02-21 MED ORDER — BUDESONIDE-FORMOTEROL FUMARATE 160-4.5 MCG/ACT IN AERO
2.0000 | INHALATION_SPRAY | Freq: Two times a day (BID) | RESPIRATORY_TRACT | Status: DC
  Administered 2014-02-21 – 2014-02-27 (×12): 2 via RESPIRATORY_TRACT
  Filled 2014-02-21: qty 6

## 2014-02-21 MED ORDER — POLYETHYLENE GLYCOL 3350 17 G PO PACK
17.0000 g | PACK | Freq: Every day | ORAL | Status: DC | PRN
  Filled 2014-02-21: qty 1

## 2014-02-21 MED ORDER — VARENICLINE TARTRATE 1 MG PO TABS
1.0000 mg | ORAL_TABLET | Freq: Two times a day (BID) | ORAL | Status: DC
  Administered 2014-02-22 – 2014-02-27 (×11): 1 mg via ORAL
  Filled 2014-02-21 (×14): qty 1

## 2014-02-21 MED ORDER — FLUTICASONE PROPIONATE 50 MCG/ACT NA SUSP
1.0000 | Freq: Every day | NASAL | Status: DC
  Administered 2014-02-22 – 2014-02-27 (×4): 1 via NASAL
  Filled 2014-02-21 (×3): qty 16

## 2014-02-21 MED ORDER — VANCOMYCIN HCL IN DEXTROSE 750-5 MG/150ML-% IV SOLN
750.0000 mg | Freq: Once | INTRAVENOUS | Status: AC
  Administered 2014-02-21: 750 mg via INTRAVENOUS
  Filled 2014-02-21: qty 150

## 2014-02-21 MED ORDER — DM-GUAIFENESIN ER 30-600 MG PO TB12
1.0000 | ORAL_TABLET | Freq: Two times a day (BID) | ORAL | Status: DC
  Administered 2014-02-21 – 2014-02-27 (×12): 1 via ORAL
  Filled 2014-02-21 (×13): qty 1

## 2014-02-21 MED ORDER — IRBESARTAN 150 MG PO TABS
150.0000 mg | ORAL_TABLET | Freq: Every day | ORAL | Status: DC
  Administered 2014-02-22 – 2014-02-27 (×6): 150 mg via ORAL
  Filled 2014-02-21 (×6): qty 1

## 2014-02-21 MED ORDER — TIOTROPIUM BROMIDE MONOHYDRATE 18 MCG IN CAPS
18.0000 ug | ORAL_CAPSULE | Freq: Every day | RESPIRATORY_TRACT | Status: DC
  Administered 2014-02-21 – 2014-02-27 (×6): 18 ug via RESPIRATORY_TRACT
  Filled 2014-02-21 (×2): qty 5

## 2014-02-21 MED ORDER — GLUCERNA SHAKE PO LIQD
237.0000 mL | Freq: Three times a day (TID) | ORAL | Status: DC
  Administered 2014-02-22 – 2014-02-27 (×9): 237 mL via ORAL
  Filled 2014-02-21 (×2): qty 237

## 2014-02-21 MED ORDER — SODIUM CHLORIDE 0.9 % IJ SOLN: 3.0000 mL | INTRAMUSCULAR | Status: DC | PRN

## 2014-02-21 MED ORDER — HYDROCODONE-ACETAMINOPHEN 5-325 MG PO TABS: 1.0000 | ORAL_TABLET | Freq: Four times a day (QID) | ORAL | Status: DC | PRN

## 2014-02-21 MED ORDER — ONDANSETRON HCL 4 MG/2ML IJ SOLN: 4.0000 mg | Freq: Four times a day (QID) | INTRAMUSCULAR | Status: DC | PRN

## 2014-02-21 MED ORDER — METFORMIN HCL 500 MG PO TABS
500.0000 mg | ORAL_TABLET | Freq: Two times a day (BID) | ORAL | Status: DC
  Administered 2014-02-22 – 2014-02-27 (×11): 500 mg via ORAL
  Filled 2014-02-21 (×14): qty 1

## 2014-02-21 MED ORDER — PREDNISONE 50 MG PO TABS
60.0000 mg | ORAL_TABLET | Freq: Every day | ORAL | Status: DC
  Administered 2014-02-22 – 2014-02-24 (×3): 60 mg via ORAL
  Filled 2014-02-21 (×5): qty 1

## 2014-02-21 MED ORDER — ASPIRIN EC 81 MG PO TBEC
81.0000 mg | DELAYED_RELEASE_TABLET | Freq: Every day | ORAL | Status: DC
  Administered 2014-02-22 – 2014-02-27 (×6): 81 mg via ORAL
  Filled 2014-02-21 (×6): qty 1

## 2014-02-21 MED ORDER — ALPRAZOLAM 0.5 MG PO TABS
2.0000 mg | ORAL_TABLET | Freq: Three times a day (TID) | ORAL | Status: DC | PRN
  Administered 2014-02-22 – 2014-02-26 (×5): 2 mg via ORAL
  Filled 2014-02-21 (×5): qty 4

## 2014-02-21 MED ORDER — ACETAMINOPHEN 650 MG RE SUPP: 650.0000 mg | Freq: Four times a day (QID) | RECTAL | Status: DC | PRN

## 2014-02-21 MED ORDER — ENOXAPARIN SODIUM 40 MG/0.4ML ~~LOC~~ SOLN
40.0000 mg | SUBCUTANEOUS | Status: DC
  Administered 2014-02-21 – 2014-02-26 (×6): 40 mg via SUBCUTANEOUS
  Filled 2014-02-21 (×7): qty 0.4

## 2014-02-21 MED ORDER — SODIUM CHLORIDE 0.9 % IJ SOLN
3.0000 mL | Freq: Two times a day (BID) | INTRAMUSCULAR | Status: DC
  Administered 2014-02-22 – 2014-02-25 (×5): 3 mL via INTRAVENOUS

## 2014-02-21 MED ORDER — ALBUTEROL SULFATE (2.5 MG/3ML) 0.083% IN NEBU
2.5000 mg | INHALATION_SOLUTION | Freq: Four times a day (QID) | RESPIRATORY_TRACT | Status: DC
  Administered 2014-02-21 – 2014-02-27 (×23): 2.5 mg via RESPIRATORY_TRACT
  Filled 2014-02-21 (×24): qty 3

## 2014-02-21 MED ORDER — SODIUM CHLORIDE 0.9 % IV SOLN: 250.0000 mL | INTRAVENOUS | Status: DC | PRN

## 2014-02-21 NOTE — H&P (Signed)
PCP:   [name], MD   Chief Complaint:  Shortness of breath, increased sputum production  HPI: Nicole Maxwell is a 69 year old African American female with a history of severe COPD on home oxygen, ongoing tobacco abuse, and diabetes mellitus with vascular and neurologic competitions who presented to the office today with increased  Cough and congestion.  The patient was hospitalized from 12/8 through 12/16 for a COPD exacerbation.  Her symptoms improved with prolonged steroid and antibiotic therapy. Since discharged she has continued on her prednisone 5 mg daily, Cipro Riva, and Symbicort. However, she states that she has continued to have increased congestion.  Over the past several days she has had increased head and chest congestion with increased productive cough with yellow phlegm.  She has had chills but no fever.  She has had intermittent diarrhea.  Due to the symptoms, she called our office and was brought in for urgent evaluation.  Chest x-ray shows new left lower lobe infiltrate.  She'll be admitted for further management.her daughter does note that she has had some cough with eating recently.  Question some dysphagia.  Due to her poor baseline respiratory status and new left lower lobe pneumonia.  She is being admitted for further management.  Review of Systems:  Review of Systems - all systems reviewed and are negative except as in the HPI with the following exceptions: Diarrhea off and on since she left the hospital.  Past Medical History: Past Medical History  Diagnosis Date  . Hypertension   . Chronic back pain     "all over"  . Osteopenia   . PVD (peripheral vascular disease)   . Tobacco abuse     " I am addicted"   . Vitamin D deficiency disease     03/26/2010 - 9.9ng/mL. Started on replacemnt  . Alcohol abuse, in remission quit 2004  . COPD (chronic obstructive pulmonary disease)     cxr 04/03/2010 - hyperinflation  . Neuromuscular disorder     lumbar disc  . Anxiety     Xanax  prn  . Shortness of breath     with anxiety  and activty  . Asthma   . Hyperlipidemia   . On home oxygen therapy     "2L; suppose to be 24/7" (01/01/2014)  . Type II diabetes mellitus   . Arthritis     "my whole body"   DM2 with vascular, neuro complications HTN Hyperlipidemia Anxiety d/o Chronic back pain Osteopenia COPD, severe- on O2 PVD- ABIs 0.86 Left (10/11) Nicotine addiction  Past Surgical History  Procedure Laterality Date  . Lumbar laminectomy/decompression microdiscectomy  2005    L4-5/notes 03/06/2010  . Appendectomy  1960  . Tonsillectomy  1960  . Lumbar fusion  2002  . Cholecystectomy      PT NOT SURE  . Abdominal hysterectomy      "partial"  . Back surgery    . Carpal tunnel release Bilateral     CTS repair 2000 (R), 2006 (L)/notes 01/01/2014  . Cataract extraction w/ intraocular lens  implant, bilateral Bilateral 2015     Medications: Prior to Admission medications   Medication Sig Start Date End Date Taking? Authorizing Provider  acetaminophen (TYLENOL) 500 MG tablet Take 500-1,000 mg by mouth every 8 (eight) hours as needed (for pain).    Historical Provider, MD  albuterol (PROVENTIL HFA;VENTOLIN HFA) 108 (90 BASE) MCG/ACT inhaler Inhale 2 puffs into the lungs every 4 (four) hours as needed for wheezing or shortness of breath.  Historical Provider, MD  albuterol (PROVENTIL) (2.5 MG/3ML) 0.083% nebulizer solution Take 3 mLs (2.5 mg total) by nebulization every 4 (four) hours as needed for wheezing or shortness of breath. (may take 1 every 4 hours as needed for wheezing/shortness of breath) DX: 496 01/09/14   [name], MD  alprazolam Prudy Feeler) 2 MG tablet Take 2 mg by mouth every 8 (eight) hours as needed for sleep or anxiety.     Historical Provider, MD  aspirin EC 81 MG EC tablet Take 1 tablet (81 mg total) by mouth daily. 01/09/14   [name], MD  budesonide-formoterol [facility]) 160-4.5 MCG/ACT inhaler Inhale 2 puffs into the lungs 2 (two)  times daily.    Historical Provider, MD  dextromethorphan-guaiFENesin (MUCINEX DM) 30-600 MG per 12 hr tablet Take 1 tablet by mouth 2 (two) times daily. 01/09/14   [name], MD  feeding supplement, GLUCERNA SHAKE, (GLUCERNA SHAKE) LIQD Take 237 mLs by mouth 3 (three) times daily between meals. 01/09/14   [name], MD  fluticasone (FLONASE) 50 MCG/ACT nasal spray Place 1 spray into both nostrils daily.    Historical Provider, MD  furosemide (LASIX) 40 MG tablet Take 1 tablet (40 mg total) by mouth daily. 01/09/14   [name], MD  HYDROcodone-acetaminophen (NORCO/VICODIN) 5-325 MG per tablet Take 1 tablet by mouth every 6 (six) hours as needed for pain.     Historical Provider, MD  irbesartan (AVAPRO) 150 MG tablet Take 1 tablet (150 mg total) by mouth daily. 01/09/14   [name], MD  metaxalone (SKELAXIN) 800 MG tablet Take 800 mg by mouth daily.    Historical Provider, MD  metFORMIN (GLUCOPHAGE) 500 MG tablet Take 500 mg by mouth 2 (two) times daily with a meal.      Historical Provider, MD  ondansetron (ZOFRAN) 4 MG/2ML SOLN injection Inject 2 mLs (4 mg total) into the vein every 6 (six) hours as needed for nausea or vomiting (nausea/vomitting). 01/09/14   [name], MD  pantoprazole (PROTONIX) 40 MG tablet Take 1 tablet (40 mg total) by mouth daily at 6 (six) AM. 01/09/14   [name], MD  predniSONE (DELTASONE) 20 MG tablet Take 1 tablet (20 mg total) by mouth daily with breakfast. Take 3 pills daily for 3 days, then 2 pills daily for 3 days, then 1 pill daily for 3 days, then 1/2 pill daily thereafter. 01/09/14   [name], MD  rosuvastatin (CRESTOR) 10 MG tablet Take 10 mg by mouth daily.    Historical Provider, MD  tiotropium (SPIRIVA) 18 MCG inhalation capsule Place 1 capsule (18 mcg total) into inhaler and inhale daily. 04/09/12   Jarome Matin, MD  varenicline (CHANTIX) 1 MG tablet Take 1 tablet (1 mg total) by mouth 2 (two) times daily. 01/09/14   [name], MD     Allergies: Allergies  Allergen Reactions  . Brovana [Arformoterol Tartrate] Shortness Of Breath and Cough    General intolerance  . Budesonide Shortness Of Breath and Cough    General intolerance    Social History: Widow (husband with Lung CA in 12/14), 1 adopted daughter, 4 grandchildren. High school education. Disabled/retired in 2017from [employer]. Smoker (3/4 ppd x >30 yrs), h/o heavy ETOH (quit 2004).   Family History: Father deceased at 37 (CHF).  Mother deceased at 26 (CHF, DM2). Sister healthy; Sister w/ colon polyps.   Sister deceased at 33 (breast cancer).  1/2 brother deceased due to lung cancer (smoker).   Physical Exam: Temperature 98.3, pulse 110,  respirations 20, blood pressure 120/60, oxygen saturation 94% on room air, weight 119 pounds General appearance: chronically ill with intermittent deep cough Head: Normocephalic, without obvious abnormality, atraumatic Eyes: conjunctivae/corneas clear. PERRL, EOM's intact.  Nose: Nares normal. Septum midline. Mucosa normal. No drainage or sinus tenderness. Throat: lips, mucosa, and tongue normal; teeth and gums normal Neck: no adenopathy, no carotid bruit, no JVD and thyroid not enlarged, symmetric, no tenderness/mass/nodules Resp: left basilar wheezing and rhonchi; no egophony or dullness to percussion Cardio: regular rate and rhythm GI: soft, non-tender; bowel sounds normal; no masses,  no organomegaly Extremities: extremities normal, atraumatic, no cyanosis or edema Pulses: 2+ and symmetric Lymph nodes: Cervical adenopathy: no cervical lymphadenopathy Neurologic: Alert and oriented X 3, normal strength and tone. Normal symmetric reflexes.     Labs on Admission:  Ordered on admission  Radiological Exams on Admission: Chest x-ray done in the office shows left lower lobe infiltrate   Assessment/Plan Principal Problem: 1. Healthcare-associated pneumonia -we will admit for IV antibiotics and cover  for healthcare associated organisms given onset of symptoms within 6 weeks of discharge from hospital setting.  Will use vancomycin and Zosyn per pharmacy protocol.  Obtain CBC and repeat chest x-ray in 24 hours. Active Problems: 2. Severe chronic obstructive pulmonary disease- will increase prednisone to 60 mg daily and continue Symbicort, Spiriva, and albuterol as needed.  Continue oxygen. 3. HTN (hypertension)-continue home medications. 4. Diabetes mellitus type 2, noninsulin dependent-continue metformin and sliding scale insulin 5. Chronic diastolic congestive heart failure-continue Lasix.  Appears well compensated. 6. Dysphagia- speech therapy consult with MBSS to evaluate for aspiration risk.  Consider outpatient GI evaluation of dysphagia. 7. Tobacco abuse- Have strongly encouraged smoking cessation.  Will use Chantix. 8. Disposition- anticipate dischargerged to home in 3-4 days if improving on IV antibiotics.  [name], Printice Hellmer 02/21/2014, 5:36 PM

## 2014-02-21 NOTE — Progress Notes (Signed)
ANTIBIOTIC CONSULT NOTE - INITIAL  Pharmacy Consult for Vanco/Zosyn Indication: pneumonia  Allergies  Allergen Reactions  . Brovana [Arformoterol Tartrate] Shortness Of Breath and Cough    General intolerance  . Budesonide Shortness Of Breath and Cough    General intolerance    Patient Measurements: Height:  (162.6 cm) Weight: 117 lb 4.6 oz (53.2 kg) IBW/kg (Calculated) : 54.7 Adjusted Body Weight:    Vital Signs: Temp: 98.6 F (37 C) (01/28 1845) Temp Source: Oral (01/28 1845) BP: 125/55 mmHg (01/28 1845) Pulse Rate: 106 (01/28 1845) Intake/Output from previous day:   Intake/Output from this shift:    Labs: No results for input(s): WBC, HGB, PLT, LABCREA, CREATININE in the last 72 hours. CrCl cannot be calculated (Patient has no serum creatinine result on file.). No results for input(s): VANCOTROUGH, VANCOPEAK, VANCORANDOM, GENTTROUGH, GENTPEAK, GENTRANDOM, TOBRATROUGH, TOBRAPEAK, TOBRARND, AMIKACINPEAK, AMIKACINTROU, AMIKACIN in the last 72 hours.   Microbiology: No results found for this or any previous visit (from the past 720 hour(s)).  Medical History: Past Medical History  Diagnosis Date  . Hypertension   . Chronic back pain     "all over"  . Osteopenia   . PVD (peripheral vascular disease)   . Tobacco abuse     " I am addicted"   . Vitamin D deficiency disease     03/26/2010 - 9.9ng/mL. Started on replacemnt  . Alcohol abuse, in remission quit 2004  . COPD (chronic obstructive pulmonary disease)     cxr 04/03/2010 - hyperinflation  . Neuromuscular disorder     lumbar disc  . Anxiety     Xanax prn  . Shortness of breath     with anxiety  and activty  . Asthma   . Hyperlipidemia   . On home oxygen therapy     "2L; suppose to be 24/7" (01/01/2014)  . Type II diabetes mellitus   . Arthritis     "my whole body"    Medications:  Prescriptions prior to admission  Medication Sig Dispense Refill Last Dose  . acetaminophen (TYLENOL) 500 MG tablet  Take 500-1,000 mg by mouth every 8 (eight) hours as needed (for pain).   Past Week at Unknown time  . albuterol (PROVENTIL HFA;VENTOLIN HFA) 108 (90 BASE) MCG/ACT inhaler Inhale 2 puffs into the lungs every 4 (four) hours as needed for wheezing or shortness of breath.    01/01/2014 at Unknown time  . albuterol (PROVENTIL) (2.5 MG/3ML) 0.083% nebulizer solution Take 3 mLs (2.5 mg total) by nebulization every 4 (four) hours as needed for wheezing or shortness of breath. (may take 1 every 4 hours as needed for wheezing/shortness of breath) DX: 496 60 vial 12   . alprazolam (XANAX) 2 MG tablet Take 2 mg by mouth every 8 (eight) hours as needed for sleep or anxiety.    Past Week at Unknown time  . aspirin EC 81 MG EC tablet Take 1 tablet (81 mg total) by mouth daily.     . budesonide-formoterol (SYMBICORT) 160-4.5 MCG/ACT inhaler Inhale 2 puffs into the lungs 2 (two) times daily.   01/01/2014 at Unknown time  . dextromethorphan-guaiFENesin (MUCINEX DM) 30-600 MG per 12 hr tablet Take 1 tablet by mouth 2 (two) times daily.     . feeding supplement, GLUCERNA SHAKE, (GLUCERNA SHAKE) LIQD Take 237 mLs by mouth 3 (three) times daily between meals. 60 Can 3   . fluticasone (FLONASE) 50 MCG/ACT nasal spray Place 1 spray into both nostrils daily.   Past  Week at Unknown time  . furosemide (LASIX) 40 MG tablet Take 1 tablet (40 mg total) by mouth daily. 30 tablet 3   . HYDROcodone-acetaminophen (NORCO/VICODIN) 5-325 MG per tablet Take 1 tablet by mouth every 6 (six) hours as needed for pain.    Past Week at Unknown time  . irbesartan (AVAPRO) 150 MG tablet Take 1 tablet (150 mg total) by mouth daily. 30 tablet 6   . metaxalone (SKELAXIN) 800 MG tablet Take 800 mg by mouth daily.   Past Week at Unknown time  . metFORMIN (GLUCOPHAGE) 500 MG tablet Take 500 mg by mouth 2 (two) times daily with a meal.     Past Week at Unknown time  . ondansetron (ZOFRAN) 4 MG/2ML SOLN injection Inject 2 mLs (4 mg total) into the vein  every 6 (six) hours as needed for nausea or vomiting (nausea/vomitting). 2 mL 0   . pantoprazole (PROTONIX) 40 MG tablet Take 1 tablet (40 mg total) by mouth daily at 6 (six) AM. 30 tablet 3   . predniSONE (DELTASONE) 20 MG tablet Take 1 tablet (20 mg total) by mouth daily with breakfast. Take 3 pills daily for 3 days, then 2 pills daily for 3 days, then 1 pill daily for 3 days, then 1/2 pill daily thereafter. 60 tablet 1   . rosuvastatin (CRESTOR) 10 MG tablet Take 10 mg by mouth daily.   Past Week at Unknown time  . tiotropium (SPIRIVA) 18 MCG inhalation capsule Place 1 capsule (18 mcg total) into inhaler and inhale daily. 30 capsule 1 01/01/2014 at Unknown time  . varenicline (CHANTIX) 1 MG tablet Take 1 tablet (1 mg total) by mouth 2 (two) times daily. 60 tablet 5    Assessment: SOB 69 y/o AAF with severe COPD on home O2, ongoing tobacco abuse and diabetes mellitus with vascular and neurologic complications presents to MD office with increased cough and congestion. Pt recently hospitalized 01/01/14-01/09/17 with COPD exac. CXR with LLL infiltrate.  ID: HCAP. Afebrile. WBC 12.7 elevated. Scr 0.62 (in Dec '15). Estimated CrCl 45.   Goal of Therapy:  Vancomycin trough level 15-20 mcg/ml  Plan:  F/u baseline labs and adjust doses as needed Zosyn 3.375g IV q8hr. Vancomycin 750mg  IV x 1 then 500mg  IV q12h. Vanco trough at steady state.  Dion Parrow S. Merilynn Finlandobertson, PharmD, BCPS Clinical Staff Pharmacist Pager [phone]  Misty Stanleyobertson, Miller Limehouse Stillinger 02/21/2014,6:53 PM

## 2014-02-22 ENCOUNTER — Inpatient Hospital Stay (HOSPITAL_COMMUNITY): Payer: Medicare Other

## 2014-02-22 ENCOUNTER — Encounter (HOSPITAL_COMMUNITY): Payer: Self-pay | Admitting: *Deleted

## 2014-02-22 LAB — BASIC METABOLIC PANEL
ANION GAP: 6 (ref 5–15)
BUN: 13 mg/dL (ref 6–23)
CHLORIDE: 102 mmol/L (ref 96–112)
CO2: 35 mmol/L — ABNORMAL HIGH (ref 19–32)
Calcium: 8.8 mg/dL (ref 8.4–10.5)
Creatinine, Ser: 0.81 mg/dL (ref 0.50–1.10)
GFR calc Af Amer: 84 mL/min — ABNORMAL LOW (ref >90)
GFR calc non Af Amer: 72 mL/min — ABNORMAL LOW (ref >90)
Glucose, Bld: 86 mg/dL (ref 70–99)
Potassium: 4.1 mmol/L (ref 3.5–5.1)
Sodium: 143 mmol/L (ref 135–145)

## 2014-02-22 LAB — GLUCOSE, CAPILLARY
GLUCOSE-CAPILLARY: 159 mg/dL — AB (ref 70–99)
GLUCOSE-CAPILLARY: 196 mg/dL — AB (ref 70–99)
Glucose-Capillary: 83 mg/dL (ref 70–99)

## 2014-02-22 LAB — CBC
HEMATOCRIT: 29.6 % — AB (ref 36.0–46.0)
Hemoglobin: 8.9 g/dL — ABNORMAL LOW (ref 12.0–15.0)
MCH: 26.2 pg (ref 26.0–34.0)
MCHC: 30.1 g/dL (ref 30.0–36.0)
MCV: 87.1 fL (ref 78.0–100.0)
Platelets: 277 10*3/uL (ref 150–400)
RBC: 3.4 MIL/uL — AB (ref 3.87–5.11)
RDW: 15.1 % (ref 11.5–15.5)
WBC: 14.5 10*3/uL — AB (ref 4.0–10.5)

## 2014-02-22 MED ORDER — BENZONATATE 100 MG PO CAPS
100.0000 mg | ORAL_CAPSULE | Freq: Three times a day (TID) | ORAL | Status: DC | PRN
  Administered 2014-02-22 – 2014-02-25 (×4): 100 mg via ORAL
  Filled 2014-02-22 (×6): qty 1

## 2014-02-22 NOTE — Evaluation (Signed)
Clinical/Bedside Swallow Evaluation Patient Details  Name: Nicole Maxwell MRN: [id number] Date of Birth: [birth year]  Today's Date: 02/22/2014 Time: SLP Start Time (ACUTE ONLY): 0822 SLP Stop Time (ACUTE ONLY): 4098 SLP Time Calculation (min) (ACUTE ONLY): 11 min  Past Medical History:  Past Medical History  Diagnosis Date  . Hypertension   . Chronic back pain     "all over"  . Osteopenia   . PVD (peripheral vascular disease)   . Tobacco abuse     " I am addicted"   . Vitamin D deficiency disease     03/26/2010 - 9.9ng/mL. Started on replacemnt  . Alcohol abuse, in remission quit 2004  . COPD (chronic obstructive pulmonary disease)     cxr 04/03/2010 - hyperinflation  . Neuromuscular disorder     lumbar disc  . Anxiety     Xanax prn  . Shortness of breath     with anxiety  and activty  . Asthma   . Hyperlipidemia   . On home oxygen therapy     "2L; suppose to be 24/7" (01/01/2014)  . Type II diabetes mellitus   . Arthritis     "my whole body"   Past Surgical History:  Past Surgical History  Procedure Laterality Date  . Lumbar laminectomy/decompression microdiscectomy  2005    L4-5/notes 03/06/2010  . Appendectomy  1960  . Tonsillectomy  1960  . Lumbar fusion  2002  . Cholecystectomy      PT NOT SURE  . Abdominal hysterectomy      "partial"  . Back surgery    . Carpal tunnel release Bilateral     CTS repair 2000 (R), 2006 (L)/notes 01/01/2014  . Cataract extraction w/ intraocular lens  implant, bilateral Bilateral 2015   HPI:  Nicole Maxwell is a 69 year old African American female with a history of severe COPD on home oxygen, ongoing tobacco abuse, and diabetes mellitus with vascular and neurologic competitions who presents with increased Cough and congestion. The patient was hospitalized from 12/8 through 12/16 for a COPD exacerbation. Her symptoms improved with prolonged steroid and antibiotic therapy. Since discharged she has continued on her prednisone 5 mg daily, Cipro Riva,  and Symbicort. However, she states that she has continued to have increased congestion. Over the past several days she has had increased head and chest congestion with increased productive cough with yellow phlegm. She has had chills but no fever. She has had intermittent diarrhea. Chest x-ray shows new left lower lobe infiltrate.Her daughter does note that she has had some cough with eating recently.    Assessment / Plan / Recommendation Clinical Impression  Pt reports symptoms often associated with esophageal dysphagia (globus, regurgitation of food, expectoration of mucous). Also presents with delayed coughing. Given findings, suspect primary esophageal dysphagia, though oropharyngeal dysphagia also possible. Will complete MBS to objectively evaluate swallow function. Pt in agreement.     Aspiration Risk  Moderate    Diet Recommendation Regular;Thin liquid   Liquid Administration via: Cup Medication Administration: Whole meds with liquid Supervision: Patient able to self feed Postural Changes and/or Swallow Maneuvers: Seated upright 90 degrees;Upright 30-60 min after meal    Other  Recommendations Oral Care Recommendations: Oral care BID   Follow Up Recommendations  None    Frequency and Duration        Pertinent Vitals/Pain NA    SLP Swallow Goals     Swallow Study Prior Functional Status  Type of Home: House Available Help at Discharge: Family;Available PRN/intermittently  General HPI: Nicole MartinSam is a 69 year old African American female with a history of severe COPD on home oxygen, ongoing tobacco abuse, and diabetes mellitus with vascular and neurologic competitions who presents with increased Cough and congestion. The patient was hospitalized from 12/8 through 12/16 for a COPD exacerbation. Her symptoms improved with prolonged steroid and antibiotic therapy. Since discharged she has continued on her prednisone 5 mg daily, Cipro Riva, and Symbicort. However, she states  that she has continued to have increased congestion. Over the past several days she has had increased head and chest congestion with increased productive cough with yellow phlegm. She has had chills but no fever. She has had intermittent diarrhea. Chest x-ray shows new left lower lobe infiltrate.Her daughter does note that she has had some cough with eating recently.  Type of Study: Bedside swallow evaluation Previous Swallow Assessment: none Diet Prior to this Study: Regular;Thin liquids Temperature Spikes Noted: No Respiratory Status: Nasal cannula History of Recent Intubation: No Behavior/Cognition: Alert;Cooperative;Pleasant mood Oral Cavity - Dentition: Adequate natural dentition Self-Feeding Abilities: Able to feed self Patient Positioning: Upright in chair Baseline Vocal Quality: Hoarse Volitional Cough: Strong;Congested Volitional Swallow: Able to elicit    Oral/Motor/Sensory Function Overall Oral Motor/Sensory Function: Appears within functional limits for tasks assessed   Ice Chips     Thin Liquid Thin Liquid: Impaired Presentation: Cup Pharyngeal  Phase Impairments: Cough - Delayed    Nectar Thick Nectar Thick Liquid: Not tested   Honey Thick Honey Thick Liquid: Not tested   Puree Puree: Not tested   Solid   GO    Solid: Impaired Pharyngeal Phase Impairments: Cough - Delayed      Harlon DittyBonnie Kimberley Dastrup, MA CCC-SLP [phone]  Batu Cassin, Riley NearingBonnie Caroline 02/22/2014,8:37 AM

## 2014-02-22 NOTE — Evaluation (Signed)
Physical Therapy Evaluation Patient Details Name: Nicole FISCHBACH MRN: [id number] DOB: 11/04/1945 Today's Date: 02/22/2014   History of Present Illness  Pt admitted with SOB and HCAP after recent admission for COPD exacerbation  Clinical Impression  Pt familiar from prior admission and moving well but demonstrates activity and balance deficits at present. Pt states she had finished working with HHPT but isn't moving as well today as normally. Pt will benefit from acute therapy to maximize mobility, function, activity tolerance and balance to return pt to Independent level.     Follow Up Recommendations Home health PT    Equipment Recommendations  None recommended by PT    Recommendations for Other Services       Precautions / Restrictions Precautions Precautions: Fall Precaution Comments: watch sats      Mobility  Bed Mobility Overal bed mobility: Modified Independent                Transfers Overall transfer level: Modified independent                  Ambulation/Gait Ambulation/Gait assistance: Min guard Ambulation Distance (Feet): 300 Feet Assistive device: None Gait Pattern/deviations: Decreased stride length;Step-through pattern;Drifts right/left   Gait velocity interpretation: Below normal speed for age/gender General Gait Details: Pt with standing rest half way to recover sats as they dropped to 86% on 2L with gait. On 3L 89% with gait and at rest 98% on 3L and 92% on 2L. Pt educated for pursed lip breathing as well as safety with gait given impaired balance this morning  Stairs            Wheelchair Mobility    Modified Rankin (Stroke Patients Only)       Balance Overall balance assessment: Needs assistance   Sitting balance-Leahy Scale: Good       Standing balance-Leahy Scale: Good                               Pertinent Vitals/Pain Pain Assessment: No/denies pain  HR 105    Home Living Family/patient expects to  be discharged to:: Private residence Living Arrangements: Other relatives Available Help at Discharge: Family;Available PRN/intermittently Type of Home: House Home Access: Level entry     Home Layout: One level Home Equipment: Emergency planning/management officer - 2 wheels;Cane - single point Additional Comments: lives with granddgtr, all equipment belonged to spouse who was taller than pt    Prior Function Level of Independence: Independent               Hand Dominance        Extremity/Trunk Assessment   Upper Extremity Assessment: Overall WFL for tasks assessed           Lower Extremity Assessment: Overall WFL for tasks assessed      Cervical / Trunk Assessment: Normal  Communication   Communication: No difficulties  Cognition Arousal/Alertness: Awake/alert Behavior During Therapy: WFL for tasks assessed/performed Overall Cognitive Status: Within Functional Limits for tasks assessed                      General Comments      Exercises        Assessment/Plan    PT Assessment Patient needs continued PT services  PT Diagnosis Difficulty walking   PT Problem List Decreased activity tolerance;Decreased balance;Cardiopulmonary status limiting activity  PT Treatment Interventions Gait training;Functional mobility training;Therapeutic activities;Patient/family education;Balance training;Therapeutic exercise  PT Goals (Current goals can be found in the Care Plan section) Acute Rehab PT Goals Patient Stated Goal: return to shopping and home PT Goal Formulation: With patient Time For Goal Achievement: 03/08/14 Potential to Achieve Goals: Good    Frequency Min 3X/week   Barriers to discharge Decreased caregiver support      Co-evaluation               End of Session Equipment Utilized During Treatment: Oxygen Activity Tolerance: Patient tolerated treatment well Patient left: in chair;with call bell/phone within reach Nurse Communication: Mobility  status         Time: [id number] PT Time Calculation (min) (ACUTE ONLY): 21 min   Charges:   PT Evaluation $Initial PT Evaluation Tier I: 1 Procedure PT Treatments $Gait Training: 8-22 mins   PT G CodesDelorse Lek:        Tabor, Rocco Kerkhoff Beth 02/22/2014, 8:19 AM Delaney MeigsMaija Tabor Mackensi Mahadeo, PT [phone]

## 2014-02-22 NOTE — Care Management Note (Addendum)
    Page 1 of 2   02/27/2014     1:46:16 PM CARE MANAGEMENT NOTE 02/27/2014  Patient:  [name],Nicole Maxwell   Account Number:  [id number]  Date Initiated:  02/22/2014  Documentation initiated by:  Letha CapeAYLOR,Raevon Broom  Subjective/Objective Assessment:   dx pna  admit- from home     Action/Plan:   Anticipated DC Date:  02/27/2014   Anticipated DC Plan:  HOME W HOME HEALTH SERVICES      DC Planning Services  CM consult      [facility]   Choice offered to / List presented to:  C-1 Patient        HH arranged  HH-2 PT  HH-1 RN  HH-7 RESPIRATORY THERAPY      HH agency  [facility]   Status of service:  Completed, signed off Medicare Important Message given?  YES (If response is "NO", the following Medicare IM given date fields will be blank) Date Medicare IM given:  02/25/2014 Medicare IM given by:  Letha CapeAYLOR,Bralyn Espino Date Additional Medicare IM given:   Additional Medicare IM given by:    Discharge Disposition:  HOME W HOME HEALTH SERVICES  Per UR Regulation:  Reviewed for med. necessity/level of care/duration of stay  If discussed at Long Length of Stay Meetings, dates discussed:    Comments:  02/27/14 1345 Letha Capeeborah Nahia Nissan RN, BSN [phone] patient dc today, [facility] notified.  02/26/14 1659 Letha Capeeborah Seville Brick RN, BSN [phone] [facility]. [facility] Miranda notified.  Soc will begin 24-48 hrs post dc.  02/25/14 1318 Letha Capeeborah Tationna Fullard RN, BSN [phone] NCM spoke with patient, she states she was not sure if she wants hhpt, for me to come back later to talk to her about it.  Patient has home oxygen with [facility].  02/22/14 1029 Letha Capeeborah Manjot Hinks RN, BSN [phone] patient is from home with pna, NCM will cont to follow for dc needs.

## 2014-02-22 NOTE — Progress Notes (Signed)
Subjective: Feels about the same.  Persistent cough with yellow sputum.  Minimal wheezing. Shortness of breath unchanged.  Objective: Vital signs in last 24 hours: Temp:  [98.2 F (36.8 C)-98.7 F (37.1 C)] 98.2 F (36.8 C) (01/29 0517) Pulse Rate:  [90-106] 90 (01/29 0517) Resp:  [19-24] 21 (01/29 0517) BP: (125-136)/(55-69) 136/68 mmHg (01/29 0517) SpO2:  [90 %-100 %] 100 % (01/29 0517) Weight:  [53.2 kg (117 lb 4.6 oz)] 53.2 kg (117 lb 4.6 oz) (01/28 1817) Weight change:  Last BM Date: 02/21/14  CBG (last 3)   Recent Labs  02/21/14 2126  GLUCAP 103*    Intake/Output from previous day: 01/28 0701 - 01/29 0700 In: 1140 [P.O.:990; IV Piggyback:150] Out: 1300 [Urine:1300] Intake/Output this shift:    General appearance: alert and coughing paroxyms with deep cough Eyes: no scleral icterus Throat: oropharynx moist without erythema Resp: left basilar rhonchi and expiratory wheezing Cardio: regular rate and rhythm GI: soft, non-tender; bowel sounds normal; no masses,  no organomegaly Extremities: no clubbing, cyanosis or edema   Lab Results:  Recent Labs  02/21/14 1924  NA 143  K 4.4  CL 101  CO2 30  GLUCOSE 181*  BUN 12  CREATININE 0.74  CALCIUM 9.0    Recent Labs  02/21/14 1924  AST 22  ALT 25  ALKPHOS 87  BILITOT 0.3  PROT 6.6  ALBUMIN 2.9*    Recent Labs  02/21/14 1924 02/22/14 0625  WBC 18.2* 14.5*  HGB 10.5* 8.9*  HCT 34.8* 29.6*  MCV 86.6 87.1  PLT 331 277    Studies/Results: No results found.   Medications: Scheduled: . albuterol  2.5 mg Nebulization Q6H  . aspirin EC  81 mg Oral Daily  . budesonide-formoterol  2 puff Inhalation BID  . dextromethorphan-guaiFENesin  1 tablet Oral BID  . enoxaparin (LOVENOX) injection  40 mg Subcutaneous Q24H  . feeding supplement (GLUCERNA SHAKE)  237 mL Oral TID BM  . fluticasone  1 spray Each Nare Daily  . furosemide  40 mg Oral Daily  . insulin aspart  0-9 Units Subcutaneous TID WC  .  irbesartan  150 mg Oral Daily  . metFORMIN  500 mg Oral BID WC  . pantoprazole  40 mg Oral Q0600  . piperacillin-tazobactam (ZOSYN)  IV  3.375 g Intravenous 3 times per day  . predniSONE  60 mg Oral Q breakfast  . rosuvastatin  10 mg Oral Daily  . sodium chloride  3 mL Intravenous Q12H  . tiotropium  18 mcg Inhalation Daily  . vancomycin  500 mg Intravenous Q12H  . varenicline  1 mg Oral BID WC   Continuous:   Assessment/Plan: Principal Problem: 1. Healthcare-associated pneumonia -continue Vancomycin/Zosyn to cover healthcare associated organisms given recent hospitalization (Day 2).   Will transition to Levaquin prior to discharge if improving. Leukocytosis improving. Repeat CXR today.  Active Problems: 2. Severe chronic obstructive pulmonary disease with acute exacerbation- continue prednisone (can taper over the weekend if doing well) and continue Symbicort, Spiriva, and albuterol as needed. Continue oxygen. 3. HTN (hypertension)-controlled on home medications. 4. Diabetes mellitus type 2, noninsulin dependent-continue metformin and sliding scale insulin 5. Chronic diastolic congestive heart failure-continue Lasix. Appears well compensated. 6. Dysphagia- speech therapy consult with MBSS (if needed) to evaluate for aspiration risk. Consider outpatient GI evaluation of dysphagia. 7. Tobacco abuse- Have strongly encouraged smoking cessation. Continue Chantix. 8. Disposition- anticipate discharge to home in 2-3 days if improving on IV antibiotics- would benefit from home health  respiratory therapy, RN to assist with medications.   LOS: 1 day   Nicole Maxwell 02/22/2014, 7:21 AM

## 2014-02-22 NOTE — Evaluation (Signed)
Occupational Therapy Evaluation Patient Details Name: Nicole Maxwell MRN: [id number] DOB: [birth year] Today's Date: 02/22/2014    History of Present Illness Pt admitted with SOB and HCAP after recent admission for COPD exacerbation   Clinical Impression   PTA pt lived at home with her granddaughter and reports independence with ADLs. Pt currently at Mod I level with ADLs and functional mobility and remained at 98% on 2LO2 during OT session, remaining in room for duration. Educated pt on energy conservation and deep breathing techniques. No further acute OT needs.     Follow Up Recommendations  No OT follow up    Equipment Recommendations  None recommended by OT    Recommendations for Other Services       Precautions / Restrictions Precautions Precautions: Fall Precaution Comments: watch sats Restrictions Weight Bearing Restrictions: No      Mobility Bed Mobility Overal bed mobility: Modified Independent                Transfers Overall transfer level: Modified independent                    Balance Overall balance assessment: Needs assistance   Sitting balance-Leahy Scale: Good       Standing balance-Leahy Scale: Good                              ADL Overall ADL's : Modified independent                                       General ADL Comments: Pt overall Mod I with ADLs. Educated on energy conservation. Pt reports she does not use shower seat and "hasn't thought about" using it. Encouraged pt to use as she reports SOB during showers due to steam and level of endurance. Pt does take rest breaks as needed when cooking. Pt sats at 98% throughout OT session on 2L O2.                Pertinent Vitals/Pain Pain Assessment: No/denies pain        Extremity/Trunk Assessment Upper Extremity Assessment Upper Extremity Assessment: Overall WFL for tasks assessed   Lower Extremity Assessment Lower Extremity Assessment:  Overall WFL for tasks assessed   Cervical / Trunk Assessment Cervical / Trunk Assessment: Normal   Communication Communication Communication: No difficulties   Cognition Arousal/Alertness: Awake/alert Behavior During Therapy: WFL for tasks assessed/performed Overall Cognitive Status: Within Functional Limits for tasks assessed                                Home Living Family/patient expects to be discharged to:: Private residence Living Arrangements: Other relatives (granddaughter) Available Help at Discharge: Family;Available PRN/intermittently (granddaughter works) Type of Home: House Home Access: Level entry     Home Layout: One level     Bathroom Shower/Tub: Tub/shower unit Shower/tub characteristics: Engineer, building servicesCurtain Bathroom Toilet: Standard     Home Equipment: Emergency planning/management officerhower seat;Walker - 2 wheels;Cane - single point   Additional Comments: lives with granddgtr, all equipment belonged to spouse who was taller than pt      Prior Functioning/Environment Level of Independence: Independent             OT Diagnosis: Generalized weakness;Other (comment) (decreased cardiopulmonary status)    End of Session  Equipment Utilized During Treatment: Oxygen  Activity Tolerance: Patient tolerated treatment well Patient left: in chair;with call bell/phone within reach   Time: 1107-1128 OT Time Calculation (min): 21 min Charges:  OT General Charges $OT Visit: 1 Procedure OT Evaluation $Initial OT Evaluation Tier I: 1 Procedure G-Codes:    Rae Lips 02-24-14, 11:48 AM  Carney Living, OTR/L Occupational Therapist [phone] (pager)

## 2014-02-23 LAB — CBC
HCT: 29.4 % — ABNORMAL LOW (ref 36.0–46.0)
Hemoglobin: 9 g/dL — ABNORMAL LOW (ref 12.0–15.0)
MCH: 26.1 pg (ref 26.0–34.0)
MCHC: 30.6 g/dL (ref 30.0–36.0)
MCV: 85.2 fL (ref 78.0–100.0)
Platelets: 302 K/uL (ref 150–400)
RBC: 3.45 MIL/uL — ABNORMAL LOW (ref 3.87–5.11)
RDW: 15.2 % (ref 11.5–15.5)
WBC: 13.3 K/uL — ABNORMAL HIGH (ref 4.0–10.5)

## 2014-02-23 LAB — GLUCOSE, CAPILLARY
GLUCOSE-CAPILLARY: 202 mg/dL — AB (ref 70–99)
Glucose-Capillary: 93 mg/dL (ref 70–99)
Glucose-Capillary: 127 mg/dL — ABNORMAL HIGH (ref 70–99)
Glucose-Capillary: 115 mg/dL — ABNORMAL HIGH (ref 70–99)

## 2014-02-23 LAB — BASIC METABOLIC PANEL
Anion gap: 5 (ref 5–15)
BUN: 13 mg/dL (ref 6–23)
CO2: 39 mmol/L — ABNORMAL HIGH (ref 19–32)
Calcium: 8.6 mg/dL (ref 8.4–10.5)
Chloride: 98 mmol/L (ref 96–112)
Creatinine, Ser: 0.78 mg/dL (ref 0.50–1.10)
GFR calc Af Amer: >90 mL/min (ref >90)
GFR calc non Af Amer: 83 mL/min — ABNORMAL LOW (ref >90)
Glucose, Bld: 111 mg/dL — ABNORMAL HIGH (ref 70–99)
Potassium: 4.1 mmol/L (ref 3.5–5.1)
Sodium: 142 mmol/L (ref 135–145)

## 2014-02-23 LAB — VANCOMYCIN, TROUGH: Vancomycin Tr: 10.1 ug/mL (ref 10.0–20.0)

## 2014-02-23 MED ORDER — HYDROCODONE-ACETAMINOPHEN 5-325 MG PO TABS
1.0000 | ORAL_TABLET | Freq: Four times a day (QID) | ORAL | Status: DC | PRN
  Administered 2014-02-23 – 2014-02-26 (×3): 1 via ORAL
  Filled 2014-02-23 (×4): qty 1

## 2014-02-23 MED ORDER — VANCOMYCIN HCL IN DEXTROSE 750-5 MG/150ML-% IV SOLN
750.0000 mg | Freq: Two times a day (BID) | INTRAVENOUS | Status: DC
  Administered 2014-02-24 – 2014-02-26 (×6): 750 mg via INTRAVENOUS
  Filled 2014-02-23 (×8): qty 150

## 2014-02-23 MED ORDER — VANCOMYCIN HCL IN DEXTROSE 1-5 GM/200ML-% IV SOLN
1000.0000 mg | INTRAVENOUS | Status: AC
  Administered 2014-02-23: 1000 mg via INTRAVENOUS
  Filled 2014-02-23: qty 200

## 2014-02-23 NOTE — Progress Notes (Addendum)
Subjective: Feels about the same.   Persistent cough with minimal yellow sputum.  Minimal wheezing. Shortness of breath unchanged. Patient concerned about her persistent cough, disease knowledge that she does continue to smoke on occasion at home, denies chest pain, but does admit to some abdominal wall pain especially with coughing.  Objective: Vital signs in last 24 hours: Temp:  [98.5 F (36.9 C)-99.4 F (37.4 C)] 98.6 F (37 C) (01/30 0623) Pulse Rate:  [81-97] 81 (01/30 0623) Resp:  [15-18] 17 (01/30 0623) BP: (125-149)/(54-71) 126/71 mmHg (01/30 0623) SpO2:  [89 %-100 %] 94 % (01/30 0813) Weight change:  Last BM Date: 02/21/14  CBG (last 3)   Recent Labs  02/22/14 1716 02/23/14 0745 02/23/14 1143  GLUCAP 196* 93 127*    Intake/Output from previous day: 01/29 0701 - 01/30 0700 In: 680 [P.O.:480; IV Piggyback:200] Out: 2600 [Urine:2600] Intake/Output this shift: Total I/O In: 558 [P.O.:358; IV Piggyback:200] Out: -   General appearance: alert and coughing paroxyms with deep cough, hoarse voice Eyes: no scleral icterus Throat: oropharynx moist without erythema Resp: left basilar rhonchi and expiratory wheezing now bilateral Cardio: regular rate and rhythm GI: soft, non-tender; bowel sounds normal; no masses,  no organomegaly Extremities: no clubbing, cyanosis or edema   Lab Results:  Recent Labs  02/22/14 0625 02/23/14 0446  NA 143 142  K 4.1 4.1  CL 102 98  CO2 35* 39*  GLUCOSE 86 111*  BUN 13 13  CREATININE 0.81 0.78  CALCIUM 8.8 8.6    Recent Labs  02/21/14 1924  AST 22  ALT 25  ALKPHOS 87  BILITOT 0.3  PROT 6.6  ALBUMIN 2.9*    Recent Labs  02/22/14 0625 02/23/14 0446  WBC 14.5* 13.3*  HGB 8.9* 9.0*  HCT 29.6* 29.4*  MCV 87.1 85.2  PLT 277 302    Studies/Results: Dg Chest 2 View  02/22/2014   CLINICAL DATA:  Cough, congestion, shortness of breath common  EXAM: CHEST  2 VIEW  COMPARISON:  01/06/2014  FINDINGS: Ill-defined  patchy nodular airspace process obscures the left cardiac border and is anterior on the lateral view compatible with lingula airspace disease. Appearance is compatible with pneumonia. There is a small associated left pleural effusion. Right lung remains clear. Stable mild hyperinflation. Negative for pneumothorax. Normal heart size and vascularity. Atherosclerosis noted of the aorta. Trachea is midline. Minor thoracic degenerative changes.  IMPRESSION: Lingula airspace process compatible with pneumonia and a small associated left effusion.  Recommend radiographic follow-up to document complete resolution.   Electronically Signed   By: Ruel Favorsrevor  Shick M.D.   On: 02/22/2014 10:50     Medications: Scheduled: . albuterol  2.5 mg Nebulization Q6H  . aspirin EC  81 mg Oral Daily  . budesonide-formoterol  2 puff Inhalation BID  . dextromethorphan-guaiFENesin  1 tablet Oral BID  . enoxaparin (LOVENOX) injection  40 mg Subcutaneous Q24H  . feeding supplement (GLUCERNA SHAKE)  237 mL Oral TID BM  . fluticasone  1 spray Each Nare Daily  . furosemide  40 mg Oral Daily  . insulin aspart  0-9 Units Subcutaneous TID WC  . irbesartan  150 mg Oral Daily  . metFORMIN  500 mg Oral BID WC  . pantoprazole  40 mg Oral Q0600  . piperacillin-tazobactam (ZOSYN)  IV  3.375 g Intravenous 3 times per day  . predniSONE  60 mg Oral Q breakfast  . rosuvastatin  10 mg Oral Daily  . sodium chloride  3 mL Intravenous  Q12H  . tiotropium  18 mcg Inhalation Daily  . vancomycin  500 mg Intravenous Q12H  . varenicline  1 mg Oral BID WC   Continuous:   Assessment/Plan: Principal Problem: 1. Healthcare-associated pneumonia -continue Vancomycin/Zosyn to cover healthcare associated organisms given recent hospitalization (Day 3).   Marginal if any improvement overnight, but leukocytosis is improving Will transition to Levaquin prior to discharge if improving. Leukocytosis improving. Repeat CXR tomorrow.  Active Problems: 2.  Severe chronic obstructive pulmonary disease with acute exacerbation- continue prednisone (can taper over the weekend if doing well)-will hold taper given lack of significant improvement- and continue Symbicort, Spiriva, and albuterol as needed. Continue oxygen. 3. HTN (hypertension)-controlled on home medications. 4. Diabetes mellitus type 2, noninsulin dependent-continue metformin and sliding scale insulin 5. Chronic diastolic congestive heart failure-continue Lasix. Appears well compensated. 6. Dysphagia- recommendations appreciated with continued regular thin liquid diet, postural changes and swallow maneuvers appreciated and discussed with the patient.  Consider outpatient GI evaluation of dysphagia. 7. Tobacco abuse- Have strongly encouraged smoking cessation. Continue Chantix. Discussed again today 8. Disposition- anticipate discharge to home in 2-3 days if improving on IV antibiotics- would benefit from home health respiratory therapy, RN to assist with medications.   LOS: 2 days   Ladell Lea R 02/23/2014, 1:44 PM  Encouraged patient as staff for hydrocodone prior to bedtime for cough suppressant effect

## 2014-02-23 NOTE — Progress Notes (Signed)
ANTIBIOTIC CONSULT NOTE - FOLLOW UP  Pharmacy Consult for Vancomycin Indication: pneumonia  Allergies  Allergen Reactions  . Brovana [Arformoterol Tartrate] Shortness Of Breath and Cough    General intolerance  . Budesonide Shortness Of Breath and Cough    General intolerance    Patient Measurements: Height: 5\' 4"  (162.6 cm) Weight: 117 lb 4.6 oz (53.2 kg) IBW/kg (Calculated) : 54.7 Adjusted Body Weight:   Vital Signs: Temp: 98.5 F (36.9 C) (01/30 1502) Temp Source: Oral (01/30 1502) BP: 130/76 mmHg (01/30 1502) Pulse Rate: 80 (01/30 1502) Intake/Output from previous day: 01/29 0701 - 01/30 0700 In: 680 [P.O.:480; IV Piggyback:200] Out: 2600 [Urine:2600] Intake/Output from this shift:    Labs:  Recent Labs  02/21/14 1924 02/22/14 0625 02/23/14 0446  WBC 18.2* 14.5* 13.3*  HGB 10.5* 8.9* 9.0*  PLT 331 277 302  CREATININE 0.74 0.81 0.78   Estimated Creatinine Clearance: 55.7 mL/min (by C-G formula based on Cr of 0.78).  Recent Labs  02/23/14 1835  VANCOTROUGH 10.1     Microbiology: No results found for this or any previous visit (from the past 720 hour(s)).  Anti-infectives    Start     Dose/Rate Route Frequency Ordered Stop   02/22/14 0800  vancomycin (VANCOCIN) 500 mg in sodium chloride 0.9 % 100 mL IVPB     500 mg100 mL/hr over 60 Minutes Intravenous Every 12 hours 02/21/14 1902     02/21/14 2000  piperacillin-tazobactam (ZOSYN) IVPB 3.375 g     3.375 g12.5 mL/hr over 240 Minutes Intravenous 3 times per day 02/21/14 1902     02/21/14 1915  vancomycin (VANCOCIN) IVPB 750 mg/150 ml premix     750 mg150 mL/hr over 60 Minutes Intravenous  Once 02/21/14 1902 02/21/14 2103      Assessment: 69yo female with COPD exacerbation and pneumonia, on day#2 of Vanc/Zosyn with minimal improvement.  Vancomcyin trough this PM is below goal  Goal of Therapy:  Vancomycin trough level 15-20 mcg/ml  Plan:  Vancomycin 1000mg  IV x 1, then 750mg  IV q12 Watch renal  fxn Repeat VT at steady state  Marisue HumbleKendra Omarri Eich, PharmD Clinical Pharmacist [facility]

## 2014-02-24 LAB — BASIC METABOLIC PANEL
Anion gap: 10 (ref 5–15)
BUN: 16 mg/dL (ref 6–23)
CHLORIDE: 94 mmol/L — AB (ref 96–112)
CO2: 34 mmol/L — ABNORMAL HIGH (ref 19–32)
CREATININE: 0.72 mg/dL (ref 0.50–1.10)
Calcium: 8.4 mg/dL (ref 8.4–10.5)
GFR calc Af Amer: >90 mL/min (ref >90)
GFR, EST NON AFRICAN AMERICAN: 86 mL/min — AB (ref >90)
GLUCOSE: 108 mg/dL — AB (ref 70–99)
Potassium: 3.4 mmol/L — ABNORMAL LOW (ref 3.5–5.1)
Sodium: 138 mmol/L (ref 135–145)

## 2014-02-24 LAB — CBC
HCT: 31.5 % — ABNORMAL LOW (ref 36.0–46.0)
HEMOGLOBIN: 9.5 g/dL — AB (ref 12.0–15.0)
MCH: 26.3 pg (ref 26.0–34.0)
MCHC: 30.2 g/dL (ref 30.0–36.0)
MCV: 87.3 fL (ref 78.0–100.0)
Platelets: 276 10*3/uL (ref 150–400)
RBC: 3.61 MIL/uL — AB (ref 3.87–5.11)
RDW: 14.9 % (ref 11.5–15.5)
WBC: 15 10*3/uL — ABNORMAL HIGH (ref 4.0–10.5)

## 2014-02-24 LAB — GLUCOSE, CAPILLARY
GLUCOSE-CAPILLARY: 81 mg/dL (ref 70–99)
Glucose-Capillary: 114 mg/dL — ABNORMAL HIGH (ref 70–99)
Glucose-Capillary: 190 mg/dL — ABNORMAL HIGH (ref 70–99)
Glucose-Capillary: 97 mg/dL (ref 70–99)

## 2014-02-24 MED ORDER — POTASSIUM CHLORIDE CRYS ER 20 MEQ PO TBCR
20.0000 meq | EXTENDED_RELEASE_TABLET | Freq: Two times a day (BID) | ORAL | Status: AC
  Administered 2014-02-24 (×2): 20 meq via ORAL
  Filled 2014-02-24 (×2): qty 1

## 2014-02-24 NOTE — Progress Notes (Signed)
Subjective: Patient feels improved compared with yesterday but still with persistent cough with minimal yellow sputum, chest wall pain resolving, but still significant symptoms. Hydrocodone taken at nighttime did help her sleep better last night. Minimal wheezing. Shortness of breath continues but improved slightly. Patient concerned about her persistent cough, disease knowledge that she does continue to smoke on occasion at home, denies chest pain. Denies nausea or vomiting Objective: Vital signs in last 24 hours: Temp:  [97.8 F (36.6 C)-98.5 F (36.9 C)] 98 F (36.7 C) (01/31 0616) Pulse Rate:  [80-86] 82 (01/31 0616) Resp:  [22-28] 22 (01/31 0616) BP: (130-148)/(64-76) 148/64 mmHg (01/31 0616) SpO2:  [92 %-100 %] 92 % (01/31 0731) Weight change:  Last BM Date: 02/21/14  CBG (last 3)   Recent Labs  02/23/14 1636 02/23/14 2206 02/24/14 0736  GLUCAP 202* 115* 81    Intake/Output from previous day: 01/30 0701 - 01/31 0700 In: 1488 [P.O.:1238; IV Piggyback:250] Out: 1950 [Urine:1950] Intake/Output this shift: Total I/O In: 50 [IV Piggyback:50] Out: -   General appearance: alert and coughing paroxyms with deep cough, hoarse voice Eyes: no scleral icterus Throat: oropharynx moist without erythema Resp: left basilar rhonchi and expiratory wheezing now improved with improved air movement minimal wheezing and minimal rhonchi Cardio: regular rate and rhythm GI: soft, non-tender; bowel sounds normal; no masses,  no organomegaly Extremities: no clubbing, cyanosis or edema   Lab Results:  Recent Labs  02/23/14 0446 02/24/14 0350  NA 142 138  K 4.1 3.4*  CL 98 94*  CO2 39* 34*  GLUCOSE 111* 108*  BUN 13 16  CREATININE 0.78 0.72  CALCIUM 8.6 8.4    Recent Labs  02/21/14 1924  AST 22  ALT 25  ALKPHOS 87  BILITOT 0.3  PROT 6.6  ALBUMIN 2.9*    Recent Labs  02/23/14 0446 02/24/14 0350  WBC 13.3* 15.0*  HGB 9.0* 9.5*  HCT 29.4* 31.5*  MCV 85.2 87.3  PLT  302 276    Studies/Results: Dg Chest 2 View  02/22/2014   CLINICAL DATA:  Cough, congestion, shortness of breath common  EXAM: CHEST  2 VIEW  COMPARISON:  01/06/2014  FINDINGS: Ill-defined patchy nodular airspace process obscures the left cardiac border and is anterior on the lateral view compatible with lingula airspace disease. Appearance is compatible with pneumonia. There is a small associated left pleural effusion. Right lung remains clear. Stable mild hyperinflation. Negative for pneumothorax. Normal heart size and vascularity. Atherosclerosis noted of the aorta. Trachea is midline. Minor thoracic degenerative changes.  IMPRESSION: Lingula airspace process compatible with pneumonia and a small associated left effusion.  Recommend radiographic follow-up to document complete resolution.   Electronically Signed   By: Ruel Favors M.D.   On: 02/22/2014 10:50     Medications: Scheduled: . albuterol  2.5 mg Nebulization Q6H  . aspirin EC  81 mg Oral Daily  . budesonide-formoterol  2 puff Inhalation BID  . dextromethorphan-guaiFENesin  1 tablet Oral BID  . enoxaparin (LOVENOX) injection  40 mg Subcutaneous Q24H  . feeding supplement (GLUCERNA SHAKE)  237 mL Oral TID BM  . fluticasone  1 spray Each Nare Daily  . furosemide  40 mg Oral Daily  . insulin aspart  0-9 Units Subcutaneous TID WC  . irbesartan  150 mg Oral Daily  . metFORMIN  500 mg Oral BID WC  . pantoprazole  40 mg Oral Q0600  . piperacillin-tazobactam (ZOSYN)  IV  3.375 g Intravenous 3 times per day  .  predniSONE  60 mg Oral Q breakfast  . rosuvastatin  10 mg Oral Daily  . sodium chloride  3 mL Intravenous Q12H  . tiotropium  18 mcg Inhalation Daily  . vancomycin  750 mg Intravenous Q12H  . varenicline  1 mg Oral BID WC   Continuous:   Assessment/Plan: Principal Problem: 1. Healthcare-associated pneumonia -continue Vancomycin/Zosyn to cover healthcare associated organisms given recent hospitalization (Day 4).   Some  improvement overnight, but leukocytosis overall is improving Will transition to Levaquin prior to discharge if improving.Repeat CXR tomorrow.  Active Problems: 2. Severe chronic obstructive pulmonary disease with acute exacerbation- continue prednisone (can taper over the weekend if doing well)-will hold taper given lack of major/significant improvement- and continue Symbicort, Spiriva, and albuterol as needed. Continue oxygen. 3. HTN (hypertension)-controlled on home medications. 4. Diabetes mellitus type 2, noninsulin dependent-continue metformin and sliding scale insulin 5. Chronic diastolic congestive heart failure-continue Lasix. Appears well compensated. 6. Dysphagia- recommendations appreciated with continued regular thin liquid diet, postural changes and swallow maneuvers appreciated and discussed with the patient.  Consider outpatient GI evaluation of dysphagia. 7. Tobacco abuse- Have strongly encouraged smoking cessation. Continue Chantix. Discussed again today 8. Disposition- anticipate discharge to home in 2-3 days if improving on IV antibiotics- would benefit from home health respiratory therapy, RN to assist with medications. 9. Hypokalemia-we'll replete   LOS: 3 days   Jerie Basford R 02/24/2014, 8:24 AM  Encouraged patient as staff for hydrocodone prior to bedtime for cough suppressant effect

## 2014-02-25 ENCOUNTER — Inpatient Hospital Stay (HOSPITAL_COMMUNITY): Payer: Medicare Other

## 2014-02-25 LAB — BASIC METABOLIC PANEL
ANION GAP: 8 (ref 5–15)
BUN: 16 mg/dL (ref 6–23)
CHLORIDE: 98 mmol/L (ref 96–112)
CO2: 36 mmol/L — ABNORMAL HIGH (ref 19–32)
CREATININE: 0.87 mg/dL (ref 0.50–1.10)
Calcium: 8.8 mg/dL (ref 8.4–10.5)
GFR calc non Af Amer: 66 mL/min — ABNORMAL LOW (ref >90)
GFR, EST AFRICAN AMERICAN: 77 mL/min — AB (ref >90)
Glucose, Bld: 113 mg/dL — ABNORMAL HIGH (ref 70–99)
POTASSIUM: 3.6 mmol/L (ref 3.5–5.1)
Sodium: 142 mmol/L (ref 135–145)

## 2014-02-25 LAB — GLUCOSE, CAPILLARY
Glucose-Capillary: 94 mg/dL (ref 70–99)
Glucose-Capillary: 144 mg/dL — ABNORMAL HIGH (ref 70–99)
Glucose-Capillary: 200 mg/dL — ABNORMAL HIGH (ref 70–99)
Glucose-Capillary: 113 mg/dL — ABNORMAL HIGH (ref 70–99)

## 2014-02-25 LAB — CBC
HCT: 34 % — ABNORMAL LOW (ref 36.0–46.0)
Hemoglobin: 10.2 g/dL — ABNORMAL LOW (ref 12.0–15.0)
MCH: 25.6 pg — ABNORMAL LOW (ref 26.0–34.0)
MCHC: 30 g/dL (ref 30.0–36.0)
MCV: 85.4 fL (ref 78.0–100.0)
Platelets: 275 10*3/uL (ref 150–400)
RBC: 3.98 MIL/uL (ref 3.87–5.11)
RDW: 15.3 % (ref 11.5–15.5)
WBC: 17.1 10*3/uL — AB (ref 4.0–10.5)

## 2014-02-25 MED ORDER — PREDNISONE 20 MG PO TABS
40.0000 mg | ORAL_TABLET | Freq: Every day | ORAL | Status: DC
  Administered 2014-02-25 – 2014-02-27 (×3): 40 mg via ORAL
  Filled 2014-02-25 (×4): qty 2

## 2014-02-25 NOTE — Progress Notes (Signed)
Subjective: Reports slow improvement.  Still having some coughing episodes but improved.    Objective: Vital signs in last 24 hours: Temp:  [98.2 F (36.8 C)-98.6 F (37 C)] 98.2 F (36.8 C) (02/01 0548) Pulse Rate:  [85] 85 (02/01 0548) Resp:  [22-24] 24 (02/01 0548) BP: (124-140)/(58-63) 124/63 mmHg (02/01 0548) SpO2:  [96 %-100 %] 96 % (02/01 0729) FiO2 (%):  [2 %] 2 % (01/31 1424) Weight change:  Last BM Date: 02/23/14  CBG (last 3)   Recent Labs  02/24/14 1120 02/24/14 1612 02/24/14 2128  GLUCAP 114* 190* 97    Intake/Output from previous day: 01/31 0701 - 02/01 0700 In: 712 [P.O.:462; IV Piggyback:250] Out: 1200 [Urine:1200] Intake/Output this shift:    General appearance: alert and chronically ill, minimal coughing Eyes: no scleral icterus Throat: oropharynx moist without erythema Resp: minimal expiratory wheezing Cardio: regular rate and rhythm GI: soft, non-tender; bowel sounds normal; no masses,  no organomegaly Extremities: no clubbing, cyanosis or edema   Lab Results:  Recent Labs  02/24/14 0350 02/25/14 0556  NA 138 142  K 3.4* 3.6  CL 94* 98  CO2 34* 36*  GLUCOSE 108* 113*  BUN 16 16  CREATININE 0.72 0.87  CALCIUM 8.4 8.8     Recent Labs  02/24/14 0350 02/25/14 0556  WBC 15.0* 17.1*  HGB 9.5* 10.2*  HCT 31.5* 34.0*  MCV 87.3 85.4  PLT 276 275    Studies/Results: No results found.   Medications: Scheduled: . albuterol  2.5 mg Nebulization Q6H  . aspirin EC  81 mg Oral Daily  . budesonide-formoterol  2 puff Inhalation BID  . dextromethorphan-guaiFENesin  1 tablet Oral BID  . enoxaparin (LOVENOX) injection  40 mg Subcutaneous Q24H  . feeding supplement (GLUCERNA SHAKE)  237 mL Oral TID BM  . fluticasone  1 spray Each Nare Daily  . furosemide  40 mg Oral Daily  . insulin aspart  0-9 Units Subcutaneous TID WC  . irbesartan  150 mg Oral Daily  . metFORMIN  500 mg Oral BID WC  . pantoprazole  40 mg Oral Q0600  .  piperacillin-tazobactam (ZOSYN)  IV  3.375 g Intravenous 3 times per day  . predniSONE  60 mg Oral Q breakfast  . rosuvastatin  10 mg Oral Daily  . sodium chloride  3 mL Intravenous Q12H  . tiotropium  18 mcg Inhalation Daily  . vancomycin  750 mg Intravenous Q12H  . varenicline  1 mg Oral BID WC   Continuous:   Assessment/Plan: Principal Problem: 1. HCAP (healthcare-associated pneumonia)- slow improvement.  Continue Vanc/Zosyn.  Repeat CXR pending.  Active Problems: 2.  Severe COPD with acute exacerbation- stable- continue steroids- decrease to 40mg  daily and continue Symbicort/Spiriva and nebs.  Continue O2 (uses at home). 3. HTN (hypertension)- controlled on home meds. 4. Diabetes mellitus type 2, noninsulin dependent- fairly well controlled on metformin and SSI. 5. Chronic diastolic congestive heart failure- well compensated on Lasix. 6. Dysphagia- MBSS pending.  Consider Esophogram.  Too unstable for EGD at this point so will hold off on GI evaluation and reconsider as outpatient. Diet per ST. 7. Disposition- anticipate discharge in 1-2 days if continued improvement with home health RN, Respiratory Therapy.   LOS: 4 days   [name], Anely Spiewak 02/25/2014, 7:36 AM

## 2014-02-26 ENCOUNTER — Inpatient Hospital Stay (HOSPITAL_COMMUNITY): Payer: Medicare Other

## 2014-02-26 LAB — BASIC METABOLIC PANEL
Anion gap: 9 (ref 5–15)
BUN: 19 mg/dL (ref 6–23)
CO2: 32 mmol/L (ref 19–32)
Calcium: 8.6 mg/dL (ref 8.4–10.5)
Chloride: 99 mmol/L (ref 96–112)
Creatinine, Ser: 0.82 mg/dL (ref 0.50–1.10)
GFR calc Af Amer: 83 mL/min — ABNORMAL LOW (ref >90)
GFR calc non Af Amer: 71 mL/min — ABNORMAL LOW (ref >90)
GLUCOSE: 101 mg/dL — AB (ref 70–99)
Potassium: 3.5 mmol/L (ref 3.5–5.1)
Sodium: 140 mmol/L (ref 135–145)

## 2014-02-26 LAB — GLUCOSE, CAPILLARY
GLUCOSE-CAPILLARY: 166 mg/dL — AB (ref 70–99)
GLUCOSE-CAPILLARY: 101 mg/dL — AB (ref 70–99)
Glucose-Capillary: 82 mg/dL (ref 70–99)
Glucose-Capillary: 153 mg/dL — ABNORMAL HIGH (ref 70–99)

## 2014-02-26 NOTE — Progress Notes (Signed)
Physical Therapy Treatment Patient Details Name: Nicole Maxwell MRN: [id number] DOB: 1945/07/02 Today's Date: 02/26/2014    History of Present Illness Pt admitted with SOB and HCAP after recent admission for COPD exacerbation    PT Comments    Pt is making slow, but steady progress with therapy. Pt continues to have a nonproductive cough during therapy. Pt is able to ambulate in the hall with supervision with O2. Pt does continue to demonstrate SOB with exertion. Pt would continue to benefit from skilled PT to maximize mobility and Independence until D/C home.  Follow Up Recommendations  Home health PT     Equipment Recommendations  None recommended by PT    Recommendations for Other Services       Precautions / Restrictions Precautions Precautions: Fall;Other (comment) (SOB with exertion) Restrictions Weight Bearing Restrictions: No    Mobility  Bed Mobility Overal bed mobility: Modified Independent                Transfers Overall transfer level: Modified independent Equipment used: None                Ambulation/Gait Ambulation/Gait assistance: Supervision Ambulation Distance (Feet): 160 Feet Assistive device:  (pushed IV pole) Gait Pattern/deviations: Step-through pattern;Decreased stride length Gait velocity: decreased   General Gait Details: standing rest break with gait, ambulation with 3 liters O2. O2 sats 88-92% with gait.   Stairs            Wheelchair Mobility    Modified Rankin (Stroke Patients Only)       Balance Overall balance assessment: Needs assistance         Standing balance support: No upper extremity supported Standing balance-Leahy Scale: Good                      Cognition Arousal/Alertness: Awake/alert Behavior During Therapy: WFL for tasks assessed/performed Overall Cognitive Status: Within Functional Limits for tasks assessed                      Exercises      General Comments        Pertinent Vitals/Pain Pain Assessment: No/denies pain    Home Living                      Prior Function            PT Goals (current goals can now be found in the care plan section) Progress towards PT goals: Progressing toward goals    Frequency  Min 3X/week    PT Plan Current plan remains appropriate    Co-evaluation             End of Session Equipment Utilized During Treatment: Gait belt;Oxygen Activity Tolerance: Patient limited by fatigue Patient left: in bed;with call bell/phone within reach     Time: 1055-1120 PT Time Calculation (min) (ACUTE ONLY): 25 min  Charges:  $Gait Training: 23-37 mins                    G Codes:      Nicole Maxwell, Nicole Maxwell 02/26/2014, 11:31 AM

## 2014-02-26 NOTE — Progress Notes (Signed)
ANTIBIOTIC CONSULT NOTE - FOLLOW UP  Pharmacy Consult for Vancomycin Indication: pneumonia  Allergies  Allergen Reactions  . Brovana [Arformoterol Tartrate] Shortness Of Breath and Cough    General intolerance  . Budesonide Shortness Of Breath and Cough    General intolerance    Patient Measurements: Height: 5\' 4"  (162.6 cm) Weight: 117 lb 4.6 oz (53.2 kg) IBW/kg (Calculated) : 54.7 Adjusted Body Weight:   Vital Signs: Temp: 98.3 F (36.8 C) (02/02 0607) Temp Source: Oral (02/02 0607) BP: 124/58 mmHg (02/02 0607) Pulse Rate: 82 (02/02 0607) Intake/Output from previous day: 02/01 0701 - 02/02 0700 In: 480 [P.O.:480] Out: 850 [Urine:850] Intake/Output from this shift:    Labs:  Recent Labs  02/24/14 0350 02/25/14 0556 02/26/14 0505  WBC 15.0* 17.1*  --   HGB 9.5* 10.2*  --   PLT 276 275  --   CREATININE 0.72 0.87 0.82   Estimated Creatinine Clearance: 54.4 mL/min (by C-G formula based on Cr of 0.82).  Recent Labs  02/23/14 1835  VANCOTROUGH 10.1     Microbiology: No results found for this or any previous visit (from the past 720 hour(s)).  Anti-infectives    Start     Dose/Rate Route Frequency Ordered Stop   02/24/14 1000  vancomycin (VANCOCIN) IVPB 750 mg/150 ml premix     750 mg150 mL/hr over 60 Minutes Intravenous Every 12 hours 02/23/14 2050     02/23/14 2130  vancomycin (VANCOCIN) IVPB 1000 mg/200 mL premix     1,000 mg200 mL/hr over 60 Minutes Intravenous NOW 02/23/14 2050 02/23/14 2230   02/22/14 0800  vancomycin (VANCOCIN) 500 mg in sodium chloride 0.9 % 100 mL IVPB  Status:  Discontinued     500 mg100 mL/hr over 60 Minutes Intravenous Every 12 hours 02/21/14 1902 02/23/14 2050   02/21/14 2000  piperacillin-tazobactam (ZOSYN) IVPB 3.375 g     3.375 g12.5 mL/hr over 240 Minutes Intravenous 3 times per day 02/21/14 1902     02/21/14 1915  vancomycin (VANCOCIN) IVPB 750 mg/150 ml premix     750 mg150 mL/hr over 60 Minutes Intravenous  Once 02/21/14  1902 02/21/14 2103      Assessment: 68yo female with COPD exacerbation and pneumonia, on day#6 of Vanc/Zosyn with slight improvement per patient and same in CXR 02/25/14.  Renal function remains stable.  Goal of Therapy:  Vancomycin trough level 15-20 mcg/ml  Plan:  Cont vanc 750 mg IV q12 hours Watch renal fxn Repeat VT as indicated  Thanks for allowing pharmacy to be a part of this patient's care.  Talbert CageLora Sehar Sedano, PharmD Clinical Pharmacist, [phone]

## 2014-02-26 NOTE — Progress Notes (Addendum)
Late Entry MBS, completed 02/22/14.   02/22/14 1100  SLP Visit Information  SLP Received On 02/22/14  Pain Assessment  Pain Assessment No/denies pain  General Information  HPI Nicole Maxwell is a 69 year old African American female with a history of severe COPD on home oxygen, ongoing tobacco abuse, and diabetes mellitus with vascular and neurologic competitions who presents with increased Cough and congestion. The patient was hospitalized from 12/8 through 12/16 for a COPD exacerbation. Her symptoms improved with prolonged steroid and antibiotic therapy. Since discharged she has continued on her prednisone 5 mg daily, Cipro Riva, and Symbicort. However, she states that she has continued to have increased congestion. Over the past several days she has had increased head and chest congestion with increased productive cough with yellow phlegm. She has had chills but no fever. She has had intermittent diarrhea. Chest x-ray shows new left lower lobe infiltrate.Her daughter does note that she has had some cough with eating recently.   Type of Study MBSS  Reason for Referral Objectively evaluate swallowing function  Previous Swallow Assessment none  Diet Prior to this Study Regular;Thin liquids  Temperature Spikes Noted No  Respiratory Status Nasal cannula  History of Recent Intubation No  Behavior/Cognition Alert;Cooperative;Pleasant mood  Oral Cavity - Dentition Adequate natural dentition  Self-Feeding Abilities Able to feed self  Patient Positioning Upright in chair  Baseline Vocal Quality Hoarse  Volitional Cough Strong;Congested  Volitional Swallow Able to elicit  [facility]  Oral Preparation/Oral Phase  Oral Phase WFL  Pharyngeal Phase  Pharyngeal Phase Impaired  Pharyngeal - Thin  Pharyngeal - Thin Cup Reduced airway/laryngeal closure;Reduced anterior laryngeal mobility;Delayed swallow initiation;Penetration/Aspiration during swallow  Pharyngeal - Thin Straw Reduced airway/laryngeal  closure;Reduced anterior laryngeal mobility;Delayed swallow initiation;Penetration/Aspiration during swallow  Penetration/Aspiration details (thin cup) Material enters airway, remains ABOVE vocal cords then ejected out  Penetration/Aspiration details (thin straw) Material enters airway, remains ABOVE vocal cords then ejected out  Pharyngeal - Solids  Pharyngeal - Puree Delayed swallow initiation;Reduced anterior laryngeal mobility  Pharyngeal - Regular Delayed swallow initiation;Reduced anterior laryngeal mobility  Clinical Impression  Dysphagia Diagnosis Mild cervical esophageal phase dysphagia;Suspected primary esophageal dysphagia  Clinical impression Pt presents with a mild oropharyngeal dysphagia with trace deep flash peentration of most liquids during the swallow. Laryngeal elevation was timely but hyoid excursion with slightly effortful/delayed, likely due to hypertensive appearance of UES. No aspiration occurred. The boluses hesitated in the proximal esophagus, sometimes needing a liquid was to clear. Distally, there was a corkscrew like appearance of barium column, no radiologist present to confirm. Overall pt appears to have a primary esophageal dysphagia that has potential to pose a risk of post prandial aspiration. Pt is recommended to continue a regular texture diet, avoiding very dry, tough textures, following solids with liquids and remaining upright 30-60 minutes after meals. Esophageal assessment may be helpful.   Swallow Evaluation Recommendations  Diet Recommendations Regular;Thin liquid  Liquid Administration via Cup;Straw  Medication Administration Whole meds with liquid  Supervision Patient able to self feed  Compensations Slow rate;Small sips/bites;Follow solids with liquid  Postural Changes and/or Swallow Maneuvers Seated upright 90 degrees;Upright 30-60 min after meal  Oral Care Recommendations Oral care BID  Follow up Recommendations None  Treatment Plan  Treatment Plan  Recommendations Therapy as outlined in treatment plan below  Speech Therapy Frequency (ACUTE ONLY) min 1 x/week  Treatment Duration 1 week  Interventions Patient/family education;Aspiration precaution training  Prognosis  Prognosis for Safe Diet Advancement Good  Individuals Consulted  Consulted  and Agree with Results and Recommendations Patient  Progression Toward Goals  Progression toward goals Progressing toward goals  SLP Time Calculation  SLP Start Time (ACUTE ONLY) 1130  SLP Stop Time (ACUTE ONLY) 1200  SLP Time Calculation (min) (ACUTE ONLY) 30 min  SLP Evaluations  $ SLP Speech Visit 1 Procedure  SLP Evaluations  $MBS Swallow 1 Procedure  $Swallowing Treatment 1 Procedure

## 2014-02-26 NOTE — Progress Notes (Signed)
Speech Language Pathology Treatment:    Patient Details Name: Nicole Maxwell MRN: [id number] DOB: 09-13-1945 Today's Date: 02/26/2014 Time: 0820-0830 SLP Time Calculation (min) (ACUTE ONLY): 10 min  Assessment / Plan / Recommendation Clinical Impression  Goal of session was to educate pt regarding esophageal precautions. Recommended pt sit upright during meals, stay upright for at least 30 minutes following meal, takes small bites of food and alternate bites of food with sips of liquid and drink warm fluids before beginning a meal and during meals to help relax esophagus. Provided pt with esophageal precaution handout. Agree with GI follow-up given signs of esophageal dysphagia on MBS. Speech will sign off.    HPI HPI: Nicole MartinSam is a 69 year old African American female with a history of severe COPD on home oxygen, ongoing tobacco abuse, and diabetes mellitus with vascular and neurologic competitions who presents with increased Cough and congestion. The patient was hospitalized from 12/8 through 12/16 for a COPD exacerbation. Her symptoms improved with prolonged steroid and antibiotic therapy. Since discharged she has continued on her prednisone 5 mg daily, Cipro Riva, and Symbicort. However, she states that she has continued to have increased congestion. Over the past several days she has had increased head and chest congestion with increased productive cough with yellow phlegm. She has had chills but no fever. She has had intermittent diarrhea. Chest x-ray shows new left lower lobe infiltrate.Her daughter does note that she has had some cough with eating recently.    Pertinent Vitals    SLP Plan       Recommendations Diet recommendations: Regular;Thin liquid Liquids provided via: Cup;Straw Medication Administration: Whole meds with liquid Supervision: Patient able to self feed Compensations: Slow rate;Small sips/bites;Follow solids with liquid Postural Changes and/or Swallow Maneuvers: Seated  upright 90 degrees;Upright 30-60 min after meal              Oral Care Recommendations: Oral care BID    GO     Maxwell, Nicole Sprunger 02/26/2014, 8:33 AM

## 2014-02-26 NOTE — Progress Notes (Signed)
Subjective: She reports persistent deep cough with thick sputum production, slight increased wheezing and stable dyspnea.  Food gets stuck in upper esophagus.  No odynophagia.  Eventually will pass and able to resume eating.  Objective: Vital signs in last 24 hours: Temp:  [98.1 F (36.7 C)-99.1 F (37.3 C)] 98.3 F (36.8 C) (02/02 0607) Pulse Rate:  [82-91] 82 (02/02 0607) Resp:  [18-21] 20 (02/02 0607) BP: (124-137)/(56-63) 124/58 mmHg (02/02 0607) SpO2:  [95 %-100 %] 99 % (02/02 0800) Weight change:  Last BM Date: 02/25/14  CBG (last 3)   Recent Labs  02/25/14 1657 02/25/14 2201 02/26/14 0800  GLUCAP 200* 113* 82    Intake/Output from previous day: 02/01 0701 - 02/02 0700 In: 480 [P.O.:480] Out: 850 [Urine:850] Intake/Output this shift:    General appearance: alert and deep coughing paroxyms Eyes: no scleral icterus Throat: oropharynx moist without erythema Resp: slight increased bilateral expiratory wheezing Cardio: regular rate and rhythm GI: soft, non-tender; bowel sounds normal; no masses,  no organomegaly Extremities: no clubbing, cyanosis or edema   Lab Results:  Recent Labs  02/25/14 0556 02/26/14 0505  NA 142 140  K 3.6 3.5  CL 98 99  CO2 36* 32  GLUCOSE 113* 101*  BUN 16 19  CREATININE 0.87 0.82  CALCIUM 8.8 8.6   No results for input(s): AST, ALT, ALKPHOS, BILITOT, PROT, ALBUMIN in the last 72 hours.  Recent Labs  02/24/14 0350 02/25/14 0556  WBC 15.0* 17.1*  HGB 9.5* 10.2*  HCT 31.5* 34.0*  MCV 87.3 85.4  PLT 276 275   No results found for: INR, PROTIME No results for input(s): CKTOTAL, CKMB, CKMBINDEX, TROPONINI in the last 72 hours. No results for input(s): TSH, T4TOTAL, T3FREE, THYROIDAB in the last 72 hours.  Invalid input(s): FREET3 No results for input(s): VITAMINB12, FOLATE, FERRITIN, TIBC, IRON, RETICCTPCT in the last 72 hours.  Studies/Results: Dg Chest 2 View  02/25/2014   CLINICAL DATA:  Pneumonia and shortness of  breath  EXAM: CHEST  2 VIEW  COMPARISON:  PA and lateral chest x-ray of February 22, 2014  FINDINGS: The lungs remain hyperinflated. Patchy increased interstitial density persists at the left lung base but has improved. There are small bilateral pleural effusions which are stable. The heart and pulmonary vascularity are normal. The mediastinum is normal in width. The bony thorax is unremarkable.  IMPRESSION: COPD. Slight interval improvement in the appearance of the lingular pneumonia. There remain small bilateral pleural effusions.  Additional follow-up radiograph in 7-10 days are recommended to assure complete clearing.   Electronically Signed   By: David  [place]   On: 02/25/2014 07:51     Medications: Scheduled: . albuterol  2.5 mg Nebulization Q6H  . aspirin EC  81 mg Oral Daily  . budesonide-formoterol  2 puff Inhalation BID  . dextromethorphan-guaiFENesin  1 tablet Oral BID  . enoxaparin (LOVENOX) injection  40 mg Subcutaneous Q24H  . feeding supplement (GLUCERNA SHAKE)  237 mL Oral TID BM  . fluticasone  1 spray Each Nare Daily  . furosemide  40 mg Oral Daily  . insulin aspart  0-9 Units Subcutaneous TID WC  . irbesartan  150 mg Oral Daily  . metFORMIN  500 mg Oral BID WC  . pantoprazole  40 mg Oral Q0600  . piperacillin-tazobactam (ZOSYN)  IV  3.375 g Intravenous 3 times per day  . predniSONE  40 mg Oral Q breakfast  . rosuvastatin  10 mg Oral Daily  . sodium chloride  3 mL Intravenous Q12H  . tiotropium  18 mcg Inhalation Daily  . vancomycin  750 mg Intravenous Q12H  . varenicline  1 mg Oral BID WC   Continuous:   Assessment/Plan: Principal Problem: 1. HCAP (healthcare-associated pneumonia)- slow improvement. Continue Vanc/Zosyn for additional 24-48 hours given persistent deep cough.  CXR reveals some improvement but persistent LLL infiltrate.  Recheck CBC in am as WBC was increased yesterday. Active Problems: 2. Severe COPD with acute exacerbation- slight increase in  wheezing with decreased Prednisone dose- will continue 40mg  daily and continue Symbicort/Spiriva and nebs. Continue O2 (uses at home). 3. HTN (hypertension)- controlled on home meds. 4. Diabetes mellitus type 2, noninsulin dependent- fairly well controlled on metformin and SSI. 5. Chronic diastolic congestive heart failure- well compensated on Lasix. 6. Dysphagia- No evidence of aspiration on ST evaluation (MBSS report still not available but patient was seen this am by ST who suggested esophageal evaluation).  Will proceed with Esophogram. Too unstable for EGD at this point so will hold off on GI evaluation and reconsider as outpatient.  7. Disposition- anticipate discharge in 1-2 days if improving- order placed for home health RN, Respiratory Therapy, PT.   LOS: 5 days   Nicole Maxwell, Nicole Maxwell 02/26/2014, 9:14 AM

## 2014-02-27 DIAGNOSIS — K224 Dyskinesia of esophagus: Secondary | ICD-10-CM | POA: Diagnosis present

## 2014-02-27 LAB — BASIC METABOLIC PANEL
Anion gap: 5 (ref 5–15)
BUN: 15 mg/dL (ref 6–23)
CO2: 38 mmol/L — AB (ref 19–32)
CREATININE: 0.73 mg/dL (ref 0.50–1.10)
Calcium: 8.7 mg/dL (ref 8.4–10.5)
Chloride: 99 mmol/L (ref 96–112)
GFR calc Af Amer: >90 mL/min (ref >90)
GFR calc non Af Amer: 85 mL/min — ABNORMAL LOW (ref >90)
Glucose, Bld: 85 mg/dL (ref 70–99)
POTASSIUM: 3.4 mmol/L — AB (ref 3.5–5.1)
SODIUM: 142 mmol/L (ref 135–145)

## 2014-02-27 LAB — CBC
HEMATOCRIT: 33.6 % — AB (ref 36.0–46.0)
Hemoglobin: 10.1 g/dL — ABNORMAL LOW (ref 12.0–15.0)
MCH: 26 pg (ref 26.0–34.0)
MCHC: 30.1 g/dL (ref 30.0–36.0)
MCV: 86.6 fL (ref 78.0–100.0)
PLATELETS: 246 10*3/uL (ref 150–400)
RBC: 3.88 MIL/uL (ref 3.87–5.11)
RDW: 15.5 % (ref 11.5–15.5)
WBC: 12.8 10*3/uL — ABNORMAL HIGH (ref 4.0–10.5)

## 2014-02-27 LAB — GLUCOSE, CAPILLARY: GLUCOSE-CAPILLARY: 82 mg/dL (ref 70–99)

## 2014-02-27 MED ORDER — HYDROCODONE-HOMATROPINE 5-1.5 MG/5ML PO SYRP: 5.0000 mL | ORAL_SOLUTION | Freq: Four times a day (QID) | ORAL | Status: DC | PRN

## 2014-02-27 MED ORDER — HYDROCODONE-HOMATROPINE 5-1.5 MG/5ML PO SYRP
5.0000 mL | ORAL_SOLUTION | Freq: Four times a day (QID) | ORAL | Status: DC | PRN
Start: 2014-02-27 — End: 2014-02-27

## 2014-02-27 MED ORDER — BENZONATATE 100 MG PO CAPS: 100.0000 mg | ORAL_CAPSULE | Freq: Three times a day (TID) | ORAL | Status: DC | PRN

## 2014-02-27 MED ORDER — PREDNISONE 20 MG PO TABS: 10.0000 mg | ORAL_TABLET | Freq: Every day | ORAL | Status: DC

## 2014-02-27 MED ORDER — VARENICLINE TARTRATE 1 MG PO TABS: 1.0000 mg | ORAL_TABLET | Freq: Two times a day (BID) | ORAL | Status: DC

## 2014-02-27 MED ORDER — LEVOFLOXACIN 500 MG PO TABS: 500.0000 mg | ORAL_TABLET | Freq: Every day | ORAL | Status: DC

## 2014-02-27 MED ORDER — LEVOFLOXACIN 500 MG PO TABS
500.0000 mg | ORAL_TABLET | Freq: Every day | ORAL | Status: DC
  Administered 2014-02-27: 500 mg via ORAL
  Filled 2014-02-27: qty 1

## 2014-02-27 MED ORDER — PREDNISONE 10 MG PO TABS: ORAL_TABLET | ORAL | Status: DC

## 2014-02-27 NOTE — Discharge Instructions (Signed)

## 2014-02-27 NOTE — Progress Notes (Signed)
NURSING PROGRESS NOTE  Nicole Maxwell [id number] Discharge Data: 02/27/2014 10:37 AM Attending Provider: [name], MD [id number], [name], MD     Earlyne Iba to be D/C'd Home per MD order.  Discussed with the patient the After Visit Summary and all questions fully answered. All IV's discontinued with no bleeding noted. All belongings returned to patient for patient to take home.   Last Vital Signs:  Blood pressure 110/53, pulse 80, temperature 98.3 F (36.8 C), temperature source Oral, resp. rate 15, height  (1.626 m), weight 53.2 kg (117 lb 4.6 oz), SpO2 100 %.  Discharge Medication List   Medication List    STOP taking these medications        ondansetron 4 MG tablet  Commonly known as:  ZOFRAN      TAKE these medications        acetaminophen 500 MG tablet  Commonly known as:  TYLENOL  Take 500-1,000 mg by mouth every 8 (eight) hours as needed (for pain).     albuterol 108 (90 BASE) MCG/ACT inhaler  Commonly known as:  PROVENTIL HFA;VENTOLIN HFA  Inhale 2 puffs into the lungs every 4 (four) hours as needed for wheezing or shortness of breath.     albuterol (2.5 MG/3ML) 0.083% nebulizer solution  Commonly known as:  PROVENTIL  Take 3 mLs (2.5 mg total) by nebulization every 4 (four) hours as needed for wheezing or shortness of breath. (may take 1 every 4 hours as needed for wheezing/shortness of breath) DX: 496     aspirin 81 MG EC tablet  Take 1 tablet (81 mg total) by mouth daily.     benzonatate 100 MG capsule  Commonly known as:  TESSALON  Take 1 capsule (100 mg total) by mouth 3 (three) times daily as needed for cough.     budesonide-formoterol 160-4.5 MCG/ACT inhaler  Commonly known as:  SYMBICORT  Inhale 1 puff into the lungs 2 (two) times daily.     dextromethorphan-guaiFENesin 30-600 MG per 12 hr tablet  Commonly known as:  MUCINEX DM  Take 1 tablet by mouth 2 (two) times daily.     fluticasone 50 MCG/ACT nasal spray  Commonly known as:  FLONASE   Place 1 spray into both nostrils daily.     furosemide 40 MG tablet  Commonly known as:  LASIX  Take 1 tablet (40 mg total) by mouth daily.     HYDROcodone-acetaminophen 5-325 MG per tablet  Commonly known as:  NORCO/VICODIN  Take 1 tablet by mouth every 6 (six) hours as needed for pain.     HYDROcodone-homatropine 5-1.5 MG/5ML syrup  Commonly known as:  HYCODAN  Take 5 mLs by mouth every 6 (six) hours as needed for cough.     irbesartan 150 MG tablet  Commonly known as:  AVAPRO  Take 1 tablet (150 mg total) by mouth daily.     levofloxacin 500 MG tablet  Commonly known as:  LEVAQUIN  Take 1 tablet (500 mg total) by mouth daily.     metFORMIN 500 MG tablet  Commonly known as:  GLUCOPHAGE  Take 500 mg by mouth 2 (two) times daily with a meal.     pantoprazole 40 MG tablet  Commonly known as:  PROTONIX  Take 1 tablet (40 mg total) by mouth daily at 6 (six) AM.     predniSONE 10 MG tablet  Commonly known as:  DELTASONE  Take 4 pills daily for 3 days, then 2 pills daily for  3 days, then 1 pill daily for 3 days, then 1/2 pill daily thereafter.     tiotropium 18 MCG inhalation capsule  Commonly known as:  SPIRIVA  Place 1 capsule (18 mcg total) into inhaler and inhale daily.     varenicline 1 MG tablet  Commonly known as:  CHANTIX  Take 1 tablet (1 mg total) by mouth 2 (two) times daily.         Cathlyn Parsonsattha Kenard Morawski, RN

## 2014-02-27 NOTE — Discharge Summary (Signed)
DISCHARGE SUMMARY  Nicole Maxwell  MR#: [id number]  DOB:1945/11/24  Date of Admission: 02/21/2014 Date of Discharge: 02/27/2014  Attending Physician:Thurston Brendlinger, Nicole Maxwell  Patient's WYO:VZCH, Nicole Saxon, MD  Consults:  Speech/Physical Therapy  Discharge Diagnoses: Principal Problem:   HCAP (healthcare-associated pneumonia) Active Problems:   Severe chronic obstructive pulmonary disease, oxygen dependant with acute exacerbation   HTN (hypertension)   Diabetes mellitus type 2, noninsulin dependent   Chronic diastolic congestive heart failure   Dysphagia secondary to Esophageal dysmotility   Tobacco abuse  Past Medical History DM2 with vascular, neuro complications HTN Hyperlipidemia Anxiety d/o Chronic back pain Osteopenia COPD, severe- on O2 PVD- ABIs 0.86 Left (10/11) Nicotine addiction  Past Surgical History  Procedure Laterality Date  . Lumbar laminectomy/decompression microdiscectomy  2005    L4-5/notes 03/06/2010  . Appendectomy  1960  . Tonsillectomy  1960  . Lumbar fusion  2002  . Cholecystectomy    . Abdominal hysterectomy      "partial"  . Back surgery    . Carpal tunnel release Bilateral     CTS repair 2000 (R), 2006 (L)/notes 01/01/2014  . Cataract extraction w/ intraocular lens  implant, bilateral Bilateral 2015    Discharge Medications:   Medication List    STOP taking these medications        ondansetron 4 MG tablet  Commonly known as:  ZOFRAN      TAKE these medications        acetaminophen 500 MG tablet  Commonly known as:  TYLENOL  Take 500-1,000 mg by mouth every 8 (eight) hours as needed (for pain).     albuterol 108 (90 BASE) MCG/ACT inhaler  Commonly known as:  PROVENTIL HFA;VENTOLIN HFA  Inhale 2 puffs into the lungs every 4 (four) hours as needed for wheezing or shortness of breath.     albuterol (2.5 MG/3ML) 0.083% nebulizer solution  Commonly known as:  PROVENTIL  Take 3 mLs (2.5 mg total) by nebulization every 4 (four) hours as needed  for wheezing or shortness of breath. (may take 1 every 4 hours as needed for wheezing/shortness of breath) DX: 496     aspirin 81 MG EC tablet  Take 1 tablet (81 mg total) by mouth daily.     benzonatate 100 MG capsule  Commonly known as:  TESSALON  Take 1 capsule (100 mg total) by mouth 3 (three) times daily as needed for cough.     budesonide-formoterol 160-4.5 MCG/ACT inhaler  Commonly known as:  SYMBICORT  Inhale 1 puff into the lungs 2 (two) times daily.     dextromethorphan-guaiFENesin 30-600 MG per 12 hr tablet  Commonly known as:  MUCINEX DM  Take 1 tablet by mouth 2 (two) times daily.     fluticasone 50 MCG/ACT nasal spray  Commonly known as:  FLONASE  Place 1 spray into both nostrils daily.     furosemide 40 MG tablet  Commonly known as:  LASIX  Take 1 tablet (40 mg total) by mouth daily.     HYDROcodone-acetaminophen 5-325 MG per tablet  Commonly known as:  NORCO/VICODIN  Take 1 tablet by mouth every 6 (six) hours as needed for pain.     HYDROcodone-homatropine 5-1.5 MG/5ML syrup  Commonly known as:  HYCODAN  Take 5 mLs by mouth every 6 (six) hours as needed for cough.     irbesartan 150 MG tablet  Commonly known as:  AVAPRO  Take 1 tablet (150 mg total) by mouth daily.     levofloxacin 500  MG tablet  Commonly known as:  LEVAQUIN  Take 1 tablet (500 mg total) by mouth daily.     metFORMIN 500 MG tablet  Commonly known as:  GLUCOPHAGE  Take 500 mg by mouth 2 (two) times daily with a meal.     pantoprazole 40 MG tablet  Commonly known as:  PROTONIX  Take 1 tablet (40 mg total) by mouth daily at 6 (six) AM.     predniSONE 20 MG tablet  Commonly known as:  DELTASONE  Take 0.5 tablets (10 mg total) by mouth daily with breakfast. Take 4 pills daily for 3 days, then 2 pills daily for 3 days, then 1 pill daily for 3 days, then 1/2 pill daily thereafter.     tiotropium 18 MCG inhalation capsule  Commonly known as:  SPIRIVA  Place 1 capsule (18 mcg total) into  inhaler and inhale daily.     varenicline 1 MG tablet  Commonly known as:  CHANTIX  Take 1 tablet (1 mg total) by mouth 2 (two) times daily.        Hospital Procedures: Dg Chest 2 View  02/25/2014   CLINICAL DATA:  Pneumonia and shortness of breath  EXAM: CHEST  2 VIEW  COMPARISON:  PA and lateral chest x-ray of February 22, 2014  FINDINGS: The lungs remain hyperinflated. Patchy increased interstitial density persists at the left lung base but has improved. There are small bilateral pleural effusions which are stable. The heart and pulmonary vascularity are normal. The mediastinum is normal in width. The bony thorax is unremarkable.  IMPRESSION: COPD. Slight interval improvement in the appearance of the lingular pneumonia. There remain small bilateral pleural effusions.  Additional follow-up radiograph in 7-10 days are recommended to assure complete clearing.   Electronically Signed   By: David  Martinique   On: 02/25/2014 07:51   Dg Chest 2 View  02/22/2014   CLINICAL DATA:  Cough, congestion, shortness of breath common  EXAM: CHEST  2 VIEW  COMPARISON:  01/06/2014  FINDINGS: Ill-defined patchy nodular airspace process obscures the left cardiac border and is anterior on the lateral view compatible with lingula airspace disease. Appearance is compatible with pneumonia. There is a small associated left pleural effusion. Right lung remains clear. Stable mild hyperinflation. Negative for pneumothorax. Normal heart size and vascularity. Atherosclerosis noted of the aorta. Trachea is midline. Minor thoracic degenerative changes.  IMPRESSION: Lingula airspace process compatible with pneumonia and a small associated left effusion.  Recommend radiographic follow-up to document complete resolution.   Electronically Signed   By: Daryll Brod M.D.   On: 02/22/2014 10:50   Dg Esophagus  02/26/2014   CLINICAL DATA:  Food getting stuck in the esophagus. Occasional regurgitation. Persistent pneumonia, query aspiration.   EXAM: ESOPHOGRAM / BARIUM SWALLOW / BARIUM TABLET STUDY  TECHNIQUE: Combined double contrast and single contrast examination performed using effervescent crystals, thick barium liquid, and thin barium liquid. The patient was observed with fluoroscopy swallowing a 36m barium sulphate tablet.  FLUOROSCOPY TIME:  2 min, 30 seconds  COMPARISON:  02/25/2014  FINDINGS: During the pharyngeal phase of swallowing thick barium, there is laryngeal penetration to the level of the cords along with pooling/ stasis in the vallecula and piriform sinuses. The laryngeal penetration does not significantly clear when the patient clears her throat. The laryngeal penetration did not elicit a cough.  Faint suggestion a feline esophagus although in the upper thoracic esophagus which is an unusual location. At the level of the aortic arch  and below, there are secondary and tertiary contractions in the esophagus causing a bit of a corkscrew appearance on the double contrast image attempts and. On individual swallows, the secondary and tertiary contractions recurrent on all swallows, making it difficult to assess for stricture although most of the areas of narrowing due to secondary and tertiary contractions appear to be somewhat variable. There is 1 persistent area of anterior wall narrowing about at the level of the pulmonary veins shown on image 142 of series 6.  I was not able to distend the distal esophagus beyond about 7 mm. There is a small type 1 hiatal hernia.  Upon administration of a 13 mm barium tablet, the tablet initially impacted in the mid thoracic esophagus at about the level of the pulmonary arteries. It stayed therefore about a minute. With additional water administered aeration, the tablet slowly moved to the distal esophagus and subsequently to the distal most esophagus. After several minutes of intermittent observation the tablet was not observed to extend into the stomach.  IMPRESSION: 1. Assessment for stricture is  complicated by considerable secondary and tertiary contractions in the mid and distal esophagus. These contractions cause multifocal narrowings and a complex appearance. There is some narrowing in the mid to distal esophagus along the anterior wall, about at the vertical level of the pulmonary veins, which did not significantly resolved during imaging. Also, and the distal esophagus just above the hiatal hernia was never distended beyond about 7 mm. Overall appearance compatible with nonspecific esophageal dysmotility and considered indeterminate for smooth strictures due to the contractions. Given that the barium tablet impacted in the distal esophagus, endoscopy may be warranted for further characterization. 2. Laryngeal penetration with thick barium, to the level of the cords. This was associated with the pulling/stasis of contrast in the vallecula and piriform sinuses, and did not elicit a cough. The laryngeal penetration did not fully clear with throat clearing maneuver. A dedicated swallowing function assessment by speech pathology may provide helpful ancillary information.   Electronically Signed   By: Sherryl Barters M.D.   On: 02/26/2014 13:27    History of Present Illness: Nicole Maxwell is a 69 year old African American female with a history of severe COPD on home oxygen, ongoing tobacco abuse, and diabetes mellitus with vascular and neurologic competitions who presented to the office today with increased Cough and congestion. The patient was hospitalized from 12/8 through 12/16 for a COPD exacerbation. Her symptoms improved with prolonged steroid and antibiotic therapy. Since discharged she has continued on her prednisone 5 mg daily, Cipro Riva, and Symbicort. However, she states that she has continued to have increased congestion. Over the past several days she has had increased head and chest congestion with increased productive cough with yellow phlegm. She has had chills but no fever. She has had  intermittent diarrhea. Due to the symptoms, she called our office and was brought in for urgent evaluation. Chest x-ray shows new left lower lobe infiltrate. She'll be admitted for further management.her daughter does note that she has had some cough with eating recently. Question some dysphagia. Due to her poor baseline respiratory status and new left lower lobe pneumonia. She is being admitted for further management.  Hospital Course: Nicole Maxwell was admitted to a medical bed. She was treated with Vanco mycin and Zosyn for coverage of hospital-acquired causes of pneumonia. Her prednisone dose was increased to 60 mg daily for acute exacerbation of COPD in the setting of her pneumonia. She was continued on her Symbicort, Spiriva,  and scheduled albuterol. She remained on oxygen throughout her hospitalization. With these treatments, her symptoms gradually improved with significant improvement in dyspnea, wheezing, and cough. She continues to have a deep productive cough. Chest x-ray has shown gradual improvement in dense left lower lobe and lingular infiltrate consistent with resolving pneumonia. She's completed 6 days of IV antibiotics and will be discharged on an additional 7 days of oral Levaquin due to the severity of her pneumonia. Repeat chest x-ray will be performed as an outpatient to confirm resolution.  She did complain of some dysphagia symptoms. Speech therapy was evaluated and performed a modified barium swallow study that showed no signs of aspiration. An esophagram was performed that showed evidence of esophageal dysmotility. Consideration of outpatient EGD will be undertaken when her acute pulmonary issues resolve.  It is not felt that this contributed to her pneumonia.  At this time, the patient is stable for discharge home. She'll complete her oral antibiotics and he is Mucinex DM, Tessalon Perles, and Hycodan cough syrup as antitussives. She will continue her scheduled bronchodilators and taper  slowly down on her prednisone dose to 5 mg daily for maintenance. Arrangements are being made for home health physical therapy, nursing care for COPD management and medication assistance, and respiratory therapy. She will continue on her usual home oxygen 2 L continuous.  Day of Discharge Exam BP 110/53 mmHg  Pulse 80  Temp(Src) 98.3 F (36.8 C) (Oral)  Resp 15  Ht _0  (1.626 m)  Wt 53.2 kg (117 lb 4.6 oz)  BMI 20.12 kg/m2  SpO2 100%  Physical Exam: General appearance: alert and no distress Eyes: no scleral icterus Throat: oropharynx moist without erythema Resp: Minimal bilateral expiratory wheezing, improved; deep cough much less frequent Cardio: regular rate and rhythm GI: soft, non-tender; bowel sounds normal; no masses,  no organomegaly Extremities: no clubbing, cyanosis or edema  Discharge Labs:  Recent Labs  02/25/14 0556 02/26/14 0505  NA 142 140  K 3.6 3.5  CL 98 99  CO2 36* 32  GLUCOSE 113* 101*  BUN 16 19  CREATININE 0.87 0.82  CALCIUM 8.8 8.6     Recent Labs  02/25/14 0556  WBC 17.1*  HGB 10.2*  HCT 34.0*  MCV 85.4  PLT 275   Hepatic Function Latest Ref Rng 02/21/2014  Total Protein 6.0 - 8.3 g/dL 6.6  Albumin 3.5 - 5.2 g/dL 2.9(L)  AST 0 - 37 U/L 22  ALT 0 - 35 U/L 25  Alk Phosphatase 39 - 117 U/L 87  Total Bilirubin 0.3 - 1.2 mg/dL 0.3    Discharge instructions:     Discharge Instructions    Diet - low sodium heart healthy    Complete by:  As directed      Diet Carb Modified    Complete by:  As directed      Discharge instructions    Complete by:  As directed   Call if increased cough, shortness breath, increased discolored sputum, or fever above 101.5.     Increase activity slowly    Complete by:  As directed            Disposition: To home with home health  Follow-up Appts: Follow-up with Dr. Brigitte Pulse at [facility] within one week. Our office will contact her within 48 hours for transition of care visit within  the next week.  Condition on Discharge: Stable/improved  Tests Needing Follow-up: Repeat chest x-ray at follow-up visit  Time with discharge activities:  40 minutes  Signed: Marton Redwood 02/27/2014, 7:22 AM

## 2014-03-20 ENCOUNTER — Other Ambulatory Visit: Payer: Self-pay | Admitting: Internal Medicine

## 2014-03-20 DIAGNOSIS — J189 Pneumonia, unspecified organism: Secondary | ICD-10-CM

## 2014-03-22 ENCOUNTER — Ambulatory Visit
Admission: RE | Admit: 2014-03-22 | Discharge: 2014-03-22 | Disposition: A | Discharge status: Home / Self Care | Payer: Medicare Other | Source: Ambulatory Visit | Attending: Internal Medicine | Admitting: Internal Medicine

## 2014-03-22 DIAGNOSIS — J189 Pneumonia, unspecified organism: Secondary | ICD-10-CM

## 2014-03-22 MED ORDER — IOHEXOL 300 MG/ML  SOLN
75.0000 mL | Freq: Once | INTRAMUSCULAR | Status: AC | PRN
  Administered 2014-03-22: 75 mL via INTRAVENOUS

## 2014-04-16 ENCOUNTER — Observation Stay (HOSPITAL_COMMUNITY)
Admission: AD | Admit: 2014-04-16 | Discharge: 2014-04-18 | Disposition: A | Discharge status: Home / Self Care | Payer: Medicare Other | Source: Ambulatory Visit | Attending: Internal Medicine | Admitting: Internal Medicine

## 2014-04-16 DIAGNOSIS — R918 Other nonspecific abnormal finding of lung field: Secondary | ICD-10-CM | POA: Diagnosis present

## 2014-04-16 DIAGNOSIS — E119 Type 2 diabetes mellitus without complications: Secondary | ICD-10-CM | POA: Diagnosis not present

## 2014-04-16 DIAGNOSIS — Z8249 Family history of ischemic heart disease and other diseases of the circulatory system: Secondary | ICD-10-CM | POA: Diagnosis not present

## 2014-04-16 DIAGNOSIS — I5032 Chronic diastolic (congestive) heart failure: Secondary | ICD-10-CM | POA: Diagnosis not present

## 2014-04-16 DIAGNOSIS — D649 Anemia, unspecified: Secondary | ICD-10-CM | POA: Insufficient documentation

## 2014-04-16 DIAGNOSIS — Z7982 Long term (current) use of aspirin: Secondary | ICD-10-CM | POA: Insufficient documentation

## 2014-04-16 DIAGNOSIS — I10 Essential (primary) hypertension: Secondary | ICD-10-CM | POA: Diagnosis not present

## 2014-04-16 DIAGNOSIS — E559 Vitamin D deficiency, unspecified: Secondary | ICD-10-CM | POA: Diagnosis not present

## 2014-04-16 DIAGNOSIS — Z803 Family history of malignant neoplasm of breast: Secondary | ICD-10-CM | POA: Diagnosis not present

## 2014-04-16 DIAGNOSIS — F419 Anxiety disorder, unspecified: Secondary | ICD-10-CM | POA: Diagnosis not present

## 2014-04-16 DIAGNOSIS — J449 Chronic obstructive pulmonary disease, unspecified: Principal | ICD-10-CM

## 2014-04-16 DIAGNOSIS — F17210 Nicotine dependence, cigarettes, uncomplicated: Secondary | ICD-10-CM | POA: Diagnosis not present

## 2014-04-16 DIAGNOSIS — J45909 Unspecified asthma, uncomplicated: Secondary | ICD-10-CM | POA: Insufficient documentation

## 2014-04-16 DIAGNOSIS — I739 Peripheral vascular disease, unspecified: Secondary | ICD-10-CM | POA: Insufficient documentation

## 2014-04-16 DIAGNOSIS — E785 Hyperlipidemia, unspecified: Secondary | ICD-10-CM | POA: Diagnosis not present

## 2014-04-16 DIAGNOSIS — J9611 Chronic respiratory failure with hypoxia: Secondary | ICD-10-CM

## 2014-04-16 DIAGNOSIS — J9610 Chronic respiratory failure, unspecified whether with hypoxia or hypercapnia: Secondary | ICD-10-CM | POA: Diagnosis present

## 2014-04-16 DIAGNOSIS — R05 Cough: Secondary | ICD-10-CM | POA: Diagnosis present

## 2014-04-16 HISTORY — DX: Pneumonia, unspecified organism: J18.9

## 2014-04-16 LAB — CBC WITH DIFFERENTIAL/PLATELET
Basophils Absolute: 0 10*3/uL (ref 0.0–0.1)
Basophils Relative: 0 % (ref 0–1)
Eosinophils Absolute: 0.1 10*3/uL (ref 0.0–0.7)
Eosinophils Relative: 1 % (ref 0–5)
HEMATOCRIT: 33.1 % — AB (ref 36.0–46.0)
HEMOGLOBIN: 10 g/dL — AB (ref 12.0–15.0)
LYMPHS PCT: 17 % (ref 12–46)
Lymphs Abs: 2.1 10*3/uL (ref 0.7–4.0)
MCH: 25.9 pg — ABNORMAL LOW (ref 26.0–34.0)
MCHC: 30.2 g/dL (ref 30.0–36.0)
MCV: 85.8 fL (ref 78.0–100.0)
MONOS PCT: 5 % (ref 3–12)
Monocytes Absolute: 0.7 10*3/uL (ref 0.1–1.0)
NEUTROS ABS: 9.3 10*3/uL — AB (ref 1.7–7.7)
NEUTROS PCT: 77 % (ref 43–77)
Platelets: 256 10*3/uL (ref 150–400)
RBC: 3.86 MIL/uL — AB (ref 3.87–5.11)
RDW: 14.5 % (ref 11.5–15.5)
WBC: 12.2 10*3/uL — AB (ref 4.0–10.5)

## 2014-04-16 LAB — COMPREHENSIVE METABOLIC PANEL
ALBUMIN: 3.1 g/dL — AB (ref 3.5–5.2)
ALT: 9 U/L (ref 0–35)
AST: 16 U/L (ref 0–37)
Alkaline Phosphatase: 56 U/L (ref 39–117)
Anion gap: 9 (ref 5–15)
BUN: 13 mg/dL (ref 6–23)
CO2: 29 mmol/L (ref 19–32)
Calcium: 8.8 mg/dL (ref 8.4–10.5)
Chloride: 104 mmol/L (ref 96–112)
Creatinine, Ser: 0.89 mg/dL (ref 0.50–1.10)
GFR calc Af Amer: 75 mL/min — ABNORMAL LOW (ref >90)
GFR calc non Af Amer: 65 mL/min — ABNORMAL LOW (ref >90)
GLUCOSE: 134 mg/dL — AB (ref 70–99)
Potassium: 3.8 mmol/L (ref 3.5–5.1)
Sodium: 142 mmol/L (ref 135–145)
Total Bilirubin: 0.4 mg/dL (ref 0.3–1.2)
Total Protein: 6.1 g/dL (ref 6.0–8.3)

## 2014-04-16 LAB — GLUCOSE, CAPILLARY: Glucose-Capillary: 83 mg/dL (ref 70–99)

## 2014-04-16 MED ORDER — ALUM & MAG HYDROXIDE-SIMETH 200-200-20 MG/5ML PO SUSP: 30.0000 mL | Freq: Four times a day (QID) | ORAL | Status: DC | PRN

## 2014-04-16 MED ORDER — HYDROMORPHONE HCL 1 MG/ML IJ SOLN: 0.5000 mg | INTRAMUSCULAR | Status: DC | PRN

## 2014-04-16 MED ORDER — TIOTROPIUM BROMIDE MONOHYDRATE 18 MCG IN CAPS
18.0000 ug | ORAL_CAPSULE | Freq: Every day | RESPIRATORY_TRACT | Status: DC
  Administered 2014-04-17 – 2014-04-18 (×2): 18 ug via RESPIRATORY_TRACT
  Filled 2014-04-16: qty 5

## 2014-04-16 MED ORDER — ALPRAZOLAM 0.5 MG PO TABS: 1.0000 mg | ORAL_TABLET | Freq: Three times a day (TID) | ORAL | Status: DC | PRN

## 2014-04-16 MED ORDER — ALBUTEROL SULFATE (2.5 MG/3ML) 0.083% IN NEBU
2.5000 mg | INHALATION_SOLUTION | RESPIRATORY_TRACT | Status: DC | PRN
  Administered 2014-04-17: 2.5 mg via RESPIRATORY_TRACT
  Filled 2014-04-16 (×2): qty 3

## 2014-04-16 MED ORDER — INSULIN ASPART 100 UNIT/ML ~~LOC~~ SOLN
0.0000 [IU] | SUBCUTANEOUS | Status: DC
  Administered 2014-04-17: 3 [IU] via SUBCUTANEOUS
  Administered 2014-04-17: 1 [IU] via SUBCUTANEOUS
  Administered 2014-04-17: 2 [IU] via SUBCUTANEOUS
  Administered 2014-04-18: 3 [IU] via SUBCUTANEOUS

## 2014-04-16 MED ORDER — SODIUM CHLORIDE 0.9 % IJ SOLN
3.0000 mL | Freq: Two times a day (BID) | INTRAMUSCULAR | Status: DC
  Administered 2014-04-16 – 2014-04-18 (×3): 3 mL via INTRAVENOUS

## 2014-04-16 MED ORDER — ACETAMINOPHEN 325 MG PO TABS: 650.0000 mg | ORAL_TABLET | Freq: Four times a day (QID) | ORAL | Status: DC | PRN

## 2014-04-16 MED ORDER — OXYCODONE HCL 5 MG PO TABS: 5.0000 mg | ORAL_TABLET | ORAL | Status: DC | PRN

## 2014-04-16 MED ORDER — IRBESARTAN 150 MG PO TABS
150.0000 mg | ORAL_TABLET | Freq: Every day | ORAL | Status: DC
  Administered 2014-04-16 – 2014-04-18 (×3): 150 mg via ORAL
  Filled 2014-04-16 (×3): qty 1

## 2014-04-16 MED ORDER — ONDANSETRON HCL 4 MG/2ML IJ SOLN: 4.0000 mg | Freq: Four times a day (QID) | INTRAMUSCULAR | Status: DC | PRN

## 2014-04-16 MED ORDER — ONDANSETRON HCL 4 MG PO TABS: 4.0000 mg | ORAL_TABLET | Freq: Four times a day (QID) | ORAL | Status: DC | PRN

## 2014-04-16 MED ORDER — SODIUM CHLORIDE 0.9 % IV SOLN: 250.0000 mL | INTRAVENOUS | Status: DC | PRN

## 2014-04-16 MED ORDER — ACETAMINOPHEN 650 MG RE SUPP: 650.0000 mg | Freq: Four times a day (QID) | RECTAL | Status: DC | PRN

## 2014-04-16 MED ORDER — PANTOPRAZOLE SODIUM 40 MG PO TBEC: 40.0000 mg | DELAYED_RELEASE_TABLET | Freq: Every day | ORAL | Status: DC

## 2014-04-16 MED ORDER — FUROSEMIDE 40 MG PO TABS
40.0000 mg | ORAL_TABLET | Freq: Every day | ORAL | Status: DC
  Administered 2014-04-16 – 2014-04-18 (×3): 40 mg via ORAL
  Filled 2014-04-16 (×3): qty 1

## 2014-04-16 MED ORDER — SODIUM CHLORIDE 0.9 % IJ SOLN: 3.0000 mL | INTRAMUSCULAR | Status: DC | PRN

## 2014-04-16 NOTE — H&P (Signed)
[facility] Admission History and Physical       Nicole Ibalta S Briceno ZOX:[id number] DOB: [birth year] DOA: 04/16/2014  Referring physician: EDP PCP: [name], William, MD  Specialists:   Chief Complaint: Cough  HPI: Nicole Maxwell is a 69 y.o. female with a history of O2 Dependent COPD who was sent by her PCP Dr Clelia CroftShaw for Direct admission to the ED due to a recurring LLL infiltrate.   She had labs which also revealed that her WBCs were trending upward from 11.9 a week ago to 17.1.   She is afebrile, but has a productive cough.     Of Note she was admitted 02/03 for LLL Pneumonia and was treated with IV Vancomycin and Cefepime and on discharge she was treated for 2 additional weeks with Levaquin.   Her PCP performed a repeat chest x-ray in the interim,which revealed some clearing, and then a ct scan of the chest on 02/26 which revealed resolution of the infectious process with lingular Atelectasis.  And this week a chest x-ray was performed and revealed recurrence of the LLL infiltrate.      Review of Systems:  Constitutional: +Weight Loss, No Weight Gain, Night Sweats, Fevers, Chills, Dizziness, Light Headedness, Fatigue, or Generalized Weakness HEENT: No Headaches, Difficulty Swallowing,Tooth/Dental Problems,Sore Throat,  No Sneezing, Rhinitis, Ear Ache, Nasal Congestion, or Post Nasal Drip,  Cardio-vascular:  No Chest pain, Orthopnea, PND, Edema in Lower Extremities, Anasarca, Dizziness, Palpitations  Resp: No +Dyspnea, +DOE, +Productive Cough, No Non-Productive Cough, No Hemoptysis, No Wheezing.    GI: No Heartburn, Indigestion, Abdominal Pain, Nausea, Vomiting, Diarrhea, Constipation, Hematemesis, Hematochezia, Melena, Change in Bowel Habits,  Loss of Appetite  GU: No Dysuria, No Change in Color of Urine, No Urgency or Urinary Frequency, No Flank pain.  Musculoskeletal: No Joint Pain or Swelling, No Decreased Range of Motion, No Back Pain.  Neurologic: No Syncope, No Seizures, Muscle  Weakness, Paresthesia, Vision Disturbance or Loss, No Diplopia, No Vertigo, No Difficulty Walking,  Skin: No Rash or Lesions. Psych: No Change in Mood or Affect, No Depression or Anxiety, No Memory loss, No Confusion, or Hallucinations   Past Medical History  Diagnosis Date  . Hypertension   . Chronic back pain     "all over"  . Osteopenia   . PVD (peripheral vascular disease)   . Tobacco abuse     " I am addicted"   . Vitamin D deficiency disease     03/26/2010 - 9.9ng/mL. Started on replacemnt  . Alcohol abuse, in remission quit 2004  . COPD (chronic obstructive pulmonary disease)     cxr 04/03/2010 - hyperinflation  . Neuromuscular disorder     lumbar disc  . Anxiety     Xanax prn  . Shortness of breath     with anxiety  and activty  . Asthma   . Hyperlipidemia   . On home oxygen therapy     "2L; suppose to be 24/7" (01/01/2014)  . Type II diabetes mellitus   . Arthritis     "my whole body"     Past Surgical History  Procedure Laterality Date  . Lumbar laminectomy/decompression microdiscectomy  2005    L4-5/notes 03/06/2010  . Appendectomy  1960  . Tonsillectomy  1960  . Lumbar fusion  2002  . Cholecystectomy      PT NOT SURE  . Abdominal hysterectomy      "partial"  . Back surgery    . Carpal tunnel release Bilateral     CTS  repair 2000 (R), 2006 (L)/notes 01/01/2014  . Cataract extraction w/ intraocular lens  implant, bilateral Bilateral 2015      Prior to Admission medications   Medication Sig Start Date End Date Taking? Authorizing Provider  acetaminophen (TYLENOL) 500 MG tablet Take 500-1,000 mg by mouth every 8 (eight) hours as needed (for pain).   Yes Historical Provider, MD  albuterol (PROVENTIL HFA;VENTOLIN HFA) 108 (90 BASE) MCG/ACT inhaler Inhale 2 puffs into the lungs every 4 (four) hours as needed for wheezing or shortness of breath.    Yes Historical Provider, MD  albuterol (PROVENTIL) (2.5 MG/3ML) 0.083% nebulizer solution Take 3 mLs (2.5 mg total)  by nebulization every 4 (four) hours as needed for wheezing or shortness of breath. (may take 1 every 4 hours as needed for wheezing/shortness of breath) DX: 496 01/09/14  Yes [name], MD  alprazolam Prudy Feeler) 2 MG tablet Take 1-2 mg by mouth 3 (three) times daily as needed for anxiety.   Yes Historical Provider, MD  aspirin EC 81 MG EC tablet Take 1 tablet (81 mg total) by mouth daily. 01/09/14  Yes [name], MD  budesonide-formoterol [facility]) 160-4.5 MCG/ACT inhaler Inhale 1 puff into the lungs 2 (two) times daily.    Yes Historical Provider, MD  dextromethorphan-guaiFENesin (MUCINEX DM) 30-600 MG per 12 hr tablet Take 1 tablet by mouth 2 (two) times daily. 01/09/14  Yes [name], MD  fluticasone (FLONASE) 50 MCG/ACT nasal spray Place 1 spray into both nostrils daily.   Yes Historical Provider, MD  furosemide (LASIX) 40 MG tablet Take 1 tablet (40 mg total) by mouth daily. 01/09/14  Yes [name], MD  HYDROcodone-acetaminophen (NORCO/VICODIN) 5-325 MG per tablet Take 1 tablet by mouth every 6 (six) hours as needed for pain.    Yes Historical Provider, MD  HYDROcodone-homatropine (HYCODAN) 5-1.5 MG/5ML syrup Take 5 mLs by mouth every 6 (six) hours as needed for cough. 02/27/14  Yes [name], MD  irbesartan (AVAPRO) 150 MG tablet Take 1 tablet (150 mg total) by mouth daily. 01/09/14  Yes [name], MD  metFORMIN (GLUCOPHAGE) 500 MG tablet Take 500 mg by mouth 2 (two) times daily with a meal.     Yes Historical Provider, MD  pantoprazole (PROTONIX) 40 MG tablet Take 1 tablet (40 mg total) by mouth daily at 6 (six) AM. 01/09/14  Yes [name], MD  tiotropium (SPIRIVA) 18 MCG inhalation capsule Place 1 capsule (18 mcg total) into inhaler and inhale daily. 04/09/12  Yes Jarome Matin, MD  varenicline (CHANTIX) 1 MG tablet Take 1 tablet (1 mg total) by mouth 2 (two) times daily. 02/27/14  Yes [name], MD  benzonatate (TESSALON) 100 MG capsule Take 1 capsule (100 mg total) by  mouth 3 (three) times daily as needed for cough. Patient not taking: Reported on 04/16/2014 02/27/14   [name], MD  levofloxacin (LEVAQUIN) 500 MG tablet Take 1 tablet (500 mg total) by mouth daily. Patient not taking: Reported on 04/16/2014 02/27/14   [name], MD  predniSONE (DELTASONE) 10 MG tablet Take 4 pills daily for 3 days, then 2 pills daily for 3 days, then 1 pill daily for 3 days, then 1/2 pill daily thereafter. Patient not taking: Reported on 04/16/2014 02/27/14   [name], MD     Allergies  Allergen Reactions  . Brovana [Arformoterol Tartrate] Shortness Of Breath and Cough    General intolerance  . Budesonide Shortness Of Breath and Cough    General intolerance  Social History:  reports that she has been smoking Cigarettes.  She has a 28 pack-year smoking history. She has never used smokeless tobacco. She reports that she drinks alcohol. She reports that she does not use illicit drugs.    Family History  Problem Relation Age of Onset  . Heart attack Sister 68    CABG  . Breast cancer Sister   . Cancer Brother   . Heart attack Brother   . Hypertension Other     all siblings  . Anesthesia problems Neg Hx        Physical Exam:  GEN:  Pleasant  Cachectic  69 y.o.  African American  female examined and in no acute distress; cooperative with exam Filed Vitals:   04/16/14 2012  BP: 120/60  Pulse: 100  Temp: 98.8 F (37.1 C)  TempSrc: Oral  Resp: 24  Height: 5\' 4"  (1.626 m)  SpO2: 94%   Blood pressure 120/60, pulse 100, temperature 98.8 F (37.1 C), temperature source Oral, resp. rate 24, height 5\' 4"  (1.626 m), SpO2 94 %. PSYCH: She is alert and oriented x4; does not appear anxious does not appear depressed; affect is normal HEENT: Normocephalic and Atraumatic, Mucous membranes pink; PERRLA; EOM intact; Fundi:  Benign;  No scleral icterus, Nares: Patent, Oropharynx: Clear, Fair Dentition,    Neck:  FROM, No Cervical Lymphadenopathy nor Thyromegaly or  Carotid Bruit; No JVD; Breasts:: Not examined CHEST WALL: No tenderness CHEST: Normal respiration, clear to auscultation bilaterally HEART: Regular rate and rhythm; no murmurs rubs or gallops BACK: No kyphosis or scoliosis; No CVA tenderness ABDOMEN: Positive Bowel Sounds, Scaphoid, Soft Non-Tender, No Rebound or Guarding; No Masses, No Organomegaly Rectal Exam: Not done EXTREMITIES: No Cyanosis, Clubbing, or Edema; No Ulcerations. Genitalia: not examined PULSES: 2+ and symmetric SKIN: Normal hydration no rash or ulceration CNS:  Alert and Oriented x 4, No Focal Deficits Vascular: pulses palpable throughout    Labs on Admission:  Basic Metabolic Panel: No results for input(s): NA, K, CL, CO2, GLUCOSE, BUN, CREATININE, CALCIUM, MG, PHOS in the last 168 hours. Liver Function Tests: No results for input(s): AST, ALT, ALKPHOS, BILITOT, PROT, ALBUMIN in the last 168 hours. No results for input(s): LIPASE, AMYLASE in the last 168 hours. No results for input(s): AMMONIA in the last 168 hours. CBC: No results for input(s): WBC, NEUTROABS, HGB, HCT, MCV, PLT in the last 168 hours. Cardiac Enzymes: No results for input(s): CKTOTAL, CKMB, CKMBINDEX, TROPONINI in the last 168 hours.  BNP (last 3 results) No results for input(s): BNP in the last 8760 hours.  ProBNP (last 3 results)  Recent Labs  01/01/14 1213 01/03/14 0850  PROBNP 364.2* 724.5*    CBG: No results for input(s): GLUCAP in the last 168 hours.  Radiological Exams on Admission: No results found.      Assessment/Plan:   69 y.o. female with   Active Problems:   1.    Pulmonary infiltrate in left lung on chest x-ray   Repeat Chest CXR   Pulmonary Consulted for AM for consideration for possible     Bronchoscopy    No Antibiotics Prior to Bronchoscopy unless     condition changes     2.    Severe chronic obstructive pulmonary disease   Continue Albuterol Nebs   Continue Spiriva    Note Pt Allergies    3.     Chronic respiratory failure   On Continuous [place] o2 at 2 liters   Monitor O2 sats  4.    HTN (hypertension)   Continue  Avapro Rx   Monitor BPs      5.    Hyperlipidemia   Not currently on Rx   Check Fasting Lipids     6.    DM type 2 (diabetes mellitus, type 2)   Hold Metformin Rx   SSI coverage PRN   Check HbA1C in AM      7.   DVT Prophylaxis    SCDs        8.   Other - Send CBC with Diff , and CMet.      Code Status:     FULL CODE        Family Communication:   Family at Bedside     Disposition Plan:     Observation Status        Time spent:  45 Minutes      Ron Parker [facility] Pager [phone]   If 7AM -7PM Please Contact the Day Rounding Team MD for [facility]  If 7PM-7AM, Please Contact Night-Floor Coverage  [email or web address] Password [facility] 04/16/2014, 9:21 PM     ADDENDUM:   Patient was seen and examined on 04/16/2014

## 2014-04-17 ENCOUNTER — Encounter (HOSPITAL_COMMUNITY): Payer: Self-pay | Admitting: General Practice

## 2014-04-17 ENCOUNTER — Observation Stay (HOSPITAL_COMMUNITY): Payer: Medicare Other

## 2014-04-17 DIAGNOSIS — R918 Other nonspecific abnormal finding of lung field: Secondary | ICD-10-CM | POA: Diagnosis not present

## 2014-04-17 DIAGNOSIS — T8182XA Emphysema (subcutaneous) resulting from a procedure, initial encounter: Secondary | ICD-10-CM | POA: Diagnosis not present

## 2014-04-17 DIAGNOSIS — J441 Chronic obstructive pulmonary disease with (acute) exacerbation: Secondary | ICD-10-CM

## 2014-04-17 DIAGNOSIS — J449 Chronic obstructive pulmonary disease, unspecified: Secondary | ICD-10-CM | POA: Diagnosis not present

## 2014-04-17 DIAGNOSIS — J411 Mucopurulent chronic bronchitis: Secondary | ICD-10-CM | POA: Diagnosis not present

## 2014-04-17 DIAGNOSIS — J9611 Chronic respiratory failure with hypoxia: Secondary | ICD-10-CM | POA: Diagnosis not present

## 2014-04-17 DIAGNOSIS — Z720 Tobacco use: Secondary | ICD-10-CM

## 2014-04-17 LAB — CBC
HCT: 33.1 % — ABNORMAL LOW (ref 36.0–46.0)
Hemoglobin: 10 g/dL — ABNORMAL LOW (ref 12.0–15.0)
MCH: 26.1 pg (ref 26.0–34.0)
MCHC: 30.2 g/dL (ref 30.0–36.0)
MCV: 86.4 fL (ref 78.0–100.0)
PLATELETS: 254 10*3/uL (ref 150–400)
RBC: 3.83 MIL/uL — AB (ref 3.87–5.11)
RDW: 14.7 % (ref 11.5–15.5)
WBC: 13 10*3/uL — AB (ref 4.0–10.5)

## 2014-04-17 LAB — GLUCOSE, CAPILLARY
GLUCOSE-CAPILLARY: 104 mg/dL — AB (ref 70–99)
GLUCOSE-CAPILLARY: 121 mg/dL — AB (ref 70–99)
GLUCOSE-CAPILLARY: 98 mg/dL (ref 70–99)
Glucose-Capillary: 108 mg/dL — ABNORMAL HIGH (ref 70–99)
Glucose-Capillary: 192 mg/dL — ABNORMAL HIGH (ref 70–99)
Glucose-Capillary: 217 mg/dL — ABNORMAL HIGH (ref 70–99)

## 2014-04-17 LAB — BASIC METABOLIC PANEL
Anion gap: 4 — ABNORMAL LOW (ref 5–15)
BUN: 12 mg/dL (ref 6–23)
CALCIUM: 8.7 mg/dL (ref 8.4–10.5)
CO2: 37 mmol/L — ABNORMAL HIGH (ref 19–32)
Chloride: 102 mmol/L (ref 96–112)
Creatinine, Ser: 0.75 mg/dL (ref 0.50–1.10)
GFR calc Af Amer: >90 mL/min (ref >90)
GFR, EST NON AFRICAN AMERICAN: 84 mL/min — AB (ref >90)
GLUCOSE: 101 mg/dL — AB (ref 70–99)
POTASSIUM: 3.8 mmol/L (ref 3.5–5.1)
Sodium: 143 mmol/L (ref 135–145)

## 2014-04-17 MED ORDER — PREDNISONE 5 MG PO TABS
5.0000 mg | ORAL_TABLET | Freq: Every day | ORAL | Status: DC
  Administered 2014-04-17: 5 mg via ORAL
  Filled 2014-04-17 (×3): qty 1

## 2014-04-17 MED ORDER — ALBUTEROL SULFATE (2.5 MG/3ML) 0.083% IN NEBU
2.5000 mg | INHALATION_SOLUTION | RESPIRATORY_TRACT | Status: DC
  Administered 2014-04-17 (×4): 2.5 mg via RESPIRATORY_TRACT
  Filled 2014-04-17 (×4): qty 3

## 2014-04-17 MED ORDER — ALBUTEROL SULFATE (2.5 MG/3ML) 0.083% IN NEBU
2.5000 mg | INHALATION_SOLUTION | Freq: Four times a day (QID) | RESPIRATORY_TRACT | Status: DC
  Administered 2014-04-18 (×3): 2.5 mg via RESPIRATORY_TRACT
  Filled 2014-04-17 (×3): qty 3

## 2014-04-17 MED ORDER — PREDNISONE 50 MG PO TABS
60.0000 mg | ORAL_TABLET | Freq: Every day | ORAL | Status: DC
  Administered 2014-04-18: 60 mg via ORAL
  Filled 2014-04-17 (×2): qty 1

## 2014-04-17 MED ORDER — DOXYCYCLINE HYCLATE 100 MG PO TABS
100.0000 mg | ORAL_TABLET | Freq: Two times a day (BID) | ORAL | Status: DC
  Administered 2014-04-17 – 2014-04-18 (×3): 100 mg via ORAL
  Filled 2014-04-17 (×4): qty 1

## 2014-04-17 MED ORDER — BUDESONIDE-FORMOTEROL FUMARATE 160-4.5 MCG/ACT IN AERO
2.0000 | INHALATION_SPRAY | Freq: Two times a day (BID) | RESPIRATORY_TRACT | Status: DC
  Administered 2014-04-17 – 2014-04-18 (×2): 2 via RESPIRATORY_TRACT
  Filled 2014-04-17: qty 6

## 2014-04-17 MED ORDER — PANTOPRAZOLE SODIUM 40 MG PO TBEC
40.0000 mg | DELAYED_RELEASE_TABLET | Freq: Every day | ORAL | Status: DC
  Administered 2014-04-17 – 2014-04-18 (×2): 40 mg via ORAL
  Filled 2014-04-17 (×2): qty 1

## 2014-04-17 NOTE — Progress Notes (Signed)
UR completed 

## 2014-04-17 NOTE — Evaluation (Signed)
Clinical/Bedside Swallow Evaluation Patient Details  Name: Nicole Maxwell MRN: [id number] Date of Birth: 1945/12/01  Today's Date: 04/17/2014 Time: SLP Start Time (ACUTE ONLY): 1430 SLP Stop Time (ACUTE ONLY): 1445 SLP Time Calculation (min) (ACUTE ONLY): 15 min  Past Medical History:  Past Medical History  Diagnosis Date  . Hypertension   . Chronic back pain     "goes from the lower part of my back on down my legs"  (04/17/2014)  . Osteopenia   . PVD (peripheral vascular disease)   . Tobacco abuse     " I am addicted"   . Vitamin D deficiency disease     03/26/2010 - 9.9ng/mL. Started on replacemnt  . Alcohol abuse, in remission quit 2004  . Neuromuscular disorder     lumbar disc  . Anxiety     Xanax prn  . Shortness of breath     with anxiety  and activty  . Asthma   . Hyperlipidemia   . On home oxygen therapy     "2L; suppose to be 24/7" (04/17/2014)  . COPD (chronic obstructive pulmonary disease)     cxr 04/03/2010 - hyperinflation  . Pneumonia     "3 times in the last month or so" (04/17/2014)  . Type II diabetes mellitus   . Arthritis     "my whole body"   Past Surgical History:  Past Surgical History  Procedure Laterality Date  . Lumbar laminectomy/decompression microdiscectomy  2005    L4-5/notes 03/06/2010  . Appendectomy  1960  . Tonsillectomy  1960  . Posterior lumbar fusion  2002  . Abdominal hysterectomy      "partial"  . Back surgery    . Carpal tunnel release Bilateral     CTS repair 2000 (R), 2006 (L)/notes 01/01/2014  . Cataract extraction w/ intraocular lens  implant, bilateral Bilateral 2015   HPI:  Nicole Maxwell is a 69 year old female with a history of O2 dependent COPD who was sent by her PCP Dr Clelia Croft for Direct admission to the ED due to a recurring LLL infiltrate.  Of note, she was admitted 02/03 for LLL Pneumonia and was treated with IV Vancomycin and Cefepime and on discharge she was treated for 2 additional weeks with Levaquin.   Her PCP performed a  repeat chest x-ray in the interim,which revealed some clearing, and then a ct scan of the chest on 02/26 which revealed resolution of the infectious process with lingular Atelectasis.  And this week a chest x-ray was performed and revealed recurrence of the LLL infiltrate.  Additionally, patient is known to this service from her previous admissions with MBS completed 02/22/14 and Bedside/reeducation on 02/26/14.     Assessment / Plan / Recommendation Clinical Impression  Patient presents with an unremarkable oral motor exam.  Congested cough at baseline and throughout bedside swallow evaluation. Thin liquids via straw resulted in a consistent delayed cough response and which was minimized with removal of straw.  SLP reviewed previous MBS and DG Esophagus results (1. Esophageal dysmotility, 2. Recommendation for follow up endoscopy, 3. Deep silent flash penetration with most liquids) with patient, who could recall test took place but was unable to recall recommendations.  Patient reports eating and drinking reclined in bed and not remembering to do things she needs to.  SLP re-educated patient regarding recommendations to minimize primary esophageal based dysphagia risks and in an attempt to facilitate carryover wrote things on handout.  Patient stated that she needed help at  home and to remember things but there was no family present to educate today.  SLP will follow up briefly x1 for education and carryover.  Also recommend MD [facility].  Given source of dysphagia, COPD and poor patient carryover aspiration risk remains moderate.     Aspiration Risk  Moderate    Diet Recommendation Regular;Thin liquid (No tough, dry food)   Liquid Administration via: Cup;No straw Medication Administration: Whole meds with liquid Supervision: Patient able to self feed;Intermittent supervision to cue for compensatory strategies Compensations: Slow rate;Small  sips/bites;Follow solids with liquid Postural Changes and/or Swallow Maneuvers: Out of bed for meals;Seated upright 90 degrees;Upright 30-60 min after meal    Other  Recommendations Recommended Consults: Other (Comment);Consider GI evaluation (02/26/14 DG Esophagus recommended Endoscopy) Oral Care Recommendations: Oral care BID   Follow Up Recommendations  Other (comment) (Intermittent supervision to assist with recall and carryover )    Frequency and Duration min 1 x/week  1 week   Pertinent Vitals/Pain none    SLP Swallow Goals  see care plan of details    Swallow Study Prior Functional Status   regular and thin per patient report     General HPI: Earlyne Ibalta S Maxwell is a 69 year old female with a history of O2 dependent COPD who was sent by her PCP Dr Clelia CroftShaw for Direct admission to the ED due to a recurring LLL infiltrate.  Of note, she was admitted 02/03 for LLL Pneumonia and was treated with IV Vancomycin and Cefepime and on discharge she was treated for 2 additional weeks with Levaquin.   Her PCP performed a repeat chest x-ray in the interim,which revealed some clearing, and then a ct scan of the chest on 02/26 which revealed resolution of the infectious process with lingular Atelectasis.  And this week a chest x-ray was performed and revealed recurrence of the LLL infiltrate.  Additionally, patient is known to this service from her previous admissions with MBS completed 02/22/14 and Bedside/reeducation on 02/26/14.   Type of Study: Bedside swallow evaluation Previous Swallow Assessment: 02/22/14 BSE, MBS; 02/26/14 BSE, reeducation, DG Esophagus   Diet Prior to this Study: Regular;Thin liquids Temperature Spikes Noted: No Respiratory Status: Nasal cannula History of Recent Intubation: No Behavior/Cognition: Alert;Cooperative;Pleasant mood;Requires cueing;Other (comment) (difficulty recalling previous education ) Oral Cavity - Dentition: Adequate natural dentition Self-Feeding Abilities: Able to  feed self Patient Positioning: Upright in bed (cued to sit up 90 degrees) Baseline Vocal Quality: Hoarse Volitional Cough: Strong;Congested Volitional Swallow: Able to elicit    Oral/Motor/Sensory Function Overall Oral Motor/Sensory Function: Appears within functional limits for tasks assessed   Ice Chips Ice chips: Not tested   Thin Liquid Thin Liquid: Impaired Presentation: Cup;Straw Pharyngeal  Phase Impairments: Cough - Delayed Other Comments: intermittent coughs present at baseline; patient with consistent delayed coughs following straw sips; intermittent coughs with small sips via cup    Nectar Thick Nectar Thick Liquid: Not tested   Honey Thick Honey Thick Liquid: Not tested   Puree Puree: Impaired Pharyngeal Phase Impairments: Cough - Delayed Other Comments: intermittent cough present at baseline and throughout    Solid   GO Functional Assessment Tool Used: skilled clinical judgement  Functional Limitations: Swallowing Swallow Current Status ([id number]): At least 60 percent but less than 80 percent impaired, limited or restricted Swallow Goal Status [phone]): At least 60 percent but less than 80 percent impaired, limited or restricted  Solid: Impaired Other Comments: intermittent coughs present at baseline and  throughout       Charlane Ferretti., CCC-SLP [phone]  Nalee Lightle 04/17/2014,3:34 PM

## 2014-04-17 NOTE — Consult Note (Addendum)
Name: Nicole Maxwell MRN: [id number] DOB: 05/09/1945    ADMISSION DATE:  04/16/2014 CONSULTATION DATE:  04/17/2014  REFERRING MD :  Dr. 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   SIGNIFICANT EVENTS  01/2011 > Spirometry - fev1 0.74L/37%, RAtop 44 - shows gold stage 3 copd 1/28 > 2/3 Admission to [facility] for LLL HCAP and acute exacerbation of COPD 2/2 Barium swallow > assessment for stricture complicated, however evidence of aspriation 3/22 > persistent cough, PCP repeated CXR which shows recurrent LLL infiltrate   STUDIES:  2/26 CT chest > Chronic lung disease with emphysema. Largely resolved recent lingula infectious exacerbation, with mild residual lingula atelectasis and peribronchial thickening. Extensive aortic atherosclerosis. Evidence of coronary artery calcified plaque. 3/22 CXR admission > Residual scarring and/or component of residual infiltrate remaining in the lingula. This is the region of prior pneumonia and there may be a new interval infiltrate present in the same location.   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 She was recently admitted to [facility] 1/28-2/3 for AECOPD and LLL HCAP (admited 12/2013 as well). During that admission she was treated with broad spectrum antibiotics as inpatient and then PO levaquin for 2 weeks after discharge. She had a CT chest 2/26 which showed resolving vs new infiltrate in the lingula and LLL. PCP repeated a CXR 3/22 which showed recurrence of LLL infiltrate. She has also had continued productive cough. PCCM consulted for possible bronchoscopy.   PAST MEDICAL HISTORY :   has a past medical history of Hypertension; Chronic back pain; Osteopenia; PVD (peripheral vascular  disease); Tobacco abuse; Vitamin D deficiency disease; Alcohol abuse, in remission (quit 2004); Neuromuscular disorder; Anxiety; Shortness of breath; Asthma; Hyperlipidemia; On home oxygen therapy; COPD (chronic obstructive pulmonary disease); Pneumonia; Type II diabetes mellitus; and Arthritis.  has past surgical history that includes Lumbar laminectomy/decompression microdiscectomy (2005); Appendectomy (1960); Tonsillectomy (1960); Posterior lumbar fusion (2002); Abdominal hysterectomy; Back surgery; Carpal tunnel release (Bilateral); and Cataract extraction w/ intraocular lens  implant, bilateral (Bilateral, 2015). Prior to Admission medications   Medication Sig Start Date End Date Taking? Authorizing Provider  acetaminophen (TYLENOL) 500 MG tablet Take 500-1,000 mg by mouth every 8 (eight) hours as needed (for pain).   Yes Historical Provider, MD  albuterol (PROVENTIL HFA;VENTOLIN HFA) 108 (90 BASE) MCG/ACT inhaler Inhale 2 puffs into the lungs every 4 (four) hours as needed for wheezing or shortness of breath.    Yes Historical Provider, MD  albuterol (PROVENTIL) (2.5 MG/3ML) 0.083% nebulizer solution Take 3 mLs (2.5 mg total) by nebulization every 4 (four) hours as needed for wheezing or shortness of breath. (may take 1 every 4 hours as needed for wheezing/shortness of breath) DX: 496 01/09/14  Yes [name], MD  alprazolam Prudy Feeler) 2 MG tablet Take 1-2 mg by mouth 3 (three) times daily as needed for anxiety.   Yes Historical Provider, MD  aspirin EC 81 MG EC tablet Take 1 tablet (81 mg total) by mouth daily. 01/09/14  Yes [name], MD  budesonide-formoterol [facility]) 160-4.5 MCG/ACT inhaler Inhale 1 puff into the lungs 2 (two) times daily.    Yes Historical Provider, MD  dextromethorphan-guaiFENesin (MUCINEX DM) 30-600 MG per 12  hr tablet Take 1 tablet by mouth 2 (two) times daily. 01/09/14  Yes [name], MD  fluticasone (FLONASE) 50 MCG/ACT nasal spray Place 1 spray into both nostrils  daily.   Yes Historical Provider, MD  furosemide (LASIX) 40 MG tablet Take 1 tablet (40 mg total) by mouth daily. 01/09/14  Yes [name], MD  HYDROcodone-acetaminophen (NORCO/VICODIN) 5-325 MG per tablet Take 1 tablet by mouth every 6 (six) hours as needed for pain.    Yes Historical Provider, MD  HYDROcodone-homatropine (HYCODAN) 5-1.5 MG/5ML syrup Take 5 mLs by mouth every 6 (six) hours as needed for cough. 02/27/14  Yes [name], MD  irbesartan (AVAPRO) 150 MG tablet Take 1 tablet (150 mg total) by mouth daily. 01/09/14  Yes [name], MD  metFORMIN (GLUCOPHAGE) 500 MG tablet Take 500 mg by mouth 2 (two) times daily with a meal.     Yes Historical Provider, MD  pantoprazole (PROTONIX) 40 MG tablet Take 1 tablet (40 mg total) by mouth daily at 6 (six) AM. 01/09/14  Yes [name], MD  tiotropium (SPIRIVA) 18 MCG inhalation capsule Place 1 capsule (18 mcg total) into inhaler and inhale daily. 04/09/12  Yes Jarome Matin, MD  varenicline (CHANTIX) 1 MG tablet Take 1 tablet (1 mg total) by mouth 2 (two) times daily. 02/27/14  Yes [name], MD  benzonatate (TESSALON) 100 MG capsule Take 1 capsule (100 mg total) by mouth 3 (three) times daily as needed for cough. Patient not taking: Reported on 04/16/2014 02/27/14   [name], MD  levofloxacin (LEVAQUIN) 500 MG tablet Take 1 tablet (500 mg total) by mouth daily. Patient not taking: Reported on 04/16/2014 02/27/14   [name], MD  predniSONE (DELTASONE) 10 MG tablet Take 4 pills daily for 3 days, then 2 pills daily for 3 days, then 1 pill daily for 3 days, then 1/2 pill daily thereafter. Patient not taking: Reported on 04/16/2014 02/27/14   [name], MD   Allergies  Allergen Reactions  . Brovana [Arformoterol Tartrate] Shortness Of Breath and Cough    General intolerance  . Budesonide Shortness Of Breath and Cough    General intolerance    FAMILY HISTORY:  family history includes Breast cancer in her sister; Cancer in her brother;  Heart attack in her brother; Heart attack (age of onset: 70) in her sister; Hypertension in her other. There is no history of Anesthesia problems. SOCIAL HISTORY:  reports that she has been smoking Cigarettes.  She has a 14 pack-year smoking history. She has never used smokeless tobacco. She reports that she drinks alcohol. She reports that she does not use illicit drugs.  REVIEW OF SYSTEMS:   Bolds are positive  Constitutional: weight loss, gain, night sweats, Fevers, chills, fatigue .  HEENT: headaches, Sore throat, sneezing, nasal congestion, post nasal drip, Difficulty swallowing, Tooth/dental problems, visual complaints visual changes, ear ache CV:  chest pain, radiates: ,Orthopnea, PND, swelling in lower extremities, dizziness, palpitations, syncope.  GI  heartburn, indigestion, abdominal pain, nausea, vomiting, diarrhea, change in bowel habits, loss of appetite, bloody stools.  Resp: cough, non-productive: , hemoptysis, dyspnea, chest pain, pleuritic.  Skin: rash or itching or icterus GU: dysuria, change in color of urine, urgency or frequency. flank pain, hematuria  MS: joint pain or swelling. decreased range of motion  Psych: change in mood or affect. depression or anxiety.  Neuro: difficulty with speech, weakness, numbness, ataxia    SUBJECTIVE:   VITAL SIGNS: Temp:  [98.4 F (36.9 C)-98.8 F (  37.1 C)] 98.4 F (36.9 C) (03/23 0426) Pulse Rate:  [87-100] 87 (03/23 0426) Resp:  [18-24] 18 (03/23 0426) BP: (101-120)/(39-67) 105/50 mmHg (03/23 0435) SpO2:  [94 %-100 %] 100 % (03/23 0950) Weight:  [51.891 kg (114 lb 6.4 oz)] 51.891 kg (114 lb 6.4 oz) (03/22 2012)  PHYSICAL EXAMINATION: General:  Thin female in NAD on baseline 2L Neuro:  Alert, oriented, non-focal HEENT:  [place]/AT, PERRL, no JVD Cardiovascular:  RRR, no MRG Lungs:  Poor air movement, scant exp wheeze Abdomen:  Soft, non-tender, non-distended Musculoskeletal:  No acute deformity or ROM limtiation Skin:  Grossly  intact   Recent Labs Lab 04/16/14 2224 04/17/14 0624  NA 142 143  K 3.8 3.8  CL 104 102  CO2 29 37*  BUN 13 12  CREATININE 0.89 0.75  GLUCOSE 134* 101*    Recent Labs Lab 04/16/14 2224 04/17/14 0624  HGB 10.0* 10.0*  HCT 33.1* 33.1*  WBC 12.2* 13.0*  PLT 256 254   Dg Chest 2 View  04/17/2014   CLINICAL DATA:  Shortness of breath, cough and congestion. Pulmonary infiltrate. History of COPD.  EXAM: CHEST - 2 VIEW  COMPARISON:  02/25/2014  FINDINGS: There is some residual scarring and potentially new or residual infiltrate remaining in the left lower lung, primarily localizing to the lingula in the lateral view. This is the region of previous infiltrate first identified on 02/22/2014. Some scarring remains at the right lung base. Lungs are hyperinflated and show evidence of stable COPD. No pneumothorax, pulmonary edema or significant pleural fluid identified. The heart size and mediastinal contours are normal. Mild spondylosis present of the thoracic spine.  IMPRESSION: Residual scarring and/or component of residual infiltrate remaining in the lingula. This is the region of prior pneumonia and there may be a new interval infiltrate present in the same location.   Electronically Signed   By: Irish LackGlenn  Yamagata M.D.   On: 04/17/2014 08:17    ASSESSMENT / PLAN:  GOLD D COPD without acute exacerbation LLL HCAP vs Aspiraiton PNA Tobacco abuse disorder (still smokes 4-5 cigarettes per day)  - Continue home O2 at 2 L/min. Goal SpO2 90-94% - Continue home BD regimen (Symbicort, Spiriva, PRN albuterol) - Resume home prednisone 5mg  (has been on since 11/2013) - Assess swallow function with SLP consult, may need FEES  (barium swallow was suspicious for aspiration) - Would defer FOB at this time pending aspiration evaluation - Could be managed as outpatient form pulmonary standpoint. F/u with Dr. Marchelle Gearingamaswamy after discharge. - Smoking cessation counseling   Joneen RoachPaul Hoffman, AGACNP-BC [place]  Pulmonology/Critical Care Pager [phone] or [phone]   PCCM ATTENDING: I have reviewed pt's initial presentation, consultants notes and hospital database in detail.  The above assessment and plan was formulated under my direction.  In summary: Smoker since age 69 - continues presently Emphysema by CT chest Chronic bronchitis by history Severe obstruction @ baseline (PFTs 2013) Lingular PNA, NOS in Jan of this year Persistent, albeit improving lingular opacity - could represent residual scarring or simply slowly resolving infiltrate Acute mucopurulent sputum with wheezing  I don't think that FOB has much to offer here as infiltrate/opacity in lingula is better than in Jan She needs to quit smoking altogether if we are to gain control of her airways disease Would treat her current symptoms as AECOPD  RECS: Increase prednisone to 60 mg/day X 3, then 40mg /d X 3, 20 mg/d X 3, stop Doxycycline 100 mg BID X 7 days treating purulent bronchitis  It is desirable to avoid long term systemic steroids in this setting as they are associated with worse outcomes in COPD population Cont bronchodilator therapy It would be desirable to have her on an inhaled steroid if possible (noting "allergy" to budesonide) I have counseled in great detail the need for smoking cessation She should F/U with Dr Marchelle Gearing (who she has seen in the past) in 1-2 weeks after discharge She should have a CXR performed @ the time of F/U with Dr Marchelle Gearing and this should be followed to resolution  Would only consider FOB if lingular opacity is relapsing or progressively worsening  PCCM will sign off. Please call if we can be of further assistance   Billy Fischer, MD;  PCCM service; [place] [phone]

## 2014-04-17 NOTE — Progress Notes (Signed)
Patient ID: Nicole Maxwell, female   DOB: 1945-12-20, 69 y.o.   MRN: [id number]  [facility] PROGRESS NOTE  Nicole Maxwell FAO:[id number] DOB: 1945-12-20 DOA: 04/16/2014 PCP: [name], William, MD   Brief narrative:    69 y.o. female with HTN, DM, diastolic CHF (last 2 D ECHO 12/2013 with EF 50% and grade I diastolic CHF), COPD oxygen dependent, sent from PCP Dr Alver FisherShaw's office for further evaluation of persistent LLL infiltrate and productive cough. Last admission from 1/28 - 2/3 for LLL HCAP and was discharged on Levaquin at that time to complete therapy for two more weeks.   Assessment/Plan:    Active Problems: Acute on chronic respiratory failure  - in pt with GOLD D COPD, recent LLL HCAP vs Aspiraiton PNA, smoker - per PCCM, increase prednisone to 60 mg/day X 3, then 40 mg/day X 3, 20 mg/day X 3, stop - doxycycline started and to complete for 7 days  - SLP requested    Chronic diastolic CHF - stable - continue Lasix    HTN (hypertension) - reasonable inpatient control - continue Avapro    DM type 2 (diabetes mellitus, type 2) - on Metformin at home, held since admission  - continue SSI    Anemia of chronic IDA - no signs of bleeding  DVT prophylaxis - SCD's  Code Status: Full.  Family Communication:  plan of care discussed with the patient Disposition Plan: Home when stable.   IV access:  Peripheral IV  Procedures and diagnostic studies:    CXR 04/17/2014  Residual scarring and/or component of residual infiltrate remaining in the lingula. This is the region of prior pneumonia and there may be a new interval infiltrate present in the same location.     Ct Chest W Contrast  03/22/2014  Chronic lung disease with emphysema. Largely resolved recent lingula infectious exacerbation, with mild residual lingula atelectasis and peribronchial thickening. 2. Extensive aortic atherosclerosis. Evidence of Coronary artery calcified plaque.     Medical Consultants:  PCCM  Other  Consultants:  None   IAnti-Infectives:   Doxycycline 3/22 -->  Debbora PrestoMAGICK-Takayla Baillie, MD  [facility] Pager [phone]  If 7PM-7AM, please contact night-coverage [email or web address] Password [email or web address] 04/17/2014, 5:49 PM     HPI/Subjective: No events overnight.   Objective: Filed Vitals:   04/17/14 1202 04/17/14 1216 04/17/14 1554 04/17/14 1627  BP: 117/99   100/71  Pulse:      Temp: 99.3 F (37.4 C)     TempSrc: Oral     Resp: 16     Height:      Weight:      SpO2: 97% 98% 100% 99%    Intake/Output Summary (Last 24 hours) at 04/17/14 1749 Last data filed at 04/17/14 1424  Gross per 24 hour  Intake    120 ml  Output   1150 ml  Net  -1030 ml    Exam:   General:  Pt is alert, follows commands appropriately, not in acute distress  Cardiovascular: Regular rate and rhythm, no rubs, no gallops  Respiratory: Scattered rhonchi and mild exp wheezing   Abdomen: Soft, non tender, non distended, bowel sounds present, no guarding  Extremities: pulses DP and PT palpable bilaterally  Neuro: Grossly nonfocal  Data Reviewed: Basic Metabolic Panel:  Recent Labs Lab 04/16/14 2224 04/17/14 0624  NA 142 143  K 3.8 3.8  CL 104 102  CO2 29 37*  GLUCOSE 134* 101*  BUN 13 12  CREATININE 0.89 0.75  CALCIUM 8.8 8.7  Liver Function Tests:  Recent Labs Lab 04/16/14 2224  AST 16  ALT 9  ALKPHOS 56  BILITOT 0.4  PROT 6.1  ALBUMIN 3.1*   CBC:  Recent Labs Lab 04/16/14 2224 04/17/14 0624  WBC 12.2* 13.0*  NEUTROABS 9.3*  --   HGB 10.0* 10.0*  HCT 33.1* 33.1*  MCV 85.8 86.4  PLT 256 254   CBG:  Recent Labs Lab 04/16/14 2147 04/17/14 0012 04/17/14 0422 04/17/14 0901 04/17/14 1159  GLUCAP 83 98 104* 108* 192*    Scheduled Meds: . albuterol  2.5 mg Nebulization Q4H  . budesonide-formoterol  2 puff Inhalation BID  . doxycycline  100 mg Oral Q12H  . furosemide  40 mg Oral Daily  . insulin aspart  0-9 Units Subcutaneous 6 times per day  . irbesartan  150 mg Oral  Daily  . pantoprazole  40 mg Oral Q0600  . predniSONE  5 mg Oral Q breakfast  . [START ON 04/18/2014] predniSONE  60 mg Oral Q breakfast  . sodium chloride  3 mL Intravenous Q12H  . tiotropium  18 mcg Inhalation Daily   Continuous Infusions:

## 2014-04-17 NOTE — Care Management Note (Unsigned)
    Page 1 of 1   04/17/2014     2:26:16 PM CARE MANAGEMENT NOTE 04/17/2014  Patient:  Earlyne IbaBAKER,Anitria S   Account Number:  [id number]  Date Initiated:  04/17/2014  Documentation initiated by:  Letha CapeAYLOR,Dael Howland  Subjective/Objective Assessment:   dx persistant left lower lobe infiltrate  admit- from home. patient is indep.     Action/Plan:   Anticipated DC Date:  04/19/2014   Anticipated DC Plan:  HOME/SELF CARE      DC Planning Services  CM consult      Choice offered to / List presented to:             Status of service:  In process, will continue to follow Medicare Important Message given?  NA - LOS <3 / Initial given by admissions (If response is "NO", the following Medicare IM given date fields will be blank) Date Medicare IM given:   Medicare IM given by:   Date Additional Medicare IM given:   Additional Medicare IM given by:    Discharge Disposition:    Per UR Regulation:  Reviewed for med. necessity/level of care/duration of stay  If discussed at Long Length of Stay Meetings, dates discussed:    Comments:  04/17/14 1425 Letha Capeeborah Raul Torrance RN BSN [phone] patient is from home, indep. NCM will cont to follow for dc needs.

## 2014-04-17 NOTE — Progress Notes (Signed)
NUTRITION BRIEF NOTE  Pt identified due to Malnutrition Screening Tool  Wt Readings from Last 10 Encounters:  04/16/14 114 lb 6.4 oz (51.891 kg)  02/21/14 117 lb 4.6 oz (53.2 kg)  01/09/14 114 lb 8 oz (51.937 kg)  08/01/12 137 lb (62.143 kg)  04/06/12 143 lb 8.3 oz (65.1 kg)  03/24/12 143 lb 12.8 oz (65.227 kg)  02/11/12 150 lb (68.04 kg)  01/11/12 149 lb 6.4 oz (67.767 kg)  12/08/11 150 lb 9.6 oz (68.312 kg)  10/14/11 149 lb 9.6 oz (67.858 kg)   Body mass index is 19.63 kg/(m^2). Pt meets criteria for Normal based on current BMI.  Current diet order is Regular; PO intake unavailable d/t previous NPO status. Pt reported feeling hungry, and ready for real food upon assessment. Pt denied any changes in weight or appetite pta. She stated that her clothes have not felt more loose. Pt was not interested in trying any supplements. Pt denied any difficulty chewing or swallowing.   NFPE did not reveal any fat or muscle depletion.  No nutrition interventions warranted at this time. If nutrition issues arise, please consult RD.    Cristela FeltElisa Litzy Dicker, MS Dietetic Intern Pager: [phone]

## 2014-04-18 DIAGNOSIS — J449 Chronic obstructive pulmonary disease, unspecified: Secondary | ICD-10-CM | POA: Diagnosis not present

## 2014-04-18 DIAGNOSIS — J9611 Chronic respiratory failure with hypoxia: Secondary | ICD-10-CM | POA: Diagnosis not present

## 2014-04-18 LAB — BASIC METABOLIC PANEL
Anion gap: 6 (ref 5–15)
BUN: 16 mg/dL (ref 6–23)
CALCIUM: 8.4 mg/dL (ref 8.4–10.5)
CO2: 33 mmol/L — ABNORMAL HIGH (ref 19–32)
CREATININE: 0.72 mg/dL (ref 0.50–1.10)
Chloride: 103 mmol/L (ref 96–112)
GFR, EST NON AFRICAN AMERICAN: 86 mL/min — AB (ref >90)
Glucose, Bld: 109 mg/dL — ABNORMAL HIGH (ref 70–99)
Potassium: 3.7 mmol/L (ref 3.5–5.1)
Sodium: 142 mmol/L (ref 135–145)

## 2014-04-18 LAB — CBC
HCT: 32.3 % — ABNORMAL LOW (ref 36.0–46.0)
Hemoglobin: 9.6 g/dL — ABNORMAL LOW (ref 12.0–15.0)
MCH: 25.8 pg — ABNORMAL LOW (ref 26.0–34.0)
MCHC: 29.7 g/dL — ABNORMAL LOW (ref 30.0–36.0)
MCV: 86.8 fL (ref 78.0–100.0)
PLATELETS: 258 10*3/uL (ref 150–400)
RBC: 3.72 MIL/uL — ABNORMAL LOW (ref 3.87–5.11)
RDW: 14.6 % (ref 11.5–15.5)
WBC: 10.2 10*3/uL (ref 4.0–10.5)

## 2014-04-18 LAB — GLUCOSE, CAPILLARY
GLUCOSE-CAPILLARY: 114 mg/dL — AB (ref 70–99)
Glucose-Capillary: 85 mg/dL (ref 70–99)
Glucose-Capillary: 89 mg/dL (ref 70–99)
Glucose-Capillary: 230 mg/dL — ABNORMAL HIGH (ref 70–99)

## 2014-04-18 LAB — HEMOGLOBIN A1C
Hgb A1c MFr Bld: 6 % — ABNORMAL HIGH (ref 4.8–5.6)
MEAN PLASMA GLUCOSE: 126 mg/dL

## 2014-04-18 MED ORDER — DOXYCYCLINE HYCLATE 100 MG PO TABS: 100.0000 mg | ORAL_TABLET | Freq: Two times a day (BID) | ORAL | Status: DC

## 2014-04-18 MED ORDER — BUDESONIDE-FORMOTEROL FUMARATE 160-4.5 MCG/ACT IN AERO: 2.0000 | INHALATION_SPRAY | Freq: Two times a day (BID) | RESPIRATORY_TRACT | Status: DC

## 2014-04-18 MED ORDER — ALPRAZOLAM 2 MG PO TABS: 1.0000 mg | ORAL_TABLET | Freq: Three times a day (TID) | ORAL | Status: DC | PRN

## 2014-04-18 MED ORDER — PREDNISONE 20 MG PO TABS: ORAL_TABLET | ORAL | Status: DC

## 2014-04-18 MED ORDER — HYDROCODONE-ACETAMINOPHEN 5-325 MG PO TABS: 1.0000 | ORAL_TABLET | Freq: Four times a day (QID) | ORAL | Status: DC | PRN

## 2014-04-18 NOTE — Discharge Summary (Signed)
Nicole Maxwell to be D/C'd Home per MD order.  Discussed with the patient and all questions fully answered.  VSS, Skin clean, dry and intact without evidence of skin break down, no evidence of skin tears noted. IV catheter discontinued intact. Site without signs and symptoms of complications. Dressing and pressure applied.  An After Visit Summary was printed and given to the patient. Patient received prescription.  D/c education completed with patient/family including follow up instructions, medication list, d/c activities limitations if indicated, with other d/c instructions as indicated by MD - patient able to verbalize understanding, all questions fully answered.   Patient instructed to return to ED, call 911, or call MD for any changes in condition.   Patient escorted via WC, and D/C home via private auto.  Nicole Maxwell, Nicole Maxwell 04/18/2014 1:33 PM

## 2014-04-18 NOTE — Discharge Instructions (Signed)
Pneumonia, Adult  Pneumonia is an infection of the lungs. It may be caused by a germ (virus or bacteria). Some types of pneumonia can spread easily from person to person. This can happen when you cough or sneeze.  HOME CARE   Only take medicine as told by your doctor.   Take your medicine (antibiotics) as told. Finish it even if you start to feel better.   Do not smoke.   You may use a vaporizer or humidifier in your room. This can help loosen thick spit (mucus).   Sleep so you are almost sitting up (semi-upright). This helps reduce coughing.   Rest.  A shot (vaccine) can help prevent pneumonia. Shots are often advised for:   People over 65 years old.   Patients on chemotherapy.   People with long-term (chronic) lung problems.   People with immune system problems.  GET HELP RIGHT AWAY IF:    You are getting worse.   You cannot control your cough, and you are losing sleep.   You cough up blood.   Your pain gets worse, even with medicine.   You have a fever.   Any of your problems are getting worse, not better.   You have shortness of breath or chest pain.  MAKE SURE YOU:    Understand these instructions.   Will watch your condition.   Will get help right away if you are not doing well or get worse.  Document Released: 06/30/2007 Document Revised: 04/05/2011 Document Reviewed: 04/03/2010  ExitCare Patient Information 2015 [employer]. This information is not intended to replace advice given to you by your health care provider. Make sure you discuss any questions you have with your health care provider.

## 2014-04-18 NOTE — Progress Notes (Signed)
Speech Language Pathology Treatment:    Patient Details Name: Nicole Maxwell MRN: [id number] DOB: 25-Mar-1945 Today's Date: 04/18/2014 Time: 1020-1040 SLP Time Calculation (min) (ACUTE ONLY): 20 min  Assessment / Plan / Recommendation Clinical Impression  SLP provided f/u education with pt regarding aspiration precautions. Given prior evaluations, pt with mild oropharyngeal dysphagia, unlikely to be primary factor in recurrent lung disease.  SLP reviewed strategies with pt, observed with liquids with good tolerance. Pt does have moderate risk of post prandial aspiration given report of dysmotility and questionable stricture on last esophagram. No f/u with SLP warranted though pt may benefit from f/u with GI for endoscopic evaluation if pt develops progressive solid food dysphagia or there are increasing concerns for aspiration of gastric contents. SLP will sign off.    HPI HPI: Nicole Maxwell is a 69 year old female with a history of O2 dependent COPD who was sent by her PCP Dr Clelia CroftShaw for Direct admission to the ED due to a recurring LLL infiltrate.  Of note, she was admitted 02/03 for LLL Pneumonia and was treated with IV Vancomycin and Cefepime and on discharge she was treated for 2 additional weeks with Levaquin.   Her PCP performed a repeat chest x-ray in the interim,which revealed some clearing, and then a ct scan of the chest on 02/26 which revealed resolution of the infectious process with lingular Atelectasis.  And this week a chest x-ray was performed and revealed recurrence of the LLL infiltrate.  Additionally, patient is known to this service from her previous admissions with MBS completed 02/22/14 and Bedside/reeducation on 02/26/14.     Pertinent Vitals Pain Assessment: No/denies pain  SLP Plan  Continue with current plan of care    Recommendations Diet recommendations: Regular;Thin liquid Liquids provided via: Cup;No straw Medication Administration: Whole meds with liquid Supervision: Patient  able to self feed;Intermittent supervision to cue for compensatory strategies Compensations: Slow rate;Small sips/bites;Follow solids with liquid Postural Changes and/or Swallow Maneuvers: Out of bed for meals;Seated upright 90 degrees;Upright 30-60 min after meal              Oral Care Recommendations: Oral care BID Follow up Recommendations: None Plan: Continue with current plan of care    GO Functional Assessment Tool Used: skilled clinical judgement  Functional Limitations: Swallowing Swallow Current Status ([id number]): At least 20 percent but less than 40 percent impaired, limited or restricted Swallow Goal Status [phone]([id number]): At least 20 percent but less than 40 percent impaired, limited or restricted Swallow Discharge Status [phone]([id number]): At least 20 percent but less than 40 percent impaired, limited or restricted   Jennfer Gassen, Riley NearingBonnie Caroline 04/18/2014, 11:22 AM

## 2014-04-18 NOTE — Discharge Summary (Signed)
Physician Discharge Summary  Nicole Maxwell ZOX:[id number] DOB: November 16, 1945 DOA: 04/16/2014  PCP: [name], MD  Admit date: 04/16/2014 Discharge date: 04/18/2014  Recommendations for Outpatient Follow-up:  1. Pt will need to follow up with PCP in 2-3 weeks post discharge 2. Please obtain BMP to evaluate electrolytes and kidney function 3. Please also check CBC to evaluate Hg and Hct levels 4. Pt advised to continue Doxycycline for 7 more days post discharge  5. Pt also placed on Prednisone tapering as noted below   Discharge Diagnoses:  Active Problems:   Severe chronic obstructive pulmonary disease   HTN (hypertension)   Hyperlipidemia   Pulmonary infiltrate in left lung on chest x-ray   DM type 2 (diabetes mellitus, type 2)   Chronic respiratory failure   Vitamin D deficiency disease   Pulmonary infiltrate   Discharge Condition: Stable  Diet recommendation: Heart healthy diet discussed in details    Brief narrative:    69 y.o. female with HTN, DM, diastolic CHF (last 2 D ECHO 12/2013 with EF 50% and grade I diastolic CHF), COPD oxygen dependent, sent from PCP Dr Alver Fisher office for further evaluation of persistent LLL infiltrate and productive cough. Last admission from 1/28 - 2/3 for LLL HCAP and was discharged on Levaquin at that time to complete therapy for two more weeks.   Assessment/Plan:    Active Problems: Acute on chronic respiratory failure  - in pt with GOLD D COPD, recent LLL HCAP vs Aspiraiton PNA, smoker - per PCCM, increase prednisone to 60 mg/day X 3, then 40 mg/day X 3, 20 mg/day X 3, stop - doxycycline started and to complete for 7 days  - SLP done, regular thin liquid diet recommended   Chronic diastolic CHF - stable - continue Lasix per home medical regimen   HTN (hypertension) - reasonable inpatient control - continue Avapro   DM type 2 (diabetes mellitus, type 2) - on Metformin at home  Anemia of chronic IDA - no signs of  bleeding  Code Status: Full.  Family Communication: plan of care discussed with the patient Disposition Plan: Home   IV access:  Peripheral IV  Procedures and diagnostic studies:   CXR 04/17/2014 Residual scarring and/or component of residual infiltrate remaining in the lingula. This is the region of prior pneumonia and there may be a new interval infiltrate present in the same location.   Ct Chest W Contrast 03/22/2014 Chronic lung disease with emphysema. Largely resolved recent lingula infectious exacerbation, with mild residual lingula atelectasis and peribronchial thickening. 2. Extensive aortic atherosclerosis. Evidence of Coronary artery calcified plaque.   Medical Consultants:  PCCM  Other Consultants:  None   IAnti-Infectives:   Doxycycline 3/22 --> 7 more days post discharge        Discharge Exam: Filed Vitals:   04/18/14 0729  BP: 147/57  Pulse: 95  Temp: 98.1 F (36.7 C)  Resp: 18   Filed Vitals:   04/18/14 0103 04/18/14 0402 04/18/14 0729 04/18/14 0759  BP:  121/57 147/57   Pulse:  84 95   Temp:  98.2 F (36.8 C) 98.1 F (36.7 C)   TempSrc:  Oral Oral   Resp:  18 18   Height:      Weight:  52 kg (114 lb 10.2 oz)    SpO2: 100% 100% 97% 100%    General: Pt is alert, follows commands appropriately, not in acute distress Cardiovascular: Regular rate and rhythm, no rubs, no gallops Respiratory: Bilateral rhonchi with no  wheezing  Abdominal: Soft, non tender, non distended, bowel sounds +, no guarding Extremities: no edema, no cyanosis, pulses palpable bilaterally DP and PT Neuro: Grossly nonfocal  Discharge Instructions  Discharge Instructions    Diet - low sodium heart healthy    Complete by:  As directed      Increase activity slowly    Complete by:  As directed             Medication List    STOP taking these medications        benzonatate 100 MG capsule  Commonly known as:  TESSALON     HYDROcodone-homatropine  5-1.5 MG/5ML syrup  Commonly known as:  HYCODAN     levofloxacin 500 MG tablet  Commonly known as:  LEVAQUIN      TAKE these medications        acetaminophen 500 MG tablet  Commonly known as:  TYLENOL  Take 500-1,000 mg by mouth every 8 (eight) hours as needed (for pain).     albuterol 108 (90 BASE) MCG/ACT inhaler  Commonly known as:  PROVENTIL HFA;VENTOLIN HFA  Inhale 2 puffs into the lungs every 4 (four) hours as needed for wheezing or shortness of breath.     albuterol (2.5 MG/3ML) 0.083% nebulizer solution  Commonly known as:  PROVENTIL  Take 3 mLs (2.5 mg total) by nebulization every 4 (four) hours as needed for wheezing or shortness of breath. (may take 1 every 4 hours as needed for wheezing/shortness of breath) DX: 496     alprazolam 2 MG tablet  Commonly known as:  XANAX  Take 0.5-1 tablets (1-2 mg total) by mouth 3 (three) times daily as needed for anxiety.     aspirin 81 MG EC tablet  Take 1 tablet (81 mg total) by mouth daily.     budesonide-formoterol 160-4.5 MCG/ACT inhaler  Commonly known as:  SYMBICORT  Inhale 2 puffs into the lungs 2 (two) times daily.     dextromethorphan-guaiFENesin 30-600 MG per 12 hr tablet  Commonly known as:  MUCINEX DM  Take 1 tablet by mouth 2 (two) times daily.     doxycycline 100 MG tablet  Commonly known as:  VIBRA-TABS  Take 1 tablet (100 mg total) by mouth every 12 (twelve) hours.     fluticasone 50 MCG/ACT nasal spray  Commonly known as:  FLONASE  Place 1 spray into both nostrils daily.     furosemide 40 MG tablet  Commonly known as:  LASIX  Take 1 tablet (40 mg total) by mouth daily.     HYDROcodone-acetaminophen 5-325 MG per tablet  Commonly known as:  NORCO/VICODIN  Take 1 tablet by mouth every 6 (six) hours as needed for moderate pain.     irbesartan 150 MG tablet  Commonly known as:  AVAPRO  Take 1 tablet (150 mg total) by mouth daily.     metFORMIN 500 MG tablet  Commonly known as:  GLUCOPHAGE  Take 500 mg  by mouth 2 (two) times daily with a meal.     pantoprazole 40 MG tablet  Commonly known as:  PROTONIX  Take 1 tablet (40 mg total) by mouth daily at 6 (six) AM.     predniSONE 20 MG tablet  Commonly known as:  DELTASONE  Take 60 mg tablet for 3 days, 40 mg tablet for 3 days, 20 mg tablet for 3 days, stop     tiotropium 18 MCG inhalation capsule  Commonly known as:  SPIRIVA  Place 1  capsule (18 mcg total) into inhaler and inhale daily.     varenicline 1 MG tablet  Commonly known as:  CHANTIX  Take 1 tablet (1 mg total) by mouth 2 (two) times daily.           Follow-up Information    Follow up with [name], William, MD.   Specialty:  Internal Medicine   Contact information:   [address] [place] [address] [phone]       [facility].   Specialty:  Pulmonary Disease   Contact information:   [address] [place] [address] [phone]       Follow up with Debbora PrestoMAGICK-Jermone Geister, MD.   Specialty:  Internal Medicine   Why:  call my cell phone [phone]   Contact information:   [address] [place] [address] [phone]        The results of significant diagnostics from this hospitalization (including imaging, microbiology, ancillary and laboratory) are listed below for reference.     Microbiology: No results found for this or any previous visit (from the past 240 hour(s)).   Labs: Basic Metabolic Panel:  Recent Labs Lab 04/16/14 2224 04/17/14 0624 04/18/14 0721  NA 142 143 142  K 3.8 3.8 3.7  CL 104 102 103  CO2 29 37* 33*  GLUCOSE 134* 101* 109*  BUN 13 12 16   CREATININE 0.89 0.75 0.72  CALCIUM 8.8 8.7 8.4   Liver Function Tests:  Recent Labs Lab 04/16/14 2224  AST 16  ALT 9  ALKPHOS 56  BILITOT 0.4  PROT 6.1  ALBUMIN 3.1*   CBC:  Recent Labs Lab 04/16/14 2224 04/17/14 0624 04/18/14 0721  WBC 12.2* 13.0* 10.2  NEUTROABS 9.3*  --   --   HGB 10.0* 10.0* 9.6*  HCT 33.1*  33.1* 32.3*  MCV 85.8 86.4 86.8  PLT 256 254 258    ProBNP (last 3 results)  Recent Labs  01/01/14 1213 01/03/14 0850  PROBNP 364.2* 724.5*    CBG:  Recent Labs Lab 04/17/14 1816 04/17/14 2012 04/18/14 0004 04/18/14 0400 04/18/14 0730  GLUCAP 217* 121* 114* 85 89     SIGNED: Time coordinating discharge: Over 30 minutes  Debbora PrestoMAGICK-Jalyn Rosero, MD  [facility] 04/18/2014, 11:18 AM Pager [phone]  If 7PM-7AM, please contact night-coverage [email or web address] Password [email or web address]

## 2014-05-01 ENCOUNTER — Other Ambulatory Visit (HOSPITAL_COMMUNITY): Payer: Self-pay | Admitting: Internal Medicine

## 2014-05-01 DIAGNOSIS — Z1231 Encounter for screening mammogram for malignant neoplasm of breast: Secondary | ICD-10-CM

## 2014-05-09 ENCOUNTER — Telehealth: Payer: Self-pay | Admitting: Internal Medicine

## 2014-05-09 NOTE — Telephone Encounter (Signed)
lmtcb for Nicole Maxwell to let her know that MR is completely booked until her rov on 5/2

## 2014-05-09 NOTE — Telephone Encounter (Signed)
Kim returning call said that you can call her back in the morning, but said that it's ok to sched w/NP said to call pt and just let her know what time the appoint is said that pt can be reache @ [phone].Caren GriffinsStanley A Dalton

## 2014-05-09 NOTE — Telephone Encounter (Signed)
ATC pt X 3. Line busy. WCB.  

## 2014-05-10 NOTE — Telephone Encounter (Signed)
Spoke with pt, will think about a visit with TP and call us back.

## 2014-05-13 NOTE — Telephone Encounter (Signed)
lmtcb x1 for pt. 

## 2014-05-14 NOTE — Telephone Encounter (Signed)
Will close message at pt will contact us for an appt.

## 2014-05-16 ENCOUNTER — Encounter: Payer: Self-pay | Admitting: Adult Health

## 2014-05-16 ENCOUNTER — Ambulatory Visit (INDEPENDENT_AMBULATORY_CARE_PROVIDER_SITE_OTHER): Payer: Medicare Other | Admitting: Adult Health

## 2014-05-16 ENCOUNTER — Ambulatory Visit (INDEPENDENT_AMBULATORY_CARE_PROVIDER_SITE_OTHER)
Admission: RE | Admit: 2014-05-16 | Discharge: 2014-05-16 | Disposition: A | Discharge status: Home / Self Care | Payer: Medicare Other | Source: Ambulatory Visit | Attending: Adult Health | Admitting: Adult Health

## 2014-05-16 VITALS — BP 116/60 | Temp 98.1°F | Ht 64.0 in | Wt 112.0 lb

## 2014-05-16 DIAGNOSIS — J189 Pneumonia, unspecified organism: Secondary | ICD-10-CM

## 2014-05-16 DIAGNOSIS — J441 Chronic obstructive pulmonary disease with (acute) exacerbation: Secondary | ICD-10-CM | POA: Diagnosis not present

## 2014-05-16 NOTE — Patient Instructions (Signed)
Continue on Symbicort and Spiriva .  Continue on oxygen 2l/m .  Continue to work on not smoking.  follow up Dr. Marchelle Gearingamaswamy in 6 weeks and As needed   Please contact office for sooner follow up if symptoms do not improve or worsen or seek emergency care

## 2014-05-16 NOTE — Progress Notes (Signed)
Subjective:    Patient ID: Nicole Maxwell, female    DOB: July 29, 1945, 69 y.o.   MRN: [id number]  HPI HPI #COPD - Oxygen dependent since 2011  - Doubtamine stress echo 05/12/10 - normal, CXR July 2011 - clear  - PFTs 04/13/2010 - shows Gold stage 4 COPD with BD response/asthma component (fev1 0.5L/29%, 10% BD response on fev1 and 38% on FC), DLCO 27%)  - Spirometry Jan 2013 - fev1 0.74L/37%, RAtop 44 - shows gold stage 3 copd  - Class 2-3 exertional dyspnea with precipitants emotion, smoke, lying flat, weather change, pollen, odors  - Chronic cough with white sputjum  - CAT score - 23 in May 2013 - CAT score 19 in NOv 2013  #Smoking  #Chronic back pain - s/p lumbar fusion surgery ? Date  - Preop pulmonary evaluatio prior to spine surgery - 2013, surgery by Dr Danielle Dess never happend  #There is no history of allergies, aspirin allergies, asthma, bronchiolitis, CAD, DVT, a heart failure, PE, pneumonia  # Poor ability to understand questions and answer them. Difficulty following med advice  - noticed since march 2013  OV 01/11/2012    Presents with husband. Totally unclear again what her main issue is. ON CAT score is seems ithe baseline range of 18-23 with score today being 22. She admits to no changein baseline symptoms . She showed her med calendraw and swore she is on albuterol + atrovent scheduled + qvar mdi 2 puff bid. But she again points to [place] and spiriva as having worke well for her. But when I asked her if she wants to switch to them she has no answer. She says "well it is too costly". I asked her if nebss not working well for her and she could not answer the question one way or another.  Of note, she continues to attend rehab  Repeat pattern of not answering questions directly nor asking questions. Her husband too chided her for this. No one in office can understand what her concerns are. I have asked her to write questions down in future before doc visit   Still smoking:  Unable to quit   02/11/12 follow up  Returns for follow up and med review  pt brought all meds with her today.  c/o nebs being too expensive thru mailorder Pt gets confused very easily with detail. Unclear if she is taking med correctly .  She says mail order is very expensive for nebs . Inhalers were also too expensive .  She agreed to try DME to see if more cost effective No flare in cough or dyspnea.  No edema.  >>retrial of pulmicort   03/24/2012 Follow up COPD  Returns 6 week for her COPD Last visit. We restarted a trial of Pulmicort. Her nebulizers Unable to tolerate budesonide feels it make her worse.  She is Still smoking and does not have any interest in quitting smoking We discussed the dangers of smoking and also smoking around oxygen. She denies any hemoptysis, orthopnea, PND, or leg swelling She had a recent COPD flare with bronchitis. 3 weeks ago and was given prednisone and doxycycline, which seemed to help her symptoms.   OV 08/01/2012 FU COPD and smoking  Denies any acout or subacute symptomatology. We had her do med calendar and had her on nebs. But again, she says her copd meds are spiriva and symbicort; she brought these inhalers with her. I cannot figure out from her what her real meds are. She is keen  on getting samples.She says she quit smoking but then her last cigarette was this morning!!!    CAT COPD Symptom and Quality of Life Score ([employer])  06/11/11 10/08/2011  12/08/2011  01/11/2012  08/01/2012   Never Cough -> Cough all the time No phlegm in chest -> Chest is full of phlegm No chest tightness -> Chest feels very tight No dyspnea for 1 flight stairs/hill -> Very dyspneic for 1 flight of stairs No limitations for ADL at home -> Very limited with ADL at home Confident leaving home -> Not at all confident leaving home 5 1 0 2 2  Sleep soundly -> Do not sleep soundly because of lung  condition Lots of Energy -> No energy at all TOTAL Score (max 40)  05/16/2014 Post hospital follow up : COPD and Chronic Resp Failure on O2 Pt returns for post hospital follow up .  Admitted March 22 through March 24 for acute on chronic respiratory failure with recurrent healthcare associated pneumonia. She had been admitted also in February for severe COPD exacerbation. She was treated with IV antibiotics. And transition to antibiotics. At discharge. She is feeling improved with decreased cough and shortness of breath. She does have some lingering cough and intermittent wheezing but this also is much better. CXR today showed cleared PNA.  She does continue to smoke. We discussed smoking cessation. She denies any hemoptysis, orthopnea, PND or leg swelling. Speech swallow eval showed mild dysphagia    Review of Systems Constitutional:   No  weight loss, night sweats,  Fevers, chills,  +fatigue, or  lassitude.  HEENT:   No headaches,  Difficulty swallowing,  Tooth/dental problems, or  Sore throat,                No sneezing, itching, ear ache,  +nasal congestion, post nasal drip,   CV:  No chest pain,  Orthopnea, PND, swelling in lower extremities, anasarca, dizziness, palpitations, syncope.   GI  No heartburn, indigestion, abdominal pain, nausea, vomiting, diarrhea, change in bowel habits, loss of appetite, bloody stools.   Resp:   No chest wall deformity  Skin: no rash or lesions.  GU: no dysuria, change in color of urine, no urgency or frequency.  No flank pain, no hematuria   MS:  No joint pain or swelling.  No decreased range of motion.  No back pain.  Psych:  No change in mood or affect. No depression or anxiety.  No memory loss.      Objective:   Physical Exam GEN: A/Ox3; pleasant , NAD  HEENT:  [place]/AT,  EACs-clear, TMs-wnl, NOSE-clear, THROAT-clear, no lesions, no postnasal drip or exudate noted.   NECK:  Supple w/ fair  ROM; no JVD; normal carotid impulses w/o bruits; no thyromegaly or nodules palpated; no lymphadenopathy.  RESP  Decreased BS in bases w/ no wheezingno accessory muscle use, no dullness to percussion  CARD:  RRR, no m/r/g  , no peripheral edema, pulses intact, no cyanosis or clubbing.  GI:   Soft & nt; nml bowel sounds; no organomegaly or masses detected.  Musco: Warm bil, no deformities or joint swelling noted.   Neuro: alert, no focal deficits noted.  Skin: Warm, no lesions or rashes    cXR 4/21>Clearing of lingular opacity noted previously. Hyperaeration consistent with asthma or emphysema. Reviewed independently       Assessment & Plan:

## 2014-05-20 ENCOUNTER — Other Ambulatory Visit: Payer: Self-pay | Admitting: Internal Medicine

## 2014-05-20 NOTE — Assessment & Plan Note (Signed)
Recurrent exacerbation in an ongoing smoker  Plan  Continue on Symbicort and Spiriva .  Continue on oxygen 2l/m .  Continue to work on not smoking.  follow up Dr. Marchelle Gearingamaswamy in 6 weeks and As needed   Please contact office for sooner follow up if symptoms do not improve or worsen or seek emergency care

## 2014-05-20 NOTE — Assessment & Plan Note (Addendum)
HCAP >clinically improved ,  cxr shows resolved PNA.    Plan  Continue on Symbicort and Spiriva .  Continue on oxygen 2l/m .  Continue to work on not smoking.  follow up Dr. Marchelle Gearingamaswamy in 6 weeks and As needed   Please contact office for sooner follow up if symptoms do not improve or worsen or seek emergency care

## 2014-05-27 ENCOUNTER — Inpatient Hospital Stay: Payer: Medicare Other | Admitting: Internal Medicine

## 2014-06-06 ENCOUNTER — Ambulatory Visit (HOSPITAL_COMMUNITY): Payer: Medicare Other

## 2014-06-07 ENCOUNTER — Ambulatory Visit (HOSPITAL_COMMUNITY)
Admission: RE | Admit: 2014-06-07 | Discharge: 2014-06-07 | Disposition: A | Discharge status: Home / Self Care | Payer: Medicare Other | Source: Ambulatory Visit | Attending: Internal Medicine | Admitting: Internal Medicine

## 2014-06-07 DIAGNOSIS — Z1231 Encounter for screening mammogram for malignant neoplasm of breast: Secondary | ICD-10-CM | POA: Diagnosis present

## 2014-07-01 ENCOUNTER — Ambulatory Visit (INDEPENDENT_AMBULATORY_CARE_PROVIDER_SITE_OTHER): Payer: Medicare Other | Admitting: Internal Medicine

## 2014-07-01 ENCOUNTER — Encounter: Payer: Self-pay | Admitting: Internal Medicine

## 2014-07-01 VITALS — BP 98/54 | HR 94 | Ht 64.0 in | Wt 115.0 lb

## 2014-07-01 DIAGNOSIS — J9611 Chronic respiratory failure with hypoxia: Secondary | ICD-10-CM | POA: Diagnosis not present

## 2014-07-01 DIAGNOSIS — Z720 Tobacco use: Secondary | ICD-10-CM | POA: Diagnosis not present

## 2014-07-01 DIAGNOSIS — R5381 Other malaise: Secondary | ICD-10-CM | POA: Insufficient documentation

## 2014-07-01 DIAGNOSIS — J449 Chronic obstructive pulmonary disease, unspecified: Secondary | ICD-10-CM

## 2014-07-01 DIAGNOSIS — F17200 Nicotine dependence, unspecified, uncomplicated: Secondary | ICD-10-CM | POA: Insufficient documentation

## 2014-07-01 NOTE — Progress Notes (Signed)
Subjective:    Patient ID: Nicole Maxwell, female    DOB: 04/25/1945, 69 y.o.   MRN: [id number]  HPI  #COPD - Oxygen dependent since 2011  - Doubtamine stress echo 05/12/10 - normal, CXR July 2011 - clear  - PFTs 04/13/2010 - shows Gold stage 4 COPD with BD response/asthma component (fev1 0.5L/29%, 10% BD response on fev1 and 38% on FC), DLCO 27%)  - Spirometry Jan 2013 - fev1 0.74L/37%, RAtop 44 - shows gold stage 3 copd  - Class 2-3 exertional dyspnea with precipitants emotion, smoke, lying flat, weather change, pollen, odors  - Chronic cough with white sputjum  - CAT score - 23 in May 2013 - CAT score 19 in NOv 2013  #Smoking  #Chronic back pain - s/p lumbar fusion surgery ? Date  - Preop pulmonary evaluatio prior to spine surgery - 2013, surgery by Dr Danielle Dess never happend  #There is no history of allergies, aspirin allergies, asthma, bronchiolitis, CAD, DVT, a heart failure, PE, pneumonia  # Poor ability to understand questions and answer them. Difficulty following med advice  - noticed since march 2013  OV 01/11/2012    Presents with husband. Totally unclear again what her main issue is. ON CAT score is seems ithe baseline range of 18-23 with score today being 22. She admits to no changein baseline symptoms . She showed her med calendraw and swore she is on albuterol + atrovent scheduled + qvar mdi 2 puff bid. But she again points to [place] and spiriva as having worke well for her. But when I asked her if she wants to switch to them she has no answer. She says "well it is too costly". I asked her if nebss not working well for her and she could not answer the question one way or another.  Of note, she continues to attend rehab  Repeat pattern of not answering questions directly nor asking questions. Her husband too chided her for this. No one in office can understand what her concerns are. I have asked her to write questions down in future before doc visit   Still smoking: Unable  to quit   02/11/12 follow up  Returns for follow up and med review  pt brought all meds with her today.  c/o nebs being too expensive thru mailorder Pt gets confused very easily with detail. Unclear if she is taking med correctly .  She says mail order is very expensive for nebs . Inhalers were also too expensive .  She agreed to try DME to see if more cost effective No flare in cough or dyspnea.  No edema.  >>retrial of pulmicort   03/24/2012 Follow up COPD  Returns 6 week for her COPD Last visit. We restarted a trial of Pulmicort. Her nebulizers Unable to tolerate budesonide feels it make her worse.  She is Still smoking and does not have any interest in quitting smoking We discussed the dangers of smoking and also smoking around oxygen. She denies any hemoptysis, orthopnea, PND, or leg swelling She had a recent COPD flare with bronchitis. 3 weeks ago and was given prednisone and doxycycline, which seemed to help her symptoms.   OV 08/01/2012 FU COPD and smoking  Denies any acout or subacute symptomatology. We had her do med calendar and had her on nebs. But again, she says her copd meds are spiriva and symbicort; she brought these inhalers with her. I cannot figure out from her what her real meds are. She is  keen on getting samples.She says she quit smoking but then her last cigarette was this morning!!!    05/16/2014 Post hospital follow up : COPD and Chronic Resp Failure on O2 Pt returns for post hospital follow up .  Admitted March 22 through March 24 for acute on chronic respiratory failure with recurrent healthcare associated pneumonia. She had been admitted also in February for severe COPD exacerbation. She was treated with IV antibiotics. And transition to antibiotics. At discharge. She is feeling improved with decreased cough and shortness of breath. She does have some lingering cough and intermittent wheezing but this also is much better. CXR today showed cleared PNA.  She  does continue to smoke. We discussed smoking cessation. She denies any hemoptysis, orthopnea, PND or leg swelling. Speech swallow eval showed mild dysphagia      OV 07/01/2014  Chief Complaint  Patient presents with  . Follow-up    Pt last seen by MR on 08/01/2012. Pt saw TP on 05/16/14 for HFU for HCAP. Pt stated she feel her breahting has worsened since last OV. Pt c/o increase SOB, prod cough with light yellow mucus, epigastric pain.     69 year old female with severe COPD and chronic respiratory failure oxygen dependent. I personally have not seen her since 2014. I last saw her when I ran into her at a wedding  over a year ago. She was doing fine up until earlier this year and since then has had 3 admissions according to her and granddaughter in the last 6 months all respiratory related. Now significantly deconditioned. She did get some home physical therapy and this helped somewhat. She expresses dissatisfaction with some of the physical therapy providers and though she acknowledges she needs physical therapy again she is very specific as to who can come and help her. Currently she is on triple inhaler therapy and starting January 2016 primary care physician has started her on daily prednisone 5 mg per day.   She continues to smoke.   Overall she is expressing moderate amount of fatigue, exhaustion cough and dyspnea all of which are baseline. There are no COPD exacerbation symptoms   After much discussion - agrees to home PT at grand daughter insistence  CAT COPD Symptom and Quality of Life Score (glaxo smith kline trademark)  06/11/11 10/08/2011  12/08/2011  01/11/2012  08/01/2012   Never Cough -> Cough all the time No phlegm in chest -> Chest is full of phlegm No chest tightness -> Chest feels very tight No dyspnea for 1 flight stairs/hill -> Very dyspneic for 1 flight of stairs No limitations for ADL at home -> Very limited with ADL at home  Confident leaving home -> Not at all confident leaving home 5 1 0 2 2  Sleep soundly -> Do not sleep soundly because of lung condition Lots of Energy -> No energy at all TOTAL Score (max 40)  Review of Systems  Constitutional: Negative for fever and unexpected weight change.  HENT: Negative for congestion, dental problem, ear pain, nosebleeds, postnasal drip, rhinorrhea, sinus pressure, sneezing, sore throat and trouble swallowing.   Eyes: Negative for redness and itching.  Respiratory: Positive for cough, chest tightness and shortness of breath.  Negative for wheezing.   Cardiovascular: Negative for palpitations and leg swelling.  Gastrointestinal: Negative for nausea and vomiting.  Genitourinary: Negative for dysuria.  Musculoskeletal: Negative for joint swelling.  Skin: Negative for rash.  Neurological: Negative for headaches.  Hematological: Does not bruise/bleed easily.  Psychiatric/Behavioral: Negative for dysphoric mood. The patient is not nervous/anxious.        Objective:   Physical Exam  Constitutional: She is oriented to person, place, and time. No distress.  Body mass index is 19.73 kg/(m^2). Frail Significantly deconditioned compared to the past  HENT:  Head: Normocephalic and atraumatic.  Right Ear: External ear normal.  Left Ear: External ear normal.  Mouth/Throat: Oropharynx is clear and moist. No oropharyngeal exudate.  O2 on  Eyes: Conjunctivae and EOM are normal. Pupils are equal, round, and reactive to light. Right eye exhibits no discharge. Left eye exhibits no discharge. No scleral icterus.  Neck: Normal range of motion. Neck supple. No JVD present. No tracheal deviation present. No thyromegaly present.  Cardiovascular: Normal rate, regular rhythm, normal heart sounds and intact distal pulses.  Exam reveals no gallop and no friction rub.   No murmur heard. Pulmonary/Chest: Effort normal and breath sounds  normal. No respiratory distress. She has no wheezes. She has no rales. She exhibits no tenderness.  Purse lip breathing Barrel chest  Abdominal: Soft. Bowel sounds are normal. She exhibits no distension and no mass. There is no tenderness. There is no rebound and no guarding.  Musculoskeletal: Normal range of motion. She exhibits no edema or tenderness.  Kyphotic  Lymphadenopathy:    She has no cervical adenopathy.  Neurological: She is alert and oriented to person, place, and time. She has normal reflexes. No cranial nerve deficit. She exhibits normal muscle tone. Coordination normal.  Skin: Skin is warm and dry. No rash noted. She is not diaphoretic. No erythema. No pallor.  Psychiatric: She has a normal mood and affect. Her behavior is normal. Judgment and thought content normal.  Vitals reviewed.    Filed Vitals:   07/01/14 1514  BP: 98/54  Pulse: 94  Height: 5\' 4"  (1.626 m)  Weight: 115 lb (52.164 kg)  SpO2: 93%        Assessment & Plan:     ICD-9-CM ICD-10-CM   1. Severe chronic obstructive pulmonary disease 496 J44.9   2. Chronic respiratory failure with hypoxia 518.83 J96.11    799.02    3. Physical deconditioning 799.3 R53.81   4. Smoking 305.1 Z72.0     #COPD with chronic resp failure Stable disease - means no flare up but you have declined over time Continue inhaers and o2 Continue daily prednisone 5mg   #Physical deconditioning - this has worsened significantly - Refer home PT program  #Smoking - advised to Quit smoking  followup 3 months iwht NP Tammy   Dr. Kalman ShanMurali Hutton Pellicane, M.D., [facility] Pulmonary and Critical Care Medicine Staff Physician [facility] Pager: [phone], If no answer or between  15:00h - 7:00h: call [phone]  07/01/2014 3:55 PM

## 2014-07-01 NOTE — Patient Instructions (Addendum)
ICD-9-CM ICD-10-CM   1. Severe chronic obstructive pulmonary disease 496 J44.9   2. Chronic respiratory failure with hypoxia 518.83 J96.11    799.02    3. Physical deconditioning 799.3 R53.81   4. Smoking 305.1 Z72.0    Stable disease - means no flare up but you have declined over time Continue inhaers and o2 Continue daily prednisone 5mg  Refer home PT program Quit smoking  followup 3 months iwht NP Tammy

## 2014-07-12 ENCOUNTER — Telehealth: Payer: Self-pay | Admitting: Internal Medicine

## 2014-07-12 NOTE — Telephone Encounter (Signed)
Called and gave verbal orders to [facility].    Nothing further needed.

## 2014-07-12 NOTE — Telephone Encounter (Signed)
Called and spoke with Duwayne Heck, requesting verbal orders for PT for 2 times weekly for 6 weeks. Danielle notified that MR is out of office until 6/20.  She would like the request to go to DOD.  MW - please advise.

## 2014-07-12 NOTE — Telephone Encounter (Signed)
Fine with me

## 2014-07-22 ENCOUNTER — Other Ambulatory Visit: Payer: Self-pay

## 2014-07-25 ENCOUNTER — Telehealth: Payer: Self-pay | Admitting: Internal Medicine

## 2014-07-25 NOTE — Telephone Encounter (Signed)
That is fine. Home PT order please

## 2014-07-25 NOTE — Telephone Encounter (Signed)
Harriett SineNancy (PT) called back, informed her that MR ok'ed order, she verbalized understanding. Nothing further needed

## 2014-07-25 NOTE — Telephone Encounter (Signed)
Requesting PT 4 times a week. Please advise MR thanks

## 2014-07-25 NOTE — Telephone Encounter (Signed)
lmomtcb x1 

## 2014-08-16 ENCOUNTER — Telehealth: Payer: Self-pay | Admitting: Internal Medicine

## 2014-08-16 NOTE — Telephone Encounter (Signed)
I called danielle and gave VO. Nothing further needed

## 2014-08-26 ENCOUNTER — Telehealth: Payer: Self-pay | Admitting: Internal Medicine

## 2014-08-26 NOTE — Telephone Encounter (Signed)
lmtcb x1 for Nancy. 

## 2014-08-27 NOTE — Telephone Encounter (Signed)
Spoke with Nicole Maxwell with Nicole Maxwell  She is needing VO for 2 more visits with pt to work on strengthening and energy conservation  I advised that this is okay  Nothing further needed

## 2014-08-27 NOTE — Telephone Encounter (Signed)
LMTCB x 2 for Nancy[facility]

## 2014-09-06 ENCOUNTER — Telehealth: Payer: Self-pay | Admitting: Internal Medicine

## 2014-09-06 NOTE — Telephone Encounter (Signed)
Nicole Maxwell from [facility] calling to obtain verbal order for physical therapy twice a week for 6 more weeks.  Patient is still sob but doing some better.  MR - please advise.

## 2014-09-08 NOTE — Telephone Encounter (Signed)
Ok refer PT

## 2014-09-09 NOTE — Telephone Encounter (Signed)
lmomtcb x1 

## 2014-09-10 NOTE — Telephone Encounter (Signed)
Called gave VO to [place]. nothing further needed

## 2014-10-02 ENCOUNTER — Ambulatory Visit (INDEPENDENT_AMBULATORY_CARE_PROVIDER_SITE_OTHER): Payer: Medicare Other | Admitting: Adult Health

## 2014-10-02 VITALS — BP 112/60 | HR 90 | Temp 98.2°F | Ht 64.0 in | Wt 110.2 lb

## 2014-10-02 DIAGNOSIS — J441 Chronic obstructive pulmonary disease with (acute) exacerbation: Secondary | ICD-10-CM

## 2014-10-02 DIAGNOSIS — J9611 Chronic respiratory failure with hypoxia: Secondary | ICD-10-CM | POA: Diagnosis not present

## 2014-10-02 MED ORDER — DOXYCYCLINE HYCLATE 100 MG PO TABS: 100.0000 mg | ORAL_TABLET | Freq: Two times a day (BID) | ORAL | Status: DC

## 2014-10-02 NOTE — Patient Instructions (Addendum)
Increase Prednisone  daily for 1 week then  daily for 1 week and then back to  daily .  Doxycycline  Twice daily  For 7 days. -wear sunscreen if out in sun .  Continue on Spiriva and Symbicort .  [facility] at [facility] planned.  follow up Dr. Marchelle Gearing in 3 months and As needed   Please contact office for sooner follow up if symptoms do not improve or worsen or seek emergency care

## 2014-10-03 NOTE — Assessment & Plan Note (Signed)
Cont on O2 .  

## 2014-10-03 NOTE — Assessment & Plan Note (Signed)
Flare   Plan  Increase Prednisone  daily for 1 week then  daily for 1 week and then back to  daily .  Doxycycline  Twice daily  For 7 days. -wear sunscreen if out in sun .  Continue on Spiriva and Symbicort .  Get Flu [facility] at [facility] as planned.  follow up Dr. Marchelle Gearing in 3 months and As needed   Please contact office for sooner follow up if symptoms do not improve or worsen or seek emergency care

## 2014-10-03 NOTE — Progress Notes (Signed)
Subjective:    Patient ID: Nicole Maxwell, female    DOB: July 29, 1945, 69 y.o.   MRN: [id number]  HPI HPI #COPD - Oxygen dependent since 2011  - Doubtamine stress echo 05/12/10 - normal, CXR July 2011 - clear  - PFTs 04/13/2010 - shows Gold stage 4 COPD with BD response/asthma component (fev1 0.5L/29%, 10% BD response on fev1 and 38% on FC), DLCO 27%)  - Spirometry Jan 2013 - fev1 0.74L/37%, RAtop 44 - shows gold stage 3 copd  - Class 2-3 exertional dyspnea with precipitants emotion, smoke, lying flat, weather change, pollen, odors  - Chronic cough with white sputjum  - CAT score - 23 in May 2013 - CAT score 19 in NOv 2013  #Smoking  #Chronic back pain - s/p lumbar fusion surgery ? Date  - Preop pulmonary evaluatio prior to spine surgery - 2013, surgery by Dr Danielle Dess never happend  #There is no history of allergies, aspirin allergies, asthma, bronchiolitis, CAD, DVT, a heart failure, PE, pneumonia  # Poor ability to understand questions and answer them. Difficulty following med advice  - noticed since march 2013  OV 01/11/2012    Presents with husband. Totally unclear again what her main issue is. ON CAT score is seems ithe baseline range of 18-23 with score today being 22. She admits to no changein baseline symptoms . She showed her med calendraw and swore she is on albuterol + atrovent scheduled + qvar mdi 2 puff bid. But she again points to [place] and spiriva as having worke well for her. But when I asked her if she wants to switch to them she has no answer. She says "well it is too costly". I asked her if nebss not working well for her and she could not answer the question one way or another.  Of note, she continues to attend rehab  Repeat pattern of not answering questions directly nor asking questions. Her husband too chided her for this. No one in office can understand what her concerns are. I have asked her to write questions down in future before doc visit   Still smoking:  Unable to quit   02/11/12 follow up  Returns for follow up and med review  pt brought all meds with her today.  c/o nebs being too expensive thru mailorder Pt gets confused very easily with detail. Unclear if she is taking med correctly .  She says mail order is very expensive for nebs . Inhalers were also too expensive .  She agreed to try DME to see if more cost effective No flare in cough or dyspnea.  No edema.  >>retrial of pulmicort   03/24/2012 Follow up COPD  Returns 6 week for her COPD Last visit. We restarted a trial of Pulmicort. Her nebulizers Unable to tolerate budesonide feels it make her worse.  She is Still smoking and does not have any interest in quitting smoking We discussed the dangers of smoking and also smoking around oxygen. She denies any hemoptysis, orthopnea, PND, or leg swelling She had a recent COPD flare with bronchitis. 3 weeks ago and was given prednisone and doxycycline, which seemed to help her symptoms.   OV 08/01/2012 FU COPD and smoking  Denies any acout or subacute symptomatology. We had her do med calendar and had her on nebs. But again, she says her copd meds are spiriva and symbicort; she brought these inhalers with her. I cannot figure out from her what her real meds are. She is keen  on getting samples.She says she quit smoking but then her last cigarette was this morning!!!    CAT COPD Symptom and Quality of Life Score (glax[employer] smith kline trademark)  06/11/11 10/08/2011  12/08/2011  01/11/2012  08/01/2012   Never Cough -> Cough all the time No phlegm in chest -> Chest is full of phlegm No chest tightness -> Chest feels very tight No dyspnea for 1 flight stairs/hill -> Very dyspneic for 1 flight of stairs No limitations for ADL at home -> Very limited with ADL at home Confident leaving home -> Not at all confident leaving home 5 1 0 2 2  Sleep soundly -> Do not sleep soundly because of lung  condition Lots of Energy -> No energy at all TOTAL Score (max 40)  05/16/14  Post hospital follow up : COPD and Chronic Resp Failure on O2 Pt returns for post hospital follow up .  Admitted March 22 through March 24 for acute on chronic respiratory failure with recurrent healthcare associated pneumonia. She had been admitted also in February for severe COPD exacerbation. She was treated with IV antibiotics. And transition to antibiotics. At discharge. She is feeling improved with decreased cough and shortness of breath. She does have some lingering cough and intermittent wheezing but this also is much better. CXR today showed cleared PNA.  She does continue to smoke. We discussed smoking cessation. She denies any hemoptysis, orthopnea, PND or leg swelling. Speech swallow eval showed mild dysphagia   10/02/14 Follow up : COPD and Chronic hypoxia RF on O2  Pt returns for 3 month follow up  Says she remains weak and gets winded with activiy Remains on O2 at 2lm rest and 3lm with act  Still smoking , discussed cessation .  More cough and congestion for last 2 weeks .  No fever, hemoptysis, edema or n/v/d.  Last cxr in April with copd changes .     Review of Systems Constitutional:   No  weight loss, night sweats,  Fevers, chills,  +fatigue, or  lassitude.  HEENT:   No headaches,  Difficulty swallowing,  Tooth/dental problems, or  Sore throat,                No sneezing, itching, ear ache,  +nasal congestion, post nasal drip,   CV:  No chest pain,  Orthopnea, PND, swelling in lower extremities, anasarca, dizziness, palpitations, syncope.   GI  No heartburn, indigestion, abdominal pain, nausea, vomiting, diarrhea, change in bowel habits, loss of appetite, bloody stools.   Resp:   No chest wall deformity  Skin: no rash or lesions.  GU: no dysuria, change in color of urine, no urgency or frequency.  No flank pain, no hematuria   MS:  No joint  pain or swelling.  No decreased range of motion.  No back pain.  Psych:  No change in mood or affect. No depression or anxiety.  No memory loss.      Objective:   Physical Exam GEN: A/Ox3; pleasant , NAD  HEENT:  [place]/AT,  EACs-clear, TMs-wnl, NOSE-clear, THROAT-clear, no lesions, no postnasal drip or exudate noted.   NECK:  Supple w/ fair ROM; no JVD; normal carotid impulses w/o  bruits; no thyromegaly or nodules palpated; no lymphadenopathy.  RESP  Decreased BS in bases w/ no wheezingno accessory muscle use, no dullness to percussion  CARD:  RRR, no m/r/g  , no peripheral edema, pulses intact, no cyanosis or clubbing.  GI:   Soft & nt; nml bowel sounds; no organomegaly or masses detected.  Musco: Warm bil, no deformities or joint swelling noted.   Neuro: alert, no focal deficits noted.    Skin: Warm, no lesions or rashes         Assessment & Plan:

## 2014-10-04 ENCOUNTER — Other Ambulatory Visit: Payer: Self-pay | Admitting: Internal Medicine

## 2014-10-04 ENCOUNTER — Telehealth: Payer: Self-pay | Admitting: Internal Medicine

## 2014-10-04 MED ORDER — PREDNISONE 5 MG PO TABS: 5.0000 mg | ORAL_TABLET | Freq: Every day | ORAL | Status: DC

## 2014-10-04 MED ORDER — PREDNISONE 10 MG PO TABS: ORAL_TABLET | ORAL | Status: DC

## 2014-10-04 NOTE — Telephone Encounter (Signed)
Spoke with pt's daughter, Nicole Maxwell.  Pt was seen 10/02/14 by TP with the following instructions:  Patient Instructions     Increase Prednisone  daily for 1 week then  daily for 1 week and then back to  daily .  Doxycycline  Twice daily For 7 days. -wear sunscreen if out in sun .  Continue on Spiriva and Symbicort .  [facility].  follow up Dr. Marchelle Gearing in 3 months and As needed  Please contact office for sooner follow up if symptoms do not improve or worsen or seek emergency care    -----  Daughter is requesting rx for prednisone to carry her through the pred taper.  She would like 10 mg tablets for pt to take for the first 2 wks at the  and 10 mg dosage and a 5 mg rx for when pt goes back to once daily.  Rxs sent to pharmacy.  Nicole Maxwell will ensure pt is aware there will be a rx for both prednisone 5 mg and 10 mg rxs.

## 2014-11-26 ENCOUNTER — Inpatient Hospital Stay (HOSPITAL_COMMUNITY)
Admission: EM | Admit: 2014-11-26 | Discharge: 2014-12-12 | DRG: 208 | Disposition: A | Discharge status: Skilled Nursing Facility | Payer: Medicare Other | Attending: Pulmonary Disease | Admitting: Pulmonary Disease

## 2014-11-26 ENCOUNTER — Encounter (HOSPITAL_COMMUNITY): Payer: Self-pay | Admitting: Emergency Medicine

## 2014-11-26 ENCOUNTER — Emergency Department (HOSPITAL_COMMUNITY): Payer: Medicare Other

## 2014-11-26 DIAGNOSIS — B965 Pseudomonas (aeruginosa) (mallei) (pseudomallei) as the cause of diseases classified elsewhere: Secondary | ICD-10-CM | POA: Diagnosis present

## 2014-11-26 DIAGNOSIS — G9340 Encephalopathy, unspecified: Secondary | ICD-10-CM | POA: Diagnosis not present

## 2014-11-26 DIAGNOSIS — I110 Hypertensive heart disease with heart failure: Secondary | ICD-10-CM | POA: Diagnosis present

## 2014-11-26 DIAGNOSIS — Z794 Long term (current) use of insulin: Secondary | ICD-10-CM

## 2014-11-26 DIAGNOSIS — Z9842 Cataract extraction status, left eye: Secondary | ICD-10-CM

## 2014-11-26 DIAGNOSIS — I9589 Other hypotension: Secondary | ICD-10-CM | POA: Diagnosis not present

## 2014-11-26 DIAGNOSIS — Z7984 Long term (current) use of oral hypoglycemic drugs: Secondary | ICD-10-CM

## 2014-11-26 DIAGNOSIS — M549 Dorsalgia, unspecified: Secondary | ICD-10-CM | POA: Diagnosis present

## 2014-11-26 DIAGNOSIS — J208 Acute bronchitis due to other specified organisms: Secondary | ICD-10-CM | POA: Diagnosis present

## 2014-11-26 DIAGNOSIS — E876 Hypokalemia: Secondary | ICD-10-CM | POA: Diagnosis present

## 2014-11-26 DIAGNOSIS — F419 Anxiety disorder, unspecified: Secondary | ICD-10-CM | POA: Diagnosis present

## 2014-11-26 DIAGNOSIS — E440 Moderate protein-calorie malnutrition: Secondary | ICD-10-CM | POA: Diagnosis present

## 2014-11-26 DIAGNOSIS — J9601 Acute respiratory failure with hypoxia: Secondary | ICD-10-CM

## 2014-11-26 DIAGNOSIS — Z681 Body mass index (BMI) 19 or less, adult: Secondary | ICD-10-CM

## 2014-11-26 DIAGNOSIS — D72829 Elevated white blood cell count, unspecified: Secondary | ICD-10-CM | POA: Diagnosis not present

## 2014-11-26 DIAGNOSIS — Z9841 Cataract extraction status, right eye: Secondary | ICD-10-CM

## 2014-11-26 DIAGNOSIS — Z7982 Long term (current) use of aspirin: Secondary | ICD-10-CM | POA: Diagnosis not present

## 2014-11-26 DIAGNOSIS — I10 Essential (primary) hypertension: Secondary | ICD-10-CM

## 2014-11-26 DIAGNOSIS — R109 Unspecified abdominal pain: Secondary | ICD-10-CM

## 2014-11-26 DIAGNOSIS — I739 Peripheral vascular disease, unspecified: Secondary | ICD-10-CM | POA: Diagnosis present

## 2014-11-26 DIAGNOSIS — E1122 Type 2 diabetes mellitus with diabetic chronic kidney disease: Secondary | ICD-10-CM | POA: Diagnosis not present

## 2014-11-26 DIAGNOSIS — J9621 Acute and chronic respiratory failure with hypoxia: Secondary | ICD-10-CM | POA: Diagnosis present

## 2014-11-26 DIAGNOSIS — I5032 Chronic diastolic (congestive) heart failure: Secondary | ICD-10-CM | POA: Diagnosis not present

## 2014-11-26 DIAGNOSIS — I5033 Acute on chronic diastolic (congestive) heart failure: Secondary | ICD-10-CM | POA: Diagnosis present

## 2014-11-26 DIAGNOSIS — J441 Chronic obstructive pulmonary disease with (acute) exacerbation: Principal | ICD-10-CM | POA: Diagnosis present

## 2014-11-26 DIAGNOSIS — M8580 Other specified disorders of bone density and structure, unspecified site: Secondary | ICD-10-CM | POA: Diagnosis present

## 2014-11-26 DIAGNOSIS — F17210 Nicotine dependence, cigarettes, uncomplicated: Secondary | ICD-10-CM | POA: Diagnosis present

## 2014-11-26 DIAGNOSIS — T85598D Other mechanical complication of other gastrointestinal prosthetic devices, implants and grafts, subsequent encounter: Secondary | ICD-10-CM

## 2014-11-26 DIAGNOSIS — T380X5A Adverse effect of glucocorticoids and synthetic analogues, initial encounter: Secondary | ICD-10-CM | POA: Diagnosis present

## 2014-11-26 DIAGNOSIS — Z7952 Long term (current) use of systemic steroids: Secondary | ICD-10-CM

## 2014-11-26 DIAGNOSIS — J9622 Acute and chronic respiratory failure with hypercapnia: Secondary | ICD-10-CM | POA: Diagnosis present

## 2014-11-26 DIAGNOSIS — Z9981 Dependence on supplemental oxygen: Secondary | ICD-10-CM

## 2014-11-26 DIAGNOSIS — N183 Chronic kidney disease, stage 3 (moderate): Secondary | ICD-10-CM | POA: Diagnosis not present

## 2014-11-26 DIAGNOSIS — J9602 Acute respiratory failure with hypercapnia: Secondary | ICD-10-CM | POA: Diagnosis present

## 2014-11-26 DIAGNOSIS — Z981 Arthrodesis status: Secondary | ICD-10-CM | POA: Diagnosis not present

## 2014-11-26 DIAGNOSIS — Z8249 Family history of ischemic heart disease and other diseases of the circulatory system: Secondary | ICD-10-CM | POA: Diagnosis not present

## 2014-11-26 DIAGNOSIS — J9600 Acute respiratory failure, unspecified whether with hypoxia or hypercapnia: Secondary | ICD-10-CM

## 2014-11-26 DIAGNOSIS — E785 Hyperlipidemia, unspecified: Secondary | ICD-10-CM | POA: Diagnosis present

## 2014-11-26 DIAGNOSIS — R0602 Shortness of breath: Secondary | ICD-10-CM | POA: Diagnosis present

## 2014-11-26 DIAGNOSIS — Z961 Presence of intraocular lens: Secondary | ICD-10-CM | POA: Diagnosis present

## 2014-11-26 DIAGNOSIS — M1990 Unspecified osteoarthritis, unspecified site: Secondary | ICD-10-CM | POA: Diagnosis present

## 2014-11-26 DIAGNOSIS — F17200 Nicotine dependence, unspecified, uncomplicated: Secondary | ICD-10-CM | POA: Diagnosis present

## 2014-11-26 DIAGNOSIS — Z9119 Patient's noncompliance with other medical treatment and regimen: Secondary | ICD-10-CM | POA: Diagnosis not present

## 2014-11-26 DIAGNOSIS — G8929 Other chronic pain: Secondary | ICD-10-CM | POA: Diagnosis present

## 2014-11-26 DIAGNOSIS — E1165 Type 2 diabetes mellitus with hyperglycemia: Secondary | ICD-10-CM | POA: Diagnosis present

## 2014-11-26 DIAGNOSIS — E873 Alkalosis: Secondary | ICD-10-CM | POA: Diagnosis not present

## 2014-11-26 DIAGNOSIS — E119 Type 2 diabetes mellitus without complications: Secondary | ICD-10-CM

## 2014-11-26 DIAGNOSIS — D649 Anemia, unspecified: Secondary | ICD-10-CM | POA: Diagnosis present

## 2014-11-26 DIAGNOSIS — E1100 Type 2 diabetes mellitus with hyperosmolarity without nonketotic hyperglycemic-hyperosmolar coma (NKHHC): Secondary | ICD-10-CM | POA: Diagnosis not present

## 2014-11-26 DIAGNOSIS — J440 Chronic obstructive pulmonary disease with acute lower respiratory infection: Secondary | ICD-10-CM | POA: Diagnosis present

## 2014-11-26 DIAGNOSIS — Z79899 Other long term (current) drug therapy: Secondary | ICD-10-CM

## 2014-11-26 DIAGNOSIS — Z4659 Encounter for fitting and adjustment of other gastrointestinal appliance and device: Secondary | ICD-10-CM

## 2014-11-26 LAB — CBC WITH DIFFERENTIAL/PLATELET
BASOS PCT: 0 %
Basophils Absolute: 0 10*3/uL (ref 0.0–0.1)
EOS PCT: 1 %
Eosinophils Absolute: 0.1 10*3/uL (ref 0.0–0.7)
HEMATOCRIT: 35.9 % — AB (ref 36.0–46.0)
Hemoglobin: 10.9 g/dL — ABNORMAL LOW (ref 12.0–15.0)
Lymphocytes Relative: 11 %
Lymphs Abs: 1.7 10*3/uL (ref 0.7–4.0)
MCH: 27 pg (ref 26.0–34.0)
MCHC: 30.4 g/dL (ref 30.0–36.0)
MCV: 88.9 fL (ref 78.0–100.0)
MONO ABS: 0.8 10*3/uL (ref 0.1–1.0)
MONOS PCT: 5 %
NEUTROS ABS: 12.9 10*3/uL — AB (ref 1.7–7.7)
Neutrophils Relative %: 83 %
Platelets: 244 10*3/uL (ref 150–400)
RBC: 4.04 MIL/uL (ref 3.87–5.11)
RDW: 14.1 % (ref 11.5–15.5)
WBC: 15.5 10*3/uL — ABNORMAL HIGH (ref 4.0–10.5)

## 2014-11-26 LAB — BLOOD GAS, ARTERIAL
ACID-BASE EXCESS: 4.3 mmol/L — AB (ref 0.0–2.0)
Acid-Base Excess: 5.8 mmol/L — ABNORMAL HIGH (ref 0.0–2.0)
BICARBONATE: 32.9 meq/L — AB (ref 20.0–24.0)
Bicarbonate: 33.4 mEq/L — ABNORMAL HIGH (ref 20.0–24.0)
Delivery systems: POSITIVE
Drawn by: 295031
Drawn by: 276051
Expiratory PAP: 5
FIO2: 0.3
INSPIRATORY PAP: 12
O2 CONTENT: 4 L/min
O2 SAT: 94.2 %
O2 SAT: 94.7 %
PATIENT TEMPERATURE: 98.6
PATIENT TEMPERATURE: 37
PCO2 ART: 81.2 mmHg — AB (ref 35.0–45.0)
PCO2 ART: 64.7 mmHg — AB (ref 35.0–45.0)
PH ART: 7.238 — AB (ref 7.350–7.450)
RATE: 8 resp/min
TCO2: 31.8 mmol/L (ref 0–100)
TCO2: 30.8 mmol/L (ref 0–100)
pH, Arterial: 7.326 — ABNORMAL LOW (ref 7.350–7.450)
pO2, Arterial: 84.5 mmHg (ref 80.0–100.0)
pO2, Arterial: 77.8 mmHg — ABNORMAL LOW (ref 80.0–100.0)

## 2014-11-26 LAB — BASIC METABOLIC PANEL
ANION GAP: 7 (ref 5–15)
BUN: 14 mg/dL (ref 6–20)
CALCIUM: 8.6 mg/dL — AB (ref 8.9–10.3)
CO2: 35 mmol/L — ABNORMAL HIGH (ref 22–32)
Chloride: 100 mmol/L — ABNORMAL LOW (ref 101–111)
Creatinine, Ser: 0.55 mg/dL (ref 0.44–1.00)
GFR calc Af Amer: >60 mL/min (ref >60)
GFR calc non Af Amer: >60 mL/min (ref >60)
GLUCOSE: 160 mg/dL — AB (ref 65–99)
Potassium: 3.6 mmol/L (ref 3.5–5.1)
Sodium: 142 mmol/L (ref 135–145)

## 2014-11-26 LAB — GLUCOSE, CAPILLARY
Glucose-Capillary: 237 mg/dL — ABNORMAL HIGH (ref 65–99)
Glucose-Capillary: 92 mg/dL (ref 65–99)

## 2014-11-26 LAB — INFLUENZA PANEL BY PCR (TYPE A & B)
H1N1FLUPCR: NOT DETECTED
Influenza A By PCR: NEGATIVE
Influenza B By PCR: NEGATIVE

## 2014-11-26 LAB — PROTIME-INR
INR: 0.93 (ref 0.00–1.49)
Prothrombin Time: 12.7 seconds (ref 11.6–15.2)

## 2014-11-26 LAB — PROCALCITONIN

## 2014-11-26 LAB — APTT: APTT: 26 s (ref 24–37)

## 2014-11-26 LAB — I-STAT CG4 LACTIC ACID, ED: Lactic Acid, Venous: 0.66 mmol/L (ref 0.5–2.0)

## 2014-11-26 LAB — LACTIC ACID, PLASMA
LACTIC ACID, VENOUS: 1.7 mmol/L (ref 0.5–2.0)
Lactic Acid, Venous: 1.3 mmol/L (ref 0.5–2.0)

## 2014-11-26 LAB — MRSA PCR SCREENING: MRSA by PCR: NEGATIVE

## 2014-11-26 MED ORDER — ALBUTEROL SULFATE (2.5 MG/3ML) 0.083% IN NEBU
2.5000 mg | INHALATION_SOLUTION | RESPIRATORY_TRACT | Status: DC | PRN
  Administered 2014-11-28: 2.5 mg via RESPIRATORY_TRACT
  Filled 2014-11-26: qty 3

## 2014-11-26 MED ORDER — SODIUM CHLORIDE 0.9 % IV BOLUS (SEPSIS)
1000.0000 mL | Freq: Once | INTRAVENOUS | Status: AC
  Administered 2014-11-26: 1000 mL via INTRAVENOUS

## 2014-11-26 MED ORDER — HEPARIN SODIUM (PORCINE) 5000 UNIT/ML IJ SOLN
5000.0000 [IU] | Freq: Three times a day (TID) | INTRAMUSCULAR | Status: DC
Start: 2014-11-26 — End: 2014-11-29
  Administered 2014-11-26 – 2014-11-28 (×7): 5000 [IU] via SUBCUTANEOUS
  Filled 2014-11-26 (×7): qty 1

## 2014-11-26 MED ORDER — ALBUTEROL SULFATE (2.5 MG/3ML) 0.083% IN NEBU: 2.5000 mg | INHALATION_SOLUTION | RESPIRATORY_TRACT | Status: DC | PRN

## 2014-11-26 MED ORDER — ALBUTEROL SULFATE (2.5 MG/3ML) 0.083% IN NEBU
2.5000 mg | INHALATION_SOLUTION | RESPIRATORY_TRACT | Status: DC
  Administered 2014-11-26 – 2014-11-27 (×5): 2.5 mg via RESPIRATORY_TRACT
  Filled 2014-11-26 (×5): qty 3

## 2014-11-26 MED ORDER — LEVOFLOXACIN IN D5W 750 MG/150ML IV SOLN: 750.0000 mg | INTRAVENOUS | Status: DC

## 2014-11-26 MED ORDER — FUROSEMIDE 40 MG PO TABS
40.0000 mg | ORAL_TABLET | Freq: Every day | ORAL | Status: DC
  Administered 2014-11-27: 40 mg via ORAL
  Filled 2014-11-26: qty 1

## 2014-11-26 MED ORDER — INSULIN ASPART 100 UNIT/ML ~~LOC~~ SOLN
0.0000 [IU] | SUBCUTANEOUS | Status: DC
  Administered 2014-11-26: 5 [IU] via SUBCUTANEOUS

## 2014-11-26 MED ORDER — HYDROCODONE-ACETAMINOPHEN 5-325 MG PO TABS: 1.0000 | ORAL_TABLET | Freq: Four times a day (QID) | ORAL | Status: DC | PRN

## 2014-11-26 MED ORDER — ONDANSETRON HCL 4 MG PO TABS
4.0000 mg | ORAL_TABLET | Freq: Four times a day (QID) | ORAL | Status: DC | PRN
  Administered 2014-11-30: 4 mg via ORAL
  Filled 2014-11-26: qty 1

## 2014-11-26 MED ORDER — ALBUTEROL (5 MG/ML) CONTINUOUS INHALATION SOLN
10.0000 mg/h | INHALATION_SOLUTION | RESPIRATORY_TRACT | Status: DC
  Administered 2014-11-26: 10 mg/h via RESPIRATORY_TRACT
  Filled 2014-11-26: qty 20

## 2014-11-26 MED ORDER — TIOTROPIUM BROMIDE MONOHYDRATE 18 MCG IN CAPS
18.0000 ug | ORAL_CAPSULE | Freq: Every day | RESPIRATORY_TRACT | Status: DC
  Filled 2014-11-26: qty 5

## 2014-11-26 MED ORDER — SODIUM CHLORIDE 0.9 % IJ SOLN
3.0000 mL | Freq: Two times a day (BID) | INTRAMUSCULAR | Status: DC
  Administered 2014-11-27 – 2014-12-12 (×28): 3 mL via INTRAVENOUS

## 2014-11-26 MED ORDER — METHYLPREDNISOLONE SODIUM SUCC 125 MG IJ SOLR
80.0000 mg | Freq: Four times a day (QID) | INTRAMUSCULAR | Status: DC
  Administered 2014-11-26 – 2014-11-28 (×8): 80 mg via INTRAVENOUS
  Filled 2014-11-26 (×8): qty 2

## 2014-11-26 MED ORDER — ALPRAZOLAM 1 MG PO TABS
1.0000 mg | ORAL_TABLET | Freq: Three times a day (TID) | ORAL | Status: DC | PRN
  Administered 2014-11-26 – 2014-11-27 (×2): 1 mg via ORAL
  Administered 2014-11-27 – 2014-11-30 (×4): 2 mg via ORAL
  Administered 2014-11-30 – 2014-12-01 (×2): 1 mg via ORAL
  Filled 2014-11-26 (×2): qty 2
  Filled 2014-11-26: qty 1
  Filled 2014-11-26 (×2): qty 2
  Filled 2014-11-26 (×3): qty 1
  Filled 2014-11-26: qty 2

## 2014-11-26 MED ORDER — ONDANSETRON HCL 4 MG/2ML IJ SOLN: 4.0000 mg | Freq: Three times a day (TID) | INTRAMUSCULAR | Status: DC | PRN

## 2014-11-26 MED ORDER — BUDESONIDE-FORMOTEROL FUMARATE 160-4.5 MCG/ACT IN AERO
2.0000 | INHALATION_SPRAY | Freq: Two times a day (BID) | RESPIRATORY_TRACT | Status: DC
  Administered 2014-11-26 – 2014-12-01 (×10): 2 via RESPIRATORY_TRACT
  Filled 2014-11-26: qty 6

## 2014-11-26 MED ORDER — PANTOPRAZOLE SODIUM 40 MG PO TBEC
40.0000 mg | DELAYED_RELEASE_TABLET | Freq: Every day | ORAL | Status: DC
  Administered 2014-11-27 – 2014-12-01 (×5): 40 mg via ORAL
  Filled 2014-11-26 (×5): qty 1

## 2014-11-26 MED ORDER — IPRATROPIUM BROMIDE 0.02 % IN SOLN
0.5000 mg | Freq: Once | RESPIRATORY_TRACT | Status: DC
Start: 2014-11-26 — End: 2014-11-26

## 2014-11-26 MED ORDER — ALBUTEROL SULFATE (2.5 MG/3ML) 0.083% IN NEBU: 5.0000 mg | INHALATION_SOLUTION | Freq: Once | RESPIRATORY_TRACT | Status: DC

## 2014-11-26 MED ORDER — ASPIRIN EC 81 MG PO TBEC
81.0000 mg | DELAYED_RELEASE_TABLET | Freq: Every day | ORAL | Status: DC
  Administered 2014-11-27 – 2014-12-03 (×7): 81 mg via ORAL
  Filled 2014-11-26 (×7): qty 1

## 2014-11-26 MED ORDER — DOXYCYCLINE HYCLATE 100 MG IV SOLR
100.0000 mg | Freq: Two times a day (BID) | INTRAVENOUS | Status: DC
  Administered 2014-11-26 – 2014-11-28 (×4): 100 mg via INTRAVENOUS
  Filled 2014-11-26 (×5): qty 100

## 2014-11-26 MED ORDER — INSULIN DETEMIR 100 UNIT/ML ~~LOC~~ SOLN
5.0000 [IU] | Freq: Every day | SUBCUTANEOUS | Status: DC
  Administered 2014-11-27 – 2014-11-30 (×5): 5 [IU] via SUBCUTANEOUS
  Filled 2014-11-26 (×6): qty 0.05

## 2014-11-26 MED ORDER — POLYETHYLENE GLYCOL 3350 17 G PO PACK
17.0000 g | PACK | Freq: Every day | ORAL | Status: DC | PRN
  Administered 2014-11-29: 17 g via ORAL
  Filled 2014-11-26: qty 1

## 2014-11-26 MED ORDER — ATORVASTATIN CALCIUM 10 MG PO TABS
20.0000 mg | ORAL_TABLET | Freq: Every day | ORAL | Status: DC
  Administered 2014-11-27 – 2014-12-02 (×5): 20 mg via ORAL
  Filled 2014-11-26 (×6): qty 2

## 2014-11-26 MED ORDER — IRBESARTAN 150 MG PO TABS
150.0000 mg | ORAL_TABLET | Freq: Every day | ORAL | Status: DC
  Administered 2014-11-27 – 2014-12-01 (×5): 150 mg via ORAL
  Filled 2014-11-26 (×8): qty 1

## 2014-11-26 MED ORDER — HYDROCODONE-ACETAMINOPHEN 5-325 MG PO TABS
1.0000 | ORAL_TABLET | ORAL | Status: DC | PRN
  Administered 2014-11-28: 1 via ORAL
  Administered 2014-11-29 – 2014-11-30 (×2): 2 via ORAL
  Administered 2014-12-01: 1 via ORAL
  Filled 2014-11-26: qty 2
  Filled 2014-11-26 (×2): qty 1
  Filled 2014-11-26: qty 2
  Filled 2014-11-26 (×2): qty 1

## 2014-11-26 MED ORDER — ALBUTEROL SULFATE (2.5 MG/3ML) 0.083% IN NEBU
2.5000 mg | INHALATION_SOLUTION | Freq: Once | RESPIRATORY_TRACT | Status: AC
  Administered 2014-11-26: 2.5 mg via RESPIRATORY_TRACT
  Filled 2014-11-26: qty 3

## 2014-11-26 MED ORDER — IPRATROPIUM-ALBUTEROL 0.5-2.5 (3) MG/3ML IN SOLN
3.0000 mL | Freq: Once | RESPIRATORY_TRACT | Status: AC
  Administered 2014-11-26: 3 mL via RESPIRATORY_TRACT
  Filled 2014-11-26: qty 3

## 2014-11-26 MED ORDER — ONDANSETRON HCL 4 MG/2ML IJ SOLN
4.0000 mg | Freq: Four times a day (QID) | INTRAMUSCULAR | Status: DC | PRN
  Administered 2014-11-30: 4 mg via INTRAVENOUS
  Filled 2014-11-26 (×3): qty 2

## 2014-11-26 MED ORDER — SODIUM CHLORIDE 0.9 % IV SOLN
INTRAVENOUS | Status: AC
  Administered 2014-11-26: 75 mL/h via INTRAVENOUS

## 2014-11-26 NOTE — ED Notes (Signed)
MD at bedside. 

## 2014-11-26 NOTE — ED Notes (Signed)
Respiratory at bedside.

## 2014-11-26 NOTE — ED Notes (Signed)
Bed: WA05 Expected date:  Expected time:  Means of arrival:  Comments: Ems / sob  

## 2014-11-26 NOTE — Progress Notes (Signed)
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

## 2014-11-26 NOTE — ED Provider Notes (Signed)
CSN: [id number]     Arrival date & time 11/26/14  4098 History   First MD Initiated Contact with Patient 11/26/14 [phone]     Chief Complaint  Patient presents with  . Shortness of Breath     (Consider location/radiation/quality/duration/timing/severity/associated sxs/prior Treatment) The history is provided by the patient. The history is limited by the condition of the patient (Pt extremely dyspneic ).     Patient is a 69 year old female, current smoker, with history of COPD, maintained on home oxygen, 2 L [place] the past 5 years, is brought into the ER via EMS for worsening shortness of breath over the past 3 days. She has tried multiple albuterol nebulizers at home without any improvement.  She has worsening productive cough with increasing sputum, green to brown in color.  She endorses subjective fever, chills, sweats.  She states she has been able to continue smoking over the last 3 days despite the difficulty breathing.  She states is been difficult to get around her home much due to her shortness of breath.  She denies abdominal pain, nausea, vomiting, syncope, and chest pain.  Past Medical History  Diagnosis Date  . Hypertension   . Chronic back pain     "goes from the lower part of my back on down my legs"  (04/17/2014)  . Osteopenia   . PVD (peripheral vascular disease) (HCC)   . Tobacco abuse     " I am addicted"   . Vitamin D deficiency disease     03/26/2010 - 9.9ng/mL. Started on replacemnt  . Alcohol abuse, in remission quit 2004  . Neuromuscular disorder (HCC)     lumbar disc  . Anxiety     Xanax prn  . Shortness of breath     with anxiety  and activty  . Asthma   . Hyperlipidemia   . On home oxygen therapy     "2L; suppose to be 24/7" (04/17/2014)  . COPD (chronic obstructive pulmonary disease) (HCC)     cxr 04/03/2010 - hyperinflation  . Pneumonia     "3 times in the last month or so" (04/17/2014)  . Type II diabetes mellitus (HCC)   . Arthritis     "my whole body"    Past Surgical History  Procedure Laterality Date  . Lumbar laminectomy/decompression microdiscectomy  2005    L4-5/notes 03/06/2010  . Appendectomy  1960  . Tonsillectomy  1960  . Posterior lumbar fusion  2002  . Abdominal hysterectomy      "partial"  . Back surgery    . Carpal tunnel release Bilateral     CTS repair 2000 (R), 2006 (L)/notes 01/01/2014  . Cataract extraction w/ intraocular lens  implant, bilateral Bilateral 2015   Family History  Problem Relation Age of Onset  . Heart attack Sister 66    CABG  . Breast cancer Sister   . Cancer Brother   . Heart attack Brother   . Hypertension Other     all siblings  . Anesthesia problems Neg Hx    Social History  Substance Use Topics  . Smoking status: Current Every Day Smoker -- 0.25 packs/day for 56 years    Types: Cigarettes  . Smokeless tobacco: Never Used  . Alcohol Use: Yes     Comment: h/o heavy ETOH quit 2004   OB History    No data available     Review of Systems  Reason unable to perform ROS: ROS limited by respiratory distress.  Allergies  Brovana and Budesonide  Home Medications   Prior to Admission medications   Medication Sig Start Date End Date Taking? Authorizing Provider  acetaminophen (TYLENOL) 500 MG tablet Take 500-1,000 mg by mouth every 8 (eight) hours as needed (for pain).    Historical Provider, MD  albuterol (PROVENTIL HFA;VENTOLIN HFA) 108 (90 BASE) MCG/ACT inhaler Inhale 2 puffs into the lungs every 4 (four) hours as needed for wheezing or shortness of breath.     Historical Provider, MD  albuterol (PROVENTIL) (2.5 MG/3ML) 0.083% nebulizer solution Take 3 mLs (2.5 mg total) by nebulization every 4 (four) hours as needed for wheezing or shortness of breath. (may take 1 every 4 hours as needed for wheezing/shortness of breath) DX: 496 01/09/14   [name], MD  alprazolam Prudy Feeler) 2 MG tablet Take 0.5-1 tablets (1-2 mg total) by mouth 3 (three) times daily as needed for anxiety.  04/18/14   Dorothea Ogle, MD  aspirin EC 81 MG EC tablet Take 1 tablet (81 mg total) by mouth daily. 01/09/14   [name], MD  atorvastatin (LIPITOR) 20 MG tablet Take 1 tablet daily 09/14/14   Historical Provider, MD  budesonide-formoterol (SYMBICORT) 160-4.5 MCG/ACT inhaler Inhale 2 puffs into the lungs 2 (two) times daily. 04/18/14   Dorothea Ogle, MD  dextromethorphan-guaiFENesin (MUCINEX DM) 30-600 MG per 12 hr tablet Take 1 tablet by mouth 2 (two) times daily. Patient taking differently: Take 2 tablets by mouth 2 (two) times daily.  01/09/14   [name], MD  doxycycline (VIBRA-TABS) 100 MG tablet Take 1 tablet (100 mg total) by mouth 2 (two) times daily. 10/02/14   Tammy S Parrett, NP  fluticasone (FLONASE) 50 MCG/ACT nasal spray Place 1 spray into both nostrils daily.    Historical Provider, MD  furosemide (LASIX) 40 MG tablet Take 1 tablet (40 mg total) by mouth daily. 01/09/14   [name], MD  HYDROcodone-acetaminophen (NORCO/VICODIN) 5-325 MG per tablet Take 1 tablet by mouth every 6 (six) hours as needed for moderate pain. 04/18/14   Dorothea Ogle, MD  irbesartan (AVAPRO) 150 MG tablet Take 1 tablet (150 mg total) by mouth daily. 01/09/14   [name], MD  metFORMIN (GLUCOPHAGE) 500 MG tablet Take 500 mg by mouth 2 (two) times daily with a meal.      Historical Provider, MD  Multiple Vitamin (MULTIVITAMIN) tablet Take 1 tablet by mouth daily.    Historical Provider, MD  pantoprazole (PROTONIX) 40 MG tablet Take 1 tablet (40 mg total) by mouth daily at 6 (six) AM. 01/09/14   [name], MD  predniSONE (DELTASONE) 10 MG tablet  daily for 1 week then  daily for 1 week and then back to  daily 10/04/14   Tammy S Parrett, NP  predniSONE (DELTASONE) 5 MG tablet Take 1 tablet (5 mg total) by mouth daily. After finishing prednisone taper 10/04/14   Tammy S Parrett, NP  tiotropium (SPIRIVA) 18 MCG inhalation capsule Place 1 capsule (18 mcg total) into inhaler and inhale daily. 04/09/12    Jarome Matin, MD   There were no vitals taken for this visit. Physical Exam  Constitutional: She is oriented to person, place, and time. She appears well-developed and well-nourished. She appears distressed.  Thin elderly female, appears distressed with increased work of breathing, non-toxic appearing  HENT:  Head: Normocephalic and atraumatic.  Nose: Nose normal.  Mouth/Throat: Oropharynx is clear and moist. No oropharyngeal exudate.  Eyes: Conjunctivae and EOM are normal. Pupils are equal,  round, and reactive to light. Right eye exhibits no discharge. Left eye exhibits no discharge. No scleral icterus.  Neck: Normal range of motion. No JVD present. No tracheal deviation present. No thyromegaly present.  Cardiovascular: Regular rhythm, normal heart sounds and intact distal pulses.  Tachycardia present.  Exam reveals no gallop and no friction rub.   No murmur heard. Pulmonary/Chest: Accessory muscle usage present. Tachypnea noted. She is in respiratory distress. She has no decreased breath sounds. She has wheezes. She has no rales. She exhibits no tenderness.  Inspiratory and expiratory wheeze throughout all lung fields with scattered rhonchi Suprasternal and supraclavicular retractions, no cyanosis  Abdominal: Soft. Bowel sounds are normal. She exhibits no distension and no mass. There is no tenderness. There is no rebound and no guarding.  Musculoskeletal: Normal range of motion. She exhibits no edema or tenderness.  Lymphadenopathy:    She has no cervical adenopathy.  Neurological: She is alert and oriented to person, place, and time. She has normal reflexes. No cranial nerve deficit. She exhibits normal muscle tone. Coordination normal.  Skin: Skin is warm and dry. No rash noted. She is not diaphoretic. No erythema. No pallor.  Psychiatric: She has a normal mood and affect. Her behavior is normal. Judgment and thought content normal.  Nursing note and vitals reviewed.   ED Course   Procedures (including critical care time) Labs Review Labs Reviewed - No data to display  Imaging Review No results found. I have personally reviewed and evaluated these images and lab results as part of my medical decision-making.   EKG Interpretation None      MDM   Final diagnoses:  None    Pt with SOB, increased WOB, wheeze, with hx of COPD on 2L [place] O2 for the past 5 years Pt was given 125 mg solumedrol and 2 breathing treatments by EMS, arrives on 2nd treatment for a total of 10 mg of albuterol.  Pt will likely be admitted for acute exacerbation of COPD.  The portable CXR shows stable coarse interstitial marking, no pleural effusion, no pneumothorax, no alveolar infiltrate.  CBC, BMP, respiratory panel, BCx2 ordered (1 set obtained, BC were ordered due to pt feeling hot to the touch, complaints of fever, sweats, chills at home.  Pt reevaluated after completion of first 2 breathing treatments - continued insp/exp wheeze with rhonchi, pt with continued work of breathing, RR  >30, maintaining O2 sats.  RT Wheeze protocol is ordered and pending.  CAT ordered  Pt reevaluated after CAT, improved wheeze, now prolonged expiratory wheeze Pt 100% on 4L O2, lowered to 2L to monitor response.      Hospitalist was consulted for admission.  He added ABG, will come to see pt.   Temp orders entered - Dr. Lambert Keto to admit pt to stepdown.   CRITICAL CARE Performed by: Danelle Berry Total critical care time: 60  Critical care time was exclusive of separately billable procedures and treating other patients. Critical care was necessary to treat or prevent imminent or life-threatening deterioration. Critical care was time spent personally by me on the following activities: development of treatment plan with patient and/or surrogate as well as nursing, discussions with consultants, evaluation of patient's response to treatment, examination of patient, obtaining history from patient or  surrogate, ordering and performing treatments and interventions, ordering and review of laboratory studies, ordering and review of radiographic studies, pulse oximetry and re-evaluation of patient's condition.    Danelle Berry, PA-C 12/16/14 0444  Leta Baptist,  MD 12/16/14 2225

## 2014-11-26 NOTE — Care Management Note (Signed)
Case Management Note  Patient Details  Name: Nicole Maxwell MRN: [id number] Date of Birth: [birth year]  Subjective/Objective:             Resp. distress       Action/Plan:Date: November 26, 2014 Chart reviewed for concurrent status and case management needs. Will continue to follow patient for changes and needs: Marcelle Smilinghonda Davis, RN, BSN, [place]   [phone]   Expected Discharge Date:   (UNKNOWN)               Expected Discharge Plan:  Home/Self Care  In-House Referral:  NA  Discharge planning Services  CM Consult  Post Acute Care Choice:  NA Choice offered to:  NA  DME Arranged:    DME Agency:     HH Arranged:    HH Agency:     Status of Service:  In process, will continue to follow  Medicare Important Message Given:    Date Medicare IM Given:    Medicare IM give by:    Date Additional Medicare IM Given:    Additional Medicare Important Message give by:     If discussed at Long Length of Stay Meetings, dates discussed:    Additional Comments:  Golda AcreDavis, Rhonda Lynn, RN 11/26/2014, 2:22 PM

## 2014-11-26 NOTE — Progress Notes (Signed)
ANTIBIOTIC CONSULT NOTE - INITIAL  Pharmacy Consult for Levaquin Indication: COPD exacerbation  Allergies  Allergen Reactions  . Brovana [Arformoterol Tartrate] Shortness Of Breath and Cough    General intolerance  . Budesonide Shortness Of Breath and Cough    General intolerance    Patient Measurements:   Weight ~50kg as of 10/02/14  Vital Signs: Temp: 99.6 F (37.6 C) (11/01 0949) Temp Source: Rectal (11/01 0949) BP: 152/46 mmHg (11/01 1107) Pulse Rate: 114 (11/01 1107) Intake/Output from previous day:   Intake/Output from this shift:    Labs:  Recent Labs  11/26/14 1005  WBC 15.5*  HGB 10.9*  PLT 244  CREATININE 0.55   CrCl cannot be calculated (Unknown ideal weight.). No results for input(s): VANCOTROUGH, VANCOPEAK, VANCORANDOM, GENTTROUGH, GENTPEAK, GENTRANDOM, TOBRATROUGH, TOBRAPEAK, TOBRARND, AMIKACINPEAK, AMIKACINTROU, AMIKACIN in the last 72 hours.   Microbiology: No results found for this or any previous visit (from the past 720 hour(s)).  Medical History: Past Medical History  Diagnosis Date  . Hypertension   . Chronic back pain     "goes from the lower part of my back on down my legs"  (04/17/2014)  . Osteopenia   . PVD (peripheral vascular disease) (HCC)   . Tobacco abuse     " I am addicted"   . Vitamin D deficiency disease     03/26/2010 - 9.9ng/mL. Started on replacemnt  . Alcohol abuse, in remission quit 2004  . Neuromuscular disorder (HCC)     lumbar disc  . Anxiety     Xanax prn  . Shortness of breath     with anxiety  and activty  . Asthma   . Hyperlipidemia   . On home oxygen therapy     "2L; suppose to be 24/7" (04/17/2014)  . COPD (chronic obstructive pulmonary disease) (HCC)     cxr 04/03/2010 - hyperinflation  . Pneumonia     "3 times in the last month or so" (04/17/2014)  . Type II diabetes mellitus (HCC)   . Arthritis     "my whole body"    Medications:  Scheduled:  . albuterol  5 mg Nebulization Once  . ipratropium  0.5  mg Nebulization Once   Infusions:  . levofloxacin (LEVAQUIN) IV     PRN:   Assessment: 69 yo female smoker with history of COPD on home oxygen who presents to ED with worsening SHoB x 3 days. CXR negative for infiltrate. Pharmacy consulted to dose Levaquin for COPD exacerbation.  Goal of Therapy:  Appropriate dosing per indication and renal function  Plan:   Levaquin 750mg  IV q24h x 5 days  No further dosing adjustments anticipated, Pharmacy will sign-off  Please re-consult if clinical situation warrants  Loralee PacasErin Jarell Mcewen, PharmD, BCPS Pager: [phone] 11/26/2014,11:23 AM

## 2014-11-26 NOTE — ED Notes (Signed)
Per EMS. Pt from home. Hx of COPD, on home o2, 2L [place]. Pt reports worsening SOB for the past 3 days. Has tried home inhalers with no relief. Also has productive cough with green/brown sputum. Has PCP appointment on Friday, but could not wait. EMS noted exp wheezing and rhonchi. EMS gave total of 10mg  albuterol, 0.5mg  atrovent and 125mg  solu-medrol prior to arrival. Nebulizer still in process upon arrival and during triage.

## 2014-11-26 NOTE — Accreditation Note (Addendum)
Removed Bi-pap patient c/o nausea states " It feels like I Nicole Maxwell vomit. " applied N/C at 4 liters. o2 sats remain at 100 %. Noted patient with cough, noted light yellow thick phlegm.

## 2014-11-27 DIAGNOSIS — D72829 Elevated white blood cell count, unspecified: Secondary | ICD-10-CM

## 2014-11-27 DIAGNOSIS — E1100 Type 2 diabetes mellitus with hyperosmolarity without nonketotic hyperglycemic-hyperosmolar coma (NKHHC): Secondary | ICD-10-CM

## 2014-11-27 DIAGNOSIS — Z720 Tobacco use: Secondary | ICD-10-CM

## 2014-11-27 DIAGNOSIS — D649 Anemia, unspecified: Secondary | ICD-10-CM

## 2014-11-27 DIAGNOSIS — I10 Essential (primary) hypertension: Secondary | ICD-10-CM

## 2014-11-27 LAB — CBC
HEMATOCRIT: 34.4 % — AB (ref 36.0–46.0)
HEMOGLOBIN: 10.2 g/dL — AB (ref 12.0–15.0)
MCH: 26.9 pg (ref 26.0–34.0)
MCHC: 29.7 g/dL — AB (ref 30.0–36.0)
MCV: 90.8 fL (ref 78.0–100.0)
Platelets: 229 10*3/uL (ref 150–400)
RBC: 3.79 MIL/uL — ABNORMAL LOW (ref 3.87–5.11)
RDW: 14.4 % (ref 11.5–15.5)
WBC: 13.1 10*3/uL — ABNORMAL HIGH (ref 4.0–10.5)

## 2014-11-27 LAB — COMPREHENSIVE METABOLIC PANEL
ALT: 18 U/L (ref 14–54)
AST: 17 U/L (ref 15–41)
Albumin: 3.1 g/dL — ABNORMAL LOW (ref 3.5–5.0)
Alkaline Phosphatase: 49 U/L (ref 38–126)
Anion gap: 6 (ref 5–15)
BILIRUBIN TOTAL: 0.6 mg/dL (ref 0.3–1.2)
BUN: 13 mg/dL (ref 6–20)
CALCIUM: 7.7 mg/dL — AB (ref 8.9–10.3)
CO2: 34 mmol/L — AB (ref 22–32)
CREATININE: 0.51 mg/dL (ref 0.44–1.00)
Chloride: 100 mmol/L — ABNORMAL LOW (ref 101–111)
GFR calc Af Amer: >60 mL/min (ref >60)
GFR calc non Af Amer: >60 mL/min (ref >60)
GLUCOSE: 169 mg/dL — AB (ref 65–99)
Potassium: 4.1 mmol/L (ref 3.5–5.1)
Sodium: 140 mmol/L (ref 135–145)
Total Protein: 6 g/dL — ABNORMAL LOW (ref 6.5–8.1)

## 2014-11-27 LAB — HEMOGLOBIN A1C
Hgb A1c MFr Bld: 6.3 % — ABNORMAL HIGH (ref 4.8–5.6)
MEAN PLASMA GLUCOSE: 134 mg/dL

## 2014-11-27 LAB — GLUCOSE, CAPILLARY
GLUCOSE-CAPILLARY: 112 mg/dL — AB (ref 65–99)
GLUCOSE-CAPILLARY: 141 mg/dL — AB (ref 65–99)
Glucose-Capillary: 129 mg/dL — ABNORMAL HIGH (ref 65–99)
Glucose-Capillary: 155 mg/dL — ABNORMAL HIGH (ref 65–99)
Glucose-Capillary: 155 mg/dL — ABNORMAL HIGH (ref 65–99)
Glucose-Capillary: 171 mg/dL — ABNORMAL HIGH (ref 65–99)

## 2014-11-27 LAB — BRAIN NATRIURETIC PEPTIDE: B NATRIURETIC PEPTIDE 5: 87.7 pg/mL (ref 0.0–100.0)

## 2014-11-27 MED ORDER — IPRATROPIUM-ALBUTEROL 0.5-2.5 (3) MG/3ML IN SOLN
RESPIRATORY_TRACT | Status: AC
  Filled 2014-11-27: qty 3

## 2014-11-27 MED ORDER — GUAIFENESIN ER 600 MG PO TB12
1200.0000 mg | ORAL_TABLET | Freq: Two times a day (BID) | ORAL | Status: DC
  Administered 2014-11-27 – 2014-12-01 (×9): 1200 mg via ORAL
  Filled 2014-11-27 (×9): qty 2

## 2014-11-27 MED ORDER — IPRATROPIUM-ALBUTEROL 0.5-2.5 (3) MG/3ML IN SOLN: 3.0000 mL | Freq: Four times a day (QID) | RESPIRATORY_TRACT | Status: DC

## 2014-11-27 MED ORDER — FUROSEMIDE 10 MG/ML IJ SOLN
20.0000 mg | Freq: Two times a day (BID) | INTRAMUSCULAR | Status: DC
  Administered 2014-11-27 (×2): 20 mg via INTRAVENOUS
  Filled 2014-11-27 (×3): qty 2

## 2014-11-27 MED ORDER — IPRATROPIUM-ALBUTEROL 0.5-2.5 (3) MG/3ML IN SOLN
3.0000 mL | RESPIRATORY_TRACT | Status: DC
  Administered 2014-11-27 – 2014-11-29 (×14): 3 mL via RESPIRATORY_TRACT
  Filled 2014-11-27 (×13): qty 3

## 2014-11-27 MED ORDER — IPRATROPIUM BROMIDE 0.02 % IN SOLN: 0.5000 mg | RESPIRATORY_TRACT | Status: DC

## 2014-11-27 MED ORDER — INSULIN ASPART 100 UNIT/ML ~~LOC~~ SOLN
0.0000 [IU] | Freq: Three times a day (TID) | SUBCUTANEOUS | Status: DC
  Administered 2014-11-27: 1 [IU] via SUBCUTANEOUS
  Administered 2014-11-27: 2 [IU] via SUBCUTANEOUS
  Administered 2014-11-28 – 2014-11-29 (×5): 1 [IU] via SUBCUTANEOUS
  Administered 2014-11-29: 2 [IU] via SUBCUTANEOUS
  Administered 2014-11-30: 1 [IU] via SUBCUTANEOUS

## 2014-11-27 MED ORDER — INSULIN ASPART 100 UNIT/ML ~~LOC~~ SOLN: 0.0000 [IU] | Freq: Every day | SUBCUTANEOUS | Status: DC

## 2014-11-27 NOTE — Progress Notes (Signed)
Patient is not wearing bipap at this time. Patient is resting comfortably on 4L [place] with O2 Sat of 100%. RT will continue to monitor.

## 2014-11-27 NOTE — Progress Notes (Signed)
[facility] PROGRESS NOTE  Nicole Maxwell ZOX:[id number] DOB: 02/13/1945 DOA: 11/26/2014 PCP: [name], William, MD  Assessment/Plan: 1. Acute hypercarbic respiratory failure- secondary to COPD exacerbation ,patient had ABG which showed elevated PCO2 on 11/26/2014, repeat ABG this morning shows improvement PCO2 down to 64.7. BiPAP has been weaned off. Will continue with oxygen, IV Solu Medrol, DuoNeb nebulizers. Mucinex 1 tablet by mouth twice a day. 2. Acute on chronic diastolic heart failure-patient's echocardiogram from December 2015 shows grade 1 diastolic dysfunction, chest x-ray shows mild low-grade CHF. We'll start the patient on Lasix 20 may grams IV every 12 hours. Check BNP. 3. Diabetes mellitus- blood glucose is well controlled, continue sliding scale insulin, Levemir 5 units subcutaneous daily. 4. Essential hypertension-blood pressure is well controlled, continue Avapro. 5. Moderate protein calorie malnutrition- will get nutrition consult to assess patient's caloric needs. 6. DVT prophylaxis- heparin  Code Status:  Full code Family Communication:  No family at bedside Disposition Plan: home when medically stable   Consultants:  None  Procedures:  None  Antibiotics:  Doxycycline  HPI/Subjective: 69 y.o. female with history of COPD, O2 dependent on chronic steroids was brought to the hospital with worsening shortness of breath for past 4 days. Patient found to be hypoxic and hypercarbic with leukocytosis. Started on IV steroids and antibiotics.   Patient has been requiring BiPAP, feels uncomfortable on BiPAP. After BiPAP was removed patient says that she is beginning little better. Still coughing up yellow-colored phlegm. Patient currently on nasal cannula requiring 4 L oxygen.  Objective: Filed Vitals:   11/27/14 1200  BP:   Pulse:   Temp: 98.7 F (37.1 C)  Resp:     Intake/Output Summary (Last 24 hours) at 11/27/14 1319 Last data filed at 11/27/14 1100  Gross  per 24 hour  Intake   1340 ml  Output    850 ml  Net    490 ml   Filed Weights   11/26/14 1214  Weight: 51 kg (112 lb 7 oz)    Exam:   General:  Appears in no acute distress. Feels better after BiPAP taken off.  Cardiovascular: S1-S2 regular  Respiratory: Bilateral rhonchi auscultated  Abdomen: Soft, nontender, no organomegaly  Musculoskeletal: No edema/cyanosis/clubbing of the lower extremities   Data Reviewed: Basic Metabolic Panel:  Recent Labs Lab 11/26/14 1005 11/27/14 0340  NA 142 140  K 3.6 4.1  CL 100* 100*  CO2 35* 34*  GLUCOSE 160* 169*  BUN 14 13  CREATININE 0.55 0.51  CALCIUM 8.6* 7.7*   Liver Function Tests:  Recent Labs Lab 11/27/14 0340  AST 17  ALT 18  ALKPHOS 49  BILITOT 0.6  PROT 6.0*  ALBUMIN 3.1*   No results for input(s): LIPASE, AMYLASE in the last 168 hours. No results for input(s): AMMONIA in the last 168 hours. CBC:  Recent Labs Lab 11/26/14 1005 11/27/14 0340  WBC 15.5* 13.1*  NEUTROABS 12.9*  --   HGB 10.9* 10.2*  HCT 35.9* 34.4*  MCV 88.9 90.8  PLT 244 229   Cardiac Enzymes: No results for input(s): CKTOTAL, CKMB, CKMBINDEX, TROPONINI in the last 168 hours. BNP (last 3 results) No results for input(s): BNP in the last 8760 hours.  ProBNP (last 3 results)  Recent Labs  01/01/14 1213 01/03/14 0850  PROBNP 364.2* 724.5*    CBG:  Recent Labs Lab 11/26/14 2021 11/26/14 2354 11/27/14 0344 11/27/14 0747 11/27/14 1203  GLUCAP 92 129* 171* 112* 155*    Recent Results (from the  past 240 hour(s))  MRSA PCR Screening     Status: None   Collection Time: 11/26/14 12:16 PM  Result Value Ref Range Status   MRSA by PCR NEGATIVE NEGATIVE Final    Comment:        The GeneXpert MRSA Assay (FDA approved for NASAL specimens only), is one component of a comprehensive MRSA colonization surveillance program. It is not intended to diagnose MRSA infection nor to guide or monitor treatment for MRSA infections.       Studies: Dg Chest Port 1 View  11/26/2014  CLINICAL DATA:  Worsening shortness of breath for the past 3 days associated with productive cough; history of asthma -COPD, current smoker, CHF, and diabetes EXAM: PORTABLE CHEST 1 VIEW COMPARISON:  PA and lateral chest x-ray of May 16, 2014 FINDINGS: The lungs remain hyperinflated. The interstitial markings are coarse but this is stable. There is no alveolar infiltrate. There is no pleural effusion or pneumothorax. The heart is normal in size. The pulmonary vascularity is mildly engorged centrally. The mediastinum is normal in width. The bony thorax exhibits no acute abnormality. IMPRESSION: COPD. Mild cephalization of the pulmonary vascularity may reflect low-grade CHF. There is no alveolar pneumonia. A follow-up PA and lateral chest x-ray would be useful. Electronically Signed   By: David  [place] M.D.   On: 11/26/2014 09:26    Scheduled Meds: . aspirin EC  81 mg Oral Daily  . atorvastatin  20 mg Oral q1800  . budesonide-formoterol  2 puff Inhalation BID  . doxycycline (VIBRAMYCIN) IV  100 mg Intravenous Q12H  . furosemide  40 mg Oral Daily  . guaiFENesin  1,200 mg Oral BID  . heparin  5,000 Units Subcutaneous 3 times per day  . insulin aspart  0-5 Units Subcutaneous QHS  . insulin aspart  0-9 Units Subcutaneous TID WC  . insulin detemir  5 Units Subcutaneous QHS  . ipratropium-albuterol  3 mL Nebulization Q4H  . irbesartan  150 mg Oral Daily  . methylPREDNISolone (SOLU-MEDROL) injection  80 mg Intravenous Q6H  . pantoprazole  40 mg Oral Q breakfast  . sodium chloride  3 mL Intravenous Q12H   Continuous Infusions:   Active Problems:   Malnutrition of moderate degree (HCC)   Chronic diastolic congestive heart failure (HCC)   DM type 2 (diabetes mellitus, type 2) (HCC)   Smoking   Acute exacerbation of chronic obstructive pulmonary disease (COPD) (HCC)   Acute respiratory failure with hypoxia (HCC)   Leukocytosis   Normocytic  anemia   Essential hypertension    Time spent: 25 min    [facility]  [facility] Pager [phone]. If 7PM-7AM, please contact night-coverage at [email or web address], password [facility] 11/27/2014, 1:19 PM  LOS: 1 day

## 2014-11-27 NOTE — H&P (Signed)
[facility] History and Physical     History and Physical:    Nicole Ibalta S Span   ZOX:[id number] DOB: 1945-10-10 DOA: 11/26/2014  Referring MD/provider:  PCP: [name], William, MD   Chief Complaint: SOB  History of Present Illness:   Nicole Maxwell is an 69 y.o. female with PMH COPD oxygen dependant on chronic steroids with ongoing smoking that is brough to the ED by EMS, complaining of worsening SOB for 4 days progressively getting worst. She relates she increase her steroids but she cont to be SOB with with productive cough she normally can walk to the bathroom, but now she is SOB just at rest she increase her oxygen to 4-6 L with no relieve.  IN the ED: She was founf to be hypoxic and hypercarbic with a leukocytosis, CXR did not show any infiltrate.   ROS:   ROS  Constitutional: No fever, no chills;  Appetite normal; No weight loss, no weight gain, no fatigue.   HEENT: No blurry vision, no diplopia, no pharyngitis, no dysphagia  CV: No chest pain, no palpitations, no PND, no orthopnea, no edema.   Resp: no pleuritic pain.  GI: No nausea, no vomiting, no diarrhea, no melena, no hematochezia, no constipation, no abdominal pain.   GU: No dysuria, no hematuria, no frequency, no urgency.  MSK: No myalgias, no arthralgias.   Neuro:  No headache, no focal neurological deficits, no history of seizures.   Psych: No depression, no anxiety.   Endo: No heat intolerance, no cold intolerance, no polyuria, no polydipsia   Skin: No rashes, no skin lesions.   Heme: No easy bruising.   Travel history: No recent travel.   Past Medical History:   Past Medical History  Diagnosis Date  . Hypertension   . Chronic back pain     "goes from the lower part of my back on down my legs"  (04/17/2014)  . Osteopenia   . PVD (peripheral vascular disease) (HCC)   . Tobacco abuse     " I am addicted"   . Vitamin D deficiency disease     03/26/2010 - 9.9ng/mL. Started on replacemnt  . Alcohol abuse,  in remission quit 2004  . Neuromuscular disorder (HCC)     lumbar disc  . Anxiety     Xanax prn  . Shortness of breath     with anxiety  and activty  . Asthma   . Hyperlipidemia   . On home oxygen therapy     "2L; suppose to be 24/7" (04/17/2014)  . COPD (chronic obstructive pulmonary disease) (HCC)     cxr 04/03/2010 - hyperinflation  . Pneumonia     "3 times in the last month or so" (04/17/2014)  . Type II diabetes mellitus (HCC)   . Arthritis     "my whole body"    Past Surgical History:   Past Surgical History  Procedure Laterality Date  . Lumbar laminectomy/decompression microdiscectomy  2005    L4-5/notes 03/06/2010  . Appendectomy  1960  . Tonsillectomy  1960  . Posterior lumbar fusion  2002  . Abdominal hysterectomy      "partial"  . Back surgery    . Carpal tunnel release Bilateral     CTS repair 2000 (R), 2006 (L)/notes 01/01/2014  . Cataract extraction w/ intraocular lens  implant, bilateral Bilateral 2015    Social History:   Social History   Social History  . Marital Status: Widowed    Spouse Name: N/A  .  Number of Children: 1  . Years of Education: N/A   Occupational History  . retired from [employer]    Social History Main Topics  . Smoking status: Current Every Day Smoker -- 0.25 packs/day for 56 years    Types: Cigarettes  . Smokeless tobacco: Never Used  . Alcohol Use: Yes     Comment: h/o heavy ETOH quit 2004  . Drug Use: No  . Sexual Activity: No   Other Topics Concern  . Not on file   Social History Narrative    Family history:   Family History  Problem Relation Age of Onset  . Heart attack Sister 46    CABG  . Breast cancer Sister   . Cancer Brother   . Heart attack Brother   . Hypertension Other     all siblings  . Anesthesia problems Neg Hx     Allergies   Brovana and Budesonide  Current Medications:   Prior to Admission medications   Medication Sig Start Date End Date Taking? Authorizing Provider   acetaminophen (TYLENOL) 500 MG tablet Take 500-1,000 mg by mouth every 8 (eight) hours as needed (for pain).   Yes Historical Provider, MD  albuterol (PROVENTIL HFA;VENTOLIN HFA) 108 (90 BASE) MCG/ACT inhaler Inhale 2 puffs into the lungs every 4 (four) hours as needed for wheezing or shortness of breath.    Yes Historical Provider, MD  albuterol (PROVENTIL) (2.5 MG/3ML) 0.083% nebulizer solution Take 3 mLs (2.5 mg total) by nebulization every 4 (four) hours as needed for wheezing or shortness of breath. (may take 1 every 4 hours as needed for wheezing/shortness of breath) DX: 496 01/09/14  Yes [name], MD  alprazolam Prudy Feeler) 2 MG tablet Take 0.5-1 tablets (1-2 mg total) by mouth 3 (three) times daily as needed for anxiety. 04/18/14  Yes Dorothea Ogle, MD  aspirin EC 81 MG EC tablet Take 1 tablet (81 mg total) by mouth daily. 01/09/14  Yes [name], MD  atorvastatin (LIPITOR) 20 MG tablet Take 1 tablet daily 09/14/14  Yes Historical Provider, MD  budesonide-formoterol (SYMBICORT) 160-4.5 MCG/ACT inhaler Inhale 2 puffs into the lungs 2 (two) times daily. 04/18/14  Yes Dorothea Ogle, MD  dextromethorphan-guaiFENesin [facility] DM) 30-600 MG per 12 hr tablet Take 1 tablet by mouth 2 (two) times daily. Patient taking differently: Take 2 tablets by mouth 2 (two) times daily.  01/09/14  Yes [name], MD  fluticasone [name]) 50 MCG/ACT nasal spray Place 1 spray into both nostrils daily as needed for allergies or rhinitis.    Yes Historical Provider, MD  furosemide (LASIX) 40 MG tablet Take 1 tablet (40 mg total) by mouth daily. 01/09/14  Yes [name], MD  HYDROcodone-acetaminophen (NORCO/VICODIN) 5-325 MG per tablet Take 1 tablet by mouth every 6 (six) hours as needed for moderate pain. 04/18/14  Yes Dorothea Ogle, MD  irbesartan (AVAPRO) 150 MG tablet Take 1 tablet (150 mg total) by mouth daily. 01/09/14  Yes [name], MD  metFORMIN (GLUCOPHAGE) 500 MG tablet Take 500 mg by mouth 2 (two)  times daily with a meal.     Yes Historical Provider, MD  Multiple Vitamin (MULTIVITAMIN) tablet Take 1 tablet by mouth daily.   Yes Historical Provider, MD  pantoprazole (PROTONIX) 40 MG tablet Take 1 tablet (40 mg total) by mouth daily at 6 (six) AM. 01/09/14  Yes [name], MD  predniSONE (DELTASONE) 10 MG tablet 20mg  daily for 1 week then 10mg  daily for 1 week  and then back to  daily 10/04/14  Yes Tammy S Parrett, NP  tiotropium (SPIRIVA) 18 MCG inhalation capsule Place 1 capsule (18 mcg total) into inhaler and inhale daily. 04/09/12  Yes Jarome Matin, MD  doxycycline (VIBRA-TABS) 100 MG tablet Take 1 tablet (100 mg total) by mouth 2 (two) times daily. Patient not taking: Reported on 11/26/2014 10/02/14   Tammy S Parrett, NP  predniSONE (DELTASONE) 5 MG tablet Take 1 tablet (5 mg total) by mouth daily. After finishing prednisone taper Patient not taking: Reported on 11/26/2014 10/04/14   Julio Sicks, NP    Physical Exam:   Filed Vitals:   11/27/14 0600 11/27/14 0700 11/27/14 0753 11/27/14 0800  BP: 115/56 134/60    Pulse: 80 84    Temp:    98.4 F (36.9 C)  TempSrc:    Oral  Resp: 22 14    Height:      Weight:      SpO2: 98% 99% 100%      Physical Exam: Blood pressure 134/60, pulse 84, temperature 98.4 F (36.9 C), temperature source Oral, resp. rate 14, height  (1.626 m), weight 51 kg (112 lb 7 oz), SpO2 100 %. Gen: No acute distress. Head: Normocephalic, atraumatic. Eyes: PERRL, EOMI, sclerae nonicteric. Mouth: Oropharynx Neck: Using accessory muscle to breath, no jugular venous distention. Chest: [facility] ehas poor air movement, with wheezing bilaterally CV: Tachycardic with regular rate + S1 and S2. Abdomen: Soft, nontender, nondistended with normal active bowel sounds. Extremities: Extremities Skin: Warm and dry. Neuro: Alert and oriented times 3; cranial nerves II through XII grossly intact. Psych: Mood and affect normal.   Data Review:    Labs: Basic  Metabolic Panel:  Recent Labs Lab 11/26/14 1005 11/27/14 0340  NA 142 140  K 3.6 4.1  CL 100* 100*  CO2 35* 34*  GLUCOSE 160* 169*  BUN 14 13  CREATININE 0.55 0.51  CALCIUM 8.6* 7.7*   Liver Function Tests:  Recent Labs Lab 11/27/14 0340  AST 17  ALT 18  ALKPHOS 49  BILITOT 0.6  PROT 6.0*  ALBUMIN 3.1*   No results for input(s): LIPASE, AMYLASE in the last 168 hours. No results for input(s): AMMONIA in the last 168 hours. CBC:  Recent Labs Lab 11/26/14 1005 11/27/14 0340  WBC 15.5* 13.1*  NEUTROABS 12.9*  --   HGB 10.9* 10.2*  HCT 35.9* 34.4*  MCV 88.9 90.8  PLT 244 229   Cardiac Enzymes: No results for input(s): CKTOTAL, CKMB, CKMBINDEX, TROPONINI in the last 168 hours.  BNP (last 3 results)  Recent Labs  01/01/14 1213 01/03/14 0850  PROBNP 364.2* 724.5*   CBG:  Recent Labs Lab 11/26/14 1622 11/26/14 2021 11/26/14 2354 11/27/14 0344  GLUCAP 237* 92 129* 171*    Radiographic Studies: Dg Chest Port 1 View  11/26/2014  CLINICAL DATA:  Worsening shortness of breath for the past 3 days associated with productive cough; history of asthma -COPD, current smoker, CHF, and diabetes EXAM: PORTABLE CHEST 1 VIEW COMPARISON:  PA and lateral chest x-ray of May 16, 2014 FINDINGS: The lungs remain hyperinflated. The interstitial markings are coarse but this is stable. There is no alveolar infiltrate. There is no pleural effusion or pneumothorax. The heart is normal in size. The pulmonary vascularity is mildly engorged centrally. The mediastinum is normal in width. The bony thorax exhibits no acute abnormality. IMPRESSION: COPD. Mild cephalization of the pulmonary vascularity may reflect low-grade CHF. There is no alveolar pneumonia. A  follow-up PA and lateral chest x-ray would be useful. Electronically Signed   By: David  [place] M.D.   On: 11/26/2014 09:26   *I have personally reviewed the images above*  EKG: Independently reviewed. Sinus tachycardia normal  axis non specific T wave changes.   Assessment/Plan:   Acute exacerbation of chronic obstructive pulmonary disease (COPD) (HCC)/  Acute respiratory failure with hypoxia (HCC) Likely due to COPD, start her on IV steroids antibiotics and inhalers. Get inhaler scheduele and cont spiriva. Blood culture already order by the ED, lactic acid has remained < 2.0 Place her NPO and  on Bipap recheck a ABG in 1 hr. Place her on stepdown.   DM type 2 (diabetes mellitus, type 2) (HCC)  - Hold metformin, start her on low dose long acting insulin plus SSI. 0 check A1c.  Chronic diastolic congestive heart failure (HCC) No appreciated JVD, seems to be euvolumic as she NPO. Will start genlt IV fluid malignance.  Smoking: - counseling.  Leukocytosis Unclear etiology likely due to steroids and also in the differential infectious etiology on empric antibiotics.  Normocytic anemia: No sign of overt bleeding, she does not remember when was her last colonoscopy. Will need follow up as an outpatient. Hbg seems to be at baseline.  Moderate protein caloric malnutrition: Start supplementation once off bipap. Likely due to to poor respiratory status. Will have to discuss about goal of care once over this episode, and her wishes for the future.  Essential hypertension: Hold Antihypertensive medication for the next 24-48hrs. Her Bp has been close to goal in the ED. Can resume once off bipap.  DVT prophylaxis  Code Status: Full. Family Communication: Daughter Disposition Plan: Home when stable.  Time spent: 65 min  Marinda Elk [facility] Pager [phone]  If 7PM-7AM, please contact night-coverage [email or web address] Password [facility] 11/27/2014, 8:48 AM

## 2014-11-27 NOTE — Progress Notes (Signed)
Patient is resting comfortably on nasal cannula. BIPAP is not indicated at this time. O2 Sat is 100%.

## 2014-11-27 NOTE — Progress Notes (Signed)
Date: November 27, 2014 Chart reviewed for concurrent status and case management needs. Will continue to follow patient for changes and needs: WBC 13.1/C02=34/hgb 10.2/fI02 at 30%. Marcelle Smilinghonda Daveion Robar, RN, BSN, [place]   [phone]

## 2014-11-28 LAB — BASIC METABOLIC PANEL
Anion gap: 6 (ref 5–15)
BUN: 29 mg/dL — AB (ref 6–20)
CALCIUM: 7.8 mg/dL — AB (ref 8.9–10.3)
CO2: 36 mmol/L — ABNORMAL HIGH (ref 22–32)
CREATININE: 0.71 mg/dL (ref 0.44–1.00)
Chloride: 98 mmol/L — ABNORMAL LOW (ref 101–111)
Glucose, Bld: 176 mg/dL — ABNORMAL HIGH (ref 65–99)
Potassium: 3.6 mmol/L (ref 3.5–5.1)
SODIUM: 140 mmol/L (ref 135–145)

## 2014-11-28 LAB — GLUCOSE, CAPILLARY
GLUCOSE-CAPILLARY: 130 mg/dL — AB (ref 65–99)
GLUCOSE-CAPILLARY: 139 mg/dL — AB (ref 65–99)
Glucose-Capillary: 142 mg/dL — ABNORMAL HIGH (ref 65–99)
Glucose-Capillary: 138 mg/dL — ABNORMAL HIGH (ref 65–99)

## 2014-11-28 LAB — MAGNESIUM: Magnesium: 1.3 mg/dL — ABNORMAL LOW (ref 1.7–2.4)

## 2014-11-28 MED ORDER — POTASSIUM CHLORIDE CRYS ER 20 MEQ PO TBCR
40.0000 meq | EXTENDED_RELEASE_TABLET | Freq: Once | ORAL | Status: AC
  Administered 2014-11-28: 40 meq via ORAL
  Filled 2014-11-28: qty 2

## 2014-11-28 MED ORDER — DOXYCYCLINE HYCLATE 100 MG PO TABS
100.0000 mg | ORAL_TABLET | Freq: Two times a day (BID) | ORAL | Status: DC
  Administered 2014-11-28 – 2014-11-30 (×6): 100 mg via ORAL
  Filled 2014-11-28 (×6): qty 1

## 2014-11-28 MED ORDER — METHYLPREDNISOLONE SODIUM SUCC 125 MG IJ SOLR
60.0000 mg | Freq: Four times a day (QID) | INTRAMUSCULAR | Status: DC
  Administered 2014-11-28 – 2014-11-29 (×4): 60 mg via INTRAVENOUS
  Filled 2014-11-28 (×5): qty 2

## 2014-11-28 MED ORDER — FUROSEMIDE 40 MG PO TABS
40.0000 mg | ORAL_TABLET | Freq: Every day | ORAL | Status: DC
  Administered 2014-11-28 – 2014-12-01 (×4): 40 mg via ORAL
  Filled 2014-11-28 (×4): qty 1

## 2014-11-28 NOTE — Progress Notes (Signed)
Report called to Darl PikesSusan, RN. Patient being transferred to 1433. Patient alert and oriented, VS stable. Belongings being sent with patient.

## 2014-11-28 NOTE — Progress Notes (Signed)
PHARMACIST - PHYSICIAN COMMUNICATION DR:   Sharl MaLama CONCERNING: Antibiotic IV to Oral Route Change Policy  RECOMMENDATION: This patient is receiving Doxycycline by the intravenous route.  Based on criteria approved by the Pharmacy and Therapeutics Committee, the antibiotic(s) is/are being converted to the equivalent oral dose form(s).   DESCRIPTION: These criteria include:  Patient being treated for a respiratory tract infection, urinary tract infection, cellulitis or clostridium difficile associated diarrhea if on metronidazole  The patient is not neutropenic and does not exhibit a GI malabsorption state  The patient is eating (either orally or via tube) and/or has been taking other orally administered medications for a least 24 hours  The patient is improving clinically and has a Tmax < 100.5  If you have questions about this conversion, please contact the Pharmacy Department  []   [phone]( [phone] )  [facility] []   [phone]( [phone] )  [facility] []   [phone]( [phone] )  [facility] []   [phone]( [phone] )  [facility] [x]   [phone]( [phone] )  [facility]   Junita PushMichelle Bowie Doiron, PharmD, BCPS Pager: [phone] 11/28/2014@10 :52 AM

## 2014-11-28 NOTE — Progress Notes (Signed)
Initial Nutrition Assessment  DOCUMENTATION CODES:   Non-severe (moderate) malnutrition in context of chronic illness  INTERVENTION:   Encouraged PO intake of daily snacks requested from unit RD to continue to monitor  NUTRITION DIAGNOSIS:   Malnutrition related to chronic illness as evidenced by moderate depletion of body fat, severe depletion of muscle mass.  GOAL:   Patient will meet greater than or equal to 90% of their needs  MONITOR:   PO intake, Labs, Weight trends, Skin, I & O's  REASON FOR ASSESSMENT:   Consult Assessment of nutrition requirement/status  ASSESSMENT:   69 y.o. female with PMH COPD oxygen dependant on chronic steroids with ongoing smoking that is brough to the ED by EMS, complaining of worsening SOB for 4 days progressively getting worst.   Pt in room, reports UBW of 200 lb in 2014. Weight has remained stable around 115 lb in the past year. She states she has a hard time eating d/t her difficult breathing. Her dinner became cold last night before she could eat it.  Pt eating 80-90% of CHO modified diet.  Pt's daughter came into room during visit and states that pt had a bad experience during a previous admission. Pt was ordered Glucerna Shakes but did not receive most of them but was charged on her bill. Encouraged pt to let RD order supplements for her and ensured her they would be provided, but pt refused. Encouraged other protein sources for patient to snack on like pudding, ice cream and milk with meals. Pt not interested at this time.   Nutrition-Focused physical exam completed. Findings are moderate fat depletion, severe muscle depletion, and no edema.   Labs reviewed: CBGs: 141-155 Elevated BUN  Diet Order:  Diet Carb Modified Fluid consistency:: Thin; Room service appropriate?: Yes  Skin:  Reviewed, no issues  Last BM:  PTA  Height:   Ht Readings from Last 1 Encounters:  11/26/14 5\' 4"  (1.626 m)    Weight:   Wt Readings from  Last 1 Encounters:  11/28/14 118 lb 9.7 oz (53.8 kg)    Ideal Body Weight:  54.5 kg  BMI:  Body mass index is 20.35 kg/(m^2).  Estimated Nutritional Needs:   Kcal:  1600-1800  Protein:  80-90g  Fluid:  1.8L/day  EDUCATION NEEDS:   No education needs identified at this time  Tilda FrancoLindsey Varnell, MS, RD, LDN Pager: [phone] After Hours Pager: [phone]

## 2014-11-28 NOTE — Progress Notes (Signed)
Daughter states brought pt own med and gave her 2mg  of xanax from her own supply, pt refused to have me take it to pharmacy. Daughter states she will take it home.

## 2014-11-28 NOTE — Progress Notes (Signed)
[facility] PROGRESS NOTE  COLLEENA KURTENBACH BMW:[id number] DOB: 01-01-1946 DOA: 11/26/2014 PCP: [name], MD  Assessment/Plan: 1. Acute hypercarbic respiratory failure- secondary to COPD exacerbation ,patient had ABG which showed elevated PCO2 on 11/26/2014, repeat ABG this morning shows improvement PCO2 down to 64.7. BiPAP has been weaned off. Will continue with oxygen, IV Solu Medrol, DuoNeb nebulizers. Mucinex 1 tablet by mouth twice a day. Will cut down the dose of Solu-Medrol to 60 mg IV every 6 hours. 2. Acute on chronic diastolic heart failure-patient's echocardiogram from December 2015 shows grade 1 diastolic dysfunction, chest x-ray shows mild low-grade CHF. Patient started on Lasix 20 mg IV every 12 hours. Urine output of 1975 over past 24 hours. We will change the Lasix to by mouth 40 mg daily. Check BMP in a.m. 3. Diabetes mellitus- blood glucose is well controlled, continue sliding scale insulin, Levemir 5 units subcutaneous daily. 4. Essential hypertension-blood pressure is well controlled, continue Avapro. 5. Moderate protein calorie malnutrition- will get nutrition consult to assess patient's caloric needs. 6. DVT prophylaxis- heparin  Code Status:  Full code Family Communication:  No family at bedside Disposition Plan: home when medically stable   Consultants:  None  Procedures:  None  Antibiotics:  Doxycycline  HPI/Subjective: 69 y.o. female with history of COPD, O2 dependent on chronic steroids was brought to the hospital with worsening shortness of breath for past 4 days. Patient found to be hypoxic and hypercarbic with leukocytosis. Started on IV steroids and antibiotics.  Patient has been requiring BiPAP, feels uncomfortable on BiPAP. After BiPAP was removed patient says that she is beginning little better. Still coughing up yellow-colored phlegm. Patient currently on nasal cannula requiring 4 L oxygen. This morning patient feels better. Diuresing well  with Lasix.  Objective: Filed Vitals:   11/28/14 1029  BP: 132/66  Pulse: 100  Temp: 98.1 F (36.7 C)  Resp: 26    Intake/Output Summary (Last 24 hours) at 11/28/14 1045 Last data filed at 11/28/14 0930  Gross per 24 hour  Intake    840 ml  Output   1925 ml  Net  -1085 ml   Filed Weights   11/26/14 1214 11/28/14 0400  Weight: 51 kg (112 lb 7 oz) 53.8 kg (118 lb 9.7 oz)    Exam:   General:  Appears in no acute distress. Feels better after BiPAP taken off.  Cardiovascular: S1-S2 regular  Respiratory: Bilateral rhonchi auscultated  Abdomen: Soft, nontender, no organomegaly  Musculoskeletal: No edema/cyanosis/clubbing of the lower extremities   Data Reviewed: Basic Metabolic Panel:  Recent Labs Lab 11/26/14 1005 11/27/14 0340 11/28/14 0350  NA 142 140 140  K 3.6 4.1 3.6  CL 100* 100* 98*  CO2 35* 34* 36*  GLUCOSE 160* 169* 176*  BUN 14 13 29*  CREATININE 0.55 0.51 0.71  CALCIUM 8.6* 7.7* 7.8*   Liver Function Tests:  Recent Labs Lab 11/27/14 0340  AST 17  ALT 18  ALKPHOS 49  BILITOT 0.6  PROT 6.0*  ALBUMIN 3.1*   No results for input(s): LIPASE, AMYLASE in the last 168 hours. No results for input(s): AMMONIA in the last 168 hours. CBC:  Recent Labs Lab 11/26/14 1005 11/27/14 0340  WBC 15.5* 13.1*  NEUTROABS 12.9*  --   HGB 10.9* 10.2*  HCT 35.9* 34.4*  MCV 88.9 90.8  PLT 244 229   Cardiac Enzymes: No results for input(s): CKTOTAL, CKMB, CKMBINDEX, TROPONINI in the last 168 hours. BNP (last 3 results)  Recent Labs  11/27/14 1458  BNP 87.7    ProBNP (last 3 results)  Recent Labs  01/01/14 1213 01/03/14 0850  PROBNP 364.2* 724.5*    CBG:  Recent Labs Lab 11/27/14 0747 11/27/14 1203 11/27/14 1719 11/27/14 2133 11/28/14 0741  GLUCAP 112* 155* 141* 155* 142*    Recent Results (from the past 240 hour(s))  Culture, blood (routine x 2)     Status: None (Preliminary result)   Collection Time: 11/26/14 10:01 AM   Result Value Ref Range Status   Specimen Description BLOOD RIGHT ANTECUBITAL  Final   Special Requests BOTTLES DRAWN AEROBIC ONLY 5ML  Final   Culture   Final    NO GROWTH 1 DAY [facility]    Report Status PENDING  Incomplete  MRSA PCR Screening     Status: None   Collection Time: 11/26/14 12:16 PM  Result Value Ref Range Status   MRSA by PCR NEGATIVE NEGATIVE Final    Comment:        The GeneXpert MRSA Assay (FDA approved for NASAL specimens only), is one component of a comprehensive MRSA colonization surveillance program. It is not intended to diagnose MRSA infection nor to guide or monitor treatment for MRSA infections.   Culture, blood (routine x 2)     Status: None (Preliminary result)   Collection Time: 11/26/14  1:20 PM  Result Value Ref Range Status   Specimen Description BLOOD RIGHT ARM  Final   Special Requests BOTTLES DRAWN AEROBIC AND ANAEROBIC 10CC EACH  Final   Culture   Final    NO GROWTH < 24 HOURS [facility] at [facility]    Report Status PENDING  Incomplete     Studies: No results found.  Scheduled Meds: . aspirin EC  81 mg Oral Daily  . atorvastatin  20 mg Oral q1800  . budesonide-formoterol  2 puff Inhalation BID  . doxycycline (VIBRAMYCIN) IV  100 mg Intravenous Q12H  . furosemide  20 mg Intravenous Q12H  . guaiFENesin  1,200 mg Oral BID  . heparin  5,000 Units Subcutaneous 3 times per day  . insulin aspart  0-5 Units Subcutaneous QHS  . insulin aspart  0-9 Units Subcutaneous TID WC  . insulin detemir  5 Units Subcutaneous QHS  . ipratropium-albuterol  3 mL Nebulization Q4H  . irbesartan  150 mg Oral Daily  . methylPREDNISolone (SOLU-MEDROL) injection  60 mg Intravenous Q6H  . pantoprazole  40 mg Oral Q breakfast  . sodium chloride  3 mL Intravenous Q12H   Continuous Infusions:   Active Problems:   Malnutrition of moderate degree (HCC)   Chronic diastolic congestive heart failure (HCC)   DM type 2  (diabetes mellitus, type 2) (HCC)   Smoking   Acute exacerbation of chronic obstructive pulmonary disease (COPD) (HCC)   Acute respiratory failure with hypoxia (HCC)   Leukocytosis   Normocytic anemia   Essential hypertension    Time spent: 25 min    [facility] Center[facility],Joshu Furukawa S  [facility] Pager [phone]. If 7PM-7AM, please contact night-coverage at [email or web address], password [email or web address] 11/28/2014, 10:45 AM  LOS: 2 days

## 2014-11-29 ENCOUNTER — Inpatient Hospital Stay (HOSPITAL_COMMUNITY): Payer: Medicare Other

## 2014-11-29 DIAGNOSIS — J441 Chronic obstructive pulmonary disease with (acute) exacerbation: Principal | ICD-10-CM

## 2014-11-29 DIAGNOSIS — J9601 Acute respiratory failure with hypoxia: Secondary | ICD-10-CM

## 2014-11-29 DIAGNOSIS — I5032 Chronic diastolic (congestive) heart failure: Secondary | ICD-10-CM

## 2014-11-29 LAB — CBC
HCT: 37.5 % (ref 36.0–46.0)
Hemoglobin: 11 g/dL — ABNORMAL LOW (ref 12.0–15.0)
MCH: 26.6 pg (ref 26.0–34.0)
MCHC: 29.3 g/dL — ABNORMAL LOW (ref 30.0–36.0)
MCV: 90.8 fL (ref 78.0–100.0)
PLATELETS: 252 10*3/uL (ref 150–400)
RBC: 4.13 MIL/uL (ref 3.87–5.11)
RDW: 14.3 % (ref 11.5–15.5)
WBC: 19.9 10*3/uL — AB (ref 4.0–10.5)

## 2014-11-29 LAB — BASIC METABOLIC PANEL
ANION GAP: 5 (ref 5–15)
BUN: 24 mg/dL — AB (ref 6–20)
CALCIUM: 8.1 mg/dL — AB (ref 8.9–10.3)
CO2: 38 mmol/L — ABNORMAL HIGH (ref 22–32)
CREATININE: 0.58 mg/dL (ref 0.44–1.00)
Chloride: 99 mmol/L — ABNORMAL LOW (ref 101–111)
GFR calc Af Amer: >60 mL/min (ref >60)
GLUCOSE: 121 mg/dL — AB (ref 65–99)
Potassium: 4.2 mmol/L (ref 3.5–5.1)
Sodium: 142 mmol/L (ref 135–145)

## 2014-11-29 LAB — GLUCOSE, CAPILLARY
GLUCOSE-CAPILLARY: 90 mg/dL (ref 65–99)
Glucose-Capillary: 130 mg/dL — ABNORMAL HIGH (ref 65–99)
Glucose-Capillary: 184 mg/dL — ABNORMAL HIGH (ref 65–99)

## 2014-11-29 MED ORDER — IPRATROPIUM-ALBUTEROL 0.5-2.5 (3) MG/3ML IN SOLN
3.0000 mL | RESPIRATORY_TRACT | Status: DC
  Administered 2014-11-29 – 2014-11-30 (×8): 3 mL via RESPIRATORY_TRACT
  Filled 2014-11-29 (×8): qty 3

## 2014-11-29 MED ORDER — TIOTROPIUM BROMIDE MONOHYDRATE 18 MCG IN CAPS
18.0000 ug | ORAL_CAPSULE | Freq: Every day | RESPIRATORY_TRACT | Status: DC
  Administered 2014-11-30 – 2014-12-01 (×2): 18 ug via RESPIRATORY_TRACT
  Filled 2014-11-29: qty 5

## 2014-11-29 MED ORDER — AEROCHAMBER Z-STAT PLUS/MEDIUM MISC
1.0000 | Freq: Once | Status: DC
  Filled 2014-11-29: qty 1

## 2014-11-29 MED ORDER — FUROSEMIDE 10 MG/ML IJ SOLN
40.0000 mg | Freq: Once | INTRAMUSCULAR | Status: AC
  Administered 2014-11-29: 40 mg via INTRAVENOUS
  Filled 2014-11-29: qty 4

## 2014-11-29 MED ORDER — METHYLPREDNISOLONE SODIUM SUCC 125 MG IJ SOLR
60.0000 mg | Freq: Every day | INTRAMUSCULAR | Status: DC
Start: 2014-11-30 — End: 2014-12-01
  Administered 2014-11-30: 60 mg via INTRAVENOUS
  Filled 2014-11-29: qty 2

## 2014-11-29 MED ORDER — ENOXAPARIN SODIUM 40 MG/0.4ML ~~LOC~~ SOLN
40.0000 mg | Freq: Every day | SUBCUTANEOUS | Status: DC
  Administered 2014-11-29 – 2014-12-11 (×13): 40 mg via SUBCUTANEOUS
  Filled 2014-11-29 (×14): qty 0.4

## 2014-11-29 NOTE — Care Management Important Message (Signed)
Important Message  Patient Details  Name: Nicole Maxwell MRN: [id number] Date of Birth: Nov 17, 1945   Medicare Important Message Given:  [facility]    Haskell FlirtJamison, Loura Pitt 11/29/2014, 11:09 AMImportant Message  Patient Details  Name: Nicole Maxwell MRN: [id number] Date of Birth: Nov 17, 1945   Medicare Important Message Given:  Yes-second notification given    Haskell FlirtJamison, Lillyth Spong 11/29/2014, 11:08 AM

## 2014-11-29 NOTE — Consult Note (Signed)
PULMONARY / CRITICAL CARE MEDICINE   Name: Nicole Maxwell MRN: [id number] DOB: 30-Nov-1945    ADMISSION DATE:  11/26/2014 CONSULTATION DATE:  11/29/2014  REFERRING MD :  [name], [facility]  CHIEF COMPLAINT:  Shortness of breath  INITIAL PRESENTATION: 69 -year-old female with a history of noncompliance, COPD and recurrent exacerbations was admitted to the [facility] on 11/26/2014 with acute hypoxemic respiratory failure felt to be due to a COPD exacerbation.  STUDIES:    SIGNIFICANT EVENTS:    HISTORY OF PRESENT ILLNESS:  Nicole Maxwell was admitted to [facility] on 11/26/2014 with shortness of breath, cough, and wheezing. She said that she's not really sure what brought on her symptoms but she thinks it may have been dust in her house. She doesn't recall having cold or flulike symptoms before her presentation. However, she notes progressive dyspnea which led her to coming to the emergency department. She notes that her medications have been expensive and she has been taking the Spiriva but it sounds like she has been out of the Symbicort. She had been smoking at home as well. She was admitted initially on 11/26/2014 and treated for a COPD exacerbation. She had some improvement around the time of admission when she was given Lasix and had significant diuresis. However, pulmonary critical care medicine was consulted on 11/29/2014 because of ongoing shortness of breath and failure to progress.  PAST MEDICAL HISTORY :   has a past medical history of Hypertension; Chronic back pain; Osteopenia; PVD (peripheral vascular disease) (HCC); Tobacco abuse; Vitamin D deficiency disease; Alcohol abuse, in remission (quit 2004); Neuromuscular disorder (HCC); Anxiety; Shortness of breath; Asthma; Hyperlipidemia; On home oxygen therapy; COPD (chronic obstructive pulmonary disease) (HCC); Pneumonia; Type II diabetes mellitus (HCC); and Arthritis.  has past surgical history that includes Lumbar  laminectomy/decompression microdiscectomy (2005); Appendectomy (1960); Tonsillectomy (1960); Posterior lumbar fusion (2002); Abdominal hysterectomy; Back surgery; Carpal tunnel release (Bilateral); and Cataract extraction w/ intraocular lens  implant, bilateral (Bilateral, 2015). Prior to Admission medications   Medication Sig Start Date End Date Taking? Authorizing Provider  acetaminophen (TYLENOL) 500 MG tablet Take 500-1,000 mg by mouth every 8 (eight) hours as needed (for pain).   Yes Historical Provider, MD  albuterol (PROVENTIL HFA;VENTOLIN HFA) 108 (90 BASE) MCG/ACT inhaler Inhale 2 puffs into the lungs every 4 (four) hours as needed for wheezing or shortness of breath.    Yes Historical Provider, MD  albuterol (PROVENTIL) (2.5 MG/3ML) 0.083% nebulizer solution Take 3 mLs (2.5 mg total) by nebulization every 4 (four) hours as needed for wheezing or shortness of breath. (may take 1 every 4 hours as needed for wheezing/shortness of breath) DX: 496 01/09/14  Yes [name], MD  alprazolam Prudy Feeler) 2 MG tablet Take 0.5-1 tablets (1-2 mg total) by mouth 3 (three) times daily as needed for anxiety. 04/18/14  Yes Dorothea Ogle, MD  aspirin EC 81 MG EC tablet Take 1 tablet (81 mg total) by mouth daily. 01/09/14  Yes [name], MD  atorvastatin (LIPITOR) 20 MG tablet Take 1 tablet daily 09/14/14  Yes Historical Provider, MD  budesonide-formoterol (SYMBICORT) 160-4.5 MCG/ACT inhaler Inhale 2 puffs into the lungs 2 (two) times daily. 04/18/14  Yes Dorothea Ogle, MD  dextromethorphan-guaiFENesin [facility] DM) 30-600 MG per 12 hr tablet Take 1 tablet by mouth 2 (two) times daily. Patient taking differently: Take 2 tablets by mouth 2 (two) times daily.  01/09/14  Yes [name], MD  fluticasone (FLONASE) 50 MCG/ACT nasal spray  Place 1 spray into both nostrils daily as needed for allergies or rhinitis.    Yes Historical Provider, MD  furosemide (LASIX) 40 MG tablet Take 1 tablet (40 mg total) by mouth daily.  01/09/14  Yes Martha ClanWilliam Shaw, MD  HYDROcodone-acetaminophen (NORCO/VICODIN) 5-325 MG per tablet Take 1 tablet by mouth every 6 (six) hours as needed for moderate pain. 04/18/14  Yes Dorothea OgleIskra M Myers, MD  irbesartan (AVAPRO) 150 MG tablet Take 1 tablet (150 mg total) by mouth daily. 01/09/14  Yes Martha ClanWilliam Shaw, MD  metFORMIN (GLUCOPHAGE) 500 MG tablet Take 500 mg by mouth 2 (two) times daily with a meal.     Yes Historical Provider, MD  Multiple Vitamin (MULTIVITAMIN) tablet Take 1 tablet by mouth daily.   Yes Historical Provider, MD  pantoprazole (PROTONIX) 40 MG tablet Take 1 tablet (40 mg total) by mouth daily at 6 (six) AM. 01/09/14  Yes Martha ClanWilliam Shaw, MD  predniSONE (DELTASONE) 10 MG tablet 20mg  daily for 1 week then 10mg  daily for 1 week and then back to 5mg  daily 10/04/14  Yes Tammy S Parrett, NP  tiotropium (SPIRIVA) 18 MCG inhalation capsule Place 1 capsule (18 mcg total) into inhaler and inhale daily. 04/09/12  Yes Jarome Matinaniel Paterson, MD  doxycycline (VIBRA-TABS) 100 MG tablet Take 1 tablet (100 mg total) by mouth 2 (two) times daily. Patient not taking: Reported on 11/26/2014 10/02/14   Tammy S Parrett, NP  predniSONE (DELTASONE) 5 MG tablet Take 1 tablet (5 mg total) by mouth daily. After finishing prednisone taper Patient not taking: Reported on 11/26/2014 10/04/14   Julio Sicksammy S Parrett, NP   Allergies  Allergen Reactions  . Brovana [Arformoterol Tartrate] Shortness Of Breath and Cough    General intolerance  . Budesonide Shortness Of Breath and Cough    General intolerance    FAMILY HISTORY:  has no family status information on file.  SOCIAL HISTORY:  reports that she has been smoking Cigarettes.  She has a 14 pack-year smoking history. She has never used smokeless tobacco. She reports that she drinks alcohol. She reports that she does not use illicit drugs.  REVIEW OF SYSTEMS:   Gen: Denies fever, chills, weight change, fatigue, night sweats HEENT: Denies blurred vision, double vision, hearing  loss, tinnitus, sinus congestion, rhinorrhea, sore throat, neck stiffness, dysphagia PULM: per HPI CV: Denies chest pain, edema, orthopnea, paroxysmal nocturnal dyspnea, palpitations GI: Denies abdominal pain, nausea, vomiting, diarrhea, hematochezia, melena, constipation, change in bowel habits GU: Denies dysuria, hematuria, polyuria, oliguria, urethral discharge Endocrine: Denies hot or cold intolerance, polyuria, polyphagia or appetite change Derm: Denies rash, dry skin, scaling or peeling skin change Heme: Denies easy bruising, bleeding, bleeding gums Neuro: Denies headache, numbness, weakness, slurred speech, loss of memory or consciousness   SUBJECTIVE:   VITAL SIGNS: Temp:  [97.7 F (36.5 C)-98.4 F (36.9 C)] 98.4 F (36.9 C) (11/04 1421) Pulse Rate:  [92-103] 100 (11/04 1421) Resp:  [24-28] 28 (11/04 1421) BP: (128-151)/(53-77) 151/71 mmHg (11/04 1421) SpO2:  [90 %-100 %] 90 % (11/04 1421) HEMODYNAMICS:   VENTILATOR SETTINGS:   INTAKE / OUTPUT:  Intake/Output Summary (Last 24 hours) at 11/29/14 1442 Last data filed at 11/29/14 1215  Gross per 24 hour  Intake    840 ml  Output   1675 ml  Net   -835 ml    PHYSICAL EXAMINATION: General:  Frail, lying in bed, no distress Neuro:  Awake alert, oriented 4, moves all 4 extremities HEENT:  Normocephalic atraumatic, oropharynx clear Cardiovascular:  Regular rate and rhythm slight systolic murmur, questionable JVD Lungs:  Crackles in bases of lungs bilaterally, diffuse wheezing bilaterally, weak cough Abdomen:  Bowel sounds positive, nontender nondistended Musculoskeletal:  Normal bulk and tone Skin:  No rash or skin breakdown  LABS:  CBC  Recent Labs Lab 11/26/14 1005 11/27/14 0340 11/29/14 0455  WBC 15.5* 13.1* 19.9*  HGB 10.9* 10.2* 11.0*  HCT 35.9* 34.4* 37.5  PLT 244 229 252   Coag's  Recent Labs Lab 11/26/14 1542  APTT 26  INR 0.93   BMET  Recent Labs Lab 11/27/14 0340 11/28/14 0350  11/29/14 0455  NA 140 140 142  K 4.1 3.6 4.2  CL 100* 98* 99*  CO2 34* 36* 38*  BUN 13 29* 24*  CREATININE 0.51 0.71 0.58  GLUCOSE 169* 176* 121*   Electrolytes  Recent Labs Lab 11/27/14 0340 11/28/14 0350 11/29/14 0455  CALCIUM 7.7* 7.8* 8.1*  MG  --  1.3*  --    Sepsis Markers  Recent Labs Lab 11/26/14 1014 11/26/14 1542 11/26/14 1830  LATICACIDVEN 0.66 1.3 1.7  PROCALCITON  --  <0.10  --    ABG  Recent Labs Lab 11/26/14 1108 11/26/14 1550  PHART 7.238* 7.326*  PCO2ART 81.2* 64.7*  PO2ART 84.5 77.8*   Liver Enzymes  Recent Labs Lab 11/27/14 0340  AST 17  ALT 18  ALKPHOS 49  BILITOT 0.6  ALBUMIN 3.1*   Cardiac Enzymes No results for input(s): TROPONINI, PROBNP in the last 168 hours. Glucose  Recent Labs Lab 11/28/14 0741 11/28/14 1144 11/28/14 1704 11/28/14 2153 11/29/14 0739 11/29/14 1148  GLUCAP 142* 138* 130* 139* 130* 184*    Imaging 11/26/2014 chest x-ray images personally reviewed showing emphysema but no infiltrate   ASSESSMENT / PLAN:  PULMONARY  A: Acute on chronic respiratory failure with hypoxemia> due to exacerbation of her severe COPD and acute pulmonary edema.  Her COPD exacerbation is due to the fact that she's been noncompliant with inhaled therapies and continues to smoke at home. It does not sound as if she had an acute viral illness to start this.  Mucus burden and chest congestion are making her symptoms worse Tobacco abuse P:   Repeat CXR now Will decrease the dose of Solu-Medrol to 60 daily for today as this should be sufficient Add Spiriva Drop ipratropium nebulized for now (is this causing secretions to thicken?) Continue guaifenesin I educated her on importance of using flutter valve and incentive spirometry Out of bed as often as possible Continue albuterol every 4 hours Continue Symbicort, add spacer Will give Lasix 1 dose  continue O2 as needed to maintain O2 saturation greater than  90%  CARDIOVASCULAR  A:Acute diastolic heart failure exacerbation: Even though her proBNP was normal she appears volume overloaded on my exam today. P:   Diuresis now with Lasix 1 dose Continue telemetry monitoring   Heber [place], MD [place] [facility] Pager: [phone] Cell: [phone] After 3pm or if no response, call [phone]    11/29/2014, 2:42 PM

## 2014-11-29 NOTE — Progress Notes (Signed)
[facility] PROGRESS NOTE  Nicole Maxwell FAO:[id number] DOB: 11-18-1945 DOA: 11/26/2014 PCP: [name], William, MD  Assessment/Plan: 1. Acute hypercarbic respiratory failure- secondary to COPD exacerbation ,patient had ABG which showed elevated PCO2 on 11/26/2014, repeat ABG this morning shows improvement PCO2 down to 64.7. BiPAP has been weaned off. Will continue with oxygen, IV Solu Medrol 60 mg every 6 hours, DuoNeb nebulizers. Mucinex 1 tablet by mouth twice a day.  2. Acute on chronic diastolic heart failure-patient's echocardiogram from December 2015 shows grade 1 diastolic dysfunction, chest x-ray shows mild low-grade CHF. Patient started on Lasix 20 mg IV every 12 hours. Urine output of 1975 over past 24 hours. Lasix 40 mg by mouth daily 3. Diabetes mellitus- blood glucose is well controlled, continue sliding scale insulin, Levemir 5 units subcutaneous daily.  4. Leukocytosis- white count elevated to 19,000, likely from steroids. 5. Essential hypertension-blood pressure is well controlled, continue Avapro. 6. Protein calorie malnutrition- nutrition consult to assess patient's caloric needs. 7. DVT prophylaxis- heparin  Code Status:  Full code Family Communication:  No family at bedside Disposition Plan: home when medically stable   Consultants:  None  Procedures:  None  Antibiotics:  Doxycycline  HPI/Subjective: 69 y.o. female with history of COPD, O2 dependent on chronic steroids was brought to the hospital with worsening shortness of breath for past 4 days. Patient found to be hypoxic and hypercarbic with leukocytosis. Started on IV steroids and antibiotics.  Patient has been requiring BiPAP, feels uncomfortable on BiPAP. After BiPAP was removed patient says that she is beginning little better. Still coughing up yellow-colored phlegm. Patient currently on nasal cannula requiring 4 L oxygen. This morning patient feels better. Still coughing up phlegm. Gets short of breath  on exertion.   is a Objective: Filed Vitals:   11/29/14 0552  BP: 128/53  Pulse: 92  Temp: 97.7 F (36.5 C)  Resp: 25    Intake/Output Summary (Last 24 hours) at 11/29/14 0959 Last data filed at 11/29/14 0759  Gross per 24 hour  Intake    960 ml  Output   1825 ml  Net   -865 ml   Filed Weights   11/26/14 1214 11/28/14 0400  Weight: 51 kg (112 lb 7 oz) 53.8 kg (118 lb 9.7 oz)    Exam:   General:  Appears in no acute distress. Feels better after BiPAP taken off.  Cardiovascular: S1-S2 regular  Respiratory: Bilateral rhonchi auscultated  Abdomen: Soft, nontender, no organomegaly  Musculoskeletal: No edema/cyanosis/clubbing of the lower extremities   Data Reviewed: Basic Metabolic Panel:  Recent Labs Lab 11/26/14 1005 11/27/14 0340 11/28/14 0350 11/29/14 0455  NA 142 140 140 142  K 3.6 4.1 3.6 4.2  CL 100* 100* 98* 99*  CO2 35* 34* 36* 38*  GLUCOSE 160* 169* 176* 121*  BUN 14 13 29* 24*  CREATININE 0.55 0.51 0.71 0.58  CALCIUM 8.6* 7.7* 7.8* 8.1*  MG  --   --  1.3*  --    Liver Function Tests:  Recent Labs Lab 11/27/14 0340  AST 17  ALT 18  ALKPHOS 49  BILITOT 0.6  PROT 6.0*  ALBUMIN 3.1*   CBC:  Recent Labs Lab 11/26/14 1005 11/27/14 0340 11/29/14 0455  WBC 15.5* 13.1* 19.9*  NEUTROABS 12.9*  --   --   HGB 10.9* 10.2* 11.0*  HCT 35.9* 34.4* 37.5  MCV 88.9 90.8 90.8  PLT 244 229 252   Cardiac Enzymes: No results for input(s): CKTOTAL, CKMB, CKMBINDEX, TROPONINI  in the last 168 hours. BNP (last 3 results)  Recent Labs  11/27/14 1458  BNP 87.7    ProBNP (last 3 results)  Recent Labs  01/01/14 1213 01/03/14 0850  PROBNP 364.2* 724.5*    CBG:  Recent Labs Lab 11/28/14 0741 11/28/14 1144 11/28/14 1704 11/28/14 2153 11/29/14 0739  GLUCAP 142* 138* 130* 139* 130*    Recent Results (from the past 240 hour(s))  Culture, blood (routine x 2)     Status: None (Preliminary result)   Collection Time: 11/26/14 10:01 AM   Result Value Ref Range Status   Specimen Description BLOOD RIGHT ANTECUBITAL  Final   Special Requests BOTTLES DRAWN AEROBIC ONLY  Final   Culture   Final    NO GROWTH 2 DAYS Performed at [facility]    Report Status PENDING  Incomplete  MRSA PCR Screening     Status: None   Collection Time: 11/26/14 12:16 PM  Result Value Ref Range Status   MRSA by PCR NEGATIVE NEGATIVE Final    Comment:        The GeneXpert MRSA Assay (FDA approved for NASAL specimens only), is one component of a comprehensive MRSA colonization surveillance program. It is not intended to diagnose MRSA infection nor to guide or monitor treatment for MRSA infections.   Culture, blood (routine x 2)     Status: None (Preliminary result)   Collection Time: 11/26/14  1:20 PM  Result Value Ref Range Status   Specimen Description BLOOD RIGHT ARM  Final   Special Requests BOTTLES DRAWN AEROBIC AND ANAEROBIC 10CC EACH  Final   Culture   Final    NO GROWTH 2 DAYS Performed at [facility]    Report Status PENDING  Incomplete     Studies: No results found.  Scheduled Meds: . aspirin EC  81 mg Oral Daily  . atorvastatin  20 mg Oral q1800  . budesonide-formoterol  2 puff Inhalation BID  . doxycycline  100 mg Oral Q12H  . enoxaparin (LOVENOX) injection  40 mg Subcutaneous Q2000  . furosemide  40 mg Oral Daily  . guaiFENesin  1,200 mg Oral BID  . insulin aspart  0-5 Units Subcutaneous QHS  . insulin aspart  0-9 Units Subcutaneous TID WC  . insulin detemir  5 Units Subcutaneous QHS  . ipratropium-albuterol  3 mL Nebulization Q4H  . irbesartan  150 mg Oral Daily  . methylPREDNISolone (SOLU-MEDROL) injection  60 mg Intravenous Q6H  . pantoprazole  40 mg Oral Q breakfast  . sodium chloride  3 mL Intravenous Q12H   Continuous Infusions:   Active Problems:   Malnutrition of moderate degree (HCC)   Chronic diastolic congestive heart failure (HCC)   DM type 2 (diabetes mellitus, type 2)  (HCC)   Smoking   Acute exacerbation of chronic obstructive pulmonary disease (COPD) (HCC)   Acute respiratory failure with hypoxia (HCC)   Leukocytosis   Normocytic anemia   Essential hypertension    Time spent: 25 min    [employer] S  [facility] Pager [phone]. If 7PM-7AM, please contact night-coverage at [email or web address], password [facility] 11/29/2014, 9:59 AM  LOS: 3 days

## 2014-11-29 NOTE — Progress Notes (Signed)
Covering RN notified of increased respirations and slightly elevated blood pressure

## 2014-11-30 LAB — GLUCOSE, CAPILLARY
Glucose-Capillary: 131 mg/dL — ABNORMAL HIGH (ref 65–99)
Glucose-Capillary: 85 mg/dL (ref 65–99)
Glucose-Capillary: 96 mg/dL (ref 65–99)
Glucose-Capillary: 129 mg/dL — ABNORMAL HIGH (ref 65–99)
Glucose-Capillary: 156 mg/dL — ABNORMAL HIGH (ref 65–99)

## 2014-11-30 LAB — BLOOD GAS, ARTERIAL
ACID-BASE EXCESS: 13 mmol/L — AB (ref 0.0–2.0)
BICARBONATE: 44.6 meq/L — AB (ref 20.0–24.0)
Drawn by: 441261
O2 CONTENT: 2 L/min
O2 SAT: 93.9 %
PATIENT TEMPERATURE: 98.6
PO2 ART: 81.1 mmHg (ref 80.0–100.0)
TCO2: 42.4 mmol/L (ref 0–100)
pCO2 arterial: 113 mmHg (ref 35.0–45.0)
pH, Arterial: 7.219 — ABNORMAL LOW (ref 7.350–7.450)

## 2014-11-30 LAB — BASIC METABOLIC PANEL
ANION GAP: 4 — AB (ref 5–15)
BUN: 36 mg/dL — ABNORMAL HIGH (ref 6–20)
CHLORIDE: 98 mmol/L — AB (ref 101–111)
CO2: 45 mmol/L — AB (ref 22–32)
Calcium: 8.5 mg/dL — ABNORMAL LOW (ref 8.9–10.3)
Creatinine, Ser: 0.68 mg/dL (ref 0.44–1.00)
GFR calc Af Amer: >60 mL/min (ref >60)
GLUCOSE: 109 mg/dL — AB (ref 65–99)
POTASSIUM: 4 mmol/L (ref 3.5–5.1)
Sodium: 147 mmol/L — ABNORMAL HIGH (ref 135–145)

## 2014-11-30 MED ORDER — ALBUTEROL SULFATE (2.5 MG/3ML) 0.083% IN NEBU
2.5000 mg | INHALATION_SOLUTION | RESPIRATORY_TRACT | Status: DC
  Administered 2014-11-30 – 2014-12-01 (×3): 2.5 mg via RESPIRATORY_TRACT
  Filled 2014-11-30 (×3): qty 3

## 2014-11-30 MED ORDER — BISACODYL 10 MG RE SUPP
10.0000 mg | Freq: Once | RECTAL | Status: AC
  Administered 2014-11-30: 10 mg via RECTAL
  Filled 2014-11-30: qty 1

## 2014-11-30 MED ORDER — ALUM & MAG HYDROXIDE-SIMETH 200-200-20 MG/5ML PO SUSP: 30.0000 mL | Freq: Four times a day (QID) | ORAL | Status: DC | PRN

## 2014-11-30 NOTE — Progress Notes (Signed)
Report called to stepdown to s. Odom Rn.

## 2014-11-30 NOTE — Progress Notes (Signed)
Rt placed pt on NIV/BIPAP per MD order in ICU.

## 2014-11-30 NOTE — Progress Notes (Signed)
Report received from M.C. Sullivan, RN. No change from initial pm assessment. Will continue to monitor and follow the POC. 

## 2014-11-30 NOTE — Progress Notes (Signed)
RT called to bedside to draw ABG on patient. Patient was noticeably lethargic and a [place] at 2 L/min. ABG was drawn and ran. RN and MD notified of results. Patient is being transferred to 1223 and placed on BiPAP.

## 2014-11-30 NOTE — Progress Notes (Signed)
[facility] PROGRESS NOTE  Nicole Maxwell VWU:[id number] DOB: 15-Feb-1945 DOA: 11/26/2014 PCP: [name], MD  Assessment/Plan: 1. Acute hypercarbic respiratory failure- secondary to COPD exacerbation ,patient had ABG which showed elevated PCO2 on 11/01/2016f. Will continue with oxygen, IV Solu Medrol 60 mg every 6 hours, DuoNeb nebulizers. Mucinex 1 tablet by mouth twice a day. Will repeat ABG as patient is more lethargic. 2. Acute on chronic diastolic heart failure-patient's echocardiogram from December 2015 shows grade 1 diastolic dysfunction, chest x-ray shows mild low-grade CHF. Patient started on Lasix 20 mg IV every 12 hours. Urine output of 1975 over past 24 hours. Lasix 40 mg by mouth daily, extra dose of Lasix given by pulmonary. Follow BMP in a.m. 3. Diabetes mellitus- blood glucose is well controlled, continue sliding scale insulin, Levemir 5 units subcutaneous daily.  4. Leukocytosis- white count elevated to 19,000, likely from steroids. 5. Essential hypertension-blood pressure is well controlled, continue Avapro. 6. Protein calorie malnutrition- nutrition consult to assess patient's caloric needs. 7. DVT prophylaxis- heparin  Code Status:  Full code Family Communication:  No family at bedside Disposition Plan: home when medically stable   Consultants:  None  Procedures:  None  Antibiotics:  Doxycycline  HPI/Subjective: 69 y.o. female with history of COPD, O2 dependent on chronic steroids was brought to the hospital with worsening shortness of breath for past 4 days. Patient found to be hypoxic and hypercarbic with leukocytosis. Started on IV steroids and antibiotics.  Patient has been requiring BiPAP, feels uncomfortable on BiPAP. After BiPAP was removed patient says that she is beginning little better. Still coughing up yellow-colored phlegm. Patient currently on nasal cannula requiring 4 L oxygen. This morning patient has been very lethargic.  is  a Objective: Filed Vitals:   11/30/14 0457  BP: 145/61  Pulse: 101  Temp: 98.1 F (36.7 C)  Resp: 28    Intake/Output Summary (Last 24 hours) at 11/30/14 1407 Last data filed at 11/30/14 1238  Gross per 24 hour  Intake    360 ml  Output   1900 ml  Net  -1540 ml   Filed Weights   11/26/14 1214 11/28/14 0400  Weight: 51 kg (112 lb 7 oz) 53.8 kg (118 lb 9.7 oz)    Exam:   General:  Appears in no acute distress. Feels better after BiPAP taken off.  Cardiovascular: S1-S2 regular  Respiratory: Decreased breath sounds bilaterally  Abdomen: Soft, nontender, no organomegaly  Musculoskeletal: No edema/cyanosis/clubbing of the lower extremities   Data Reviewed: Basic Metabolic Panel:  Recent Labs Lab 11/26/14 1005 11/27/14 0340 11/28/14 0350 11/29/14 0455 11/30/14 1100  NA 142 140 140 142 147*  K 3.6 4.1 3.6 4.2 4.0  CL 100* 100* 98* 99* 98*  CO2 35* 34* 36* 38* 45*  GLUCOSE 160* 169* 176* 121* 109*  BUN 14 13 29* 24* 36*  CREATININE 0.55 0.51 0.71 0.58 0.68  CALCIUM 8.6* 7.7* 7.8* 8.1* 8.5*  MG  --   --  1.3*  --   --    Liver Function Tests:  Recent Labs Lab 11/27/14 0340  AST 17  ALT 18  ALKPHOS 49  BILITOT 0.6  PROT 6.0*  ALBUMIN 3.1*   CBC:  Recent Labs Lab 11/26/14 1005 11/27/14 0340 11/29/14 0455  WBC 15.5* 13.1* 19.9*  NEUTROABS 12.9*  --   --   HGB 10.9* 10.2* 11.0*  HCT 35.9* 34.4* 37.5  MCV 88.9 90.8 90.8  PLT 244 229 252   Cardiac Enzymes: No  results for input(s): CKTOTAL, CKMB, CKMBINDEX, TROPONINI in the last 168 hours. BNP (last 3 results)  Recent Labs  11/27/14 1458  BNP 87.7    ProBNP (last 3 results)  Recent Labs  01/01/14 1213 01/03/14 0850  PROBNP 364.2* 724.5*    CBG:  Recent Labs Lab 11/29/14 0739 11/29/14 1148 11/29/14 2134 11/30/14 0739 11/30/14 1127  GLUCAP 130* 184* 90 85 96    Recent Results (from the past 240 hour(s))  Culture, blood (routine x 2)     Status: None (Preliminary result)    Collection Time: 11/26/14 10:01 AM  Result Value Ref Range Status   Specimen Description BLOOD RIGHT ANTECUBITAL  Final   Special Requests BOTTLES DRAWN AEROBIC ONLY  Final   Culture   Final    NO GROWTH 4 DAYS Performed at [facility]    Report Status PENDING  Incomplete  MRSA PCR Screening     Status: None   Collection Time: 11/26/14 12:16 PM  Result Value Ref Range Status   MRSA by PCR NEGATIVE NEGATIVE Final    Comment:        The GeneXpert MRSA Assay (FDA approved for NASAL specimens only), is one component of a comprehensive MRSA colonization surveillance program. It is not intended to diagnose MRSA infection nor to guide or monitor treatment for MRSA infections.   Culture, blood (routine x 2)     Status: None (Preliminary result)   Collection Time: 11/26/14  1:20 PM  Result Value Ref Range Status   Specimen Description BLOOD RIGHT ARM  Final   Special Requests BOTTLES DRAWN AEROBIC AND ANAEROBIC 10CC EACH  Final   Culture   Final    NO GROWTH 4 DAYS Performed at [facility]    Report Status PENDING  Incomplete     Studies: Dg Chest Port 1 View  11/29/2014  CLINICAL DATA:  69 year old female with hypoxia and acute respiratory failure EXAM: PORTABLE CHEST 1 VIEW COMPARISON:  Prior chest x-ray 11/26/2014 FINDINGS: Cardiac and mediastinal contours within normal limits. Atherosclerotic calcification present in the transverse aorta. Stable mild bronchitic change. Otherwise, the lungs are clear. Persistent hyperinflation consistent with underlying COPD. No acute osseous abnormality. IMPRESSION: Stable chest x-ray without evidence of acute cardiopulmonary process. Electronically Signed   By: Malachy Moan M.D.   On: 11/29/2014 15:30    Scheduled Meds: . aerochamber Z-Stat Plus/medium  1 each Other Once  . aspirin EC  81 mg Oral Daily  . atorvastatin  20 mg Oral q1800  . budesonide-formoterol  2 puff Inhalation BID  . doxycycline  100 mg Oral Q12H   . enoxaparin (LOVENOX) injection  40 mg Subcutaneous Q2000  . furosemide  40 mg Oral Daily  . guaiFENesin  1,200 mg Oral BID  . insulin aspart  0-5 Units Subcutaneous QHS  . insulin aspart  0-9 Units Subcutaneous TID WC  . insulin detemir  5 Units Subcutaneous QHS  . ipratropium-albuterol  3 mL Nebulization Q4H  . irbesartan  150 mg Oral Daily  . methylPREDNISolone (SOLU-MEDROL) injection  60 mg Intravenous Daily  . pantoprazole  40 mg Oral Q breakfast  . sodium chloride  3 mL Intravenous Q12H  . tiotropium  18 mcg Inhalation Daily   Continuous Infusions:   Active Problems:   Malnutrition of moderate degree (HCC)   Chronic diastolic congestive heart failure (HCC)   DM type 2 (diabetes mellitus, type 2) (HCC)   Smoking   Acute exacerbation of chronic obstructive pulmonary  disease (COPD) (HCC)   Acute respiratory failure with hypoxia (HCC)   Leukocytosis   Normocytic anemia   Essential hypertension    Time spent: 25 min    [facility],Curlee Bogan S  [facility] Pager [phone]. If 7PM-7AM, please contact night-coverage at [email or web address], password [email or web address] 11/30/2014, 2:07 PM  LOS: 4 days

## 2014-11-30 NOTE — Progress Notes (Signed)
Pt found on 2lnc, awake, alert, answers questions appropriately.  Per report, pt was taken off earlier in shift to give her a break and to be allowed to eat.  Pt states she does not want to go back on bipap now, but is willing to do so later tonight.  Bipap remains in room on standby.  RT will continue to monitor pt and reapply bipap later tonight.  RN aware.

## 2014-12-01 ENCOUNTER — Inpatient Hospital Stay (HOSPITAL_COMMUNITY): Payer: Medicare Other

## 2014-12-01 LAB — CULTURE, BLOOD (ROUTINE X 2)
CULTURE: NO GROWTH
Culture: NO GROWTH

## 2014-12-01 LAB — BASIC METABOLIC PANEL
Anion gap: 6 (ref 5–15)
BUN: 34 mg/dL — AB (ref 6–20)
CHLORIDE: 94 mmol/L — AB (ref 101–111)
CO2: 46 mmol/L — ABNORMAL HIGH (ref 22–32)
Calcium: 8.6 mg/dL — ABNORMAL LOW (ref 8.9–10.3)
Creatinine, Ser: 0.79 mg/dL (ref 0.44–1.00)
GFR calc Af Amer: >60 mL/min (ref >60)
GFR calc non Af Amer: >60 mL/min (ref >60)
Glucose, Bld: 90 mg/dL (ref 65–99)
POTASSIUM: 4.3 mmol/L (ref 3.5–5.1)
SODIUM: 146 mmol/L — AB (ref 135–145)

## 2014-12-01 LAB — GLUCOSE, CAPILLARY
GLUCOSE-CAPILLARY: 156 mg/dL — AB (ref 65–99)
GLUCOSE-CAPILLARY: 122 mg/dL — AB (ref 65–99)
Glucose-Capillary: 104 mg/dL — ABNORMAL HIGH (ref 65–99)
Glucose-Capillary: 59 mg/dL — ABNORMAL LOW (ref 65–99)
Glucose-Capillary: 98 mg/dL (ref 65–99)

## 2014-12-01 LAB — CBC
HEMATOCRIT: 43.2 % (ref 36.0–46.0)
HEMATOCRIT: 41.9 % (ref 36.0–46.0)
HEMOGLOBIN: 12.2 g/dL (ref 12.0–15.0)
HEMOGLOBIN: 12 g/dL (ref 12.0–15.0)
MCH: 26.4 pg (ref 26.0–34.0)
MCH: 27 pg (ref 26.0–34.0)
MCHC: 28.2 g/dL — AB (ref 30.0–36.0)
MCHC: 28.6 g/dL — ABNORMAL LOW (ref 30.0–36.0)
MCV: 93.5 fL (ref 78.0–100.0)
MCV: 94.2 fL (ref 78.0–100.0)
PLATELETS: 252 10*3/uL (ref 150–400)
Platelets: 229 10*3/uL (ref 150–400)
RBC: 4.62 MIL/uL (ref 3.87–5.11)
RBC: 4.45 MIL/uL (ref 3.87–5.11)
RDW: 14.4 % (ref 11.5–15.5)
RDW: 14.3 % (ref 11.5–15.5)
WBC: 29.2 10*3/uL — ABNORMAL HIGH (ref 4.0–10.5)
WBC: 29 10*3/uL — ABNORMAL HIGH (ref 4.0–10.5)

## 2014-12-01 LAB — BLOOD GAS, ARTERIAL
ACID-BASE EXCESS: 14.8 mmol/L — AB (ref 0.0–2.0)
BICARBONATE: 44.6 meq/L — AB (ref 20.0–24.0)
Drawn by: 33147
O2 Content: 2 L/min
O2 SAT: 90.4 %
PATIENT TEMPERATURE: 98.6
PCO2 ART: 93.7 mmHg — AB (ref 35.0–45.0)
TCO2: 41.7 mmol/L (ref 0–100)
pH, Arterial: 7.299 — ABNORMAL LOW (ref 7.350–7.450)
pO2, Arterial: 65.9 mmHg — ABNORMAL LOW (ref 80.0–100.0)

## 2014-12-01 LAB — PROTIME-INR
INR: 0.91 (ref 0.00–1.49)
PROTHROMBIN TIME: 12.5 s (ref 11.6–15.2)

## 2014-12-01 LAB — COMPREHENSIVE METABOLIC PANEL
ALT: 24 U/L (ref 14–54)
ANION GAP: 9 (ref 5–15)
AST: 19 U/L (ref 15–41)
Albumin: 3.5 g/dL (ref 3.5–5.0)
Alkaline Phosphatase: 53 U/L (ref 38–126)
BILIRUBIN TOTAL: 0.6 mg/dL (ref 0.3–1.2)
BUN: 34 mg/dL — ABNORMAL HIGH (ref 6–20)
CHLORIDE: 92 mmol/L — AB (ref 101–111)
CO2: 44 mmol/L — ABNORMAL HIGH (ref 22–32)
Calcium: 8.6 mg/dL — ABNORMAL LOW (ref 8.9–10.3)
Creatinine, Ser: 0.77 mg/dL (ref 0.44–1.00)
Glucose, Bld: 119 mg/dL — ABNORMAL HIGH (ref 65–99)
POTASSIUM: 4.5 mmol/L (ref 3.5–5.1)
Sodium: 145 mmol/L (ref 135–145)
TOTAL PROTEIN: 6.4 g/dL — AB (ref 6.5–8.1)

## 2014-12-01 MED ORDER — PANTOPRAZOLE SODIUM 40 MG IV SOLR
40.0000 mg | Freq: Every day | INTRAVENOUS | Status: DC
  Administered 2014-12-02 – 2014-12-03 (×2): 40 mg via INTRAVENOUS
  Filled 2014-12-01 (×2): qty 40

## 2014-12-01 MED ORDER — FUROSEMIDE 10 MG/ML IJ SOLN
40.0000 mg | Freq: Every day | INTRAMUSCULAR | Status: DC
  Administered 2014-12-02 – 2014-12-03 (×2): 40 mg via INTRAVENOUS
  Filled 2014-12-01 (×2): qty 4

## 2014-12-01 MED ORDER — VITAMINS A & D EX OINT
TOPICAL_OINTMENT | CUTANEOUS | Status: AC
  Administered 2014-12-01: 13:00:00
  Filled 2014-12-01: qty 5

## 2014-12-01 MED ORDER — INSULIN ASPART 100 UNIT/ML ~~LOC~~ SOLN
0.0000 [IU] | Freq: Three times a day (TID) | SUBCUTANEOUS | Status: DC
  Administered 2014-12-01: 4 [IU] via SUBCUTANEOUS
  Administered 2014-12-02: 3 [IU] via SUBCUTANEOUS
  Administered 2014-12-02 – 2014-12-03 (×2): 4 [IU] via SUBCUTANEOUS
  Administered 2014-12-03: 3 [IU] via SUBCUTANEOUS

## 2014-12-01 MED ORDER — SODIUM CHLORIDE 0.9 % IV SOLN
80.0000 mg | Freq: Once | INTRAVENOUS | Status: AC
  Administered 2014-12-01: 80 mg via INTRAVENOUS
  Filled 2014-12-01: qty 80

## 2014-12-01 MED ORDER — FENTANYL CITRATE (PF) 100 MCG/2ML IJ SOLN
50.0000 ug | INTRAMUSCULAR | Status: DC | PRN
Start: 2014-12-01 — End: 2014-12-02

## 2014-12-01 MED ORDER — IPRATROPIUM BROMIDE 0.02 % IN SOLN
0.5000 mg | Freq: Four times a day (QID) | RESPIRATORY_TRACT | Status: DC
  Administered 2014-12-01 (×2): 0.5 mg via RESPIRATORY_TRACT
  Filled 2014-12-01: qty 2.5

## 2014-12-01 MED ORDER — CHLORHEXIDINE GLUCONATE 0.12 % MT SOLN
15.0000 mL | Freq: Two times a day (BID) | OROMUCOSAL | Status: DC
  Administered 2014-12-02 – 2014-12-03 (×2): 15 mL via OROMUCOSAL
  Filled 2014-12-01: qty 15

## 2014-12-01 MED ORDER — INSULIN ASPART 100 UNIT/ML ~~LOC~~ SOLN: 0.0000 [IU] | Freq: Every day | SUBCUTANEOUS | Status: DC

## 2014-12-01 MED ORDER — METHYLPREDNISOLONE SODIUM SUCC 40 MG IJ SOLR
40.0000 mg | Freq: Three times a day (TID) | INTRAMUSCULAR | Status: DC
  Administered 2014-12-01 – 2014-12-06 (×14): 40 mg via INTRAVENOUS
  Filled 2014-12-01 (×14): qty 1

## 2014-12-01 MED ORDER — BUDESONIDE 0.5 MG/2ML IN SUSP
0.5000 mg | Freq: Two times a day (BID) | RESPIRATORY_TRACT | Status: DC
  Administered 2014-12-01 – 2014-12-12 (×23): 0.5 mg via RESPIRATORY_TRACT
  Filled 2014-12-01 (×22): qty 2

## 2014-12-01 MED ORDER — DEXTROSE 5 % IV SOLN
500.0000 mg | Freq: Every day | INTRAVENOUS | Status: DC
  Administered 2014-12-01 – 2014-12-02 (×2): 500 mg via INTRAVENOUS
  Filled 2014-12-01 (×4): qty 500

## 2014-12-01 MED ORDER — ALBUTEROL SULFATE (2.5 MG/3ML) 0.083% IN NEBU
2.5000 mg | INHALATION_SOLUTION | Freq: Four times a day (QID) | RESPIRATORY_TRACT | Status: DC
  Administered 2014-12-01: 2.5 mg via RESPIRATORY_TRACT
  Filled 2014-12-01: qty 3

## 2014-12-01 MED ORDER — IPRATROPIUM-ALBUTEROL 0.5-2.5 (3) MG/3ML IN SOLN
3.0000 mL | Freq: Four times a day (QID) | RESPIRATORY_TRACT | Status: DC
  Administered 2014-12-01 – 2014-12-03 (×8): 3 mL via RESPIRATORY_TRACT
  Filled 2014-12-01 (×8): qty 3

## 2014-12-01 MED ORDER — DEXMEDETOMIDINE HCL IN NACL 200 MCG/50ML IV SOLN
0.4000 ug/kg/h | INTRAVENOUS | Status: DC
  Administered 2014-12-01: 0.7 ug/kg/h via INTRAVENOUS
  Administered 2014-12-01: 0.4 ug/kg/h via INTRAVENOUS
  Administered 2014-12-01: 1 ug/kg/h via INTRAVENOUS
  Administered 2014-12-02: 0.8 ug/kg/h via INTRAVENOUS
  Filled 2014-12-01: qty 100
  Filled 2014-12-01 (×4): qty 50

## 2014-12-01 NOTE — Progress Notes (Addendum)
PULMONARY / CRITICAL CARE MEDICINE   Name: Nicole Maxwell MRN: [id number] DOB: 08-Aug-1945    ADMISSION DATE:  11/26/2014 CONSULTATION DATE:  11/29/2014 REFERRING MD :  Sharl MaLama  CHIEF COMPLAINT:  Short of breath  INITIAL PRESENTATION:  69 yo female smoker admitted with dyspnea, cough, wheeze from AECOPD and acute on chronic diastolic dysfx.  Followed by Dr. Marchelle Gearingamaswamy as outpt for GOLD D COPD (FEV1 0.5/29%, DLCO 27% from 04/13/10)  STUDIES:   SIGNIFICANT EVENTS: 11/01 Admit 11/05 altered mental status 11/06 BiPAP, precedex  SUBJECTIVE:  Unable to wean off BiPAP >> very sleepy w/o BiPAP.  VITAL SIGNS: Temp:  [98.1 F (36.7 C)-98.8 F (37.1 C)] 98.3 F (36.8 C) (11/06 0800) Pulse Rate:  [54-114] 97 (11/06 0700) Resp:  [17-26] 19 (11/06 0700) BP: (122-156)/(41-63) 146/55 mmHg (11/06 0700) SpO2:  [23 %-100 %] 23 % (11/06 0800) FiO2 (%):  [30 %] 30 % (11/06 0827) Weight:  [108 lb 14.5 oz (49.4 kg)-110 lb 10.7 oz (50.2 kg)] 108 lb 14.5 oz (49.4 kg) (11/06 0400) VENTILATOR SETTINGS: Vent Mode:  [-] BIPAP FiO2 (%):  [30 %] 30 % Set Rate:  [15 bmp] 15 bmp PEEP:  [6 cmH20] 6 cmH20 Pressure Support:  [6 cmH20] 6 cmH20 Plateau Pressure:  [8 cmH20-15 cmH20] 12 cmH20 INTAKE / OUTPUT:  Intake/Output Summary (Last 24 hours) at 12/01/14 1005 Last data filed at 12/01/14 0700  Gross per 24 hour  Intake    183 ml  Output   2200 ml  Net  -2017 ml    PHYSICAL EXAMINATION: General:  thin Neuro:  Agitated at times, follows commands HEENT:  BiPAP mask in place Cardiovascular:  regular Lungs:  B/l rhonchi, poor air movement, no wheeze Abdomen:  Soft, non tender Musculoskeletal:  No edema, decreased muscle bulk Skin:  No rashes  LABS:  CBC  Recent Labs Lab 11/29/14 0455 12/01/14 0107 12/01/14 0354  WBC 19.9* 29.2* 29.0*  HGB 11.0* 12.2 12.0  HCT 37.5 43.2 41.9  PLT 252 252 229   Coag's  Recent Labs Lab 11/26/14 1542 12/01/14 0107  APTT 26  --   INR 0.93 0.91    BMET  Recent Labs Lab 11/30/14 1100 12/01/14 0107 12/01/14 0354  NA 147* 145 146*  K 4.0 4.5 4.3  CL 98* 92* 94*  CO2 45* 44* 46*  BUN 36* 34* 34*  CREATININE 0.68 0.77 0.79  GLUCOSE 109* 119* 90   Electrolytes  Recent Labs Lab 11/28/14 0350  11/30/14 1100 12/01/14 0107 12/01/14 0354  CALCIUM 7.8*  < > 8.5* 8.6* 8.6*  MG 1.3*  --   --   --   --   < > = values in this interval not displayed.   Sepsis Markers  Recent Labs Lab 11/26/14 1014 11/26/14 1542 11/26/14 1830  LATICACIDVEN 0.66 1.3 1.7  PROCALCITON  --  <0.10  --    ABG  Recent Labs Lab 11/26/14 1550 11/30/14 1428 12/01/14 0800  PHART 7.326* 7.219* 7.299*  PCO2ART 64.7* 113* 93.7*  PO2ART 77.8* 81.1 65.9*   Liver Enzymes  Recent Labs Lab 11/27/14 0340 12/01/14 0107  AST 17 19  ALT 18 24  ALKPHOS 49 53  BILITOT 0.6 0.6  ALBUMIN 3.1* 3.5   Glucose  Recent Labs Lab 11/29/14 2134 11/30/14 0739 11/30/14 1127 11/30/14 1541 11/30/14 2141 12/01/14 0821  GLUCAP 90 85 96 129* 156* 104*    Imaging Dg Abd Portable 1v  12/01/2014  CLINICAL DATA:  Mid to lower abdominal pain  today. History of appendectomy. EXAM: PORTABLE ABDOMEN - 1 VIEW COMPARISON:  Abdomen and pelvis CT, 07/15/2010 FINDINGS: There is no bowel dilation to suggest obstruction or generalized adynamic ileus. There is moderate increased stool in the colon. No evidence of renal or ureteral stones. Soft tissues are unremarkable. There are degenerative changes of visualized spine. Patient has had a previous L4-L5 posterior lumbar spine fusion. IMPRESSION: 1. No acute finding.  No evidence of bowel obstruction. 2. Moderate increased stool in the colon. Electronically Signed   By: Amie Portland M.D.   On: 12/01/2014 01:09     ASSESSMENT / PLAN:  PULMONARY A: Acute on chronic hypoxic/hypercapnic respiratory failure 2nd to AECOPD. Tobacco abuse. P:   Adjust oxygen to keep SpO2 89 to 95% BiPAP as tolerated >> might need  intubation F/u CXR Try to limit ABG checks Scheduled pulmicort, albuterol, ipratropium Smoking cessation  CARDIOVASCULAR A:  Acute on chronic diastolic dysfunction. Hx of HTN, HLD. P:  Continue ASA, lipitor, lasix, avapro  RENAL A:   Compensatory metabolic alkalosis. P:   F/u BMET  GASTROINTESTINAL A:   Protein calorie malnutrition. P:   Clear liquid diet until respiratory status more stable Protonix for SUP  HEMATOLOGIC A:   Leukocytosis. P:  F/u CBC Lovenox for DVT prevention  INFECTIOUS A:   AECOPD. P:   Day 6/7 Abx, change to zithromax IV 11/06  ENDOCRINE A:   DM with steroid induced hyperglycemia.   P:   SSI  NEUROLOGIC A:   Acute encephalopathy 2nd to respiratory failure. Hx of chronic back pain, ETOH (in remission), Anxiety. P:   Add precedex 11/06 RASS goal: -1 PRN fentanyl Hold outpt xanax, norco  Updated pt's family at bedside.  CC time 36 minutes.  Coralyn Helling, MD [facility] Pulmonary/Critical Care 12/01/2014, 10:28 AM Pager:  [phone] After 3pm call: [phone]

## 2014-12-01 NOTE — Progress Notes (Signed)
Pt remains on BIPAP/NIV.

## 2014-12-01 NOTE — Progress Notes (Signed)
Rt placed back on BIPAP/NIV due to ABG.

## 2014-12-01 NOTE — Progress Notes (Signed)
Pt non-compliant with Bi-Pap overnight.  Also, attempted to get OOB several times asking to go home.  Hospitalist notified with orders for an ABG.  One gas attempted by RT, but second attempt was refused.  Eventually, pt asked to return to bed and said she would talk to Dr. Bonney Aidamaswamey in the am.  Pt is resting comfortably now on 2 LRR 27 and sats are 95.  Will continue to assess for signs of hypoxia and changes in respiratory status and mental status.

## 2014-12-01 NOTE — Progress Notes (Signed)
Pt remains on 2lnc, HR 95, rr23, spo2 98%.  No respiratory distress noted at this time.  Pt refuses to put bipap mask back on.  Pt has been non-compliant with bipap overnight, wearing it less than 3 hours total.  Bipap remains in room on standby as needed.  RN aware.

## 2014-12-01 NOTE — Progress Notes (Signed)
[facility] PROGRESS NOTE  Nicole Maxwell HYQ:[id number] DOB: 01-Nov-1945 DOA: 11/26/2014 PCP: Nicole Clan, MD  Assessment/Plan: 1. Acute hypercarbic respiratory failure- secondary to COPD exacerbation ,patient had ABG which showed elevated PCO2 on 11/26/2014 patient initially required BiPAP which was then taken off and patient did well on 2 L oxygen with nasal cannula.  IV Solu Medrol 60 mg every 6 hours, DuoNeb nebulizers. Mucinex 1 tablet by mouth twice a day. Patient was seen by pulmonary in consultation, yesterday patient was very lethargic ABG revealed hypercarbia with PCO2 113, patient wasn't transferred to stepdown and started on BiPAP. Currently patient is on BiPAP. Patient started on Zithromax. 2. Acute on chronic diastolic heart failure-patient's echocardiogram from December 2015 shows grade 1 diastolic dysfunction, chest x-ray shows mild low-grade CHF. Patient started on Lasix 20 mg IV every 12 hours. Urine output of 1975 over past 24 hours. Lasix 40 mg by mouth daily, extra dose of Lasix given by pulmonary.  3. Diabetes mellitus- blood glucose is well controlled, continue sliding scale insulin, Levemir 5 units subcutaneous daily.  4. Leukocytosis- white count elevated to 29,000, likely from steroids. 5. Essential hypertension-blood pressure is well controlled, continue Avapro. 6. Protein calorie malnutrition- nutrition consult to assess patient's caloric needs. 7. DVT prophylaxis- heparin  Code Status:  Full code Family Communication:  No family at bedside Disposition Plan: home when medically stable   Consultants:  None  Procedures:  None  Antibiotics:  Doxycycline  HPI/Subjective: 69 y.o. female with history of COPD, O2 dependent on chronic steroids was brought to the hospital with worsening shortness of breath for past 4 days. Patient found to be hypoxic and hypercarbic with leukocytosis. Started on IV steroids and antibiotics.  Patient has been requiring BiPAP,  feels uncomfortable on BiPAP. After BiPAP was removed patient says that she is beginning little better. Still coughing up yellow-colored phlegm. Patient currently on nasal cannula requiring 4 L oxygen. This morning patient continues to be lethargic.  is a Objective: Filed Vitals:   12/01/14 1300  BP:   Pulse: 70  Temp: 98.4 F (36.9 C)  Resp: 17    Intake/Output Summary (Last 24 hours) at 12/01/14 1441 Last data filed at 12/01/14 1300  Gross per 24 hour  Intake 461.54 ml  Output   2000 ml  Net -1538.46 ml   Filed Weights   11/28/14 0400 11/30/14 1524 12/01/14 0400  Weight: 53.8 kg (118 lb 9.7 oz) 50.2 kg (110 lb 10.7 oz) 49.4 kg (108 lb 14.5 oz)    Exam:   General:  Appears in no acute distress. Feels better after BiPAP taken off.  Cardiovascular: S1-S2 regular  Respiratory: Decreased breath sounds bilaterally  Abdomen: Soft, nontender, no organomegaly  Musculoskeletal: No edema/cyanosis/clubbing of the lower extremities   Data Reviewed: Basic Metabolic Panel:  Recent Labs Lab 11/28/14 0350 11/29/14 0455 11/30/14 1100 12/01/14 0107 12/01/14 0354  NA 140 142 147* 145 146*  K 3.6 4.2 4.0 4.5 4.3  CL 98* 99* 98* 92* 94*  CO2 36* 38* 45* 44* 46*  GLUCOSE 176* 121* 109* 119* 90  BUN 29* 24* 36* 34* 34*  CREATININE 0.71 0.58 0.68 0.77 0.79  CALCIUM 7.8* 8.1* 8.5* 8.6* 8.6*  MG 1.3*  --   --   --   --    Liver Function Tests:  Recent Labs Lab 11/27/14 0340 12/01/14 0107  AST 17 19  ALT 18 24  ALKPHOS 49 53  BILITOT 0.6 0.6  PROT 6.0* 6.4*  ALBUMIN  3.1* 3.5   CBC:  Recent Labs Lab 11/26/14 1005 11/27/14 0340 11/29/14 0455 12/01/14 0107 12/01/14 0354  WBC 15.5* 13.1* 19.9* 29.2* 29.0*  NEUTROABS 12.9*  --   --   --   --   HGB 10.9* 10.2* 11.0* 12.2 12.0  HCT 35.9* 34.4* 37.5 43.2 41.9  MCV 88.9 90.8 90.8 93.5 94.2  PLT 244 229 252 252 229   Cardiac Enzymes: No results for input(s): CKTOTAL, CKMB, CKMBINDEX, TROPONINI in the last 168  hours. BNP (last 3 results)  Recent Labs  11/27/14 1458  BNP 87.7    ProBNP (last 3 results)  Recent Labs  01/01/14 1213 01/03/14 0850  PROBNP 364.2* 724.5*    CBG:  Recent Labs Lab 11/30/14 0739 11/30/14 1127 11/30/14 1541 11/30/14 2141 12/01/14 0821  GLUCAP 85 96 129* 156* 104*    Recent Results (from the past 240 hour(s))  Culture, blood (routine x 2)     Status: None   Collection Time: 11/26/14 10:01 AM  Result Value Ref Range Status   Specimen Description BLOOD RIGHT ANTECUBITAL  Final   Special Requests BOTTLES DRAWN AEROBIC ONLY 5ML  Final   Culture   Final    NO GROWTH 5 DAYS [facility]    Report Status 12/01/2014 FINAL  Final  MRSA PCR Screening     Status: None   Collection Time: 11/26/14 12:16 PM  Result Value Ref Range Status   MRSA by PCR NEGATIVE NEGATIVE Final    Comment:        The GeneXpert MRSA Assay (FDA approved for NASAL specimens only), is one component of a comprehensive MRSA colonization surveillance program. It is not intended to diagnose MRSA infection nor to guide or monitor treatment for MRSA infections.   Culture, blood (routine x 2)     Status: None   Collection Time: 11/26/14  1:20 PM  Result Value Ref Range Status   Specimen Description BLOOD RIGHT ARM  Final   Special Requests BOTTLES DRAWN AEROBIC AND ANAEROBIC 10CC EACH  Final   Culture   Final    NO GROWTH 5 DAYS Performed at [facility]    Report Status 12/01/2014 FINAL  Final     Studies: Dg Chest Port 1 View  11/29/2014  CLINICAL DATA:  69 year old female with hypoxia and acute respiratory failure EXAM: PORTABLE CHEST 1 VIEW COMPARISON:  Prior chest x-ray 11/26/2014 FINDINGS: Cardiac and mediastinal contours within normal limits. Atherosclerotic calcification present in the transverse aorta. Stable mild bronchitic change. Otherwise, the lungs are clear. Persistent hyperinflation consistent with underlying COPD. No acute osseous  abnormality. IMPRESSION: Stable chest x-ray without evidence of acute cardiopulmonary process. Electronically Signed   By: Malachy MoanHeath  McCullough M.D.   On: 11/29/2014 15:30   Dg Abd Portable 1v  12/01/2014  CLINICAL DATA:  Mid to lower abdominal pain today. History of appendectomy. EXAM: PORTABLE ABDOMEN - 1 VIEW COMPARISON:  Abdomen and pelvis CT, 07/15/2010 FINDINGS: There is no bowel dilation to suggest obstruction or generalized adynamic ileus. There is moderate increased stool in the colon. No evidence of renal or ureteral stones. Soft tissues are unremarkable. There are degenerative changes of visualized spine. Patient has had a previous L4-L5 posterior lumbar spine fusion. IMPRESSION: 1. No acute finding.  No evidence of bowel obstruction. 2. Moderate increased stool in the colon. Electronically Signed   By: Amie Portlandavid  Ormond M.D.   On: 12/01/2014 01:09    Scheduled Meds: . albuterol  2.5 mg  Nebulization Q6H  . aspirin EC  81 mg Oral Daily  . atorvastatin  20 mg Oral q1800  . azithromycin  500 mg Intravenous Daily  . budesonide (PULMICORT) nebulizer solution  0.5 mg Nebulization BID  . enoxaparin (LOVENOX) injection  40 mg Subcutaneous Q2000  . [START ON 12/02/2014] furosemide  40 mg Intravenous Daily  . insulin aspart  0-20 Units Subcutaneous TID WC  . insulin aspart  0-5 Units Subcutaneous QHS  . ipratropium  0.5 mg Nebulization Q6H  . irbesartan  150 mg Oral Daily  . methylPREDNISolone (SOLU-MEDROL) injection  40 mg Intravenous 3 times per day  . pantoprazole (PROTONIX) IV  40 mg Intravenous Daily  . sodium chloride  3 mL Intravenous Q12H   Continuous Infusions: . dexmedetomidine 1.2 mcg/kg/hr (12/01/14 1302)    Active Problems:   Malnutrition of moderate degree (HCC)   Chronic diastolic congestive heart failure (HCC)   DM type 2 (diabetes mellitus, type 2) (HCC)   Smoking   Acute exacerbation of chronic obstructive pulmonary disease (COPD) (HCC)   Acute respiratory failure with  hypoxia (HCC)   Leukocytosis   Normocytic anemia   Essential hypertension    Time spent: 25 min    [facility]  [facility] Pager [phone]. If 7PM-7AM, please contact night-coverage at [email or web address], password [facility] 12/01/2014, 2:41 PM  LOS: 5 days

## 2014-12-01 NOTE — Progress Notes (Signed)
This RT attempted abg x1 but was unsuccessful.  Pt refused any more attempts by myself and other RT.  RN aware, at bedside.

## 2014-12-02 ENCOUNTER — Inpatient Hospital Stay (HOSPITAL_COMMUNITY): Payer: Medicare Other

## 2014-12-02 DIAGNOSIS — N183 Chronic kidney disease, stage 3 (moderate): Secondary | ICD-10-CM

## 2014-12-02 DIAGNOSIS — E1122 Type 2 diabetes mellitus with diabetic chronic kidney disease: Secondary | ICD-10-CM

## 2014-12-02 LAB — GLUCOSE, CAPILLARY
GLUCOSE-CAPILLARY: 186 mg/dL — AB (ref 65–99)
GLUCOSE-CAPILLARY: 131 mg/dL — AB (ref 65–99)
GLUCOSE-CAPILLARY: 111 mg/dL — AB (ref 65–99)
GLUCOSE-CAPILLARY: 160 mg/dL — AB (ref 65–99)

## 2014-12-02 LAB — BASIC METABOLIC PANEL
Anion gap: 6 (ref 5–15)
BUN: 40 mg/dL — AB (ref 6–20)
CALCIUM: 8.4 mg/dL — AB (ref 8.9–10.3)
CO2: 44 mmol/L — ABNORMAL HIGH (ref 22–32)
CREATININE: 0.7 mg/dL (ref 0.44–1.00)
Chloride: 91 mmol/L — ABNORMAL LOW (ref 101–111)
GFR calc non Af Amer: >60 mL/min (ref >60)
Glucose, Bld: 166 mg/dL — ABNORMAL HIGH (ref 65–99)
Potassium: 4.5 mmol/L (ref 3.5–5.1)
SODIUM: 141 mmol/L (ref 135–145)

## 2014-12-02 LAB — BLOOD GAS, ARTERIAL
ACID-BASE EXCESS: 12.6 mmol/L — AB (ref 0.0–2.0)
Bicarbonate: 38.3 mEq/L — ABNORMAL HIGH (ref 20.0–24.0)
Drawn by: 257701
O2 CONTENT: 1 L/min
O2 SAT: 94.4 %
PATIENT TEMPERATURE: 98.6
PCO2 ART: 55.1 mmHg — AB (ref 35.0–45.0)
PO2 ART: 76 mmHg — AB (ref 80.0–100.0)
TCO2: 34.5 mmol/L (ref 0–100)
pH, Arterial: 7.457 — ABNORMAL HIGH (ref 7.350–7.450)

## 2014-12-02 LAB — CBC
HCT: 37.2 % (ref 36.0–46.0)
Hemoglobin: 10.9 g/dL — ABNORMAL LOW (ref 12.0–15.0)
MCH: 26.5 pg (ref 26.0–34.0)
MCHC: 29.3 g/dL — ABNORMAL LOW (ref 30.0–36.0)
MCV: 90.3 fL (ref 78.0–100.0)
PLATELETS: 217 10*3/uL (ref 150–400)
RBC: 4.12 MIL/uL (ref 3.87–5.11)
RDW: 14.5 % (ref 11.5–15.5)
WBC: 18.9 10*3/uL — AB (ref 4.0–10.5)

## 2014-12-02 MED ORDER — HALOPERIDOL LACTATE 5 MG/ML IJ SOLN
INTRAMUSCULAR | Status: AC
  Filled 2014-12-02: qty 1

## 2014-12-02 MED ORDER — HALOPERIDOL LACTATE 5 MG/ML IJ SOLN
5.0000 mg | Freq: Once | INTRAMUSCULAR | Status: AC
  Administered 2014-12-02: 5 mg via INTRAMUSCULAR

## 2014-12-02 MED ORDER — SODIUM CHLORIDE 0.9 % IV SOLN
INTRAVENOUS | Status: DC
  Administered 2014-12-02: via INTRAVENOUS
  Administered 2014-12-03: 10 mL/h via INTRAVENOUS
  Administered 2014-12-03: 750 mL/h via INTRAVENOUS

## 2014-12-02 MED ORDER — DEXMEDETOMIDINE HCL IN NACL 200 MCG/50ML IV SOLN
0.4000 ug/kg/h | INTRAVENOUS | Status: DC
  Administered 2014-12-02 (×2): 1.9 ug/kg/h via INTRAVENOUS
  Administered 2014-12-02: 1.2 ug/kg/h via INTRAVENOUS
  Administered 2014-12-02: 1.7 ug/kg/h via INTRAVENOUS
  Administered 2014-12-02: 0.8 ug/kg/h via INTRAVENOUS
  Administered 2014-12-03 (×2): 1.8 ug/kg/h via INTRAVENOUS
  Administered 2014-12-03 (×3): 2 ug/kg/h via INTRAVENOUS
  Administered 2014-12-03: 1.8 ug/kg/h via INTRAVENOUS
  Filled 2014-12-02 (×13): qty 50

## 2014-12-02 NOTE — Progress Notes (Signed)
Discussed with Dr Vassie LollAlva, patient will be taken over by PCCM.

## 2014-12-02 NOTE — Progress Notes (Signed)
eLink Physician-Brief Progress Note Patient Name: Nicole Maxwell DOB: [birth year] MRN: [id number]   Date of Service  12/02/2014  HPI/Events of Note  Continued agitation   eICU Interventions  Repeat Haldol IM now Increase upper limit of precedex Check ABG     Intervention Category Intermediate Interventions: Change in mental status - evaluation and management  Max FickleDouglas Arbor Cohen 12/02/2014, 6:42 PM

## 2014-12-02 NOTE — Progress Notes (Signed)
PULMONARY / CRITICAL CARE MEDICINE   Name: Nicole Maxwell MRN: [id number] DOB: 1945-12-16    ADMISSION DATE:  11/26/2014 CONSULTATION DATE:  11/29/2014 REFERRING MD :  [name]  CHIEF COMPLAINT:  Short of breath  INITIAL PRESENTATION:  69 yo female smoker admitted with dyspnea, cough, wheeze from AECOPD and acute on chronic diastolic dysfx.  Followed by Dr. Marchelle Gearing as outpt for GOLD D COPD (FEV1 0.5/29%, DLCO 27% from 04/13/10)  STUDIES:   SIGNIFICANT EVENTS: 11/01 Admit 11/05 altered mental status 11/06 BiPAP, precedex  SUBJECTIVE:  Agitated requiring precedex Avapro held for low BP onprecedex afebrile  VITAL SIGNS: Temp:  [97.3 F (36.3 C)-98.4 F (36.9 C)] 97.3 F (36.3 C) (11/07 0400) Pulse Rate:  [58-94] 58 (11/07 0751) Resp:  [15-23] 22 (11/07 0751) BP: (81-145)/(45-68) 144/64 mmHg (11/07 0751) SpO2:  [95 %-100 %] 100 % (11/07 0751) FiO2 (%):  [30 %] 30 % (11/07 0751) Weight:  [51.3 kg (113 lb 1.5 oz)] 51.3 kg (113 lb 1.5 oz) (11/07 0400) VENTILATOR SETTINGS: Vent Mode:  [-] BIPAP FiO2 (%):  [30 %] 30 % Set Rate:  [15 bmp] 15 bmp PEEP:  [6 cmH20] 6 cmH20 Pressure Support:  [6 cmH20] 6 cmH20 Plateau Pressure:  [10 cmH20-14 cmH20] 10 cmH20 INTAKE / OUTPUT:  Intake/Output Summary (Last 24 hours) at 12/02/14 0850 Last data filed at 12/02/14 0800  Gross per 24 hour  Intake 734.78 ml  Output   1055 ml  Net -320.22 ml    PHYSICAL EXAMINATION: General:  thin Neuro:  Agitated at times, follows commands HEENT:  BiPAP mask in place Cardiovascular:  regular Lungs:  B/l rhonchi, poor air movement, no wheeze Abdomen:  Soft, non tender Musculoskeletal:  No edema, decreased muscle bulk Skin:  No rashes  LABS:  CBC  Recent Labs Lab 12/01/14 0107 12/01/14 0354 12/02/14 0334  WBC 29.2* 29.0* 18.9*  HGB 12.2 12.0 10.9*  HCT 43.2 41.9 37.2  PLT 252 229 217   Coag's  Recent Labs Lab 11/26/14 1542 12/01/14 0107  APTT 26  --   INR 0.93 0.91    BMET  Recent Labs Lab 12/01/14 0107 12/01/14 0354 12/02/14 0334  NA 145 146* 141  K 4.5 4.3 4.5  CL 92* 94* 91*  CO2 44* 46* 44*  BUN 34* 34* 40*  CREATININE 0.77 0.79 0.70  GLUCOSE 119* 90 166*   Electrolytes  Recent Labs Lab 11/28/14 0350  12/01/14 0107 12/01/14 0354 12/02/14 0334  CALCIUM 7.8*  < > 8.6* 8.6* 8.4*  MG 1.3*  --   --   --   --   < > = values in this interval not displayed.   Sepsis Markers  Recent Labs Lab 11/26/14 1014 11/26/14 1542 11/26/14 1830  LATICACIDVEN 0.66 1.3 1.7  PROCALCITON  --  <0.10  --    ABG  Recent Labs Lab 11/26/14 1550 11/30/14 1428 12/01/14 0800  PHART 7.326* 7.219* 7.299*  PCO2ART 64.7* 113* 93.7*  PO2ART 77.8* 81.1 65.9*   Liver Enzymes  Recent Labs Lab 11/27/14 0340 12/01/14 0107  AST 17 19  ALT 18 24  ALKPHOS 49 53  BILITOT 0.6 0.6  ALBUMIN 3.1* 3.5   Glucose  Recent Labs Lab 12/01/14 0821 12/01/14 1313 12/01/14 1703 12/01/14 1807 12/01/14 2247 12/02/14 0836  GLUCAP 104* 156* 59* 98 122* 186*    Imaging Dg Chest Port 1 View  12/02/2014  CLINICAL DATA:  COPD exacerbation EXAM: PORTABLE CHEST 1 VIEW COMPARISON:  Three days ago FINDINGS:  Chronic hyperinflation and interstitial coarsening consistent with COPD. Trace pleural effusions likely, even when accounting for flattened diaphragm and baseline costophrenic sulcus blunting. No consolidation, edema, or air leak. Normal heart size and mediastinal contours. IMPRESSION: Probable trace pleural effusions. COPD. Electronically Signed   By: Marnee SpringJonathon  Watts M.D.   On: 12/02/2014 06:19     ASSESSMENT / PLAN:  PULMONARY A: Acute on chronic hypoxic/hypercapnic respiratory failure 2nd to AECOPD. Tobacco abuse. P:   Adjust oxygen to keep SpO2 89 to 95% BiPAP as tolerated , ABG only if mental status poor Scheduled pulmicort, albuterol, ipratropium Smoking cessation  CARDIOVASCULAR A:  Acute on chronic diastolic dysfunction. Hx of HTN, HLD. P:   Continue ASA, lipitor, lasix, avapro  RENAL A:   Compensatory metabolic alkalosis. P:   F/u BMET  GASTROINTESTINAL A:   Protein calorie malnutrition. P:   Clear liquid diet until respiratory status more stable Protonix for SUP  HEMATOLOGIC A:   Leukocytosis. P:  F/u CBC Lovenox for DVT prevention  INFECTIOUS A:   AECOPD. P:   Day 7 Abx, dc zithromax   ENDOCRINE A:   DM with steroid induced hyperglycemia.   P:   SSI  NEUROLOGIC A:   Acute encephalopathy 2nd to respiratory failure. Hx of chronic back pain, ETOH (in remission), Anxiety. P:   Ct  precedex since  11/06 RASS goal: 0 PRN fentanyl Hold outpt xanax, norco  Updated pt's family at bedside.11/6  Summary - Remains bipap dependent - high risk intubation PCCM can take over caer  The patient is critically ill with multiple organ systems failure and requires high complexity decision making for assessment and support, frequent evaluation and titration of therapies, application of advanced monitoring technologies and extensive interpretation of multiple databases. Critical Care Time devoted to patient care services described in this note independent of APP time is 35 minutes.    Cyril Mourningakesh Warnie Belair MD. Tonny BollmanFCCP. [facility] Pager [phone] If no response call [phone]      12/02/2014, 8:50 AM

## 2014-12-02 NOTE — Progress Notes (Signed)
Date: December 02, 2014 Chart reviewed for concurrent status and case management needs. Will continue to follow patient for changes and needs:  Remains on Bi-pap Marcelle Smilinghonda Davis, RN, BSN, [place]   [phone]

## 2014-12-02 NOTE — Progress Notes (Signed)
eLink Physician-Brief Progress Note Patient Name: Nicole Maxwell DOB: Jun 13, 1945 MRN: [id number]   Date of Service  12/02/2014  HPI/Events of Note  Agitation and anxiety  eICU Interventions  Haldol IM now If no improvement then check ABG for hypercarbia     Intervention Category Minor Interventions: Agitation / anxiety - evaluation and management  Max FickleDouglas Edy Mcbane 12/02/2014, 5:36 PM

## 2014-12-02 NOTE — Progress Notes (Signed)
Patient taken off Bipap and placed on [place] 1lpm. Hr 96, RR 19, O2 sat.%. Order changed to Bipap prn for respiratory distress

## 2014-12-03 ENCOUNTER — Inpatient Hospital Stay (HOSPITAL_COMMUNITY): Payer: Medicare Other

## 2014-12-03 DIAGNOSIS — E440 Moderate protein-calorie malnutrition: Secondary | ICD-10-CM

## 2014-12-03 DIAGNOSIS — J9602 Acute respiratory failure with hypercapnia: Secondary | ICD-10-CM

## 2014-12-03 LAB — BLOOD GAS, ARTERIAL
ACID-BASE EXCESS: 5.4 mmol/L — AB (ref 0.0–2.0)
Acid-Base Excess: 11.4 mmol/L — ABNORMAL HIGH (ref 0.0–2.0)
Bicarbonate: 36.9 mEq/L — ABNORMAL HIGH (ref 20.0–24.0)
Bicarbonate: 33.2 mEq/L — ABNORMAL HIGH (ref 20.0–24.0)
DRAWN BY: 257701
DRAWN BY: 257701
FIO2: 0.3
FIO2: 1
LHR: 16 {breaths}/min
O2 SAT: 92.6 %
O2 SAT: 99.4 %
PATIENT TEMPERATURE: 98.6
PCO2 ART: 70.3 mmHg — AB (ref 35.0–45.0)
PEEP: 6 cmH2O
PEEP: 5 cmH2O
PH ART: 7.459 — AB (ref 7.350–7.450)
PH ART: 7.296 — AB (ref 7.350–7.450)
PO2 ART: 420 mmHg — AB (ref 80.0–100.0)
Patient temperature: 98.6
Pressure control: 6 cmH2O
RATE: 15 resp/min
TCO2: 33.1 mmol/L (ref 0–100)
TCO2: 31.1 mmol/L (ref 0–100)
VT: 430 mL
pCO2 arterial: 52.8 mmHg — ABNORMAL HIGH (ref 35.0–45.0)
pO2, Arterial: 69.5 mmHg — ABNORMAL LOW (ref 80.0–100.0)

## 2014-12-03 LAB — BASIC METABOLIC PANEL
Anion gap: 9 (ref 5–15)
BUN: 28 mg/dL — ABNORMAL HIGH (ref 6–20)
CHLORIDE: 93 mmol/L — AB (ref 101–111)
CO2: 38 mmol/L — ABNORMAL HIGH (ref 22–32)
CREATININE: 0.62 mg/dL (ref 0.44–1.00)
Calcium: 8.8 mg/dL — ABNORMAL LOW (ref 8.9–10.3)
Glucose, Bld: 171 mg/dL — ABNORMAL HIGH (ref 65–99)
POTASSIUM: 4.3 mmol/L (ref 3.5–5.1)
SODIUM: 140 mmol/L (ref 135–145)

## 2014-12-03 LAB — GLUCOSE, CAPILLARY
GLUCOSE-CAPILLARY: 146 mg/dL — AB (ref 65–99)
GLUCOSE-CAPILLARY: 154 mg/dL — AB (ref 65–99)
GLUCOSE-CAPILLARY: 135 mg/dL — AB (ref 65–99)
Glucose-Capillary: 179 mg/dL — ABNORMAL HIGH (ref 65–99)
Glucose-Capillary: 68 mg/dL (ref 65–99)

## 2014-12-03 LAB — CBC
HCT: 39.3 % (ref 36.0–46.0)
Hemoglobin: 11.8 g/dL — ABNORMAL LOW (ref 12.0–15.0)
MCH: 26 pg (ref 26.0–34.0)
MCHC: 30 g/dL (ref 30.0–36.0)
MCV: 86.8 fL (ref 78.0–100.0)
PLATELETS: 242 10*3/uL (ref 150–400)
RBC: 4.53 MIL/uL (ref 3.87–5.11)
RDW: 14.1 % (ref 11.5–15.5)
WBC: 16.7 10*3/uL — AB (ref 4.0–10.5)

## 2014-12-03 LAB — TRIGLYCERIDES: Triglycerides: 162 mg/dL — ABNORMAL HIGH (ref <150)

## 2014-12-03 MED ORDER — HALOPERIDOL LACTATE 5 MG/ML IJ SOLN: 1.0000 mg | INTRAMUSCULAR | Status: DC | PRN

## 2014-12-03 MED ORDER — CHLORHEXIDINE GLUCONATE 0.12% ORAL RINSE (MEDLINE KIT): 15.0000 mL | Freq: Two times a day (BID) | OROMUCOSAL | Status: DC

## 2014-12-03 MED ORDER — FENTANYL CITRATE (PF) 100 MCG/2ML IJ SOLN
100.0000 ug | Freq: Once | INTRAMUSCULAR | Status: AC
  Administered 2014-12-03: 100 ug via INTRAVENOUS

## 2014-12-03 MED ORDER — FENTANYL BOLUS VIA INFUSION
25.0000 ug | INTRAVENOUS | Status: DC | PRN
  Administered 2014-12-04 (×2): 25 ug via INTRAVENOUS
  Filled 2014-12-03: qty 25

## 2014-12-03 MED ORDER — PROPOFOL 1000 MG/100ML IV EMUL
INTRAVENOUS | Status: AC
  Filled 2014-12-03: qty 100

## 2014-12-03 MED ORDER — LORAZEPAM 2 MG/ML IJ SOLN
INTRAMUSCULAR | Status: AC
  Filled 2014-12-03: qty 1

## 2014-12-03 MED ORDER — CHLORHEXIDINE GLUCONATE 0.12% ORAL RINSE (MEDLINE KIT)
15.0000 mL | Freq: Two times a day (BID) | OROMUCOSAL | Status: DC
  Administered 2014-12-03 – 2014-12-12 (×13): 15 mL via OROMUCOSAL

## 2014-12-03 MED ORDER — ROCURONIUM BROMIDE 50 MG/5ML IV SOLN
INTRAVENOUS | Status: AC
  Filled 2014-12-03: qty 2

## 2014-12-03 MED ORDER — ETOMIDATE 2 MG/ML IV SOLN
INTRAVENOUS | Status: AC
  Filled 2014-12-03: qty 20

## 2014-12-03 MED ORDER — MIDAZOLAM HCL 2 MG/2ML IJ SOLN
2.0000 mg | Freq: Once | INTRAMUSCULAR | Status: AC
  Administered 2014-12-03: 2 mg via INTRAVENOUS

## 2014-12-03 MED ORDER — PROPOFOL 1000 MG/100ML IV EMUL
0.0000 ug/kg/min | INTRAVENOUS | Status: DC
  Administered 2014-12-03: 5 ug/kg/min via INTRAVENOUS
  Administered 2014-12-04: 30 ug/kg/min via INTRAVENOUS
  Administered 2014-12-05: 40 ug/kg/min via INTRAVENOUS
  Filled 2014-12-03 (×3): qty 100

## 2014-12-03 MED ORDER — ETOMIDATE 2 MG/ML IV SOLN
20.0000 mg | Freq: Once | INTRAVENOUS | Status: AC
  Administered 2014-12-03: 20 mg via INTRAVENOUS
  Filled 2014-12-03: qty 10

## 2014-12-03 MED ORDER — ANTISEPTIC ORAL RINSE SOLUTION (CORINZ)
7.0000 mL | Freq: Four times a day (QID) | OROMUCOSAL | Status: DC
  Administered 2014-12-04 – 2014-12-10 (×17): 7 mL via OROMUCOSAL

## 2014-12-03 MED ORDER — FENTANYL CITRATE (PF) 100 MCG/2ML IJ SOLN
INTRAMUSCULAR | Status: AC
  Filled 2014-12-03: qty 4

## 2014-12-03 MED ORDER — DEXTROSE 50 % IV SOLN
INTRAVENOUS | Status: AC
  Filled 2014-12-03: qty 50

## 2014-12-03 MED ORDER — SODIUM CHLORIDE 0.9 % IV BOLUS (SEPSIS)
500.0000 mL | Freq: Once | INTRAVENOUS | Status: AC
  Administered 2014-12-03: 500 mL via INTRAVENOUS

## 2014-12-03 MED ORDER — ATORVASTATIN CALCIUM 10 MG PO TABS
20.0000 mg | ORAL_TABLET | Freq: Every day | ORAL | Status: DC
  Administered 2014-12-03 – 2014-12-07 (×5): 20 mg
  Filled 2014-12-03 (×5): qty 2

## 2014-12-03 MED ORDER — HALOPERIDOL LACTATE 5 MG/ML IJ SOLN
10.0000 mg | Freq: Once | INTRAMUSCULAR | Status: AC
  Administered 2014-12-03: 5 mg via INTRAVENOUS
  Filled 2014-12-03: qty 2

## 2014-12-03 MED ORDER — LIDOCAINE HCL (CARDIAC) 20 MG/ML IV SOLN
INTRAVENOUS | Status: AC
Start: 2014-12-03 — End: 2014-12-04
  Filled 2014-12-03: qty 5

## 2014-12-03 MED ORDER — SODIUM CHLORIDE 0.9 % IV BOLUS (SEPSIS)
750.0000 mL | Freq: Once | INTRAVENOUS | Status: AC
  Administered 2014-12-03: 750 mL via INTRAVENOUS

## 2014-12-03 MED ORDER — ASPIRIN 81 MG PO CHEW
81.0000 mg | CHEWABLE_TABLET | Freq: Every day | ORAL | Status: DC
  Administered 2014-12-04 – 2014-12-07 (×4): 81 mg
  Filled 2014-12-03 (×4): qty 1

## 2014-12-03 MED ORDER — RISPERIDONE 0.5 MG PO TABS
0.5000 mg | ORAL_TABLET | Freq: Two times a day (BID) | ORAL | Status: DC
  Administered 2014-12-03: 0.5 mg via ORAL
  Filled 2014-12-03 (×2): qty 1

## 2014-12-03 MED ORDER — IPRATROPIUM BROMIDE 0.02 % IN SOLN
0.5000 mg | Freq: Four times a day (QID) | RESPIRATORY_TRACT | Status: DC
  Administered 2014-12-03 – 2014-12-04 (×3): 0.5 mg via RESPIRATORY_TRACT
  Filled 2014-12-03 (×3): qty 2.5

## 2014-12-03 MED ORDER — SUCCINYLCHOLINE CHLORIDE 20 MG/ML IJ SOLN
INTRAMUSCULAR | Status: AC
  Filled 2014-12-03: qty 1

## 2014-12-03 MED ORDER — ROCURONIUM BROMIDE 50 MG/5ML IV SOLN
50.0000 mg | Freq: Once | INTRAVENOUS | Status: AC
  Administered 2014-12-03: 25 mg via INTRAVENOUS
  Filled 2014-12-03: qty 5

## 2014-12-03 MED ORDER — RISPERIDONE 0.5 MG PO TABS
0.5000 mg | ORAL_TABLET | Freq: Two times a day (BID) | ORAL | Status: DC
  Administered 2014-12-03 – 2014-12-05 (×5): 0.5 mg
  Filled 2014-12-03 (×7): qty 1

## 2014-12-03 MED ORDER — SODIUM CHLORIDE 0.9 % IV SOLN
25.0000 ug/h | INTRAVENOUS | Status: DC
  Administered 2014-12-03: 50 ug/h via INTRAVENOUS
  Administered 2014-12-03: 75 ug/h via INTRAVENOUS
  Filled 2014-12-03 (×3): qty 50

## 2014-12-03 MED ORDER — LORAZEPAM 2 MG/ML IJ SOLN
2.0000 mg | Freq: Once | INTRAMUSCULAR | Status: AC
  Administered 2014-12-03: 2 mg via INTRAVENOUS

## 2014-12-03 MED ORDER — MIDAZOLAM HCL 2 MG/2ML IJ SOLN
INTRAMUSCULAR | Status: AC
  Filled 2014-12-03: qty 4

## 2014-12-03 MED ORDER — DEXTROSE 50 % IV SOLN
50.0000 mL | Freq: Once | INTRAVENOUS | Status: AC
  Administered 2014-12-03: 50 mL via INTRAVENOUS

## 2014-12-03 MED ORDER — INSULIN ASPART 100 UNIT/ML ~~LOC~~ SOLN
0.0000 [IU] | SUBCUTANEOUS | Status: DC
  Administered 2014-12-03 – 2014-12-05 (×5): 3 [IU] via SUBCUTANEOUS
  Administered 2014-12-05: 4 [IU] via SUBCUTANEOUS
  Administered 2014-12-05: 5 [IU] via SUBCUTANEOUS
  Administered 2014-12-06: 3 [IU] via SUBCUTANEOUS
  Administered 2014-12-06: 4 [IU] via SUBCUTANEOUS

## 2014-12-03 MED ORDER — ANTISEPTIC ORAL RINSE SOLUTION (CORINZ)
7.0000 mL | Freq: Four times a day (QID) | OROMUCOSAL | Status: DC
  Administered 2014-12-03: 7 mL via OROMUCOSAL

## 2014-12-03 MED ORDER — ALBUTEROL SULFATE (2.5 MG/3ML) 0.083% IN NEBU
2.5000 mg | INHALATION_SOLUTION | RESPIRATORY_TRACT | Status: DC
  Administered 2014-12-03 – 2014-12-04 (×6): 2.5 mg via RESPIRATORY_TRACT
  Filled 2014-12-03 (×6): qty 3

## 2014-12-03 MED ORDER — FENTANYL CITRATE (PF) 100 MCG/2ML IJ SOLN
50.0000 ug | Freq: Once | INTRAMUSCULAR | Status: AC
  Administered 2014-12-03: 50 ug via INTRAVENOUS

## 2014-12-03 NOTE — Progress Notes (Signed)
Critical ABG reported to Dr. Kendrick FriesMcQuaid with no new vent orders at this time, collect am ABG.

## 2014-12-03 NOTE — Progress Notes (Signed)
Attempted to remove pt from Bipap and place on [place] at this time per pt preference.  Pt tolerated poorly, becoming labored and SOB, returned to Bipap on previous settings.  Pt Sp02 100%, will continue to monitor.

## 2014-12-03 NOTE — Progress Notes (Signed)
PULMONARY / CRITICAL CARE MEDICINE   Name: Nicole Maxwell MRN: [id number] DOB: Sep 06, 1945    ADMISSION DATE:  11/26/2014 CONSULTATION DATE:  11/29/2014 REFERRING MD :  Sharl MaLama  CHIEF COMPLAINT:  Short of breath  INITIAL PRESENTATION:  69 yo female smoker admitted with dyspnea, cough, wheeze from AECOPD and acute on chronic diastolic dysfx.  Followed by Dr. Marchelle Gearingamaswamy as outpt for GOLD D COPD (FEV1 0.5/29%, DLCO 27% from 04/13/10)  STUDIES:   SIGNIFICANT EVENTS: 11/01 Admit 11/05 altered mental status 11/06 BiPAP, precedex  SUBJECTIVE:  Agitated by pm requiring higher dose  precedex & haldol  Afebrile Remained off bipap Cough + but unable to expectorate  VITAL SIGNS: Temp:  [97 F (36.1 C)-99.4 F (37.4 C)] 97 F (36.1 C) (11/08 0800) Pulse Rate:  [66-80] 66 (11/08 0800) Resp:  [16-25] 16 (11/08 0800) BP: (120-167)/(62-97) 163/68 mmHg (11/08 0800) SpO2:  [92 %-100 %] 98 % (11/08 0824) FiO2 (%):  [30 %] 30 % (11/08 0400) VENTILATOR SETTINGS: Vent Mode:  [-]  FiO2 (%):  [30 %] 30 % INTAKE / OUTPUT:  Intake/Output Summary (Last 24 hours) at 12/03/14 0947 Last data filed at 12/03/14 0900  Gross per 24 hour  Intake 863.29 ml  Output   1830 ml  Net -966.71 ml    PHYSICAL EXAMINATION: General:  Thin, chr ill appearing Neuro:  Agitated at times, follows commands HEENT:  Dry mouth with FF mask Cardiovascular:  regular Lungs:  B/l rhonchi, poor air movement, faint exp wheeze Abdomen:  Soft, non tender Musculoskeletal:  No edema, decreased muscle bulk Skin:  No rashes  LABS:  CBC  Recent Labs Lab 12/01/14 0354 12/02/14 0334 12/03/14 0350  WBC 29.0* 18.9* 16.7*  HGB 12.0 10.9* 11.8*  HCT 41.9 37.2 39.3  PLT 229 217 242   Coag's  Recent Labs Lab 11/26/14 1542 12/01/14 0107  APTT 26  --   INR 0.93 0.91   BMET  Recent Labs Lab 12/01/14 0354 12/02/14 0334 12/03/14 0350  NA 146* 141 140  K 4.3 4.5 4.3  CL 94* 91* 93*  CO2 46* 44* 38*  BUN 34* 40* 28*   CREATININE 0.79 0.70 0.62  GLUCOSE 90 166* 171*   Electrolytes  Recent Labs Lab 11/28/14 0350  12/01/14 0354 12/02/14 0334 12/03/14 0350  CALCIUM 7.8*  < > 8.6* 8.4* 8.8*  MG 1.3*  --   --   --   --   < > = values in this interval not displayed.   Sepsis Markers  Recent Labs Lab 11/26/14 1014 11/26/14 1542 11/26/14 1830  LATICACIDVEN 0.66 1.3 1.7  PROCALCITON  --  <0.10  --    ABG  Recent Labs Lab 11/30/14 1428 12/01/14 0800 12/02/14 1847  PHART 7.219* 7.299* 7.457*  PCO2ART 113* 93.7* 55.1*  PO2ART 81.1 65.9* 76.0*   Liver Enzymes  Recent Labs Lab 11/27/14 0340 12/01/14 0107  AST 17 19  ALT 18 24  ALKPHOS 49 53  BILITOT 0.6 0.6  ALBUMIN 3.1* 3.5   Glucose  Recent Labs Lab 12/01/14 2247 12/02/14 0836 12/02/14 1201 12/02/14 1638 12/02/14 2106 12/03/14 0739  GLUCAP 122* 186* 131* 111* 160* 146*    Imaging No results found.   ASSESSMENT / PLAN:  PULMONARY A: Acute on chronic hypoxic/hypercapnic respiratory failure 2nd to AECOPD. Tobacco abuse. P:   Adjust oxygen to keep SpO2 89 to 95% BiPAP as tolerated , ABG only if mental status poor Scheduled pulmicort, albuterol, ipratropium Smoking cessation  CARDIOVASCULAR A:  Acute on chronic diastolic dysfunction. Hx of HTN, HLD. P:  Continue ASA, lipitor, lasix, avapro  RENAL A:   Compensatory metabolic alkalosis. P:   F/u BMET  GASTROINTESTINAL A:   Protein calorie malnutrition. P:   Clear liquid diet until respiratory status more stable Protonix for SUP  HEMATOLOGIC A:   Leukocytosis. P:  F/u CBC Lovenox for DVT prevention  INFECTIOUS A:   AECOPD. P:   Day 7 Abx, dc   ENDOCRINE A:   DM with steroid induced hyperglycemia.   P:   SSI  NEUROLOGIC A:   Acute encephalopathy 2nd to respiratory failure, now complicated by ICU delerium - worse in pm Hx of chronic back pain, ETOH (in remission), Anxiety. P:   Ct  precedex since  11/06 RASS goal: 0 PRN  fentanyl Hold outpt xanax ('seldom takes'), norco Add risperdal 0.5 bid   Updated daughter at bedside.11/7  Summary - Remains bipap dependent - high risk intubation   The patient is critically ill with multiple organ systems failure and requires high complexity decision making for assessment and support, frequent evaluation and titration of therapies, application of advanced monitoring technologies and extensive interpretation of multiple databases. Critical Care Time devoted to patient care services described in this note independent of APP time is 31 minutes.    Cyril Mourning MD. Tonny Bollman. [facility] Pager [phone] If no response call [phone]      12/03/2014, 9:47 AM

## 2014-12-03 NOTE — Progress Notes (Signed)
RT assisted NP with PT intubation. Uneventful. Resulted in 7.5 ETT at 22cm lip. Positive EndTidal, bilateral breath sounds, chest rise, CXR pending.

## 2014-12-03 NOTE — Progress Notes (Signed)
eLink Physician-Brief Progress Note Patient Name: Earlyne Ibalta S Enrique DOB: 1945/09/19 MRN: [id number]   Date of Service  12/03/2014  HPI/Events of Note  Hypercarbia post intubation Severe COPD PaO2 increased  eICU Interventions  Just decrease FiO2 Repeat ABG in AM No adjustment to minute ventilation given severe airflow obstruction     Intervention Category Major Interventions: Respiratory failure - evaluation and management  Max FickleDouglas Izick Gasbarro 12/03/2014, 5:41 PM

## 2014-12-03 NOTE — Progress Notes (Signed)
PCCM Attending Note:  Called to bedside by NP.  Patient with hypercarbic respiratory failure. Failing BiPAP. Patient intubated at bedside by Zenia ResidesPeter Babcock, NP. I was present during the intubation. Stabilizing on ventilator protocol. Plan for sedation with propofol. Continuing further treatment & nebulizer therapies. She'll get an empty swallow respiratory viral panelto check tracheal aspirate culture.  Donna ChristenJennings E. Jamison NeighborNestor, M.D. [facility] Pager:  [phone] After 3pm or if no response, call [phone]

## 2014-12-03 NOTE — Progress Notes (Signed)
eLink Physician-Brief Progress Note Patient Name: Nicole Maxwell DOB: 1945/09/19 MRN: [id number]   Date of Service  12/03/2014  HPI/Events of Note  Continued agitation despite precedex and periodic dosing of haldol.  Last dosing around 6pm for 10 mg.  eICU Interventions  Plan: One time dose of 10 mg haldol IV     Intervention Category Major Interventions: Delirium, psychosis, severe agitation - evaluation and management  DETERDING,ELIZABETH 12/03/2014, 3:40 AM

## 2014-12-03 NOTE — Progress Notes (Signed)
Pt placed on Bipap @ 1015 due to increased WOB, tolerating well.

## 2014-12-03 NOTE — Progress Notes (Signed)
Contacted Dr Vassie LollAlva for patient's worsening Respiratory Status & received order to keep pt on Bipap until 1700 & make NPO.

## 2014-12-03 NOTE — Procedures (Signed)
Intubation Procedure Note Earlyne Ibalta S Raven [id number] [birth year]  Procedure: Intubation Indications: Respiratory insufficiency  Procedure Details Consent: Unable to obtain consent because of emergent medical necessity. Time Out: Verified patient identification, verified procedure, site/side was marked, verified correct patient position, special equipment/implants available, medications/allergies/relevent history reviewed, required imaging and test results available.  Performed  Maximum sterile technique was used including antiseptics, cap, gloves, gown, hand hygiene and mask.  MAC and 3    Evaluation Hemodynamic Status: BP stable throughout; O2 sats: stable throughout Patient's Current Condition: stable Complications: No apparent complications Patient did tolerate procedure well. Chest X-ray ordered to verify placement.  CXR: pending.   Shelby Mattocksete E Takuma Cifelli 12/03/2014

## 2014-12-03 NOTE — Progress Notes (Signed)
Patient's BP drop MAP in 40's. Sedation decreased by 50% with no change. CCM notified. 500cc bolus of NS given. BP and MAP improving. Will continue to monitor. MGoin, Charity fundraiserN.

## 2014-12-03 NOTE — Progress Notes (Signed)
eLink Physician-Brief Progress Note Patient Name: Nicole Maxwell DOB: 05-26-1945 MRN: [id number]   Date of Service  12/03/2014  HPI/Events of Note  Intubated, pulling at tubes, lines  eICU Interventions  Restraints temporarily Use ordered sedation for desired RASS then d/c restraints     Intervention Category Minor Interventions: Routine modifications to care plan (e.g. PRN medications for pain, fever)  Max FickleDouglas McQuaid 12/03/2014, 4:12 PM

## 2014-12-03 NOTE — Progress Notes (Signed)
Noticed patient had what appeared to be two runs of V tach on monitor in a time frame of about 1 minute. First run was 8-9 beats and the second was about 17 beat run. CCM was notified, no further orders at this time. Will continue to monitor. MGoin, RN

## 2014-12-03 NOTE — Progress Notes (Signed)
eLink Physician-Brief Progress Note Patient Name: Nicole Maxwell DOB: 10-Dec-1945 MRN: [id number]   Date of Service  12/03/2014  HPI/Events of Note  hypotensive  eICU Interventions  Bolus 500cc saline     Intervention Category Major Interventions: Hypotension - evaluation and management  Max FickleDouglas McQuaid 12/03/2014, 10:55 PM

## 2014-12-03 NOTE — Progress Notes (Signed)
    Interval: Pt has been on NIPPV since am hours. Progressive increase in work of breathing, agitation and respiratory failure in spite of NIPPV and escalating precedex infusion.   Objective: BP 142/127 mmHg  Pulse 95  Temp(Src) 96.7 F (35.9 C) (Axillary)  Resp 26  Ht 5\' 4"  (1.626 m)  Wt 51.3 kg (113 lb 1.5 oz)  BMI 19.40 kg/m2  SpO2 100%   Intake/Output Summary (Last 24 hours) at 12/03/14 1524 Last data filed at 12/03/14 1200  Gross per 24 hour  Intake 855.74 ml  Output   1531 ml  Net -675.26 ml    General- Now orally intubated, still w/ some accessory muscle use. HENT: NCAT.  Pilm: dyssynchronous, prolonged exp wheeze w/ scattered rhonchi. Card: rrr, no MRG. Abd- + bowel sounds, no OM, EXT: no edema, brisk CR. Neuro: now sedated.    Recent Labs Lab 12/01/14 0354 12/02/14 0334 12/03/14 0350  NA 146* 141 140  K 4.3 4.5 4.3  CL 94* 91* 93*  CO2 46* 44* 38*  BUN 34* 40* 28*  CREATININE 0.79 0.70 0.62  GLUCOSE 90 166* 171*    Recent Labs Lab 12/01/14 0354 12/02/14 0334 12/03/14 0350  HGB 12.0 10.9* 11.8*  HCT 41.9 37.2 39.3  WBC 29.0* 18.9* 16.7*  PLT 229 217 242   ABG    Component Value Date/Time   PHART 7.459* 12/03/2014 1500   PCO2ART 52.8* 12/03/2014 1500   PO2ART 69.5* 12/03/2014 1500   HCO3 36.9* 12/03/2014 1500   TCO2 33.1 12/03/2014 1500   O2SAT 92.6 12/03/2014 1500   Impression/plan  Acute on Chronic hypoxic and hypercarbic respiratory failure in setting of AECOPD. Failed NIPPV. Now intubated.  -exam w/ sig bronchospasm Plan Full vent support Avoid air trapping PAD protocol w/ RASS for -3 (fentantyl and diprivan) Cont current abx Send sputum and RVP Escalate NIPPV  Post-intubation hypotension -improved w/ time.  Plan May need pressors if sedation related hypotension   Family updated.   60 minutes CCM time by myself at the bedside.    Simonne MartinetPeter E Lorren Rossetti ACNP-BC [facility] Pager # [phone] OR # [phone] if  no answer

## 2014-12-04 ENCOUNTER — Inpatient Hospital Stay (HOSPITAL_COMMUNITY): Payer: Medicare Other

## 2014-12-04 LAB — BASIC METABOLIC PANEL
Anion gap: 11 (ref 5–15)
BUN: 39 mg/dL — AB (ref 6–20)
CHLORIDE: 99 mmol/L — AB (ref 101–111)
CO2: 32 mmol/L (ref 22–32)
CREATININE: 1 mg/dL (ref 0.44–1.00)
Calcium: 8.6 mg/dL — ABNORMAL LOW (ref 8.9–10.3)
GFR calc Af Amer: >60 mL/min (ref >60)
GFR calc non Af Amer: 56 mL/min — ABNORMAL LOW (ref >60)
GLUCOSE: 124 mg/dL — AB (ref 65–99)
POTASSIUM: 3.8 mmol/L (ref 3.5–5.1)
SODIUM: 142 mmol/L (ref 135–145)

## 2014-12-04 LAB — BLOOD GAS, ARTERIAL
Acid-Base Excess: 6.9 mmol/L — ABNORMAL HIGH (ref 0.0–2.0)
Bicarbonate: 30.6 mEq/L — ABNORMAL HIGH (ref 20.0–24.0)
DRAWN BY: 235321
FIO2: 0.3
LHR: 16 {breaths}/min
MECHVT: 430 mL
O2 Saturation: 83.4 %
PATIENT TEMPERATURE: 98.7
PCO2 ART: 40.9 mmHg (ref 35.0–45.0)
PEEP: 5 cmH2O
PO2 ART: 50.1 mmHg — AB (ref 80.0–100.0)
TCO2: 27.7 mmol/L (ref 0–100)
pH, Arterial: 7.487 — ABNORMAL HIGH (ref 7.350–7.450)

## 2014-12-04 LAB — GLUCOSE, CAPILLARY
GLUCOSE-CAPILLARY: 112 mg/dL — AB (ref 65–99)
GLUCOSE-CAPILLARY: 122 mg/dL — AB (ref 65–99)
GLUCOSE-CAPILLARY: 85 mg/dL (ref 65–99)
GLUCOSE-CAPILLARY: 93 mg/dL (ref 65–99)
Glucose-Capillary: 125 mg/dL — ABNORMAL HIGH (ref 65–99)

## 2014-12-04 LAB — MAGNESIUM: Magnesium: 1.8 mg/dL (ref 1.7–2.4)

## 2014-12-04 LAB — PHOSPHORUS: Phosphorus: 2.9 mg/dL (ref 2.5–4.6)

## 2014-12-04 MED ORDER — VITAL HIGH PROTEIN PO LIQD
1000.0000 mL | ORAL | Status: DC
  Filled 2014-12-04: qty 1000

## 2014-12-04 MED ORDER — IPRATROPIUM-ALBUTEROL 0.5-2.5 (3) MG/3ML IN SOLN
3.0000 mL | Freq: Four times a day (QID) | RESPIRATORY_TRACT | Status: DC
  Administered 2014-12-04 – 2014-12-12 (×32): 3 mL via RESPIRATORY_TRACT
  Filled 2014-12-04 (×33): qty 3

## 2014-12-04 MED ORDER — VITAL HIGH PROTEIN PO LIQD
1000.0000 mL | ORAL | Status: DC
  Administered 2014-12-04 – 2014-12-05 (×3): 1000 mL
  Filled 2014-12-04: qty 1000

## 2014-12-04 MED ORDER — PANTOPRAZOLE SODIUM 40 MG PO PACK
40.0000 mg | PACK | Freq: Every day | ORAL | Status: DC
  Administered 2014-12-04 – 2014-12-05 (×2): 40 mg
  Filled 2014-12-04 (×3): qty 20

## 2014-12-04 NOTE — Progress Notes (Signed)
Patient was returned to full support on ventilator. Patient HR and BP were elevated post increase in PS per Dr. Vassie LollAlva. Patient was not comfortable with resulting tachycardia and HTN. After patient was returned to full support, she is more comfortable but still tachycardic. RT will continue to monitor patient. RN aware of changes.  \

## 2014-12-04 NOTE — Progress Notes (Signed)
Patients  HR increased to 130 s and BP increased to 187/71. Paged Dr. Vassie LollAlva at [phone], and notified him at 1231. He requested respiratory therapy to increase pressure support. Will continue to monitor.

## 2014-12-04 NOTE — Progress Notes (Signed)
Non-violent bilateral wrist restraint orders assessed at change of shift 0730 Wednesday 12/04/2014 and noted that order was placed at 1612 on Tuesday 12/03/2014. Order states that it must be renewed every calendar day or discontinued. Order has not been discontinued, patient remains confused and is pulling at lines and tubes. Alerted day shift nurse that order will need to be renewed when CCM rounds on unit this morning to be effective for the next calendar day. Bosie HelperS. Suzetta Timko, RN

## 2014-12-04 NOTE — Progress Notes (Signed)
Nutrition Follow-up  DOCUMENTATION CODES:   Non-severe (moderate) malnutrition in context of chronic illness  INTERVENTION:   Initiate Vital HP @ 20 ml/hr via OGT and increase by 10 ml every 4 hours to goal rate of 45 ml/hr.   Tube feeding regimen + current Propofol infusion provides 1162 kcal (98% of needs), 95 grams of protein, and 903 ml of H2O.   RD to continue to monitor  NUTRITION DIAGNOSIS:   Malnutrition related to chronic illness as evidenced by moderate depletion of body fat, severe depletion of muscle mass.  Ongoing.  GOAL:   Patient will meet greater than or equal to 90% of their needs  Not meeting.  MONITOR:   Vent status, Labs, Weight trends, Skin, TF tolerance, I & O's  ASSESSMENT:   69 y.o. female with PMH COPD oxygen dependant on chronic steroids with ongoing smoking that is brough to the ED by EMS, complaining of worsening SOB for 4 days progressively getting worst.   Pt intubated 11/8, patient has not had solid food since 11/02 when she was eating 50-90% of meals.  Patient currently confused with wrist restraints.  Patient is currently intubated on ventilator support MV: 6.1 L/min Temp (24hrs), Avg:97.9 F (36.6 C), Min:96.7 F (35.9 C), Max:99.1 F (37.3 C)  Propofol: 3.1 ml/hr -> provides 82 fat kcal  Labs reviewed: Elevated BUN Mg/Phos WNL  Diet Order:  Diet NPO time specified  Skin:  Reviewed, no issues  Last BM:  11/8  Height:   Ht Readings from Last 1 Encounters:  11/30/14 5\' 4"  (1.626 m)    Weight:   Wt Readings from Last 1 Encounters:  12/02/14 113 lb 1.5 oz (51.3 kg)    Ideal Body Weight:  54.5 kg  BMI:  Body mass index is 19.4 kg/(m^2).  Estimated Nutritional Needs:   Kcal:  1189  Protein:  80-90g  Fluid:  1.5L/day  EDUCATION NEEDS:   No education needs identified at this time  Tilda FrancoLindsey Kleinpeter, MS, RD, LDN Pager: [phone] After Hours Pager: [phone]

## 2014-12-04 NOTE — Progress Notes (Signed)
PULMONARY / CRITICAL CARE MEDICINE   Name: Nicole Maxwell MRN: [id number] DOB: [birth year]    ADMISSION DATE:  11/26/2014 CONSULTATION DATE:  11/29/2014 REFERRING MD :  [name]  CHIEF COMPLAINT:  Short of breath  INITIAL PRESENTATION:  69 yo female smoker admitted with dyspnea, cough, wheeze from AECOPD and acute on chronic diastolic dysfx.  Followed by Dr. Marchelle Gearing as outpt for GOLD D COPD (FEV1 0.5/29%, DLCO 27% from 04/13/10)  STUDIES:   SIGNIFICANT EVENTS: 11/01 Admit 11/05 altered mental status 11/06 BiPAP, precedex 11/7 Agitated by pm requiring higher dose  precedex & haldol  11/8 intubated for resp distress  SUBJECTIVE:  Intubated , sedated , on restraints , on propofol + fent gtt Afebrile   VITAL SIGNS: Temp:  [96.7 F (35.9 C)-99.1 F (37.3 C)] 99.1 F (37.3 C) (11/09 0800) Pulse Rate:  [56-125] 116 (11/09 0800) Resp:  [13-26] 16 (11/09 0800) BP: (62-166)/(32-127) 159/62 mmHg (11/09 0800) SpO2:  [96 %-100 %] 100 % (11/09 0800) FiO2 (%):  [25 %-100 %] 30 % (11/09 1137) VENTILATOR SETTINGS: Vent Mode:  [-] PSV FiO2 (%):  [25 %-100 %] 30 % Set Rate:  [16 bmp] 16 bmp Vt Set:  [430 mL] 430 mL PEEP:  [5 cmH20] 5 cmH20 Pressure Support:  [8 cmH20] 8 cmH20 Plateau Pressure:  [13 cmH20-30 cmH20] 13 cmH20 INTAKE / OUTPUT:  Intake/Output Summary (Last 24 hours) at 12/04/14 1159 Last data filed at 12/04/14 1106  Gross per 24 hour  Intake 1782.23 ml  Output    396 ml  Net 1386.23 ml    PHYSICAL EXAMINATION: General:  Thin, chr ill appearing Neuro:  Agitated at times, follows commands HEENT:  Dry mouth with FF mask Cardiovascular:  regular Lungs:  B/l rhonchi, poor air movement, faint exp wheeze Abdomen:  Soft, non tender Musculoskeletal:  No edema, decreased muscle bulk Skin:  No rashes  LABS:  CBC  Recent Labs Lab 12/01/14 0354 12/02/14 0334 12/03/14 0350  WBC 29.0* 18.9* 16.7*  HGB 12.0 10.9* 11.8*  HCT 41.9 37.2 39.3  PLT 229 217 242    Coag's  Recent Labs Lab 12/01/14 0107  INR 0.91   BMET  Recent Labs Lab 12/02/14 0334 12/03/14 0350 12/04/14 0345  NA 141 140 142  K 4.5 4.3 3.8  CL 91* 93* 99*  CO2 44* 38* 32  BUN 40* 28* 39*  CREATININE 0.70 0.62 1.00  GLUCOSE 166* 171* 124*   Electrolytes  Recent Labs Lab 11/28/14 0350  12/02/14 0334 12/03/14 0350 12/04/14 0345  CALCIUM 7.8*  < > 8.4* 8.8* 8.6*  MG 1.3*  --   --   --  1.8  PHOS  --   --   --   --  2.9  < > = values in this interval not displayed.   Sepsis Markers No results for input(s): LATICACIDVEN, PROCALCITON, O2SATVEN in the last 168 hours. ABG  Recent Labs Lab 12/03/14 1459 12/03/14 1500 12/04/14 0409  PHART 7.296* 7.459* 7.487*  PCO2ART 70.3* 52.8* 40.9  PO2ART 420* 69.5* 50.1*   Liver Enzymes  Recent Labs Lab 12/01/14 0107  AST 19  ALT 24  ALKPHOS 53  BILITOT 0.6  ALBUMIN 3.5   Glucose  Recent Labs Lab 12/03/14 1550 12/03/14 1617 12/03/14 1927 12/03/14 2358 12/04/14 0411 12/04/14 0723  GLUCAP 68 179* 135* 85 122* 93    Imaging Dg Abd 1 View  12/03/2014  CLINICAL DATA:  Assess orogastric tube placement. EXAM: ABDOMEN - 1 VIEW COMPARISON:  Earlier same day and 12/01/2014. FINDINGS: Enteric tube has been advanced as the tip and side port are present over the left mid abdomen likely over the distal gastric body. Bowel gas pattern is nonobstructive with mild fecal retention over the right colon. Remainder of the exam is unchanged. IMPRESSION: Nonobstructive bowel gas pattern. Interval advancement of the enteric tube with tip and side-port over the the expected region of the distal gastric body in the left mid abdomen. Electronically Signed   By: Elberta Fortisaniel  Boyle M.D.   On: 12/03/2014 18:24   Dg Abd 1 View  12/03/2014  CLINICAL DATA:  orogastric tube placement EXAM: ABDOMEN - 1 VIEW COMPARISON:  December 01, 2014 FINDINGS: Orogastric tube tip is in the gastric cardia with the side port above the gastroesophageal  junction. There is generalized bowel dilatation in a pattern suggesting ileus. There is fairly diffuse stool throughout the colon. No free air is seen on this limited examination. There is postoperative change in the lower lumbar spine. IMPRESSION: Orogastric tube tip in gastric cardia with side port above the gastroesophageal junction. Advise advancing nasogastric tube approximately 10 cm to insure placement of tube tip and side port below the diaphragm. Suspect a degree of underlying ileus. These results will be called to the ordering clinician or representative by the Radiologist Assistant, and communication documented in the PACS or zVision Dashboard. Electronically Signed   By: Bretta BangWilliam  Woodruff III M.D.   On: 12/03/2014 16:07   Dg Chest Port 1 View  12/03/2014  CLINICAL DATA:  [age] year old female with acute respiratory failure, intubated. Initial encounter. EXAM: PORTABLE CHEST 1 VIEW COMPARISON:  12/02/2014 and earlier. FINDINGS: Portable AP semi upright view at 1527 hours. Endotracheal tube tip projects between the level the clavicles and carina in good position. An enteric tube is also in place. The tip projects at or just below the level of the carina. Large lung volumes are stable. Stable cardiac size and mediastinal contours. Allowing for portable technique, the lungs are clear. Mildly increased gaseous distension of the stomach. IMPRESSION: 1. Endotracheal tube tip in good position between the clavicles and carina. 2. Enteric tube tip projects at the mid mediastinum near the carina. 3.  No acute cardiopulmonary abnormality. Electronically Signed   By: Odessa FlemingH  Hall M.D.   On: 12/03/2014 16:05     ASSESSMENT / PLAN:  PULMONARY A: Acute on chronic hypoxic/hypercapnic respiratory failure 2nd to AECOPD. Tobacco abuse. P:   Adjust oxygen to keep SpO2 89 to 95% Start SBTs Scheduled pulmicort, albuterol, ipratropium Smoking cessation  CARDIOVASCULAR A:  Acute on chronic diastolic dysfunction. Hx  of HTN, HLD. P:  Continue ASA, lipitor, lasix,  Hold avapro  RENAL A:   Compensatory metabolic alkalosis. P:   F/u BMET  GASTROINTESTINAL A:   Protein calorie malnutrition. P:   Start TFs Protonix for SUP  HEMATOLOGIC A:   Leukocytosis. P:  Lovenox for DVT prevention  INFECTIOUS A:   AECOPD - got levaquin x 7 ds P:   Off abx   ENDOCRINE A:   DM with steroid induced hyperglycemia.   P:   SSI  NEUROLOGIC A:   Acute encephalopathy 2nd to respiratory failure, now complicated by ICU delerium - worse in pm Hx of chronic back pain, ETOH (in remission), Anxiety. P:   RASS goal: 0 Propofol + fentanyl gtt Hold outpt xanax ('seldom takes'), norco Added risperdal 0.5 bid 11/8 for delerium  Updated daughter 11/8  Summary - Unfortunately required intubation, prognosis guarded, high risk for  vent dependence   The patient is critically ill with multiple organ systems failure and requires high complexity decision making for assessment and support, frequent evaluation and titration of therapies, application of advanced monitoring technologies and extensive interpretation of multiple databases. Critical Care Time devoted to patient care services described in this note independent of APP time is 34 minutes.    Cyril Mourning MD. Tonny Bollman.  Pulmonary & Critical care Pager [phone] If no response call [phone]      12/04/2014, 11:59 AM

## 2014-12-05 ENCOUNTER — Inpatient Hospital Stay (HOSPITAL_COMMUNITY): Payer: Medicare Other

## 2014-12-05 LAB — CBC
HCT: 35.6 % — ABNORMAL LOW (ref 36.0–46.0)
HEMOGLOBIN: 10.8 g/dL — AB (ref 12.0–15.0)
MCH: 26.1 pg (ref 26.0–34.0)
MCHC: 30.3 g/dL (ref 30.0–36.0)
MCV: 86 fL (ref 78.0–100.0)
PLATELETS: 218 10*3/uL (ref 150–400)
RBC: 4.14 MIL/uL (ref 3.87–5.11)
RDW: 14.4 % (ref 11.5–15.5)
WBC: 21.9 10*3/uL — ABNORMAL HIGH (ref 4.0–10.5)

## 2014-12-05 LAB — BLOOD GAS, ARTERIAL
Acid-Base Excess: 6.3 mmol/L — ABNORMAL HIGH (ref 0.0–2.0)
BICARBONATE: 32.1 meq/L — AB (ref 20.0–24.0)
Drawn by: 441261
FIO2: 0.3
O2 SAT: 91.3 %
PATIENT TEMPERATURE: 98.6
PCO2 ART: 54.7 mmHg — AB (ref 35.0–45.0)
PEEP/CPAP: 5 cmH2O
PO2 ART: 70.8 mmHg — AB (ref 80.0–100.0)
PRESSURE SUPPORT: 5 cmH2O
TCO2: 29.5 mmol/L (ref 0–100)
pH, Arterial: 7.386 (ref 7.350–7.450)

## 2014-12-05 LAB — BASIC METABOLIC PANEL
Anion gap: 11 (ref 5–15)
BUN: 55 mg/dL — ABNORMAL HIGH (ref 6–20)
CALCIUM: 8.8 mg/dL — AB (ref 8.9–10.3)
CHLORIDE: 99 mmol/L — AB (ref 101–111)
CO2: 35 mmol/L — AB (ref 22–32)
CREATININE: 1.21 mg/dL — AB (ref 0.44–1.00)
GFR calc Af Amer: 51 mL/min — ABNORMAL LOW (ref >60)
GFR calc non Af Amer: 44 mL/min — ABNORMAL LOW (ref >60)
GLUCOSE: 109 mg/dL — AB (ref 65–99)
Potassium: 3.5 mmol/L (ref 3.5–5.1)
Sodium: 145 mmol/L (ref 135–145)

## 2014-12-05 LAB — RESPIRATORY VIRUS PANEL
ADENOVIRUS: NEGATIVE
INFLUENZA A: NEGATIVE
INFLUENZA B 1: NEGATIVE
Metapneumovirus: NEGATIVE
Parainfluenza 1: NEGATIVE
Parainfluenza 2: NEGATIVE
Parainfluenza 3: NEGATIVE
RESPIRATORY SYNCYTIAL VIRUS A: NEGATIVE
RESPIRATORY SYNCYTIAL VIRUS B: NEGATIVE
RHINOVIRUS: POSITIVE — AB

## 2014-12-05 LAB — GLUCOSE, CAPILLARY
GLUCOSE-CAPILLARY: 129 mg/dL — AB (ref 65–99)
GLUCOSE-CAPILLARY: 152 mg/dL — AB (ref 65–99)
GLUCOSE-CAPILLARY: 93 mg/dL (ref 65–99)
Glucose-Capillary: 102 mg/dL — ABNORMAL HIGH (ref 65–99)
Glucose-Capillary: 131 mg/dL — ABNORMAL HIGH (ref 65–99)
Glucose-Capillary: 184 mg/dL — ABNORMAL HIGH (ref 65–99)
Glucose-Capillary: 151 mg/dL — ABNORMAL HIGH (ref 65–99)
Glucose-Capillary: 92 mg/dL (ref 65–99)

## 2014-12-05 MED ORDER — FUROSEMIDE 10 MG/ML IJ SOLN
40.0000 mg | Freq: Once | INTRAMUSCULAR | Status: AC
  Administered 2014-12-05: 40 mg via INTRAVENOUS
  Filled 2014-12-05: qty 4

## 2014-12-05 MED ORDER — PIPERACILLIN-TAZOBACTAM 3.375 G IVPB
3.3750 g | Freq: Three times a day (TID) | INTRAVENOUS | Status: DC
  Administered 2014-12-05 – 2014-12-06 (×3): 3.375 g via INTRAVENOUS
  Filled 2014-12-05 (×3): qty 50

## 2014-12-05 NOTE — Progress Notes (Signed)
PULMONARY / CRITICAL CARE MEDICINE   Name: Nicole Maxwell MRN: [id number] DOB: Oct 24, 1945    ADMISSION DATE:  11/26/2014 CONSULTATION DATE:  11/29/2014 REFERRING MD :  [name]  CHIEF COMPLAINT:  Short of breath  INITIAL PRESENTATION:  69 yo female smoker admitted with dyspnea, cough, wheeze from AECOPD and acute on chronic diastolic dysfx.  Followed by Dr. Marchelle Gearing as outpt for GOLD D COPD (FEV1 0.5/29%, DLCO 27% from 04/13/10)  STUDIES:   SIGNIFICANT EVENTS: 11/01 Admit 11/05 altered mental status 11/06 BiPAP, precedex 11/7 Agitated by pm requiring higher dose  precedex & haldol  11/8 intubated for resp distress 11/9 weaned on PS 8/5 x 4h  SUBJECTIVE:  Intubated , sedated , on restraints ,  on propofol + fent gtt Afebrile Uo dropping   VITAL SIGNS: Temp:  [97.1 F (36.2 C)-98.7 F (37.1 C)] 97.1 F (36.2 C) (11/10 0800) Pulse Rate:  [65-130] 103 (11/10 0738) Resp:  [13-32] 22 (11/10 0738) BP: (75-184)/(35-89) 164/52 mmHg (11/10 0738) SpO2:  [90 %-100 %] 100 % (11/10 0738) FiO2 (%):  [30 %] 30 % (11/10 0738) Weight:  [47.2 kg (104 lb 0.9 oz)-48.4 kg (106 lb 11.2 oz)] 47.2 kg (104 lb 0.9 oz) (11/10 0500) VENTILATOR SETTINGS: Vent Mode:  [-] PRVC FiO2 (%):  [30 %] 30 % Set Rate:  [16 bmp] 16 bmp Vt Set:  [430 mL] 430 mL PEEP:  [5 cmH20] 5 cmH20 Pressure Support:  [8 cmH20-12 cmH20] 12 cmH20 Plateau Pressure:  [13 cmH20-26 cmH20] 15 cmH20 INTAKE / OUTPUT:  Intake/Output Summary (Last 24 hours) at 12/05/14 0829 Last data filed at 12/05/14 0600  Gross per 24 hour  Intake 1257.62 ml  Output    515 ml  Net 742.62 ml    PHYSICAL EXAMINATION: General:  Thin, chr ill appearing Neuro:  Agitated at times, follows commands, non focal HEENT:  Dry mouth with FF mask Cardiovascular:  regular Lungs:  B/l rhonchi, poor air movement, nowheeze Abdomen:  Soft, non tender Musculoskeletal:  No edema, decreased muscle bulk Skin:  No rashes  LABS:  CBC  Recent Labs Lab  12/02/14 0334 12/03/14 0350 12/05/14 0408  WBC 18.9* 16.7* 21.9*  HGB 10.9* 11.8* 10.8*  HCT 37.2 39.3 35.6*  PLT 217 242 218   Coag's  Recent Labs Lab 12/01/14 0107  INR 0.91   BMET  Recent Labs Lab 12/03/14 0350 12/04/14 0345 12/05/14 0408  NA 140 142 145  K 4.3 3.8 3.5  CL 93* 99* 99*  CO2 38* 32 35*  BUN 28* 39* 55*  CREATININE 0.62 1.00 1.21*  GLUCOSE 171* 124* 109*   Electrolytes  Recent Labs Lab 12/03/14 0350 12/04/14 0345 12/05/14 0408  CALCIUM 8.8* 8.6* 8.8*  MG  --  1.8  --   PHOS  --  2.9  --      Sepsis Markers No results for input(s): LATICACIDVEN, PROCALCITON, O2SATVEN in the last 168 hours. ABG  Recent Labs Lab 12/03/14 1459 12/03/14 1500 12/04/14 0409  PHART 7.296* 7.459* 7.487*  PCO2ART 70.3* 52.8* 40.9  PO2ART 420* 69.5* 50.1*   Liver Enzymes  Recent Labs Lab 12/01/14 0107  AST 19  ALT 24  ALKPHOS 53  BILITOT 0.6  ALBUMIN 3.5   Glucose  Recent Labs Lab 12/04/14 0723 12/04/14 1206 12/04/14 1935 12/04/14 2319 12/05/14 0347 12/05/14 0746  GLUCAP 93 125* 112* 131* 93 184*    Imaging Dg Chest Port 1 View  12/05/2014  CLINICAL DATA:  Respiratory failure EXAM: PORTABLE CHEST  1 VIEW COMPARISON:  12/03/2014 FINDINGS: An endotracheal tube tip is between the clavicular heads and carina. The orogastric tube has been advanced, now reaching the stomach at least. New hazy opacities with costophrenic sulcus blunting consistent with small effusions. There is hyperinflation and emphysematous change. No consolidation or collapse. No pneumothorax. Normal heart size. IMPRESSION: 1. Endotracheal and orogastric tubes are in good position. 2. New trace pleural effusions. 3. COPD. Electronically Signed   By: Marnee SpringJonathon  Watts M.D.   On: 12/05/2014 05:29   Dg Abd Portable 1v  12/04/2014  CLINICAL DATA:  Orogastric tube placement EXAM: PORTABLE ABDOMEN - 1 VIEW COMPARISON:  Yesterday FINDINGS: Orogastric tube tip in similar position at the  expected location of the gastric body. Given lateral position, the stomach is likely distended. Bowel gas pattern is nonobstructive. Visualized lung bases are clear. Multilevel lumbar disc degeneration with L4-5 discectomy. Extensive atherosclerotic calcification. IMPRESSION: Orogastric tube tip at the mid gastric level. Electronically Signed   By: Marnee SpringJonathon  Watts M.D.   On: 12/04/2014 23:22     ASSESSMENT / PLAN:  PULMONARY A: Acute on chronic hypoxic/hypercapnic respiratory failure 2nd to AECOPD. Tobacco abuse. P:   Adjust oxygen to keep SpO2 89 to 95% SBTs with goal extubation Scheduled pulmicort, albuterol, ipratropium Smoking cessation  CARDIOVASCULAR A:  Acute on chronic diastolic dysfunction. Hx of HTN, HLD. P:  Continue ASA, lipitor, lasix,  Hold avapro  RENAL A:   Compensatory metabolic alkalosis. P:   F/u BMET Lasix 40 x 1  GASTROINTESTINAL A:   Protein calorie malnutrition. P:   Ct TFs Protonix for SUP  HEMATOLOGIC A:   Leukocytosis. P:  Lovenox for DVT prevention  INFECTIOUS A:   AECOPD - got levaquin x 7 ds P:   Off abx   ENDOCRINE A:   DM with steroid induced hyperglycemia.   P:   SSI  NEUROLOGIC A:   Acute encephalopathy 2nd to respiratory failure, complicated by ICU delerium - worse in pm Hx of chronic back pain, ETOH (in remission), Anxiety. P:   RASS goal: 0 Propofol + fentanyl gtt Hold outpt xanax ('seldom takes'), norco Added risperdal 0.5 bid 11/8 for delerium  Updated daughter 11/9  Summary - Unfortunately required intubation, prognosis guarded, high risk for vent dependence   The patient is critically ill with multiple organ systems failure and requires high complexity decision making for assessment and support, frequent evaluation and titration of therapies, application of advanced monitoring technologies and extensive interpretation of multiple databases. Critical Care Time devoted to patient care services described in  this note independent of APP time is 34 minutes.    Cyril Mourningakesh Alva MD. Tonny BollmanFCCP. [facility] Pager [phone] If no response call [phone]      12/05/2014, 8:29 AM

## 2014-12-05 NOTE — Progress Notes (Signed)
eLink Physician-Brief Progress Note Patient Name: Nicole Maxwell DOB: Apr 06, 1945 MRN: [id number]   Date of Service  12/05/2014  HPI/Events of Note  pseudomanas in BAL > 60 k s/p levaquin rx   eICU Interventions  Start zosyn     Intervention Category Major Interventions: Infection - evaluation and management  Sandrea HughsMichael Rogers Ditter 12/05/2014, 3:39 PM

## 2014-12-05 NOTE — Progress Notes (Signed)
   12/05/14 1200  Clinical Encounter Type  Visited With Patient and family together  Visit Type Initial;Critical Care  Referral From Other (Comment) (Rounding.  Pt post-extubation)  Consult/Referral To Nurse;Other (Comment) (resp. therapist)  Spiritual Encounters  Spiritual Needs Emotional  Stress Factors  Patient Stress Factors Health changes  Family Stress Factors Family relationships;Health changes   Chaplain providing support with pt and family due to recent extubation.  Pt on BiPap.  Communicates appreciation for chaplain visit.  Pt's daughter, grand daughter, great grand daughter at  at bedside.   Daughter has been primary support through pt's admission.  [place] in [place], [place].  [place].

## 2014-12-05 NOTE — Progress Notes (Signed)
Patient extubated to BiPAP via servo i per Dr. Vassie LollAlva. Dr. Vassie LollAlva wants patient to wear BiPAP mask for 4 hours and give patient a break as tolerated. Patient remained stable throughout and no complications were noted during procedure. RT will continue to monitor patient.

## 2014-12-05 NOTE — Progress Notes (Signed)
Nutrition Follow-up  DOCUMENTATION CODES:   Non-severe (moderate) malnutrition in context of chronic illness  INTERVENTION:   -Diet advancement per MD -If diet is unable to be advanced, resume enteral nutrition.  -RD to continue to monitor  NUTRITION DIAGNOSIS:   Malnutrition related to chronic illness as evidenced by moderate depletion of body fat, severe depletion of muscle mass.  Ongoing.  GOAL:   Patient will meet greater than or equal to 90% of their needs  Not meeting.  MONITOR:   Diet advancement, Labs, Weight trends, Skin, I & O's  ASSESSMENT:   69 y.o. female with PMH COPD oxygen dependant on chronic steroids with ongoing smoking that is brough to the ED by EMS, complaining of worsening SOB for 4 days progressively getting worst.   Pt has been extubated this AM, currently on Bi-Pap. Diet has not been advanced. Prior to extubation pt was receiving Vital HP at goal of 45 ml/hr. RD will monitor for plan and PO intake.  Labs reviewed: CBGs: 93-184 Elevated BUN & Creatinine  Diet Order:  Diet NPO time specified  Skin:  Reviewed, no issues  Last BM:  11/8  Height:   Ht Readings from Last 1 Encounters:  11/30/14 5\' 4"  (1.626 m)    Weight:   Wt Readings from Last 1 Encounters:  12/05/14 104 lb 0.9 oz (47.2 kg)    Ideal Body Weight:  54.5 kg  BMI:  Body mass index is 17.85 kg/(m^2).  Estimated Nutritional Needs:   Kcal:  1400-1600  Protein:  60-70g  Fluid:  1.6L/day  EDUCATION NEEDS:   No education needs identified at this time  Tilda FrancoLindsey Van, MS, RD, LDN Pager: [phone] After Hours Pager: [phone]

## 2014-12-05 NOTE — Evaluation (Signed)
Physical Therapy Evaluation Patient Details Name: Nicole Maxwell MRN: [id number] DOB: 29-Feb-1945 Today's Date: 12/05/2014   History of Present Illness  69 yo female admitted with acute exac of COPD, respiratory failure, intubation 11/8, extubation 11/10. Hx of COPD-O2 dep, HTn, osteopenia, NM d/o, DM, arthritis, chronic back pain.  Clinical Impression  On eval, pt required Min assist +2 for safety/lines/equipment-walked ~80 feet with RW. Tolerated activity well. O2 sats 91% on 4L O2, HR 125 bpm during ambulation. Difficult to understand pt on today so unable to get PLOF/home environment info. No family present during session. Recommend HHPT, 24 hour supervision/assist at this time.     Follow Up Recommendations Home health PT;Supervision/Assistance - 24 hour (may need ST SNF if no assist available)    Equipment Recommendations   (to be determined)    Recommendations for Other Services       Precautions / Restrictions Precautions Precautions: Fall Precaution Comments: monitor sats/vitals Restrictions Weight Bearing Restrictions: No      Mobility  Bed Mobility Overal bed mobility: Needs Assistance Bed Mobility: Supine to Sit     Supine to sit: Min guard     General bed mobility comments: close guard for safety. Increased time.   Transfers Overall transfer level: Needs assistance Equipment used: Rolling walker (2 wheeled) Transfers: Sit to/from Stand Sit to Stand: Min assist         General transfer comment: Assist to rise, stabilize, control descent. VCs safety, hand placement  Ambulation/Gait Ambulation/Gait assistance: Min assist;+2 safety/equipment Ambulation Distance (Feet): 80 Feet Assistive device: Rolling walker (2 wheeled) Gait Pattern/deviations: Step-through pattern;Decreased stride length;Narrow base of support     General Gait Details: VCs for safe use of RW, distance from RW, to maintain appropriate BOS. RN followed with recliner. 91% on 4L O2 during  ambulation, HR 125 bpm  Stairs            Wheelchair Mobility    Modified Rankin (Stroke Patients Only)       Balance Overall balance assessment: Needs assistance         Standing balance support: Bilateral upper extremity supported;During functional activity Standing balance-Leahy Scale: Poor                               Pertinent Vitals/Pain Pain Assessment: No/denies pain Start: 99 bpm, 100% 3.5L During: 125 bpm, 91% 4L End: 118 bpm, 93% 3.5L    Home Living                        Prior Function Level of Independence: Independent         Comments: not using walker/cane. Difficult to get full history due to s/p intubation day of eval     Hand Dominance        Extremity/Trunk Assessment   Upper Extremity Assessment: Generalized weakness           Lower Extremity Assessment: Generalized weakness      Cervical / Trunk Assessment: Normal  Communication   Communication: Expressive difficulties  Cognition Arousal/Alertness: Awake/alert Behavior During Therapy: WFL for tasks assessed/performed Overall Cognitive Status: Difficult to assess (mild confusion, inattention to task)                      General Comments      Exercises        Assessment/Plan    PT Assessment Patient needs continued  PT services  PT Diagnosis Difficulty walking;Generalized weakness   PT Problem List Decreased strength;Decreased activity tolerance;Decreased mobility;Decreased balance;Decreased knowledge of use of DME  PT Treatment Interventions DME instruction;Gait training;Functional mobility training;Therapeutic activities;Patient/family education;Balance training;Therapeutic exercise   PT Goals (Current goals can be found in the Care Plan section) Acute Rehab PT Goals Patient Stated Goal: none stated PT Goal Formulation: With patient Time For Goal Achievement: 12/19/14 Potential to Achieve Goals: Good    Frequency Min  3X/week   Barriers to discharge        Co-evaluation               End of Session Equipment Utilized During Treatment: Gait belt;Oxygen Activity Tolerance: Patient tolerated treatment well Patient left: in chair;with call bell/phone within reach           Time: 1422-1455 PT Time Calculation (min) (ACUTE ONLY): 33 min   Charges:   PT Evaluation $Initial PT Evaluation Tier I: 1 Procedure PT Treatments $Gait Training: 8-22 mins   PT G Codes:        Rebeca Alert, MPT Pager: [phone]

## 2014-12-05 NOTE — Progress Notes (Signed)
PHARMACY NOTE -  Zosyn  Pharmacy has been assisting with dosing of Zosyn for HCAP. Dosage ordered by MD is appropriate at Zosyn 3.375gm IV q8h (4hr extended infusions) and need for further dosage adjustment appears unlikely at present.    Will sign off at this time.  Please reconsult if a change in clinical status warrants re-evaluation of dosage.  Loralee PacasErin Jaimi Belle, PharmD, BCPS Pager: [phone] 12/05/2014 3:53 PM

## 2014-12-05 NOTE — Progress Notes (Signed)
  Date: November10, 2016 Chart reviewed for concurrent status and case management needs. Will continue to follow patient for changes and needs: Extubated to Bi-pap on [id number] Marcelle Smilinghonda Atalia Litzinger, RN, BSN, [place]   [phone]

## 2014-12-05 NOTE — Procedures (Signed)
Extubation Procedure Note  Patient Details:   Name: Nicole Maxwell DOB: 08/14/1945 MRN: [id number]   Airway Documentation:     Evaluation  O2 sats: stable throughout Complications: No apparent complications Patient did tolerate procedure well. Bilateral Breath Sounds: Diminished Suctioning: Airway Yes  Dannielle KarvonenMegan K [place] 12/05/2014, 9:56 AM

## 2014-12-05 NOTE — Progress Notes (Signed)
Patient was placed on Bipap for 4 hours post extubation. Patient is now on 2L [place] sat 97%. Patient in no distress. Bipap is not needed at this time. RT will continue to monitor as needed.

## 2014-12-05 NOTE — Progress Notes (Signed)
Nurse called eLink and talked to MD in regards to pt's  High blood pressure. MD was notified. Pt will continue to be monitored.

## 2014-12-05 NOTE — Progress Notes (Signed)
160 mL 7441f Fentanyl wasted in the sink, with Drue Stageraroline Creech, RN.

## 2014-12-06 ENCOUNTER — Inpatient Hospital Stay (HOSPITAL_COMMUNITY): Payer: Medicare Other

## 2014-12-06 LAB — GLUCOSE, CAPILLARY
GLUCOSE-CAPILLARY: 101 mg/dL — AB (ref 65–99)
GLUCOSE-CAPILLARY: 130 mg/dL — AB (ref 65–99)
GLUCOSE-CAPILLARY: 95 mg/dL (ref 65–99)
Glucose-Capillary: 89 mg/dL (ref 65–99)
Glucose-Capillary: 163 mg/dL — ABNORMAL HIGH (ref 65–99)

## 2014-12-06 LAB — CULTURE, BAL-QUANTITATIVE W GRAM STAIN

## 2014-12-06 LAB — BASIC METABOLIC PANEL
Anion gap: 8 (ref 5–15)
BUN: 37 mg/dL — ABNORMAL HIGH (ref 6–20)
CHLORIDE: 99 mmol/L — AB (ref 101–111)
CO2: 38 mmol/L — ABNORMAL HIGH (ref 22–32)
Calcium: 8.8 mg/dL — ABNORMAL LOW (ref 8.9–10.3)
Creatinine, Ser: 0.72 mg/dL (ref 0.44–1.00)
GFR calc non Af Amer: >60 mL/min (ref >60)
Glucose, Bld: 100 mg/dL — ABNORMAL HIGH (ref 65–99)
POTASSIUM: 3.3 mmol/L — AB (ref 3.5–5.1)
SODIUM: 145 mmol/L (ref 135–145)

## 2014-12-06 LAB — CBC
HEMATOCRIT: 38 % (ref 36.0–46.0)
HEMOGLOBIN: 11.3 g/dL — AB (ref 12.0–15.0)
MCH: 26.3 pg (ref 26.0–34.0)
MCHC: 29.7 g/dL — ABNORMAL LOW (ref 30.0–36.0)
MCV: 88.4 fL (ref 78.0–100.0)
Platelets: 238 10*3/uL (ref 150–400)
RBC: 4.3 MIL/uL (ref 3.87–5.11)
RDW: 14.5 % (ref 11.5–15.5)
WBC: 21.5 10*3/uL — AB (ref 4.0–10.5)

## 2014-12-06 LAB — CULTURE, RESPIRATORY W GRAM STAIN

## 2014-12-06 LAB — CULTURE, RESPIRATORY: SPECIAL REQUESTS: NORMAL

## 2014-12-06 LAB — CULTURE, BAL-QUANTITATIVE: SPECIAL REQUESTS: NORMAL

## 2014-12-06 MED ORDER — METHYLPREDNISOLONE SODIUM SUCC 40 MG IJ SOLR
40.0000 mg | Freq: Two times a day (BID) | INTRAMUSCULAR | Status: DC
  Administered 2014-12-06 – 2014-12-07 (×2): 40 mg via INTRAVENOUS
  Filled 2014-12-06 (×2): qty 1

## 2014-12-06 MED ORDER — RISPERIDONE 0.5 MG PO TABS
0.5000 mg | ORAL_TABLET | Freq: Every day | ORAL | Status: DC
  Administered 2014-12-06: 0.5 mg via ORAL
  Filled 2014-12-06 (×2): qty 1

## 2014-12-06 MED ORDER — IRBESARTAN 150 MG PO TABS
150.0000 mg | ORAL_TABLET | Freq: Every day | ORAL | Status: DC
  Administered 2014-12-06 – 2014-12-12 (×7): 150 mg via ORAL
  Filled 2014-12-06 (×7): qty 1

## 2014-12-06 MED ORDER — LEVOFLOXACIN 500 MG PO TABS
500.0000 mg | ORAL_TABLET | Freq: Every day | ORAL | Status: AC
  Administered 2014-12-06 – 2014-12-08 (×3): 500 mg via ORAL
  Filled 2014-12-06 (×3): qty 1

## 2014-12-06 MED ORDER — ALPRAZOLAM 0.5 MG PO TABS
0.5000 mg | ORAL_TABLET | Freq: Two times a day (BID) | ORAL | Status: DC | PRN
  Administered 2014-12-06 – 2014-12-12 (×10): 0.5 mg via ORAL
  Filled 2014-12-06 (×10): qty 1

## 2014-12-06 MED ORDER — PANTOPRAZOLE SODIUM 40 MG PO TBEC
40.0000 mg | DELAYED_RELEASE_TABLET | Freq: Every day | ORAL | Status: DC
  Administered 2014-12-06 – 2014-12-12 (×7): 40 mg via ORAL
  Filled 2014-12-06 (×7): qty 1

## 2014-12-06 MED ORDER — FUROSEMIDE 40 MG PO TABS
40.0000 mg | ORAL_TABLET | Freq: Every day | ORAL | Status: DC
  Administered 2014-12-06 – 2014-12-12 (×7): 40 mg via ORAL
  Filled 2014-12-06 (×8): qty 1

## 2014-12-06 NOTE — [place] FL2 (Signed)
[place] MEDICAID FL2 LEVEL OF CARE SCREENING TOOL     IDENTIFICATION  Patient Name: Nicole Maxwell Birthdate: 12-Jun-1945 Sex: female Admission Date (Current Location): 11/26/2014  [facility] and [place] Number:     Facility and Address:  [facility],  [address] [place] [address]      Provider Number: [id number]  Attending Physician Name and Address:  Oretha Milch, MD  Relative Name and Phone Number:       Current Level of Care: Hospital Recommended Level of Care: Skilled Nursing Facility Prior Approval Number:    Date Approved/Denied:   PASRR Number: [id number]  Discharge Plan: SNF    Current Diagnoses: Patient Active Problem List   Diagnosis Date Noted  . Acute respiratory failure (HCC)   . Normocytic anemia 11/27/2014  . Essential hypertension 11/27/2014  . Acute exacerbation of chronic obstructive pulmonary disease (COPD) (HCC) 11/26/2014  . Acute respiratory failure with hypoxia (HCC) 11/26/2014  . Leukocytosis 11/26/2014  . Physical deconditioning 07/01/2014  . Smoking 07/01/2014  . Pulmonary infiltrate in left lung on chest x-ray 04/16/2014  . DM type 2 (diabetes mellitus, type 2) (HCC) 04/16/2014  . Chronic respiratory failure (HCC) 04/16/2014  . Vitamin D deficiency disease 04/16/2014  . Pulmonary infiltrate 04/16/2014  . Esophageal dysmotility 02/27/2014  . HCAP (healthcare-associated pneumonia) 02/21/2014  . Healthcare-associated pneumonia 02/21/2014  . Abdominal pain, epigastric 01/09/2014  . Chronic diastolic congestive heart failure (HCC) 01/07/2014  . Malnutrition of moderate degree (HCC) 01/03/2014  . Acute on chronic respiratory failure (HCC) 01/03/2014  . COPD with acute exacerbation (HCC) 01/01/2014  . Hyponatremia 04/09/2012    Class: Acute  . Diabetes mellitus type 2, noninsulin dependent (HCC) 04/06/2012  . COPD exacerbation (HCC) 12/09/2011  . Preop pulmonary/respiratory exam 02/28/2011  . HTN (hypertension)  05/12/2010  . Hyperlipidemia 05/12/2010  . Severe chronic obstructive pulmonary disease (HCC) 04/29/2010  . Chest pain 04/29/2010    Orientation ACTIVITIES/SOCIAL BLADDER RESPIRATION    Self, Time, Situation, Place  Passive Indwelling catheter O2 (As needed) (4L)  BEHAVIORAL SYMPTOMS/MOOD NEUROLOGICAL BOWEL NUTRITION STATUS   (no behaviors)   Continent Diet (Carb Modified)  PHYSICIAN VISITS COMMUNICATION OF NEEDS Height & Weight Skin    Verbally  (162.6 cm) 112 lbs. Normal          AMBULATORY STATUS RESPIRATION    Assist extensive O2 (As needed) (4L)      Personal Care Assistance Level of Assistance  Bathing, Dressing Bathing Assistance: Limited assistance   Dressing Assistance: Limited assistance      Functional Limitations Info  Hearing, Sight, Speech Sight Info: Adequate Hearing Info: Adequate Speech Info: Adequate       SPECIAL CARE FACTORS FREQUENCY  PT (By licensed PT)     PT Frequency: 6-7 x wkly             Additional Factors Info  Insulin Sliding Scale, Psychotropic, Code Status Code Status Info: FULL CODE             Current Medications (12/06/2014): Current Facility-Administered Medications  Medication Dose Route Frequency Provider Last Rate Last Dose  . 0.9 %  sodium chloride infusion   Intravenous Continuous Cyril Mourning V, MD 10 mL/hr at 12/05/14 0700    . albuterol (PROVENTIL) (2.5 MG/3ML) 0.083% nebulizer solution 2.5 mg  2.5 mg Nebulization Q2H PRN Marinda Elk, MD   2.5 mg at 11/28/14 0047  . ALPRAZolam Prudy Feeler) tablet 0.5 mg  0.5 mg Oral BID PRN Cyril Mourning  V, MD      . antiseptic oral rinse solution (CORINZ)  7 mL Mouth Rinse QID Cyril Mourningakesh Alva V, MD   7 mL at 12/06/14 1204  . aspirin chewable tablet 81 mg  81 mg Per Tube Daily Simonne MartinetPeter E Babcock, NP   81 mg at 12/06/14 0943  . atorvastatin (LIPITOR) tablet 20 mg  20 mg Per Tube q1800 Simonne MartinetPeter E Babcock, NP   20 mg at 12/05/14 1710  . budesonide (PULMICORT) nebulizer solution 0.5  mg  0.5 mg Nebulization BID Coralyn HellingVineet Sood, MD   0.5 mg at 12/06/14 0903  . chlorhexidine gluconate (PERIDEX) 0.12 % solution 15 mL  15 mL Mouth Rinse BID Cyril Mourningakesh Alva V, MD   15 mL at 12/06/14 0846  . enoxaparin (LOVENOX) injection 40 mg  40 mg Subcutaneous Q2000 Meredeth IdeGagan S Lama, MD   40 mg at 12/05/14 2004  . furosemide (LASIX) tablet 40 mg  40 mg Oral Daily Oretha Milchakesh Alva V, MD   40 mg at 12/06/14 0943  . insulin aspart (novoLOG) injection 0-20 Units  0-20 Units Subcutaneous 6 times per day Simonne MartinetPeter E Babcock, NP   3 Units at 12/06/14 1201  . ipratropium-albuterol (DUONEB) 0.5-2.5 (3) MG/3ML nebulizer solution 3 mL  3 mL Nebulization QID Cyril Mourningakesh Alva V, MD   3 mL at 12/06/14 1231  . irbesartan (AVAPRO) tablet 150 mg  150 mg Oral Daily Cyril Mourningakesh Alva V, MD   150 mg at 12/06/14 0943  . levofloxacin (LEVAQUIN) tablet 500 mg  500 mg Oral Daily Cyril Mourningakesh Alva V, MD   500 mg at 12/06/14 0943  . methylPREDNISolone sodium succinate (SOLU-MEDROL) 40 mg/mL injection 40 mg  40 mg Intravenous Q12H Cyril Mourningakesh Alva V, MD      . ondansetron [facility](ZOFRAN) injection 4 mg  4 mg Intravenous Q6H PRN Marinda ElkAbraham Feliz Ortiz, MD   4 mg at 11/30/14 0920  . pantoprazole (PROTONIX) EC tablet 40 mg  40 mg Oral Daily Cyril Mourningakesh Alva V, MD   40 mg at 12/06/14 1034  . risperiDONE (RISPERDAL) tablet 0.5 mg  0.5 mg Oral QHS Cyril Mourningakesh Alva V, MD      . sodium chloride 0.9 % injection 3 mL  3 mL Intravenous Q12H Marinda ElkAbraham Feliz Ortiz, MD   3 mL at 12/06/14 0944   Do not use this list as official medication orders. Please verify with discharge summary.  Discharge Medications:   Medication List    ASK your doctor about these medications        acetaminophen 500 MG tablet  Commonly known as:  TYLENOL  Take 500-1,000 mg by mouth every 8 (eight) hours as needed (for pain).     albuterol 108 (90 BASE) MCG/ACT inhaler  Commonly known as:  PROVENTIL HFA;VENTOLIN HFA  Inhale 2 puffs into the lungs every 4 (four) hours as needed for wheezing or shortness of breath.      albuterol (2.5 MG/3ML) 0.083% nebulizer solution  Commonly known as:  PROVENTIL  Take 3 mLs (2.5 mg total) by nebulization every 4 (four) hours as needed for wheezing or shortness of breath. (may take 1 every 4 hours as needed for wheezing/shortness of breath) DX: 496     alprazolam 2 MG tablet  Commonly known as:  XANAX  Take 0.5-1 tablets (1-2 mg total) by mouth 3 (three) times daily as needed for anxiety.     aspirin 81 MG EC tablet  Take 1 tablet (81 mg total) by mouth daily.     atorvastatin 20  MG tablet  Commonly known as:  LIPITOR  Take 1 tablet daily     budesonide-formoterol 160-4.5 MCG/ACT inhaler  Commonly known as:  SYMBICORT  Inhale 2 puffs into the lungs 2 (two) times daily.     dextromethorphan-guaiFENesin 30-600 MG 12hr tablet  Commonly known as:  MUCINEX DM  Take 1 tablet by mouth 2 (two) times daily.     doxycycline 100 MG tablet  Commonly known as:  VIBRA-TABS  Take 1 tablet (100 mg total) by mouth 2 (two) times daily.     fluticasone 50 MCG/ACT nasal spray  Commonly known as:  FLONASE  Place 1 spray into both nostrils daily as needed for allergies or rhinitis.     furosemide 40 MG tablet  Commonly known as:  LASIX  Take 1 tablet (40 mg total) by mouth daily.     HYDROcodone-acetaminophen 5-325 MG tablet  Commonly known as:  NORCO/VICODIN  Take 1 tablet by mouth every 6 (six) hours as needed for moderate pain.     irbesartan 150 MG tablet  Commonly known as:  AVAPRO  Take 1 tablet (150 mg total) by mouth daily.     metFORMIN 500 MG tablet  Commonly known as:  GLUCOPHAGE  Take 500 mg by mouth 2 (two) times daily with a meal.     multivitamin tablet  Take 1 tablet by mouth daily.     pantoprazole 40 MG tablet  Commonly known as:  PROTONIX  Take 1 tablet (40 mg total) by mouth daily at 6 (six) AM.     predniSONE 10 MG tablet  Commonly known as:  DELTASONE   daily for 1 week then  daily for 1 week and then back to  daily      predniSONE 5 MG tablet  Commonly known as:  DELTASONE  Take 1 tablet (5 mg total) by mouth daily. After finishing prednisone taper     tiotropium 18 MCG inhalation capsule  Commonly known as:  SPIRIVA  Place 1 capsule (18 mcg total) into inhaler and inhale daily.        Relevant Imaging Results:  Relevant Lab Results:  Recent Labs    Additional Information SS # [id number]  Dustine Stickler, Dickey Gave, LCSW

## 2014-12-06 NOTE — Clinical Social Work Note (Signed)
Clinical Social Work Assessment  Patient Details  Name: Nicole Maxwell MRN: [id number] Date of Birth: 1945/06/13  Date of referral:  12/06/14               Reason for consult:  Discharge Planning                Permission sought to share information with:  Chartered certified accountant granted to share information::  Yes, Verbal Permission Granted  Name::        Agency::     Relationship::     Contact Information:     Housing/Transportation Living arrangements for the past 2 months:  Single Family Home Source of Information:  Patient, Adult Children Patient Interpreter Needed:  None Criminal Activity/Legal Involvement Pertinent to Current Situation/Hospitalization:  No - Comment as needed Significant Relationships:  Adult Children Lives with:  Self Do you feel safe going back to the place where you live?   (Home vs SNF) Need for family participation in patient care:  Yes (Comment)  Care giving concerns:  Pt may need more care than is available at home following hospital d/c.   Social Worker assessment / plan: Pt was hospitalized on 11/26/14 with acute exacerbation of COPD and acute respiratory failure. PN reviewed. PT is recommending home vs SNF depending on pt's progression. CSW met with pt to assist with d/c planning. Pt reports that she lives alone. She has a daughter and granddaughter that are supportive. Pt requested CSW contact her daughter, [name] ( [phone] ) to review d/c plans. [name] feels pt may benefit from [name] at d/c and gave CSW permission to initiate SNF search. CSW will provide pt / daughter with bed offers when available.  Employment status:  Retired Nurse, adult PT Recommendations:   (Home vs SNF) Information / Referral to community resources:  [place]  Patient/Family's Response to care: Disposition to be determined. Daughter feels [facility] Re[facility] may be needed.  Patient/Family's Understanding of  and Emotional Response to Diagnosis, Current Treatment, and Prognosis:  Pt reports that she is feeling a little better today. Pt reports that she has started to work with PT. " I'm tired but it feels good to get out of the bed. " Pt's daughter will speak to pt about possible rehab placement. CSW will continue to offer support and encouragement.  Emotional Assessment Appearance:  Appears stated age Attitude/Demeanor/Rapport:  Other (Cooperative) Affect (typically observed):  Calm, Pleasant Orientation:  Oriented to Self, Oriented to Place, Oriented to  Time, Oriented to Situation Alcohol / Substance use:  Not Applicable Psych involvement (Current and /or in the community):  No (Comment)  Discharge Needs  Concerns to be addressed:  Discharge Planning Concerns Readmission within the last 30 days:  No Current discharge risk:  None Barriers to Discharge:  No Barriers Identified   Nicole Maxwell [phone] 12/06/2014, 12:23 PM

## 2014-12-06 NOTE — Clinical Social Work Placement (Signed)
   CLINICAL SOCIAL WORK PLACEMENT  NOTE  Date:  12/06/2014  Patient Details  Name: Nicole Maxwell MRN: [id number] Date of Birth: December 30, 1945  Clinical Social Work is seeking post-discharge placement for this patient at the Skilled  Nursing Facility level of care (*CSW will initial, date and re-position this form in  chart as items are completed):  No   Patient/family provided with [facility] Clinical [facility] Work Department's list of facilities offering this level of care within the geographic area requested by the patient (or if unable, by the patient's family).  Yes   Patient/family informed of their freedom to choose among providers that offer the needed level of care, that participate in Medicare, Medicaid or managed care program needed by the patient, have an available bed and are willing to accept the patient.  Yes   Patient/family informed of [facility]'s ownership interest in [facility] and [facility], as well as of the fact that they are under no obligation to receive care at these facilities.  PASRR submitted to EDS on 12/06/14     PASRR number received on 12/06/14     Existing PASRR number confirmed on       FL2 transmitted to all facilities in geographic area requested by pt/family on 12/06/14     FL2 transmitted to all facilities within larger geographic area on       Patient informed that his/her managed care company has contracts with or will negotiate with certain facilities, including the following:            Patient/family informed of bed offers received.  Patient chooses bed at       Physician recommends and patient chooses bed at      Patient to be transferred to   on  .  Patient to be transferred to facility by       Patient family notified on   of transfer.  Name of family member notified:        PHYSICIAN       Additional Comment:    _______________________________________________ Royetta AsalHaidinger, Pamlea Finder Lee, LCSW  [phone] 12/06/2014, 2:26  PM

## 2014-12-06 NOTE — Progress Notes (Signed)
PULMONARY / CRITICAL CARE MEDICINE   Name: Nicole Maxwell MRN: [id number] DOB: 11/29/1945    ADMISSION DATE:  11/26/2014 CONSULTATION DATE:  11/29/2014 REFERRING MD :  [name]  CHIEF COMPLAINT:  Short of breath  INITIAL PRESENTATION:  69 yo female smoker admitted with dyspnea, cough, wheeze from AECOPD and acute on chronic diastolic dysfx.  Followed by Dr. Marchelle Gearing as outpt for GOLD D COPD (FEV1 0.5/29%, DLCO 27% from 04/13/10)  STUDIES:   SIGNIFICANT EVENTS: 11/01 Admit 11/05 altered mental status 11/06 BiPAP, precedex 11/7 Agitated by pm requiring higher dose  precedex & haldol  11/8 intubated for resp distress 11/9 weaned on PS 8/5 x 4h 11/10 extubated to bipap  SUBJECTIVE:  Afebrile Good UO oob to chair    VITAL SIGNS: Temp:  [98 F (36.7 C)-98.6 F (37 C)] 98.6 F (37 C) (11/11 0800) Pulse Rate:  [71-119] 101 (11/11 0800) Resp:  [11-28] 25 (11/11 0800) BP: (120-213)/(44-167) 154/123 mmHg (11/11 0800) SpO2:  [92 %-100 %] 100 % (11/11 0800) FiO2 (%):  [30 %] 30 % (11/10 1129) Weight:  [46.8 kg (103 lb 2.8 oz)] 46.8 kg (103 lb 2.8 oz) (11/11 0420) VENTILATOR SETTINGS: Vent Mode:  [-] BIPAP FiO2 (%):  [30 %] 30 % PEEP:  [5 cmH20] 5 cmH20 Pressure Support:  [10 cmH20] 10 cmH20 INTAKE / OUTPUT:  Intake/Output Summary (Last 24 hours) at 12/06/14 0847 Last data filed at 12/06/14 0800  Gross per 24 hour  Intake 364.45 ml  Output   1550 ml  Net -1185.55 ml    PHYSICAL EXAMINATION: General:  Thin, chr ill appearing Neuro:  interactive, non focal, hoarse voice HEENT:  Dry mouth  Cardiovascular:  regular Lungs:  B/l exp rhonchi, poor air movement, no wheeze Abdomen:  Soft, non tender Musculoskeletal:  No edema, decreased muscle bulk Skin:  No rashes  LABS:  CBC  Recent Labs Lab 12/03/14 0350 12/05/14 0408 12/06/14 0344  WBC 16.7* 21.9* 21.5*  HGB 11.8* 10.8* 11.3*  HCT 39.3 35.6* 38.0  PLT 242 218 238   Coag's  Recent Labs Lab 12/01/14 0107  INR  0.91   BMET  Recent Labs Lab 12/04/14 0345 12/05/14 0408 12/06/14 0344  NA 142 145 145  K 3.8 3.5 3.3*  CL 99* 99* 99*  CO2 32 35* 38*  BUN 39* 55* 37*  CREATININE 1.00 1.21* 0.72  GLUCOSE 124* 109* 100*   Electrolytes  Recent Labs Lab 12/04/14 0345 12/05/14 0408 12/06/14 0344  CALCIUM 8.6* 8.8* 8.8*  MG 1.8  --   --   PHOS 2.9  --   --      Sepsis Markers No results for input(s): LATICACIDVEN, PROCALCITON, O2SATVEN in the last 168 hours. ABG  Recent Labs Lab 12/03/14 1500 12/04/14 0409 12/05/14 0905  PHART 7.459* 7.487* 7.386  PCO2ART 52.8* 40.9 54.7*  PO2ART 69.5* 50.1* 70.8*   Liver Enzymes  Recent Labs Lab 12/01/14 0107  AST 19  ALT 24  ALKPHOS 53  BILITOT 0.6  ALBUMIN 3.5   Glucose  Recent Labs Lab 12/05/14 1119 12/05/14 1600 12/05/14 2010 12/05/14 2336 12/06/14 0409 12/06/14 0749  GLUCAP 151* 152* 92 129* 89 101*    Imaging Dg Chest Port 1 View  12/06/2014  CLINICAL DATA:  Respiratory failure EXAM: PORTABLE CHEST 1 VIEW COMPARISON:  Yesterday FINDINGS: Trace pleural effusions are stable. COPD with chronic hyperinflation. No superimposed pneumonia or edema. Stable aeration after tracheal and esophageal extubation. Normal heart size and mediastinal contours. IMPRESSION: Stable inflation  post extubation. Electronically Signed   By: Marnee SpringJonathon  Watts M.D.   On: 12/06/2014 06:10     ASSESSMENT / PLAN:  PULMONARY A: Acute on chronic hypoxic/hypercapnic respiratory failure 2nd to AECOPD. Tobacco abuse. P:   Adjust oxygen to keep SpO2 89 to 95% Scheduled pulmicort, albuterol, ipratropium Smoking cessation  CARDIOVASCULAR A:  Acute on chronic diastolic dysfunction. Hx of HTN, HLD. P:  Continue ASA, lipitor, lasix,  Resume avapro  RENAL A:   Compensatory metabolic alkalosis. Hypokalemia P:   F/u BMET Replete lytes as needed  GASTROINTESTINAL A:   Protein calorie malnutrition. P:   Advance PO Protonix for  SUP  HEMATOLOGIC A:   Leukocytosis. P:  Lovenox for DVT prevention  INFECTIOUS A:   AECOPD BAL- pseudomonas panS - got levaquin x 7 ds P:    dc zosyn (started by elink) ,  levaquin x 3more days -total 10  ENDOCRINE A:   DM with steroid induced hyperglycemia.   P:   SSI  NEUROLOGIC A:   Acute encephalopathy 2nd to respiratory failure, complicated by ICU delerium - worse in pm Hx of chronic back pain, ETOH (in remission), Anxiety. P:   Resume  outpt xanax ('seldom takes'), norco Added risperdal 0.5 bid 11/8 for delerium - drop to q hs & dc in 1-2 days  Updated daughter 11/10  Summary -  Has done well, short intubation, hope to progress with PT, transfer to floor in 24h   The patient is critically ill with multiple organ systems failure and requires high complexity decision making for assessment and support, frequent evaluation and titration of therapies, application of advanced monitoring technologies and extensive interpretation of multiple databases. Critical Care Time devoted to patient care services described in this note independent of APP time is 31 minutes.    Cyril Mourningakesh Smriti Barkow MD. Tonny BollmanFCCP. [facility] Pager [phone] If no response call [phone]      12/06/2014, 8:47 AM

## 2014-12-07 LAB — GLUCOSE, CAPILLARY
GLUCOSE-CAPILLARY: 104 mg/dL — AB (ref 65–99)
GLUCOSE-CAPILLARY: 98 mg/dL (ref 65–99)
Glucose-Capillary: 94 mg/dL (ref 65–99)
Glucose-Capillary: 100 mg/dL — ABNORMAL HIGH (ref 65–99)
Glucose-Capillary: 154 mg/dL — ABNORMAL HIGH (ref 65–99)

## 2014-12-07 MED ORDER — CHLORHEXIDINE GLUCONATE 0.12 % MT SOLN
OROMUCOSAL | Status: AC
  Administered 2014-12-07: 15 mL via OROMUCOSAL
  Filled 2014-12-07: qty 15

## 2014-12-07 MED ORDER — ACETAMINOPHEN 325 MG PO TABS
650.0000 mg | ORAL_TABLET | Freq: Four times a day (QID) | ORAL | Status: DC | PRN
  Administered 2014-12-07 (×2): 650 mg via ORAL
  Filled 2014-12-07 (×3): qty 2

## 2014-12-07 MED ORDER — INSULIN ASPART 100 UNIT/ML ~~LOC~~ SOLN: 0.0000 [IU] | Freq: Every day | SUBCUTANEOUS | Status: DC

## 2014-12-07 MED ORDER — INSULIN ASPART 100 UNIT/ML ~~LOC~~ SOLN
0.0000 [IU] | Freq: Three times a day (TID) | SUBCUTANEOUS | Status: DC
Start: 2014-12-07 — End: 2014-12-12
  Administered 2014-12-07 – 2014-12-08 (×2): 2 [IU] via SUBCUTANEOUS
  Administered 2014-12-09: 1 [IU] via SUBCUTANEOUS
  Administered 2014-12-09 – 2014-12-10 (×2): 2 [IU] via SUBCUTANEOUS
  Administered 2014-12-10: 1 [IU] via SUBCUTANEOUS
  Administered 2014-12-11: 2 [IU] via SUBCUTANEOUS
  Administered 2014-12-12: 1 [IU] via SUBCUTANEOUS

## 2014-12-07 MED ORDER — GUAIFENESIN ER 600 MG PO TB12
1200.0000 mg | ORAL_TABLET | Freq: Two times a day (BID) | ORAL | Status: DC
  Administered 2014-12-07 – 2014-12-12 (×11): 1200 mg via ORAL
  Filled 2014-12-07 (×12): qty 2

## 2014-12-07 MED ORDER — HYDROCODONE-ACETAMINOPHEN 5-325 MG PO TABS
1.0000 | ORAL_TABLET | Freq: Four times a day (QID) | ORAL | Status: DC | PRN
  Administered 2014-12-07 – 2014-12-12 (×6): 1 via ORAL
  Filled 2014-12-07 (×6): qty 1

## 2014-12-07 MED ORDER — HALOPERIDOL LACTATE 5 MG/ML IJ SOLN: 1.0000 mg | INTRAMUSCULAR | Status: DC | PRN

## 2014-12-07 MED ORDER — PREDNISONE 20 MG PO TABS
30.0000 mg | ORAL_TABLET | Freq: Every day | ORAL | Status: DC
  Administered 2014-12-07 – 2014-12-12 (×6): 30 mg via ORAL
  Filled 2014-12-07 (×10): qty 1

## 2014-12-07 NOTE — Progress Notes (Signed)
PULMONARY / CRITICAL CARE MEDICINE   Name: Nicole Maxwell MRN: [id number] DOB: Mar 04, 1945    ADMISSION DATE:  11/26/2014 CONSULTATION DATE:  11/29/2014 REFERRING MD :  Sharl MaLama  CHIEF COMPLAINT:  Short of breath  SUBJECTIVE:  C/o soreness in lower chest when she coughs and gets winded.  Has chest congestion.  VITAL SIGNS: BP 158/87 mmHg  Pulse 90  Temp(Src) 98.7 F (37.1 C) (Oral)  Resp 26  Ht 5\' 4"  (1.626 m)  Wt 102 lb 8.2 oz (46.5 kg)  BMI 17.59 kg/m2  SpO2 100%  INTAKE / OUTPUT: I/O last 3 completed shifts: In: 660 [P.O.:480; I.V.:180] Out: 400 [Urine:400]  PHYSICAL EXAMINATION: General:  Thin Neuro: normal strength, follows commands HEENT: raspy voice Cardiovascular:  Regular, no murmur Lungs: b/l rhonchi with faint wheeze Abdomen:  Soft, non tender Musculoskeletal:  No edema, decreased muscle bulk Skin:  No rashes  LABS: CBC Recent Labs     12/05/14  0408  12/06/14  0344  WBC  21.9*  21.5*  HGB  10.8*  11.3*  HCT  35.6*  38.0  PLT  218  238    BMET Recent Labs     12/05/14  0408  12/06/14  0344  NA  145  145  K  3.5  3.3*  CL  99*  99*  CO2  35*  38*  BUN  55*  37*  CREATININE  1.21*  0.72  GLUCOSE  109*  100*    Electrolytes Recent Labs     12/05/14  0408  12/06/14  0344  CALCIUM  8.8*  8.8*    ABG Recent Labs     12/05/14  0905  PHART  7.386  PCO2ART  54.7*  PO2ART  70.8*    Glucose Recent Labs     12/06/14  0749  12/06/14  1158  12/06/14  1536  12/06/14  2001  12/07/14  0024  12/07/14  0434  GLUCAP  101*  130*  95  163*  98  94    Imaging Dg Chest Port 1 View  12/06/2014  CLINICAL DATA:  Respiratory failure EXAM: PORTABLE CHEST 1 VIEW COMPARISON:  Yesterday FINDINGS: Trace pleural effusions are stable. COPD with chronic hyperinflation. No superimposed pneumonia or edema. Stable aeration after tracheal and esophageal extubation. Normal heart size and mediastinal contours. IMPRESSION: Stable inflation post extubation.  Electronically Signed   By: Marnee SpringJonathon  Watts M.D.   On: 12/06/2014 06:10   CULTURES: Sputum 11/08 >> Pseudomonas Respiratory viral panel 11/08 >> Rhinovirus BAL 11/09 >> Pseudmononas  STUDIES:   SIGNIFICANT EVENTS: 11/01 Admit 11/05 altered mental status 11/06 BiPAP, precedex 11/7 Agitated by pm requiring higher dose  precedex & haldol  11/8 intubated for resp distress 11/9 weaned on PS 8/5 x 4h 11/10 extubated to bipap  DISCUSSION:  69 yo female smoker admitted with dyspnea, cough, wheeze from AECOPD and acute on chronic diastolic dysfx.  Followed by Dr. Marchelle Gearingamaswamy as outpt for GOLD D COPD (FEV1 0.5/29%, DLCO 27% from 04/13/10).  Found to have rhinovirus and Pseudomonal tracheobronchitis.  ASSESSMENT / PLAN:  Acute on chronic respiratory failure 2nd to AECOPD with Rhinovirus/Pseudomonas in sputum. Hx of GOLD D COPD. Tobacco abuse. Plan: - pulmicort, albuterol, ipratropium - oxygen to keep SpO2 89 to 95% - f/u CXR as needed - bronchial hygiene  - day 3/10 of levaquin - change to prednisone 30 mg daily on 11/12  Acute on chronic diastolic dysfunction. Hx of HTN, HLD. Plan:  - Continue ASA,  lipitor, lasix, avapro  Hypokalemia. Plan:   - replace as needed    Protein calorie malnutrition. Plan:   - carb modified diet  DM with steroid induced hyperglycemia.   Plan:   - SSI  Acute encephalopathy 2nd to respiratory failure, complicated by ICU delirium >> improved. Hx of chronic back pain, ETOH (in remission), Anxiety. Plan:   Prn tylenol, norco D/c risperdal 11/12  Deconditioning. Plan: - PT  Lovenox for DVT prevention Protonix for SUP while on high dose steroids Goals of Care >> full code Disposition >> keep in SDU 11/12  Coralyn Helling, MD [facility] Pulmonary/Critical Care 12/07/2014, 8:28 AM Pager:  [phone] After 3pm call: [phone]

## 2014-12-07 NOTE — Progress Notes (Signed)
eLink Physician-Brief Progress Note Patient Name: Nicole Maxwell DOB: 05-14-1945 MRN: [id number]   Date of Service  12/07/2014  HPI/Events of Note  Confused per RN High fall risk  eICU Interventions  Haldol prn Posey restraints for safety     Intervention Category Intermediate Interventions: Communication with other healthcare providers and/or family  Asuncion Shibata V. 12/07/2014, 4:06 PM

## 2014-12-08 LAB — BASIC METABOLIC PANEL
Anion gap: 8 (ref 5–15)
BUN: 26 mg/dL — ABNORMAL HIGH (ref 6–20)
CALCIUM: 8.6 mg/dL — AB (ref 8.9–10.3)
CO2: 36 mmol/L — AB (ref 22–32)
CREATININE: 0.63 mg/dL (ref 0.44–1.00)
Chloride: 103 mmol/L (ref 101–111)
GFR calc non Af Amer: >60 mL/min (ref >60)
Glucose, Bld: 85 mg/dL (ref 65–99)
Potassium: 3 mmol/L — ABNORMAL LOW (ref 3.5–5.1)
SODIUM: 147 mmol/L — AB (ref 135–145)

## 2014-12-08 LAB — CBC
HCT: 38 % (ref 36.0–46.0)
Hemoglobin: 11.2 g/dL — ABNORMAL LOW (ref 12.0–15.0)
MCH: 26.3 pg (ref 26.0–34.0)
MCHC: 29.5 g/dL — AB (ref 30.0–36.0)
MCV: 89.2 fL (ref 78.0–100.0)
PLATELETS: 292 10*3/uL (ref 150–400)
RBC: 4.26 MIL/uL (ref 3.87–5.11)
RDW: 14.6 % (ref 11.5–15.5)
WBC: 20.5 10*3/uL — ABNORMAL HIGH (ref 4.0–10.5)

## 2014-12-08 LAB — GLUCOSE, CAPILLARY
GLUCOSE-CAPILLARY: 91 mg/dL (ref 65–99)
GLUCOSE-CAPILLARY: 153 mg/dL — AB (ref 65–99)
Glucose-Capillary: 182 mg/dL — ABNORMAL HIGH (ref 65–99)
Glucose-Capillary: 113 mg/dL — ABNORMAL HIGH (ref 65–99)

## 2014-12-08 MED ORDER — SODIUM CHLORIDE 0.9 % IV SOLN: INTRAVENOUS | Status: DC | PRN

## 2014-12-08 MED ORDER — ASPIRIN 325 MG PO TABS
325.0000 mg | ORAL_TABLET | Freq: Every day | ORAL | Status: DC
  Administered 2014-12-08 – 2014-12-09 (×2): 325 mg via ORAL
  Filled 2014-12-08 (×2): qty 1

## 2014-12-08 MED ORDER — ATORVASTATIN CALCIUM 20 MG PO TABS
20.0000 mg | ORAL_TABLET | Freq: Every day | ORAL | Status: DC
  Administered 2014-12-08 – 2014-12-11 (×4): 20 mg via ORAL
  Filled 2014-12-08 (×3): qty 1
  Filled 2014-12-08 (×2): qty 2

## 2014-12-08 NOTE — Progress Notes (Addendum)
PULMONARY / CRITICAL CARE MEDICINE   Name: Nicole Maxwell MRN: [id number] DOB: 1945-07-05    ADMISSION DATE:  11/26/2014 CONSULTATION DATE:  11/29/2014 REFERRING MD :  Sharl MaLama  CHIEF COMPLAINT:  Short of breath  SUBJECTIVE:  Still feels congested.  Confused overnight.  VITAL SIGNS: BP 153/57 mmHg  Pulse 94  Temp(Src) 98.5 F (36.9 C) (Oral)  Resp 18  Ht 5\' 4"  (1.626 m)  Wt 99 lb 10.4 oz (45.2 kg)  BMI 17.10 kg/m2  SpO2 99%  INTAKE / OUTPUT: I/O last 3 completed shifts: In: 3 [I.V.:3] Out: 1000 [Urine:1000]  PHYSICAL EXAMINATION: General:  Thin, sitting in chair Neuro: normal strength, follows commands HEENT: raspy voice Cardiovascular:  Regular, no murmur Lungs: scattered rhonchi Abdomen:  Soft, non tender Musculoskeletal:  No edema, decreased muscle bulk Skin:  No rashes  LABS: CBC Recent Labs     12/06/14  0344  12/08/14  0356  WBC  21.5*  20.5*  HGB  11.3*  11.2*  HCT  38.0  38.0  PLT  238  292    BMET Recent Labs     12/06/14  0344  12/08/14  0356  NA  145  147*  K  3.3*  3.0*  CL  99*  103  CO2  38*  36*  BUN  37*  26*  CREATININE  0.72  0.63  GLUCOSE  100*  85    Electrolytes Recent Labs     12/06/14  0344  12/08/14  0356  CALCIUM  8.8*  8.6*    ABG No results for input(s): PHART, PCO2ART, PO2ART in the last 72 hours.  Glucose Recent Labs     12/07/14  0024  12/07/14  0434  12/07/14  0811  12/07/14  1234  12/07/14  1643  12/08/14  0748  GLUCAP  98  94  100*  154*  104*  91    Imaging No results found.   CULTURES: Sputum 11/08 >> Pseudomonas Respiratory viral panel 11/08 >> Rhinovirus BAL 11/09 >> Pseudmononas  STUDIES:   SIGNIFICANT EVENTS: 11/01 Admit 11/05 altered mental status 11/06 BiPAP, precedex 11/07 Agitated by pm requiring higher dose  precedex & haldol  11/08 intubated for resp distress 11/09 weaned on PS 8/5 x 4h 11/10 extubated to bipap 11/13 transfer to floor bed   DISCUSSION:  69 yo female  smoker admitted with dyspnea, cough, wheeze from AECOPD and acute on chronic diastolic dysfx.  Followed by Dr. Marchelle Gearingamaswamy as outpt for GOLD D COPD (FEV1 0.5/29%, DLCO 27% from 04/13/10).  Found to have rhinovirus and Pseudomonal tracheobronchitis.  ASSESSMENT / PLAN:  Acute on chronic respiratory failure 2nd to AECOPD with Rhinovirus/Pseudomonas in sputum. Hx of GOLD D COPD. Tobacco abuse. Plan: - pulmicort, albuterol, ipratropium - oxygen to keep SpO2 89 to 95% - f/u CXR as needed - bronchial hygiene  - day 4/10 of levaquin - changed to prednisone 30 mg daily on 11/12  Acute on chronic diastolic dysfunction. Hx of HTN, HLD. Plan:  - Continue ASA, lipitor, lasix, avapro  Hypokalemia. Plan:   - replace as needed    Protein calorie malnutrition. Plan:   - carb modified diet  DM with steroid induced hyperglycemia.   Plan:   - SSI - resume metformin 11/13  Acute encephalopathy 2nd to respiratory failure, complicated by ICU delirium >> improved. Hx of chronic back pain, ETOH (in remission), Anxiety. Plan:   - Prn tylenol, norco - monitor mental status - sitter at bedside for  safety  Deconditioning. Plan: - PT  Lovenox for DVT prevention Protonix for SUP while on high dose steroids Goals of Care >> full code Disposition >> to floor bed 11/13  Coralyn Helling, MD [facility] Pulmonary/Critical Care 12/08/2014, 9:29 AM Pager:  [phone] After 3pm call: [phone]

## 2014-12-09 LAB — GLUCOSE, CAPILLARY
GLUCOSE-CAPILLARY: 142 mg/dL — AB (ref 65–99)
GLUCOSE-CAPILLARY: 197 mg/dL — AB (ref 65–99)
GLUCOSE-CAPILLARY: 108 mg/dL — AB (ref 65–99)
Glucose-Capillary: 111 mg/dL — ABNORMAL HIGH (ref 65–99)

## 2014-12-09 LAB — BASIC METABOLIC PANEL
Anion gap: 5 (ref 5–15)
BUN: 29 mg/dL — AB (ref 6–20)
CHLORIDE: 105 mmol/L (ref 101–111)
CO2: 37 mmol/L — ABNORMAL HIGH (ref 22–32)
Calcium: 8.2 mg/dL — ABNORMAL LOW (ref 8.9–10.3)
Creatinine, Ser: 0.73 mg/dL (ref 0.44–1.00)
GFR calc Af Amer: >60 mL/min (ref >60)
GLUCOSE: 118 mg/dL — AB (ref 65–99)
POTASSIUM: 2.9 mmol/L — AB (ref 3.5–5.1)
SODIUM: 147 mmol/L — AB (ref 135–145)

## 2014-12-09 LAB — MAGNESIUM: MAGNESIUM: 1.8 mg/dL (ref 1.7–2.4)

## 2014-12-09 MED ORDER — LEVOFLOXACIN 500 MG PO TABS
500.0000 mg | ORAL_TABLET | Freq: Every day | ORAL | Status: DC
  Administered 2014-12-09 – 2014-12-12 (×4): 500 mg via ORAL
  Filled 2014-12-09 (×4): qty 1

## 2014-12-09 MED ORDER — POTASSIUM CHLORIDE CRYS ER 20 MEQ PO TBCR
40.0000 meq | EXTENDED_RELEASE_TABLET | Freq: Once | ORAL | Status: AC
  Administered 2014-12-09: 40 meq via ORAL
  Filled 2014-12-09: qty 2

## 2014-12-09 MED ORDER — METFORMIN HCL 500 MG PO TABS
500.0000 mg | ORAL_TABLET | Freq: Two times a day (BID) | ORAL | Status: DC
  Administered 2014-12-10 – 2014-12-12 (×5): 500 mg via ORAL
  Filled 2014-12-09 (×8): qty 1

## 2014-12-09 MED ORDER — ASPIRIN EC 81 MG PO TBEC
81.0000 mg | DELAYED_RELEASE_TABLET | Freq: Every day | ORAL | Status: DC
  Administered 2014-12-10 – 2014-12-12 (×3): 81 mg via ORAL
  Filled 2014-12-09 (×3): qty 1

## 2014-12-09 MED ORDER — ADULT MULTIVITAMIN W/MINERALS CH
1.0000 | ORAL_TABLET | Freq: Every day | ORAL | Status: DC
  Administered 2014-12-09 – 2014-12-12 (×4): 1 via ORAL
  Filled 2014-12-09 (×4): qty 1

## 2014-12-09 MED ORDER — POTASSIUM CHLORIDE CRYS ER 20 MEQ PO TBCR
20.0000 meq | EXTENDED_RELEASE_TABLET | Freq: Every day | ORAL | Status: DC
  Administered 2014-12-09 – 2014-12-12 (×4): 20 meq via ORAL
  Filled 2014-12-09 (×4): qty 1

## 2014-12-09 NOTE — Progress Notes (Signed)
Nutrition Follow-up  DOCUMENTATION CODES:   Non-severe (moderate) malnutrition in context of chronic illness, Underweight  INTERVENTION:   Encourage PO intake of daily snacks from unit RD to continue to monitor  NUTRITION DIAGNOSIS:   Malnutrition related to chronic illness as evidenced by moderate depletion of body fat, severe depletion of muscle mass.  Ongoing.  GOAL:   Patient will meet greater than or equal to 90% of their needs  Not meeting.  MONITOR:   PO intake, Labs, Weight trends, Skin, I & O's  ASSESSMENT:   69 y.o. female with PMH COPD oxygen dependant on chronic steroids with ongoing smoking that is brough to the ED by EMS, complaining of worsening SOB for 4 days progressively getting worst.  11/10 extubated  Patient currently on CHO modified diet, eating 25-50%. Pt resistant to trying supplements. Continue to offer daily snacks.  Labs reviewed: CBGs: 111-153 Elevated Na, BUN Low K Mg WNL  Diet Order:  Diet Carb Modified Fluid consistency:: Thin; Room service appropriate?: Yes  Skin:  Reviewed, no issues  Last BM:  11/8  Height:   Ht Readings from Last 1 Encounters:  11/30/14 5\' 4"  (1.626 m)    Weight:   Wt Readings from Last 1 Encounters:  12/08/14 99 lb 10.4 oz (45.2 kg)    Ideal Body Weight:  54.5 kg  BMI:  Body mass index is 17.1 kg/(m^2).  Estimated Nutritional Needs:   Kcal:  1400-1600  Protein:  60-70g  Fluid:  1.6L/day  EDUCATION NEEDS:   No education needs identified at this time  Tilda FrancoLindsey Vlachos, MS, RD, LDN Pager: [phone] After Hours Pager: [phone]

## 2014-12-09 NOTE — Progress Notes (Signed)
Date: December 09, 2014 Chart reviewed for concurrent status and case management needs. Will continue to follow patient for changes and needs: Meline Russaw, RN, BSN, CCM   [phone] 

## 2014-12-09 NOTE — Progress Notes (Signed)
PULMONARY / CRITICAL CARE MEDICINE   Name: RIAN KOON MRN: [id number] DOB: May 29, 1945    ADMISSION DATE:  11/26/2014 CONSULTATION DATE:  11/29/2014 REFERRING MD :  [name]  CHIEF COMPLAINT:  Short of breath  SUBJECTIVE: Pt reports feeling tired and doesn't understand why she is not getting better quicker   VITAL SIGNS: BP 139/60 mmHg  Pulse 94  Temp(Src) 98.6 F (37 C) (Oral)  Resp 20  Ht  (1.626 m)  Wt 99 lb 10.4 oz (45.2 kg)  BMI 17.10 kg/m2  SpO2 99%  INTAKE / OUTPUT: I/O last 3 completed shifts: In: 243 [P.O.:240; I.V.:3] Out: 1300 [Urine:1300]  PHYSICAL EXAMINATION: General:  Thin adult female in NAD Neuro: normal strength, follows commands HEENT: raspy voice Cardiovascular:  Regular, no murmur Lungs: scattered rhonchi, cough with clearing, wheezing bilaterally  Abdomen:  Soft, non tender Musculoskeletal:  No edema, decreased muscle bulk Skin:  No rashes  LABS: CBC Recent Labs     12/08/14  0356  WBC  20.5*  HGB  11.2*  HCT  38.0  PLT  292   BMET Recent Labs     12/08/14  0356  12/09/14  0352  NA  147*  147*  K  3.0*  2.9*  CL  103  105  CO2  36*  37*  BUN  26*  29*  CREATININE  0.63  0.73  GLUCOSE  85  118*   Electrolytes Recent Labs     12/08/14  0356  12/09/14  0352  CALCIUM  8.6*  8.2*  MG   --   1.8   ABG No results for input(s): PHART, PCO2ART, PO2ART in the last 72 hours.  Glucose Recent Labs     12/07/14  1643  12/07/14  2146  12/08/14  0748  12/08/14  1156  12/08/14  1641  12/09/14  0755  GLUCAP  104*  182*  91  113*  153*  111*    Imaging No results found.   CULTURES: Sputum 11/08 >> Pseudomonas Respiratory viral panel 11/08 >> Rhinovirus BAL 11/09 >> Pseudmononas  STUDIES:   SIGNIFICANT EVENTS: 11/01 Admit 11/05 altered mental status 11/06 BiPAP, precedex 11/07 Agitated by pm requiring higher dose  precedex & haldol  11/08 intubated for resp distress 11/09 weaned on PS 8/5 x 4h 11/10 extubated to  bipap 11/13 transfer to floor bed    DISCUSSION:  69 y/o female smoker admitted with dyspnea, cough, wheeze from AECOPD and acute on chronic diastolic dysfx.  Followed by Dr. Marchelle Gearing as outpt for GOLD D COPD (FEV1 0.5/29%, DLCO 27% from 04/13/10).  Found to have rhinovirus and Pseudomonal tracheobronchitis.   ASSESSMENT / PLAN:  Acute on chronic respiratory failure 2nd to AECOPD with Rhinovirus/Pseudomonas in sputum. Hx of 2L O2 Dependent GOLD D COPD. Tobacco abuse. Plan: - pulmicort, albuterol, ipratropium - oxygen to keep SpO2 89 to 95% - f/u CXR as needed - bronchial hygiene, add flutter valve - day 5/10 of levaquin - changed to prednisone 30 mg daily on 11/12 - hold 11/14 with bronchospasm   Acute on chronic diastolic dysfunction. Hx of HTN, HLD. Plan:  - Continue ASA, lipitor, lasix, avapro  Hypokalemia. Plan:   - replace as needed - add 20 mEq KCL daily while on scheduled lasix     Protein calorie malnutrition. Plan:   - carb modified diet  DM with steroid induced hyperglycemia.   Plan:   - SSI - metformin restarted 11/13  Acute encephalopathy 2nd to  respiratory failure, complicated by ICU delirium >> improved. Hx of chronic back pain, ETOH (in remission), Anxiety. Plan:   - PRN tylenol, norco - monitor mental status - sitter at bedside for safety  Deconditioning. Plan: - PT  Lovenox for DVT prevention Protonix for SUP while on high dose steroids Goals of Care >> full code Disposition >> to floor bed 11/13      Canary BrimBrandi Sreeja Spies, NP-C [place] [facility] Pgr: [phone] or if no answer [phone] 12/09/2014, 9:02 AM

## 2014-12-09 NOTE — Progress Notes (Signed)
RT came to administer neb treatments and asked patient about O2 use at home. Patient states she is on 2 L/min cont.

## 2014-12-10 LAB — BASIC METABOLIC PANEL
Anion gap: 4 — ABNORMAL LOW (ref 5–15)
BUN: 24 mg/dL — ABNORMAL HIGH (ref 6–20)
CHLORIDE: 106 mmol/L (ref 101–111)
CO2: 35 mmol/L — AB (ref 22–32)
CREATININE: 0.61 mg/dL (ref 0.44–1.00)
Calcium: 8 mg/dL — ABNORMAL LOW (ref 8.9–10.3)
GFR calc non Af Amer: >60 mL/min (ref >60)
GLUCOSE: 94 mg/dL (ref 65–99)
Potassium: 3.1 mmol/L — ABNORMAL LOW (ref 3.5–5.1)
Sodium: 145 mmol/L (ref 135–145)

## 2014-12-10 LAB — CBC
HEMATOCRIT: 32.8 % — AB (ref 36.0–46.0)
HEMOGLOBIN: 9.8 g/dL — AB (ref 12.0–15.0)
MCH: 26.1 pg (ref 26.0–34.0)
MCHC: 29.9 g/dL — AB (ref 30.0–36.0)
MCV: 87.2 fL (ref 78.0–100.0)
Platelets: 258 10*3/uL (ref 150–400)
RBC: 3.76 MIL/uL — ABNORMAL LOW (ref 3.87–5.11)
RDW: 14.5 % (ref 11.5–15.5)
WBC: 19 10*3/uL — ABNORMAL HIGH (ref 4.0–10.5)

## 2014-12-10 LAB — GLUCOSE, CAPILLARY
GLUCOSE-CAPILLARY: 106 mg/dL — AB (ref 65–99)
GLUCOSE-CAPILLARY: 135 mg/dL — AB (ref 65–99)
Glucose-Capillary: 91 mg/dL (ref 65–99)
Glucose-Capillary: 158 mg/dL — ABNORMAL HIGH (ref 65–99)
Glucose-Capillary: 65 mg/dL (ref 65–99)
Glucose-Capillary: 81 mg/dL (ref 65–99)

## 2014-12-10 MED ORDER — POTASSIUM CHLORIDE CRYS ER 20 MEQ PO TBCR
20.0000 meq | EXTENDED_RELEASE_TABLET | Freq: Once | ORAL | Status: AC
  Administered 2014-12-10: 20 meq via ORAL
  Filled 2014-12-10: qty 1

## 2014-12-10 NOTE — Progress Notes (Signed)
Physical Therapy Treatment Patient Details Name: Nicole Maxwell MRN: [id number] DOB: [birth year] Today's Date: 12/10/2014    History of Present Illness 69 yo female admitted with acute exac of COPD, respiratory failure, intubation 11/8, extubation 11/10. Hx of COPD-O2 dep, HTn, osteopenia, NM d/o, DM, arthritis, chronic back pain.    PT Comments    Patient ambulate d x 80' x 3 with 2 liters. plans for SNF rehab  Follow Up Recommendations  SNF;Supervision/Assistance - 24 hour     Equipment Recommendations  None recommended by PT    Recommendations for Other Services       Precautions / Restrictions Precautions Precautions: Fall Precaution Comments: monitor sats/vitals    Mobility  Bed Mobility Overal bed mobility: Independent                Transfers   Equipment used: Rolling walker (2 wheeled) Transfers: Sit to/from Stand Sit to Stand: Supervision            Ambulation/Gait Ambulation/Gait assistance: Supervision Ambulation Distance (Feet): 80 Feet (x3) Assistive device: Rolling walker (2 wheeled) Gait Pattern/deviations: Step-through pattern     General Gait Details: cues for safety, followed with chair for breaks. [id number], sats >94% on 2 l   Stairs            Wheelchair Mobility    Modified Rankin (Stroke Patients Only)       Balance                                    Cognition Arousal/Alertness: Awake/alert                          Exercises      General Comments        Pertinent Vitals/Pain Pain Assessment: No/denies pain    Home Living                      Prior Function            PT Goals (current goals can now be found in the care plan section) Progress towards PT goals: Progressing toward goals    Frequency  Min 3X/week    PT Plan Current plan remains appropriate;Discharge plan needs to be updated    Co-evaluation             End of Session Equipment Utilized  During Treatment: Gait belt;Oxygen Activity Tolerance: Patient tolerated treatment well Patient left: in bed;with call bell/phone within reach;with family/visitor present     Time: [id number] PT Time Calculation (min) (ACUTE ONLY): 15 min  Charges:  $Gait Training: 8-22 mins                    G Codes:      Rada HayHill, Sicilia Killough Elizabeth 12/10/2014, 3:58 PM

## 2014-12-10 NOTE — Progress Notes (Signed)
PULMONARY / CRITICAL CARE MEDICINE   Name: Nicole Maxwell MRN: [id number] DOB: Oct 11, 1945    ADMISSION DATE:  11/26/2014 CONSULTATION DATE:  11/29/2014 REFERRING MD :  Sharl MaLama  CHIEF COMPLAINT:  Short of breath  SUBJECTIVE: Pt reports feeling better today - less cough, clearing mucus   VITAL SIGNS: BP 97/48 mmHg  Pulse 100  Temp(Src) 98.1 F (36.7 C) (Oral)  Resp 20  Ht 5\' 4"  (1.626 m)  Wt 99 lb 10.4 oz (45.2 kg)  BMI 17.10 kg/m2  SpO2 95%  INTAKE / OUTPUT: I/O last 3 completed shifts: In: 510 [P.O.:510] Out: 750 [Urine:750]  PHYSICAL EXAMINATION: General:  Thin adult female in NAD Neuro: normal strength, follows commands HEENT: raspy voice Cardiovascular:  Regular, no murmur Lungs: even/non-labored, prolonged exp phase, lungs with faint wheeze bilaterally (improved from 11/14) Abdomen:  Soft, non tender Musculoskeletal:  No edema, decreased muscle bulk Skin:  No rashes  LABS: CBC Recent Labs     12/08/14  0356  12/10/14  0548  WBC  20.5*  19.0*  HGB  11.2*  9.8*  HCT  38.0  32.8*  PLT  292  258   BMET Recent Labs     12/08/14  0356  12/09/14  0352  12/10/14  0548  NA  147*  147*  145  K  3.0*  2.9*  3.1*  CL  103  105  106  CO2  36*  37*  35*  BUN  26*  29*  24*  CREATININE  0.63  0.73  0.61  GLUCOSE  85  118*  94   Electrolytes Recent Labs     12/08/14  0356  12/09/14  0352  12/10/14  0548  CALCIUM  8.6*  8.2*  8.0*  MG   --   1.8   --    ABG No results for input(s): PHART, PCO2ART, PO2ART in the last 72 hours.  Glucose Recent Labs     12/09/14  0755  12/09/14  1232  12/09/14  1723  12/09/14  2132  12/10/14  0732  12/10/14  1139  GLUCAP  111*  142*  197*  108*  91  135*    Imaging No results found.   CULTURES: Sputum 11/08 >> Pseudomonas Respiratory viral panel 11/08 >> Rhinovirus BAL 11/09 >> Pseudmononas  STUDIES:   SIGNIFICANT EVENTS: 11/01 Admit 11/05 altered mental status 11/06 BiPAP, precedex 11/07 Agitated by pm  requiring higher dose  precedex & haldol  11/08 intubated for resp distress 11/09 weaned on PS 8/5 x 4h 11/10 extubated to bipap 11/13 transfer to floor bed    DISCUSSION:  69 y/o female smoker admitted with dyspnea, cough, wheeze from AECOPD and acute on chronic diastolic dysfx.  Followed by Dr. Marchelle Gearingamaswamy as outpt for GOLD D COPD (FEV1 0.5/29%, DLCO 27% from 04/13/10).  Found to have rhinovirus and Pseudomonal tracheobronchitis.   ASSESSMENT / PLAN:  Acute on chronic respiratory failure 2nd to AECOPD with Rhinovirus/Pseudomonas in sputum. Hx of 2L O2 Dependent GOLD D COPD. Tobacco abuse. Plan: - pulmicort, albuterol, ipratropium - oxygen to keep SpO2 89 to 95% - f/u CXR as needed - bronchial hygiene: flutter valve, mobilize - day 6/10 of levaquin - changed to prednisone 30 mg daily on 11/12 - plan for slow taper to off  - follow up with Dr. Marchelle Gearingamaswamy as outpatient (arranged)   Acute on chronic diastolic dysfunction. Hx of HTN, HLD. Plan:  - Continue ASA, lipitor, lasix, avapro  Hypokalemia. Plan:   -  replace as needed - add 20 mEq KCL daily while on scheduled lasix  - additional KCL on 11/15     Protein calorie malnutrition. Plan:   - carb modified diet  DM with steroid induced hyperglycemia.   Plan:   - SSI - metformin restarted 11/13  Acute encephalopathy 2nd to respiratory failure, complicated by ICU delirium >> improved. Hx of chronic back pain, ETOH (in remission), Anxiety. Plan:   - PRN tylenol, norco - monitor mental status - sitter at bedside for safety  Deconditioning. Plan: - PT  Lovenox for DVT prevention Protonix for SUP while on high dose steroids Goals of Care >> full code Disposition >> medical floor.  Anticipate discharge in next 24-48 hours assuming no clinical changes.      Canary Brim, NP-C [facility] Pgr: [phone] or if no answer [phone] 12/10/2014, 11:43 AM

## 2014-12-11 ENCOUNTER — Inpatient Hospital Stay (HOSPITAL_COMMUNITY): Payer: Medicare Other

## 2014-12-11 LAB — GLUCOSE, CAPILLARY
GLUCOSE-CAPILLARY: 102 mg/dL — AB (ref 65–99)
Glucose-Capillary: 193 mg/dL — ABNORMAL HIGH (ref 65–99)
Glucose-Capillary: 124 mg/dL — ABNORMAL HIGH (ref 65–99)
Glucose-Capillary: 128 mg/dL — ABNORMAL HIGH (ref 65–99)

## 2014-12-11 NOTE — Progress Notes (Addendum)
Hypoglycemic Event  CBG: 65  Treatment: 15 GM carbohydrate snack  Symptoms: None  Follow-up CBG: Time: 2147 CBG Result: 81  Possible Reasons for Event: Inadequate meal intake  Comments/MD notified:  Pt stated she wasn't hungry earlier and only wanted ice cream at dinner time.     Dwan Bolteid, Bralon Antkowiak Renee

## 2014-12-11 NOTE — Care Management Important Message (Signed)
Important Message  Patient Details  Name: Nicole Maxwell MRN: [id number] Date of Birth: [birth year]   Medicare Important Message Given:  Yes    Haskell FlirtJamison, Kennth Vanbenschoten 12/11/2014, 11:20 AMImportant Message  Patient Details  Name: Nicole Maxwell MRN: [id number] Date of Birth: [birth year]   Medicare Important Message Given:  Yes    Haskell FlirtJamison, Lizann Edelman 12/11/2014, 11:20 AM

## 2014-12-11 NOTE — Discharge Summary (Signed)
Physician Discharge Summary  Patient ID: Nicole Maxwell MRN: [id number] DOB/AGE: 27-Oct-1945 69 y.o.  Admit date: 11/26/2014 Discharge date: 12/12/2014    Discharge Diagnoses:  Acute on Chronic Respiratory Failure secondary to Acute Exacerbation of COPD GOLD D COPD Tobacco Abuse  Acute on Chronic Diastolic Dysfunction  Hypokalemia  Protein Calorie Malnutrition DM with Steroid Induced Hyperglycemia  Acute Encephalopathy in setting of Respiratory Failure Chronic Back Pain  Former ETOH Abuse  Anxiety   Deconditioning                                                                       DISCHARGE PLAN BY DIAGNOSIS     Acute on chronic respiratory failure 2nd to AECOPD with Rhinovirus/Pseudomonas in sputum. Hx of 2L O2 / 80m Prednisone Dependent GOLD D COPD. Tobacco abuse.  Discharge Plan: - Maintenance regimen:  symbicort + spiriva  - Rescue regimen:  Albuterol PRN - oxygen to keep SpO2 89 to 95% - bronchial hygiene: flutter valve, mobilize - day 8/10 of levaquin  - prednisone with slow taper to baseline of 585m- follow up with Dr. RaChase Callers outpatient (see below)  - smoking cessation   Acute on chronic diastolic dysfunction. Hx of HTN, HLD.  Discharge Plan: - Continue ASA, lipitor, lasix, avapro  Hypokalemia.  Discharge Plan: - KCL 20 mEq KCL daily while on scheduled lasix  - BMP in am    Protein calorie malnutrition.  Discharge Plan:  - carb modified diet  DM with steroid induced hyperglycemia.   Discharge Plan:  - continue metformin   Acute encephalopathy 2nd to respiratory failure, complicated by ICU delirium >> improved. Hx of chronic back pain, ETOH (in remission), Anxiety.  Discharge Plan: - PRN tylenol, norco - PRN xanax for anxiety   Deconditioning.  Discharge Plan: - PT                     DISCHARGE SUMMARY   Nicole KONICKIs a 69 y/o female, current smoker, with a PMH of HTN, HLD, DM, Chronic back pain, PVD,  former ETOH abuse, arthritis, anxiety and 2L O2  / baseline 5 mg QD prednisone (since 01/2014) dependent GOLD D COPD [facility] a four day history of worsening shortness of breath.    On admit, the patient reported increased SOB x 4 days with a productive cough.  She attempted to increase steroids and O2 without relief of dyspnea.  ER evaluation noted her to have acute on chronic hypoxic / hypercarbic respiratory failure.  CXR was negative for acute infiltrate.  [facility]  She was treated with IV antibiotics, nebulized bronchodilators and pulmonary hygiene.  Despite therapy, she declined with worsening mental status and was transferred to SDU for altered mental status and was placed on BiPAP.  ICU course was complicated by worsening mental status / acute delirium, increased sedation needs and steroid induced hyperglycemia.  She ultimately was intubated on 11/8 for acute respiratory distress.  The patient was liberated from mechanical ventilation on 11/10.  She was extubated to bipap and tolerated well.  The patient made slow progress and was transferred to the  medical floor on 11/13.  She was evaluated by PT and recommended for SNF placement due to deconditioning.               SIGNIFICANT EVENTS: 11/01  Admit 11/05  altered mental status 11/06  BiPAP, precedex 11/07  Agitated by pm requiring higher dose precedex & haldol  11/08  intubated for resp distress 11/09  weaned on PS 8/5 x 4h 11/10  extubated to bipap 11/13  transfer to floor bed   MICRO DATA  Sputum 11/08 >> Pseudomonas Respiratory viral panel 11/08 >> Rhinovirus BAL 11/09 >> Pseudmononas  TUBES / LINES ETT 11/8 >> 11/10   Discharge Exam: General: Thin adult female in NAD Neuro: normal strength, follows commands HEENT: raspy voice Cardiovascular: Regular, no murmur Lungs: even/non-labored, prolonged exp phase, lungs with  wheezing bilaterally Abdomen: Soft, non tender Musculoskeletal: No edema, decreased muscle bulk Skin: No rashes  Filed Vitals:   12/11/14 1948 12/11/14 2054 12/12/14 0458 12/12/14 0852  BP:  115/52 113/50   Pulse:  104 93   Temp:  98.2 F (36.8 C) 98.3 F (36.8 C)   TempSrc:  Oral Oral   Resp:  20 20   Height:      Weight:      SpO2: 99% 100% 100% 97%     Discharge Labs  BMET  Recent Labs Lab 12/06/14 0344 12/08/14 0356 12/09/14 0352 12/10/14 0548 12/12/14 0530  NA 145 147* 147* 145 142  K 3.3* 3.0* 2.9* 3.1* 4.1  CL 99* 103 105 106 105  CO2 38* 36* 37* 35* 31  GLUCOSE 100* 85 118* 94 93  BUN 37* 26* 29* 24* 26*  CREATININE 0.72 0.63 0.73 0.61 0.70  CALCIUM 8.8* 8.6* 8.2* 8.0* 8.2*  MG  --   --  1.8  --   --    CBC  Recent Labs Lab 12/08/14 0356 12/10/14 0548 12/12/14 0530  HGB 11.2* 9.8* 9.7*  HCT 38.0 32.8* 32.2*  WBC 20.5* 19.0* 19.0*  PLT 292 258 272        Follow-up Information    Follow up with [facility], MD On 12/23/2014.   Specialty:  Pulmonary Disease   Why:  Appt at 11:45    Contact information:   [address] [place] [address] [phone]       Follow up with [facility] .   Specialty:  [place] information:   [address] [place] [phone]         Medication List    TAKE these medications        acetaminophen 500 MG tablet  Commonly known as:  TYLENOL  Take 500-1,000 mg by mouth every 8 (eight) hours as needed (for pain).     albuterol 108 (90 BASE) MCG/ACT inhaler  Commonly known as:  PROVENTIL HFA;VENTOLIN HFA  Inhale 2 puffs into the lungs every 4 (four) hours as needed for wheezing or shortness of breath.     albuterol (2.5 MG/3ML) 0.083% nebulizer solution  Commonly known as:  PROVENTIL  Take 3 mLs (2.5 mg total) by nebulization every 4 (four) hours as needed for wheezing or shortness of breath. (may take 1  every 4 hours as needed for wheezing/shortness of breath) DX: 496     alprazolam 2 MG tablet  Commonly known as:  XANAX  Take 0.5 tablets (1 mg total) by mouth 3 (three) times daily as needed for anxiety.  aspirin 81 MG EC tablet  Take 1 tablet (81 mg total) by mouth daily.     atorvastatin 20 MG tablet  Commonly known as:  LIPITOR  Take 1 tablet daily     budesonide-formoterol 160-4.5 MCG/ACT inhaler  Commonly known as:  SYMBICORT  Inhale 2 puffs into the lungs 2 (two) times daily.     dextromethorphan-guaiFENesin 30-600 MG 12hr tablet  Commonly known as:  MUCINEX DM  Take 1 tablet by mouth 2 (two) times daily.     fluticasone 50 MCG/ACT nasal spray  Commonly known as:  FLONASE  Place 1 spray into both nostrils daily as needed for allergies or rhinitis.     furosemide 40 MG tablet  Commonly known as:  LASIX  Take 1 tablet (40 mg total) by mouth daily.     guaiFENesin 600 MG 12 hr tablet  Commonly known as:  MUCINEX  Take 2 tablets (1,200 mg total) by mouth 2 (two) times daily.     HYDROcodone-acetaminophen 5-325 MG tablet  Commonly known as:  NORCO/VICODIN  Take 1 tablet by mouth every 6 (six) hours as needed for moderate pain.     irbesartan 150 MG tablet  Commonly known as:  AVAPRO  Take 1 tablet (150 mg total) by mouth daily.     levofloxacin 500 MG tablet  Commonly known as:  LEVAQUIN  Take 1 tablet (500 mg total) by mouth daily.     metFORMIN 500 MG tablet  Commonly known as:  GLUCOPHAGE  Take 500 mg by mouth 2 (two) times daily with a meal.     multivitamin tablet  Take 1 tablet by mouth daily.     pantoprazole 40 MG tablet  Commonly known as:  PROTONIX  Take 1 tablet (40 mg total) by mouth daily at 6 (six) AM.     potassium chloride SA 20 MEQ tablet  Commonly known as:  K-DUR,KLOR-CON  Take 1 tablet (20 mEq total) by mouth daily. Take while on lasix     predniSONE 10 MG tablet  Commonly known as:  DELTASONE  3 tabs daily for 5 days, then 2 tabs  daily for 5 days, then 1 tab daily for 5 days, then 1/2 tab daily     tiotropium 18 MCG inhalation capsule  Commonly known as:  SPIRIVA  Place 1 capsule (18 mcg total) into inhaler and inhale daily.          Disposition:  SNF for rehab efforts.   Discharged Condition: Nicole Maxwell has met maximum benefit of inpatient care and is medically stable and cleared for discharge.  Patient is pending follow up as above.      Time spent on disposition:  Greater than 35 minutes.   Signed: Noe Gens, NP-C [place] [facility] Pgr: [phone] Office: [phone]

## 2014-12-11 NOTE — Progress Notes (Signed)
PULMONARY / CRITICAL CARE MEDICINE   Name: Nicole Maxwell MRN: [id number] DOB: Dec 05, 1945    ADMISSION DATE:  11/26/2014 CONSULTATION DATE:  11/29/2014 REFERRING MD :  Sharl MaLama  CHIEF COMPLAINT:  Short of breath  SUBJECTIVE: Pt frustrated that her cough is worse today, increased SOB.  Denies fevers/chills.  Thinks she may need rehab for discharge.    VITAL SIGNS: BP 107/50 mmHg  Pulse 96  Temp(Src) 98.1 F (36.7 C) (Oral)  Resp 20  Ht 5\' 4"  (1.626 m)  Wt 99 lb 10.4 oz (45.2 kg)  BMI 17.10 kg/m2  SpO2 100%  INTAKE / OUTPUT: I/O last 3 completed shifts: In: 630 [P.O.:630] Out: 100 [Urine:100]  PHYSICAL EXAMINATION: General:  Thin adult female in NAD Neuro: normal strength, follows commands HEENT: raspy voice Cardiovascular:  Regular, no murmur Lungs: even/non-labored, prolonged exp phase, lungs with increased wheezing bilaterally, moist / harsh cough Abdomen:  Soft, non tender Musculoskeletal:  No edema, decreased muscle bulk Skin:  No rashes  LABS: CBC Recent Labs     12/10/14  0548  WBC  19.0*  HGB  9.8*  HCT  32.8*  PLT  258   BMET Recent Labs     12/09/14  0352  12/10/14  0548  NA  147*  145  K  2.9*  3.1*  CL  105  106  CO2  37*  35*  BUN  29*  24*  CREATININE  0.73  0.61  GLUCOSE  118*  94   Electrolytes Recent Labs     12/09/14  0352  12/10/14  0548  CALCIUM  8.2*  8.0*  MG  1.8   --    ABG No results for input(s): PHART, PCO2ART, PO2ART in the last 72 hours.  Glucose Recent Labs     12/10/14  1139  12/10/14  1801  12/10/14  2042  12/10/14  2147  12/10/14  2332  12/11/14  0714  GLUCAP  135*  158*  65  81  106*  102*    Imaging No results found.   CULTURES: Sputum 11/08 >> Pseudomonas Respiratory viral panel 11/08 >> Rhinovirus BAL 11/09 >> Pseudmononas  STUDIES:   SIGNIFICANT EVENTS: 11/01 Admit 11/05 altered mental status 11/06 BiPAP, precedex 11/07 Agitated by pm requiring higher dose  precedex & haldol  11/08  intubated for resp distress 11/09 weaned on PS 8/5 x 4h 11/10 extubated to bipap 11/13 transfer to floor bed    DISCUSSION:  69 y/o female smoker admitted with dyspnea, cough, wheeze from AECOPD and acute on chronic diastolic dysfx.  Followed by Dr. Marchelle Gearingamaswamy as outpt for GOLD D COPD (FEV1 0.5/29%, DLCO 27% from 04/13/10).  Found to have rhinovirus and Pseudomonal tracheobronchitis.   ASSESSMENT / PLAN:  Acute on chronic respiratory failure 2nd to AECOPD with Rhinovirus/Pseudomonas in sputum. Hx of 2L O2 Dependent GOLD D COPD. Tobacco abuse. Plan: - pulmicort, albuterol, ipratropium - oxygen to keep SpO2 89 to 95% - f/u CXR as needed - bronchial hygiene: flutter valve, mobilize - day 7/10 of levaquin - change prednisone to solumedrol 40 mg Q8 with consideration for transition back to pred in am (bronchospasm) - follow up with Dr. Marchelle Gearingamaswamy as outpatient (arranged)  - f/u CXR in am 11/17 - smoking cessation   Acute on chronic diastolic dysfunction. Hx of HTN, HLD. Plan:  - Continue ASA, lipitor, lasix, avapro  Hypokalemia. Plan:   - replace as needed - add 20 mEq KCL daily while on scheduled lasix  -  BMP in am     Protein calorie malnutrition. Plan:   - carb modified diet  DM with steroid induced hyperglycemia.   Plan:   - SSI - metformin restarted 11/13  Acute encephalopathy 2nd to respiratory failure, complicated by ICU delirium >> improved. Hx of chronic back pain, ETOH (in remission), Anxiety. Plan:   - PRN tylenol, norco - monitor mental status - sitter at bedside for safety  Deconditioning. Plan: - PT - Plan for d/c to SNF   Lovenox for DVT prevention Protonix for SUP while on high dose steroids Goals of Care >> full code Disposition >> medical floor.  Anticipate discharge to SNF based on PT recommendations.      Canary Brim, NP-C  Pulmonary & Critical Care Pgr: [phone] or if no answer [phone] 12/11/2014, 11:38 AM

## 2014-12-11 NOTE — Progress Notes (Signed)
CSW was advised by RN that patient's daughter would like to speak with CSW.  CSW went to introduce self. Patient is currently not in room.  Daughter expressed [facility], however, CSW informed daughter that facility was not able to make a bed offer. Daughter informed CSW that she would like to view and tour some of the facilities before making a decision. CSW will follow up at a later time regarding decision.   Fernande BoydenJoyce Chauna Osoria, [facility] Clinical Social Worker [facility] [phone]

## 2014-12-12 LAB — BASIC METABOLIC PANEL
Anion gap: 6 (ref 5–15)
BUN: 26 mg/dL — AB (ref 6–20)
CALCIUM: 8.2 mg/dL — AB (ref 8.9–10.3)
CHLORIDE: 105 mmol/L (ref 101–111)
CO2: 31 mmol/L (ref 22–32)
CREATININE: 0.7 mg/dL (ref 0.44–1.00)
GFR calc non Af Amer: >60 mL/min (ref >60)
Glucose, Bld: 93 mg/dL (ref 65–99)
Potassium: 4.1 mmol/L (ref 3.5–5.1)
SODIUM: 142 mmol/L (ref 135–145)

## 2014-12-12 LAB — GLUCOSE, CAPILLARY
GLUCOSE-CAPILLARY: 79 mg/dL (ref 65–99)
Glucose-Capillary: 127 mg/dL — ABNORMAL HIGH (ref 65–99)

## 2014-12-12 LAB — CBC
HCT: 32.2 % — ABNORMAL LOW (ref 36.0–46.0)
Hemoglobin: 9.7 g/dL — ABNORMAL LOW (ref 12.0–15.0)
MCH: 26.8 pg (ref 26.0–34.0)
MCHC: 30.1 g/dL (ref 30.0–36.0)
MCV: 89 fL (ref 78.0–100.0)
PLATELETS: 272 10*3/uL (ref 150–400)
RBC: 3.62 MIL/uL — AB (ref 3.87–5.11)
RDW: 14.9 % (ref 11.5–15.5)
WBC: 19 10*3/uL — AB (ref 4.0–10.5)

## 2014-12-12 MED ORDER — ALPRAZOLAM 2 MG PO TABS: 1.0000 mg | ORAL_TABLET | Freq: Three times a day (TID) | ORAL | Status: DC | PRN

## 2014-12-12 MED ORDER — LEVOFLOXACIN 500 MG PO TABS: 500.0000 mg | ORAL_TABLET | Freq: Every day | ORAL | Status: DC

## 2014-12-12 MED ORDER — POTASSIUM CHLORIDE CRYS ER 20 MEQ PO TBCR: 20.0000 meq | EXTENDED_RELEASE_TABLET | Freq: Every day | ORAL | Status: DC

## 2014-12-12 MED ORDER — GUAIFENESIN ER 600 MG PO TB12: 1200.0000 mg | ORAL_TABLET | Freq: Two times a day (BID) | ORAL | Status: DC

## 2014-12-12 MED ORDER — HYDROCODONE-ACETAMINOPHEN 5-325 MG PO TABS: 1.0000 | ORAL_TABLET | Freq: Four times a day (QID) | ORAL | Status: DC | PRN

## 2014-12-12 MED ORDER — PREDNISONE 10 MG PO TABS: ORAL_TABLET | ORAL | Status: DC

## 2014-12-12 NOTE — Progress Notes (Signed)
Pt to [facility] today. PT[facility] pick up scheduled for 4pm. Pt's daughter will pick up home oxygen tank in pt's room.  Cori RazorJamie Satomi Buda LCSW [phone]

## 2014-12-12 NOTE — Clinical Social Work Placement (Signed)
   CLINICAL SOCIAL WORK PLACEMENT  NOTE  Date:  12/12/2014  Patient Details  Name: Nicole Maxwell MRN: [id number] Date of Birth: 11/21/1945  Clinical Social Work is seeking post-discharge placement for this patient at the Skilled  Nursing Facility level of care (*CSW will initial, date and re-position this form in  chart as items are completed):  No   Patient/family provided with [facility] Clinical Social Work Department's list of facilities offering this level of care within the geographic area requested by the patient (or if unable, by the patient's family).  Yes   Patient/family informed of their freedom to choose among providers that offer the needed level of care, that participate in Medicare, Medicaid or managed care program needed by the patient, have an available bed and are willing to accept the patient.  Yes   Patient/family informed of [facility]'s ownership interest in [facility] and [facility], as well as of the fact that they are under no obligation to receive care at these facilities.  PASRR submitted to EDS on 12/06/14     PASRR number received on 12/06/14     Existing PASRR number confirmed on       FL2 transmitted to all facilities in geographic area requested by pt/family on 12/06/14     FL2 transmitted to all facilities within larger geographic area on       Patient informed that his/her managed care company has contracts with or will negotiate with certain facilities, including the following:        Yes   Patient/family informed of bed offers received.  [facility]     Physician recommends and patient chooses bed at      [facility] 12[facility]12[facility]14.  Patient to be transferred to facility by PTAR     Patient family notified on 12/12/14 of transfer.  Name of family member notified:  DAUGHTER     PHYSICIAN       Additional Comment: Pt / daughter are in  [facility] PT[facility] transport required. Pt / daughter are aware that out of pocket costs may be associated with PTAR transport. D/C summary sent to SNF for review prior tom d/c. Scripts included in d/c packet.   _______________________________________________ Royetta AsalHaidinger, Natash Berman Lee, LCSW 12/12/2014, 3:17 PM

## 2014-12-12 NOTE — Progress Notes (Signed)
Gave report to Ilean Chinaobert Peyton, RN. Left number in case he had additional questions.

## 2014-12-12 NOTE — Progress Notes (Signed)
Nutrition Follow-up  DOCUMENTATION CODES:   Non-severe (moderate) malnutrition in context of chronic illness, Underweight  INTERVENTION:  - Continue Carb Modified diet - RD will continue to monitor for needs  NUTRITION DIAGNOSIS:   Malnutrition related to chronic illness as evidenced by moderate depletion of body fat, severe depletion of muscle mass. -ongoing, improving with good PO intakes  GOAL:   Patient will meet greater than or equal to 90% of their needs -met on average recently  MONITOR:   PO intake, Labs, Weight trends, Skin, I & O's  ASSESSMENT:   69 y.o. female with PMH COPD oxygen dependant on chronic steroids with ongoing smoking that is brough to the ED by EMS, complaining of worsening SOB for 4 days progressively getting worst.   Pt ate 100% of dinner last night and breakfast this AM. She states that she is waiting for her daughter to call and then she is going to order lunch: she plans to order pork roast, green beans, and sweet potato casserole. Pt reports improved and good appetite and denies any discomfort with eating.  Meeting needs on average. Medications reviewed. Labs reviewed; CBGs: 65-197 mg/dL, BUN elevated, Ca: 8.2 mg/dL.   Diet Order:  Diet Carb Modified Fluid consistency:: Thin; Room service appropriate?: Yes Diet - low sodium heart healthy  Skin:  Reviewed, no issues  Last BM:  11/16  Height:   Ht Readings from Last 1 Encounters:  11/30/14 '5\' 4"'  (1.626 m)    Weight:   Wt Readings from Last 1 Encounters:  12/08/14 99 lb 10.4 oz (45.2 kg)    Ideal Body Weight:  54.5 kg  BMI:  Body mass index is 17.1 kg/(m^2).  Estimated Nutritional Needs:   Kcal:  1400-1600  Protein:  60-70g  Fluid:  1.6L/day  EDUCATION NEEDS:   No education needs identified at this time     Jarome Matin, RD, LDN Inpatient Clinical Dietitian Pager # [phone] After hours/weekend pager # [phone]

## 2014-12-13 ENCOUNTER — Non-Acute Institutional Stay (SKILLED_NURSING_FACILITY): Payer: Medicare Other | Admitting: Adult Health

## 2014-12-13 ENCOUNTER — Encounter: Payer: Self-pay | Admitting: Adult Health

## 2014-12-13 DIAGNOSIS — J441 Chronic obstructive pulmonary disease with (acute) exacerbation: Secondary | ICD-10-CM

## 2014-12-13 DIAGNOSIS — I5032 Chronic diastolic (congestive) heart failure: Secondary | ICD-10-CM | POA: Diagnosis not present

## 2014-12-13 DIAGNOSIS — E119 Type 2 diabetes mellitus without complications: Secondary | ICD-10-CM | POA: Diagnosis not present

## 2014-12-13 DIAGNOSIS — K224 Dyskinesia of esophagus: Secondary | ICD-10-CM | POA: Diagnosis not present

## 2014-12-13 DIAGNOSIS — E785 Hyperlipidemia, unspecified: Secondary | ICD-10-CM

## 2014-12-13 DIAGNOSIS — J189 Pneumonia, unspecified organism: Secondary | ICD-10-CM | POA: Diagnosis not present

## 2014-12-13 NOTE — Progress Notes (Signed)
Patient ID: Nicole Maxwell, female   DOB: 1945/08/04, 69 y.o.   MRN: [id number]    Facility: [facility]      Allergies  Allergen Reactions  . Brovana [Arformoterol Tartrate] Shortness Of Breath and Cough    General intolerance  . Budesonide Shortness Of Breath and Cough    General intolerance    Chief Complaint  Patient presents with  . Hospitalization Follow-up    HPI:  She has been hospitalized for acute exacerbation for copd with acute on chronic respiratory failure; and acute on chronic diastolic heart failure. She is here for short term rehab. She is complaining of continued shortness of breath and coughing. She did tell me that she would like her respiratory treatments made routine at this time.    Past Medical History  Diagnosis Date  . Hypertension   . Chronic back pain     "goes from the lower part of my back on down my legs"  (04/17/2014)  . Osteopenia   . PVD (peripheral vascular disease) (HCC)   . Tobacco abuse     " I am addicted"   . Vitamin D deficiency disease     03/26/2010 - 9.9ng/mL. Started on replacemnt  . Alcohol abuse, in remission quit 2004  . Neuromuscular disorder (HCC)     lumbar disc  . Anxiety     Xanax prn  . Shortness of breath     with anxiety  and activty  . Asthma   . Hyperlipidemia   . On home oxygen therapy     "2L; suppose to be 24/7" (04/17/2014)  . COPD (chronic obstructive pulmonary disease) (HCC)     cxr 04/03/2010 - hyperinflation  . Pneumonia     "3 times in the last month or so" (04/17/2014)  . Type II diabetes mellitus (HCC)   . Arthritis     "my whole body"    Past Surgical History  Procedure Laterality Date  . Lumbar laminectomy/decompression microdiscectomy  2005    L4-5/notes 03/06/2010  . Appendectomy  1960  . Tonsillectomy  1960  . Posterior lumbar fusion  2002  . Abdominal hysterectomy      "partial"  . Back surgery    . Carpal tunnel release Bilateral     CTS repair 2000 (R), 2006 (L)/notes 01/01/2014  .  Cataract extraction w/ intraocular lens  implant, bilateral Bilateral 2015    VITAL SIGNS BP 107/51 mmHg  Pulse 85  Ht  (1.626 m)  Wt 107 lb (48.535 kg)  BMI 18.36 kg/m2  SpO2 96%  Patient's Medications  New Prescriptions   No medications on file  Previous Medications   ACETAMINOPHEN (TYLENOL) 500 MG TABLET    Take 500-1,000 mg by mouth every 8 (eight) hours as needed (for pain).   ALBUTEROL (PROVENTIL HFA;VENTOLIN HFA) 108 (90 BASE) MCG/ACT INHALER    Inhale 2 puffs into the lungs every 4 (four) hours as needed for wheezing or shortness of breath.    ALBUTEROL (PROVENTIL) (2.5 MG/3ML) 0.083% NEBULIZER SOLUTION    Take 3 mLs (2.5 mg total) by nebulization every 4 (four) hours as needed for wheezing or shortness of breath. (may take 1 every 4 hours as needed for wheezing/shortness of breath) DX: 496   ALPRAZOLAM (XANAX) 2 MG TABLET    Take 0.5 tablets (1 mg total) by mouth 3 (three) times daily as needed for anxiety.   ASPIRIN EC 81 MG EC TABLET    Take 1 tablet (81 mg total)  by mouth daily.   ATORVASTATIN (LIPITOR) 20 MG TABLET    Take 1 tablet daily   BUDESONIDE-FORMOTEROL (SYMBICORT) 160-4.5 MCG/ACT INHALER    Inhale 2 puffs into the lungs 2 (two) times daily.   DEXTROMETHORPHAN-GUAIFENESIN (MUCINEX DM) 30-600 MG PER 12 HR TABLET    Take 1 tablet by mouth 2 (two) times daily.   FLUTICASONE (FLONASE) 50 MCG/ACT NASAL SPRAY    Place 1 spray into both nostrils daily as needed for allergies or rhinitis.    FUROSEMIDE (LASIX) 40 MG TABLET    Take 1 tablet (40 mg total) by mouth daily.   GUAIFENESIN (MUCINEX) 600 MG 12 HR TABLET    Take 2 tablets (1,200 mg total) by mouth 2 (two) times daily.   HYDROCODONE-ACETAMINOPHEN (NORCO/VICODIN) 5-325 MG TABLET    Take 1 tablet by mouth every 6 (six) hours as needed for moderate pain.   IRBESARTAN (AVAPRO) 150 MG TABLET    Take 1 tablet (150 mg total) by mouth daily.   LEVOFLOXACIN (LEVAQUIN) 500 MG TABLET    Take 1 tablet (500 mg total) by mouth  daily.   METFORMIN (GLUCOPHAGE) 500 MG TABLET    Take 500 mg by mouth 2 (two) times daily with a meal.     MULTIPLE VITAMIN (MULTIVITAMIN) TABLET    Take 1 tablet by mouth daily.   PANTOPRAZOLE (PROTONIX) 40 MG TABLET    Take 1 tablet (40 mg total) by mouth daily at 6 (six) AM.   POTASSIUM CHLORIDE SA (K-DUR,KLOR-CON) 20 MEQ TABLET    Take 1 tablet (20 mEq total) by mouth daily. Take while on lasix   PREDNISONE (DELTASONE) 10 MG TABLET    3 tabs daily for 5 days, then 2 tabs daily for 5 days, then 1 tab daily for 5 days, then 1/2 tab daily   TIOTROPIUM (SPIRIVA) 18 MCG INHALATION CAPSULE    Place 1 capsule (18 mcg total) into inhaler and inhale daily.  Modified Medications   No medications on file  Discontinued Medications   No medications on file     SIGNIFICANT DIAGNOSTIC EXAMS  11-26-14: chest x-ray: COPD. Mild cephalization of the pulmonary vascularity may reflect low-grade CHF. There is no alveolar pneumonia. A follow-up PA and lateral chest x-ray would be useful.  12-01-15: kub: 1. No acute finding.  No evidence of bowel obstruction. 2. Moderate increased stool in the colon.   12-02-14: chest x-ray: Probable trace pleural effusions. COPD.  12-11-14: chest x-ray: COPD Possible small area of infiltrate or pleural fluid in the posterior lung base    LABS REVIEWED:   11-26-14: wbc 15.5; hgb 10.9; hct 35.9; mcv 88.9; plt 244; glucose 160; bun 14; creat 0.55; k+ 3.6; na++142; hgb a1c 6.3 11-27-14: wbc 13.1; hgb 10.2; hct 34.4; mcv 90.8; plt 229; glucose 169; bun 13; creat 0.51; k+ 4.1; na++140; liver normal albumin 3.1 11-29-14; wbc 19.9; hgb 11.0; hct 37.5; mcv 90.8; plt 252; glucose 121; bun 24; creat 0.58; k+ 4.2; na++142 12-01-14: wbc 29.2; hgb 12.2; hct 43.2; mcv 93.5; plt 252; glucose 119; bun 34; creat 0.77; k+ 4.5; na++145; liver normal albumin 3.5; mag 1.8 12-06-14: wbc 21.5; hgb 11.3; hct 38.0; mcv 88.4; plt 238; glucose 100; bun 37; creat 0.72; k+ 3.3; na++145 12-12-14: wbc  19.0; hgb 9.7; hct 32.2; mcv 89.0; plt 272; glucose 93; bun 26; creat 0.70; k+ 4.1; na++142     Review of Systems  Constitutional: Negative for malaise/fatigue.  Respiratory: Positive for cough, shortness of breath and wheezing.  Cardiovascular: Negative for chest pain and palpitations.  Gastrointestinal: Negative for heartburn, abdominal pain and constipation.  Musculoskeletal: Negative for myalgias and joint pain.  Skin: Negative.   Psychiatric/Behavioral: The patient is not nervous/anxious.      Physical Exam  Constitutional: She is oriented to person, place, and time. No distress.  Frail   Eyes: Conjunctivae are normal.  Neck: Neck supple. No JVD present. No thyromegaly present.  Cardiovascular: Normal rate, regular rhythm and intact distal pulses.   Respiratory: No respiratory distress. She has no wheezes.  Has increased respiratory effort present Breath sounds diminished throughout lung field   GI: Soft. Bowel sounds are normal. She exhibits no distension. There is no tenderness.  Musculoskeletal: She exhibits no edema.  Able to move all extremities   Lymphadenopathy:    She has no cervical adenopathy.  Neurological: She is alert and oriented to person, place, and time.  Skin: Skin is warm and dry. She is not diaphoretic.  Psychiatric: She has a normal mood and affect.     ASSESSMENT/ PLAN:  1. COPD with acute exacerbation with acute on chronic respiratory failure with hypoxia: will continue prednisone taper through 12-28-14 then 5 mg long term . Will continue symbicort 160-4.5 mcg 2 puffs twice daily; spiriva daily; will continue mucinex 1200 mg twice daily will change albuterol neb treatments every 4 hours routinely and every 2 hours as needed is on chronic 02 therapy will monitor  2. Pneumonia: will complete levaquin on 12-15-14 and will monitor  3. Chronic diastolic heart failure: will continue lasix 40 mg daily with k+ 20 meq daily asa 81 mg daily   4. Diabetes:  will continue metforin 500 mg twice daily with meals  5. Dyslipidemia: will continue lipitor 20 mg daily   6. Gerd: will continue protonix 40 mg daily   7.  Chronic back pain: is presently stable will continue vicodin 5/325 mg every 6 hours as needed will monitor  Will check cbc  Time spent with patient 50   minutes >50% time spent counseling; reviewing medical record; tests; labs; and developing future plan of care   Synthia Innocent NP [facility]  Contact [phone] Monday through Friday 8am- 5pm  After hours call [phone]

## 2014-12-17 ENCOUNTER — Non-Acute Institutional Stay (SKILLED_NURSING_FACILITY): Payer: Medicare Other | Admitting: Internal Medicine

## 2014-12-17 DIAGNOSIS — E119 Type 2 diabetes mellitus without complications: Secondary | ICD-10-CM

## 2014-12-17 DIAGNOSIS — J449 Chronic obstructive pulmonary disease, unspecified: Secondary | ICD-10-CM

## 2014-12-17 DIAGNOSIS — J3089 Other allergic rhinitis: Secondary | ICD-10-CM | POA: Diagnosis not present

## 2014-12-17 DIAGNOSIS — I5032 Chronic diastolic (congestive) heart failure: Secondary | ICD-10-CM | POA: Diagnosis not present

## 2014-12-17 DIAGNOSIS — E440 Moderate protein-calorie malnutrition: Secondary | ICD-10-CM | POA: Diagnosis not present

## 2014-12-17 DIAGNOSIS — I10 Essential (primary) hypertension: Secondary | ICD-10-CM | POA: Diagnosis not present

## 2014-12-17 DIAGNOSIS — K224 Dyskinesia of esophagus: Secondary | ICD-10-CM | POA: Diagnosis not present

## 2014-12-17 DIAGNOSIS — J9611 Chronic respiratory failure with hypoxia: Secondary | ICD-10-CM

## 2014-12-17 DIAGNOSIS — E785 Hyperlipidemia, unspecified: Secondary | ICD-10-CM | POA: Diagnosis not present

## 2014-12-20 ENCOUNTER — Encounter: Payer: Self-pay | Admitting: Internal Medicine

## 2014-12-23 ENCOUNTER — Ambulatory Visit: Payer: Medicare Other | Admitting: Internal Medicine

## 2014-12-23 ENCOUNTER — Non-Acute Institutional Stay (SKILLED_NURSING_FACILITY): Payer: Medicare Other | Admitting: Adult Health

## 2014-12-23 ENCOUNTER — Telehealth: Payer: Self-pay | Admitting: Internal Medicine

## 2014-12-23 DIAGNOSIS — K5901 Slow transit constipation: Secondary | ICD-10-CM | POA: Diagnosis not present

## 2014-12-23 DIAGNOSIS — J189 Pneumonia, unspecified organism: Secondary | ICD-10-CM | POA: Diagnosis not present

## 2014-12-23 DIAGNOSIS — I5032 Chronic diastolic (congestive) heart failure: Secondary | ICD-10-CM

## 2014-12-23 DIAGNOSIS — J441 Chronic obstructive pulmonary disease with (acute) exacerbation: Secondary | ICD-10-CM | POA: Diagnosis not present

## 2014-12-23 MED ORDER — FUROSEMIDE 40 MG PO TABS: 20.0000 mg | ORAL_TABLET | Freq: Every day | ORAL | Status: DC

## 2014-12-23 MED ORDER — FLUTICASONE PROPIONATE 50 MCG/ACT NA SUSP: 1.0000 | Freq: Two times a day (BID) | NASAL | Status: DC

## 2014-12-23 MED ORDER — AMOXICILLIN-POT CLAVULANATE 875-125 MG PO TABS
1.0000 | ORAL_TABLET | Freq: Two times a day (BID) | ORAL | Status: AC
Start: 2014-12-23 — End: 2015-01-06

## 2014-12-23 NOTE — Progress Notes (Signed)
Patient ID: Nicole Maxwell, female   DOB: 08-04-1945, 69 y.o.   MRN: [id number]    Facility[facility]      Allergies  Allergen Reactions  . Brovana [Arformoterol Tartrate] Shortness Of Breath and Cough    General intolerance  . Budesonide Shortness Of Breath and Cough    General intolerance    Chief Complaint  Patient presents with  . Acute Visit    patient concerns     HPI:  She has noted that she is developing worsening edema in her lower extremities; which has never been this bad. She has noted increase in her sputum production as well. She is also complaining of constipation. There are no reports of fever present; and no reports of appetite change.    Past Medical History  Diagnosis Date  . Hypertension   . Chronic back pain     "goes from the lower part of my back on down my legs"  (04/17/2014)  . Osteopenia   . PVD (peripheral vascular disease) (HCC)   . Tobacco abuse     " I am addicted"   . Vitamin D deficiency disease     03/26/2010 - 9.9ng/mL. Started on replacemnt  . Alcohol abuse, in remission quit 2004  . Neuromuscular disorder (HCC)     lumbar disc  . Anxiety     Xanax prn  . Shortness of breath     with anxiety  and activty  . Asthma   . Hyperlipidemia   . On home oxygen therapy     "2L; suppose to be 24/7" (04/17/2014)  . COPD (chronic obstructive pulmonary disease) (HCC)     cxr 04/03/2010 - hyperinflation  . Pneumonia     "3 times in the last month or so" (04/17/2014)  . Type II diabetes mellitus (HCC)   . Arthritis     "my whole body"    Past Surgical History  Procedure Laterality Date  . Lumbar laminectomy/decompression microdiscectomy  2005    L4-5/notes 03/06/2010  . Appendectomy  1960  . Tonsillectomy  1960  . Posterior lumbar fusion  2002  . Abdominal hysterectomy      "partial"  . Back surgery    . Carpal tunnel release Bilateral     CTS repair 2000 (R), 2006 (L)/notes 01/01/2014  . Cataract extraction w/ intraocular lens  implant,  bilateral Bilateral 2015    VITAL SIGNS BP 100/70 mmHg  Pulse 80  Resp 20  Ht  (1.626 m)  Wt 109 lb (49.442 kg)  BMI 18.70 kg/m2  SpO2 94%  Patient's Medications  New Prescriptions   No medications on file  Previous Medications   ACETAMINOPHEN (TYLENOL) 500 MG TABLET    Take 500-1,000 mg by mouth every 8 (eight) hours as needed (for pain).   ALBUTEROL (PROVENTIL HFA;VENTOLIN HFA) 108 (90 BASE) MCG/ACT INHALER    Inhale 2 puffs into the lungs every 4 (four) hours as needed for wheezing or shortness of breath.    ALBUTEROL (PROVENTIL) (2.5 MG/3ML) 0.083% NEBULIZER SOLUTION    Take 3 mLs (2.5 mg total) by nebulization every 4 (four) hours as needed for wheezing or shortness of breath. (may take 1 every 4 hours as needed for wheezing/shortness of breath) DX: 496   ALPRAZOLAM (XANAX) 2 MG TABLET    Take 0.5 tablets (1 mg total) by mouth 3 (three) times daily as needed for anxiety.   ASPIRIN EC 81 MG EC TABLET    Take 1 tablet (81  mg total) by mouth daily.   ATORVASTATIN (LIPITOR) 20 MG TABLET    Take 1 tablet daily   BUDESONIDE-FORMOTEROL (SYMBICORT) 160-4.5 MCG/ACT INHALER    Inhale 2 puffs into the lungs 2 (two) times daily.   DEXTROMETHORPHAN-GUAIFENESIN (MUCINEX DM) 30-600 MG PER 12 HR TABLET    Take 1 tablet by mouth 2 (two) times daily.   FLUTICASONE (FLONASE) 50 MCG/ACT NASAL SPRAY    Place 1 spray into both nostrils daily as needed for allergies or rhinitis.    FUROSEMIDE (LASIX) 40 MG TABLET    Take 1 tablet (40 mg total) by mouth daily.   GUAIFENESIN (MUCINEX) 600 MG 12 HR TABLET    Take 2 tablets (1,200 mg total) by mouth 2 (two) times daily.   HYDROCODONE-ACETAMINOPHEN (NORCO/VICODIN) 5-325 MG TABLET    Take 1 tablet by mouth every 6 (six) hours as needed for moderate pain.   IRBESARTAN (AVAPRO) 150 MG TABLET    Take 1 tablet (150 mg total) by mouth daily.   METFORMIN (GLUCOPHAGE) 500 MG TABLET    Take 500 mg by mouth 2 (two) times daily with a meal.     MULTIPLE VITAMIN  (MULTIVITAMIN) TABLET    Take 1 tablet by mouth daily.   PANTOPRAZOLE (PROTONIX) 40 MG TABLET    Take 1 tablet (40 mg total) by mouth daily at 6 (six) AM.   POTASSIUM CHLORIDE SA (K-DUR,KLOR-CON) 20 MEQ TABLET    Take 1 tablet (20 mEq total) by mouth daily. Take while on lasix   PREDNISONE (DELTASONE) 10 MG TABLET    5 mg daily    TIOTROPIUM (SPIRIVA) 18 MCG INHALATION CAPSULE    Place 1 capsule (18 mcg total) into inhaler and inhale daily.  Modified Medications   No medications on file  Discontinued Medications   LEVOFLOXACIN (LEVAQUIN) 500 MG TABLET    Take 1 tablet (500 mg total) by mouth daily.     SIGNIFICANT DIAGNOSTIC EXAMS   11-26-14: chest x-ray: COPD. Mild cephalization of the pulmonary vascularity may reflect low-grade CHF. There is no alveolar pneumonia. A follow-up PA and lateral chest x-ray would be useful.  12-01-15: kub: 1. No acute finding.  No evidence of bowel obstruction. 2. Moderate increased stool in the colon.   12-02-14: chest x-ray: Probable trace pleural effusions. COPD.  12-11-14: chest x-ray: COPD Possible small area of infiltrate or pleural fluid in the posterior lung base    LABS REVIEWED:   11-26-14: wbc 15.5; hgb 10.9; hct 35.9; mcv 88.9; plt 244; glucose 160; bun 14; creat 0.55; k+ 3.6; na++142; hgb a1c 6.3 11-27-14: wbc 13.1; hgb 10.2; hct 34.4; mcv 90.8; plt 229; glucose 169; bun 13; creat 0.51; k+ 4.1; na++140; liver normal albumin 3.1 11-29-14; wbc 19.9; hgb 11.0; hct 37.5; mcv 90.8; plt 252; glucose 121; bun 24; creat 0.58; k+ 4.2; na++142 12-01-14: wbc 29.2; hgb 12.2; hct 43.2; mcv 93.5; plt 252; glucose 119; bun 34; creat 0.77; k+ 4.5; na++145; liver normal albumin 3.5; mag 1.8 12-06-14: wbc 21.5; hgb 11.3; hct 38.0; mcv 88.4; plt 238; glucose 100; bun 37; creat 0.72; k+ 3.3; na++145 12-12-14: wbc 19.0; hgb 9.7; hct 32.2; mcv 89.0; plt 272; glucose 93; bun 26; creat 0.70; k+ 4.1; na++142     Review of Systems  Constitutional: Negative for  malaise/fatigue.  Respiratory: Positive for cough, shortness of breath and wheezing.   Cardiovascular: Negative for chest pain and palpitations.  Gastrointestinal: Negative for heartburn, abdominal pain and constipation.  Musculoskeletal: Negative for myalgias  and joint pain.  Skin: Negative.   Psychiatric/Behavioral: The patient is not nervous/anxious.      Physical Exam  Constitutional: She is oriented to person, place, and time. No distress.  Frail   Eyes: Conjunctivae are normal.  Neck: Neck supple. No JVD present. No thyromegaly present.  Cardiovascular: Normal rate, regular rhythm and intact distal pulses.   Respiratory: No respiratory distress. She has no wheezes.  Has increased respiratory effort present Breath sounds diminished throughout lung field   GI: Soft. Bowel sounds are normal. She exhibits no distension. There is no tenderness.  Musculoskeletal: She exhibits no edema.  Able to move all extremities   Lymphadenopathy:    She has no cervical adenopathy.  Neurological: She is alert and oriented to person, place, and time.  Skin: Skin is warm and dry. She is not diaphoretic.  Psychiatric: She has a normal mood and affect.     ASSESSMENT/ PLAN:  1. COPD with acute exacerbation with acute on chronic respiratory failure with hypoxia: will continue prednisone  5 mg long term . Will continue symbicort 160-4.5 mcg 2 puffs twice daily; spiriva daily; will continue mucinex 1200 mg twice daily albuterol neb treatments every 4 hours routinely and every 2 hours as needed is on chronic 02 therapy will monitor will change the flonase to 1 puff each nostril twice daily   2. Pneumonia: will get a chest x-ray; will begin augmentin 875 mg twice daily for 2 weeks with florastor twice daily  3. Chronic diastolic heart failure: will increase  lasix to 40 mg in the AM with 20 mg in the PM with k+ 20 meq daily will continue  asa 81 mg daily will wear ted hose daily will have daily weight  and reported weekly   4. Constipation: will begin  Senna s 2 tabs nightly        Synthia Innocent NP [facility]  Contact [phone] Monday through Friday 8am- 5pm  After hours call [phone]

## 2014-12-23 NOTE — Telephone Encounter (Signed)
[facility]

## 2014-12-23 NOTE — Telephone Encounter (Signed)
Pt could not keep appt with MR on 12/23/2014. Called and spoke to ForestdaleMaria, pt's daughter. Appt made with PM for HFU on 12/24/14. [name] verbalized understanding and denied any further questions or concerns at this time.

## 2014-12-23 NOTE — Telephone Encounter (Signed)
Spoke w/ Nicole HesselbachMaria. She is here now for visit with MR. Will sign off message

## 2014-12-24 ENCOUNTER — Encounter: Payer: Self-pay | Admitting: Pulmonary Disease

## 2014-12-24 ENCOUNTER — Ambulatory Visit (INDEPENDENT_AMBULATORY_CARE_PROVIDER_SITE_OTHER): Payer: Medicare Other | Admitting: Pulmonary Disease

## 2014-12-24 VITALS — BP 114/68 | HR 104 | Temp 98.7°F | Ht 64.0 in | Wt 118.0 lb

## 2014-12-24 DIAGNOSIS — J441 Chronic obstructive pulmonary disease with (acute) exacerbation: Secondary | ICD-10-CM

## 2014-12-24 NOTE — Progress Notes (Signed)
Subjective:    Patient ID: Nicole Maxwell, female    DOB: Mar 13, 1945, 69 y.o.   MRN: [id number]  HPI Follow up after recent hospital visit for COPD exacerbation.  69 Y/O with severe COPD (FEV1 0.5/29%, DLCO 27%) admitted to [facility] this month with AECOPD in the setting of rhinovirus and pan sensitive pseudomonas infections. She had a slow improvement in symptoms. She was treated with IV levaquin and discharged to rehab facilty on a slow pred taper. She was smoking upto this admission but has quit now.  At the rehab facility she was making slow improvement untill last night when she developed acute dyspnea. CXR done. I dont have these results. She was put back on prednisone 15 mg, augementin and started on lasix for LE edema. In office today she still has DOE and productive cough.  Past Medical History  Diagnosis Date  . Hypertension   . Chronic back pain     "goes from the lower part of my back on down my legs"  (04/17/2014)  . Osteopenia   . PVD (peripheral vascular disease) (HCC)   . Tobacco abuse     " I am addicted"   . Vitamin D deficiency disease     03/26/2010 - 9.9ng/mL. Started on replacemnt  . Alcohol abuse, in remission quit 2004  . Neuromuscular disorder (HCC)     lumbar disc  . Anxiety     Xanax prn  . Shortness of breath     with anxiety  and activty  . Asthma   . Hyperlipidemia   . On home oxygen therapy     "2L; suppose to be 24/7" (04/17/2014)  . COPD (chronic obstructive pulmonary disease) (HCC)     cxr 04/03/2010 - hyperinflation  . Pneumonia     "3 times in the last month or so" (04/17/2014)  . Type II diabetes mellitus (HCC)   . Arthritis     "my whole body"    Current outpatient prescriptions:  .  acetaminophen (TYLENOL) 500 MG tablet, Take 500-1,000 mg by mouth every 8 (eight) hours as needed (for pain)., Disp: , Rfl:  .  albuterol (PROVENTIL HFA;VENTOLIN HFA) 108 (90 BASE) MCG/ACT inhaler, Inhale 2 puffs into the lungs every 4 (four) hours as needed for  wheezing or shortness of breath. , Disp: , Rfl:  .  albuterol (PROVENTIL) (2.5 MG/3ML) 0.083% nebulizer solution, Take 3 mLs (2.5 mg total) by nebulization every 4 (four) hours as needed for wheezing or shortness of breath. (may take 1 every 4 hours as needed for wheezing/shortness of breath) DX: 496, Disp: 60 vial, Rfl: 12 .  alprazolam (XANAX) 2 MG tablet, Take 0.5 tablets (1 mg total) by mouth 3 (three) times daily as needed for anxiety., Disp: 30 tablet, Rfl: 0 .  amoxicillin-clavulanate (AUGMENTIN) 875-125 MG tablet, Take 1 tablet by mouth 2 (two) times daily., Disp: 20 tablet, Rfl: 0 .  aspirin EC 81 MG EC tablet, Take 1 tablet (81 mg total) by mouth daily., Disp: , Rfl:  .  atorvastatin (LIPITOR) 20 MG tablet, Take 1 tablet daily, Disp: , Rfl: 6 .  fluticasone (FLONASE) 50 MCG/ACT nasal spray, Place 1 spray into both nostrils 2 (two) times daily., Disp: 50 g, Rfl: prn .  furosemide (LASIX) 40 MG tablet, Take 0.5-1 tablets (20-40 mg total) by mouth daily. Take 40 mg in the AM and 20 mg in the PM, Disp: 30 tablet, Rfl: 3 .  guaiFENesin (MUCINEX) 600 MG 12 hr tablet, Take  2 tablets (1,200 mg total) by mouth 2 (two) times daily., Disp: , Rfl:  .  HYDROcodone-acetaminophen (NORCO/VICODIN) 5-325 MG tablet, Take 1 tablet by mouth every 6 (six) hours as needed for moderate pain., Disp: 15 tablet, Rfl: 0 .  irbesartan (AVAPRO) 150 MG tablet, Take 1 tablet (150 mg total) by mouth daily., Disp: 30 tablet, Rfl: 6 .  metFORMIN (GLUCOPHAGE) 500 MG tablet, Take 500 mg by mouth 2 (two) times daily with a meal.  , Disp: , Rfl:  .  potassium chloride SA (K-DUR,KLOR-CON) 20 MEQ tablet, Take 1 tablet (20 mEq total) by mouth daily. Take while on lasix, Disp: 30 tablet, Rfl: 0 .  budesonide-formoterol (SYMBICORT) 160-4.5 MCG/ACT inhaler, Inhale 2 puffs into the lungs 2 (two) times daily., Disp: 1 Inhaler, Rfl: 12 .  dextromethorphan-guaiFENesin (MUCINEX DM) 30-600 MG per 12 hr tablet, Take 1 tablet by mouth 2 (two)  times daily. (Patient taking differently: Take 2 tablets by mouth 2 (two) times daily. ), Disp: , Rfl:  .  Multiple Vitamin (MULTIVITAMIN) tablet, Take 1 tablet by mouth daily., Disp: , Rfl:  .  pantoprazole (PROTONIX) 40 MG tablet, Take 1 tablet (40 mg total) by mouth daily at 6 (six) AM., Disp: 30 tablet, Rfl: 3 .  predniSONE (DELTASONE) 10 MG tablet, 3 tabs daily for 5 days, then 2 tabs daily for 5 days, then 1 tab daily for 5 days, then 1/2 tab daily (Patient taking differently: 5 mg daily with breakfast. ), Disp: 50 tablet, Rfl: 3 .  tiotropium (SPIRIVA) 18 MCG inhalation capsule, Place 1 capsule (18 mcg total) into inhaler and inhale daily., Disp: 30 capsule, Rfl: 1  Review of Systems Cough with yellow sputum production, dyspnea on exertion, wheezing. Denies any fevers, chills, chest pain, palpitations. Denies any nausea, vomiting, diarrhea, constipation. All other review of systems are negative    Objective:   Physical Exam  Blood pressure 114/68, pulse 104, temperature 98.7 F (37.1 C), temperature source Oral, height 5\' 4"  (1.626 m), weight 118 lb (53.524 kg), SpO2 98 %.  Gen: Mild distress HEENT: PERRLA, MMM CVS: RRR, No MRG RS: Clear, no wheeze or crackles. Abd: Soft, + BS Ext: 1-2 + pitting edema    Assessment & Plan:  Severe COPD with recent exacerbation.  Pt is slowly improving. She had an acute episode of dyspnea yesterday for unclear etiology. Now back on abx (augmentin), lasix and prednisone,. She seems better in office today. She seems to be only on 15 mg of prednisone. If her breathing worsens then she will need to be on a higher dose of steroids.  We will also try to obtain the chest x-ray report that was done yesterday. [facility].  Chilton GreathousePraveen Matilda Fleig MD [facility] Pager [phone] If no answer or after 3pm call: [phone] 12/24/2014, 3:37 PM

## 2014-12-24 NOTE — Patient Instructions (Addendum)
Continue the Augmentin, prednisone, Lasix. We will try to obtain the results of the chest x-ray.  Continue using supplemental oxygen. We will follow-up in one month.

## 2014-12-25 ENCOUNTER — Telehealth: Payer: Self-pay | Admitting: Internal Medicine

## 2014-12-25 NOTE — Telephone Encounter (Signed)
Called and spoke to pt's son-in-law, Rashaun. Lodema PilotRashaun is requesting a letter excusing him from work on 12/24/14 for bringing pt to appt with PM.   Dr. Isaiah SergeMannam please advise. Thanks.

## 2014-12-26 ENCOUNTER — Encounter: Payer: Self-pay | Admitting: *Deleted

## 2014-12-26 NOTE — Telephone Encounter (Signed)
I have faxed letter to number provided.  LMTCB for son in law

## 2014-12-26 NOTE — Telephone Encounter (Signed)
Awaiting PM's response 

## 2014-12-26 NOTE — Telephone Encounter (Signed)
OK to send letter

## 2014-12-26 NOTE — Telephone Encounter (Signed)
The fax number for the note is  [phone]

## 2014-12-26 NOTE — Telephone Encounter (Signed)
Pt son in law cb, informed him per mindy last note, work note has been faxed, he verbalized understanding nothing further needed

## 2015-01-03 ENCOUNTER — Non-Acute Institutional Stay (SKILLED_NURSING_FACILITY): Payer: Medicare Other | Admitting: Adult Health

## 2015-01-03 DIAGNOSIS — J9620 Acute and chronic respiratory failure, unspecified whether with hypoxia or hypercapnia: Secondary | ICD-10-CM | POA: Diagnosis not present

## 2015-01-03 DIAGNOSIS — I5032 Chronic diastolic (congestive) heart failure: Secondary | ICD-10-CM

## 2015-01-03 DIAGNOSIS — J441 Chronic obstructive pulmonary disease with (acute) exacerbation: Secondary | ICD-10-CM | POA: Diagnosis not present

## 2015-01-03 DIAGNOSIS — R5381 Other malaise: Secondary | ICD-10-CM

## 2015-01-06 ENCOUNTER — Other Ambulatory Visit: Payer: Self-pay | Admitting: Adult Health

## 2015-01-07 ENCOUNTER — Encounter: Payer: Self-pay | Admitting: Adult Health

## 2015-01-07 DIAGNOSIS — K5901 Slow transit constipation: Secondary | ICD-10-CM | POA: Insufficient documentation

## 2015-01-07 NOTE — Progress Notes (Signed)
Patient ID: Nicole Maxwell, female   DOB: 10-07-1945, 69 y.o.   MRN: [id number]   [facility]ility[facility]      Allergies  Allergen Reactions  . Brovana [Arformoterol Tartrate] Shortness Of Breath and Cough    General intolerance  . Budesonide Shortness Of Breath and Cough    General intolerance    Chief Complaint  Patient presents with  . Discharge Note    HPI:  She is being discharged to home with home health for pt and rn. She will not need dme. She will need her prescriptions to be written and will need to follow up with pcp.  She had been hospitalized for her copd and acute diastolic heart failure. She was admitted to this facility for short term rehab.   Past Medical History  Diagnosis Date  . Hypertension   . Chronic back pain     "goes from the lower part of my back on down my legs"  (04/17/2014)  . Osteopenia   . PVD (peripheral vascular disease) (HCC)   . Tobacco abuse     " I am addicted"   . Vitamin D deficiency disease     03/26/2010 - 9.9ng/mL. Started on replacemnt  . Alcohol abuse, in remission quit 2004  . Neuromuscular disorder (HCC)     lumbar disc  . Anxiety     Xanax prn  . Shortness of breath     with anxiety  and activty  . Asthma   . Hyperlipidemia   . On home oxygen therapy     "2L; suppose to be 24/7" (04/17/2014)  . COPD (chronic obstructive pulmonary disease) (HCC)     cxr 04/03/2010 - hyperinflation  . Pneumonia     "3 times in the last month or so" (04/17/2014)  . Type II diabetes mellitus (HCC)   . Arthritis     "my whole body"    Past Surgical History  Procedure Laterality Date  . Lumbar laminectomy/decompression microdiscectomy  2005    L4-5/notes 03/06/2010  . Appendectomy  1960  . Tonsillectomy  1960  . Posterior lumbar fusion  2002  . Abdominal hysterectomy      "partial"  . Back surgery    . Carpal tunnel release Bilateral     CTS repair 2000 (R), 2006 (L)/notes 01/01/2014  . Cataract extraction w/ intraocular lens  implant,  bilateral Bilateral 2015    VITAL SIGNS BP 121/53 mmHg  Pulse 87  Ht  (1.626 m)  Wt 120 lb (54.432 kg)  BMI 20.59 kg/m2  SpO2 98%  Patient's Medications  New Prescriptions   No medications on file  Previous Medications   ACETAMINOPHEN (TYLENOL) 500 MG TABLET    Take 500-1,000 mg by mouth every 8 (eight) hours as needed (for pain).   ALBUTEROL (PROVENTIL HFA;VENTOLIN HFA) 108 (90 BASE) MCG/ACT INHALER    Inhale 2 puffs into the lungs every 4 (four) hours as needed for wheezing or shortness of breath.    ALBUTEROL (PROVENTIL) (2.5 MG/3ML) 0.083% NEBULIZER SOLUTION    Take 3 mLs (2.5 mg total) by nebulization every 4 (four) hours as needed for wheezing or shortness of breath. (may take 1 every 4 hours as needed for wheezing/shortness of breath) DX: 496   ALPRAZOLAM (XANAX) 2 MG TABLET    Take 0.5 tablets (1 mg total) by mouth 3 (three) times daily as needed for anxiety.   ASPIRIN EC 81 MG EC TABLET    Take 1 tablet (81 mg total)  by mouth daily.   ATORVASTATIN (LIPITOR) 20 MG TABLET    Take 1 tablet daily   BUDESONIDE-FORMOTEROL (SYMBICORT) 160-4.5 MCG/ACT INHALER    Inhale 2 puffs into the lungs 2 (two) times daily.   DEXTROMETHORPHAN-GUAIFENESIN (MUCINEX DM) 30-600 MG PER 12 HR TABLET    Take 1 tablet by mouth 2 (two) times daily.   FLUTICASONE (FLONASE) 50 MCG/ACT NASAL SPRAY    Place 1 spray into both nostrils 2 (two) times daily.   FUROSEMIDE (LASIX) 40 MG TABLET    Take 0.5-1 tablets (20-40 mg total) by mouth daily. Take 40 mg in the AM and 20 mg in the PM   GUAIFENESIN (MUCINEX) 600 MG 12 HR TABLET    Take 2 tablets (1,200 mg total) by mouth 2 (two) times daily.   HYDROCODONE-ACETAMINOPHEN (NORCO/VICODIN) 5-325 MG TABLET    Take 1 tablet by mouth every 6 (six) hours as needed for moderate pain.   IRBESARTAN (AVAPRO) 150 MG TABLET    Take 1 tablet (150 mg total) by mouth daily.   METFORMIN (GLUCOPHAGE) 500 MG TABLET    TAKE 1 TABLET BY MOUTH TWICE DAILY   MULTIPLE VITAMIN  (MULTIVITAMIN) TABLET    Take 1 tablet by mouth daily.   PANTOPRAZOLE (PROTONIX) 40 MG TABLET    TAKE 1 TABLET BY MOUTH EVERY NIGHT   POTASSIUM CHLORIDE SA (K-DUR,KLOR-CON) 20 MEQ TABLET    Take 1 tablet (20 mEq total) by mouth daily. Take while on lasix   PREDNISONE (DELTASONE) 10 MG TABLET    3 tabs daily for 5 days, then 2 tabs daily for 5 days, then 1 tab daily for 5 days, then 1/2 tab daily   TIOTROPIUM (SPIRIVA) 18 MCG INHALATION CAPSULE    Place 1 capsule (18 mcg total) into inhaler and inhale daily.  Modified Medications   No medications on file  Discontinued Medications   No medications on file     SIGNIFICANT DIAGNOSTIC EXAMS   11-26-14: chest x-ray: COPD. Mild cephalization of the pulmonary vascularity may reflect low-grade CHF. There is no alveolar pneumonia. A follow-up PA and lateral chest x-ray would be useful.  12-01-15: kub: 1. No acute finding.  No evidence of bowel obstruction. 2. Moderate increased stool in the colon.   12-02-14: chest x-ray: Probable trace pleural effusions. COPD.  12-11-14: chest x-ray: COPD Possible small area of infiltrate or pleural fluid in the posterior lung base    LABS REVIEWED:   11-26-14: wbc 15.5; hgb 10.9; hct 35.9; mcv 88.9; plt 244; glucose 160; bun 14; creat 0.55; k+ 3.6; na++142; hgb a1c 6.3 11-27-14: wbc 13.1; hgb 10.2; hct 34.4; mcv 90.8; plt 229; glucose 169; bun 13; creat 0.51; k+ 4.1; na++140; liver normal albumin 3.1 11-29-14; wbc 19.9; hgb 11.0; hct 37.5; mcv 90.8; plt 252; glucose 121; bun 24; creat 0.58; k+ 4.2; na++142 12-01-14: wbc 29.2; hgb 12.2; hct 43.2; mcv 93.5; plt 252; glucose 119; bun 34; creat 0.77; k+ 4.5; na++145; liver normal albumin 3.5; mag 1.8 12-06-14: wbc 21.5; hgb 11.3; hct 38.0; mcv 88.4; plt 238; glucose 100; bun 37; creat 0.72; k+ 3.3; na++145 12-12-14: wbc 19.0; hgb 9.7; hct 32.2; mcv 89.0; plt 272; glucose 93; bun 26; creat 0.70; k+ 4.1; na++142     Review of Systems  Constitutional: Negative for  malaise/fatigue.  Respiratory: Positive for cough, shortness of breath and wheezing.   Cardiovascular: Negative for chest pain and palpitations.  Gastrointestinal: Negative for heartburn, abdominal pain and constipation.  Musculoskeletal: Negative for myalgias  and joint pain.  Skin: Negative.   Psychiatric/Behavioral: The patient is not nervous/anxious.      Physical Exam  Constitutional: She is oriented to person, place, and time. No distress.  Frail   Eyes: Conjunctivae are normal.  Neck: Neck supple. No JVD present. No thyromegaly present.  Cardiovascular: Normal rate, regular rhythm and intact distal pulses.   Respiratory: No respiratory distress. She has no wheezes.  Has increased respiratory effort present Breath sounds diminished throughout lung field   GI: Soft. Bowel sounds are normal. She exhibits no distension. There is no tenderness.  Musculoskeletal: She exhibits no edema.  Able to move all extremities   Lymphadenopathy:    She has no cervical adenopathy.  Neurological: She is alert and oriented to person, place, and time.  Skin: Skin is warm and dry. She is not diaphoretic.  Psychiatric: She has a normal mood and affect.     ASSESSMENT/ PLAN:  Will discharge her to home with home health for pt/rn to evaluate and treat as indicated for strength; gait, balance and medication management and healthy lung program. Her prescriptions have been written for a 30 day supply of her medications with #30 xanax 1 mg tabs and #30 vicodin 5.325 mg tabs. She [facility] [place] [facility] at 10AM.    Time spent with patient  40  minutes >50% time spent counseling; reviewing medical record; tests; labs; and developing future plan of care     Synthia InnocentDeborah Ericberto Padget NP [facility]  Contact [phone] Monday through Friday 8am- 5pm  After hours call [phone]

## 2015-01-23 ENCOUNTER — Other Ambulatory Visit: Payer: Self-pay | Admitting: Adult Health

## 2015-02-02 ENCOUNTER — Other Ambulatory Visit: Payer: Self-pay | Admitting: Adult Health

## 2015-02-03 ENCOUNTER — Ambulatory Visit: Payer: Medicare Other | Admitting: Pulmonary Disease

## 2015-02-26 ENCOUNTER — Encounter: Payer: Self-pay | Admitting: Internal Medicine

## 2015-02-26 NOTE — Progress Notes (Signed)
Patient ID: Nicole Maxwell, female   DOB: 07/04/1945, 70 y.o.   MRN: [id number]    HISTORY AND PHYSICAL   DATE: 12/17/14  Location:  [facility]    Place of Service: SNF [phone])   Extended Emergency Contact Information Primary Emergency Contact: Kathryne Gin [place] Mobile Phone: [phone] Relation: Daughter Secondary Emergency Contact: Martyn Ehrich [place] Home Phone: [phone] Relation: Grandaughter  Advanced Directive information  FULL CODE  Chief Complaint  Patient presents with  . New Admit To SNF    HPI:  70 yo female seen today as a new admission into SNF following hospital stay for COPD exacerbation with acute/chronic respiratory failure, A/C HF, tobacco abuse, hypokalemia, protein calorie malnutrition, steroid induced hyperglycemia with hx DM, acute encephalopathy, hx Etoh abuse, anxiety and deconditioning. Rhinovirus and pseudomonas found in sputum. She is on ATC [place] O2 at home. She was tx with Bipap. Potassium repleted. PT recommended SNF placement for rehab  Nursing reports increased indigestion. She is on protonix daily. Pt c/o increased SOB, post nasal drip, sinus pressure and dry nose. She takes mucinex for cough/congestion. No f/c. No falls. No CP. Completed levaquin 11/20th  COPD - has hypoxia. Currently on prednisone taper but will require long term steroid. Also takes symbicort, spiriva, ATC albuterol neb treatments and also prn nebs. Uses [place] O2 ATC  Chronic diastolic HF - stable on lasix 40 mg daily with k+ 20 meq daily. Takes ASA daily  DM - controlled on metformin. A1c 6.3%  Dyslipidemia - stable on lipitor  GERD - takes protonix 40 mg daily   Chronic back pain - pain controlled on vicodin 5/325 mg every 6 hours as needed     Past Medical History  Diagnosis Date  . Hypertension   . Chronic back pain     "goes from the lower part of my back on down my legs"  (04/17/2014)  . Osteopenia     . PVD (peripheral vascular disease) (HCC)   . Tobacco abuse     " I am addicted"   . Vitamin D deficiency disease     03/26/2010 - 9.9ng/mL. Started on replacemnt  . Alcohol abuse, in remission quit 2004  . Neuromuscular disorder (HCC)     lumbar disc  . Anxiety     Xanax prn  . Shortness of breath     with anxiety  and activty  . Asthma   . Hyperlipidemia   . On home oxygen therapy     "2L; suppose to be 24/7" (04/17/2014)  . COPD (chronic obstructive pulmonary disease) (HCC)     cxr 04/03/2010 - hyperinflation  . Pneumonia     "3 times in the last month or so" (04/17/2014)  . Type II diabetes mellitus (HCC)   . Arthritis     "my whole body"    Past Surgical History  Procedure Laterality Date  . Lumbar laminectomy/decompression microdiscectomy  2005    L4-5/notes 03/06/2010  . Appendectomy  1960  . Tonsillectomy  1960  . Posterior lumbar fusion  2002  . Abdominal hysterectomy      "partial"  . Back surgery    . Carpal tunnel release Bilateral     CTS repair 2000 (R), 2006 (L)/notes 01/01/2014  . Cataract extraction w/ intraocular lens  implant, bilateral Bilateral 2015    Patient Care Team: [name], MD as PCP - General (Internal Medicine)  Social History   Social History  . Marital  Status: Widowed    Spouse Name: N/A  . Number of Children: 1  . Years of Education: N/A   Occupational History  . retired from [employer]    Social History Main Topics  . Smoking status: Current Every Day Smoker -- 0.25 packs/day for 56 years    Types: Cigarettes  . Smokeless tobacco: Never Used  . Alcohol Use: Yes     Comment: h/o heavy ETOH quit 2004  . Drug Use: No  . Sexual Activity: No   Other Topics Concern  . Not on file   Social History Narrative     reports that she has been smoking Cigarettes.  She has a 14 pack-year smoking history. She has never used smokeless tobacco. She reports that she drinks alcohol. She reports that she does not use illicit  drugs.  Family History  Problem Relation Age of Onset  . Heart attack Sister 50    CABG  . Breast cancer Sister   . Cancer Brother   . Heart attack Brother   . Hypertension Other     all siblings  . Anesthesia problems Neg Hx    No family status information on file.    Immunization History  Administered Date(s) Administered  . Influenza Split 10/26/2010, 10/08/2011  . Influenza, High Dose Seasonal PF 11/14/2014  . Influenza-Unspecified 11/25/2013  . Pneumococcal Polysaccharide-23 10/26/2010    Allergies  Allergen Reactions  . Brovana [Arformoterol Tartrate] Shortness Of Breath and Cough    General intolerance  . Budesonide Shortness Of Breath and Cough    General intolerance    Medications: Patient's Medications  New Prescriptions   No medications on file  Previous Medications   ACETAMINOPHEN (TYLENOL) 500 MG TABLET    Take 500-1,000 mg by mouth every 8 (eight) hours as needed (for pain).   ALBUTEROL (PROVENTIL HFA;VENTOLIN HFA) 108 (90 BASE) MCG/ACT INHALER    Inhale 2 puffs into the lungs every 4 (four) hours as needed for wheezing or shortness of breath.    ALBUTEROL (PROVENTIL) (2.5 MG/3ML) 0.083% NEBULIZER SOLUTION    Take 3 mLs (2.5 mg total) by nebulization every 4 (four) hours as needed for wheezing or shortness of breath. (may take 1 every 4 hours as needed for wheezing/shortness of breath) DX: 496   ALPRAZOLAM (XANAX) 2 MG TABLET    Take 0.5 tablets (1 mg total) by mouth 3 (three) times daily as needed for anxiety.   ASPIRIN EC 81 MG EC TABLET    Take 1 tablet (81 mg total) by mouth daily.   ATORVASTATIN (LIPITOR) 20 MG TABLET    Take 1 tablet daily   BUDESONIDE-FORMOTEROL (SYMBICORT) 160-4.5 MCG/ACT INHALER    Inhale 2 puffs into the lungs 2 (two) times daily.   DEXTROMETHORPHAN-GUAIFENESIN (MUCINEX DM) 30-600 MG PER 12 HR TABLET    Take 1 tablet by mouth 2 (two) times daily.   FLUTICASONE (FLONASE) 50 MCG/ACT NASAL SPRAY    Place 1 spray into both nostrils 2  (two) times daily.   FUROSEMIDE (LASIX) 40 MG TABLET    Take 0.5-1 tablets (20-40 mg total) by mouth daily. Take 40 mg in the AM and 20 mg in the PM   GUAIFENESIN (MUCINEX) 600 MG 12 HR TABLET    Take 2 tablets (1,200 mg total) by mouth 2 (two) times daily.   HYDROCODONE-ACETAMINOPHEN (NORCO/VICODIN) 5-325 MG TABLET    Take 1 tablet by mouth every 6 (six) hours as needed for moderate pain.   IRBESARTAN (AVAPRO) 150  MG TABLET    Take 1 tablet (150 mg total) by mouth daily.   METFORMIN (GLUCOPHAGE) 500 MG TABLET    TAKE 1 TABLET BY MOUTH TWICE DAILY   MULTIPLE VITAMIN (MULTIVITAMIN) TABLET    Take 1 tablet by mouth daily.   PANTOPRAZOLE (PROTONIX) 40 MG TABLET    TAKE 1 TABLET BY MOUTH EVERY NIGHT   POTASSIUM CHLORIDE SA (K-DUR,KLOR-CON) 20 MEQ TABLET    Take 1 tablet (20 mEq total) by mouth daily. Take while on lasix   PREDNISONE (DELTASONE) 10 MG TABLET    3 tabs daily for 5 days, then 2 tabs daily for 5 days, then 1 tab daily for 5 days, then 1/2 tab daily   TIOTROPIUM (SPIRIVA) 18 MCG INHALATION CAPSULE    Place 1 capsule (18 mcg total) into inhaler and inhale daily.  Modified Medications   No medications on file  Discontinued Medications   No medications on file    Review of Systems  HENT: Positive for postnasal drip and sinus pressure.   Respiratory: Positive for shortness of breath.   Musculoskeletal: Positive for back pain and arthralgias.  All other systems reviewed and are negative.   Filed Vitals:   12/17/14 1251  BP: 119/71  Pulse: 84  Temp: 98.1 F (36.7 C)  Weight: 190 lb (86.183 kg)  SpO2: 98%   Body mass index is 32.6 kg/(m^2).  Physical Exam  Constitutional: She is oriented to person, place, and time. She appears well-developed.  Frail appearing in NAD. No conversational dyspnea. [place] O2 intact  HENT:  Mouth/Throat: Oropharynx is clear and moist. No oropharyngeal exudate.  Eyes: Pupils are equal, round, and reactive to light. No scleral icterus.  Neck: Neck  supple. Carotid bruit is not present. No tracheal deviation present. No thyromegaly present.  Cardiovascular: Normal rate, regular rhythm, normal heart sounds and intact distal pulses.  Exam reveals no gallop and no friction rub.   No murmur heard. +1 pitting LE edema b/l. No calf TTP  Pulmonary/Chest: Effort normal. No stridor. No respiratory distress. She has wheezes (end expiratory b/l). She has no rales.  Reduced BS at base b/l.   Abdominal: Soft. Bowel sounds are normal. She exhibits no distension and no mass. There is no hepatomegaly. There is no tenderness. There is no rebound and no guarding.  Musculoskeletal: She exhibits tenderness.  Lymphadenopathy:    She has no cervical adenopathy.  Neurological: She is alert and oriented to person, place, and time.  Skin: Skin is warm and dry. No rash noted.  Psychiatric: She has a normal mood and affect. Her behavior is normal. Judgment and thought content normal.     Labs reviewed: Admission on 11/26/2014, Discharged on 12/12/2014  No results displayed because visit has over 200 results.  CBC Latest Ref Rng 12/12/2014 12/10/2014 12/08/2014  WBC 4.0 - 10.5 K/uL 19.0(H) 19.0(H) 20.5(H)  Hemoglobin 12.0 - 15.0 g/dL 4.0(J) 8.1(X) 11.2(L)  Hematocrit 36.0 - 46.0 % 32.2(L) 32.8(L) 38.0  Platelets 150 - 400 K/uL 272 258 292    CMP Latest Ref Rng 12/12/2014 12/10/2014 12/09/2014  Glucose 65 - 99 mg/dL 93 94 914(N)  BUN 6 - 20 mg/dL 82(N) 56(O) 13(Y)  Creatinine 0.44 - 1.00 mg/dL 8.65 7.84 6.96  Sodium 135 - 145 mmol/L 142 145 147(H)  Potassium 3.5 - 5.1 mmol/L 4.1 3.1(L) 2.9(L)  Chloride 101 - 111 mmol/L 105 106 105  CO2 22 - 32 mmol/L 31 35(H) 37(H)  Calcium 8.9 - 10.3 mg/dL 8.2(L) 8.0(L)  8.2(L)  Total Protein 6.5 - 8.1 g/dL - - -  Total Bilirubin 0.3 - 1.2 mg/dL - - -  Alkaline Phos 38 - 126 U/L - - -  AST 15 - 41 U/L - - -  ALT 14 - 54 U/L - - -        No results found.   Assessment/Plan   ICD-9-CM ICD-10-CM   1. Severe  chronic obstructive pulmonary disease (HCC) 496 J44.9   2. Other allergic rhinitis 477.8 J30.89   3. Chronic diastolic congestive heart failure (HCC) 428.32 I50.32    428.0    4. Diabetes mellitus type 2, noninsulin dependent (HCC) 250.00 E11.9   5. Esophageal dysmotility 530.5 K22.4   6. Essential hypertension 401.9 I10   7. Malnutrition of moderate degree (HCC) 263.0 E44.0   8. Chronic respiratory failure with hypoxia (HCC) 518.83 J96.11    799.02    9. Hyperlipidemia 272.4 E78.5     Humidified [place] O2 to improve dry nose sensation  Cont mucinex. T/c antihistamine  Cont other meds as ordered  Daily weight  Nutritional supplements as indicated  f/u with pulmonary as scheduled  PT/OT/ST as indicated  GOAL: short term rehab and d/c home when medically appropriate. Communicated with pt and nursing.  Will follow  Ziare Cryder S. Ancil Linsey  [facility] [address] [place], [place] [address] [phone] Cell (Monday-Friday 8 AM - 5 PM) [phone] After 5 PM and follow prompts

## 2015-03-15 ENCOUNTER — Inpatient Hospital Stay (HOSPITAL_COMMUNITY)
Admission: EM | Admit: 2015-03-15 | Discharge: 2015-03-25 | DRG: 190 | Disposition: A | Discharge status: Skilled Nursing Facility | Payer: Medicare Other | Attending: Internal Medicine | Admitting: Internal Medicine

## 2015-03-15 ENCOUNTER — Encounter (HOSPITAL_COMMUNITY): Payer: Self-pay | Admitting: *Deleted

## 2015-03-15 ENCOUNTER — Emergency Department (HOSPITAL_COMMUNITY): Payer: Medicare Other

## 2015-03-15 ENCOUNTER — Encounter: Payer: Self-pay | Admitting: Internal Medicine

## 2015-03-15 DIAGNOSIS — Z7984 Long term (current) use of oral hypoglycemic drugs: Secondary | ICD-10-CM

## 2015-03-15 DIAGNOSIS — E872 Acidosis: Secondary | ICD-10-CM | POA: Diagnosis present

## 2015-03-15 DIAGNOSIS — J9601 Acute respiratory failure with hypoxia: Secondary | ICD-10-CM | POA: Diagnosis present

## 2015-03-15 DIAGNOSIS — F419 Anxiety disorder, unspecified: Secondary | ICD-10-CM | POA: Diagnosis present

## 2015-03-15 DIAGNOSIS — J440 Chronic obstructive pulmonary disease with acute lower respiratory infection: Secondary | ICD-10-CM | POA: Diagnosis present

## 2015-03-15 DIAGNOSIS — R29898 Other symptoms and signs involving the musculoskeletal system: Secondary | ICD-10-CM | POA: Diagnosis not present

## 2015-03-15 DIAGNOSIS — Z720 Tobacco use: Secondary | ICD-10-CM | POA: Diagnosis not present

## 2015-03-15 DIAGNOSIS — M8580 Other specified disorders of bone density and structure, unspecified site: Secondary | ICD-10-CM | POA: Diagnosis present

## 2015-03-15 DIAGNOSIS — I739 Peripheral vascular disease, unspecified: Secondary | ICD-10-CM | POA: Diagnosis present

## 2015-03-15 DIAGNOSIS — J9602 Acute respiratory failure with hypercapnia: Secondary | ICD-10-CM | POA: Diagnosis not present

## 2015-03-15 DIAGNOSIS — Z9981 Dependence on supplemental oxygen: Secondary | ICD-10-CM | POA: Diagnosis not present

## 2015-03-15 DIAGNOSIS — R0602 Shortness of breath: Secondary | ICD-10-CM

## 2015-03-15 DIAGNOSIS — G9340 Encephalopathy, unspecified: Secondary | ICD-10-CM | POA: Diagnosis present

## 2015-03-15 DIAGNOSIS — E119 Type 2 diabetes mellitus without complications: Secondary | ICD-10-CM | POA: Diagnosis present

## 2015-03-15 DIAGNOSIS — I10 Essential (primary) hypertension: Secondary | ICD-10-CM | POA: Diagnosis present

## 2015-03-15 DIAGNOSIS — J441 Chronic obstructive pulmonary disease with (acute) exacerbation: Secondary | ICD-10-CM | POA: Diagnosis present

## 2015-03-15 DIAGNOSIS — I5032 Chronic diastolic (congestive) heart failure: Secondary | ICD-10-CM

## 2015-03-15 DIAGNOSIS — R64 Cachexia: Secondary | ICD-10-CM | POA: Diagnosis present

## 2015-03-15 DIAGNOSIS — Y95 Nosocomial condition: Secondary | ICD-10-CM | POA: Diagnosis present

## 2015-03-15 DIAGNOSIS — T380X5A Adverse effect of glucocorticoids and synthetic analogues, initial encounter: Secondary | ICD-10-CM | POA: Diagnosis present

## 2015-03-15 DIAGNOSIS — E43 Unspecified severe protein-calorie malnutrition: Secondary | ICD-10-CM | POA: Insufficient documentation

## 2015-03-15 DIAGNOSIS — Z681 Body mass index (BMI) 19 or less, adult: Secondary | ICD-10-CM

## 2015-03-15 DIAGNOSIS — J449 Chronic obstructive pulmonary disease, unspecified: Secondary | ICD-10-CM | POA: Diagnosis present

## 2015-03-15 DIAGNOSIS — J189 Pneumonia, unspecified organism: Secondary | ICD-10-CM | POA: Diagnosis present

## 2015-03-15 DIAGNOSIS — F17210 Nicotine dependence, cigarettes, uncomplicated: Secondary | ICD-10-CM | POA: Diagnosis present

## 2015-03-15 DIAGNOSIS — D72829 Elevated white blood cell count, unspecified: Secondary | ICD-10-CM | POA: Diagnosis present

## 2015-03-15 DIAGNOSIS — J9622 Acute and chronic respiratory failure with hypercapnia: Secondary | ICD-10-CM | POA: Diagnosis present

## 2015-03-15 DIAGNOSIS — J9612 Chronic respiratory failure with hypercapnia: Secondary | ICD-10-CM | POA: Diagnosis not present

## 2015-03-15 DIAGNOSIS — Z981 Arthrodesis status: Secondary | ICD-10-CM

## 2015-03-15 DIAGNOSIS — J80 Acute respiratory distress syndrome: Secondary | ICD-10-CM | POA: Diagnosis not present

## 2015-03-15 DIAGNOSIS — F17200 Nicotine dependence, unspecified, uncomplicated: Secondary | ICD-10-CM

## 2015-03-15 LAB — BASIC METABOLIC PANEL
Anion gap: 10 (ref 5–15)
BUN: 26 mg/dL — ABNORMAL HIGH (ref 6–20)
CALCIUM: 9.2 mg/dL (ref 8.9–10.3)
CO2: 38 mmol/L — AB (ref 22–32)
CREATININE: 0.72 mg/dL (ref 0.44–1.00)
Chloride: 95 mmol/L — ABNORMAL LOW (ref 101–111)
GFR calc Af Amer: >60 mL/min (ref >60)
GFR calc non Af Amer: >60 mL/min (ref >60)
GLUCOSE: 139 mg/dL — AB (ref 65–99)
Potassium: 4.9 mmol/L (ref 3.5–5.1)
Sodium: 143 mmol/L (ref 135–145)

## 2015-03-15 LAB — BLOOD GAS, ARTERIAL
ACID-BASE EXCESS: 10.7 mmol/L — AB (ref 0.0–2.0)
Bicarbonate: 39.8 mEq/L — ABNORMAL HIGH (ref 20.0–24.0)
Drawn by: 331471
O2 CONTENT: 2 L/min
O2 Saturation: 91.1 %
PATIENT TEMPERATURE: 98.6
PCO2 ART: 87.8 mmHg — AB (ref 35.0–45.0)
PO2 ART: 64.1 mmHg — AB (ref 80.0–100.0)
TCO2: 37.7 mmol/L (ref 0–100)
pH, Arterial: 7.279 — ABNORMAL LOW (ref 7.350–7.450)

## 2015-03-15 LAB — CBG MONITORING, ED: GLUCOSE-CAPILLARY: 166 mg/dL — AB (ref 65–99)

## 2015-03-15 LAB — CBC WITH DIFFERENTIAL/PLATELET
Basophils Absolute: 0 10*3/uL (ref 0.0–0.1)
Basophils Relative: 0 %
EOS PCT: 0 %
Eosinophils Absolute: 0 10*3/uL (ref 0.0–0.7)
HEMATOCRIT: 36.5 % (ref 36.0–46.0)
Hemoglobin: 10.3 g/dL — ABNORMAL LOW (ref 12.0–15.0)
LYMPHS ABS: 1.9 10*3/uL (ref 0.7–4.0)
LYMPHS PCT: 11 %
MCH: 26.1 pg (ref 26.0–34.0)
MCHC: 28.2 g/dL — ABNORMAL LOW (ref 30.0–36.0)
MCV: 92.6 fL (ref 78.0–100.0)
MONO ABS: 1.5 10*3/uL — AB (ref 0.1–1.0)
Monocytes Relative: 9 %
Neutro Abs: 13.8 10*3/uL — ABNORMAL HIGH (ref 1.7–7.7)
Neutrophils Relative %: 80 %
PLATELETS: 251 10*3/uL (ref 150–400)
RBC: 3.94 MIL/uL (ref 3.87–5.11)
RDW: 14 % (ref 11.5–15.5)
WBC: 17.2 10*3/uL — AB (ref 4.0–10.5)

## 2015-03-15 LAB — PROTIME-INR
INR: 0.86 (ref 0.00–1.49)
Prothrombin Time: 12 seconds (ref 11.6–15.2)

## 2015-03-15 LAB — BRAIN NATRIURETIC PEPTIDE: B Natriuretic Peptide: 45.7 pg/mL (ref 0.0–100.0)

## 2015-03-15 LAB — PROCALCITONIN

## 2015-03-15 LAB — I-STAT CG4 LACTIC ACID, ED: Lactic Acid, Venous: 1.04 mmol/L (ref 0.5–2.0)

## 2015-03-15 LAB — TROPONIN I: Troponin I: <0.03 ng/mL (ref <0.031)

## 2015-03-15 MED ORDER — IPRATROPIUM BROMIDE 0.02 % IN SOLN
1.0000 mg | Freq: Once | RESPIRATORY_TRACT | Status: AC
  Administered 2015-03-15: 1 mg via RESPIRATORY_TRACT
  Filled 2015-03-15: qty 5

## 2015-03-15 MED ORDER — ALBUTEROL SULFATE (2.5 MG/3ML) 0.083% IN NEBU
2.5000 mg | INHALATION_SOLUTION | RESPIRATORY_TRACT | Status: DC | PRN
  Filled 2015-03-15: qty 3

## 2015-03-15 MED ORDER — DEXTROSE 5 % IV SOLN
1.0000 g | Freq: Three times a day (TID) | INTRAVENOUS | Status: DC
  Administered 2015-03-15 – 2015-03-16 (×2): 1 g via INTRAVENOUS
  Filled 2015-03-15 (×4): qty 1

## 2015-03-15 MED ORDER — METHYLPREDNISOLONE SODIUM SUCC 40 MG IJ SOLR
40.0000 mg | Freq: Three times a day (TID) | INTRAMUSCULAR | Status: DC
  Administered 2015-03-15 – 2015-03-19 (×11): 40 mg via INTRAVENOUS
  Filled 2015-03-15 (×11): qty 1

## 2015-03-15 MED ORDER — IPRATROPIUM-ALBUTEROL 0.5-2.5 (3) MG/3ML IN SOLN
3.0000 mL | Freq: Four times a day (QID) | RESPIRATORY_TRACT | Status: DC
  Administered 2015-03-15 – 2015-03-25 (×37): 3 mL via RESPIRATORY_TRACT
  Filled 2015-03-15 (×36): qty 3

## 2015-03-15 MED ORDER — ALBUTEROL (5 MG/ML) CONTINUOUS INHALATION SOLN
10.0000 mg/h | INHALATION_SOLUTION | RESPIRATORY_TRACT | Status: DC
  Administered 2015-03-15: 10 mg/h via RESPIRATORY_TRACT
  Filled 2015-03-15: qty 20

## 2015-03-15 MED ORDER — VANCOMYCIN HCL IN DEXTROSE 1-5 GM/200ML-% IV SOLN: 1000.0000 mg | INTRAVENOUS | Status: DC

## 2015-03-15 MED ORDER — INSULIN ASPART 100 UNIT/ML ~~LOC~~ SOLN
0.0000 [IU] | SUBCUTANEOUS | Status: DC
  Administered 2015-03-16: 3 [IU] via SUBCUTANEOUS
  Administered 2015-03-16 (×2): 2 [IU] via SUBCUTANEOUS
  Administered 2015-03-17: 1 [IU] via SUBCUTANEOUS
  Administered 2015-03-17: 2 [IU] via SUBCUTANEOUS
  Administered 2015-03-17: 3 [IU] via SUBCUTANEOUS
  Administered 2015-03-17: 2 [IU] via SUBCUTANEOUS
  Administered 2015-03-18: 11 [IU] via SUBCUTANEOUS
  Administered 2015-03-18 – 2015-03-19 (×3): 3 [IU] via SUBCUTANEOUS
  Administered 2015-03-19: 2 [IU] via SUBCUTANEOUS
  Administered 2015-03-20: 3 [IU] via SUBCUTANEOUS
  Administered 2015-03-20: 2 [IU] via SUBCUTANEOUS
  Administered 2015-03-20: 3 [IU] via SUBCUTANEOUS
  Administered 2015-03-21: 2 [IU] via SUBCUTANEOUS
  Administered 2015-03-21: 3 [IU] via SUBCUTANEOUS
  Administered 2015-03-21: 2 [IU] via SUBCUTANEOUS
  Administered 2015-03-21 – 2015-03-22 (×3): 3 [IU] via SUBCUTANEOUS
  Administered 2015-03-22: 2 [IU] via SUBCUTANEOUS
  Administered 2015-03-22 – 2015-03-23 (×2): 3 [IU] via SUBCUTANEOUS
  Administered 2015-03-23 (×2): 2 [IU] via SUBCUTANEOUS
  Administered 2015-03-23: 3 [IU] via SUBCUTANEOUS
  Administered 2015-03-24 (×2): 5 [IU] via SUBCUTANEOUS
  Administered 2015-03-24: 2 [IU] via SUBCUTANEOUS
  Administered 2015-03-24: 3 [IU] via SUBCUTANEOUS
  Administered 2015-03-24: 2 [IU] via SUBCUTANEOUS

## 2015-03-15 MED ORDER — PANTOPRAZOLE SODIUM 40 MG IV SOLR
40.0000 mg | Freq: Every day | INTRAVENOUS | Status: DC
  Administered 2015-03-15 – 2015-03-16 (×2): 40 mg via INTRAVENOUS
  Filled 2015-03-15 (×2): qty 40

## 2015-03-15 MED ORDER — SODIUM CHLORIDE 0.9 % IV SOLN: 250.0000 mL | INTRAVENOUS | Status: DC | PRN

## 2015-03-15 MED ORDER — ENOXAPARIN SODIUM 30 MG/0.3ML ~~LOC~~ SOLN
30.0000 mg | SUBCUTANEOUS | Status: DC
  Administered 2015-03-15 – 2015-03-16 (×2): 30 mg via SUBCUTANEOUS
  Filled 2015-03-15 (×3): qty 0.3

## 2015-03-15 MED ORDER — DEXTROSE 5 % IV SOLN
2.0000 g | Freq: Once | INTRAVENOUS | Status: AC
  Administered 2015-03-15: 2 g via INTRAVENOUS
  Filled 2015-03-15: qty 2

## 2015-03-15 MED ORDER — VANCOMYCIN HCL IN DEXTROSE 1-5 GM/200ML-% IV SOLN
1000.0000 mg | Freq: Once | INTRAVENOUS | Status: AC
  Administered 2015-03-15: 1000 mg via INTRAVENOUS
  Filled 2015-03-15: qty 200

## 2015-03-15 MED ORDER — METHYLPREDNISOLONE SODIUM SUCC 125 MG IJ SOLR
125.0000 mg | Freq: Once | INTRAMUSCULAR | Status: AC
  Administered 2015-03-15: 125 mg via INTRAVENOUS
  Filled 2015-03-15: qty 2

## 2015-03-15 MED ORDER — ALBUTEROL SULFATE (2.5 MG/3ML) 0.083% IN NEBU
5.0000 mg | INHALATION_SOLUTION | Freq: Once | RESPIRATORY_TRACT | Status: AC
  Administered 2015-03-17: 5 mg via RESPIRATORY_TRACT

## 2015-03-15 NOTE — ED Notes (Signed)
Pt daughter asked if pt could have water. Pt daughter was advised that pt is unable to drink due to lethargy and that she is on bipap and it cannot be removed. RT reiterated the same to pt and daughter

## 2015-03-15 NOTE — ED Provider Notes (Signed)
CSN: [id number]     Arrival date & time 03/15/15  1343 History   First MD Initiated Contact with Patient 03/15/15 1406     Chief Complaint  Patient presents with  . Shortness of Breath     (Consider location/radiation/quality/duration/timing/severity/associated sxs/prior Treatment) HPI Patient's daughter reports that the patient has COPD. She had developed cough and was seen by Dr. Clelia Croft on 2/14. She reports he did not see any pneumonia and the patient was started on prednisone and Cefdinir. She reports she has gotten worse. She reports she is very short of breath now. A she is denying any chest pain. There is been no documented fever. She denies leg pain or swelling in the calves. A she has become much more weak. Past Medical History  Diagnosis Date  . Hypertension   . Chronic back pain     "goes from the lower part of my back on down my legs"  (04/17/2014)  . Osteopenia   . PVD (peripheral vascular disease) (HCC)   . Tobacco abuse     " I am addicted"   . Vitamin D deficiency disease     03/26/2010 - 9.9ng/mL. Started on replacemnt  . Alcohol abuse, in remission quit 2004  . Neuromuscular disorder (HCC)     lumbar disc  . Anxiety     Xanax prn  . Shortness of breath     with anxiety  and activty  . Asthma   . Hyperlipidemia   . On home oxygen therapy     "2L; suppose to be 24/7" (04/17/2014)  . COPD (chronic obstructive pulmonary disease) (HCC)     cxr 04/03/2010 - hyperinflation  . Pneumonia     "3 times in the last month or so" (04/17/2014)  . Type II diabetes mellitus (HCC)   . Arthritis     "my whole body"   Past Surgical History  Procedure Laterality Date  . Lumbar laminectomy/decompression microdiscectomy  2005    L4-5/notes 03/06/2010  . Appendectomy  1960  . Tonsillectomy  1960  . Posterior lumbar fusion  2002  . Abdominal hysterectomy      "partial"  . Back surgery    . Carpal tunnel release Bilateral     CTS repair 2000 (R), 2006 (L)/notes 01/01/2014  .  Cataract extraction w/ intraocular lens  implant, bilateral Bilateral 2015   Family History  Problem Relation Age of Onset  . Heart attack Sister 92    CABG  . Breast cancer Sister   . Cancer Brother   . Heart attack Brother   . Hypertension Other     all siblings  . Anesthesia problems Neg Hx    Social History  Substance Use Topics  . Smoking status: Current Every Day Smoker -- 0.25 packs/day for 56 years    Types: Cigarettes  . Smokeless tobacco: Never Used  . Alcohol Use: Yes     Comment: h/o heavy ETOH quit 2004   OB History    No data available     Review of Systems  10 Systems reviewed and are negative for acute change except as noted in the HPI.   Allergies  Brovana and Budesonide  Home Medications   Prior to Admission medications   Medication Sig Start Date End Date Taking? Authorizing Provider  acetaminophen (TYLENOL) 500 MG tablet Take 500-1,000 mg by mouth every 8 (eight) hours as needed (for pain).   Yes Historical Provider, MD  albuterol (PROVENTIL HFA;VENTOLIN HFA) 108 (90 BASE) MCG/ACT inhaler  Inhale 2 puffs into the lungs every 4 (four) hours as needed for wheezing or shortness of breath.    Yes Historical Provider, MD  albuterol (PROVENTIL) (2.5 MG/3ML) 0.083% nebulizer solution Take 3 mLs (2.5 mg total) by nebulization every 4 (four) hours as needed for wheezing or shortness of breath. (may take 1 every 4 hours as needed for wheezing/shortness of breath) DX: 496 Patient taking differently: Take 2.5 mg by nebulization 4 (four) times daily. Or may take 1 treatment every 6 hours as needed for wheezing/shortness of breath 01/09/14  Yes [name], MD  alprazolam Prudy Feeler) 2 MG tablet Take 0.5 tablets (1 mg total) by mouth 3 (three) times daily as needed for anxiety. 12/12/14  Yes Jeanella Craze, NP  aspirin EC 81 MG EC tablet Take 1 tablet (81 mg total) by mouth daily. 01/09/14  Yes [name], MD  atorvastatin (LIPITOR) 20 MG tablet Take 20 mg by mouth  daily. Take 1 tablet daily 09/14/14  Yes Historical Provider, MD  budesonide-formoterol (SYMBICORT) 160-4.5 MCG/ACT inhaler Inhale 2 puffs into the lungs 2 (two) times daily. 04/18/14  Yes Dorothea Ogle, MD  cefdinir (OMNICEF) 300 MG capsule Take 300 mg by mouth 2 (two) times daily.   Yes Historical Provider, MD  dextromethorphan-guaiFENesin (MUCINEX DM) 30-600 MG per 12 hr tablet Take 1 tablet by mouth 2 (two) times daily. 01/09/14  Yes [name], MD  fluticasone (FLONASE) 50 MCG/ACT nasal spray Place 1 spray into both nostrils 2 (two) times daily. Patient taking differently: Place 1 spray into both nostrils 2 (two) times daily as needed for allergies.  12/23/14  Yes Sharee Holster, NP  furosemide (LASIX) 40 MG tablet Take 0.5-1 tablets (20-40 mg total) by mouth daily. Take 40 mg in the AM and 20 mg in the PM Patient taking differently: Take 40 mg by mouth daily. Take 40 mg in the AM and 20 mg in the PM 12/23/14  Yes Sharee Holster, NP  HYDROcodone-acetaminophen (NORCO/VICODIN) 5-325 MG tablet Take 1 tablet by mouth every 6 (six) hours as needed for moderate pain. 12/12/14  Yes Jeanella Craze, NP  irbesartan (AVAPRO) 150 MG tablet Take 1 tablet (150 mg total) by mouth daily. 01/09/14  Yes [name], MD  metFORMIN (GLUCOPHAGE) 500 MG tablet TAKE 1 TABLET BY MOUTH TWICE DAILY 01/07/15  Yes Sharee Holster, NP  Multiple Vitamin (MULTIVITAMIN) tablet Take 1 tablet by mouth daily.   Yes Historical Provider, MD  pantoprazole (PROTONIX) 40 MG tablet TAKE 1 TABLET BY MOUTH EVERY NIGHT 01/07/15  Yes Sharee Holster, NP  potassium chloride SA (K-DUR,KLOR-CON) 20 MEQ tablet Take 1 tablet (20 mEq total) by mouth daily. Take while on lasix 12/12/14  Yes Jeanella Craze, NP  predniSONE (DELTASONE) 10 MG tablet Take 10-30 mg by mouth daily with breakfast. Take 20mg  by mouth /BID for 3 days, then take 10mg  BID for 3 days, then take 10mg  qd for 3 days then resume 5 mg qd   Yes Historical Provider, MD  predniSONE  (DELTASONE) 5 MG tablet Take 5 mg by mouth daily with breakfast.   Yes Historical Provider, MD  tiotropium (SPIRIVA) 18 MCG inhalation capsule Place 1 capsule (18 mcg total) into inhaler and inhale daily. 04/09/12  Yes Jarome Matin, MD  guaiFENesin (MUCINEX) 600 MG 12 hr tablet Take 2 tablets (1,200 mg total) by mouth 2 (two) times daily. Patient not taking: Reported on 03/15/2015 12/12/14   Jeanella Craze, NP  predniSONE (DELTASONE)  10 MG tablet 3 tabs daily for 5 days, then 2 tabs daily for 5 days, then 1 tab daily for 5 days, then 1/2 tab daily Patient not taking: Reported on 03/15/2015 12/12/14   Jeanella Craze, NP   BP 127/62 mmHg  Pulse 106  Temp(Src) 98.4 F (36.9 C) (Oral)  Resp 21  SpO2 98% Physical Exam  Constitutional:  Patient is very thin. She has moderate increased work of breathing. A she is nontoxic but appears slightly somnolent.  HENT:  Head: Normocephalic and atraumatic.  Mouth/Throat: Oropharynx is clear and moist.  Eyes: EOM are normal. Pupils are equal, round, and reactive to light.  Neck: Neck supple.  Cardiovascular: Normal rate, regular rhythm, normal heart sounds and intact distal pulses.   Pulmonary/Chest:  Moderate respiratory distress. Coarse wheezes throughout the lung fields.  Abdominal: Soft. Bowel sounds are normal. She exhibits no distension. There is no tenderness.  Musculoskeletal: Normal range of motion. She exhibits no edema or tenderness.  Neurological: She is alert. She has normal strength. She exhibits normal muscle tone. Coordination normal. GCS eye subscore is 4. GCS verbal subscore is 5. GCS motor subscore is 6.  Skin: Skin is warm, dry and intact.  Psychiatric: She has a normal mood and affect.    ED Course  Procedures (including critical care time) CRITICAL CARE Performed by: Arby Barrette   Total critical care time: 30 minutes  Critical care time was exclusive of separately billable procedures and treating other  patients.  Critical care was necessary to treat or prevent imminent or life-threatening deterioration.  Critical care was time spent personally by me on the following activities: development of treatment plan with patient and/or surrogate as well as nursing, discussions with consultants, evaluation of patient's response to treatment, examination of patient, obtaining history from patient or surrogate, ordering and performing treatments and interventions, ordering and review of laboratory studies, ordering and review of radiographic studies, pulse oximetry and re-evaluation of patient's condition. Labs Review Labs Reviewed  BLOOD GAS, ARTERIAL - Abnormal; Notable for the following:    pH, Arterial 7.279 (*)    pCO2 arterial 87.8 (*)    pO2, Arterial 64.1 (*)    Bicarbonate 39.8 (*)    Acid-Base Excess 10.7 (*)    All other components within normal limits  BASIC METABOLIC PANEL - Abnormal; Notable for the following:    Chloride 95 (*)    CO2 38 (*)    Glucose, Bld 139 (*)    BUN 26 (*)    All other components within normal limits  CBC WITH DIFFERENTIAL/PLATELET - Abnormal; Notable for the following:    WBC 17.2 (*)    Hemoglobin 10.3 (*)    MCHC 28.2 (*)    Neutro Abs 13.8 (*)    Monocytes Absolute 1.5 (*)    All other components within normal limits  CULTURE, BLOOD (ROUTINE X 2)  CULTURE, BLOOD (ROUTINE X 2)  BRAIN NATRIURETIC PEPTIDE  TROPONIN I  PROTIME-INR  URINALYSIS, ROUTINE W REFLEX MICROSCOPIC (NOT AT [facility])  I-STAT CG4 LACTIC ACID, ED    Imaging Review Dg Chest 1 View  03/15/2015  CLINICAL DATA:  Short of breath today EXAM: CHEST 1 VIEW COMPARISON:  12/11/2014 FINDINGS: Left basilar airspace disease has increased. Hyperaeration. Normal heart size. No pneumothorax. No pleural effusion. IMPRESSION: Left basilar airspace disease has increased. This likely represents acute inflammatory disease upon chronic changes. Followup PA and lateral chest X-ray is recommended in 3-4  weeks following trial of antibiotic  therapy to ensure resolution and exclude underlying malignancy. Electronically Signed   By: Jolaine Click M.D.   On: 03/15/2015 14:27   I have personally reviewed and evaluated these images and lab results as part of my medical decision-making.   EKG Interpretation   Date/Time:  Saturday March 15 2015 13:51:56 EST Ventricular Rate:  100 PR Interval:  122 QRS Duration: 70 QT Interval:  326 QTC Calculation: 420 R Axis:   54 Text Interpretation:  Sinus tachycardia Right atrial enlargement Anterior  infarct, old Confirmed by Donnald Garre, MD, Lebron Conners [phone]) on 03/15/2015 2:23:30  PM      Consult:(16:15) Patient's case was reviewed with intensivist Dr. Vassie Loll. Patient will be assessed in the emergency department for ICU admission. Recheck: 16:20 patient is tolerating the BiPAP. Blood pressures are remaining stable. Patient awakens to light stimulus. MDM   Final diagnoses:  COPD exacerbation (HCC)  Acute on chronic respiratory failure with hypercapnia [facility])   Patient has advanced COPD. She started treatment for days ago with prednisone and antibiotics. Has been worsening. At this time she presents with significant diffuse wheezing and hypercapnia. Patient has been placed on BiPAP. Solu-Medrol administered. She has had continuous albuterol and repeat ipratropium treatment. Patient is maintaining her airway on BiPAP. She is drowsy but awakens to stimulus. Intensivist has been consult it.     Arby Barrette, MD 03/15/15 [phone]

## 2015-03-15 NOTE — ED Notes (Signed)
Called RT to reassess pts bipap

## 2015-03-15 NOTE — ED Notes (Signed)
Bed: RESA Expected date: 03/15/15 Expected time: 1:47 PM Means of arrival: Ambulance Comments: Shob, COPD

## 2015-03-15 NOTE — Progress Notes (Signed)
Rt placed pt on BIPAP in ED per MD verbal order due to ABG.

## 2015-03-15 NOTE — H&P (Signed)
PULMONARY / CRITICAL CARE MEDICINE   Name: Nicole Maxwell MRN: [id number] DOB: May 11, 1945    ADMISSION DATE:  03/15/2015   CHIEF COMPLAINT:  Respiratory distress  HISTORY OF PRESENT ILLNESS:   This is the second admission in the last 3 months for this 70 year old smoker with severe COPD followed by my partner Dr. Marchelle Gearing. Spirometry in January 2013 showed FEV1 of 0.74/37%. She is maintained on a regimen of Symbicort and Spiriva as outpatient. She was hospitalized 11/2014 with acute exacerbation of COPD in the setting of rhinovirus and pansensitive pseudomonas. She required mechanical ventilation, she was discharged to a rehabilitation facility and quit smoking but relapsed after getting back home. She was also started on Lasix for pedal edema. She saw her PCP last week and was given Cefdinir and prednisone However she became increasingly lethargic and in respiratory distress and daughter brought her in, noted to have respiratory acidosis on ABG and placed on BiPAP in the ED  PAST MEDICAL HISTORY :  She  has a past medical history of Hypertension; Chronic back pain; Osteopenia; PVD (peripheral vascular disease) (HCC); Tobacco abuse; Vitamin D deficiency disease; Alcohol abuse, in remission (quit 2004); Neuromuscular disorder (HCC); Anxiety; Shortness of breath; Asthma; Hyperlipidemia; On home oxygen therapy; COPD (chronic obstructive pulmonary disease) (HCC); Pneumonia; Type II diabetes mellitus (HCC); and Arthritis.  PAST SURGICAL HISTORY: She  has past surgical history that includes Lumbar laminectomy/decompression microdiscectomy (2005); Appendectomy (1960); Tonsillectomy (1960); Posterior lumbar fusion (2002); Abdominal hysterectomy; Back surgery; Carpal tunnel release (Bilateral); and Cataract extraction w/ intraocular lens  implant, bilateral (Bilateral, 2015).  Allergies  Allergen Reactions  . Brovana [Arformoterol Tartrate] Shortness Of Breath and Cough    General intolerance  .  Budesonide Shortness Of Breath and Cough    General intolerance    No current facility-administered medications on file prior to encounter.   Current Outpatient Prescriptions on File Prior to Encounter  Medication Sig  . acetaminophen (TYLENOL) 500 MG tablet Take 500-1,000 mg by mouth every 8 (eight) hours as needed (for pain).  Marland Kitchen albuterol (PROVENTIL HFA;VENTOLIN HFA) 108 (90 BASE) MCG/ACT inhaler Inhale 2 puffs into the lungs every 4 (four) hours as needed for wheezing or shortness of breath.   Marland Kitchen albuterol (PROVENTIL) (2.5 MG/3ML) 0.083% nebulizer solution Take 3 mLs (2.5 mg total) by nebulization every 4 (four) hours as needed for wheezing or shortness of breath. (may take 1 every 4 hours as needed for wheezing/shortness of breath) DX: 496 (Patient taking differently: Take 2.5 mg by nebulization 4 (four) times daily. Or may take 1 treatment every 6 hours as needed for wheezing/shortness of breath)  . alprazolam (XANAX) 2 MG tablet Take 0.5 tablets (1 mg total) by mouth 3 (three) times daily as needed for anxiety.  Marland Kitchen aspirin EC 81 MG EC tablet Take 1 tablet (81 mg total) by mouth daily.  Marland Kitchen atorvastatin (LIPITOR) 20 MG tablet Take 20 mg by mouth daily. Take 1 tablet daily  . budesonide-formoterol (SYMBICORT) 160-4.5 MCG/ACT inhaler Inhale 2 puffs into the lungs 2 (two) times daily.  Marland Kitchen dextromethorphan-guaiFENesin (MUCINEX DM) 30-600 MG per 12 hr tablet Take 1 tablet by mouth 2 (two) times daily.  . fluticasone (FLONASE) 50 MCG/ACT nasal spray Place 1 spray into both nostrils 2 (two) times daily. (Patient taking differently: Place 1 spray into both nostrils 2 (two) times daily as needed for allergies. )  . furosemide (LASIX) 40 MG tablet Take 0.5-1 tablets (20-40 mg total) by mouth daily. Take 40 mg in the  AM and 20 mg in the PM (Patient taking differently: Take 40 mg by mouth daily. Take 40 mg in the AM and 20 mg in the PM)  . HYDROcodone-acetaminophen (NORCO/VICODIN) 5-325 MG tablet Take 1 tablet  by mouth every 6 (six) hours as needed for moderate pain.  Marland Kitchen irbesartan (AVAPRO) 150 MG tablet Take 1 tablet (150 mg total) by mouth daily.  . metFORMIN (GLUCOPHAGE) 500 MG tablet TAKE 1 TABLET BY MOUTH TWICE DAILY  . Multiple Vitamin (MULTIVITAMIN) tablet Take 1 tablet by mouth daily.  . pantoprazole (PROTONIX) 40 MG tablet TAKE 1 TABLET BY MOUTH EVERY NIGHT  . potassium chloride SA (K-DUR,KLOR-CON) 20 MEQ tablet Take 1 tablet (20 mEq total) by mouth daily. Take while on lasix  . tiotropium (SPIRIVA) 18 MCG inhalation capsule Place 1 capsule (18 mcg total) into inhaler and inhale daily.  Marland Kitchen guaiFENesin (MUCINEX) 600 MG 12 hr tablet Take 2 tablets (1,200 mg total) by mouth 2 (two) times daily. (Patient not taking: Reported on 03/15/2015)  . predniSONE (DELTASONE) 10 MG tablet 3 tabs daily for 5 days, then 2 tabs daily for 5 days, then 1 tab daily for 5 days, then 1/2 tab daily (Patient not taking: Reported on 03/15/2015)    FAMILY HISTORY:  Her has no family status information on file.   SOCIAL HISTORY: She  reports that she has been smoking Cigarettes.  She has a 14 pack-year smoking history. She has never used smokeless tobacco. She reports that she drinks alcohol. She reports that she does not use illicit drugs.  REVIEW OF SYSTEMS:   Unable to obtain since on BiPAP Daughter reports dry cough, increased dyspnea No pedal edema, chest pain or palpitations No fevers No preceding URI symptoms No sick contacts  SUBJECTIVE:    VITAL SIGNS: BP 127/62 mmHg  Pulse 106  Temp(Src) 98.4 F (36.9 C) (Oral)  Resp 21  Ht 5\' 5"  (1.651 m)  Wt 51.256 kg (113 lb)  BMI 18.80 kg/m2  SpO2 98%  HEMODYNAMICS:    VENTILATOR SETTINGS: Vent Mode:  [-] BIPAP FiO2 (%):  [30 %] 30 % Set Rate:  [15 bmp] 15 bmp PEEP:  [5 cmH20] 5 cmH20  INTAKE / OUTPUT:    PHYSICAL EXAMINATION: Gen. Pleasant, thin and cachectic, in mild distress, anxious affect, on BiPAP fullface mask ENT - no lesions, no post  nasal drip, no JVD Neck: No JVD, no thyromegaly, no carotid bruits Lungs: no use of accessory muscles, no dullness to percussion, decreased without rales or rhonchi  Cardiovascular: Rhythm regular, heart sounds  normal, no murmurs or gallops, no peripheral edema Abdomen: soft and non-tender, no hepatosplenomegaly, BS normal. Musculoskeletal: No deformities, no cyanosis or clubbing Neuro:  alert, non focal, no asterixis   LABS:  BMET  Recent Labs Lab 03/15/15 1430  NA 143  K 4.9  CL 95*  CO2 38*  BUN 26*  CREATININE 0.72  GLUCOSE 139*    Electrolytes  Recent Labs Lab 03/15/15 1430  CALCIUM 9.2    CBC  Recent Labs Lab 03/15/15 1430  WBC 17.2*  HGB 10.3*  HCT 36.5  PLT 251    Coag's  Recent Labs Lab 03/15/15 1430  INR 0.86    Sepsis Markers  Recent Labs Lab 03/15/15 1504  LATICACIDVEN 1.04    ABG  Recent Labs Lab 03/15/15 1446  PHART 7.279*  PCO2ART 87.8*  PO2ART 64.1*    Liver Enzymes No results for input(s): AST, ALT, ALKPHOS, BILITOT, ALBUMIN in the last 168  hours.  Cardiac Enzymes  Recent Labs Lab 03/15/15 1430  TROPONINI <0.03    Glucose No results for input(s): GLUCAP in the last 168 hours.  Imaging Dg Chest 1 View  03/15/2015  CLINICAL DATA:  Short of breath today EXAM: CHEST 1 VIEW COMPARISON:  12/11/2014 FINDINGS: Left basilar airspace disease has increased. Hyperaeration. Normal heart size. No pneumothorax. No pleural effusion. IMPRESSION: Left basilar airspace disease has increased. This likely represents acute inflammatory disease upon chronic changes. Followup PA and lateral chest X-ray is recommended in 3-4 weeks following trial of antibiotic therapy to ensure resolution and exclude underlying malignancy. Electronically Signed   By: Jolaine Click M.D.   On: 03/15/2015 14:27     STUDIES:  Chest x-ray reviewed shows left lower lobe infiltrate, similar to that seen in 11/2014  CULTURES: 2/18 blood  >>  ANTIBIOTICS: 2/18   Vancomycin 2/18 cefepime   DISCUSSION: She seems to have another episode of respiratory failure, likely related to relapse of her smoking. She has a faint left lower lobe infiltrate which was also present in 11/2014-I'm not convinced this is a new pneumonia but we'll treat her as such given critical illness  ASSESSMENT / PLAN:  PULMONARY A: Acute hypercarbic respiratory failure Acute exacerbation of COPD P:   BiPAP continuous Solu-Medrol 40 every 8 Listed allergy to brovana- hence we'll only use DuoNeb every 6 and when necessary She may be a candidate for home noninvasive ventilation (Trilogy) if we are to pursue all measures  CARDIOVASCULAR A:  No issues P:  Continue aspirin when able to take by mouth  RENAL A:   No issues P:     GASTROINTESTINAL A:   No issues P:   Nothing by mouth while on BiPAP Protonix  HEMATOLOGIC A:   Leukocytosis related to steroids P:  Monitor  INFECTIOUS A:   Left lower lobe HCAP P:   Empiric Vanco/cefepime Check pro calcitonin and if low-can DC antibiotics  ENDOCRINE A:   Hyperglycemia steroids   P:   SSI  NEUROLOGIC A:   Acute encephalopathy-related to hypercarbia P:    Monitor, repeat ABG in mental status worsens   FAMILY  - Updates: Daughter at the bedside, discussed that repeat hospitalization is a poor prognostic sign, she does request a full code  - Inter-disciplinary family meet or Palliative Care meeting due by:  day 7, consider palliative care consult  The patient is critically ill with multiple organ systems failure and requires high complexity decision making for assessment and support, frequent evaluation and titration of therapies, application of advanced monitoring technologies and extensive interpretation of multiple databases. Critical Care Time devoted to patient care services described in this note independent of APP time is 45 minutes.    Cyril Mourning MD. Tonny Bollman. [facility] Pager [phone] If no response call [phone]    03/15/2015, 5:55 PM

## 2015-03-15 NOTE — ED Notes (Signed)
RT called to admin cont neb 

## 2015-03-15 NOTE — Progress Notes (Signed)
Pharmacy Antibiotic Note  Nicole Maxwell is a 70 y.o. female admitted on 03/15/2015 with pneumonia.  Pharmacy has been consulted for Vancomycin & Cefepime dosing. She has been on cefdinir as an outpatient for COPD exacerbation.   Plan: Vancomycin  IV q24h Cefepime 1gm IV q8h Check Vancomycin trough at steady state Monitor renal function and cx data      Temp (24hrs), Avg:98.4 F (36.9 C), Min:98.4 F (36.9 C), Max:98.4 F (36.9 C)   Recent Labs Lab 03/15/15 1430 03/15/15 1504  WBC 17.2*  --   CREATININE 0.72  --   LATICACIDVEN  --  1.04    CrCl cannot be calculated (Unknown ideal weight.).    Allergies  Allergen Reactions  . Brovana [Arformoterol Tartrate] Shortness Of Breath and Cough    General intolerance  . Budesonide Shortness Of Breath and Cough    General intolerance    Antimicrobials this admission: Cefepime 2/18 >>  Vancomycin 2/18 >>   Dose adjustments this admission:  Microbiology results: 2/18 BCx: sent  Thank you for allowing pharmacy to be a part of this patient's care.  Nicole Maxwell 03/15/2015 3:40 PM

## 2015-03-15 NOTE — ED Notes (Signed)
Pt was started on Antibiotics for "COPD" according to family, today they become concerned due to weakness and shob. Pt on second neb treatment, wheezing continues  168/66  90  16  100% on neb treatment.

## 2015-03-16 ENCOUNTER — Inpatient Hospital Stay (HOSPITAL_COMMUNITY): Payer: Medicare Other

## 2015-03-16 LAB — URINALYSIS, ROUTINE W REFLEX MICROSCOPIC
Bilirubin Urine: NEGATIVE
GLUCOSE, UA: NEGATIVE mg/dL
Hgb urine dipstick: NEGATIVE
Ketones, ur: NEGATIVE mg/dL
LEUKOCYTES UA: NEGATIVE
Nitrite: NEGATIVE
PROTEIN: 30 mg/dL — AB
SPECIFIC GRAVITY, URINE: 1.02 (ref 1.005–1.030)
pH: 5.5 (ref 5.0–8.0)

## 2015-03-16 LAB — GLUCOSE, CAPILLARY
GLUCOSE-CAPILLARY: 171 mg/dL — AB (ref 65–99)
GLUCOSE-CAPILLARY: 165 mg/dL — AB (ref 65–99)
GLUCOSE-CAPILLARY: 130 mg/dL — AB (ref 65–99)
Glucose-Capillary: 98 mg/dL (ref 65–99)
Glucose-Capillary: 111 mg/dL — ABNORMAL HIGH (ref 65–99)
Glucose-Capillary: 138 mg/dL — ABNORMAL HIGH (ref 65–99)

## 2015-03-16 LAB — CBC
HCT: 34.6 % — ABNORMAL LOW (ref 36.0–46.0)
HEMOGLOBIN: 9.9 g/dL — AB (ref 12.0–15.0)
MCH: 25.7 pg — ABNORMAL LOW (ref 26.0–34.0)
MCHC: 28.6 g/dL — AB (ref 30.0–36.0)
MCV: 89.9 fL (ref 78.0–100.0)
Platelets: 269 10*3/uL (ref 150–400)
RBC: 3.85 MIL/uL — ABNORMAL LOW (ref 3.87–5.11)
RDW: 14 % (ref 11.5–15.5)
WBC: 17.3 10*3/uL — AB (ref 4.0–10.5)

## 2015-03-16 LAB — URINE MICROSCOPIC-ADD ON

## 2015-03-16 LAB — BASIC METABOLIC PANEL
ANION GAP: 10 (ref 5–15)
BUN: 34 mg/dL — ABNORMAL HIGH (ref 6–20)
CALCIUM: 8.8 mg/dL — AB (ref 8.9–10.3)
CHLORIDE: 94 mmol/L — AB (ref 101–111)
CO2: 35 mmol/L — ABNORMAL HIGH (ref 22–32)
CREATININE: 0.77 mg/dL (ref 0.44–1.00)
GFR calc non Af Amer: >60 mL/min (ref >60)
Glucose, Bld: 154 mg/dL — ABNORMAL HIGH (ref 65–99)
Potassium: 5.1 mmol/L (ref 3.5–5.1)
SODIUM: 139 mmol/L (ref 135–145)

## 2015-03-16 LAB — PHOSPHORUS: PHOSPHORUS: 3.1 mg/dL (ref 2.5–4.6)

## 2015-03-16 LAB — MRSA PCR SCREENING: MRSA BY PCR: INVALID — AB

## 2015-03-16 LAB — MAGNESIUM
MAGNESIUM: 1.5 mg/dL — AB (ref 1.7–2.4)
MAGNESIUM: 3.8 mg/dL — AB (ref 1.7–2.4)

## 2015-03-16 MED ORDER — AZITHROMYCIN 250 MG PO TABS
250.0000 mg | ORAL_TABLET | Freq: Every day | ORAL | Status: AC
  Administered 2015-03-16 – 2015-03-20 (×5): 250 mg via ORAL
  Filled 2015-03-16 (×5): qty 1

## 2015-03-16 MED ORDER — ASPIRIN EC 81 MG PO TBEC
81.0000 mg | DELAYED_RELEASE_TABLET | Freq: Every day | ORAL | Status: DC
  Administered 2015-03-16 – 2015-03-25 (×10): 81 mg via ORAL
  Filled 2015-03-16 (×10): qty 1

## 2015-03-16 MED ORDER — GI COCKTAIL ~~LOC~~
30.0000 mL | Freq: Once | ORAL | Status: AC
  Administered 2015-03-16: 30 mL via ORAL
  Filled 2015-03-16: qty 30

## 2015-03-16 MED ORDER — SODIUM CHLORIDE 0.9 % IV SOLN
6.0000 g | Freq: Once | INTRAVENOUS | Status: AC
  Administered 2015-03-16: 6 g via INTRAVENOUS
  Filled 2015-03-16: qty 10

## 2015-03-16 MED ORDER — FLUTICASONE PROPIONATE 50 MCG/ACT NA SUSP
1.0000 | Freq: Two times a day (BID) | NASAL | Status: DC
  Administered 2015-03-16 – 2015-03-25 (×18): 1 via NASAL
  Filled 2015-03-16 (×2): qty 16

## 2015-03-16 MED ORDER — CHLORHEXIDINE GLUCONATE 0.12 % MT SOLN
15.0000 mL | Freq: Two times a day (BID) | OROMUCOSAL | Status: DC
  Administered 2015-03-16 – 2015-03-20 (×9): 15 mL via OROMUCOSAL
  Filled 2015-03-16 (×9): qty 15

## 2015-03-16 NOTE — Progress Notes (Signed)
Pt removed from BIPAP and placed on 2 LPM [place].  Pt tolerating well at this time, no noted distress.  Pt alert and oriented, RT will monitor and assess as needed.

## 2015-03-16 NOTE — Progress Notes (Signed)
Patients daughter stated her mom wseemed more confused than earlier in the day and requested BiPap. Respiratory called to assess need for Bipap. O2 sats remain stable.

## 2015-03-16 NOTE — Progress Notes (Signed)
ELink called to notify of increasing confusion and agitation on BiPap. Vinnie Langton RN to consult with MD

## 2015-03-16 NOTE — Progress Notes (Signed)
Utilization review completed.  

## 2015-03-16 NOTE — Progress Notes (Signed)
eLink Physician-Brief Progress Note Patient Name: Nicole Maxwell DOB: [birth year] MRN: [id number]   Date of Service  03/16/2015  HPI/Events of Note  Patient c/o of epigastic pain.  eICU Interventions  One time dose of GI cocktail po     Intervention Category Intermediate Interventions: Pain - evaluation and management  Makenze Ellett 03/16/2015, 12:55 AM

## 2015-03-16 NOTE — Progress Notes (Signed)
PULMONARY / CRITICAL CARE MEDICINE   Name: Nicole Maxwell MRN: [id number] DOB: 04/11/1945    ADMISSION DATE:  03/15/2015   CHIEF COMPLAINT:  Respiratory distress  HISTORY OF PRESENT ILLNESS:   This is the second admission in the last 3 months for this 70 year old smoker with severe COPD followed by my partner Dr. Marchelle Gearing. Spirometry in January 2013 showed FEV1 of 0.74/37%. She is maintained on a regimen of Symbicort and Spiriva as outpatient. She was hospitalized 11/2014 with acute exacerbation of COPD in the setting of rhinovirus and pansensitive pseudomonas. She required mechanical ventilation, she was discharged to a rehabilitation facility and quit smoking but relapsed after getting back home. She was also started on Lasix for pedal edema. She saw her PCP last week and was given Cefdinir and prednisone However she became increasingly lethargic and in respiratory distress and daughter brought her in, noted to have respiratory acidosis on ABG and placed on BiPAP in the ED  SUBJECTIVE:  She wore BiPAP much of last night, did have a break. Is now off BiPAp and tolerating C/o continued SOB and cough   VITAL SIGNS: BP 154/68 mmHg  Pulse 93  Temp(Src) 98.8 F (37.1 C) (Oral)  Resp 25  Ht  (1.651 m)  Wt 49.7 kg (109 lb 9.1 oz)  BMI 18.23 kg/m2  SpO2 100%  HEMODYNAMICS:    VENTILATOR SETTINGS: Vent Mode:  [-] BIPAP FiO2 (%):  [30 %] 30 % Set Rate:  [15 bmp] 15 bmp PEEP:  [5 cmH20] 5 cmH20  INTAKE / OUTPUT:    PHYSICAL EXAMINATION: Gen. Pleasant, thin and cachectic, in no distress, off BiPAP ENT - no lesions, no post nasal drip, no JVD Neck: No JVD, no thyromegaly, no carotid bruits Lungs: no use of accessory muscles, no dullness to percussion, decreased without rales or rhonchi  Cardiovascular: Rhythm regular, heart sounds  normal, no murmurs or gallops, no peripheral edema Abdomen: soft and non-tender, no hepatosplenomegaly, BS normal. Musculoskeletal: No  deformities, no cyanosis or clubbing Neuro:  alert, non focal, no asterixis   LABS:  BMET  Recent Labs Lab 03/15/15 1430 03/16/15 0343  NA 143 139  K 4.9 5.1  CL 95* 94*  CO2 38* 35*  BUN 26* 34*  CREATININE 0.72 0.77  GLUCOSE 139* 154*    Electrolytes  Recent Labs Lab 03/15/15 1430 03/16/15 0343  CALCIUM 9.2 8.8*  MG  --  1.5*  PHOS  --  3.1    CBC  Recent Labs Lab 03/15/15 1430 03/16/15 0343  WBC 17.2* 17.3*  HGB 10.3* 9.9*  HCT 36.5 34.6*  PLT 251 269    Coag's  Recent Labs Lab 03/15/15 1430  INR 0.86    Sepsis Markers  Recent Labs Lab 03/15/15 1445 03/15/15 1504  LATICACIDVEN  --  1.04  PROCALCITON <0.10  --     ABG  Recent Labs Lab 03/15/15 1446  PHART 7.279*  PCO2ART 87.8*  PO2ART 64.1*    Liver Enzymes No results for input(s): AST, ALT, ALKPHOS, BILITOT, ALBUMIN in the last 168 hours.  Cardiac Enzymes  Recent Labs Lab 03/15/15 1430  TROPONINI <0.03    Glucose  Recent Labs Lab 03/15/15 1954 03/15/15 2312 03/16/15 0411  GLUCAP 166* 171* 165*    Imaging Dg Chest 1 View  03/15/2015  CLINICAL DATA:  Short of breath today EXAM: CHEST 1 VIEW COMPARISON:  12/11/2014 FINDINGS: Left basilar airspace disease has increased. Hyperaeration. Normal heart size. No pneumothorax. No pleural effusion. IMPRESSION: Left  basilar airspace disease has increased. This likely represents acute inflammatory disease upon chronic changes. Followup PA and lateral chest X-ray is recommended in 3-4 weeks following trial of antibiotic therapy to ensure resolution and exclude underlying malignancy. Electronically Signed   By: Jolaine Click M.D.   On: 03/15/2015 14:27     STUDIES:  Chest x-ray reviewed shows left lower lobe infiltrate, similar to that seen in 11/2014  CULTURES: 2/18 blood >>  ANTIBIOTICS: 2/18   Vancomycin >> 2/19 2/18 cefepime >> 2/19 2/19 azithro >>    DISCUSSION: She seems to have another episode of respiratory  failure, likely related to relapse of her smoking. She has a faint left lower lobe infiltrate which was also present in 11/2014- Not convinced this is a new pneumonia but we'll treat her as such given critical illness  ASSESSMENT / PLAN:  PULMONARY A: Acute hypercarbic respiratory failure Acute exacerbation of COPD P:   BiPAP change to prn Solu-Medrol 40 every 8 Listed allergy to brovana- hence we'll only use DuoNeb every 6 and when necessary She may be a candidate for home noninvasive ventilation (Trilogy) if we are to pursue all measures abx as below  Restart nasal steroid Would check CT chest once this flare is stabilizing to better characterize LLL infiltrate   CARDIOVASCULAR A:  HTN P:  Restart aspirin Hold off on home BP regimen for now  RENAL A:   No issues P:     GASTROINTESTINAL A:   No issues P:   Start diet 2/19 if tolerating long periods off BiPAP Protonix  HEMATOLOGIC A:   Leukocytosis related to steroids P:  Monitor  INFECTIOUS A:   Possible Left lower lobe HCAP P:   Procalcitonin negative 2/18; will change abx to azithro and to PO when able to tolerate  ENDOCRINE A:   Hyperglycemia steroids   P:   SSI  NEUROLOGIC A:   Acute encephalopathy-related to hypercarbia P:    Monitor, repeat ABG in mental status worsens   FAMILY  - Updates: Daughter at the bedside 2/18, Dr Vassie Loll discussed that repeat hospitalization is a poor prognostic sign, she does request a full code  - Inter-disciplinary family meet or Palliative Care meeting due by: 2/25, consider palliative care consult   Levy Pupa, MD, PhD 03/16/2015, 7:07 AM [facility] [phone] or if no answer [phone]

## 2015-03-16 NOTE — Progress Notes (Signed)
[facility] ADULT ICU REPLACEMENT PROTOCOL FOR AM LAB REPLACEMENT ONLY  The patient does apply for the [facility] Adult ICU Electrolyte Replacment Protocol based on the criteria listed below:   1. Is GFR >/= 40 ml/min? Yes.    Patient's GFR today is >60 2. Is urine output >/= 0.5 ml/kg/hr for the last 6 hours? Yes.   Patient's UOP is 1.3 ml/kg/hr 3. Is BUN < 60 mg/dL? Yes.    Patient's BUN today is 34 4. Abnormal electrolyte(s): Mag 1.5 5. Ordered repletion with: per protocol 6. If a panic level lab has been reported, has the CCM MD in charge been notified? No..   Physician:    Markus Daft A 03/16/2015 6:21 AM

## 2015-03-17 ENCOUNTER — Ambulatory Visit: Payer: Medicare Other | Admitting: Internal Medicine

## 2015-03-17 DIAGNOSIS — Z720 Tobacco use: Secondary | ICD-10-CM

## 2015-03-17 DIAGNOSIS — G9340 Encephalopathy, unspecified: Secondary | ICD-10-CM

## 2015-03-17 LAB — BLOOD GAS, ARTERIAL
Acid-Base Excess: 10.3 mmol/L — ABNORMAL HIGH (ref 0.0–2.0)
BICARBONATE: 35.9 meq/L — AB (ref 20.0–24.0)
DELIVERY SYSTEMS: POSITIVE
Drawn by: 11249
FIO2: 0.21
LHR: 12 {breaths}/min
MODE: POSITIVE
O2 Saturation: 95.3 %
PEEP: 5 cmH2O
PO2 ART: 81 mmHg (ref 80.0–100.0)
Patient temperature: 97.7
Pressure control: 5 cmH2O
TCO2: 33.2 mmol/L (ref 0–100)
pCO2 arterial: 54.4 mmHg — ABNORMAL HIGH (ref 35.0–45.0)
pH, Arterial: 7.432 (ref 7.350–7.450)

## 2015-03-17 LAB — MRSA CULTURE

## 2015-03-17 LAB — CBC
HCT: 33.9 % — ABNORMAL LOW (ref 36.0–46.0)
HEMOGLOBIN: 10.2 g/dL — AB (ref 12.0–15.0)
MCH: 26.4 pg (ref 26.0–34.0)
MCHC: 30.1 g/dL (ref 30.0–36.0)
MCV: 87.8 fL (ref 78.0–100.0)
PLATELETS: 284 10*3/uL (ref 150–400)
RBC: 3.86 MIL/uL — AB (ref 3.87–5.11)
RDW: 14.3 % (ref 11.5–15.5)
WBC: 20.2 10*3/uL — ABNORMAL HIGH (ref 4.0–10.5)

## 2015-03-17 LAB — GLUCOSE, CAPILLARY
GLUCOSE-CAPILLARY: 106 mg/dL — AB (ref 65–99)
GLUCOSE-CAPILLARY: 117 mg/dL — AB (ref 65–99)
GLUCOSE-CAPILLARY: 128 mg/dL — AB (ref 65–99)
GLUCOSE-CAPILLARY: 166 mg/dL — AB (ref 65–99)
Glucose-Capillary: 121 mg/dL — ABNORMAL HIGH (ref 65–99)
Glucose-Capillary: 136 mg/dL — ABNORMAL HIGH (ref 65–99)
Glucose-Capillary: 135 mg/dL — ABNORMAL HIGH (ref 65–99)

## 2015-03-17 LAB — BASIC METABOLIC PANEL
ANION GAP: 11 (ref 5–15)
BUN: 33 mg/dL — ABNORMAL HIGH (ref 6–20)
CALCIUM: 9 mg/dL (ref 8.9–10.3)
CO2: 34 mmol/L — AB (ref 22–32)
CREATININE: 0.8 mg/dL (ref 0.44–1.00)
Chloride: 96 mmol/L — ABNORMAL LOW (ref 101–111)
Glucose, Bld: 134 mg/dL — ABNORMAL HIGH (ref 65–99)
Potassium: 4.9 mmol/L (ref 3.5–5.1)
Sodium: 141 mmol/L (ref 135–145)

## 2015-03-17 LAB — MAGNESIUM: MAGNESIUM: 3.4 mg/dL — AB (ref 1.7–2.4)

## 2015-03-17 LAB — MRSA PCR SCREENING: MRSA BY PCR: NEGATIVE

## 2015-03-17 LAB — CBC AND DIFFERENTIAL: WBC: 20.2 10^3/mL

## 2015-03-17 MED ORDER — ENOXAPARIN SODIUM 40 MG/0.4ML ~~LOC~~ SOLN
40.0000 mg | SUBCUTANEOUS | Status: DC
Start: 2015-03-17 — End: 2015-03-25
  Administered 2015-03-17 – 2015-03-24 (×8): 40 mg via SUBCUTANEOUS
  Filled 2015-03-17 (×10): qty 0.4

## 2015-03-17 MED ORDER — HYDRALAZINE HCL 20 MG/ML IJ SOLN
10.0000 mg | INTRAMUSCULAR | Status: DC | PRN
  Administered 2015-03-18: 10 mg via INTRAVENOUS
  Filled 2015-03-17: qty 1

## 2015-03-17 MED ORDER — MORPHINE SULFATE (PF) 2 MG/ML IV SOLN: 2.0000 mg | Freq: Once | INTRAVENOUS | Status: AC

## 2015-03-17 MED ORDER — MORPHINE SULFATE (PF) 2 MG/ML IV SOLN
INTRAVENOUS | Status: AC
  Administered 2015-03-17: 2 mg via INTRAVENOUS
  Filled 2015-03-17: qty 1

## 2015-03-17 MED ORDER — ENSURE ENLIVE PO LIQD
237.0000 mL | Freq: Three times a day (TID) | ORAL | Status: DC
  Administered 2015-03-18 – 2015-03-24 (×7): 237 mL via ORAL

## 2015-03-17 MED ORDER — PANTOPRAZOLE SODIUM 40 MG PO TBEC
40.0000 mg | DELAYED_RELEASE_TABLET | Freq: Every day | ORAL | Status: DC
Start: 2015-03-17 — End: 2015-03-25
  Administered 2015-03-18 – 2015-03-25 (×7): 40 mg via ORAL
  Filled 2015-03-17 (×8): qty 1

## 2015-03-17 NOTE — Progress Notes (Signed)
PULMONARY / CRITICAL CARE MEDICINE   Name: Nicole Maxwell MRN: [id number] DOB: 03-27-1945    ADMISSION DATE:  03/15/2015   CHIEF COMPLAINT:  Respiratory distress  BRIEF: 70 y/o female with advanced COPD admitted with hypercarbic respiratory failure due to AE COPD.  This her second severe episode requiring ICU admission in 3 months.       SUBJECTIVE:  Confused this morning, required BIPAP mask from 8-10 Feeling better now   VITAL SIGNS: BP 165/73 mmHg  Pulse 93  Temp(Src) 97.3 F (36.3 C) (Oral)  Resp 15  Ht  (1.651 m)  Wt 50.3 kg (110 lb 14.3 oz)  BMI 18.45 kg/m2  SpO2 100%  HEMODYNAMICS:    VENTILATOR SETTINGS: Vent Mode:  [-] BIPAP FiO2 (%):  [30 %] 30 % Set Rate:  [12 bmp] 12 bmp PEEP:  [5 cmH20] 5 cmH20  INTAKE / OUTPUT: I/O last 3 completed shifts: In: 340 [I.V.:290; IV Piggyback:50] Out: 1250 [Urine:1250]  PHYSICAL EXAMINATION: Gen: mild tachypnea HENT: NCAT OP clear PULM: Notable wheezing, rhonchi bilaterally, poor air movement CV: RRR, no mgr GI: BS+, soft, nontender MSK: normal bulk and tone Neuro: Awake and alert, oriented x4  LABS:  BMET  Recent Labs Lab 03/15/15 1430 03/16/15 0343 03/17/15 0332  NA 143 139 141  K 4.9 5.1 4.9  CL 95* 94* 96*  CO2 38* 35* 34*  BUN 26* 34* 33*  CREATININE 0.72 0.77 0.80  GLUCOSE 139* 154* 134*    Electrolytes  Recent Labs Lab 03/15/15 1430 03/16/15 0343 03/16/15 1944 03/17/15 0332  CALCIUM 9.2 8.8*  --  9.0  MG  --  1.5* 3.8* 3.4*  PHOS  --  3.1  --   --     CBC  Recent Labs Lab 03/15/15 1430 03/16/15 0343 03/17/15 0332  WBC 17.2* 17.3* 20.2*  HGB 10.3* 9.9* 10.2*  HCT 36.5 34.6* 33.9*  PLT 251 269 284    Coag's  Recent Labs Lab 03/15/15 1430  INR 0.86    Sepsis Markers  Recent Labs Lab 03/15/15 1445 03/15/15 1504  LATICACIDVEN  --  1.04  PROCALCITON <0.10  --     ABG  Recent Labs Lab 03/15/15 1446  PHART 7.279*  PCO2ART 87.8*  PO2ART 64.1*    Liver  Enzymes No results for input(s): AST, ALT, ALKPHOS, BILITOT, ALBUMIN in the last 168 hours.  Cardiac Enzymes  Recent Labs Lab 03/15/15 1430  TROPONINI <0.03    Glucose  Recent Labs Lab 03/16/15 1135 03/16/15 1542 03/16/15 1938 03/17/15 0002 03/17/15 0333 03/17/15 0446  GLUCAP 98 111* 138* 106* 121* 128*    Imaging No results found.   STUDIES:  Chest x-ray reviewed shows left lower lobe infiltrate, similar to that seen in 11/2014  CULTURES: 2/18 blood >>  ANTIBIOTICS: 2/18   Vancomycin >> 2/19 2/18 cefepime >> 2/19 2/19 azithro >>    DISCUSSION: Nicole Maxwell has severe COPD and is in the hospital now with her second round in 3 months of acute respiratory failure with hypercapnea due to a COPD exacebation.  She continues to smoke cigarettes.  ASSESSMENT / PLAN:  PULMONARY A: Acute hypercarbic respiratory failure  Acute exacerbation of COPD P:   Maintain in ICU given hypercarbia, tenuous respiratory status Continue PRN BIPAP  Mandatory nocturnal BIPAP Titrate O2 to saturation 90-94% to minimize hypercarbia Counsel to quit smoking Continue duoneb Continue solumedrol Continue pulmicort  CARDIOVASCULAR A:  HTN P:  Restart aspirin Hold off on home BP regimen for  now  RENAL A:   No issues P:     GASTROINTESTINAL A:   No issues P:   Start diet Protonix to oral  HEMATOLOGIC A:   Leukocytosis related to steroids P:  Monitor  INFECTIOUS A:   Doubt HCAP P:   Continue azithro oral daily 5 days  ENDOCRINE A:   Hyperglycemia steroids   P:   SSI  NEUROLOGIC A:   Acute encephalopathy-related to hypercarbia > improved Treat hypercarbia P:   No sedating meds Monitor, repeat ABG in mental status worsens   My cc time 37 minutes   FAMILY  - Updates: Daughter by phone 2/20.  - Inter-disciplinary family meet or Palliative Care meeting due by: 2/25, consider palliative care consult  Heber Hyde, MD [facility] Pager:  [phone] Cell: [phone] After 3pm or if no response, call [phone]

## 2015-03-17 NOTE — Progress Notes (Signed)
Initial Nutrition Assessment  DOCUMENTATION CODES:   Severe malnutrition in context of chronic illness, Underweight  INTERVENTION:  -Provide Ensure Enlive TID, 350 kcal, 20 grams of protein per bottle  -Encourage PO intake  NUTRITION DIAGNOSIS:   Malnutrition related to poor appetite, chronic illness as evidenced by percent weight loss, moderate depletion of body fat, moderate depletions of muscle mass, energy intake < or equal to 50% for > or equal to 1 month.  GOAL:   Patient will meet greater than or equal to 90% of their needs  MONITOR:   PO intake, Supplement acceptance, Labs  REASON FOR ASSESSMENT:   Malnutrition Screening Tool  ASSESSMENT:   This is the second admission in the last 3 months with severe COPD. Pt has PMH of Hypertension; Osteopenia; PVD; Tobacco abuse; Vitamin D deficiency disease; Alcohol abuse, in remission (quit 2004); Shortness of breath; Asthma; Hyperlipidemia; On home oxygen therapy; COPD;Type II diabetes mellitus; and Arthritis.   Pt seen for MST. Pt states her appetite has been poor. She states she is consuming less then 50% of her usual diet, which has been ongoing for 1 month. Pt states she ate lunch but could not remember what she ate. Observed pt's tray in the room and it looked untouched. Pt states she has tried [employer] before, and is agreeable to receiving Ensure Enlive while here. Will provide Ensure TID to help meet her nutritional needs.  Per chart pt has 10 lb (9%) weight loss within 2 months which is significant for time frame. Pt present with confusion.  Pt states she has lost 75 lb but then states her usual body weight is 75 lb.   Conducted Nutrition focused physical exam, identified mild/moderate muscle and fat wasting.  Medications reviewed. Labs reviewed; magnesium 3.4  Diet Order:  Diet regular Room service appropriate?: Yes; Fluid consistency:: Thin  Skin:  Reviewed, no issues  Last BM:  03/15/2015  Height:   Ht  Readings from Last 1 Encounters:  03/15/15  (1.651 m)    Weight:   Wt Readings from Last 1 Encounters:  03/17/15 110 lb 14.3 oz (50.3 kg)    Ideal Body Weight:  56.8 kg  BMI:  Body mass index is 18.45 kg/(m^2).  Estimated Nutritional Needs:   Kcal:  1500-1700  Protein:  75-85g   Fluid:  1.5-1.7 L  EDUCATION NEEDS:   No education needs identified at this time  [place], Dietetic Intern Pager: [phone]

## 2015-03-17 NOTE — Progress Notes (Signed)
Placed pt on Bipap at this time due to increased WOB, pt complains of inc SOB, pt tolerating Bipap well, RT will continue to monitor.

## 2015-03-17 NOTE — Progress Notes (Signed)
Patient having increased work of breathing and elevated BP and decreased mentation, Bipap placed on patient. Patient O2 sat has increased and work of breathing has decreased. RT notified

## 2015-03-17 NOTE — Progress Notes (Signed)
eLink Physician-Brief Progress Note Patient Name: Nicole Maxwell DOB: 12-08-1945 MRN: [id number]   Date of Service  03/17/2015  HPI/Events of Note  Agitation - gets out of bed and drops sats into 70's. Nurse request waist belt restraint.  eICU Interventions  Will order waist belt restraint.     Intervention Category Minor Interventions: Agitation / anxiety - evaluation and management  Lenell Antu 03/17/2015, 6:43 PM

## 2015-03-17 NOTE — Progress Notes (Signed)
Patient attempting to get up with daughter despite belt restraint O2 sats at 74%. Patient reoriented and family educated O2 breaths given X2 saturation remains in the mid 90s after. Belt adjusted. Will continue to monitor.

## 2015-03-17 NOTE — Progress Notes (Signed)
[facility] P[facility]  Called emergently to bedside for increased work of breathing.  Tight on breathing exam  Plan BIPAP now  Albuterol now Morphine now Close monitoring in ICU May need intubation Daughter updated by me  Heber [place], MD [place] [facility] Pager: [phone] Cell: [phone] After 3pm or if no response, call [phone]

## 2015-03-17 NOTE — Progress Notes (Signed)
Pt currently resting comfortably on 2 LPM [place] and tolerating well.  BIPAP not needed at this time, RT to monitor and assess as needed.

## 2015-03-17 NOTE — Progress Notes (Signed)
eLink Pharmacist-Brief Progress Note Patient Name: Nicole Maxwell DOB: 12-16-1945 MRN: [id number]   Date of Service  03/17/2015  HPI/Events of Note  Medications reviewed. CrCl > 30 mL/min, wt. > 45 kg  eICU Interventions  Inc Lovenox to 40 mg q24h   eLink Provider: Bradd Burner, PharmD 03/17/2015, 3:50 PM

## 2015-03-17 NOTE — Progress Notes (Signed)
Pt refusing to go back on BIPAP at this time.  RN aware, RT to monitor and assess as needed.

## 2015-03-17 NOTE — Progress Notes (Signed)
eLink Physician-Brief Progress Note Patient Name: Nicole Maxwell DOB: Jan 01, 1946 MRN: [id number]   Date of Service  03/17/2015  HPI/Events of Note  Hypertension - BP = 176/90.   eICU Interventions  Will order Hydralazine 10 mg IV Q 4 hours PRN SBP > 160.     Intervention Category Intermediate Interventions: Hypertension - evaluation and management  [name] 03/17/2015, 10:34 PM

## 2015-03-17 NOTE — Progress Notes (Signed)
Patient pulled off Bipap due to "panic attack" because of too many windows. Patient placed on 2L [place] and after she calms down will replace BiPap, patient agreed to that. VS remained stable.  Will continue to monitor.

## 2015-03-18 ENCOUNTER — Inpatient Hospital Stay (HOSPITAL_COMMUNITY): Payer: Medicare Other

## 2015-03-18 DIAGNOSIS — I10 Essential (primary) hypertension: Secondary | ICD-10-CM

## 2015-03-18 DIAGNOSIS — F419 Anxiety disorder, unspecified: Secondary | ICD-10-CM

## 2015-03-18 LAB — GLUCOSE, CAPILLARY
GLUCOSE-CAPILLARY: 176 mg/dL — AB (ref 65–99)
Glucose-Capillary: 110 mg/dL — ABNORMAL HIGH (ref 65–99)
Glucose-Capillary: 92 mg/dL (ref 65–99)
Glucose-Capillary: 114 mg/dL — ABNORMAL HIGH (ref 65–99)
Glucose-Capillary: 110 mg/dL — ABNORMAL HIGH (ref 65–99)

## 2015-03-18 LAB — BASIC METABOLIC PANEL
Anion gap: 14 (ref 5–15)
BUN: 41 mg/dL — AB (ref 6–20)
CHLORIDE: 101 mmol/L (ref 101–111)
CO2: 32 mmol/L (ref 22–32)
CREATININE: 0.8 mg/dL (ref 0.44–1.00)
Calcium: 9.4 mg/dL (ref 8.9–10.3)
GFR calc Af Amer: >60 mL/min (ref >60)
GFR calc non Af Amer: >60 mL/min (ref >60)
GLUCOSE: 120 mg/dL — AB (ref 65–99)
Potassium: 4.8 mmol/L (ref 3.5–5.1)
Sodium: 147 mmol/L — ABNORMAL HIGH (ref 135–145)

## 2015-03-18 MED ORDER — ALPRAZOLAM 0.5 MG PO TABS: 0.5000 mg | ORAL_TABLET | Freq: Three times a day (TID) | ORAL | Status: DC | PRN

## 2015-03-18 MED ORDER — IRBESARTAN 150 MG PO TABS
150.0000 mg | ORAL_TABLET | Freq: Every day | ORAL | Status: DC
  Administered 2015-03-18 – 2015-03-25 (×8): 150 mg via ORAL
  Filled 2015-03-18 (×8): qty 1

## 2015-03-18 MED ORDER — GLUCERNA SHAKE PO LIQD
237.0000 mL | Freq: Three times a day (TID) | ORAL | Status: DC
  Administered 2015-03-18 – 2015-03-24 (×10): 237 mL via ORAL
  Filled 2015-03-18 (×22): qty 237

## 2015-03-18 MED ORDER — ALPRAZOLAM 0.5 MG PO TABS
0.5000 mg | ORAL_TABLET | Freq: Three times a day (TID) | ORAL | Status: DC | PRN
  Administered 2015-03-18 – 2015-03-24 (×12): 0.5 mg via ORAL
  Filled 2015-03-18 (×12): qty 1

## 2015-03-18 NOTE — Progress Notes (Signed)
Patients daughter stated her mom had been taking Ativan at least three times a day sometimes more since 2009. She expressed her concern for how confused and agitated her mom is. Patient stating she needs something for her nerves and panic attacks. Elink called and notified, MD not wanting to give anx meds at this time due to respiratory status. Will continue to monitor.

## 2015-03-18 NOTE — Progress Notes (Signed)
PULMONARY / CRITICAL CARE MEDICINE   Name: Nicole Maxwell MRN: [id number] DOB: [birth year]    ADMISSION DATE:  03/15/2015   CHIEF COMPLAINT:  Respiratory distress  BRIEF: 70 y/o female with advanced COPD admitted with hypercarbic respiratory failure due to AE COPD.  This her second severe episode requiring ICU admission in 3 months.       SUBJECTIVE:  Feels a little better this morning Off BIPAP several hours now Steam from food with breakfast made her a little more dyspneic Anxious, wants Xanax she takes at home   VITAL SIGNS: BP 163/78 mmHg  Pulse 100  Temp(Src) 97.7 F (36.5 C) (Axillary)  Resp 21  Ht  (1.651 m)  Wt 50.1 kg (110 lb 7.2 oz)  BMI 18.38 kg/m2  SpO2 95%  HEMODYNAMICS:    VENTILATOR SETTINGS: Vent Mode:  [-] PCV;BIPAP FiO2 (%):  [21 %] 21 % Set Rate:  [12 bmp] 12 bmp PEEP:  [5 cmH20] 5 cmH20  INTAKE / OUTPUT: I/O last 3 completed shifts: In: 20 [I.V.:20] Out: 450 [Urine:450]  PHYSICAL EXAMINATION: Gen: mild tachypnea, speaking full sentences HENT: NCAT OP clear PULM: Wheezing improved today, air movement improved but still with some airflow limitation CV: RRR, no mgr GI: BS+, soft, nontender MSK: normal bulk and tone Neuro: Awake and alert, oriented x4  LABS:  BMET  Recent Labs Lab 03/16/15 0343 03/17/15 0332 03/18/15 0325  NA 139 141 147*  K 5.1 4.9 4.8  CL 94* 96* 101  CO2 35* 34* 32  BUN 34* 33* 41*  CREATININE 0.77 0.80 0.80  GLUCOSE 154* 134* 120*    Electrolytes  Recent Labs Lab 03/16/15 0343 03/16/15 1944 03/17/15 0332 03/18/15 0325  CALCIUM 8.8*  --  9.0 9.4  MG 1.5* 3.8* 3.4*  --   PHOS 3.1  --   --   --     CBC  Recent Labs Lab 03/15/15 1430 03/16/15 0343 03/17/15 0332  WBC 17.2* 17.3* 20.2*  HGB 10.3* 9.9* 10.2*  HCT 36.5 34.6* 33.9*  PLT 251 269 284    Coag's  Recent Labs Lab 03/15/15 1430  INR 0.86    Sepsis Markers  Recent Labs Lab 03/15/15 1445 03/15/15 1504  LATICACIDVEN  --   1.04  PROCALCITON <0.10  --     ABG  Recent Labs Lab 03/15/15 1446 03/17/15 2200  PHART 7.279* 7.432  PCO2ART 87.8* 54.4*  PO2ART 64.1* 81.0    Liver Enzymes No results for input(s): AST, ALT, ALKPHOS, BILITOT, ALBUMIN in the last 168 hours.  Cardiac Enzymes  Recent Labs Lab 03/15/15 1430  TROPONINI <0.03    Glucose  Recent Labs Lab 03/17/15 1250 03/17/15 1559 03/17/15 1936 03/17/15 2346 03/18/15 0326 03/18/15 0737  GLUCAP 135* 117* 166* 92 110* 114*    Imaging No results found.   STUDIES:  Chest x-ray reviewed shows left lower lobe infiltrate, similar to that seen in 11/2014  CULTURES: 2/18 blood >>  ANTIBIOTICS: 2/18   Vancomycin >> 2/19 2/18 cefepime >> 2/19 2/19 azithro >>    DISCUSSION: [name] has severe COPD and is in the hospital now with her second round in 3 months of acute respiratory failure with hypercapnea due to a COPD exacebation.  She continues to smoke cigarettes.  Her condition remains tenuous.  She likely needs home triology vent given recurrent hospitalizations/hypercarbia.  ASSESSMENT / PLAN:  PULMONARY A: Acute hypercarbic respiratory failure  Acute exacerbation of COPD P:   CXR today to ensure nothing  else going on (pulm edema, etc) Maintain in ICU given hypercarbia, tenuous respiratory status Continue PRN BIPAP  Mandatory nocturnal BIPAP Titrate O2 to saturation 90-94% to minimize hypercarbia Counsel to quit smoking Continue duoneb Continue solumedrol, wean 2/22 Continue pulmicort  CARDIOVASCULAR A:  HTN P:  Continue aspirin Restart irbesartan today Prn hydralazine  RENAL A:   No issues P:     GASTROINTESTINAL A:   No issues P:   Start diet Protonix to oral  HEMATOLOGIC A:   Leukocytosis related to steroids P:  CBC intermittently  INFECTIOUS A:   Doubt HCAP P:   Continue azithro oral daily 5 days  ENDOCRINE A:   Hyperglycemia steroids   P:   SSI  NEUROLOGIC A:   Acute  encephalopathy-related to hypercarbia > improved Treat hypercarbia Anxiety P:   Add Xanax No sedating meds Monitor, repeat ABG in mental status worsens   My cc time 36 minutes   FAMILY  - Updates: Daughter bedside 2/22  - Inter-disciplinary family meet or Palliative Care meeting due by: 2/25, consider palliative care consult  Heber [place], MD [facility] Pager: [phone] Cell: [phone] After 3pm or if no response, call [phone]

## 2015-03-19 DIAGNOSIS — J9612 Chronic respiratory failure with hypercapnia: Secondary | ICD-10-CM

## 2015-03-19 LAB — BASIC METABOLIC PANEL
Anion gap: 8 (ref 5–15)
BUN: 47 mg/dL — ABNORMAL HIGH (ref 6–20)
CHLORIDE: 103 mmol/L (ref 101–111)
CO2: 33 mmol/L — ABNORMAL HIGH (ref 22–32)
CREATININE: 0.88 mg/dL (ref 0.44–1.00)
Calcium: 8.9 mg/dL (ref 8.9–10.3)
Glucose, Bld: 146 mg/dL — ABNORMAL HIGH (ref 65–99)
POTASSIUM: 5.3 mmol/L — AB (ref 3.5–5.1)
SODIUM: 144 mmol/L (ref 135–145)

## 2015-03-19 LAB — GLUCOSE, CAPILLARY
GLUCOSE-CAPILLARY: 119 mg/dL — AB (ref 65–99)
GLUCOSE-CAPILLARY: 161 mg/dL — AB (ref 65–99)
GLUCOSE-CAPILLARY: 278 mg/dL — AB (ref 65–99)
GLUCOSE-CAPILLARY: 88 mg/dL (ref 65–99)
Glucose-Capillary: 115 mg/dL — ABNORMAL HIGH (ref 65–99)
Glucose-Capillary: 133 mg/dL — ABNORMAL HIGH (ref 65–99)
Glucose-Capillary: 55 mg/dL — ABNORMAL LOW (ref 65–99)

## 2015-03-19 LAB — POTASSIUM: Potassium: 4.4 mmol/L (ref 3.5–5.1)

## 2015-03-19 MED ORDER — METHYLPREDNISOLONE SODIUM SUCC 40 MG IJ SOLR
40.0000 mg | Freq: Two times a day (BID) | INTRAMUSCULAR | Status: DC
  Administered 2015-03-19 – 2015-03-23 (×8): 40 mg via INTRAVENOUS
  Filled 2015-03-19 (×10): qty 1

## 2015-03-19 NOTE — Progress Notes (Signed)
Inpatient Diabetes Program Recommendations  AACE/ADA: New Consensus Statement on Inpatient Glycemic Control (2015)  Target Ranges:  Prepandial:   less than 140 mg/dL      Peak postprandial:   less than 180 mg/dL (1-2 hours)      Critically ill patients:  140 - 180 mg/dL   Review of Glycemic Control  Diabetes history: DM2 Outpatient Diabetes medications: metformin 500 mg bid Current orders for Inpatient glycemic control: Novolog moderate Q4H  Results for IRAIS, MOTTRAM (MRN [id number]) as of 03/19/2015 09:40  Ref. Range 03/18/2015 12:50 03/18/2015 16:22 03/18/2015 19:55 03/18/2015 23:51 03/19/2015 00:12  Glucose-Capillary Latest Ref Range: 65-99 mg/dL 409 (H) 811 (H) 914 (H) 55 (L) 88   Hypoglycemia likely d/t large correction dose and poor po intake.  Inpatient Diabetes Program Recommendations:   Decrease Novolog to sensitive tidwc. Change diet to CHO mod med.  Will continue to follow. Thank you. Ailene Ards, RD, LDN, CDE Inpatient Diabetes Coordinator [phone]

## 2015-03-19 NOTE — Progress Notes (Signed)
PULMONARY / CRITICAL CARE MEDICINE   Name: Nicole Maxwell MRN: [id number] DOB: [birth year]    ADMISSION DATE:  03/15/2015   CHIEF COMPLAINT:  Respiratory distress  BRIEF: 70 y/o female with advanced COPD admitted with hypercarbic respiratory failure due to AE COPD.  This her second severe episode requiring ICU admission in 3 months.       SUBJECTIVE:  Slept with BIPAP Comfortable this morning Eating this morning   VITAL SIGNS: BP 144/63 mmHg  Pulse 81  Temp(Src) 98.3 F (36.8 C) (Axillary)  Resp 20  Ht  (1.651 m)  Wt 116 lb 2.9 oz (52.7 kg)  BMI 19.33 kg/m2  SpO2 98%  HEMODYNAMICS:    VENTILATOR SETTINGS: Vent Mode:  [-] BIPAP FiO2 (%):  [30 %-50 %] 30 % Set Rate:  [12 bmp] 12 bmp PEEP:  [5 cmH20] 5 cmH20  INTAKE / OUTPUT: I/O last 3 completed shifts: In: 1317 [P.O.:1317] Out: 1125 [Urine:1125]  PHYSICAL EXAMINATION: Gen: breathing comfortably this morning, eating well HENT: NCAT OP clear PULM: minimal wheezing today, air movement improved CV: RRR, no mgr GI: BS+, soft, nontender MSK: normal bulk and tone Neuro: Awake and alert, oriented x4  LABS:  BMET  Recent Labs Lab 03/17/15 0332 03/18/15 0325 03/19/15 0359  NA 141 147* 144  K 4.9 4.8 5.3*  CL 96* 101 103  CO2 34* 32 33*  BUN 33* 41* 47*  CREATININE 0.80 0.80 0.88  GLUCOSE 134* 120* 146*    Electrolytes  Recent Labs Lab 03/16/15 0343 03/16/15 1944 03/17/15 0332 03/18/15 0325 03/19/15 0359  CALCIUM 8.8*  --  9.0 9.4 8.9  MG 1.5* 3.8* 3.4*  --   --   PHOS 3.1  --   --   --   --     CBC  Recent Labs Lab 03/15/15 1430 03/16/15 0343 03/17/15 0332  WBC 17.2* 17.3* 20.2*  HGB 10.3* 9.9* 10.2*  HCT 36.5 34.6* 33.9*  PLT 251 269 284    Coag's  Recent Labs Lab 03/15/15 1430  INR 0.86    Sepsis Markers  Recent Labs Lab 03/15/15 1445 03/15/15 1504  LATICACIDVEN  --  1.04  PROCALCITON <0.10  --     ABG  Recent Labs Lab 03/15/15 1446 03/17/15 2200  PHART  7.279* 7.432  PCO2ART 87.8* 54.4*  PO2ART 64.1* 81.0    Liver Enzymes No results for input(s): AST, ALT, ALKPHOS, BILITOT, ALBUMIN in the last 168 hours.  Cardiac Enzymes  Recent Labs Lab 03/15/15 1430  TROPONINI <0.03    Glucose  Recent Labs Lab 03/18/15 0737 03/18/15 1250 03/18/15 1622 03/18/15 1955 03/18/15 2351 03/19/15 0012  GLUCAP 114* 176* 110* 278* 55* 88    Imaging 2/22 CXR 2 view> clear lungs, emphysema  STUDIES:    CULTURES: 2/18 blood >>  ANTIBIOTICS: 2/18   Vancomycin >> 2/19 2/18 cefepime >> 2/19 2/19 azithro >>    DISCUSSION: Ms. Lehr has severe COPD and is in the hospital now with her second round in 3 months of acute respiratory failure with hypercapnea due to a COPD exacebation.  She continues to smoke cigarettes.  She has begun to stabilize as of 2/22.  She needs home triology vent given recurrent hospitalizations/hypercarbia.  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 respiratory failure  Acute exacerbation of COPD P:   Change to SDU status Mandatory nocturnal BIPAP > needs home Trilogy vent Titrate O2 to saturation 90-94% to minimize hypercarbia Counsel to quit smoking Continue  duoneb Wean solumedrol 2/22 Continue pulmicort  CARDIOVASCULAR A:  HTN P:  Continue aspirin Continue irbesartan  Prn hydralazine  RENAL A:   No issues P:    GASTROINTESTINAL A:   No issues P:   Start diet Continue protonix  HEMATOLOGIC A:   Leukocytosis related to steroids P:  CBC intermittently  INFECTIOUS A:   AE COPD but she does not have HCAP P:   Continue azithro oral daily 5 days  ENDOCRINE A:   Hyperglycemia steroids   P:   SSI  NEUROLOGIC A:   Acute encephalopathy-related to hypercarbia > improved Treat hypercarbia Anxiety Deconditioning P:   Continue prn Xanax No sedating meds Monitor, repeat ABG in mental status worsens PT consult today: out of bed  Change to SDU status   FAMILY   - Updates: Daughter bedside 2/22  - Inter-disciplinary family meet or Palliative Care meeting due by: 2/25, consider palliative care consult  Heber [place], MD [place] [facility] Pager: [phone] Cell: [phone] After 3pm or if no response, call [phone]

## 2015-03-19 NOTE — Care Management Note (Signed)
Case Management Note  Patient Details  Name: SHAWNISE PETERKIN MRN: [id number] Date of Birth: 1945/04/09  Subjective/Objective:           resp failure         Action/Plan:Date: March 19, 2015 Chart reviewed for concurrent status and case management needs. Will continue to follow patient for changes and needs: Marcelle Smiling, BSN, RN, [place]   [phone]   Expected Discharge Date:                  Expected Discharge Plan:  Home w Home Health Services  In-House Referral:  NA  Discharge planning Services  CM Consult  Post Acute Care Choice:  Home Health, Durable Medical Equipment Choice offered to:  Patient  DME Arranged:  Ventilator DME Agency:  [facility]  HH Arranged:  RN HH Agency:  [facility]  Status of Service:     Medicare Important Message Given:    Date Medicare IM Given:    Medicare IM give by:    Date Additional Medicare IM Given:    Additional Medicare Important Message give by:     If discussed at Long Length of Stay Meetings, dates discussed:    Additional Comments:  Golda Acre, RN 03/19/2015, 10:54 AM

## 2015-03-20 DIAGNOSIS — R29898 Other symptoms and signs involving the musculoskeletal system: Secondary | ICD-10-CM

## 2015-03-20 LAB — GLUCOSE, CAPILLARY
GLUCOSE-CAPILLARY: 108 mg/dL — AB (ref 65–99)
GLUCOSE-CAPILLARY: 189 mg/dL — AB (ref 65–99)
GLUCOSE-CAPILLARY: 192 mg/dL — AB (ref 65–99)
GLUCOSE-CAPILLARY: 85 mg/dL (ref 65–99)
Glucose-Capillary: 77 mg/dL (ref 65–99)
Glucose-Capillary: 160 mg/dL — ABNORMAL HIGH (ref 65–99)
Glucose-Capillary: 141 mg/dL — ABNORMAL HIGH (ref 65–99)

## 2015-03-20 LAB — CULTURE, BLOOD (ROUTINE X 2)
CULTURE: NO GROWTH
Culture: NO GROWTH

## 2015-03-20 LAB — BASIC METABOLIC PANEL
ANION GAP: 8 (ref 5–15)
BUN: 40 mg/dL — ABNORMAL HIGH (ref 6–20)
CALCIUM: 8.7 mg/dL — AB (ref 8.9–10.3)
CHLORIDE: 103 mmol/L (ref 101–111)
CO2: 34 mmol/L — ABNORMAL HIGH (ref 22–32)
CREATININE: 0.8 mg/dL (ref 0.44–1.00)
GFR calc non Af Amer: >60 mL/min (ref >60)
Glucose, Bld: 107 mg/dL — ABNORMAL HIGH (ref 65–99)
Potassium: 4.6 mmol/L (ref 3.5–5.1)
SODIUM: 145 mmol/L (ref 135–145)

## 2015-03-20 NOTE — Progress Notes (Signed)
PULMONARY / CRITICAL CARE MEDICINE   Name: Nicole Maxwell MRN: [id number] DOB: [birth year]    ADMISSION DATE:  03/15/2015   CHIEF COMPLAINT:  Respiratory distress  BRIEF: 70 y/o female with advanced COPD admitted with hypercarbic respiratory failure due to AE COPD.  This her second severe episode requiring ICU admission in 3 months.       SUBJECTIVE:  Didn't receive BIPAP overnight Didn't get PT yesterday Doing well otherwise Slept OK   VITAL SIGNS: BP 87/65 mmHg  Pulse 97  Temp(Src) 98.3 F (36.8 C) (Oral)  Resp 22  Ht  (1.651 m)  Wt 49.9 kg (110 lb 0.2 oz)  BMI 18.31 kg/m2  SpO2 93%  HEMODYNAMICS:    VENTILATOR SETTINGS:    INTAKE / OUTPUT: I/O last 3 completed shifts: In: 597 [P.O.:597] Out: 275 [Urine:275]  PHYSICAL EXAMINATION: Gen: breathing comfortably this morning HENT: NCAT OP clear PULM: some wheezing bilaterally, limited air movement still but improved from admission CV: RRR, no mgr GI: BS+, soft, nontender MSK: normal bulk and tone Neuro: Awake and alert, oriented x4  LABS:  BMET  Recent Labs Lab 03/18/15 0325 03/19/15 0359 03/19/15 1015 03/20/15 0343  NA 147* 144  --  145  K 4.8 5.3* 4.4 4.6  CL 101 103  --  103  CO2 32 33*  --  34*  BUN 41* 47*  --  40*  CREATININE 0.80 0.88  --  0.80  GLUCOSE 120* 146*  --  107*    Electrolytes  Recent Labs Lab 03/16/15 0343 03/16/15 1944 03/17/15 0332 03/18/15 0325 03/19/15 0359 03/20/15 0343  CALCIUM 8.8*  --  9.0 9.4 8.9 8.7*  MG 1.5* 3.8* 3.4*  --   --   --   PHOS 3.1  --   --   --   --   --     CBC  Recent Labs Lab 03/15/15 1430 03/16/15 0343 03/17/15 0332  WBC 17.2* 17.3* 20.2*  HGB 10.3* 9.9* 10.2*  HCT 36.5 34.6* 33.9*  PLT 251 269 284    Coag's  Recent Labs Lab 03/15/15 1430  INR 0.86    Sepsis Markers  Recent Labs Lab 03/15/15 1445 03/15/15 1504  LATICACIDVEN  --  1.04  PROCALCITON <0.10  --     ABG  Recent Labs Lab 03/15/15 1446  03/17/15 2200  PHART 7.279* 7.432  PCO2ART 87.8* 54.4*  PO2ART 64.1* 81.0    Liver Enzymes No results for input(s): AST, ALT, ALKPHOS, BILITOT, ALBUMIN in the last 168 hours.  Cardiac Enzymes  Recent Labs Lab 03/15/15 1430  TROPONINI <0.03    Glucose  Recent Labs Lab 03/19/15 1150 03/19/15 1702 03/19/15 2016 03/20/15 0008 03/20/15 0354 03/20/15 0716  GLUCAP 161* 115* 189* 192* 85 77    Imaging 2/22 CXR 2 view> clear lungs, emphysema  STUDIES:    CULTURES: 2/18 blood >>  ANTIBIOTICS: 2/18   Vancomycin >> 2/19 2/18 cefepime >> 2/19 2/19 azithro >> 2/23   DISCUSSION: Ms. Keng has severe COPD and is in the hospital now with her second round in 3 months of acute respiratory failure with hypercapnea due to a COPD exacebation.  She continues to smoke cigarettes.  She has begun to stabilize as of 2/22.  She needs home triology vent given recurrent hospitalizations/hypercarbia.  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 respiratory failure  Acute exacerbation of COPD P:   Transfer to floor Mandatory nocturnal BIPAP > needs home Trilogy vent Titrate  O2 to saturation 90-94% to minimize hypercarbia Counsel to quit smoking Continue duoneb Continue current dose of solumedrol 2/23, plan prednisone 2/24 Continue pulmicort  CARDIOVASCULAR A:  HTN P:  Continue aspirin Continue irbesartan  Prn hydralazine  RENAL A:   No issues P:    GASTROINTESTINAL A:   No issues P:   Regular diet Continue protonix  HEMATOLOGIC A:   Leukocytosis related to steroids P:  CBC intermittently  INFECTIOUS A:   AE COPD but she does not have HCAP P:   Continue azithro oral daily 5 days (ends 2/23)  ENDOCRINE A:   Hyperglycemia steroids   P:   SSI  NEUROLOGIC A:   Acute encephalopathy-related to hypercarbia > improved Treat hypercarbia Anxiety Deconditioning P:   Continue prn Xanax No sedating meds Monitor, repeat ABG in mental  status worsens PT consult TODAY: out of bed, called [phone] as she was not seen yesterday  Move to med-surg   FAMILY  - Updates: Daughter bedside 2/22  - Inter-disciplinary family meet or Palliative Care meeting due by: 2/25, consider palliative care consult  Heber [place], MD [place] [facility] Pager: [phone] Cell: [phone] After 3pm or if no response, call [phone]

## 2015-03-20 NOTE — Progress Notes (Signed)
Patient currently on 1 L [place] with O2 sats of 93-94%. No distress is noted at this time. BiPAP available if patient becomes labored. RT will continue to monitor patient.

## 2015-03-20 NOTE — Evaluation (Addendum)
Physical Therapy Evaluation Patient Details Name: Nicole Maxwell MRN: [id number] DOB: [birth year] Today's Date: 03/20/2015   History of Present Illness  70 y/o female with advanced COPD admitted 2/18 with hypercarbic respiratory failure due to  COPD. This her second severe episode requiring ICU admission in 3 months.   Clinical Impression  Patient was able to ambulate with extra time x 150' with RW. Patient and daughter indicate that patient may need to live in independent vs  ALF.  Patient will benefit from PT to address problems listed in the note below.    Follow Up Recommendations SNF;Supervision/Assistance - 24 hour (daughter reports interest in finding  and Independent of ALF as patient is struggling at  managing alone.)    Equipment Recommendations  None recommended by PT    Recommendations for Other Services   OT    Precautions / Restrictions Precautions Precautions: Fall Precaution Comments: monitor sats/HR      Mobility  Bed Mobility               General bed mobility comments: in recliner  Transfers Overall transfer level: Needs assistance Equipment used: Rolling walker (2 wheeled) Transfers: Sit to/from Stand Sit to Stand: Min guard            Ambulation/Gait Ambulation/Gait assistance: Min assist;+2 safety/equipment Ambulation Distance (Feet): 150 Feet Assistive device: Rolling walker (2 wheeled) Gait Pattern/deviations: Step-through pattern;Steppage Gait velocity: decr   General Gait Details: more pronounced steppage on the R. HR 120 max, sats on  2 liters >92% dyspnea 3/4, RR mid-hi 20's. Stopped x 4 for stand and rest break/  Stairs            Wheelchair Mobility    Modified Rankin (Stroke Patients Only)       Balance Overall balance assessment: Needs assistance;History of Falls Sitting-balance support: No upper extremity supported;Feet supported Sitting balance-Leahy Scale: Good     Standing balance support: During  functional activity;No upper extremity supported Standing balance-Leahy Scale: Fair                               Pertinent Vitals/Pain Pain Assessment: Faces Faces Pain Scale: Hurts little more Pain Location: bottoms of feet feel like walking on rocks    Home Living Family/patient expects to be discharged to:: Private residence Living Arrangements: Alone Available Help at Discharge: Family;Available PRN/intermittently Type of Home: House Home Access: Level entry     Home Layout: One level Home Equipment: Walker - 2 wheels;Shower seat;Cane - single point      Prior Function Level of Independence: Independent with assistive device(s)               Hand Dominance        Extremity/Trunk Assessment   Upper Extremity Assessment: Overall WFL for tasks assessed           Lower Extremity Assessment: RLE deficits/detail;LLE deficits/detail RLE Deficits / Details: noted foot drop and steppage       Communication   Communication: No difficulties  Cognition Arousal/Alertness: Awake/alert Behavior During Therapy: Anxious Overall Cognitive Status: Within Functional Limits for tasks assessed                      General Comments      Exercises        Assessment/Plan    PT Assessment Patient needs continued PT services  PT Diagnosis Generalized weakness;Difficulty walking   PT  Problem List Decreased strength;Decreased activity tolerance;Decreased mobility;Cardiopulmonary status limiting activity;Pain;Impaired sensation  PT Treatment Interventions DME instruction;Gait training   PT Goals (Current goals can be found in the Care Plan section) Acute Rehab PT Goals Patient Stated Goal: to get back independent PT Goal Formulation: With patient/family Time For Goal Achievement: 04/03/15 Potential to Achieve Goals: Good    Frequency Min 3X/week   Barriers to discharge Decreased caregiver support      Co-evaluation                End of Session Equipment Utilized During Treatment: Oxygen;Gait belt Activity Tolerance: Patient tolerated treatment well Patient left: in chair;with call bell/phone within reach;with family/visitor present Nurse Communication: Mobility status         Time: 8119-1478 PT Time Calculation (min) (ACUTE ONLY): 23 min   Charges:   PT Evaluation $PT Eval Moderate Complexity: 1 Procedure  1 gait   PT G CodesRada Hay 03/20/2015, 1:34 PM Blanchard Kelch PT [phone]

## 2015-03-21 LAB — BASIC METABOLIC PANEL
ANION GAP: 7 (ref 5–15)
BUN: 37 mg/dL — AB (ref 4–21)
BUN: 37 mg/dL — ABNORMAL HIGH (ref 6–20)
CALCIUM: 8.7 mg/dL — AB (ref 8.9–10.3)
CO2: 31 mmol/L (ref 22–32)
Chloride: 105 mmol/L (ref 101–111)
Creatinine, Ser: 0.62 mg/dL (ref 0.44–1.00)
Glucose, Bld: 105 mg/dL — ABNORMAL HIGH (ref 65–99)
Potassium: 4.6 mmol/L (ref 3.5–5.1)
SODIUM: 143 mmol/L (ref 135–145)
Sodium: 143 mmol/L (ref 137–147)

## 2015-03-21 LAB — GLUCOSE, CAPILLARY
GLUCOSE-CAPILLARY: 85 mg/dL (ref 65–99)
GLUCOSE-CAPILLARY: 153 mg/dL — AB (ref 65–99)
GLUCOSE-CAPILLARY: 150 mg/dL — AB (ref 65–99)
Glucose-Capillary: 198 mg/dL — ABNORMAL HIGH (ref 65–99)
Glucose-Capillary: 94 mg/dL (ref 65–99)

## 2015-03-21 NOTE — Care Management Note (Signed)
Case Management Note  Patient Details  Name: Nicole Maxwell MRN: [id number] Date of Birth: 30-Jan-1945  Subjective/Objective:    Admitted with respiratory failure, COPD                Action/Plan: Discharge planning per CSW  Expected Discharge Date:                  Expected Discharge Plan:  Skilled Nursing Facility  In-House Referral:  Clinical Social Work  Discharge planning Services  CM Consult  Post Acute Care Choice:  NA Choice offered to:  NA  DME Arranged:  N/A DME Agency:  NA  HH Arranged:  NA HH Agency:  NA  Status of Service:  Completed, signed off  Medicare Important Message Given:    Date Medicare IM Given:    Medicare IM give by:    Date Additional Medicare IM Given:    Additional Medicare Important Message give by:     If discussed at Long Length of Stay Meetings, dates discussed:    Additional Comments:  Alexis Goodell, RN 03/21/2015, 11:28 AM [phone]

## 2015-03-21 NOTE — Progress Notes (Signed)
RT came to room to administer neb treatment. Patient was found with Dickson in place, but not connected to O2. O2 saturations were 87-88% on RA. Patient given treatment and placed on 2 L [place]. RN and CNA aware of finding. RT placed extension tubing on  so patient could walk to bathroom with O2. Importance explained to patient. RT will continue to monitor patient.

## 2015-03-21 NOTE — Care Management Important Message (Signed)
Important Message  Patient Details  Name: Nicole Maxwell MRN: [id number] Date of Birth: [birth year]   Medicare Important Message Given:  Yes    Haskell Flirt 03/21/2015, 1:12 PMImportant Message  Patient Details  Name: Nicole Maxwell MRN: [id number] Date of Birth: [birth year]   Medicare Important Message Given:  Yes    Haskell Flirt 03/21/2015, 1:12 PM

## 2015-03-21 NOTE — Progress Notes (Signed)
Physical Therapy Treatment Patient Details Name: Nicole Maxwell MRN: [id number] DOB: [birth year] Today's Date: 03/21/2015    History of Present Illness 70 y/o female with advanced COPD admitted 2/18 with hypercarbic respiratory failure due to  COPD. This her second severe episode requiring ICU admission in 3 months.     PT Comments    Assisted OOB to amb in hallway while monitoring RA and HR( see below)  Follow Up Recommendations  SNF;Supervision/Assistance - 24 hour     Equipment Recommendations  None recommended by PT    Recommendations for Other Services       Precautions / Restrictions Precautions Precautions: Fall Precaution Comments: monitor sats/HR Restrictions Weight Bearing Restrictions: No    Mobility  Bed Mobility Overal bed mobility: Modified Independent                Transfers Overall transfer level: Needs assistance Equipment used: Rolling walker (2 wheeled) Transfers: Sit to/from Stand Sit to Stand: Min guard         General transfer comment: 50% VC's on safety and environmental obsticles plus O2 tubing   Ambulation/Gait Ambulation/Gait assistance: Min guard Ambulation Distance (Feet): 85 Feet Assistive device: Rolling walker (2 wheeled) Gait Pattern/deviations: Step-to pattern;Step-through pattern;Drifts right/left Gait velocity: too fast for pt's COPD capacity.  VC's to decrease gait speed and perform purse lip breathing.     General Gait Details: shaky unsteady gait.  Impulsive.  Limited distance due to dyspnea.  Amb on RA lowest sat 88% with noted 2/4 dyspnea.  Pt required extended rest break to decrease RR and increase sats to 94%.  HR 89.     Stairs            Wheelchair Mobility    Modified Rankin (Stroke Patients Only)       Balance                                    Cognition Arousal/Alertness: Awake/alert Behavior During Therapy: Anxious Overall Cognitive Status: Within Functional Limits for  tasks assessed                      Exercises      General Comments        Pertinent Vitals/Pain Pain Assessment: No/denies pain    Home Living                      Prior Function            PT Goals (current goals can now be found in the care plan section) Progress towards PT goals: Progressing toward goals    Frequency  Min 3X/week    PT Plan      Co-evaluation             End of Session Equipment Utilized During Treatment: Gait belt Activity Tolerance: Patient tolerated treatment well Patient left: Other (comment) (in bathroom instructed to pull call light.  Pt understood. )     Time: 2841-3244 PT Time Calculation (min) (ACUTE ONLY): 10 min  Charges:  $Gait Training: 8-22 mins                    G Codes:      Nicole Maxwell  PTA [facility]  Acute  Rehab Pager      [phone]

## 2015-03-21 NOTE — Progress Notes (Signed)
PULMONARY / CRITICAL CARE MEDICINE   Name: Nicole Maxwell MRN: [id number] DOB: 02/23/1945    ADMISSION DATE:  03/15/2015   CHIEF COMPLAINT:  Respiratory distress  BRIEF: 70 y/o female with advanced COPD admitted with hypercarbic respiratory failure due to AE COPD.  This her second severe episode requiring ICU admission in 3 months.       SUBJECTIVE:  Didn't receive BIPAP overnight Didn't get PT yesterday Doing well otherwise Slept OK   VITAL SIGNS: BP 115/92 mmHg  Pulse 99  Temp(Src) 98 F (36.7 C) (Oral)  Resp 18  Ht  (1.651 m)  Wt 111 lb 15.9 oz (50.8 kg)  BMI 18.64 kg/m2  SpO2 97%  HEMODYNAMICS:    VENTILATOR SETTINGS:    INTAKE / OUTPUT: I/O last 3 completed shifts: In: 900 [P.O.:900] Out: 350 [Urine:350]  PHYSICAL EXAMINATION: Gen: breathing comfortably this morning HENT: NCAT OP clear PULM: some wheezing bilaterally, limited air movement still but improved from admission CV: RRR, no mgr GI: BS+, soft, nontender MSK: normal bulk and tone Neuro: Awake and alert, oriented x4  LABS:  BMET  Recent Labs Lab 03/19/15 0359 03/19/15 1015 03/20/15 0343 03/21/15 0422  NA 144  --  145 143  K 5.3* 4.4 4.6 4.6  CL 103  --  103 105  CO2 33*  --  34* 31  BUN 47*  --  40* 37*  CREATININE 0.88  --  0.80 0.62  GLUCOSE 146*  --  107* 105*    Electrolytes  Recent Labs Lab 03/16/15 0343 03/16/15 1944 03/17/15 0332  03/19/15 0359 03/20/15 0343 03/21/15 0422  CALCIUM 8.8*  --  9.0  < > 8.9 8.7* 8.7*  MG 1.5* 3.8* 3.4*  --   --   --   --   PHOS 3.1  --   --   --   --   --   --   < > = values in this interval not displayed.  CBC  Recent Labs Lab 03/15/15 1430 03/16/15 0343 03/17/15 0332  WBC 17.2* 17.3* 20.2*  HGB 10.3* 9.9* 10.2*  HCT 36.5 34.6* 33.9*  PLT 251 269 284    Coag's  Recent Labs Lab 03/15/15 1430  INR 0.86    Sepsis Markers  Recent Labs Lab 03/15/15 1445 03/15/15 1504  LATICACIDVEN  --  1.04  PROCALCITON <0.10   --     ABG  Recent Labs Lab 03/15/15 1446 03/17/15 2200  PHART 7.279* 7.432  PCO2ART 87.8* 54.4*  PO2ART 64.1* 81.0    Liver Enzymes No results for input(s): AST, ALT, ALKPHOS, BILITOT, ALBUMIN in the last 168 hours.  Cardiac Enzymes  Recent Labs Lab 03/15/15 1430  TROPONINI <0.03    Glucose  Recent Labs Lab 03/20/15 1546 03/20/15 2001 03/21/15 0027 03/21/15 0427 03/21/15 0750 03/21/15 1153  GLUCAP 108* 141* 198* 94 85 153*    Imaging 2/22 CXR 2 view> clear lungs, emphysema  STUDIES:    CULTURES: 2/18 blood >>  ANTIBIOTICS: 2/18   Vancomycin >> 2/19 2/18 cefepime >> 2/19 2/19 azithro >> 2/23   DISCUSSION: Nicole Maxwell has severe COPD and is in the hospital now with her second round in 3 months of acute respiratory failure with hypercapnea due to a COPD exacebation.  She continues to smoke cigarettes.  She has begun to stabilize as of 2/22.  She needs home triology vent given recurrent hospitalizations/hypercarbia.  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 respiratory failure (frequent bouts  of sleepiness this hospitalization and prior hospital visit) Acute exacerbation of COPD P:   Mandatory nocturnal BIPAP > needs home Trilogy vent Titrate O2 to saturation 90-94% to minimize hypercarbia Counsel to quit smoking Continue duoneb Continue current dose of solumedrol 2/24, plan prednisone 2/25 Continue pulmicort  CARDIOVASCULAR A:  HTN P:  Continue aspirin Continue irbesartan  Prn hydralazine  RENAL A:   No issues P:    GASTROINTESTINAL A:   No issues P:   Regular diet Continue protonix  HEMATOLOGIC A:   Leukocytosis related to steroids P:  CBC intermittently  INFECTIOUS A:   AE COPD but she does not have HCAP P:   Monitor off of antibiotics  ENDOCRINE A:   Hyperglycemia steroids   P:   SSI  NEUROLOGIC A:   Acute encephalopathy-related to hypercarbia > improved Treat  hypercarbia Anxiety Deconditioning P:   Continue prn Xanax No sedating meds Monitor, repeat ABG in mental status worsens PT consult TODAY: out of bed  Stay in med-surg, plan d/c to home or SNF 2/27; she is considering SNF   FAMILY  - Updates: Daughter bedside 2/22  - Inter-disciplinary family meet or Palliative Care meeting due by: 2/25, consider palliative care consult  Heber [place], MD [place] [facility] Pager: [phone] Cell: [phone] After 3pm or if no response, call [phone]

## 2015-03-21 NOTE — Clinical Social Work Note (Signed)
Clinical Social Work Assessment  Patient Details  Name: Nicole Maxwell MRN: [id number] Date of Birth: Jul 17, 1945  Date of referral:  03/21/15               Reason for consult:  Facility Placement                Permission sought to share information with:  Family Supports Permission granted to share information::  Yes, Verbal Permission Granted  Name::     [name]  Agency::     Relationship::  daughter  Contact Information:  [phone]  Housing/Transportation Living arrangements for the past 2 months:  [place] of Information:  Patient Patient Interpreter Needed:  None Criminal Activity/Legal Involvement Pertinent to Current Situation/Hospitalization:  No - Comment as needed Significant Relationships:  Adult Children Lives with:  Self Do you feel safe going back to the place where you live?  No Need for family participation in patient care:  Yes (Comment)  Care giving concerns:  Pt lives alone and is no longer able to do so.   Social Worker assessment / plan:  CSW met with pt to discuss recommendation by PT for STR.  CSW also spoke to pt's daughter by phone.  Pt explained that she has lived alone since her husband passed away in Jan 29, 2013.  Pt daughter live in [place] area.  Both agree that pt is no longer able to live in her home alone.  Daughter is in the process of seeking an independent living facility for pt.  CSW explained PT's recommendations and provided a list of local SNFs.  Pt and daughter are in agreement to initiate a bed search.  Daughter expressed interested in [facility].  CSW explained that she would initiate the bed search and inform [place] of their interest.  CSW will also leave a list of ALF with pt to review per daughter's request.   Employment status:  Retired Nurse, adult PT Recommendations:  [place] / Referral to community resources:  [place]  Patient/Family's Response to care:  Pt and daughter were appreciative of CSW assistance.  Patient/Family's Understanding of and Emotional Response to Diagnosis, Current Treatment, and Prognosis:  Pt recognizes that she needs more care than she is able to provide on her own.  Pt stated she has been receiving OT and PT in the home through [facility].  Pt is interested in downsizing and is hopeful that her family will be able to find a 1 or 2 bedroom apartment to live in.  Emotional Assessment Appearance:  Appears stated age Attitude/Demeanor/Rapport:  Other (Cooperative) Affect (typically observed):  Pleasant, Appropriate, Accepting Orientation:  Oriented to Self, Oriented to Place, Oriented to  Time, Oriented to Situation Alcohol / Substance use:  Not Applicable Psych involvement (Current and /or in the community):  No (Comment)  Discharge Needs  Concerns to be addressed:    Readmission within the last 30 days:  No Current discharge risk:  Lives alone Barriers to Discharge:  Continued Medical Work up   [place], [place] D, LCSW 03/21/2015, 10:55 AM

## 2015-03-21 NOTE — Progress Notes (Signed)
Pt woke up and requested to come off BIPAP.  RN removed mask and placed on 1 LPM [place].  Pt tolerating well at this time, RT to monitor and assess as needed.

## 2015-03-21 NOTE — NC FL2 (Signed)
[place] MEDICAID FL2 LEVEL OF CARE SCREENING TOOL     IDENTIFICATION  Patient Name: Nicole Maxwell Birthdate: April 01, 1945 Sex: female Admission Date (Current Location): 03/15/2015  [facility] and [place] Number:  Producer, television/film/video and Address:  [facility],  [address] [place] [address]      Provider Number: [id number]  Attending Physician Name and Address:  Oretha Milch, MD  Relative Name and Phone Number:       Current Level of Care: Hospital Recommended Level of Care: Skilled Nursing Facility Prior Approval Number:    Date Approved/Denied:   PASRR Number: [id number]  Discharge Plan: SNF    Current Diagnoses: Patient Active Problem List   Diagnosis Date Noted  . Slow transit constipation 01/07/2015  . Acute respiratory failure (HCC)   . Normocytic anemia 11/27/2014  . Essential hypertension 11/27/2014  . Acute exacerbation of chronic obstructive pulmonary disease (COPD) (HCC) 11/26/2014  . Acute respiratory failure with hypoxia (HCC) 11/26/2014  . Leukocytosis 11/26/2014  . Physical deconditioning 07/01/2014  . Smoking 07/01/2014  . Pulmonary infiltrate in left lung on chest x-ray 04/16/2014  . DM type 2 (diabetes mellitus, type 2) (HCC) 04/16/2014  . Chronic respiratory failure (HCC) 04/16/2014  . Vitamin D deficiency disease 04/16/2014  . Pulmonary infiltrate 04/16/2014  . Esophageal dysmotility 02/27/2014  . HCAP (healthcare-associated pneumonia) 02/21/2014  . Healthcare-associated pneumonia 02/21/2014  . Abdominal pain, epigastric 01/09/2014  . Chronic diastolic congestive heart failure (HCC) 01/07/2014  . Malnutrition of moderate degree (HCC) 01/03/2014  . Acute on chronic respiratory failure (HCC) 01/03/2014  . COPD with acute exacerbation (HCC) 01/01/2014  . Hyponatremia 04/09/2012    Class: Acute  . Diabetes mellitus type 2, noninsulin dependent (HCC) 04/06/2012  . COPD exacerbation (HCC) 12/09/2011  . Preop  pulmonary/respiratory exam 02/28/2011  . HTN (hypertension) 05/12/2010  . Hyperlipidemia 05/12/2010  . Severe chronic obstructive pulmonary disease (HCC) 04/29/2010  . Chest pain 04/29/2010    Orientation RESPIRATION BLADDER Height & Weight     Self, Time, Situation, Place  O2 Continent Weight: 111 lb 15.9 oz (50.8 kg) Height:   (165.1 cm)  BEHAVIORAL SYMPTOMS/MOOD NEUROLOGICAL BOWEL NUTRITION STATUS      Continent    AMBULATORY STATUS COMMUNICATION OF NEEDS Skin   Limited Assist   Normal                       Personal Care Assistance Level of Assistance  Bathing, Dressing Bathing Assistance: Limited assistance   Dressing Assistance: Limited assistance     Functional Limitations Info             SPECIAL CARE FACTORS FREQUENCY  PT (By licensed PT)                    Contractures Contractures Info: Not present    Additional Factors Info  Allergies   Allergies Info: Brovana, Budesonide           Current Medications (03/21/2015):  This is the current hospital active medication list Current Facility-Administered Medications  Medication Dose Route Frequency Provider Last Rate Last Dose  . 0.9 %  sodium chloride infusion  250 mL Intravenous PRN Cyril Mourning V, MD      . albuterol (PROVENTIL) (2.5 MG/3ML) 0.083% nebulizer solution 2.5 mg  2.5 mg Nebulization Q2H PRN Oretha Milch, MD      . ALPRAZolam Prudy Feeler) tablet 0.5 mg  0.5 mg Oral TID PRN Brooke Pace  McQuaid, MD   0.5 mg at 03/20/15 2221  . aspirin EC tablet 81 mg  81 mg Oral Daily Leslye Peer, MD   81 mg at 03/21/15 1041  . enoxaparin (LOVENOX) injection 40 mg  40 mg Subcutaneous Q24H Karl Ito, MD   40 mg at 03/20/15 2221  . feeding supplement (ENSURE ENLIVE) (ENSURE ENLIVE) liquid 237 mL  237 mL Oral TID BM Tilda Franco, RD   237 mL at 03/20/15 2018  . feeding supplement (GLUCERNA SHAKE) (GLUCERNA SHAKE) liquid 237 mL  237 mL Oral TID BM Lupita Leash, MD   237 mL at 03/21/15 1042  .  fluticasone (FLONASE) 50 MCG/ACT nasal spray 1 spray  1 spray Each Nare BID Leslye Peer, MD   1 spray at 03/21/15 1131  . hydrALAZINE (APRESOLINE) injection 10 mg  10 mg Intravenous Q4H PRN Karl Ito, MD   10 mg at 03/18/15 0445  . insulin aspart (novoLOG) injection 0-15 Units  0-15 Units Subcutaneous 6 times per day Oretha Milch, MD   3 Units at 03/21/15 0050  . ipratropium-albuterol (DUONEB) 0.5-2.5 (3) MG/3ML nebulizer solution 3 mL  3 mL Nebulization QID Cyril Mourning V, MD   3 mL at 03/21/15 0917  . irbesartan (AVAPRO) tablet 150 mg  150 mg Oral Daily Lupita Leash, MD   150 mg at 03/21/15 1041  . methylPREDNISolone sodium succinate (SOLU-MEDROL) 40 mg/mL injection 40 mg  40 mg Intravenous Q12H Lupita Leash, MD   40 mg at 03/21/15 1610  . pantoprazole (PROTONIX) EC tablet 40 mg  40 mg Oral Q1200 Lupita Leash, MD   40 mg at 03/20/15 1228     Discharge Medications: Please see discharge summary for a list of discharge medications.  Relevant Imaging Results:  Relevant Lab Results:   Additional Information RUE:[id number]  Meyer Cory D, LCSW    This is a patient of Dr. Marchelle Gearing

## 2015-03-22 DIAGNOSIS — J9601 Acute respiratory failure with hypoxia: Secondary | ICD-10-CM | POA: Diagnosis present

## 2015-03-22 DIAGNOSIS — J9602 Acute respiratory failure with hypercapnia: Secondary | ICD-10-CM

## 2015-03-22 DIAGNOSIS — J449 Chronic obstructive pulmonary disease, unspecified: Secondary | ICD-10-CM

## 2015-03-22 LAB — GLUCOSE, CAPILLARY
GLUCOSE-CAPILLARY: 127 mg/dL — AB (ref 65–99)
GLUCOSE-CAPILLARY: 131 mg/dL — AB (ref 65–99)
GLUCOSE-CAPILLARY: 173 mg/dL — AB (ref 65–99)
GLUCOSE-CAPILLARY: 200 mg/dL — AB (ref 65–99)
GLUCOSE-CAPILLARY: 72 mg/dL (ref 65–99)
Glucose-Capillary: 105 mg/dL — ABNORMAL HIGH (ref 65–99)
Glucose-Capillary: 160 mg/dL — ABNORMAL HIGH (ref 65–99)

## 2015-03-22 LAB — CREATININE, SERUM
CREATININE: 0.78 mg/dL (ref 0.44–1.00)
GFR calc Af Amer: >60 mL/min (ref >60)

## 2015-03-22 LAB — BASIC METABOLIC PANEL: CREATININE: 0.8 mg/dL (ref 0.5–1.1)

## 2015-03-22 MED ORDER — GUAIFENESIN ER 600 MG PO TB12
1200.0000 mg | ORAL_TABLET | Freq: Two times a day (BID) | ORAL | Status: DC
  Administered 2015-03-22: 1200 mg via ORAL
  Administered 2015-03-23: 600 mg via ORAL
  Administered 2015-03-24 – 2015-03-25 (×3): 1200 mg via ORAL
  Filled 2015-03-22 (×7): qty 2

## 2015-03-22 NOTE — Progress Notes (Addendum)
PULMONARY / CRITICAL CARE MEDICINE   Name: Nicole Maxwell MRN: [id number] DOB: [birth year]    ADMISSION DATE:  03/15/2015   CHIEF COMPLAINT:  Respiratory distress  BRIEF: 59 yobf active smoker with advanced COPD admitted with hypercarbic respiratory failure due to AE COPD.  This her second severe episode requiring ICU admission in 3 months.       SUBJECTIVE:  Slept on bipap/ not sure it helped, thinks she may have smoked her last cigarette     VITAL SIGNS: BP 129/53 mmHg  Pulse 78  Temp(Src) 98.3 F (36.8 C) (Oral)  Resp 20  Ht  (1.651 m)  Wt 111 lb 15.9 oz (50.8 kg)  BMI 18.64 kg/m2  SpO2 100%  HEMODYNAMICS:    VENTILATOR SETTINGS:    INTAKE / OUTPUT: I/O last 3 completed shifts: In: 960 [P.O.:960] Out: 450 [Urine:450]  PHYSICAL EXAMINATION: Gen: sitting in chair/ rattling congested sounding cough  HENT: NCAT OP clear PULM: some wheezing bilaterally, limited air movement   CV: RRR, no mgr GI: BS+, soft, nontender MSK: normal bulk and tone Neuro: Awake and alert, oriented x4  LABS:  BMET  Recent Labs Lab 03/19/15 0359 03/19/15 1015 03/20/15 0343 03/21/15 0422 03/22/15 0418  NA 144  --  145 143  --   K 5.3* 4.4 4.6 4.6  --   CL 103  --  103 105  --   CO2 33*  --  34* 31  --   BUN 47*  --  40* 37*  --   CREATININE 0.88  --  0.80 0.62 0.78  GLUCOSE 146*  --  107* 105*  --     Electrolytes  Recent Labs Lab 03/16/15 0343 03/16/15 1944 03/17/15 0332  03/19/15 0359 03/20/15 0343 03/21/15 0422  CALCIUM 8.8*  --  9.0  < > 8.9 8.7* 8.7*  MG 1.5* 3.8* 3.4*  --   --   --   --   PHOS 3.1  --   --   --   --   --   --   < > = values in this interval not displayed.  CBC  Recent Labs Lab 03/15/15 1430 03/16/15 0343 03/17/15 0332  WBC 17.2* 17.3* 20.2*  HGB 10.3* 9.9* 10.2*  HCT 36.5 34.6* 33.9*  PLT 251 269 284    Coag's  Recent Labs Lab 03/15/15 1430  INR 0.86    Sepsis Markers  Recent Labs Lab 03/15/15 1445 03/15/15 1504   LATICACIDVEN  --  1.04  PROCALCITON <0.10  --     ABG  Recent Labs Lab 03/15/15 1446 03/17/15 2200  PHART 7.279* 7.432  PCO2ART 87.8* 54.4*  PO2ART 64.1* 81.0    Liver Enzymes No results for input(s): AST, ALT, ALKPHOS, BILITOT, ALBUMIN in the last 168 hours.  Cardiac Enzymes  Recent Labs Lab 03/15/15 1430  TROPONINI <0.03    Glucose  Recent Labs Lab 03/21/15 1153 03/21/15 1659 03/21/15 1958 03/22/15 0046 03/22/15 0443 03/22/15 0844  GLUCAP 153* 150* 131* 200* 72 105*    Imaging 2/21  pCXR 2 view> clear lungs, emphysema  STUDIES:    CULTURES: 2/18 blood >>  ANTIBIOTICS: 2/18   Vancomycin >> 2/19 2/18 cefepime >> 2/19 2/19 azithro >> 2/23   DISCUSSION: Nicole Maxwell has severe COPD and is in the hospital now with her second round in 3 months of acute respiratory failure with hypercapnea due to a COPD exacebation.  She continues to smoke cigarettes.  She has begun to  stabilize as of 2/22.  She may need  home triology vent given recurrent hospitalizations/hypercarbia.  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 respiratory failure (frequent bouts of sleepiness this hospitalization and prior hospital visit) Acute exacerbation of COPD P:   Mandatory nocturnal BIPAP > ? D/c home on Trilogy vent if she'll really use it  Titrate O2 to saturation 90-94% to minimize hypercarbia Counseled to quit smoking Continue duoneb (listed as allergic to budesonide)  Continue current dose of solumedrol 2/24, plan prednisone 2/25 Added flutter valve 02/19/15 plus mucinex   CARDIOVASCULAR A:  HTN P:  Continue aspirin Continue irbesartan  Prn hydralazine  RENAL A:   No issues      GASTROINTESTINAL A:   No issues P:   Regular diet Continue protonix  HEMATOLOGIC A:   Leukocytosis related to steroids P:  CBC intermittently  INFECTIOUS A:   AE COPD but she does not have HCAP P:   Monitor off of antibiotics  ENDOCRINE A:    Hyperglycemia steroids   P:   SSI  NEUROLOGIC A:   Acute encephalopathy-related to hypercarbia > improved Treat hypercarbia Anxiety Deconditioning P:   Continue prn Xanax   PT max mobilization    Plan d/c to home or SNF 2/27; she is considering SNF   Sandrea Hughs, MD Pulmonary and Critical Care Medicine [facility] Cell [phone] After 5:30 PM or weekends, call [phone]

## 2015-03-23 DIAGNOSIS — E43 Unspecified severe protein-calorie malnutrition: Secondary | ICD-10-CM | POA: Insufficient documentation

## 2015-03-23 LAB — GLUCOSE, CAPILLARY
GLUCOSE-CAPILLARY: 70 mg/dL (ref 65–99)
GLUCOSE-CAPILLARY: 116 mg/dL — AB (ref 65–99)
GLUCOSE-CAPILLARY: 150 mg/dL — AB (ref 65–99)
GLUCOSE-CAPILLARY: 198 mg/dL — AB (ref 65–99)
Glucose-Capillary: 154 mg/dL — ABNORMAL HIGH (ref 65–99)
Glucose-Capillary: 142 mg/dL — ABNORMAL HIGH (ref 65–99)

## 2015-03-23 MED ORDER — PREDNISONE 20 MG PO TABS
40.0000 mg | ORAL_TABLET | Freq: Two times a day (BID) | ORAL | Status: DC
  Administered 2015-03-23 – 2015-03-25 (×4): 40 mg via ORAL
  Filled 2015-03-23 (×7): qty 2

## 2015-03-23 NOTE — Progress Notes (Signed)
CSW provided pt with SNF bed offers and list of local ALF options for after rehab.  Pt and family to review offers and make choice.  CSW will continue to follow  Merlyn Lot, [facility] Clinical Social Worker [phone]

## 2015-03-23 NOTE — Progress Notes (Signed)
PULMONARY / CRITICAL CARE MEDICINE   Name: CANDYCE GAMBINO MRN: [id number] DOB: [birth year]    ADMISSION DATE:  03/15/2015   CHIEF COMPLAINT:  Respiratory distress  BRIEF: 62 yobf active smoker with advanced COPD admitted with hypercarbic respiratory failure due to AE COPD.  This her second severe episode requiring ICU admission in 3 months.       SUBJECTIVE:  Slept on bipap/ better cough mechanics today but still lots of rattling    VITAL SIGNS: BP 137/58 mmHg  Pulse 93  Temp(Src) 98.9 F (37.2 C) (Oral)  Resp 18  Ht  (1.651 m)  Wt 123 lb 10.9 oz (56.1 kg)  BMI 20.58 kg/m2  SpO2 100%  HEMODYNAMICS:    VENTILATOR SETTINGS:    INTAKE / OUTPUT: I/O last 3 completed shifts: In: 840 [P.O.:840] Out: 900 [Urine:900]   PHYSICAL EXAMINATION: Gen: sitting in chair/ rattling congested sounding cough  HENT: NCAT OP clear PULM: some wheezing bilaterally, limited air movement   CV: RRR, no mgr GI: BS+, soft, nontender MSK: normal bulk and tone Neuro: Awake and alert, oriented x4  LABS:  BMET  Recent Labs Lab 03/19/15 0359 03/19/15 1015 03/20/15 0343 03/21/15 0422 03/22/15 0418  NA 144  --  145 143  --   K 5.3* 4.4 4.6 4.6  --   CL 103  --  103 105  --   CO2 33*  --  34* 31  --   BUN 47*  --  40* 37*  --   CREATININE 0.88  --  0.80 0.62 0.78  GLUCOSE 146*  --  107* 105*  --     Electrolytes  Recent Labs Lab 03/16/15 1944 03/17/15 0332  03/19/15 0359 03/20/15 0343 03/21/15 0422  CALCIUM  --  9.0  < > 8.9 8.7* 8.7*  MG 3.8* 3.4*  --   --   --   --   < > = values in this interval not displayed.  CBC  Recent Labs Lab 03/17/15 0332  WBC 20.2*  HGB 10.2*  HCT 33.9*  PLT 284    Coag's No results for input(s): APTT, INR in the last 168 hours.  Sepsis Markers No results for input(s): LATICACIDVEN, PROCALCITON, O2SATVEN in the last 168 hours.  ABG  Recent Labs Lab 03/17/15 2200  PHART 7.432  PCO2ART 54.4*  PO2ART 81.0    Liver  Enzymes No results for input(s): AST, ALT, ALKPHOS, BILITOT, ALBUMIN in the last 168 hours.  Cardiac Enzymes No results for input(s): TROPONINI, PROBNP in the last 168 hours.  Glucose  Recent Labs Lab 03/22/15 1156 03/22/15 1536 03/22/15 2009 03/23/15 0053 03/23/15 0500 03/23/15 0840  GLUCAP 127* 173* 160* 154* 70 116*    Imaging 2/21  pCXR 2 view> clear lungs, emphysema  STUDIES:    CULTURES: 2/18 blood >> neg   ANTIBIOTICS: 2/18   Vancomycin >> 2/19 2/18 cefepime >> 2/19 2/19 azithro >> 2/23   DISCUSSION: Ms. Cichowski has severe COPD and is in the hospital now with her second round in 3 months of acute respiratory failure with hypercapnea due to a COPD exacebation.  She continues to smoke cigarettes.  She has begun to stabilize as of 2/22.  She may need  home triology vent given recurrent hospitalizations/hypercarbia or SNF/ rehab at discharge   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 respiratory failure (frequent bouts of sleepiness this hospitalization and prior hospital visit) Acute exacerbation of COPD P:   Mandatory nocturnal  BIPAP > ? D/c home on Trilogy vent if she'll really use it  Titrate O2 to saturation 90-94% to minimize hypercarbia Counseled to quit smoking Continue duoneb (listed as allergic to budesonide)   changed to po  prednisone 2/25 Added flutter valve 02/19/15 plus mucinex   CARDIOVASCULAR A:  HTN P:  Continue aspirin Continue irbesartan  Prn hydralazine  RENAL A:   No issues      GASTROINTESTINAL A:   No issues P:   Regular diet Continue protonix  HEMATOLOGIC A:   Leukocytosis related to steroids P:  CBC intermittently  INFECTIOUS A:   AE COPD but she does not have HCAP P:   Monitor off of antibiotics  ENDOCRINE A:   Hyperglycemia steroids   P:   SSI  NEUROLOGIC A:   Acute encephalopathy-related to hypercarbia > improved  Anxiety Deconditioning P:   Continue prn Xanax   PT max  mobilization    Plan d/c to home or SNF 2/27; she is considering SNF   Sandrea Hughs, MD Pulmonary and Critical Care Medicine [facility] Cell [phone] After 5:30 PM or weekends, call [phone]

## 2015-03-24 ENCOUNTER — Encounter (HOSPITAL_COMMUNITY): Payer: Self-pay | Admitting: Radiology

## 2015-03-24 ENCOUNTER — Inpatient Hospital Stay (HOSPITAL_COMMUNITY): Payer: Medicare Other

## 2015-03-24 LAB — GLUCOSE, CAPILLARY
GLUCOSE-CAPILLARY: 164 mg/dL — AB (ref 65–99)
GLUCOSE-CAPILLARY: 136 mg/dL — AB (ref 65–99)
GLUCOSE-CAPILLARY: 106 mg/dL — AB (ref 65–99)
GLUCOSE-CAPILLARY: 209 mg/dL — AB (ref 65–99)
GLUCOSE-CAPILLARY: 147 mg/dL — AB (ref 65–99)
Glucose-Capillary: 114 mg/dL — ABNORMAL HIGH (ref 65–99)
Glucose-Capillary: 230 mg/dL — ABNORMAL HIGH (ref 65–99)

## 2015-03-24 MED ORDER — UNJURY CHICKEN SOUP POWDER
8.0000 [oz_av] | Freq: Two times a day (BID) | ORAL | Status: DC
  Administered 2015-03-24 – 2015-03-25 (×2): 8 [oz_av] via ORAL
  Filled 2015-03-24 (×4): qty 27

## 2015-03-24 NOTE — Progress Notes (Signed)
PT Cancellation Note  Patient Details Name: Nicole Maxwell MRN: [id number] DOB: 1945/03/04   Cancelled Treatment:     pt was OOB earlier.  Currently resting back to bed.  Will re attempt  tomorrow.   Armando Reichert 03/24/2015, 3:49 PM

## 2015-03-24 NOTE — NC FL2 (Signed)
[place] MEDICAID FL2 LEVEL OF CARE SCREENING TOOL     IDENTIFICATION  Patient Name: Nicole Maxwell Birthdate: 06/13/1945 Sex: female Admission Date (Current Location): 03/15/2015  [facility] and [place] Number:  Producer, television/film/video and Address:  [facility],  [address] [place] [address]      Provider Number: [id number]  Attending Physician Name and Address:  Oretha Milch, MD  Relative Name and Phone Number:       Current Level of Care: Hospital Recommended Level of Care: Skilled Nursing Facility Prior Approval Number:    Date Approved/Denied:   PASRR Number: [id number]  Discharge Plan: SNF    Current Diagnoses: Patient Active Problem List   Diagnosis Date Noted  . Protein-calorie malnutrition, severe 03/23/2015  . Acute respiratory failure with hypoxia and hypercarbia (HCC) 03/22/2015  . Slow transit constipation 01/07/2015  . Normocytic anemia 11/27/2014  . Essential hypertension 11/27/2014  . Leukocytosis 11/26/2014  . Physical deconditioning 07/01/2014  . Smoking 07/01/2014  . Pulmonary infiltrate in left lung on chest x-ray 04/16/2014  . DM type 2 (diabetes mellitus, type 2) (HCC) 04/16/2014  . Chronic respiratory failure (HCC) 04/16/2014  . Vitamin D deficiency disease 04/16/2014  . Pulmonary infiltrate 04/16/2014  . Esophageal dysmotility 02/27/2014  . HCAP (healthcare-associated pneumonia) 02/21/2014  . Healthcare-associated pneumonia 02/21/2014  . Abdominal pain, epigastric 01/09/2014  . Chronic diastolic congestive heart failure (HCC) 01/07/2014  . Malnutrition of moderate degree (HCC) 01/03/2014  . Acute on chronic respiratory failure (HCC) 01/03/2014  . COPD with acute exacerbation (HCC) 01/01/2014  . Hyponatremia 04/09/2012    Class: Acute  . Diabetes mellitus type 2, noninsulin dependent (HCC) 04/06/2012  . COPD exacerbation (HCC) 12/09/2011  . Preop pulmonary/respiratory exam 02/28/2011  . HTN (hypertension) 05/12/2010  .  Hyperlipidemia 05/12/2010  . Severe chronic obstructive pulmonary disease (HCC) 04/29/2010  . Chest pain 04/29/2010    Orientation RESPIRATION BLADDER Height & Weight     Self, Time, Situation, Place  O2 Continent Weight: 58.4 kg (128 lb 12 oz) Height:   (165.1 cm)  BEHAVIORAL SYMPTOMS/MOOD NEUROLOGICAL BOWEL NUTRITION STATUS      Continent    AMBULATORY STATUS COMMUNICATION OF NEEDS Skin   Limited Assist   Normal                       Personal Care Assistance Level of Assistance  Bathing, Dressing Bathing Assistance: Limited assistance   Dressing Assistance: Limited assistance     Functional Limitations Info             SPECIAL CARE FACTORS FREQUENCY  PT (By licensed PT)                    Contractures Contractures Info: Not present    Additional Factors Info  Allergies   Allergies Info: Brovana, Budesonide           Current Medications (03/24/2015):  This is the current hospital active medication list Current Facility-Administered Medications  Medication Dose Route Frequency Provider Last Rate Last Dose  . 0.9 %  sodium chloride infusion  250 mL Intravenous PRN Cyril Mourning V, MD      . albuterol (PROVENTIL) (2.5 MG/3ML) 0.083% nebulizer solution 2.5 mg  2.5 mg Nebulization Q2H PRN Oretha Milch, MD      . ALPRAZolam Prudy Feeler) tablet 0.5 mg  0.5 mg Oral TID PRN Lupita Leash, MD   0.5 mg at 03/23/15 1458  .  aspirin EC tablet 81 mg  81 mg Oral Daily Leslye Peer, MD   81 mg at 03/24/15 1610  . enoxaparin (LOVENOX) injection 40 mg  40 mg Subcutaneous Q24H Karl Ito, MD   40 mg at 03/23/15 2300  . feeding supplement (ENSURE ENLIVE) (ENSURE ENLIVE) liquid 237 mL  237 mL Oral TID BM Tilda Franco, RD   237 mL at 03/24/15 1000  . feeding supplement (GLUCERNA SHAKE) (GLUCERNA SHAKE) liquid 237 mL  237 mL Oral TID BM Lupita Leash, MD   237 mL at 03/24/15 1000  . fluticasone (FLONASE) 50 MCG/ACT nasal spray 1 spray  1 spray Each Nare BID  Leslye Peer, MD   1 spray at 03/23/15 2103  . guaiFENesin (MUCINEX) 12 hr tablet 1,200 mg  1,200 mg Oral BID Nyoka Cowden, MD   1,200 mg at 03/24/15 0905  . hydrALAZINE (APRESOLINE) injection 10 mg  10 mg Intravenous Q4H PRN Karl Ito, MD   10 mg at 03/18/15 0445  . insulin aspart (novoLOG) injection 0-15 Units  0-15 Units Subcutaneous 6 times per day Oretha Milch, MD   2 Units at 03/24/15 0439  . ipratropium-albuterol (DUONEB) 0.5-2.5 (3) MG/3ML nebulizer solution 3 mL  3 mL Nebulization QID Cyril Mourning V, MD   3 mL at 03/24/15 0910  . irbesartan (AVAPRO) tablet 150 mg  150 mg Oral Daily Lupita Leash, MD   150 mg at 03/24/15 0905  . pantoprazole (PROTONIX) EC tablet 40 mg  40 mg Oral Q1200 Lupita Leash, MD   40 mg at 03/23/15 1039  . predniSONE (DELTASONE) tablet 40 mg  40 mg Oral BID WC Nyoka Cowden, MD   40 mg at 03/24/15 9604     Discharge Medications: Please see discharge summary for a list of discharge medications.  Relevant Imaging Results:  Relevant Lab Results:   Additional Information [id number]  Mandy Fitzwater, Dickey Gave, LCSW

## 2015-03-24 NOTE — Progress Notes (Signed)
Nutrition Follow-up  DOCUMENTATION CODES:   Severe malnutrition in context of chronic illness, Underweight  INTERVENTION:   -D/c Ensure Enlive -D/c Glucerna Shakes -Provide Unjury Chicken Soup supplement BID- each packet provides 100 kcal and 21g protein -Provide daily snack  -Encourage PO intake -RD to continue to monitor  NUTRITION DIAGNOSIS:   Malnutrition related to poor appetite, chronic illness as evidenced by percent weight loss, moderate depletion of body fat, moderate depletions of muscle mass, energy intake < or equal to 50% for > or equal to 1 month.  Ongoing.  GOAL:   Patient will meet greater than or equal to 90% of their needs  Progressing.  MONITOR:   PO intake, Supplement acceptance, Labs  ASSESSMENT:   This is the second admission in the last 3 months with severe COPD. Pt has PMH of Hypertension; Osteopenia; PVD; Tobacco abuse; Vitamin D deficiency disease; Alcohol abuse, in remission (quit 2004); Shortness of breath; Asthma; Hyperlipidemia; On home oxygen therapy; COPD;Type II diabetes mellitus; and Arthritis.  Patient in room with no family at bedside. Pt reports improved appetite but still not eating as well as she should. She states she feels hungry but once ready to eat, she doesn't eat much. PO intake documented at 50-100% yesterday. This morning she had a boiled egg. Pt did not like the green beans that have come on her trays.  Patient states that she does not like Ensure or Glucerna shakes d/t the sweetness. She requests that I d/c those orders. Pt would rather have chocolate milk or milk mixed with chocolate ice cream to make milkshakes. Pt is willing to try Unjury chicken soup supplements as well since she is tired of sweet supplements. RD to order.  Medications reviewed. Labs reviewed: CBGs: 114-164  Diet Order:  Diet regular Room service appropriate?: Yes; Fluid consistency:: Thin  Skin:  Reviewed, no issues  Last BM:  2/26  Height:   Ht  Readings from Last 1 Encounters:  03/15/15  (1.651 m)    Weight:   Wt Readings from Last 1 Encounters:  03/24/15 128 lb 12 oz (58.4 kg)    Ideal Body Weight:  56.8 kg  BMI:  Body mass index is 21.42 kg/(m^2).  Estimated Nutritional Needs:   Kcal:  1600-1800  Protein:  75-85g   Fluid:  1.6-1.8L/day  EDUCATION NEEDS:   No education needs identified at this time  Tilda Franco, MS, RD, LDN Pager: [phone] After Hours Pager: [phone]

## 2015-03-24 NOTE — Progress Notes (Signed)
PULMONARY / CRITICAL CARE MEDICINE   Name: Nicole Maxwell MRN: [id number] DOB: [birth year]    ADMISSION DATE:  03/15/2015   CHIEF COMPLAINT:  Respiratory distress  BRIEF: 78 yobf active smoker with advanced COPD admitted with hypercarbic respiratory failure due to AE COPD.  This her second severe episode requiring ICU admission in 3 months.        STUDIES:    CULTURES: 2/18 blood >>  ANTIBIOTICS: 2/18   Vancomycin >> 2/19 2/18 cefepime >> 2/19 2/19 azithro >> 2/23   EVENTS 2/26  - Slept on bipap/ not sure it helped, thinks she may have smoked her last cigarette     SUBJECTIVE/OVERNIGHT/INTERVAL HX 03/24/15 - c/o cough. Says she has been told is smoker cough but disagrees. Cough is rattly. SNF has a bed but she feels she might not be ready today. Agrees she is very deconditioned. Lives alone. Family visits. Seems to be open for home based palliative care   VITAL SIGNS: BP 113/42 mmHg  Pulse 89  Temp(Src) 98.7 F (37.1 C) (Oral)  Resp 19  Ht  (1.651 m)  Wt 58.4 kg (128 lb 12 oz)  BMI 21.42 kg/m2  SpO2 91%  HEMODYNAMICS:    VENTILATOR SETTINGS:    INTAKE / OUTPUT: I/O last 3 completed shifts: In: 1320 [P.O.:1320] Out: 400 [Urine:400]  PHYSICAL EXAMINATION: Gen: sitting in side of bed/ rattling congested sounding cough , VERY DECONDITIONED COMPARED TO WHAT I KNOW OF HER SEVERA MONTHS AGO HENT: NCAT OP clear PULM: limited air movement  But no wheeze or distress but gets dyspneic and purse lips on talking CV: RRR, no mgr GI: BS+, soft, nontender MSK: weak. Has walker Neuro: Awake and alert, oriented x4  LABS: PULMONARY  Recent Labs Lab 03/17/15 2200  PHART 7.432  PCO2ART 54.4*  PO2ART 81.0  HCO3 35.9*  TCO2 33.2  O2SAT 95.3    CBC No results for input(s): HGB, HCT, WBC, PLT in the last 168 hours.  COAGULATION No results for input(s): INR in the last 168 hours.  CARDIAC  No results for input(s): TROPONINI in the last 168 hours. No  results for input(s): PROBNP in the last 168 hours.   CHEMISTRY  Recent Labs Lab 03/18/15 0325 03/19/15 0359  03/20/15 0343 03/21/15 0422 03/22/15 0418  NA 147* 144  --  145 143  --   K 4.8 5.3*  < > 4.6 4.6  --   CL 101 103  --  103 105  --   CO2 32 33*  --  34* 31  --   GLUCOSE 120* 146*  --  107* 105*  --   BUN 41* 47*  --  40* 37*  --   CREATININE 0.80 0.88  --  0.80 0.62 0.78  CALCIUM 9.4 8.9  --  8.7* 8.7*  --   < > = values in this interval not displayed. Estimated Creatinine Clearance: 58.9 mL/min (by C-G formula based on Cr of 0.78).   LIVER No results for input(s): AST, ALT, ALKPHOS, BILITOT, PROT, ALBUMIN, INR in the last 168 hours.   INFECTIOUS No results for input(s): LATICACIDVEN, PROCALCITON in the last 168 hours.   ENDOCRINE CBG (last 3)   Recent Labs  03/23/15 2327 03/24/15 0338 03/24/15 0816  GLUCAP 164* 136* 114*         IMAGING x48h  - image(s) personally visualized  -   highlighted in bold No results found.      DISCUSSION: [name] has  severe COPD and is in the hospital now with her second round in 3 months of acute respiratory failure with hypercapnea due to a COPD exacebation.  She continues to smoke cigarettes.  She has begun to stabilize as of 2/22.  She may need  home triology vent given recurrent hospitalizations/hypercarbia.  ASSESSMENT / PLAN:  PULMONARY A: #baseline   - gold stage 4 copd - 33% fev1 in 2013 - o2 dependent  - ongoing smoking - recurrent AECOPD - 2 hospitalizaton sinc November - significant physical deconditioning deterioration since nov 2016 - baseline EF on echo 50% in 2015  # #current - 03/15/2015 Acute on chronic hypercarbic respiratory failure (frequent bouts of sleepiness this hospitalization and prior hospital visit) Acute exacerbation of COPD    - slow to resolve. Crackling cough +. Physical deconditioning +   P:   Get CT chest (last 2016) -ensure no cancer that can change  prognosis Get ECHO 0- rule out CHF that can alter Rx Mandatory nocturnal BIPAP > ? D/c home on Trilogy vent if she'll really use it  Titrate O2 to saturation 90-94% to minimize hypercarbia Counseled to quit smoking Continue duoneb (listed as allergic to budesonide)  Continue current dose of solumedrol 2/24, plan prednisone 2/25 Added flutter valve 02/19/15 plus mucinex     CARDIOVASCULAR A:  HTN P:  Continue aspirin Continue irbesartan  Prn hydralazine Get echo  RENAL A:   No issues      GASTROINTESTINAL A:   No issues P:   Regular diet Continue protonix  HEMATOLOGIC A:   Leukocytosis related to steroids P:  CBC intermittently  INFECTIOUS A:   AE COPD but she does not have HCAP P:   Monitor off of antibiotics  ENDOCRINE A:   Hyperglycemia steroids   P:   SSI  NEUROLOGIC A:   Acute encephalopathy-related to hypercarbia > improved Treat hypercarbia Anxiety Deconditioning   - normal mental status 03/24/2015  P:   Continue prn Xanax   PT max mobilization    DC SNF [facility] 03/25/15 afte echo and CT 03/24/15. - if they are ok. At home consider home palliative care - she is willing     Dr. Kalman Shan, M.D., [facility].C.P Pulmonary and Critical Care Medicine Staff Physician  System [place] Pulmonary and Critical Care Pager: [phone], If no answer or between  15:00h - 7:00h: call [phone]  03/24/2015 10:06 AM

## 2015-03-25 ENCOUNTER — Other Ambulatory Visit: Payer: Self-pay | Admitting: Acute Care

## 2015-03-25 ENCOUNTER — Inpatient Hospital Stay (HOSPITAL_COMMUNITY): Payer: Medicare Other

## 2015-03-25 DIAGNOSIS — R0600 Dyspnea, unspecified: Secondary | ICD-10-CM

## 2015-03-25 DIAGNOSIS — J80 Acute respiratory distress syndrome: Secondary | ICD-10-CM

## 2015-03-25 LAB — GLUCOSE, CAPILLARY
GLUCOSE-CAPILLARY: 105 mg/dL — AB (ref 65–99)
Glucose-Capillary: 90 mg/dL (ref 65–99)
Glucose-Capillary: 102 mg/dL — ABNORMAL HIGH (ref 65–99)

## 2015-03-25 MED ORDER — FUROSEMIDE 40 MG PO TABS: 40.0000 mg | ORAL_TABLET | Freq: Every day | ORAL | Status: DC

## 2015-03-25 MED ORDER — IPRATROPIUM-ALBUTEROL 0.5-2.5 (3) MG/3ML IN SOLN: 3.0000 mL | Freq: Four times a day (QID) | RESPIRATORY_TRACT | Status: DC

## 2015-03-25 MED ORDER — PREDNISONE 10 MG PO TABS: ORAL_TABLET | ORAL | Status: DC

## 2015-03-25 MED ORDER — UNJURY CHICKEN SOUP POWDER: 8.0000 [oz_av] | Freq: Two times a day (BID) | ORAL | Status: DC

## 2015-03-25 MED ORDER — ALPRAZOLAM 2 MG PO TABS
1.0000 mg | ORAL_TABLET | Freq: Three times a day (TID) | ORAL | Status: DC | PRN
Start: 2015-03-25 — End: 2015-03-25

## 2015-03-25 MED ORDER — ALPRAZOLAM 2 MG PO TABS: 1.0000 mg | ORAL_TABLET | Freq: Three times a day (TID) | ORAL | Status: DC | PRN

## 2015-03-25 MED ORDER — PREDNISONE 20 MG PO TABS: 40.0000 mg | ORAL_TABLET | Freq: Two times a day (BID) | ORAL | Status: DC

## 2015-03-25 MED ORDER — PREDNISONE 1 MG PO TABS: ORAL_TABLET | ORAL | Status: DC

## 2015-03-25 NOTE — Care Management Important Message (Signed)
Important Message  Patient Details  Name: TRINIDY MASTERSON MRN: [id number] Date of Birth: January 17, 1946   Medicare Important Message Given:  Yes    Haskell Flirt 03/25/2015, 1:51 PMImportant Message  Patient Details  Name: AANCHAL COPE MRN: [id number] Date of Birth: 10-24-1945   Medicare Important Message Given:  Yes    Haskell Flirt 03/25/2015, 1:50 PM

## 2015-03-25 NOTE — Progress Notes (Signed)
PULMONARY / CRITICAL CARE MEDICINE   Name: Nicole Maxwell MRN: [id number] DOB: [birth year]    ADMISSION DATE:  03/15/2015   CHIEF COMPLAINT:  Respiratory distress  BRIEF: 39 yobf active smoker with advanced COPD admitted with hypercarbic respiratory failure due to AE COPD.  This her second severe episode requiring ICU admission in 3 months.        STUDIES:    CULTURES: 2/18 blood >>  ANTIBIOTICS: 2/18   Vancomycin >> 2/19 2/18 cefepime >> 2/19 2/19 azithro >> 2/23   EVENTS 2/26  - Slept on bipap/ not sure it helped, thinks she may have smoked her last cigarette    03/24/15 - c/o cough. Says she has been told is smoker cough but disagrees. Cough is rattly. SNF has a bed but she feels she might not be ready today. Agrees she is very deconditioned. Lives alone. Family visits. Seems to be open for home based palliative care    SUBJECTIVE/OVERNIGHT/INTERVAL HX 03/25/2015 - restful night. stil with cough. CT with small LLL Pna. No cancer. ECHO going to start now (baseline dec 2015  - Left ventricle: Hypokinesis mid inferolateral segment. Severe hypokinesis base/mid inferior segments. The cavity size was normal. Wall thickness was normal. The estimated ejection fraction was 50%). Last stress test 1998  Also uncl;ear if she wants to switch from MR to PM - there is no evidence that she has asked for switch but she reports she "likes PM"  VITAL SIGNS: BP 118/95 mmHg  Pulse 81  Temp(Src) 98.1 F (36.7 C) (Oral)  Resp 19  Ht  (1.651 m)  Wt 57.1 kg (125 lb 14.1 oz)  BMI 20.95 kg/m2  SpO2 100%  HEMODYNAMICS:    VENTILATOR SETTINGS:    INTAKE / OUTPUT: I/O last 3 completed shifts: In: 1080 [P.O.:1080] Out: -   PHYSICAL EXAMINATION: Gen: lying in  bed/ rattling congested sounding cough , VERY DECONDITIONED COMPARED TO WHAT I KNOW OF HER SEVERA MONTHS AGO HENT: NCAT OP clear PULM: limited air movement  But no wheeze or distress but gets dyspneic and purse lips on  talking CV: RRR, no mgr GI: BS+, soft, nontender MSK: weak. Has walker Neuro: Awake and alert, oriented x4  LABS: PULMONARY No results for input(s): PHART, PCO2ART, PO2ART, HCO3, TCO2, O2SAT in the last 168 hours.  Invalid input(s): PCO2, PO2  CBC No results for input(s): HGB, HCT, WBC, PLT in the last 168 hours.  COAGULATION No results for input(s): INR in the last 168 hours.  CARDIAC  No results for input(s): TROPONINI in the last 168 hours. No results for input(s): PROBNP in the last 168 hours.   CHEMISTRY  Recent Labs Lab 03/19/15 0359  03/20/15 0343 03/21/15 0422 03/22/15 0418  NA 144  --  145 143  --   K 5.3*  < > 4.6 4.6  --   CL 103  --  103 105  --   CO2 33*  --  34* 31  --   GLUCOSE 146*  --  107* 105*  --   BUN 47*  --  40* 37*  --   CREATININE 0.88  --  0.80 0.62 0.78  CALCIUM 8.9  --  8.7* 8.7*  --   < > = values in this interval not displayed. Estimated Creatinine Clearance: 58.9 mL/min (by C-G formula based on Cr of 0.78).   LIVER No results for input(s): AST, ALT, ALKPHOS, BILITOT, PROT, ALBUMIN, INR in the last 168 hours.   INFECTIOUS  No results for input(s): LATICACIDVEN, PROCALCITON in the last 168 hours.   ENDOCRINE CBG (last 3)   Recent Labs  03/24/15 1951 03/24/15 2352 03/25/15 0354  GLUCAP 147* 230* 105*         IMAGING x48h  - image(s) personally visualized  -   highlighted in bold Ct Chest Wo Contrast  03/24/2015  CLINICAL DATA:  Cough and shortness of breath for several months. Recent pneumonia. EXAM: CT CHEST WITHOUT CONTRAST TECHNIQUE: Multidetector CT imaging of the chest was performed following the standard protocol without IV contrast. COMPARISON:  03/22/2014 FINDINGS: Mediastinum / Lymph Nodes: There is no axillary lymphadenopathy. No mediastinal lymphadenopathy. There is no hilar lymphadenopathy. The heart size is normal. No pericardial effusion. Coronary artery calcification is noted. The esophagus has normal  imaging features. Lungs / Pleura: Moderate changes of emphysema noted. No focal airspace consolidation. No suspicious pulmonary nodule or mass. Atelectasis noted in the posterior left lung base with associated small airway impaction. No evidence of pleural effusion. Upper Abdomen: 11 mm low-density lesion upper pole left kidney was present on the previous study measures water attenuation today. Although this cannot be definitively characterize, is likely a cyst. MSK / Soft Tissues: Bone windows reveal no worrisome lytic or sclerotic osseous lesions. IMPRESSION: 1. Left lower lobe small airway impaction with posterior left lower lobe mild collapse/consolidation. Imaging features may be related to atypical infection. Aspiration is more typically seen in the right lung. 2. Emphysema. 3. Atherosclerosis. Electronically Signed   By: Kennith Center M.D.   On: 03/24/2015 11:47        DISCUSSION: Ms. Nodal has severe COPD and is in the hospital now with her second round in 3 months of acute respiratory failure with hypercapnea due to a COPD exacebation.  She continues to smoke cigarettes.  She has begun to stabilize as of 2/22.  She may need  home triology vent given recurrent hospitalizations/hypercarbia.  ASSESSMENT / PLAN:  PULMONARY A: #baseline   - gold stage 4 copd - 33% fev1 in 2013 - o2 dependent  - ongoing smoking - recurrent AECOPD - 2 hospitalizaton sinc November - significant physical deconditioning deterioration since nov 2016 - baseline EF on echo 50% in 2015 with focal hypokinesis  (last stress test 1998)  # #current - 03/15/2015 Acute on chronic hypercarbic respiratory failure (frequent bouts of sleepiness this hospitalization and prior hospital visit) Acute exacerbation of COPD with small LLL Pna on CT 03/24/15    - slow to resolve. Crackling cough +. Physical deconditioning +. Wore bipap most o fnight   P:   Mandatory nocturnal BIPAP > ? D/c home on Trilogy vent if she'll really  use it  Titrate O2 to saturation 90-94% to minimize hypercarbia Counseled to quit smoking Continue duoneb (listed as allergic to budesonide)  Prednisone - 2 week taper OPD start darilesp to prevent AECOPD Added flutter valve 02/19/15 plus mucinex     CARDIOVASCULAR A:  HTN Focal wall motion abn dec 2015 echo  PLAN Awaith ECHO result 03/25/2015 - clinically euvolemic - can go home -> opd cards eval needed  Continue aspirin Continue irbesartan  Prn hydralazine   RENAL A:   No issues      GASTROINTESTINAL A:   No issues P:   Regular diet Continue protonix  HEMATOLOGIC A:   Leukocytosis related to steroids P:  CBC intermittently  INFECTIOUS A:   Small LLL PNA on cT 03/24/15 - so likely HCAP  - currently afebrile P:  Monitor off of antibiotics  ENDOCRINE A:   Hyperglycemia steroids   P:   SSI  NEUROLOGIC A:   Acute encephalopathy-related to hypercarbia > improved Treat hypercarbia Anxiety Deconditioning   - normal mental status 03/25/2015  P:   Continue prn Xanax   PT max mobilization    MSK A severe physical deconditioning  P DC SNF [facility] 03/25/15 afte echo if is ok.  At home consider home palliative care - she is willing   OPD FU  - unclear if she wants to switch from MR to PM; APP to clarify and sort out fu accordingly     Dr. Kalman Shan, M.D., [facility]C.P Pulmonary and Critical Care Medicine Staff Physician [facility] [place] [facility] Pager: [phone], If no answer or between  15:00h - 7:00h: call [phone]  03/25/2015 8:28 AM

## 2015-03-25 NOTE — Progress Notes (Signed)
Physical Therapy Treatment Patient Details Name: ABYGALE KARPF MRN: [id number] DOB: [birth year] Today's Date: 03/25/2015    History of Present Illness 70 y/o female with advanced COPD admitted 2/18 with hypercarbic respiratory failure due to  COPD. This her second severe episode requiring ICU admission in 3 months.     PT Comments    Pt feeling better.  Only agreed to amb to and from bathroom this session.  VC's on environmental safety and walker use throughout turns.  Muild c/o dyspnea with avg sats 96% remained on O2.  Pt plans to D/C to SNF for ST Rehab.   Follow Up Recommendations  SNF     Equipment Recommendations       Recommendations for Other Services       Precautions / Restrictions Precautions Precaution Comments: monitor sats/HR Restrictions Weight Bearing Restrictions: No    Mobility  Bed Mobility               General bed mobility comments: in recliner  Transfers Overall transfer level: Needs assistance Equipment used: Rolling walker (2 wheeled) Transfers: Sit to/from Stand Sit to Stand: Supervision         General transfer comment: 25% VC's on environmental safety (bed side table/O2 tubing)  Ambulation/Gait Ambulation/Gait assistance: Supervision Ambulation Distance (Feet): 22 Feet     Gait velocity: WFL   General Gait Details: amb to and from bathroom anly per pt request.  Remained on O2 avg sats 96%.  Mild unsteady with mild c/o dyspnea requiring seated rest break.    Stairs            Wheelchair Mobility    Modified Rankin (Stroke Patients Only)       Balance                                    Cognition Arousal/Alertness: Awake/alert Behavior During Therapy: Anxious;WFL for tasks assessed/performed Overall Cognitive Status: Within Functional Limits for tasks assessed                      Exercises      General Comments        Pertinent Vitals/Pain Pain Assessment: No/denies pain     Home Living                      Prior Function            PT Goals (current goals can now be found in the care plan section) Progress towards PT goals: Progressing toward goals    Frequency  Min 3X/week    PT Plan      Co-evaluation             End of Session Equipment Utilized During Treatment: Gait belt Activity Tolerance: Patient tolerated treatment well Patient left: in bed;with call bell/phone within reach     Time: 1055-1105 PT Time Calculation (min) (ACUTE ONLY): 10 min  Charges:  $Gait Training: 8-22 mins                    G Codes:      Felecia Shelling  PTA [facility]  Acute  Rehab Pager      [phone]

## 2015-03-25 NOTE — Clinical Social Work Placement (Signed)
   CLINICAL SOCIAL WORK PLACEMENT  NOTE  Date:  03/25/2015  Patient Details  Name: Nicole Maxwell MRN: [id number] Date of Birth: 08-12-1945  Clinical Social Work is seeking post-discharge placement for this patient at the Skilled  Nursing Facility level of care (*CSW will initial, date and re-position this form in  chart as items are completed):  Yes   Patient/family provided with [place] Clinical Social Work Department's list of facilities offering this level of care within the geographic area requested by the patient (or if unable, by the patient's family).  Yes   Patient/family informed of their freedom to choose among providers that offer the needed level of care, that participate in Medicare, Medicaid or managed care program needed by the patient, have an available bed and are willing to accept the patient.  Yes   Patient/family informed of [facility]'s ownership interest in [facility] and [facility], as well as of the fact that they are under no obligation to receive care at these facilities.  PASRR submitted to EDS on 03/21/15     PASRR number received on 03/21/15     Existing PASRR number confirmed on       FL2 transmitted to all facilities in geographic area requested by pt/family on 03/21/15     FL2 transmitted to all facilities within larger geographic area on       Patient informed that his/her managed care company has contracts with or will negotiate with certain facilities, including the following:        Yes   Patient/family informed of bed offers received.  Patient chooses bed at [facility]     Physician recommends and patient chooses bed at      Patient to be transferred to [facility] on 03/25/15.  Patient to be transferred to facility by [facility]     Patient family notified on 03/25/15 of transfer.  Name of family member notified:  DAUGHTER     PHYSICIAN       Additional Comment: Pt / daughter are in agreement with d/c to [facility]  today. [facility] transport is required. Medical necessity form completed. Pt / daughter are aware out of pocket costs may be associated with [facility] transport. D/C Summary sent to SNF for review prior to d/c. Scripts included in d/c packet. # for report provided to nsg.   _______________________________________________ Royetta Asal, LCSW  [phone] 03/25/2015, 12:01 PM

## 2015-03-25 NOTE — Progress Notes (Signed)
Echocardiogram 2D Echocardiogram has been performed.  Nicole Maxwell 03/25/2015, 9:36 AM

## 2015-03-25 NOTE — Discharge Summary (Signed)
Physician Discharge Summary       Patient ID: Nicole Maxwell MRN: [id number] DOB/AGE: March 21, 1945 70 y.o.  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 Marchelle Gearing. Spirometry in January 2013 showed FEV1 of 0.74/37%. She is maintained on a regimen of Symbicort and Spiriva as outpatient.She was hospitalized 11/2014 with acute exacerbation of COPD in the setting of rhinovirus and pansensitive pseudomonas. She required mechanical ventilation, she was discharged to a rehabilitation facility and quit smoking but relapsed after getting back home. She was also started on Lasix for pedal edema. She saw her PCP last week and was given Cefdinir and prednisone. However she became increasingly lethargic and in respiratory distress and daughter brought her in, noted to have respiratory acidosis on ABG and placed on BiPAP in the ED. She was admitted to the SDU. Treated empirically w/ IV antibiotics, systemic steroids, scheduled BDs and supportive care. She made slow but steady improvement over the course of her stay. We eventually transitioned her to nocturnal BIPAP and felt that she should continue nocturnal ventilation at HS given degree of deconditioning and severity of her underlying lung disease. Her antibiotics were completed on 2/23 (see total list of abx below). She was monitored over the following days to ensure improvement. We felt she was stable for d/c to SNF as of 2/28 with the plan as outlined below.    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 - ongoing smoking Plan Mandatory nocturnal BIPAP  Settings:  - mask of choice  - Humidification  - Oxygen 2 liters  - IPAP 12 cmH2O  - EPAP 6 cm H2) Counseled to quit smoking Continue duoneb (listed as allergic to budesonide)  Prednisone - 2 week taper start darilesp to prevent AECOPD Added flutter valve 02/19/15 plus mucinex  Hospital f/u: Rubye Oaks NP 3/14 at 1115 F/u with Dr Marchelle Gearing on: April 3rd at 1030 am   HTN Focal wall motion abn dec 2015 echo-->nevere worked up [employer] - out-pt cards consult: Dr Eldridge Dace; march 13th 1130 am  - ECHO completed 03/25/2015-->can be followed at cards appointment  - Continue aspirin - Continue irbesartan   Hyperglycemia steroids  Plan:  Resume metformin   Anxiety  Plan:  -Continue prn Xanax   Severe physical deconditioning Plan -PT and OT at [facility]    Significant Hospital tests/ studies  Consults: none  CULTURES: 2/18 blood >> negative   ANTIBIOTICS: 2/18 Vancomycin >> 2/19 2/18 cefepime >> 2/19 2/19 azithro >> 2/23  Discharge Exam: BP 129/50 mmHg  Pulse 84  Temp(Src) 98.1 F (36.7 C) (Oral)  Resp 19  Ht  (1.651 m)  Wt 125 lb 14.1 oz (57.1 kg)  BMI 20.95 kg/m2  SpO2 96% Oxygen at 2 liters  Gen: lying in bed/ rattling congested sounding cough , VERY DECONDITIONED COMPARED TO WHAT I  KNOW OF HER SEVERAL MONTHS AGO HENT: NCAT OP clear PULM: limited air movement But no wheeze or distress but gets dyspneic and purse lips on talking CV: RRR, no mgr GI: BS+, soft, nontender MSK: weak. Has walker Neuro: Awake and alert, oriented x4  Labs at discharge Lab Results  Component Value Date   CREATININE 0.78 03/22/2015   BUN 37* 03/21/2015   NA 143  03/21/2015   K 4.6 03/21/2015   CL 105 03/21/2015   CO2 31 03/21/2015   Lab Results  Component Value Date   WBC 20.2* 03/17/2015   HGB 10.2* 03/17/2015   HCT 33.9* 03/17/2015   MCV 87.8 03/17/2015   PLT 284 03/17/2015   Lab Results  Component Value Date   ALT 24 12/01/2014   AST 19 12/01/2014   ALKPHOS 53 12/01/2014   BILITOT 0.6 12/01/2014   Lab Results  Component Value Date   INR 0.86 03/15/2015   INR 0.91 12/01/2014   INR 0.93 11/26/2014    Current radiology studies Ct Chest Wo Contrast  03/24/2015  CLINICAL DATA:  Cough and shortness of breath for several months. Recent pneumonia. EXAM: CT CHEST WITHOUT CONTRAST TECHNIQUE: Multidetector CT imaging of the chest was performed following the standard protocol without IV contrast. COMPARISON:  03/22/2014 FINDINGS: Mediastinum / Lymph Nodes: There is no axillary lymphadenopathy. No mediastinal lymphadenopathy. There is no hilar lymphadenopathy. The heart size is normal. No pericardial effusion. Coronary artery calcification is noted. The esophagus has normal imaging features. Lungs / Pleura: Moderate changes of emphysema noted. No focal airspace consolidation. No suspicious pulmonary nodule or mass. Atelectasis noted in the posterior left lung base with associated small airway impaction. No evidence of pleural effusion. Upper Abdomen: 11 mm low-density lesion upper pole left kidney was present on the previous study measures water attenuation today. Although this cannot be definitively characterize, is likely a cyst. MSK / Soft Tissues: Bone windows reveal no worrisome lytic or sclerotic osseous lesions. IMPRESSION: 1. Left lower lobe small airway impaction with posterior left lower lobe mild collapse/consolidation. Imaging features may be related to atypical infection. Aspiration is more typically seen in the right lung. 2. Emphysema. 3. Atherosclerosis. Electronically Signed   By: Kennith Center M.D.   On: 03/24/2015 11:47     Disposition:  03-Skilled Nursing Facility      Discharge Instructions    Diet - low sodium heart healthy    Complete by:  As directed      Increase activity slowly    Complete by:  As directed             Medication List    STOP taking these medications        budesonide-formoterol 160-4.5 MCG/ACT inhaler  Commonly known as:  SYMBICORT     cefdinir 300 MG capsule  Commonly known as:  OMNICEF     HYDROcodone-acetaminophen 5-325 MG tablet  Commonly known as:  NORCO/VICODIN     tiotropium 18 MCG inhalation capsule  Commonly known as:  SPIRIVA      TAKE these medications        acetaminophen 500 MG tablet  Commonly known as:  TYLENOL  Take 500-1,000 mg by mouth every 8 (eight) hours as needed (for pain).     albuterol 108 (90 Base) MCG/ACT inhaler  Commonly known as:  PROVENTIL HFA;VENTOLIN HFA  Inhale 2 puffs into the lungs every 4 (four) hours as needed for wheezing or shortness of breath.     albuterol (2.5 MG/3ML)  0.083% nebulizer solution  Commonly known as:  PROVENTIL  Take 3 mLs (2.5 mg total) by nebulization every 4 (four) hours as needed for wheezing or shortness of breath. (may take 1 every 4 hours as needed for wheezing/shortness of breath) DX: 496     alprazolam 2 MG tablet  Commonly known as:  XANAX  Take 0.5 tablets (1 mg total) by mouth 3 (three) times daily as needed for anxiety.     aspirin 81 MG EC tablet  Take 1 tablet (81 mg total) by mouth daily.     atorvastatin 20 MG tablet  Commonly known as:  LIPITOR  Take 20 mg by mouth daily. Take 1 tablet daily     dextromethorphan-guaiFENesin 30-600 MG 12hr tablet  Commonly known as:  MUCINEX DM  Take 1 tablet by mouth 2 (two) times daily.     fluticasone 50 MCG/ACT nasal spray  Commonly known as:  FLONASE  Place 1 spray into both nostrils 2 (two) times daily.     furosemide 40 MG tablet  Commonly known as:  LASIX  Take 1 tablet (40 mg total) by mouth daily. Take 40 mg in the AM and 20  mg in the PM     guaiFENesin 600 MG 12 hr tablet  Commonly known as:  MUCINEX  Take 2 tablets (1,200 mg total) by mouth 2 (two) times daily.     ipratropium-albuterol 0.5-2.5 (3) MG/3ML Soln  Commonly known as:  DUONEB  Take 3 mLs by nebulization 4 (four) times daily.     irbesartan 150 MG tablet  Commonly known as:  AVAPRO  Take 1 tablet (150 mg total) by mouth daily.     metFORMIN 500 MG tablet  Commonly known as:  GLUCOPHAGE  TAKE 1 TABLET BY MOUTH TWICE DAILY     multivitamin tablet  Take 1 tablet by mouth daily.     pantoprazole 40 MG tablet  Commonly known as:  PROTONIX  TAKE 1 TABLET BY MOUTH EVERY NIGHT     potassium chloride SA 20 MEQ tablet  Commonly known as:  K-DUR,KLOR-CON  Take 1 tablet (20 mEq total) by mouth daily. Take while on lasix     predniSONE 10 MG tablet  Commonly known as:  DELTASONE  Take 4 tabs  daily with food x 4 days, then 3 tabs daily x 4 days, then 2 tabs daily x 4 days, then 1 tab daily x4 days then stop. #40     protein supplement Powd  Commonly known as:  UNJURY CHICKEN SOUP  Take 27 g (8 oz total) by mouth 2 (two) times daily.    OXYGEN 2 liters via nasal canula    Follow-up Information    Follow up with Rubye Oaks, NP On 04/08/2015.   Specialty:  Pulmonary Disease   Why:  at 1115 am    Contact information:   [address] [place] [address] [phone]       Follow up with [facility], MD On 04/28/2015.   Specialty:  Pulmonary Disease   Why:  1030am    Contact information:   [address] [place] [address] [phone]       Follow up with Corky Crafts., MD On 04/07/2015.   Specialties:  Cardiology, Radiology, Interventional Cardiology   Why:  1130 am    Contact information:   [address] [place] [address] [phone]       Discharged Condition: fair  Physician Statement:  The Patient was personally examined, the discharge assessment and plan has been personally  reviewed and I agree with ACNP Leiland Mihelich's assessment and plan. > 30 minutes of time have been dedicated to discharge assessment, planning and discharge instructions.   Signed: Shelby Mattocks 03/25/2015, 11:46 AM

## 2015-03-26 ENCOUNTER — Non-Acute Institutional Stay (SKILLED_NURSING_FACILITY): Payer: Medicare Other | Admitting: Internal Medicine

## 2015-03-26 ENCOUNTER — Encounter: Payer: Self-pay | Admitting: Internal Medicine

## 2015-03-26 ENCOUNTER — Telehealth: Payer: Self-pay | Admitting: Internal Medicine

## 2015-03-26 DIAGNOSIS — E876 Hypokalemia: Secondary | ICD-10-CM

## 2015-03-26 DIAGNOSIS — R1310 Dysphagia, unspecified: Secondary | ICD-10-CM

## 2015-03-26 DIAGNOSIS — E785 Hyperlipidemia, unspecified: Secondary | ICD-10-CM

## 2015-03-26 DIAGNOSIS — D72829 Elevated white blood cell count, unspecified: Secondary | ICD-10-CM | POA: Diagnosis not present

## 2015-03-26 DIAGNOSIS — K224 Dyskinesia of esophagus: Secondary | ICD-10-CM | POA: Diagnosis not present

## 2015-03-26 DIAGNOSIS — E46 Unspecified protein-calorie malnutrition: Secondary | ICD-10-CM

## 2015-03-26 DIAGNOSIS — I10 Essential (primary) hypertension: Secondary | ICD-10-CM | POA: Diagnosis not present

## 2015-03-26 DIAGNOSIS — J441 Chronic obstructive pulmonary disease with (acute) exacerbation: Secondary | ICD-10-CM

## 2015-03-26 DIAGNOSIS — E119 Type 2 diabetes mellitus without complications: Secondary | ICD-10-CM | POA: Diagnosis not present

## 2015-03-26 DIAGNOSIS — J189 Pneumonia, unspecified organism: Secondary | ICD-10-CM

## 2015-03-26 DIAGNOSIS — D638 Anemia in other chronic diseases classified elsewhere: Secondary | ICD-10-CM

## 2015-03-26 DIAGNOSIS — J9612 Chronic respiratory failure with hypercapnia: Secondary | ICD-10-CM

## 2015-03-26 DIAGNOSIS — R5381 Other malaise: Secondary | ICD-10-CM | POA: Diagnosis not present

## 2015-03-26 DIAGNOSIS — I5032 Chronic diastolic (congestive) heart failure: Secondary | ICD-10-CM | POA: Diagnosis not present

## 2015-03-26 NOTE — Progress Notes (Signed)
Patient ID: Nicole Maxwell, female   DOB: 18-Apr-1945, 70 y.o.   MRN: [id number]    LOCATION: [name]  PCP: [name], MD   Code Status: Full Code  Goals of care: Advanced Directive information Advanced Directives 03/16/2015  Does patient have an advance directive? Yes  Type of Advance Directive Healthcare Power of Attorney  Does patient want to make changes to advanced directive? No - Patient declined  Copy of advanced directive(s) in chart? No - copy requested  Would patient like information on creating an advanced directive? -     Extended Emergency Contact Information Primary Emergency Contact: Kathryne Gin [place] Mobile Phone: [phone] Relation: Daughter Secondary Emergency Contact: Martyn Ehrich [place] Home Phone: [phone] Relation: Grandaughter   Allergies  Allergen Reactions  . Brovana [Arformoterol Tartrate] Shortness Of Breath and Cough    General intolerance  . Budesonide Shortness Of Breath and Cough    General intolerance    Chief Complaint  Patient presents with  . New Admit To SNF    New Admission     HPI:  Patient is a 70 y.o. female seen today for short term rehabilitation post hospital admission from 03/15/15-03/25/15 with acute hypoxic hypercarbic respiratory failure with acute copd exacerbation and HCAP. She required iv antibiotics, steroids, bronchodilators, BiPAP and was later transitioned to oxygen. She has completed her antibiotics. She is seen in her room today. She has dyspnea with minimal exertion and feels weak and tired. She complaints of difficulty with swallowing. 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   Review of Systems:  Constitutional: Negative for fever, chills. HENT: Negative for headache, congestion, sore throat. Eyes: Negative for blurred vision, double vision and discharge.  Respiratory: positive for cough with white  phlegm. Has dyspnea   Cardiovascular: Negative for chest pain, palpitations, leg swelling.  Gastrointestinal: Negative for heartburn, nausea, vomiting, abdominal pain. Had bowel movement this am Genitourinary: Negative for dysuria Musculoskeletal: Negative for fall.  Skin: Negative for itching, rash.  Neurological: Negative for dizziness Psychiatric/Behavioral: Negative for depression   Past Medical History  Diagnosis Date  . Hypertension   . Chronic back pain     "goes from the lower part of my back on down my legs"  (04/17/2014)  . Osteopenia   . PVD (peripheral vascular disease) (HCC)   . Tobacco abuse     " I am addicted"   . Vitamin D deficiency disease     03/26/2010 - 9.9ng/mL. Started on replacemnt  . Alcohol abuse, in remission quit 2004  . Neuromuscular disorder (HCC)     lumbar disc  . Anxiety     Xanax prn  . Shortness of breath     with anxiety  and activty  . Asthma   . Hyperlipidemia   . On home oxygen therapy     "2L; suppose to be 24/7" (04/17/2014)  . COPD (chronic obstructive pulmonary disease) (HCC)     cxr 04/03/2010 - hyperinflation  . Pneumonia     "3 times in the last month or so" (04/17/2014)  . Type II diabetes mellitus (HCC)   . Arthritis     "my whole body"   Past Surgical History  Procedure Laterality Date  . Lumbar laminectomy/decompression microdiscectomy  2005    L4-5/notes 03/06/2010  . Appendectomy  1960  . Tonsillectomy  1960  . Posterior lumbar fusion  2002  . Abdominal hysterectomy      "  partial"  . Back surgery    . Carpal tunnel release Bilateral     CTS repair 2000 (R), 2006 (L)/notes 01/01/2014  . Cataract extraction w/ intraocular lens  implant, bilateral Bilateral 2015   Social History:   reports that she has been smoking Cigarettes.  She has a 14 pack-year smoking history. She has never used smokeless tobacco. She reports that she drinks alcohol. She reports that she does not use illicit drugs.  Family History  Problem  Relation Age of Onset  . Heart attack Sister 60    CABG  . Breast cancer Sister   . Cancer Brother   . Heart attack Brother   . Hypertension Other     all siblings  . Anesthesia problems Neg Hx     Medications:   Medication List       This list is accurate as of: 03/26/15  8:46 AM.  Always use your most recent med list.               acetaminophen 500 MG tablet  Commonly known as:  TYLENOL  Take 500-1,000 mg by mouth every 8 (eight) hours as needed (for pain).     albuterol 108 (90 Base) MCG/ACT inhaler  Commonly known as:  PROVENTIL HFA;VENTOLIN HFA  Inhale 2 puffs into the lungs every 4 (four) hours as needed for wheezing or shortness of breath.     albuterol (2.5 MG/3ML) 0.083% nebulizer solution  Commonly known as:  PROVENTIL  Take 3 mLs (2.5 mg total) by nebulization every 4 (four) hours as needed for wheezing or shortness of breath. (may take 1 every 4 hours as needed for wheezing/shortness of breath) DX: 496     ALPRAZolam 1 MG tablet  Commonly known as:  XANAX  Take 1 mg by mouth 3 (three) times daily as needed for anxiety.     aspirin 81 MG EC tablet  Take 1 tablet (81 mg total) by mouth daily.     atorvastatin 20 MG tablet  Commonly known as:  LIPITOR  Take 20 mg by mouth daily. Take 1 tablet daily     dextromethorphan-guaiFENesin 30-600 MG 12hr tablet  Commonly known as:  MUCINEX DM  Take 1 tablet by mouth 2 (two) times daily.     fluticasone 50 MCG/ACT nasal spray  Commonly known as:  FLONASE  Place 1 spray into both nostrils 2 (two) times daily.     furosemide 20 MG tablet  Commonly known as:  LASIX  Take 20 mg by mouth. Take 1 tablet every afternoon     furosemide 40 MG tablet  Commonly known as:  LASIX  Take 1 tablet (40 mg total) by mouth daily. Take 40 mg in the AM and 20 mg in the PM     ipratropium-albuterol 0.5-2.5 (3) MG/3ML Soln  Commonly known as:  DUONEB  Take 3 mLs by nebulization 4 (four) times daily.     irbesartan 150 MG  tablet  Commonly known as:  AVAPRO  Take 1 tablet (150 mg total) by mouth daily.     metFORMIN 500 MG tablet  Commonly known as:  GLUCOPHAGE  TAKE 1 TABLET BY MOUTH TWICE DAILY     multivitamin tablet  Take 1 tablet by mouth daily.     pantoprazole 40 MG tablet  Commonly known as:  PROTONIX  TAKE 1 TABLET BY MOUTH EVERY NIGHT     potassium chloride SA 20 MEQ tablet  Commonly known as:  K-DUR,KLOR-CON  Take  1 tablet (20 mEq total) by mouth daily. Take while on lasix     predniSONE 10 MG tablet  Commonly known as:  DELTASONE  Take 4 tabs  daily with food x 4 days, then 3 tabs daily x 4 days, then 2 tabs daily x 4 days, then 1 tab daily x4 days then stop. #40     protein supplement Powd  Commonly known as:  UNJURY CHICKEN SOUP  Take 27 g (8 oz total) by mouth 2 (two) times daily.        Immunizations: Immunization History  Administered Date(s) Administered  . Influenza Split 10/26/2010, 10/08/2011  . Influenza, High Dose Seasonal PF 11/14/2014  . Influenza-Unspecified 11/25/2013  . Pneumococcal Polysaccharide-23 10/26/2010     Physical Exam: Filed Vitals:   03/26/15 0821  BP: 106/52  Pulse: 73  Temp: 98.1 F (36.7 C)  TempSrc: Oral  Resp: 16  Height: 5\' 5"  (1.651 m)  Weight: 111 lb (50.349 kg)  SpO2: 97%   Body mass index is 18.47 kg/(m^2).  General- elderly frail and thin built female, chronically ill appearing, in no acute distress Head- normocephalic, atraumatic Nose- no maxillary or frontal sinus tenderness, no nasal discharge Throat- moist mucus membrane  Eyes- PERRLA, EOMI, no pallor, no icterus, no discharge, normal conjunctiva, normal sclera Neck- no cervical lymphadenopathy Cardiovascular- normal s1,s2, no murmur, no leg edema Respiratory- bilateral poor air entry with wheezing and rhonchi +, on o2, no use of accessory muscles Abdomen- bowel sounds present, soft, non tender Musculoskeletal- able to move all 4 extremities, generalized weakness, on  wheelchair Neurological- alert and oriented to person, place and time Skin- warm and dry Psychiatry- normal mood and affect    Labs reviewed: Basic Metabolic Panel:  Recent Labs  16/10/96 0345  03/16/15 0343 03/16/15 1944 03/17/15 0332  03/19/15 0359 03/19/15 1015 03/20/15 0343 03/21/15 03/21/15 0422 03/22/15 03/22/15 0418  NA 142  < > 139  --  141  < > 144  --  145 143 143  --   --   K 3.8  < > 5.1  --  4.9  < > 5.3* 4.4 4.6  --  4.6  --   --   CL 99*  < > 94*  --  96*  < > 103  --  103  --  105  --   --   CO2 32  < > 35*  --  34*  < > 33*  --  34*  --  31  --   --   GLUCOSE 124*  < > 154*  --  134*  < > 146*  --  107*  --  105*  --   --   BUN 39*  < > 34*  --  33*  < > 47*  --  40* 37* 37*  --   --   CREATININE 1.00  < > 0.77  --  0.80  < > 0.88  --  0.80  --  0.62 0.8 0.78  CALCIUM 8.6*  < > 8.8*  --  9.0  < > 8.9  --  8.7*  --  8.7*  --   --   MG 1.8  < > 1.5* 3.8* 3.4*  --   --   --   --   --   --   --   --   PHOS 2.9  --  3.1  --   --   --   --   --   --   --   --   --   --   < > =  values in this interval not displayed. Liver Function Tests:  Recent Labs  04/16/14 2224 11/27/14 0340 12/01/14 0107  AST 16 17 19   ALT 9 18 24   ALKPHOS 56 49 53  BILITOT 0.4 0.6 0.6  PROT 6.1 6.0* 6.4*  ALBUMIN 3.1* 3.1* 3.5   No results for input(s): LIPASE, AMYLASE in the last 8760 hours. No results for input(s): AMMONIA in the last 8760 hours. CBC:  Recent Labs  04/16/14 2224  11/26/14 1005  03/15/15 1430 03/16/15 0343 03/17/15 03/17/15 0332  WBC 12.2*  < > 15.5*  < > 17.2* 17.3* 20.2 20.2*  NEUTROABS 9.3*  --  12.9*  --  13.8*  --   --   --   HGB 10.0*  < > 10.9*  < > 10.3* 9.9*  --  10.2*  HCT 33.1*  < > 35.9*  < > 36.5 34.6*  --  33.9*  MCV 85.8  < > 88.9  < > 92.6 89.9  --  87.8  PLT 256  < > 244  < > 251 269  --  284  < > = values in this interval not displayed. Cardiac Enzymes:  Recent Labs  03/15/15 1430  TROPONINI <0.03   BNP: Invalid input(s):  POCBNP CBG:  Recent Labs  03/25/15 0354 03/25/15 0831 03/25/15 1139  GLUCAP 105* 90 102*    Radiological Exams: Dg Chest 1 View  03/15/2015  CLINICAL DATA:  Short of breath today EXAM: CHEST 1 VIEW COMPARISON:  12/11/2014 FINDINGS: Left basilar airspace disease has increased. Hyperaeration. Normal heart size. No pneumothorax. No pleural effusion. IMPRESSION: Left basilar airspace disease has increased. This likely represents acute inflammatory disease upon chronic changes. Followup PA and lateral chest X-ray is recommended in 3-4 weeks following trial of antibiotic therapy to ensure resolution and exclude underlying malignancy. Electronically Signed   By: Jolaine Click M.D.   On: 03/15/2015 14:27   Ct Chest Wo Contrast  03/24/2015  CLINICAL DATA:  Cough and shortness of breath for several months. Recent pneumonia. EXAM: CT CHEST WITHOUT CONTRAST TECHNIQUE: Multidetector CT imaging of the chest was performed following the standard protocol without IV contrast. COMPARISON:  03/22/2014 FINDINGS: Mediastinum / Lymph Nodes: There is no axillary lymphadenopathy. No mediastinal lymphadenopathy. There is no hilar lymphadenopathy. The heart size is normal. No pericardial effusion. Coronary artery calcification is noted. The esophagus has normal imaging features. Lungs / Pleura: Moderate changes of emphysema noted. No focal airspace consolidation. No suspicious pulmonary nodule or mass. Atelectasis noted in the posterior left lung base with associated small airway impaction. No evidence of pleural effusion. Upper Abdomen: 11 mm low-density lesion upper pole left kidney was present on the previous study measures water attenuation today. Although this cannot be definitively characterize, is likely a cyst. MSK / Soft Tissues: Bone windows reveal no worrisome lytic or sclerotic osseous lesions. IMPRESSION: 1. Left lower lobe small airway impaction with posterior left lower lobe mild collapse/consolidation. Imaging  features may be related to atypical infection. Aspiration is more typically seen in the right lung. 2. Emphysema. 3. Atherosclerosis. Electronically Signed   By: Kennith Center M.D.   On: 03/24/2015 11:47   Dg Chest Port 1 View  03/18/2015  CLINICAL DATA:  Shortness of breath and lower central chest pain since yesterday. Long time smoker. COPD. EXAM: PORTABLE CHEST 1 VIEW COMPARISON:  Chest radiograph 03/16/2015 and 05/16/2014. CT chest 03/22/2014 FINDINGS: Normal heart size. There is atherosclerotic calcification of the thoracic aortic arch. Trachea is midline. Pulmonary  vascularity is normal. There is attenuation of the lung markings near the apices, suggesting emphysema. The lung fields are clear. No airspace disease. No visible pleural effusion. Negative for pneumothorax. No acute osseous abnormality identified. IMPRESSION: Findings consistent with emphysema. No acute superimposed abnormality identified. Electronically Signed   By: Britta Mccreedy M.D.   On: 03/18/2015 11:10   Dg Chest Port 1 View  03/16/2015  CLINICAL DATA:  Acute respiratory failure. History of hypertension, asthma, diabetes and smoking. EXAM: PORTABLE CHEST 1 VIEW COMPARISON:  03/15/2015 FINDINGS: Lungs are hyperexpanded. There is interstitial thickening at the lung bases. The more focal opacity noted at the left lung base on the previous exam is no longer evident. The appearance is non stable from exam is there are far more remote. There are no areas of lung consolidation. No pleural effusion or pneumothorax. Heart is normal in size and configuration. Mild prominence of the hila appears vascular in etiology and stable from the prior exam. No mediastinal masses. IMPRESSION: 1. Area of opacity at the left base seen on the previous exam is not evident currently. 2. There is now chronic interstitial thickening in the lower lungs as well as lung hyperexpansion consistent with COPD. No convincing acute cardiopulmonary disease. Electronically  Signed   By: Amie Portland M.D.   On: 03/16/2015 08:17    Assessment/Plan  Physical deconditioning Will have her work with physical therapy and occupational therapy team to help with gait training and muscle strengthening exercises.fall precautions. Skin care. Encourage to be out of bed.   Chronic respiratory failure With recent acute exacerbation. Has completed her antibiotics. Continue oxygen and bronchodilators. Encouraged to use incentive spirometer  Copd stage 4 Continue proventil prn with scheduled duoneb. Completed her antibiotics. Continue o2. Continue and complete tapering course of prednisone on 04/11/15. Has f/u with pulmonary on 04/08/15. Currently off symbicort and spiriva.   HCAP Afebrile, completed her antibiotics, continue pulmonary toileting. Continue mucinex and monitor  Leukocytosis Likely from being on prednisone. Afebrile. Monitor cbc with diff  Anemia of chronic disease Monitor cbc  GAD Continue xanax 1 mg tid as needed and encouraged patient to ask for it if needed  Dysphagia Get SLP to evaluate and treat  Protein calorie malnutrition Add extra portion protein with meals for now and get dietary consult  HTN bp on lower side of normal. Currently on irbesartan 150 mg daily, lasix 40 mg daily in am and 20 mg at noon and check bp bid x 1 week. Check bmp  DM Continue metformin 500 mg bid and monitor cbg bid.  Lab Results  Component Value Date   HGBA1C 6.3* 11/26/2014    Hypokalemia Check bmp, continue kcl supplement  Diastolic chf Continue furosemide, irbesartan and monitor daily weight x 1 week, then 3 times a week. Check bmp  HLD Continue atorvastatin  Esophageal dysmotility Stable, continue protonix 40 mg daily    Goals of care: short term rehabilitation   Labs/tests ordered: cbc with diff, cmp   Family/ staff Communication: reviewed care plan with patient and nursing supervisor    Oneal Grout, MD Internal Medicine [facility] [address] [place], [place] [address] Cell Phone (Monday-Friday 8 am - 5 pm): [phone] On Call: [phone] and follow prompts after 5 pm and on weekends Office Phone: [phone] Office Fax: [phone]

## 2015-03-26 NOTE — Telephone Encounter (Signed)
Elise  ECHO was fine. So you can cancel her cards appt  Dr. Kalman Shan, M.D., [facility].C.P Pulmonary and Critical Care Medicine Staff Physician [facility] Pager: [phone], If no answer or between  15:00h - 7:00h: call [phone]  03/26/2015 6:14 AM

## 2015-03-26 NOTE — Telephone Encounter (Signed)
lmtcb for pt.  

## 2015-03-28 NOTE — Telephone Encounter (Signed)
lmtcb for pt.  

## 2015-03-31 LAB — BASIC METABOLIC PANEL
BUN: 42 mg/dL — AB (ref 4–21)
Creatinine: 1 mg/dL (ref 0.5–1.1)
GLUCOSE: 70 mg/dL
POTASSIUM: 4.1 mmol/L (ref 3.4–5.3)
SODIUM: 142 mmol/L (ref 137–147)

## 2015-03-31 LAB — HEPATIC FUNCTION PANEL
ALK PHOS: 42 U/L (ref 25–125)
ALT: 15 U/L (ref 7–35)
AST: 15 U/L (ref 13–35)
BILIRUBIN, TOTAL: 0.4 mg/dL

## 2015-03-31 LAB — CBC AND DIFFERENTIAL
HCT: 35 % — AB (ref 36–46)
Hemoglobin: 10.2 g/dL — AB (ref 12.0–16.0)
NEUTROS ABS: 11 /uL
Platelets: 403 10*3/uL — AB (ref 150–399)
WBC: 15.7 10^3/mL

## 2015-04-01 NOTE — Telephone Encounter (Signed)
lmtcb for pt.  

## 2015-04-04 LAB — BASIC METABOLIC PANEL
BUN: 41 mg/dL — AB (ref 4–21)
CREATININE: 1.2 mg/dL — AB (ref 0.5–1.1)
GLUCOSE: 206 mg/dL
POTASSIUM: 2.5 mmol/L — AB (ref 3.4–5.3)
SODIUM: 145 mmol/L (ref 137–147)

## 2015-04-04 LAB — CBC AND DIFFERENTIAL
HCT: 32 % — AB (ref 36–46)
HEMOGLOBIN: 9.8 g/dL — AB (ref 12.0–16.0)
Platelets: 268 10*3/uL (ref 150–399)
WBC: 12.2 10^3/mL

## 2015-04-07 ENCOUNTER — Non-Acute Institutional Stay (SKILLED_NURSING_FACILITY): Payer: Medicare Other | Admitting: Adult Health

## 2015-04-07 ENCOUNTER — Ambulatory Visit (INDEPENDENT_AMBULATORY_CARE_PROVIDER_SITE_OTHER): Payer: Medicare Other | Admitting: Interventional Cardiology

## 2015-04-07 ENCOUNTER — Encounter: Payer: Self-pay | Admitting: Adult Health

## 2015-04-07 ENCOUNTER — Encounter: Payer: Self-pay | Admitting: Emergency Medicine

## 2015-04-07 ENCOUNTER — Encounter: Payer: Self-pay | Admitting: Interventional Cardiology

## 2015-04-07 VITALS — BP 110/50 | HR 85 | Ht 65.0 in | Wt 118.0 lb

## 2015-04-07 DIAGNOSIS — R05 Cough: Secondary | ICD-10-CM | POA: Diagnosis not present

## 2015-04-07 DIAGNOSIS — R059 Cough, unspecified: Secondary | ICD-10-CM

## 2015-04-07 DIAGNOSIS — E8770 Fluid overload, unspecified: Secondary | ICD-10-CM

## 2015-04-07 DIAGNOSIS — E876 Hypokalemia: Secondary | ICD-10-CM

## 2015-04-07 DIAGNOSIS — R931 Abnormal findings on diagnostic imaging of heart and coronary circulation: Secondary | ICD-10-CM | POA: Insufficient documentation

## 2015-04-07 DIAGNOSIS — J449 Chronic obstructive pulmonary disease, unspecified: Secondary | ICD-10-CM | POA: Diagnosis not present

## 2015-04-07 MED ORDER — FUROSEMIDE 20 MG PO TABS: 20.0000 mg | ORAL_TABLET | Freq: Every day | ORAL | Status: DC

## 2015-04-07 NOTE — Progress Notes (Signed)
Patient ID: Nicole Maxwell, female   DOB: [birth year], 70 y.o.   MRN: [id number]     Cardiology Office Note   Date:  04/07/2015   ID:  Nicole Maxwell, DOB [birth year], MRN [id number]  PCP:  [name], MD    No chief complaint on file. Abnormal echo   Wt Readings from Last 3 Encounters:  04/07/15 118 lb (53.524 kg)  03/26/15 111 lb (50.349 kg)  03/25/15 125 lb 14.1 oz (57.1 kg)       History of Present Illness: Nicole Maxwell is a 70 y.o. female   Who has a history of tobacco abuse and severe COPD. She has been hospitalized several times in the last few months. She is now at [facility] recovering. She had an echocardiogram showing dilated right ventricle with normal RV systolic function. LV systolic function was normal. She is referred for further management of the abnormal echocardiogram.   She describes chest pain with coughing. Her other main complaint is frequent urination. She typically takes Lasix 20 mg daily. At [facility], she is receiving Lasix 40 mg in the morning and 20 mg in the evening. She is awake all night. She denies any swelling. She feels very thirsty all the time. She is having cramps.    Past Medical History  Diagnosis Date  . Hypertension   . Chronic back pain     "goes from the lower part of my back on down my legs"  (04/17/2014)  . Osteopenia   . PVD (peripheral vascular disease) (HCC)   . Tobacco abuse     " I am addicted"   . Vitamin D deficiency disease     03/26/2010 - 9.9ng/mL. Started on replacemnt  . Alcohol abuse, in remission quit 2004  . Neuromuscular disorder (HCC)     lumbar disc  . Anxiety     Xanax prn  . Shortness of breath     with anxiety  and activty  . Asthma   . Hyperlipidemia   . On home oxygen therapy     "2L; suppose to be 24/7" (04/17/2014)  . COPD (chronic obstructive pulmonary disease) (HCC)     cxr 04/03/2010 - hyperinflation  . Pneumonia     "3 times in the last month or so" (04/17/2014)  . Type II diabetes mellitus (HCC)    . Arthritis     "my whole body"    Past Surgical History  Procedure Laterality Date  . Lumbar laminectomy/decompression microdiscectomy  2005    L4-5/notes 03/06/2010  . Appendectomy  1960  . Tonsillectomy  1960  . Posterior lumbar fusion  2002  . Abdominal hysterectomy      "partial"  . Back surgery    . Carpal tunnel release Bilateral     CTS repair 2000 (R), 2006 (L)/notes 01/01/2014  . Cataract extraction w/ intraocular lens  implant, bilateral Bilateral 2015     Current Outpatient Prescriptions  Medication Sig Dispense Refill  . acetaminophen (TYLENOL) 500 MG tablet Take 500-1,000 mg by mouth every 8 (eight) hours as needed (for pain).    Marland Kitchen albuterol (PROVENTIL HFA;VENTOLIN HFA) 108 (90 BASE) MCG/ACT inhaler Inhale 2 puffs into the lungs every 4 (four) hours as needed for wheezing or shortness of breath.     Marland Kitchen albuterol (PROVENTIL) (2.5 MG/3ML) 0.083% nebulizer solution Take 3 mLs (2.5 mg total) by nebulization every 4 (four) hours as needed for wheezing or shortness of breath. (may take 1 every 4 hours as needed  for wheezing/shortness of breath) DX: 496 (Patient taking differently: Take 2.5 mg by nebulization 4 (four) times daily. Or may take 1 treatment every 6 hours as needed for wheezing/shortness of breath) 60 vial 12  . alprazolam (XANAX) 2 MG tablet Take 2 mg by mouth 3 (three) times daily as needed for anxiety.   2  . aspirin EC 81 MG EC tablet Take 1 tablet (81 mg total) by mouth daily.    Marland Kitchen. atorvastatin (LIPITOR) 20 MG tablet Take 20 mg by mouth daily. Take 1 tablet daily  6  . benzonatate (TESSALON PERLES) 100 MG capsule Take 100 mg by mouth 3 (three) times daily. FOR COUGH    . dextromethorphan-guaiFENesin (MUCINEX DM) 30-600 MG per 12 hr tablet Take 1 tablet by mouth 2 (two) times daily.    . fluticasone (FLONASE) 50 MCG/ACT nasal spray Place 1 spray into both nostrils 2 (two) times daily. (Patient taking differently: Place 1 spray into both nostrils 2 (two) times  daily as needed for allergies. ) 50 g prn  . furosemide (LASIX) 20 MG tablet Take 20 mg by mouth. Take 1 tablet every afternoon    . furosemide (LASIX) 40 MG tablet Take 1 tablet (40 mg total) by mouth daily. Take 40 mg in the AM and 20 mg in the PM 30 tablet 3  . ipratropium-albuterol (DUONEB) 0.5-2.5 (3) MG/3ML SOLN Take 3 mLs by nebulization 4 (four) times daily. 360 mL   . losartan (COZAAR) 50 MG tablet Take 50 mg by mouth daily.    . metFORMIN (GLUCOPHAGE) 500 MG tablet TAKE 1 TABLET BY MOUTH TWICE DAILY 180 tablet 0  . Multiple Vitamin (MULTIVITAMIN) tablet Take 1 tablet by mouth daily.    . pantoprazole (PROTONIX) 40 MG tablet TAKE 1 TABLET BY MOUTH EVERY NIGHT 90 tablet 0  . potassium chloride SA (K-DUR,KLOR-CON) 20 MEQ tablet Take 1 tablet (20 mEq total) by mouth daily. Take while on lasix 30 tablet 0  . predniSONE (DELTASONE) 10 MG tablet Take 4 tabs  daily with food x 4 days, then 3 tabs daily x 4 days, then 2 tabs daily x 4 days, then 1 tab daily x4 days then stop. #40    . Protein (PROCEL 100) POWD Take 2 scoop by mouth 2 (two) times daily.     . protein supplement (UNJURY CHICKEN SOUP) POWD Take 27 g (8 oz total) by mouth 2 (two) times daily.    . irbesartan (AVAPRO) 150 MG tablet Take 1 tablet (150 mg total) by mouth daily. (Patient not taking: Reported on 04/07/2015) 30 tablet 6   No current facility-administered medications for this visit.    Allergies:   Brovana and Budesonide    Social History:  The patient  reports that she has been smoking Cigarettes.  She has a 14 pack-year smoking history. She has never used smokeless tobacco. She reports that she drinks alcohol. She reports that she does not use illicit drugs.   Family History:  The patient's family history includes Breast cancer in her sister; Cancer in her brother; Heart attack in her brother; Heart attack (age of onset: 5362) in her sister; Hypertension in her other. There is no history of Anesthesia problems.    ROS:   Please see the history of present illness.   Otherwise, review of systems are positive for thirst and cramping.   All other systems are reviewed and negative.    PHYSICAL EXAM: VS:  BP 110/50 mmHg  Pulse 85  Ht   (1.651 m)  Wt 118 lb (53.524 kg)  BMI 19.64 kg/m2 , BMI Body mass index is 19.64 kg/(m^2). GEN: Frail HEENT: normal Neck: no JVD, carotid bruits, or masses Cardiac: RRR; no murmurs, rubs, or gallops,no edema - very skinny legs Respiratory:   wheezing to auscultation bilaterally, normal work of breathing GI: soft, nontender, nondistended, + BS MS: no deformity or atrophy Skin: warm and dry, no rash Neuro:  Strength and sensation are intact Psych: euthymic mood, full affect   EKG:   The ekg ordered today demonstrates    Recent Labs: 12/01/2014: ALT 24 03/15/2015: B Natriuretic Peptide 45.7 03/17/2015: Hemoglobin 10.2*; Magnesium 3.4*; Platelets 284 03/21/2015: BUN 37*; Potassium 4.6; Sodium 143 03/22/2015: Creatinine, Ser 0.78   Lipid Panel    Component Value Date/Time   TRIG 162* 12/03/2014 1634     Other studies Reviewed: Additional studies/ records that were reviewed today with results demonstrating:  Lab work reviewed showing BUN 42 creatinine 0.97. Potassium 4.1.   ASSESSMENT AND PLAN:  1. Abnormal echo: mildly dilated RV.  Normal LV function.   Dilated RV is likely from her COPD and changes to her pulmonary vasculature. PA pressure could not be estimated from the echo. No further cardiac management planned at this time. Continue pulmonary treatments.   2. Volume overload:  She now appears euvolemic. Decrease Lasix to 20 mg daily. If she got volume overloaded, she did take an extra 20 mg. I think she is having symptoms from the high-dose that she is getting at this time. Review of her labs shows an elevated BUN to creatinine ratio. 3. Tobacco abuse:  She has quit a few weeks ago.  She feels that her cough is worse.  I explained that it is still better for her  to abstain from cigarettes despite her perceived increased cough.     Current medicines are reviewed at length with the patient today.  The patient concerns regarding her medicines were addressed.  The following changes have been made:   Decrease Lasix as noted above  Labs/ tests ordered today include:  No orders of the defined types were placed in this encounter.    Recommend 150 minutes/week of aerobic exercise Low fat, low carb, high fiber diet recommended  Disposition:   FU  When necessary   Delorise Jackson., MD  04/07/2015 12:14 PM    [facility] [address] [place], [place], [place]  [address] Phone: [phone]; Fax: [phone]

## 2015-04-07 NOTE — Telephone Encounter (Signed)
LMTCB. Cards appt today and pt has already shown up for appt. Due to several unsuccessful attempts to reach pt a letter has been placed in outgoing mail for pt to return call about echo results. Nothing further needed.

## 2015-04-07 NOTE — Patient Instructions (Signed)
Medication Instructions:  Decrease Furosemide to 20 mg to be taken in the mornings. All other medication will remain the same.  Labwork: None  Testing/Procedures: None  Follow-Up: Your physician recommends that you schedule a follow-up appointment in: As needed     If you need a refill on your cardiac medications before your next appointment, please call your pharmacy.

## 2015-04-07 NOTE — Progress Notes (Signed)
Patient ID: Nicole Maxwell, female   DOB: September 12, 1945, 70 y.o.   MRN: [id number]    DATE:   04/07/15  MRN:  [id number]  BIRTHDAY: [birth year]  Facility:  Nursing Home Location:  [name]  Nursing Home Room Number: 102-P  LEVEL OF CARE:  SNF (31)  Contact Information    Name Relation Home Work Mobile   Nicole Maxwell Daughter   [phone]   Nicole, Maxwell [phone]     Alan Mulder   [phone]       Code Status History    Date Active Date Inactive Code Status Order ID Comments User Context   03/15/2015  5:53 PM 03/25/2015  6:28 PM Full Code [id number]  Oretha Milch, MD ED   11/26/2014 12:17 PM 12/12/2014  7:22 PM Full Code [id number]  Marinda Elk, MD Inpatient   04/16/2014  9:23 PM 04/18/2014  5:25 PM Full Code [id number]  Ron Parker, MD Inpatient   02/21/2014  6:47 PM 02/27/2014  2:24 PM Full Code [id number]  [name], MD Inpatient       Chief Complaint  Patient presents with  . Acute Visit    Hypokalemia and cough    HISTORY OF PRESENT ILLNESS:   This is a 70 year old female who was noted to have K 2.5. She complains of her left hand having "cramping" and will have numbness occasionally @ night. No complaints of chest pains. She takes Lasix for CHF.  She complains that she continues to cough and that Tessalon doesn't work for her.  She has been admitted to [facility] on 03/25/15 from [facility]. 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 He was hospitalized for acute hypoxic hypercarbic respiratory failure with acute COPD exacerbation and HCAP. She was given antibiotics, steroids, bronchodilators, Bipap and was later transitioned to oxygen.   She has been admitted to [facility] for a short-term rehabilitation.  PAST MEDICAL HISTORY:  Past Medical History  Diagnosis Date  . Hypertension   . Chronic back pain     "goes from the lower part of my back on  down my legs"  (04/17/2014)  . Osteopenia   . PVD (peripheral vascular disease) (HCC)   . Tobacco abuse     " I am addicted"   . Vitamin D deficiency disease     03/26/2010 - 9.9ng/mL. Started on replacemnt  . Alcohol abuse, in remission quit 2004  . Neuromuscular disorder (HCC)     lumbar disc  . Anxiety     Xanax prn  . Shortness of breath     with anxiety  and activty  . Asthma   . Hyperlipidemia   . On home oxygen therapy     "2L; suppose to be 24/7" (04/17/2014)  . COPD (chronic obstructive pulmonary disease) (HCC)     cxr 04/03/2010 - hyperinflation  . Pneumonia     "3 times in the last month or so" (04/17/2014)  . Type II diabetes mellitus (HCC)   . Arthritis     "my whole body"  . Physical deconditioning   . Chronic respiratory failure with hypercapnia (HCC)   . HCAP (healthcare-associated pneumonia)   . COPD exacerbation (HCC)   . Leukocytosis   . Dysphagia   . Protein calorie malnutrition (HCC)   . Esophageal dysmotility   . Anemia of chronic disease      CURRENT MEDICATIONS: Reviewed  Patient's Medications  New Prescriptions  No medications on file  Previous Medications   ACETAMINOPHEN (TYLENOL) 500 MG TABLET    Take 500-1,000 mg by mouth every 8 (eight) hours as needed (for pain).   ALBUTEROL (PROVENTIL HFA;VENTOLIN HFA) 108 (90 BASE) MCG/ACT INHALER    Inhale 2 puffs into the lungs every 4 (four) hours as needed for wheezing or shortness of breath.    ALBUTEROL (PROVENTIL) (2.5 MG/3ML) 0.083% NEBULIZER SOLUTION    Take 3 mLs (2.5 mg total) by nebulization every 4 (four) hours as needed for wheezing or shortness of breath. (may take 1 every 4 hours as needed for wheezing/shortness of breath) DX: 496   ALPRAZOLAM (XANAX) 1 MG TABLET    Take 1 mg by mouth 3 (three) times daily as needed for anxiety.   ASPIRIN EC 81 MG EC TABLET    Take 1 tablet (81 mg total) by mouth daily.   ATORVASTATIN (LIPITOR) 20 MG TABLET    Take 20 mg by mouth daily. Take 1 tablet daily    FLUTICASONE (FLONASE) 50 MCG/ACT NASAL SPRAY    Place 1 spray into both nostrils 2 (two) times daily.   FUROSEMIDE (LASIX) 20 MG TABLET    Take 20 mg by mouth. Take 20 mg QD, may also take 20 mg PRN edema   GUAIFENESIN-DEXTROMETHORPHAN (ROBITUSSIN DM) 100-10 MG/5ML SYRUP    Take 10 mLs by mouth every 6 (six) hours as needed for cough.   HYDROCOD POLST-CHLORPHEN POLST (TUSSIONEX PENNKINETIC ER PO)    Take 5 mLs by mouth 2 (two) times daily as needed.   IPRATROPIUM-ALBUTEROL (DUONEB) 0.5-2.5 (3) MG/3ML SOLN    Take 3 mLs by nebulization 4 (four) times daily.   LOSARTAN (COZAAR) 50 MG TABLET    Take 50 mg by mouth daily.   METFORMIN (GLUCOPHAGE) 500 MG TABLET    TAKE 1 TABLET BY MOUTH TWICE DAILY   MULTIPLE VITAMIN (MULTIVITAMIN) TABLET    Take 1 tablet by mouth daily.   PANTOPRAZOLE (PROTONIX) 40 MG TABLET    TAKE 1 TABLET BY MOUTH EVERY NIGHT   POTASSIUM CHLORIDE SA (K-DUR,KLOR-CON) 20 MEQ TABLET    Take 1 tablet (20 mEq total) by mouth daily. Take while on lasix   PREDNISONE (DELTASONE) 10 MG TABLET    Take 10 mg by mouth daily with breakfast. Take 1 tablet by mouth QD x 4 days and then stop.  Stop 04/11/15.   PROTEIN (PROCEL 100) POWD    Take 2 scoop by mouth 2 (two) times daily.   Modified Medications   No medications on file  Discontinued Medications   ALPRAZOLAM (XANAX) 2 MG TABLET    Take 2 mg by mouth 3 (three) times daily as needed for anxiety.    BENZONATATE (TESSALON PERLES) 100 MG CAPSULE    Take 100 mg by mouth 3 (three) times daily. Reported on 04/08/2015   DEXTROMETHORPHAN-GUAIFENESIN (MUCINEX DM) 30-600 MG PER 12 HR TABLET    Take 1 tablet by mouth 2 (two) times daily.   FUROSEMIDE (LASIX) 20 MG TABLET    Take 1 tablet (20 mg total) by mouth daily.   IRBESARTAN (AVAPRO) 150 MG TABLET    Take 1 tablet (150 mg total) by mouth daily.   LOSARTAN (COZAAR) 50 MG TABLET    Take 50 mg by mouth daily. Reported on 04/08/2015   PREDNISONE (DELTASONE) 10 MG TABLET    Take 4 tabs  daily with food  x 4 days, then 3 tabs daily x 4 days, then 2 tabs daily  x 4 days, then 1 tab daily x4 days then stop. #40   PROTEIN SUPPLEMENT (UNJURY CHICKEN SOUP) POWD    Take 27 g (8 oz total) by mouth 2 (two) times daily.     Allergies  Allergen Reactions  . Brovana [Arformoterol Tartrate] Shortness Of Breath and Cough    General intolerance  . Budesonide Shortness Of Breath and Cough    General intolerance     REVIEW OF SYSTEMS:  GENERAL: no change in appetite, no fatigue, no weight changes, no fever, chills or weakness EYES: Denies change in vision, dry eyes, eye pain, itching or discharge EARS: Denies change in hearing, ringing in ears, or earache NOSE: Denies nasal congestion or epistaxis MOUTH and THROAT: Denies oral discomfort, gingival pain or bleeding, pain from teeth or hoarseness   RESPIRATORY: no SOB, DOE, wheezing, hemoptysis, +cough CARDIAC: no chest pain, edema or palpitations GI: no abdominal pain, diarrhea, constipation, heart burn, nausea or vomiting GU: Denies dysuria, frequency, hematuria, incontinence, or discharge PSYCHIATRIC: Denies feeling of depression or anxiety. No report of hallucinations, insomnia, paranoia, or agitation   PHYSICAL EXAMINATION  GENERAL APPEARANCE: Well nourished. In no acute distress. Normal body habitus SKIN:  Skin is warm and dry.  HEAD: Normal in size and contour. No evidence of trauma EYES: Lids open and close normally. No blepharitis, entropion or ectropion. PERRL. Conjunctivae are clear and sclerae are white. Lenses are without opacity EARS: Pinnae are normal. Patient hears normal voice tunes of the examiner MOUTH and THROAT: Lips are without lesions. Oral mucosa is moist and without lesions. Tongue is normal in shape, size, and color and without lesions NECK: supple, trachea midline, no neck masses, no thyroid tenderness, no thyromegaly LYMPHATICS: no LAN in the neck, no supraclavicular LAN RESPIRATORY: breathing is even &  unlabored CARDIAC: RRR, no murmur,no extra heart sounds, no edema GI: abdomen soft, normal BS, no masses, no tenderness, no hepatomegaly, no splenomegaly EXTREMITIES:  Able to move X 4 extremities PSYCHIATRIC: Alert and oriented X 3. Affect and behavior are appropriate  LABS/RADIOLOGY: Labs reviewed: Basic Metabolic Panel:  Recent Labs  16/10/96 0345  03/16/15 0343 03/16/15 1944 03/17/15 0332  03/19/15 0359  03/20/15 0343  03/21/15 0422  03/22/15 0418 03/31/15 04/04/15  NA 142  < > 139  --  141  < > 144  --  145  < > 143  --   --  142 145  K 3.8  < > 5.1  --  4.9  < > 5.3*  < > 4.6  --  4.6  --   --  4.1 2.5*  CL 99*  < > 94*  --  96*  < > 103  --  103  --  105  --   --   --   --   CO2 32  < > 35*  --  34*  < > 33*  --  34*  --  31  --   --   --   --   GLUCOSE 124*  < > 154*  --  134*  < > 146*  --  107*  --  105*  --   --   --   --   BUN 39*  < > 34*  --  33*  < > 47*  --  40*  < > 37*  --   --  42* 41*  CREATININE 1.00  < > 0.77  --  0.80  < > 0.88  --  0.80  --  0.62  < > 0.78 1.0 1.2*  CALCIUM 8.6*  < > 8.8*  --  9.0  < > 8.9  --  8.7*  --  8.7*  --   --   --   --   MG 1.8  < > 1.5* 3.8* 3.4*  --   --   --   --   --   --   --   --   --   --   PHOS 2.9  --  3.1  --   --   --   --   --   --   --   --   --   --   --   --   < > = values in this interval not displayed. Liver Function Tests:  Recent Labs  04/16/14 2224 11/27/14 0340 12/01/14 0107 03/31/15  AST ALT ALKPHOS 56 49 53 42  BILITOT 0.4 0.6 0.6  --   PROT 6.1 6.0* 6.4*  --   ALBUMIN 3.1* 3.1* 3.5  --     CBC:  Recent Labs  11/26/14 1005  03/15/15 1430 03/16/15 0343  03/17/15 0332 03/31/15 04/04/15  WBC 15.5*  < > 17.2* 17.3*  < > 20.2* 15.7 12.2  NEUTROABS 12.9*  --  13.8*  --   --   --  11  --   HGB 10.9*  < > 10.3* 9.9*  --  10.2* 10.2* 9.8*  HCT 35.9*  < > 36.5 34.6*  --  33.9* 35* 32*  MCV 88.9  < > 92.6 89.9  --  87.8  --   --   PLT 244  < > 251 269  --  284 403* 268  <  > = values in this interval not displayed.  Cardiac Enzymes:  Recent Labs  03/15/15 1430  TROPONINI <0.03   CBG:  Recent Labs  03/25/15 0354 03/25/15 0831 03/25/15 1139  GLUCAP 105* 90 102*     Dg Chest 1 View  03/15/2015  CLINICAL DATA:  Short of breath today EXAM: CHEST 1 VIEW COMPARISON:  12/11/2014 FINDINGS: Left basilar airspace disease has increased. Hyperaeration. Normal heart size. No pneumothorax. No pleural effusion. IMPRESSION: Left basilar airspace disease has increased. This likely represents acute inflammatory disease upon chronic changes. Followup PA and lateral chest X-ray is recommended in 3-4 weeks following trial of antibiotic therapy to ensure resolution and exclude underlying malignancy. Electronically Signed   By: Jolaine Click M.D.   On: 03/15/2015 14:27   Dg Chest 2 View  04/08/2015  CLINICAL DATA:  Followup pneumonia. Cough, shortness breath continues. EXAM: CHEST  2 VIEW COMPARISON:  11/15/2015, chest CT 03/24/2015. FINDINGS: There is hyperinflation of the lungs compatible with COPD. Heart and mediastinal contours are within normal limits. No focal opacities or effusions. No acute bony abnormality. IMPRESSION: COPD.  No active disease.  No visible focal opacities. Electronically Signed   By: Charlett Nose M.D.   On: 04/08/2015 12:00   Ct Chest Wo Contrast  03/24/2015  CLINICAL DATA:  Cough and shortness of breath for several months. Recent pneumonia. EXAM: CT CHEST WITHOUT CONTRAST TECHNIQUE: Multidetector CT imaging of the chest was performed following the standard protocol without IV contrast. COMPARISON:  03/22/2014 FINDINGS: Mediastinum / Lymph Nodes: There is no axillary lymphadenopathy. No mediastinal lymphadenopathy. There is no hilar lymphadenopathy. The heart size is normal. No pericardial effusion. Coronary artery calcification is  noted. The esophagus has normal imaging features. Lungs / Pleura: Moderate changes of emphysema noted. No focal airspace  consolidation. No suspicious pulmonary nodule or mass. Atelectasis noted in the posterior left lung base with associated small airway impaction. No evidence of pleural effusion. Upper Abdomen: 11 mm low-density lesion upper pole left kidney was present on the previous study measures water attenuation today. Although this cannot be definitively characterize, is likely a cyst. MSK / Soft Tissues: Bone windows reveal no worrisome lytic or sclerotic osseous lesions. IMPRESSION: 1. Left lower lobe small airway impaction with posterior left lower lobe mild collapse/consolidation. Imaging features may be related to atypical infection. Aspiration is more typically seen in the right lung. 2. Emphysema. 3. Atherosclerosis. Electronically Signed   By: Kennith CenterEric  Mansell M.D.   On: 03/24/2015 11:47   Dg Chest Port 1 View  03/18/2015  CLINICAL DATA:  Shortness of breath and lower central chest pain since yesterday. Long time smoker. COPD. EXAM: PORTABLE CHEST 1 VIEW COMPARISON:  Chest radiograph 03/16/2015 and 05/16/2014. CT chest 03/22/2014 FINDINGS: Normal heart size. There is atherosclerotic calcification of the thoracic aortic arch. Trachea is midline. Pulmonary vascularity is normal. There is attenuation of the lung markings near the apices, suggesting emphysema. The lung fields are clear. No airspace disease. No visible pleural effusion. Negative for pneumothorax. No acute osseous abnormality identified. IMPRESSION: Findings consistent with emphysema. No acute superimposed abnormality identified. Electronically Signed   By: Britta MccreedySusan  Turner M.D.   On: 03/18/2015 11:10   Dg Chest Port 1 View  03/16/2015  CLINICAL DATA:  Acute respiratory failure. History of hypertension, asthma, diabetes and smoking. EXAM: PORTABLE CHEST 1 VIEW COMPARISON:  03/15/2015 FINDINGS: Lungs are hyperexpanded. There is interstitial thickening at the lung bases. The more focal opacity noted at the left lung base on the previous exam is no longer  evident. The appearance is non stable from exam is there are far more remote. There are no areas of lung consolidation. No pleural effusion or pneumothorax. Heart is normal in size and configuration. Mild prominence of the hila appears vascular in etiology and stable from the prior exam. No mediastinal masses. IMPRESSION: 1. Area of opacity at the left base seen on the previous exam is not evident currently. 2. There is now chronic interstitial thickening in the lower lungs as well as lung hyperexpansion consistent with COPD. No convincing acute cardiopulmonary disease. Electronically Signed   By: Amie Portlandavid  Ormond M.D.   On: 03/16/2015 08:17    ASSESSMENT/PLAN:  Hypokalemia - K 2.5; give KCL 20 meq 2 tabs = 40 meq PO X 2 days then KCL 20 meq 1 tab PO Q D; BMP on 04/09/15  Cough - discontinue Tessalon and start Robitussin DM 10 ml PO Q 6 hours X 2 weeks then PRN      [facility] HospitalMEDINA-VARGAS,Rozanna Cormany, NP [facility] [phone]

## 2015-04-08 ENCOUNTER — Ambulatory Visit (INDEPENDENT_AMBULATORY_CARE_PROVIDER_SITE_OTHER)
Admission: RE | Admit: 2015-04-08 | Discharge: 2015-04-08 | Disposition: A | Discharge status: Home / Self Care | Payer: Medicare Other | Source: Ambulatory Visit | Attending: Adult Health | Admitting: Adult Health

## 2015-04-08 ENCOUNTER — Encounter: Payer: Self-pay | Admitting: Adult Health

## 2015-04-08 ENCOUNTER — Ambulatory Visit (INDEPENDENT_AMBULATORY_CARE_PROVIDER_SITE_OTHER): Payer: Medicare Other | Admitting: Adult Health

## 2015-04-08 VITALS — BP 130/60 | HR 101 | Temp 98.1°F | Ht <= 58 in | Wt 123.0 lb

## 2015-04-08 DIAGNOSIS — J9611 Chronic respiratory failure with hypoxia: Secondary | ICD-10-CM | POA: Diagnosis not present

## 2015-04-08 DIAGNOSIS — J441 Chronic obstructive pulmonary disease with (acute) exacerbation: Secondary | ICD-10-CM | POA: Diagnosis not present

## 2015-04-08 DIAGNOSIS — J189 Pneumonia, unspecified organism: Secondary | ICD-10-CM

## 2015-04-08 DIAGNOSIS — J9612 Chronic respiratory failure with hypercapnia: Secondary | ICD-10-CM

## 2015-04-08 NOTE — Assessment & Plan Note (Signed)
Improving  cxr shows resolution  Cont w/ current regimen

## 2015-04-08 NOTE — Assessment & Plan Note (Signed)
Recent flare now resolving   Plan  Cont on current regimen   

## 2015-04-08 NOTE — Patient Instructions (Addendum)
Continue on current regimen  Follow up with Dr. Marchelle Gearingamaswamy in 2-3 weeks as planned  Please contact office for sooner follow up if symptoms do not improve or worsen or seek emergency care

## 2015-04-08 NOTE — Progress Notes (Signed)
Subjective:    Patient ID: Nicole Maxwell, female    DOB: 1945-10-31, 70 y.o.   MRN: [id number]  70 yo female former smoker with COPD and O2 dependent RF.   TEST  #COPD - Oxygen dependent since 2011  - Doubtamine stress echo 05/12/10 - normal, CXR July 2011 - clear  - PFTs 04/13/2010 - shows Gold stage 4 COPD with BD response/asthma component (fev1 0.5L/29%, 10% BD response on fev1 and 38% on FC), DLCO 27%)  - Spirometry Jan 2013 - fev1 0.74L/37%, RAtop 44 - shows gold stage 3 copd - CAT score - 23 in May 2013 - CAT score 19 in NOv 2013    04/08/2015 Post Hospital follow up  Pt returns for a post hospital follow up .  She was admitted for COPD flare  , hypercarbic RF, +rhinovirus and LLL PNA w/ pansensitive pseudomonas and resp failure requiring vent. She was discharged to SNF. Recommended for nocturnal BIPAP , smoking cessation. Prednisone taper.  She remains very weak. Gets winded with minimal act. Stil has congested cough, no fever or hemoptyiss  CXR today shows COPD w/ no acute dz.  Seen cardiology yesterday , lasix decreased  daily .  She has worn the BIPAP for couple of days. But none recently . Advised on importance of wearing At bedtime .  Daughter is with her today and is going to go talk with DON at SNF to make sure her mom is getting the treatment she is suppose to . They are not very happy with facility.  Denies chest pain, orthopnea, edema or fever.  No smoking x 1 month .    Past Medical History  Diagnosis Date  . Hypertension   . Chronic back pain     "goes from the lower part of my back on down my legs"  (04/17/2014)  . Osteopenia   . PVD (peripheral vascular disease) (HCC)   . Tobacco abuse     " I am addicted"   . Vitamin D deficiency disease     03/26/2010 - 9.9ng/mL. Started on replacemnt  . Alcohol abuse, in remission quit 2004  . Neuromuscular disorder (HCC)     lumbar disc  . Anxiety     Xanax prn  . Shortness of breath     with anxiety  and activty  .  Asthma   . Hyperlipidemia   . On home oxygen therapy     "2L; suppose to be 24/7" (04/17/2014)  . COPD (chronic obstructive pulmonary disease) (HCC)     cxr 04/03/2010 - hyperinflation  . Pneumonia     "3 times in the last month or so" (04/17/2014)  . Type II diabetes mellitus (HCC)   . Arthritis     "my whole body"   Current Outpatient Prescriptions on File Prior to Visit  Medication Sig Dispense Refill  . acetaminophen (TYLENOL) 500 MG tablet Take 500-1,000 mg by mouth every 8 (eight) hours as needed (for pain).    Marland Kitchen albuterol (PROVENTIL HFA;VENTOLIN HFA) 108 (90 BASE) MCG/ACT inhaler Inhale 2 puffs into the lungs every 4 (four) hours as needed for wheezing or shortness of breath.     Marland Kitchen albuterol (PROVENTIL) (2.5 MG/3ML) 0.083% nebulizer solution Take 3 mLs (2.5 mg total) by nebulization every 4 (four) hours as needed for wheezing or shortness of breath. (may take 1 every 4 hours as needed for wheezing/shortness of breath) DX: 496 60 vial 12  . alprazolam (XANAX) 2 MG tablet Take 2 mg by  mouth 3 (three) times daily as needed for anxiety.   2  . aspirin EC 81 MG EC tablet Take 1 tablet (81 mg total) by mouth daily.    Marland Kitchen atorvastatin (LIPITOR) 20 MG tablet Take 20 mg by mouth daily. Take 1 tablet daily  6  . fluticasone (FLONASE) 50 MCG/ACT nasal spray Place 1 spray into both nostrils 2 (two) times daily. 50 g prn  . furosemide (LASIX) 20 MG tablet Take 20 mg by mouth. Take 20 mg QD, may also take 20 mg PRN edema    . ipratropium-albuterol (DUONEB) 0.5-2.5 (3) MG/3ML SOLN Take 3 mLs by nebulization 4 (four) times daily. 360 mL   . metFORMIN (GLUCOPHAGE) 500 MG tablet TAKE 1 TABLET BY MOUTH TWICE DAILY 180 tablet 0  . Multiple Vitamin (MULTIVITAMIN) tablet Take 1 tablet by mouth daily.    . pantoprazole (PROTONIX) 40 MG tablet TAKE 1 TABLET BY MOUTH EVERY NIGHT 90 tablet 0  . potassium chloride SA (K-DUR,KLOR-CON) 20 MEQ tablet Take 1 tablet (20 mEq total) by mouth daily. Take while on lasix 30  tablet 0  . predniSONE (DELTASONE) 10 MG tablet Take 10 mg by mouth daily with breakfast. Take 1 tablet by mouth QD x 4 days and then stop.  Stop 04/11/15.    . Protein (PROCEL 100) POWD Take 2 scoop by mouth 2 (two) times daily.     . benzonatate (TESSALON PERLES) 100 MG capsule Take 100 mg by mouth 3 (three) times daily. Reported on 04/08/2015     No current facility-administered medications on file prior to visit.      Review of Systems Constitutional:   No  weight loss, night sweats,  Fevers, chills,  +fatigue, or  lassitude.  HEENT:   No headaches,  Difficulty swallowing,  Tooth/dental problems, or  Sore throat,                No sneezing, itching, ear ache, nasal congestion, post nasal drip,   CV:  No chest pain,  Orthopnea, PND, swelling in lower extremities, anasarca, dizziness, palpitations, syncope.   GI  No heartburn, indigestion, abdominal pain, nausea, vomiting, diarrhea, change in bowel habits, loss of appetite, bloody stools.   Resp:   No chest wall deformity  Skin: no rash or lesions.  GU: no dysuria, change in color of urine, no urgency or frequency.  No flank pain, no hematuria   MS:  No joint pain or swelling.  No decreased range of motion.  No back pain.  Psych:  No change in mood or affect. No depression or anxiety.  No memory loss.      Objective:   Physical Exam Filed Vitals:   04/08/15 1125  BP: 130/60  Pulse: 101  Temp: 98.1 F (36.7 C)  TempSrc: Oral  Height:  (1.473 m)  Weight: 123 lb (55.792 kg)  SpO2: 100%    GEN: A/Ox3; pleasant , chronically ill appearing in wc on O2   HEENT:  [place]/AT,  EACs-clear, TMs-wnl, NOSE-clear, THROAT-clear, no lesions, no postnasal drip or exudate noted.   NECK:  Supple w/ fair ROM; no JVD; normal carotid impulses w/o bruits; no thyromegaly or nodules palpated; no lymphadenopathy.  RESP  Decreased BS in bases w/ no wheezingno accessory muscle use, no dullness to percussion  CARD:  RRR, no m/r/g  , no  peripheral edema, pulses intact, no cyanosis or clubbing.  GI:   Soft & nt; nml bowel sounds; no organomegaly or masses detected.  Musco:  Warm bil, no deformities or joint swelling noted.   Neuro: alert, no focal deficits noted.    Skin: Warm, no lesions or rashes   Alleyah Twombly NP-C  [facility]  04/08/2015       Assessment & Plan:

## 2015-04-08 NOTE — Assessment & Plan Note (Signed)
Recent flare of hypercarbic/hypoxic RF  Cont on O2  Wear BIPAP At bedtime

## 2015-04-09 ENCOUNTER — Telehealth: Payer: Self-pay | Admitting: Interventional Cardiology

## 2015-04-09 NOTE — Telephone Encounter (Signed)
Joy, the pts second shift nurse, states that the 1st shift nurse was needing clarification on the pts Lasix dose and that since ReunionKecia called they have found the correct dosage. She is advised that the pts Lasix should be 20 mg daily and that she may have one additional 20 mg dose for increased edema. She verbalized understanding and states that is what they have listed in the pts med list.

## 2015-04-09 NOTE — Telephone Encounter (Signed)
Returned ReunionKecia (nurse @ [facility]) call, and I was transferred 2 times to an extension, no voice mail, no answer

## 2015-04-09 NOTE — Telephone Encounter (Signed)
New Message:   Please call,concerning her Lasix dosage.

## 2015-04-09 NOTE — Progress Notes (Signed)
Quick Note:  ATC pt. Unable to LVM. WCB ______

## 2015-04-11 ENCOUNTER — Encounter: Payer: Self-pay | Admitting: Adult Health

## 2015-04-11 ENCOUNTER — Non-Acute Institutional Stay (SKILLED_NURSING_FACILITY): Payer: Medicare Other | Admitting: Adult Health

## 2015-04-11 DIAGNOSIS — K224 Dyskinesia of esophagus: Secondary | ICD-10-CM | POA: Diagnosis not present

## 2015-04-11 DIAGNOSIS — D72829 Elevated white blood cell count, unspecified: Secondary | ICD-10-CM | POA: Diagnosis not present

## 2015-04-11 DIAGNOSIS — I10 Essential (primary) hypertension: Secondary | ICD-10-CM | POA: Diagnosis not present

## 2015-04-11 DIAGNOSIS — D638 Anemia in other chronic diseases classified elsewhere: Secondary | ICD-10-CM

## 2015-04-11 DIAGNOSIS — J9612 Chronic respiratory failure with hypercapnia: Secondary | ICD-10-CM | POA: Diagnosis not present

## 2015-04-11 DIAGNOSIS — E876 Hypokalemia: Secondary | ICD-10-CM

## 2015-04-11 DIAGNOSIS — J441 Chronic obstructive pulmonary disease with (acute) exacerbation: Secondary | ICD-10-CM | POA: Diagnosis not present

## 2015-04-11 DIAGNOSIS — R5381 Other malaise: Secondary | ICD-10-CM | POA: Diagnosis not present

## 2015-04-11 DIAGNOSIS — E119 Type 2 diabetes mellitus without complications: Secondary | ICD-10-CM

## 2015-04-11 DIAGNOSIS — F419 Anxiety disorder, unspecified: Secondary | ICD-10-CM | POA: Diagnosis not present

## 2015-04-11 DIAGNOSIS — E785 Hyperlipidemia, unspecified: Secondary | ICD-10-CM | POA: Diagnosis not present

## 2015-04-11 DIAGNOSIS — I5032 Chronic diastolic (congestive) heart failure: Secondary | ICD-10-CM | POA: Diagnosis not present

## 2015-04-11 DIAGNOSIS — E46 Unspecified protein-calorie malnutrition: Secondary | ICD-10-CM | POA: Diagnosis not present

## 2015-04-11 NOTE — Progress Notes (Signed)
Quick Note:  Called and spoke with pt. Reviewed results and recs. Pt voiced understanding and had no further questions. ______ 

## 2015-04-11 NOTE — Progress Notes (Signed)
Patient ID: Nicole Maxwell, female   DOB: 1945-06-04, 70 y.o.   MRN: [id number]    DATE:   04/11/15  MRN:  [id number]  BIRTHDAY: 1945-06-04  Facility:  Nursing Home Location:  [name]  Nursing Home Room Number: 102-P  LEVEL OF CARE:  SNF (31)  Contact Information    Name Relation Home Work Mobile   BoltonBelin,[name] Daughter   [phone]   Lurlean NannyBaker,Mekierra Grandaughter [phone]     Alan MulderJones,Toyka Friend   [phone]       Code Status History    Date Active Date Inactive Code Status Order ID Comments User Context   03/15/2015  5:53 PM 03/25/2015  6:28 PM Full Code [id number]  Oretha Milchakesh Alva V, MD ED   11/26/2014 12:17 PM 12/12/2014  7:22 PM Full Code [id number]  Marinda ElkAbraham Feliz Ortiz, MD Inpatient   04/16/2014  9:23 PM 04/18/2014  5:25 PM Full Code [id number]  Ron ParkerHarvette C Jenkins, MD Inpatient   02/21/2014  6:47 PM 02/27/2014  2:24 PM Full Code [id number]  Martha ClanWilliam Shaw, MD Inpatient       Chief Complaint  Patient presents with  . Discharge Note    HISTORY OF PRESENT ILLNESS:   This is a 70 year old female who is for discharge home with Home health PT for endurance, OT for ADLs, CNA for showers and Nursing for medication management. DME:  Rolling walker with seat. She has been admitted to [facility]. 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 She was hospitalized for acute hypoxic hypercarbic respiratory failure with acute COPD exacerbation and HCAP. She was given antibiotics, steroids, bronchodilators, Bipap and was later transitioned to oxygen.   Patient was admitted to this facility for short-term rehabilitation after the patient's recent hospitalization.  Patient has completed SNF rehabilitation and therapy has cleared the patient for discharge.   PAST MEDICAL HISTORY:  Past Medical History  Diagnosis Date  . Hypertension   . Chronic back pain     "goes from the lower part of  my back on down my legs"  (04/17/2014)  . Osteopenia   . PVD (peripheral vascular disease) (HCC)   . Tobacco abuse     " I am addicted"   . Vitamin D deficiency disease     03/26/2010 - 9.9ng/mL. Started on replacemnt  . Alcohol abuse, in remission quit 2004  . Neuromuscular disorder (HCC)     lumbar disc  . Anxiety     Xanax prn  . Shortness of breath     with anxiety  and activty  . Asthma   . Hyperlipidemia   . On home oxygen therapy     "2L; suppose to be 24/7" (04/17/2014)  . COPD (chronic obstructive pulmonary disease) (HCC)     cxr 04/03/2010 - hyperinflation  . Pneumonia     "3 times in the last month or so" (04/17/2014)  . Type II diabetes mellitus (HCC)   . Arthritis     "my whole body"  . Physical deconditioning   . Chronic respiratory failure with hypercapnia (HCC)   . HCAP (healthcare-associated pneumonia)   . COPD exacerbation (HCC)   . Leukocytosis   . Dysphagia   . Protein calorie malnutrition (HCC)   . Esophageal dysmotility   . Anemia of chronic disease      CURRENT MEDICATIONS: Reviewed  Patient's Medications  New Prescriptions   No medications on file  Previous  Medications   ACETAMINOPHEN (TYLENOL) 500 MG TABLET    Take 500-1,000 mg by mouth every 8 (eight) hours as needed (for pain).   ALBUTEROL (PROVENTIL HFA;VENTOLIN HFA) 108 (90 BASE) MCG/ACT INHALER    Inhale 2 puffs into the lungs every 4 (four) hours as needed for wheezing or shortness of breath.    ALBUTEROL (PROVENTIL) (2.5 MG/3ML) 0.083% NEBULIZER SOLUTION    Take 3 mLs (2.5 mg total) by nebulization every 4 (four) hours as needed for wheezing or shortness of breath. (may take 1 every 4 hours as needed for wheezing/shortness of breath) DX: 496   ALPRAZOLAM (XANAX) 1 MG TABLET    Take 1 mg by mouth 3 (three) times daily as needed for anxiety.   ASPIRIN EC 81 MG EC TABLET    Take 1 tablet (81 mg total) by mouth daily.   ATORVASTATIN (LIPITOR) 20 MG TABLET    Take 20 mg by mouth daily. Take 1 tablet  daily   FLUTICASONE (FLONASE) 50 MCG/ACT NASAL SPRAY    Place 1 spray into both nostrils 2 (two) times daily.   FUROSEMIDE (LASIX) 20 MG TABLET    Take 20 mg by mouth. Take 20 mg QD, may also take 20 mg PRN edema   GUAIFENESIN-DEXTROMETHORPHAN (ROBITUSSIN DM) 100-10 MG/5ML SYRUP    Take 10 mLs by mouth every 6 (six) hours as needed for cough.   HYDROCOD POLST-CHLORPHEN POLST (TUSSIONEX PENNKINETIC ER PO)    Take 5 mLs by mouth 2 (two) times daily as needed.   IPRATROPIUM-ALBUTEROL (DUONEB) 0.5-2.5 (3) MG/3ML SOLN    Take 3 mLs by nebulization 4 (four) times daily.   LOSARTAN (COZAAR) 50 MG TABLET    Take 50 mg by mouth daily.   METFORMIN (GLUCOPHAGE) 500 MG TABLET    TAKE 1 TABLET BY MOUTH TWICE DAILY   MULTIPLE VITAMIN (MULTIVITAMIN) TABLET    Take 1 tablet by mouth daily.   PANTOPRAZOLE (PROTONIX) 40 MG TABLET    TAKE 1 TABLET BY MOUTH EVERY NIGHT   POTASSIUM CHLORIDE SA (K-DUR,KLOR-CON) 20 MEQ TABLET    Take 1 tablet (20 mEq total) by mouth daily. Take while on lasix   PREDNISONE (DELTASONE) 10 MG TABLET    Take 10 mg by mouth daily with breakfast. Take 1 tablet by mouth QD x 4 days and then stop.  Stop 04/11/15.   PROTEIN (PROCEL 100) POWD    Take 2 scoop by mouth 2 (two) times daily.   Modified Medications   No medications on file  Discontinued Medications   ALPRAZOLAM (XANAX) 2 MG TABLET    Take 2 mg by mouth 3 (three) times daily as needed for anxiety.    BENZONATATE (TESSALON PERLES) 100 MG CAPSULE    Take 100 mg by mouth 3 (three) times daily. Reported on 04/08/2015   FUROSEMIDE (LASIX) 40 MG TABLET    Take 40 mg by mouth every morning.   IRBESARTAN (AVAPRO) 150 MG TABLET    Take 150 mg by mouth daily.     Allergies  Allergen Reactions  . Brovana [Arformoterol Tartrate] Shortness Of Breath and Cough    General intolerance  . Budesonide Shortness Of Breath and Cough    General intolerance     REVIEW OF SYSTEMS:  GENERAL: no change in appetite, no fatigue, no weight changes, no  fever, chills or weakness EYES: Denies change in vision, dry eyes, eye pain, itching or discharge EARS: Denies change in hearing, ringing in ears, or earache NOSE: Denies nasal  congestion or epistaxis MOUTH and THROAT: Denies oral discomfort, gingival pain or bleeding, pain from teeth or hoarseness   RESPIRATORY: no SOB, DOE, wheezing, hemoptysis, +cough CARDIAC: no chest pain, edema or palpitations GI: no abdominal pain, diarrhea, constipation, heart burn, nausea or vomiting GU: Denies dysuria, frequency, hematuria, incontinence, or discharge PSYCHIATRIC: Denies feeling of depression or anxiety. No report of hallucinations, insomnia, paranoia, or agitation   PHYSICAL EXAMINATION  GENERAL APPEARANCE: Well nourished. In no acute distress. Normal body habitus SKIN:  Skin is warm and dry.  HEAD: Normal in size and contour. No evidence of trauma EYES: Lids open and close normally. No blepharitis, entropion or ectropion. PERRL. Conjunctivae are clear and sclerae are white. Lenses are without opacity EARS: Pinnae are normal. Patient hears normal voice tunes of the examiner MOUTH and THROAT: Lips are without lesions. Oral mucosa is moist and without lesions. Tongue is normal in shape, size, and color and without lesions NECK: supple, trachea midline, no neck masses, no thyroid tenderness, no thyromegaly LYMPHATICS: no LAN in the neck, no supraclavicular LAN RESPIRATORY: breathing is even & unlabored CARDIAC: RRR, no murmur,no extra heart sounds, no edema GI: abdomen soft, normal BS, no masses, no tenderness, no hepatomegaly, no splenomegaly EXTREMITIES:  Able to move X 4 extremities PSYCHIATRIC: Alert and oriented X 3. Affect and behavior are appropriate  LABS/RADIOLOGY: Labs reviewed: 04/09/15  NA 143  K 4.7  Glucose 80  Creatinine 0.83 calcium 8.5 Basic Metabolic Panel:  Recent Labs  16/10/96 0345  03/16/15 0343 03/16/15 1944 03/17/15 0332  03/19/15 0359  03/20/15 0343   03/21/15 0422  03/22/15 0418 03/31/15 04/04/15  NA 142  < > 139  --  141  < > 144  --  145  < > 143  --   --  142 145  K 3.8  < > 5.1  --  4.9  < > 5.3*  < > 4.6  --  4.6  --   --  4.1 2.5*  CL 99*  < > 94*  --  96*  < > 103  --  103  --  105  --   --   --   --   CO2 32  < > 35*  --  34*  < > 33*  --  34*  --  31  --   --   --   --   GLUCOSE 124*  < > 154*  --  134*  < > 146*  --  107*  --  105*  --   --   --   --   BUN 39*  < > 34*  --  33*  < > 47*  --  40*  < > 37*  --   --  42* 41*  CREATININE 1.00  < > 0.77  --  0.80  < > 0.88  --  0.80  --  0.62  < > 0.78 1.0 1.2*  CALCIUM 8.6*  < > 8.8*  --  9.0  < > 8.9  --  8.7*  --  8.7*  --   --   --   --   MG 1.8  < > 1.5* 3.8* 3.4*  --   --   --   --   --   --   --   --   --   --   PHOS 2.9  --  3.1  --   --   --   --   --   --   --   --   --   --   --   --   < > =  values in this interval not displayed. Liver Function Tests:  Recent Labs  04/16/14 2224 11/27/14 0340 12/01/14 0107 03/31/15  AST 16 17 19 15   ALT 9 18 24 15   ALKPHOS 56 49 53 42  BILITOT 0.4 0.6 0.6  --   PROT 6.1 6.0* 6.4*  --   ALBUMIN 3.1* 3.1* 3.5  --     CBC:  Recent Labs  11/26/14 1005  03/15/15 1430 03/16/15 0343  03/17/15 0332 03/31/15 04/04/15  WBC 15.5*  < > 17.2* 17.3*  < > 20.2* 15.7 12.2  NEUTROABS 12.9*  --  13.8*  --   --   --  11  --   HGB 10.9*  < > 10.3* 9.9*  --  10.2* 10.2* 9.8*  HCT 35.9*  < > 36.5 34.6*  --  33.9* 35* 32*  MCV 88.9  < > 92.6 89.9  --  87.8  --   --   PLT 244  < > 251 269  --  284 403* 268  < > = values in this interval not displayed.  Cardiac Enzymes:  Recent Labs  03/15/15 1430  TROPONINI <0.03   CBG:  Recent Labs  03/25/15 0354 03/25/15 0831 03/25/15 1139  GLUCAP 105* 90 102*     Dg Chest 1 View  03/15/2015  CLINICAL DATA:  Short of breath today EXAM: CHEST 1 VIEW COMPARISON:  12/11/2014 FINDINGS: Left basilar airspace disease has increased. Hyperaeration. Normal heart size. No pneumothorax. No pleural  effusion. IMPRESSION: Left basilar airspace disease has increased. This likely represents acute inflammatory disease upon chronic changes. Followup PA and lateral chest X-ray is recommended in 3-4 weeks following trial of antibiotic therapy to ensure resolution and exclude underlying malignancy. Electronically Signed   By: Jolaine Click M.D.   On: 03/15/2015 14:27   Dg Chest 2 View  04/08/2015  CLINICAL DATA:  Followup pneumonia. Cough, shortness breath continues. EXAM: CHEST  2 VIEW COMPARISON:  11/15/2015, chest CT 03/24/2015. FINDINGS: There is hyperinflation of the lungs compatible with COPD. Heart and mediastinal contours are within normal limits. No focal opacities or effusions. No acute bony abnormality. IMPRESSION: COPD.  No active disease.  No visible focal opacities. Electronically Signed   By: Charlett Nose M.D.   On: 04/08/2015 12:00   Ct Chest Wo Contrast  03/24/2015  CLINICAL DATA:  Cough and shortness of breath for several months. Recent pneumonia. EXAM: CT CHEST WITHOUT CONTRAST TECHNIQUE: Multidetector CT imaging of the chest was performed following the standard protocol without IV contrast. COMPARISON:  03/22/2014 FINDINGS: Mediastinum / Lymph Nodes: There is no axillary lymphadenopathy. No mediastinal lymphadenopathy. There is no hilar lymphadenopathy. The heart size is normal. No pericardial effusion. Coronary artery calcification is noted. The esophagus has normal imaging features. Lungs / Pleura: Moderate changes of emphysema noted. No focal airspace consolidation. No suspicious pulmonary nodule or mass. Atelectasis noted in the posterior left lung base with associated small airway impaction. No evidence of pleural effusion. Upper Abdomen: 11 mm low-density lesion upper pole left kidney was present on the previous study measures water attenuation today. Although this cannot be definitively characterize, is likely a cyst. MSK / Soft Tissues: Bone windows reveal no worrisome lytic or  sclerotic osseous lesions. IMPRESSION: 1. Left lower lobe small airway impaction with posterior left lower lobe mild collapse/consolidation. Imaging features may be related to atypical infection. Aspiration is more typically seen in the right lung. 2. Emphysema. 3. Atherosclerosis. Electronically Signed   By: Kennith Center  M.D.   On: 03/24/2015 11:47   Dg Chest Port 1 View  03/18/2015  CLINICAL DATA:  Shortness of breath and lower central chest pain since yesterday. Long time smoker. COPD. EXAM: PORTABLE CHEST 1 VIEW COMPARISON:  Chest radiograph 03/16/2015 and 05/16/2014. CT chest 03/22/2014 FINDINGS: Normal heart size. There is atherosclerotic calcification of the thoracic aortic arch. Trachea is midline. Pulmonary vascularity is normal. There is attenuation of the lung markings near the apices, suggesting emphysema. The lung fields are clear. No airspace disease. No visible pleural effusion. Negative for pneumothorax. No acute osseous abnormality identified. IMPRESSION: Findings consistent with emphysema. No acute superimposed abnormality identified. Electronically Signed   By: Britta Mccreedy M.D.   On: 03/18/2015 11:10   Dg Chest Port 1 View  03/16/2015  CLINICAL DATA:  Acute respiratory failure. History of hypertension, asthma, diabetes and smoking. EXAM: PORTABLE CHEST 1 VIEW COMPARISON:  03/15/2015 FINDINGS: Lungs are hyperexpanded. There is interstitial thickening at the lung bases. The more focal opacity noted at the left lung base on the previous exam is no longer evident. The appearance is non stable from exam is there are far more remote. There are no areas of lung consolidation. No pleural effusion or pneumothorax. Heart is normal in size and configuration. Mild prominence of the hila appears vascular in etiology and stable from the prior exam. No mediastinal masses. IMPRESSION: 1. Area of opacity at the left base seen on the previous exam is not evident currently. 2. There is now chronic  interstitial thickening in the lower lungs as well as lung hyperexpansion consistent with COPD. No convincing acute cardiopulmonary disease. Electronically Signed   By: Amie Portland M.D.   On: 03/16/2015 08:17    ASSESSMENT/PLAN:  Physical deconditioning - for home health PT, OT, CNA and nursing  Chronic respiratory failure - continue O2 and Proventil inhaler when necessary, albuterol nebs when necessary, DuoNeb and 4 times a day  COPD stage IV - continue Proventil when necessary, DuoNeb 4 times a day and O2  Leukocytosis - wbc 12.2, trending down   Anemia of chronic disease - hgb 9.8; stable  Anxiety - mood is stable; continue alprazolam 1 mg 1 tab by mouth 3 times a day when necessary  Protein calorie malnutrition - continue Procel 2 scoops by mouth twice a day  Hypokalemia - K  4.7; decrease KCL ER 20 meq PO Q M-W-F  Esophageal dysmotility - continue Protonix 40 mg daily  Hypertension - well controlled; continue Cozaar 50 mg 1 tab by mouth daily  Diabetes mellitus, type II - hemoglobin A1c 6.3; continue metformin 500 mg 1 tab by mouth twice a day  Diastolic CHF - continue Lasix 20 mg 1 tab by mouth daily and 20 mg by mouth daily when necessary; check BMP  Hyperlipidemia - continue atorvastatin 20 mg 1 tab by mouth daily Lipid Panel     Component Value Date/Time   TRIG 162* 12/03/2014 1634         I have filled out patient's discharge paperwork and written prescriptions.  Patient will receive home health PT, OT, Nursing and CNA.  DME provided:  Rolling walker with seat  Total discharge time: Greater than 30 minutes  Discharge time involved coordination of the discharge process with Child psychotherapist, nursing staff and therapy department. Medical justification for home health services/DME verified.   [facility], NP [employer] [phone]

## 2015-04-15 ENCOUNTER — Emergency Department (HOSPITAL_COMMUNITY): Payer: Medicare Other

## 2015-04-15 ENCOUNTER — Emergency Department (HOSPITAL_COMMUNITY)
Admission: EM | Admit: 2015-04-15 | Discharge: 2015-04-15 | Disposition: A | Discharge status: Home / Self Care | Payer: Medicare Other | Attending: Emergency Medicine | Admitting: Emergency Medicine

## 2015-04-15 ENCOUNTER — Encounter (HOSPITAL_COMMUNITY): Payer: Self-pay | Admitting: Emergency Medicine

## 2015-04-15 DIAGNOSIS — Z7984 Long term (current) use of oral hypoglycemic drugs: Secondary | ICD-10-CM | POA: Diagnosis not present

## 2015-04-15 DIAGNOSIS — F419 Anxiety disorder, unspecified: Secondary | ICD-10-CM | POA: Diagnosis not present

## 2015-04-15 DIAGNOSIS — Z7951 Long term (current) use of inhaled steroids: Secondary | ICD-10-CM | POA: Insufficient documentation

## 2015-04-15 DIAGNOSIS — E119 Type 2 diabetes mellitus without complications: Secondary | ICD-10-CM | POA: Diagnosis not present

## 2015-04-15 DIAGNOSIS — Z862 Personal history of diseases of the blood and blood-forming organs and certain disorders involving the immune mechanism: Secondary | ICD-10-CM | POA: Diagnosis not present

## 2015-04-15 DIAGNOSIS — E46 Unspecified protein-calorie malnutrition: Secondary | ICD-10-CM | POA: Diagnosis not present

## 2015-04-15 DIAGNOSIS — Z79899 Other long term (current) drug therapy: Secondary | ICD-10-CM | POA: Insufficient documentation

## 2015-04-15 DIAGNOSIS — J441 Chronic obstructive pulmonary disease with (acute) exacerbation: Secondary | ICD-10-CM | POA: Insufficient documentation

## 2015-04-15 DIAGNOSIS — I10 Essential (primary) hypertension: Secondary | ICD-10-CM | POA: Insufficient documentation

## 2015-04-15 DIAGNOSIS — E785 Hyperlipidemia, unspecified: Secondary | ICD-10-CM | POA: Insufficient documentation

## 2015-04-15 DIAGNOSIS — R0602 Shortness of breath: Secondary | ICD-10-CM | POA: Diagnosis present

## 2015-04-15 DIAGNOSIS — M1990 Unspecified osteoarthritis, unspecified site: Secondary | ICD-10-CM | POA: Insufficient documentation

## 2015-04-15 DIAGNOSIS — Z8701 Personal history of pneumonia (recurrent): Secondary | ICD-10-CM | POA: Diagnosis not present

## 2015-04-15 DIAGNOSIS — Z7982 Long term (current) use of aspirin: Secondary | ICD-10-CM | POA: Insufficient documentation

## 2015-04-15 DIAGNOSIS — J9611 Chronic respiratory failure with hypoxia: Secondary | ICD-10-CM | POA: Diagnosis not present

## 2015-04-15 DIAGNOSIS — G8929 Other chronic pain: Secondary | ICD-10-CM | POA: Diagnosis not present

## 2015-04-15 LAB — CBC WITH DIFFERENTIAL/PLATELET
Basophils Absolute: 0 10*3/uL (ref 0.0–0.1)
Basophils Relative: 0 %
EOS PCT: 1 %
Eosinophils Absolute: 0.1 10*3/uL (ref 0.0–0.7)
HCT: 32.9 % — ABNORMAL LOW (ref 36.0–46.0)
Hemoglobin: 9.4 g/dL — ABNORMAL LOW (ref 12.0–15.0)
LYMPHS ABS: 0.8 10*3/uL (ref 0.7–4.0)
LYMPHS PCT: 8 %
MCH: 26 pg (ref 26.0–34.0)
MCHC: 28.6 g/dL — AB (ref 30.0–36.0)
MCV: 91.1 fL (ref 78.0–100.0)
MONO ABS: 0.2 10*3/uL (ref 0.1–1.0)
MONOS PCT: 2 %
Neutro Abs: 8.3 10*3/uL — ABNORMAL HIGH (ref 1.7–7.7)
Neutrophils Relative %: 89 %
PLATELETS: 151 10*3/uL (ref 150–400)
RBC: 3.61 MIL/uL — ABNORMAL LOW (ref 3.87–5.11)
RDW: 15.5 % (ref 11.5–15.5)
WBC: 9.5 10*3/uL (ref 4.0–10.5)

## 2015-04-15 LAB — I-STAT CHEM 8, ED
BUN: 28 mg/dL — AB (ref 6–20)
CALCIUM ION: 1.04 mmol/L — AB (ref 1.13–1.30)
CREATININE: 0.9 mg/dL (ref 0.44–1.00)
Chloride: 96 mmol/L — ABNORMAL LOW (ref 101–111)
Glucose, Bld: 111 mg/dL — ABNORMAL HIGH (ref 65–99)
HCT: 34 % — ABNORMAL LOW (ref 36.0–46.0)
Hemoglobin: 11.6 g/dL — ABNORMAL LOW (ref 12.0–15.0)
Potassium: 4.5 mmol/L (ref 3.5–5.1)
Sodium: 138 mmol/L (ref 135–145)
TCO2: 33 mmol/L (ref 0–100)

## 2015-04-15 NOTE — ED Notes (Signed)
Patient in from [facility] with complaints of SOB x2 days. Hx of same. Given 125 Solu Medrol, Albuterol, and Atrovent.

## 2015-04-15 NOTE — ED Notes (Signed)
Bed: WA12 Expected date:  Expected time:  Means of arrival:  Comments: EMS-SOB 

## 2015-04-15 NOTE — ED Notes (Signed)
Emtala done on wrong patient.

## 2015-04-15 NOTE — ED Notes (Signed)
[facility] called  

## 2015-04-15 NOTE — ED Provider Notes (Signed)
CSN: [id number]     Arrival date & time 04/15/15  1014 History   First MD Initiated Contact with Patient 04/15/15 1023     Chief Complaint  Patient presents with  . Shortness of Breath      Patient is a 70 y.o. female presenting with shortness of breath. The history is provided by the patient.  Shortness of Breath Associated symptoms: cough   Associated symptoms: no abdominal pain and no wheezing   Patient presents with shortness of breath. History of COPD and is at nursing home for rehabilitation. Reportedly was supposed be discharged today. Patient states she feels okay. He said a little bit of a cough but this is not unusual for her. She is on chronic oxygen. Reportedly her oxygen machine had broken today. No fevers. No change in sputum production. Sent in from nursing home possibly because a blood gas 2 days ago showed an elevated PCO2. The information I came with the patient does not show any of this. Patient states she feels fine and would like to go back to her house.  Past Medical History  Diagnosis Date  . Hypertension   . Chronic back pain     "goes from the lower part of my back on down my legs"  (04/17/2014)  . Osteopenia   . PVD (peripheral vascular disease) (HCC)   . Tobacco abuse     " I am addicted"   . Vitamin D deficiency disease     03/26/2010 - 9.9ng/mL. Started on replacemnt  . Alcohol abuse, in remission quit 2004  . Neuromuscular disorder (HCC)     lumbar disc  . Anxiety     Xanax prn  . Shortness of breath     with anxiety  and activty  . Asthma   . Hyperlipidemia   . On home oxygen therapy     "2L; suppose to be 24/7" (04/17/2014)  . COPD (chronic obstructive pulmonary disease) (HCC)     cxr 04/03/2010 - hyperinflation  . Pneumonia     "3 times in the last month or so" (04/17/2014)  . Type II diabetes mellitus (HCC)   . Arthritis     "my whole body"  . Physical deconditioning   . Chronic respiratory failure with hypercapnia (HCC)   . HCAP  (healthcare-associated pneumonia)   . COPD exacerbation (HCC)   . Leukocytosis   . Dysphagia   . Protein calorie malnutrition (HCC)   . Esophageal dysmotility   . Anemia of chronic disease    Past Surgical History  Procedure Laterality Date  . Lumbar laminectomy/decompression microdiscectomy  2005    L4-5/notes 03/06/2010  . Appendectomy  1960  . Tonsillectomy  1960  . Posterior lumbar fusion  2002  . Abdominal hysterectomy      "partial"  . Back surgery    . Carpal tunnel release Bilateral     CTS repair 2000 (R), 2006 (L)/notes 01/01/2014  . Cataract extraction w/ intraocular lens  implant, bilateral Bilateral 2015   Family History  Problem Relation Age of Onset  . Heart attack Sister 19    CABG  . Breast cancer Sister   . Cancer Brother   . Heart attack Brother   . Hypertension Other     all siblings  . Anesthesia problems Neg Hx    Social History  Substance Use Topics  . Smoking status: Former Smoker -- 0.25 packs/day for 56 years    Types: Cigarettes    Quit date: 01/27/2015  .  Smokeless tobacco: Never Used  . Alcohol Use: 0.0 oz/week    0 Standard drinks or equivalent per week     Comment: h/o heavy ETOH quit 2004   OB History    No data available     Review of Systems  Constitutional: Negative for appetite change and fatigue.  Respiratory: Positive for cough and shortness of breath. Negative for wheezing.   Gastrointestinal: Negative for abdominal pain.  Genitourinary: Negative for dysuria.  Musculoskeletal: Negative for back pain.  Skin: Negative for wound.  Neurological: Negative for numbness.      Allergies  Brovana and Budesonide  Home Medications   Prior to Admission medications   Medication Sig Start Date End Date Taking? Authorizing Provider  acetaminophen (TYLENOL) 500 MG tablet Take 500-1,000 mg by mouth every 8 (eight) hours as needed (for pain).   Yes Historical Provider, MD  albuterol (PROVENTIL HFA;VENTOLIN HFA) 108 (90 BASE)  MCG/ACT inhaler Inhale 2 puffs into the lungs every 4 (four) hours as needed for wheezing or shortness of breath.    Yes Historical Provider, MD  albuterol (PROVENTIL) (2.5 MG/3ML) 0.083% nebulizer solution Take 3 mLs (2.5 mg total) by nebulization every 4 (four) hours as needed for wheezing or shortness of breath. (may take 1 every 4 hours as needed for wheezing/shortness of breath) DX: 496 Patient taking differently: Take 2.5 mg by nebulization every 4 (four) hours as needed for wheezing or shortness of breath. DX: 496 01/09/14  Yes Martha ClanWilliam Shaw, MD  ALPRAZolam Prudy Feeler(XANAX) 1 MG tablet Take 1 mg by mouth 3 (three) times daily as needed for anxiety.   Yes Historical Provider, MD  aspirin EC 81 MG EC tablet Take 1 tablet (81 mg total) by mouth daily. 01/09/14  Yes Martha ClanWilliam Shaw, MD  atorvastatin (LIPITOR) 20 MG tablet Take 20 mg by mouth daily. evening 09/14/14  Yes Historical Provider, MD  fluticasone (FLONASE) 50 MCG/ACT nasal spray Place 1 spray into both nostrils 2 (two) times daily. 12/23/14  Yes Sharee Holstereborah S Green, NP  furosemide (LASIX) 20 MG tablet Take 20 mg by mouth See admin instructions. Daily in the evening and as needed for edema   Yes Historical Provider, MD  furosemide (LASIX) 40 MG tablet Take 40 mg by mouth every morning.   Yes Historical Provider, MD  guaiFENesin-dextromethorphan (ROBITUSSIN DM) 100-10 MG/5ML syrup Take 10 mLs by mouth every 6 (six) hours as needed for cough.   Yes Historical Provider, MD  Hydrocod Polst-Chlorphen Polst [facility](TUSSIONEX PENNKINETIC ER PO) Take 5 mLs by mouth 2 (two) times daily as needed (cough).    Yes Historical Provider, MD  ipratropium-albuterol (DUONEB) 0.5-2.5 (3) MG/3ML SOLN Take 3 mLs by nebulization 4 (four) times daily. 03/25/15  Yes Simonne MartinetPeter E Babcock, NP  losartan (COZAAR) 50 MG tablet Take 50 mg by mouth daily.   Yes Historical Provider, MD  metFORMIN (GLUCOPHAGE) 500 MG tablet TAKE 1 TABLET BY MOUTH TWICE DAILY 01/07/15  Yes Sharee Holstereborah S Green, NP  Multiple  Vitamin (MULTIVITAMIN) tablet Take 1 tablet by mouth daily.   Yes Historical Provider, MD  potassium chloride SA (K-DUR,KLOR-CON) 20 MEQ tablet Take 1 tablet (20 mEq total) by mouth daily. Take while on lasix 12/12/14  Yes Jeanella CrazeBrandi L Ollis, NP  pantoprazole (PROTONIX) 40 MG tablet TAKE 1 TABLET BY MOUTH EVERY NIGHT Patient not taking: Reported on 04/15/2015 01/07/15   Sharee Holstereborah S Green, NP  Protein (PROCEL 100) POWD Take 2 scoop by mouth 2 (two) times daily.     Historical Provider,  MD   BP 120/56 mmHg  Pulse 95  Temp(Src) 98.5 F (36.9 C) (Oral)  Resp 22  SpO2 100% Physical Exam  Constitutional: She appears well-developed.  HENT:  Head: Atraumatic.  Neck: Neck supple.  Cardiovascular: Normal rate.   Pulmonary/Chest:  Diffuse harsh breath sounds without respiratory distress.  Abdominal: Soft. There is no tenderness.  Musculoskeletal: Normal range of motion.  Neurological: She is alert.  Skin: Skin is warm.  Psychiatric: She has a normal mood and affect.    ED Course  Procedures (including critical care time) Labs Review Labs Reviewed  CBC WITH DIFFERENTIAL/PLATELET - Abnormal; Notable for the following:    RBC 3.61 (*)    Hemoglobin 9.4 (*)    HCT 32.9 (*)    MCHC 28.6 (*)    Neutro Abs 8.3 (*)    All other components within normal limits  I-STAT CHEM 8, ED - Abnormal; Notable for the following:    Chloride 96 (*)    BUN 28 (*)    Glucose, Bld 111 (*)    Calcium, Ion 1.04 (*)    Hemoglobin 11.6 (*)    HCT 34.0 (*)    All other components within normal limits    Imaging Review Dg Chest 2 View  04/15/2015  CLINICAL DATA:  Cough, wheezing, lung cracklings, anxiety attack EXAM: CHEST  2 VIEW COMPARISON:  04/08/2015 FINDINGS: Cardiomediastinal silhouette is stable. Mild hyperinflation again noted. No acute infiltrate or pleural effusion. No pulmonary edema. Bony thorax is stable. IMPRESSION: No active disease.  Hyperinflation again noted. Electronically Signed   By: Natasha Mead  M.D.   On: 04/15/2015 12:04   I have personally reviewed and evaluated these images and lab results as part of my medical decision-making.   EKG Interpretation None      MDM   Final diagnoses:  Chronic respiratory failure with hypoxia [facility])    Patient presents with shortness of breath. States she is at her baseline. Was to be discharged to nursing home today. She is at her baseline in terms of hypoxia. Will discharge home. X-ray stable.    Benjiman Core, MD 04/15/15 1318

## 2015-04-15 NOTE — Progress Notes (Signed)
ED Cm Received call from [facility] staff member, Jarold Mottoim  [place] [facility]

## 2015-04-15 NOTE — Discharge Instructions (Signed)
Chronic Respiratory Failure Respiratory failure is when your lungs are not working well and your breathing (respiratory) system fails. When respiratory failure occurs, it is difficult for your lungs to get enough oxygen or get rid of carbon dioxide or both. Respiratory failure can be life threatening.  Respiratory failure can be acute or chronic. Acute respiratory failure is sudden, severe, and requires emergency medical treatment. Chronic respiratory failure is less severe, happens over time, and requires ongoing treatment.  CAUSES  Any problem affecting the heart or lungs can cause respiratory failure. Some of these causes may be:  Chronic bronchitis and emphysema (COPD).  Blood clot going to the lung (pulmonary embolism).  Having water in the lungs caused by heart failure, lung injury, or infection (pulmonary edema).  Collapsed lung (pneumothorax).  Pneumonia.  Pulmonary fibrosis.  Obesity.  Asthma.  Heart failure.  Any type of trauma to the chest that can make breathing difficult.  Nerve or muscle diseases making chest movements difficult. SYMPTOMS  Signs and symptoms of chronic respiratory failure include:  Shortness of breath (dyspnea) with or without activity.  Rapid, fast breathing (tachypnea).  Wheezing.  Fast heart rate.  Bluish color to the fingernail or toenail beds.  Confusion or drowsiness or both. DIAGNOSIS  Initial diagnosis requires a thorough history and a physical exam by your health care provider. Additional tests may include:  Chest X-ray.  CT scan of your lungs.  Ultrasound to check for blood clots.  Blood tests, such as an arterial blood gas test (ABG). This is a blood test that looks at the oxygen and carbon dioxide levels in your arterial blood.  Your vital signs will be taken. This includes your respiratory rate (how many times a minute you are breathing), oxygen saturation (this measures the oxygen level in your blood), heart rate, and  blood pressure. These numbers help your health care provider determine the next steps.  Electrocardiogram. TREATMENT  Treatment of chronic respiratory failure depends on the cause of the respiratory failure. Treatment can include the following:  Oxygen. Oxygen can be delivered through the following:  Nasal cannula. This is small tubing that goes in your nose to give you oxygen.  Face mask. A face mask covers your nose and mouth to give you oxygen.  Medicine. Different medicines can be given to help with breathing. These can include:  Nebulizers. Nebulizers deliver medicines to open the air passages (bronchodilators). These medicines help to open or relax the airways in the lungs so you can breathe better. They can also help loosen mucus from your lungs.  Diuretics. Diuretic medicines can help you breathe better by getting rid of extra fluid in your body.  Steroids. Steroid medicines can help decrease inflammation in your lungs.  Chest tube. If you have a collapsed lung (pneumothorax), a chest tube is placed to help reinflate the lung.  Noninvasive positive pressure ventilation (NPPV). This is a tight-fitting mask that goes over your nose and mouth. The mask has tubing that is attached to a machine. The machine blows air into the tubing, which helps to keep the tiny air sacs (alveoli) in your lungs open. This machine allows you to breathe on your own.  Ventilator. A ventilator is a breathing machine. When on a ventilator, a breathing tube is put into the lungs. A ventilator is used when you can no longer breathe well enough on your own. You may have low oxygen levels or high carbon dioxide (CO2) levels in your blood. When you are on a   ventilator, sedation and pain medicines are given to make you sleep so your lungs can heal. HOME CARE INSTRUCTIONS  Follow your health care provider's directions about medicines and respiratory therapy.  Quit smoking if you smoke. SEEK MEDICAL CARE  IF:  You have increasing shortness of breath and are less functional than you have been.  You have increased sputum, wheezing, coughing, or loss of energy.  You are on oxygen and are requiring more. SEEK IMMEDIATE MEDICAL CARE IF:  Your shortness of breath is significantly worse.  You are unable to say more than a few words without having to catch your breath.  You are much less functional. MAKE SURE YOU:  Understand these instructions.  Will watch your condition.  Will get help right away if you are not doing well or get worse.   This information is not intended to replace advice given to you by your health care provider. Make sure you discuss any questions you have with your health care provider.   Document Released: 01/11/2005 Document Revised: 10/02/2014 Document Reviewed: 11/09/2012 Elsevier Interactive Patient Education 2016 Elsevier Inc.  

## 2015-04-16 ENCOUNTER — Other Ambulatory Visit: Payer: Self-pay | Admitting: Adult Health

## 2015-04-28 ENCOUNTER — Encounter: Payer: Self-pay | Admitting: Internal Medicine

## 2015-04-28 ENCOUNTER — Ambulatory Visit (INDEPENDENT_AMBULATORY_CARE_PROVIDER_SITE_OTHER): Payer: Medicare Other | Admitting: Internal Medicine

## 2015-04-28 VITALS — BP 122/54 | HR 105 | Ht 65.0 in | Wt 123.0 lb

## 2015-04-28 DIAGNOSIS — J9611 Chronic respiratory failure with hypoxia: Secondary | ICD-10-CM | POA: Diagnosis not present

## 2015-04-28 DIAGNOSIS — J9612 Chronic respiratory failure with hypercapnia: Secondary | ICD-10-CM | POA: Diagnosis not present

## 2015-04-28 DIAGNOSIS — F17200 Nicotine dependence, unspecified, uncomplicated: Secondary | ICD-10-CM

## 2015-04-28 DIAGNOSIS — Z720 Tobacco use: Secondary | ICD-10-CM | POA: Diagnosis not present

## 2015-04-28 DIAGNOSIS — J449 Chronic obstructive pulmonary disease, unspecified: Secondary | ICD-10-CM

## 2015-04-28 NOTE — Progress Notes (Signed)
Subjective:     Patient ID: Nicole Maxwell, female   DOB: Apr 02, 1945, 70 y.o.   MRN: [id number]  HPI  70 yo female former smoker with COPD and O2 dependent RF.   TEST  #COPD - Oxygen dependent since 2011  - Doubtamine stress echo 05/12/10 - normal, CXR July 2011 - clear  - PFTs 04/13/2010 - shows Gold stage 4 COPD with BD response/asthma component (fev1 0.5L/29%, 10% BD response on fev1 and 38% on FC), DLCO 27%)  - Spirometry Jan 2013 - fev1 0.74L/37%, RAtop 44 - shows gold stage 3 copd - CAT score - 23 in May 2013 - CAT score 19 in NOv 2013   #COPD - Oxygen dependent since 2011  - Doubtamine stress echo 05/12/10 - normal, CXR July 2011 - clear  - PFTs 04/13/2010 - shows Gold stage 4 COPD with BD response/asthma component (fev1 0.5L/29%, 10% BD response on fev1 and 38% on FC), DLCO 27%)  - Spirometry Jan 2013 - fev1 0.74L/37%, RAtop 44 - shows gold stage 3 copd  - Class 2-3 exertional dyspnea with precipitants emotion, smoke, lying flat, weather change, pollen, odors  - Chronic cough with white sputjum  - CAT score - 23 in May 2013 - CAT score 19 in NOv 2013  #Smoking  #Chronic back pain - s/p lumbar fusion surgery ? Date  - Preop pulmonary evaluatio prior to spine surgery - 2013, surgery by Dr Danielle DessElsner never happend  #There is no history of allergies, aspirin allergies, asthma, bronchiolitis, CAD, DVT, a heart failure, PE, pneumonia  # Poor ability to understand questions and answer them. Difficulty following med advice  - noticed since march 2013  OV 01/11/2012    Presents with husband. Totally unclear again what her main issue is. ON CAT score is seems ithe baseline range of 18-23 with score today being 22. She admits to no changein baseline symptoms . She showed her med calendraw and swore she is on albuterol + atrovent scheduled + qvar mdi 2 puff bid. But she again points to [place] and [facility] as having worke well for her. But when I asked her if she wants to switch to them she  has no answer. She says "well it is too costly". I asked her if nebss not working well for her and she could not answer the question one way or another.  Of note, she continues to attend rehab  Repeat pattern of not answering questions directly nor asking questions. Her husband too chided her for this. No one in office can understand what her concerns are. I have asked her to write questions down in future before doc visit   Still smoking: Unable to quit   02/11/12 follow up  Returns for follow up and med review  pt brought all meds with her today.  c/o nebs being too expensive thru mailorder Pt gets confused very easily with detail. Unclear if she is taking med correctly .  She says mail order is very expensive for nebs . Inhalers were also too expensive .  She agreed to try DME to see if more cost effective No flare in cough or dyspnea.  No edema.  >>retrial of pulmicort   03/24/2012 Follow up COPD  Returns 6 week for her COPD Last visit. We restarted a trial of Pulmicort. Her nebulizers Unable to tolerate budesonide feels it make her worse.  She is Still smoking and does not have any interest in quitting smoking We discussed the dangers of smoking  and also smoking around oxygen. She denies any hemoptysis, orthopnea, PND, or leg swelling She had a recent COPD flare with bronchitis. 3 weeks ago and was given prednisone and doxycycline, which seemed to help her symptoms.   OV 08/01/2012 FU COPD and smoking  Denies any acout or subacute symptomatology. We had her do med calendar and had her on nebs. But again, she says her copd meds are spiriva and symbicort; she brought these inhalers with her. I cannot figure out from her what her real meds are. She is keen on getting samples.She says she quit smoking but then her last cigarette was this morning!!!    05/16/2014 Post hospital follow up : COPD and Chronic Resp Failure on O2 Pt returns for post hospital follow up .  Admitted March 22  through March 24 for acute on chronic respiratory failure with recurrent healthcare associated pneumonia. She had been admitted also in February for severe COPD exacerbation. She was treated with IV antibiotics. And transition to antibiotics. At discharge. She is feeling improved with decreased cough and shortness of breath. She does have some lingering cough and intermittent wheezing but this also is much better. CXR today showed cleared PNA.  She does continue to smoke. We discussed smoking cessation. She denies any hemoptysis, orthopnea, PND or leg swelling. Speech swallow eval showed mild dysphagia      OV 07/01/2014  Chief Complaint  Patient presents with  . Follow-up    Pt last seen by MR on 08/01/2012. Pt saw TP on 05/16/14 for HFU for HCAP. Pt stated she feel her breahting has worsened since last OV. Pt c/o increase SOB, prod cough with light yellow mucus, epigastric pain.     70 year old female with severe COPD and chronic respiratory failure oxygen dependent. I personally have not seen her since 2014. I last saw her when I ran into her at a wedding  over a year ago. She was doing fine up until earlier this year and since then has had 3 admissions according to her and granddaughter in the last 6 months all respiratory related. Now significantly deconditioned. She did get some home physical therapy and this helped somewhat. She expresses dissatisfaction with some of the physical therapy providers and though she acknowledges she needs physical therapy again she is very specific as to who can come and help her. Currently she is on triple inhaler therapy and starting January 2016 primary care physician has started her on daily prednisone 5 mg per day.   She continues to smoke.   Overall she is expressing moderate amount of fatigue, exhaustion cough and dyspnea all of which are baseline. There are no COPD exacerbation symptoms   After much discussion - agrees to home PT at grand daughter  insistence   04/08/2015 [facility] follow up  Pt returns for a post hospital follow up .  She was admitted for COPD flare  , hypercarbic RF, +rhinovirus and LLL PNA w/ pansensitive pseudomonas and resp failure requiring vent. She was discharged to SNF. Recommended for nocturnal BIPAP , smoking cessation. Prednisone taper.  She remains very weak. Gets winded with minimal act. Stil has congested cough, no fever or hemoptyiss  CXR today shows COPD w/ no acute dz.  Seen cardiology yesterday , lasix decreased  daily .  She has worn the BIPAP for couple of days. But none recently . Advised on importance of wearing At bedtime .  Daughter is with her today and is going to go talk with  DON at SNF to make sure her mom is getting the treatment she is suppose to . They are not very happy with facility.  Denies chest pain, orthopnea, edema or fever.  No smoking x 1 month .     OV 04/28/2015  Chief Complaint  Patient presents with  . Follow-up    Still having SOB, occ wheezing, cough with yellow mucus at first now white.  denies any cp/tightness. Pt is having trouble putting CPAP mask on will leak air sometimes.    End-stage COPD with chronic hypoxemic respiratory failure uses nocturnal BiPAP. Had admission and affect are 2017 the rhinovirus COPD exacerbation complicated by Pseudomonas pneumonia. Echocardiogram was normal. CT chest did not show any cancers. She was discharged to a skilled nursing facility. She is now stronger. She is back home but still very deconditioned. She lives alone. She has some problems with the BiPAP mask but overall is progressing well. Still a lot more deconditioned and this is a new baseline compared to a few years ago. Daughter is with her. Daughter is interested in home palliative care recommended by primary care physician but they do not have the referral. COP CAT score now at all time high of 31   CAT COPD Symptom and Quality of Life Score ([employer]  trademark) 10/08/2011  01/11/2012  08/01/2012  04/28/2015   Never Cough -> Cough all the time 1 2 3 4   No phlegm in chest -> Chest is full of phlegm 1 2 2 3   No chest tightness -> Chest feels very tight 1 2 2 3   No dyspnea for 1 flight stairs/hill -> Very dyspneic for 1 flight of stairs 4 4 5 5   No limitations for ADL at home -> Very limited with ADL at home 4 3 5 5   Confident leaving home -> Not at all confident leaving home 1 2 2 3   Sleep soundly -> Do not sleep soundly because of lung condition 3 4 3 4   Lots of Energy -> No energy at all 3 3 4 4   TOTAL Score (max 40)  18 22 26 31     Review of Systems     Objective:   Physical Exam  Filed Vitals:   04/28/15 1117  BP: 122/54  Pulse: 105  Height: 5\' 5"  (1.651 m)  Weight: 123 lb (55.792 kg)  SpO2: 90%  General exam: Frail female he did looks more deconditioned than a few years ago. However improve then hospital stay HEENT: Baseline chronic cough present that sounds wet. No elevated JVP no neck nodes. Respiratory exam: Overall entry diminished. Mild barrel chest. Mild purse her breathing present no accessory muscle use. No distress. Scattered wheeze present baseline  cardiovascular: Normal heart sounds regular rate and rhythm Abdomen: Soft nontender Musculoskeletal: Cachectic and has a walker. No sinus no clubbing no edema       Assessment:       ICD-9-CM ICD-10-CM   1. Chronic respiratory failure with hypoxia and hypercapnia (HCC) 518.83 J96.11    799.02 J96.12    786.09    2. Severe chronic obstructive pulmonary disease (HCC) 496 J44.9   3. Smoking 305.1 Z72.0        Plan:      Glad you are better but you are at new low baseline since few years ago c/w steady decline Refer home pallaitive care Do COPD CAT score 04/28/2015 Continue home PT, oxygen, nocturnal BiPAP, and nebulizers  - call advanced to get your face mask  adjusted  Followup  2-3 months with Dr Marchelle Gearing or sooner if needed     Dr. Kalman Shan, M.D., [facility]C.P Pulmonary and Critical Care Medicine Staff Physician  System [place] [facility] Pager: [phone], If no answer or between  15:00h - 7:00h: call [phone]  04/28/2015 12:00 PM

## 2015-04-28 NOTE — Patient Instructions (Signed)
ICD-9-CM ICD-10-CM   1. Chronic respiratory failure with hypoxia and hypercapnia (HCC) 518.83 J96.11    799.02 J96.12    786.09    2. Severe chronic obstructive pulmonary disease (HCC) 496 J44.9   3. Smoking 305.1 Z72.0    Glad you are better Refer home pallaitive care Do COPD CAT score 04/28/2015 Continue home PT, oxygen, nocturnal BiPAP, and nebulizers  - call advanced to get your face mask adjusted  Followup  2-3 months with Dr Marchelle Gearingamaswamy or sooner if needed

## 2015-05-14 ENCOUNTER — Other Ambulatory Visit: Payer: Self-pay | Admitting: Adult Health

## 2015-05-15 ENCOUNTER — Other Ambulatory Visit: Payer: Self-pay | Admitting: Internal Medicine

## 2015-05-16 ENCOUNTER — Other Ambulatory Visit: Payer: Self-pay | Admitting: Adult Health

## 2015-06-30 ENCOUNTER — Inpatient Hospital Stay (HOSPITAL_COMMUNITY)
Admission: EM | Admit: 2015-06-30 | Discharge: 2015-07-06 | DRG: 190 | Disposition: A | Discharge status: Home Health | Payer: Medicare Other | Attending: Internal Medicine | Admitting: Internal Medicine

## 2015-06-30 ENCOUNTER — Encounter (HOSPITAL_COMMUNITY): Payer: Self-pay

## 2015-06-30 ENCOUNTER — Emergency Department (HOSPITAL_COMMUNITY): Payer: Medicare Other

## 2015-06-30 DIAGNOSIS — Z961 Presence of intraocular lens: Secondary | ICD-10-CM | POA: Diagnosis present

## 2015-06-30 DIAGNOSIS — J9621 Acute and chronic respiratory failure with hypoxia: Secondary | ICD-10-CM | POA: Diagnosis present

## 2015-06-30 DIAGNOSIS — J449 Chronic obstructive pulmonary disease, unspecified: Secondary | ICD-10-CM | POA: Diagnosis present

## 2015-06-30 DIAGNOSIS — Z9842 Cataract extraction status, left eye: Secondary | ICD-10-CM

## 2015-06-30 DIAGNOSIS — Z981 Arthrodesis status: Secondary | ICD-10-CM

## 2015-06-30 DIAGNOSIS — J189 Pneumonia, unspecified organism: Secondary | ICD-10-CM | POA: Diagnosis present

## 2015-06-30 DIAGNOSIS — I110 Hypertensive heart disease with heart failure: Secondary | ICD-10-CM | POA: Diagnosis present

## 2015-06-30 DIAGNOSIS — K224 Dyskinesia of esophagus: Secondary | ICD-10-CM | POA: Diagnosis present

## 2015-06-30 DIAGNOSIS — Z7952 Long term (current) use of systemic steroids: Secondary | ICD-10-CM

## 2015-06-30 DIAGNOSIS — R0602 Shortness of breath: Secondary | ICD-10-CM | POA: Diagnosis not present

## 2015-06-30 DIAGNOSIS — I5032 Chronic diastolic (congestive) heart failure: Secondary | ICD-10-CM | POA: Diagnosis not present

## 2015-06-30 DIAGNOSIS — D649 Anemia, unspecified: Secondary | ICD-10-CM | POA: Diagnosis present

## 2015-06-30 DIAGNOSIS — Z09 Encounter for follow-up examination after completed treatment for conditions other than malignant neoplasm: Secondary | ICD-10-CM

## 2015-06-30 DIAGNOSIS — M549 Dorsalgia, unspecified: Secondary | ICD-10-CM | POA: Diagnosis present

## 2015-06-30 DIAGNOSIS — Z7984 Long term (current) use of oral hypoglycemic drugs: Secondary | ICD-10-CM

## 2015-06-30 DIAGNOSIS — I739 Peripheral vascular disease, unspecified: Secondary | ICD-10-CM | POA: Diagnosis present

## 2015-06-30 DIAGNOSIS — D638 Anemia in other chronic diseases classified elsewhere: Secondary | ICD-10-CM | POA: Diagnosis present

## 2015-06-30 DIAGNOSIS — Z720 Tobacco use: Secondary | ICD-10-CM | POA: Diagnosis present

## 2015-06-30 DIAGNOSIS — J9811 Atelectasis: Secondary | ICD-10-CM | POA: Diagnosis present

## 2015-06-30 DIAGNOSIS — G9340 Encephalopathy, unspecified: Secondary | ICD-10-CM | POA: Diagnosis not present

## 2015-06-30 DIAGNOSIS — Z8249 Family history of ischemic heart disease and other diseases of the circulatory system: Secondary | ICD-10-CM

## 2015-06-30 DIAGNOSIS — J441 Chronic obstructive pulmonary disease with (acute) exacerbation: Principal | ICD-10-CM | POA: Diagnosis present

## 2015-06-30 DIAGNOSIS — Z803 Family history of malignant neoplasm of breast: Secondary | ICD-10-CM

## 2015-06-30 DIAGNOSIS — J440 Chronic obstructive pulmonary disease with acute lower respiratory infection: Secondary | ICD-10-CM | POA: Diagnosis present

## 2015-06-30 DIAGNOSIS — Y92019 Unspecified place in single-family (private) house as the place of occurrence of the external cause: Secondary | ICD-10-CM

## 2015-06-30 DIAGNOSIS — E119 Type 2 diabetes mellitus without complications: Secondary | ICD-10-CM

## 2015-06-30 DIAGNOSIS — F1021 Alcohol dependence, in remission: Secondary | ICD-10-CM | POA: Diagnosis present

## 2015-06-30 DIAGNOSIS — D72829 Elevated white blood cell count, unspecified: Secondary | ICD-10-CM | POA: Diagnosis present

## 2015-06-30 DIAGNOSIS — Z9981 Dependence on supplemental oxygen: Secondary | ICD-10-CM

## 2015-06-30 DIAGNOSIS — Z7951 Long term (current) use of inhaled steroids: Secondary | ICD-10-CM

## 2015-06-30 DIAGNOSIS — E0951 Drug or chemical induced diabetes mellitus with diabetic peripheral angiopathy without gangrene: Secondary | ICD-10-CM | POA: Diagnosis present

## 2015-06-30 DIAGNOSIS — J9620 Acute and chronic respiratory failure, unspecified whether with hypoxia or hypercapnia: Secondary | ICD-10-CM | POA: Diagnosis present

## 2015-06-30 DIAGNOSIS — J9622 Acute and chronic respiratory failure with hypercapnia: Secondary | ICD-10-CM | POA: Diagnosis present

## 2015-06-30 DIAGNOSIS — F419 Anxiety disorder, unspecified: Secondary | ICD-10-CM | POA: Diagnosis present

## 2015-06-30 DIAGNOSIS — I10 Essential (primary) hypertension: Secondary | ICD-10-CM | POA: Diagnosis present

## 2015-06-30 DIAGNOSIS — E785 Hyperlipidemia, unspecified: Secondary | ICD-10-CM | POA: Diagnosis present

## 2015-06-30 DIAGNOSIS — Z9119 Patient's noncompliance with other medical treatment and regimen: Secondary | ICD-10-CM

## 2015-06-30 DIAGNOSIS — Z87891 Personal history of nicotine dependence: Secondary | ICD-10-CM

## 2015-06-30 DIAGNOSIS — Z888 Allergy status to other drugs, medicaments and biological substances status: Secondary | ICD-10-CM

## 2015-06-30 DIAGNOSIS — Z7982 Long term (current) use of aspirin: Secondary | ICD-10-CM

## 2015-06-30 DIAGNOSIS — G8929 Other chronic pain: Secondary | ICD-10-CM | POA: Diagnosis present

## 2015-06-30 DIAGNOSIS — T380X5A Adverse effect of glucocorticoids and synthetic analogues, initial encounter: Secondary | ICD-10-CM | POA: Diagnosis present

## 2015-06-30 DIAGNOSIS — Z9841 Cataract extraction status, right eye: Secondary | ICD-10-CM

## 2015-06-30 LAB — CBC WITH DIFFERENTIAL/PLATELET
Basophils Absolute: 0 10*3/uL (ref 0.0–0.1)
Basophils Relative: 0 %
Eosinophils Absolute: 0.1 10*3/uL (ref 0.0–0.7)
Eosinophils Relative: 1 %
HEMATOCRIT: 36.1 % (ref 36.0–46.0)
HEMOGLOBIN: 9.5 g/dL — AB (ref 12.0–15.0)
LYMPHS PCT: 14 %
Lymphs Abs: 2.4 10*3/uL (ref 0.7–4.0)
MCH: 23.4 pg — ABNORMAL LOW (ref 26.0–34.0)
MCHC: 26.3 g/dL — AB (ref 30.0–36.0)
MCV: 88.9 fL (ref 78.0–100.0)
MONO ABS: 0.7 10*3/uL (ref 0.1–1.0)
MONOS PCT: 4 %
NEUTROS ABS: 14.3 10*3/uL — AB (ref 1.7–7.7)
Neutrophils Relative %: 81 %
Platelets: 285 10*3/uL (ref 150–400)
RBC: 4.06 MIL/uL (ref 3.87–5.11)
RDW: 15.3 % (ref 11.5–15.5)
WBC: 17.5 10*3/uL — ABNORMAL HIGH (ref 4.0–10.5)

## 2015-06-30 LAB — BLOOD GAS, ARTERIAL
Acid-Base Excess: 9.3 mmol/L — ABNORMAL HIGH (ref 0.0–2.0)
Bicarbonate: 34.5 mEq/L — ABNORMAL HIGH (ref 20.0–24.0)
DRAWN BY: 31101
O2 CONTENT: 2 L/min
O2 Saturation: 98 %
PCO2 ART: 58.5 mmHg — AB (ref 35.0–45.0)
PH ART: 7.388 (ref 7.350–7.450)
PO2 ART: 107 mmHg — AB (ref 80.0–100.0)
Patient temperature: 98.6
TCO2: 36.3 mmol/L (ref 0–100)

## 2015-06-30 LAB — TSH: TSH: 0.273 u[IU]/mL — ABNORMAL LOW (ref 0.350–4.500)

## 2015-06-30 LAB — RESPIRATORY PANEL BY PCR
ADENOVIRUS-RVPPCR: NOT DETECTED
Bordetella pertussis: NOT DETECTED
CHLAMYDOPHILA PNEUMONIAE-RVPPCR: NOT DETECTED
CORONAVIRUS 229E-RVPPCR: NOT DETECTED
CORONAVIRUS HKU1-RVPPCR: NOT DETECTED
CORONAVIRUS NL63-RVPPCR: NOT DETECTED
CORONAVIRUS OC43-RVPPCR: NOT DETECTED
INFLUENZA A H3-RVPPCR: NOT DETECTED
INFLUENZA A-RVPPCR: NOT DETECTED
INFLUENZA B-RVPPCR: NOT DETECTED
Influenza A H1 2009: NOT DETECTED
Influenza A H1: NOT DETECTED
MYCOPLASMA PNEUMONIAE-RVPPCR: NOT DETECTED
Metapneumovirus: NOT DETECTED
PARAINFLUENZA VIRUS 1-RVPPCR: NOT DETECTED
PARAINFLUENZA VIRUS 4-RVPPCR: NOT DETECTED
Parainfluenza Virus 2: NOT DETECTED
Parainfluenza Virus 3: NOT DETECTED
Respiratory Syncytial Virus: NOT DETECTED
Rhinovirus / Enterovirus: NOT DETECTED

## 2015-06-30 LAB — VITAMIN B12: VITAMIN B 12: 716 pg/mL (ref 180–914)

## 2015-06-30 LAB — BASIC METABOLIC PANEL
ANION GAP: 10 (ref 5–15)
BUN: 6 mg/dL (ref 6–20)
CALCIUM: 9.6 mg/dL (ref 8.9–10.3)
CHLORIDE: 96 mmol/L — AB (ref 101–111)
CO2: 37 mmol/L — AB (ref 22–32)
CREATININE: 0.67 mg/dL (ref 0.44–1.00)
GFR calc Af Amer: >60 mL/min (ref >60)
GFR calc non Af Amer: >60 mL/min (ref >60)
GLUCOSE: 126 mg/dL — AB (ref 65–99)
Potassium: 4.2 mmol/L (ref 3.5–5.1)
Sodium: 143 mmol/L (ref 135–145)

## 2015-06-30 LAB — I-STAT ARTERIAL BLOOD GAS, ED
Acid-Base Excess: 13 mmol/L — ABNORMAL HIGH (ref 0.0–2.0)
Bicarbonate: 41.2 mEq/L — ABNORMAL HIGH (ref 20.0–24.0)
O2 SAT: 32 %
PCO2 ART: 65.1 mmHg — AB (ref 35.0–45.0)
PO2 ART: 21 mmHg — AB (ref 80.0–100.0)
Patient temperature: 98.1
TCO2: 43 mmol/L (ref 0–100)
pH, Arterial: 7.409 (ref 7.350–7.450)

## 2015-06-30 LAB — GLUCOSE, CAPILLARY: Glucose-Capillary: 320 mg/dL — ABNORMAL HIGH (ref 65–99)

## 2015-06-30 LAB — BRAIN NATRIURETIC PEPTIDE: B Natriuretic Peptide: 133.7 pg/mL — ABNORMAL HIGH (ref 0.0–100.0)

## 2015-06-30 MED ORDER — FUROSEMIDE 40 MG PO TABS
40.0000 mg | ORAL_TABLET | ORAL | Status: DC
  Administered 2015-07-01 – 2015-07-06 (×6): 40 mg via ORAL
  Filled 2015-06-30 (×7): qty 1

## 2015-06-30 MED ORDER — ONDANSETRON HCL 4 MG/2ML IJ SOLN
4.0000 mg | Freq: Four times a day (QID) | INTRAMUSCULAR | Status: DC | PRN
Start: 2015-06-30 — End: 2015-07-06

## 2015-06-30 MED ORDER — GUAIFENESIN ER 600 MG PO TB12
600.0000 mg | ORAL_TABLET | Freq: Two times a day (BID) | ORAL | Status: DC
  Administered 2015-06-30 – 2015-07-06 (×12): 600 mg via ORAL
  Filled 2015-06-30 (×12): qty 1

## 2015-06-30 MED ORDER — LEVOFLOXACIN 500 MG PO TABS
500.0000 mg | ORAL_TABLET | Freq: Every day | ORAL | Status: DC
  Administered 2015-07-01 – 2015-07-06 (×6): 500 mg via ORAL
  Filled 2015-06-30 (×6): qty 1

## 2015-06-30 MED ORDER — ALPRAZOLAM 0.25 MG PO TABS: 1.0000 mg | ORAL_TABLET | Freq: Every evening | ORAL | Status: DC | PRN

## 2015-06-30 MED ORDER — ACETAMINOPHEN 325 MG PO TABS
650.0000 mg | ORAL_TABLET | Freq: Once | ORAL | Status: AC
  Administered 2015-06-30: 650 mg via ORAL
  Filled 2015-06-30: qty 2

## 2015-06-30 MED ORDER — ATORVASTATIN CALCIUM 20 MG PO TABS
20.0000 mg | ORAL_TABLET | Freq: Every day | ORAL | Status: DC
  Administered 2015-06-30 – 2015-07-06 (×7): 20 mg via ORAL
  Filled 2015-06-30 (×4): qty 1
  Filled 2015-06-30: qty 2
  Filled 2015-06-30 (×2): qty 1

## 2015-06-30 MED ORDER — DULOXETINE HCL 60 MG PO CPEP
60.0000 mg | ORAL_CAPSULE | Freq: Every day | ORAL | Status: DC
  Administered 2015-06-30 – 2015-07-06 (×7): 60 mg via ORAL
  Filled 2015-06-30 (×7): qty 1

## 2015-06-30 MED ORDER — ASPIRIN EC 81 MG PO TBEC
81.0000 mg | DELAYED_RELEASE_TABLET | Freq: Every day | ORAL | Status: DC
  Administered 2015-06-30 – 2015-07-06 (×7): 81 mg via ORAL
  Filled 2015-06-30 (×7): qty 1

## 2015-06-30 MED ORDER — ALPRAZOLAM 0.5 MG PO TABS
1.0000 mg | ORAL_TABLET | Freq: Every day | ORAL | Status: DC
  Administered 2015-06-30 – 2015-07-05 (×6): 1 mg via ORAL
  Filled 2015-06-30 (×6): qty 2

## 2015-06-30 MED ORDER — HYDROCODONE-ACETAMINOPHEN 5-325 MG PO TABS
1.0000 | ORAL_TABLET | Freq: Four times a day (QID) | ORAL | Status: DC | PRN
  Administered 2015-07-01 – 2015-07-03 (×3): 1 via ORAL
  Filled 2015-06-30 (×3): qty 1

## 2015-06-30 MED ORDER — IPRATROPIUM-ALBUTEROL 0.5-2.5 (3) MG/3ML IN SOLN: 9.0000 mL | Freq: Once | RESPIRATORY_TRACT | Status: DC

## 2015-06-30 MED ORDER — VANCOMYCIN HCL IN DEXTROSE 1-5 GM/200ML-% IV SOLN: 1000.0000 mg | Freq: Once | INTRAVENOUS | Status: DC

## 2015-06-30 MED ORDER — FLUTICASONE PROPIONATE 50 MCG/ACT NA SUSP
1.0000 | Freq: Two times a day (BID) | NASAL | Status: DC
  Administered 2015-06-30 – 2015-07-06 (×12): 1 via NASAL
  Filled 2015-06-30 (×2): qty 16

## 2015-06-30 MED ORDER — IPRATROPIUM-ALBUTEROL 0.5-2.5 (3) MG/3ML IN SOLN
3.0000 mL | RESPIRATORY_TRACT | Status: DC
  Administered 2015-06-30 – 2015-07-01 (×8): 3 mL via RESPIRATORY_TRACT
  Filled 2015-06-30 (×8): qty 3

## 2015-06-30 MED ORDER — ENOXAPARIN SODIUM 40 MG/0.4ML ~~LOC~~ SOLN
40.0000 mg | Freq: Every day | SUBCUTANEOUS | Status: DC
Start: 2015-06-30 — End: 2015-07-06
  Administered 2015-06-30 – 2015-07-05 (×6): 40 mg via SUBCUTANEOUS
  Filled 2015-06-30 (×6): qty 0.4

## 2015-06-30 MED ORDER — ONDANSETRON HCL 4 MG PO TABS: 4.0000 mg | ORAL_TABLET | Freq: Four times a day (QID) | ORAL | Status: DC | PRN

## 2015-06-30 MED ORDER — SODIUM CHLORIDE 0.9% FLUSH
3.0000 mL | Freq: Two times a day (BID) | INTRAVENOUS | Status: DC
  Administered 2015-07-01 – 2015-07-06 (×11): 3 mL via INTRAVENOUS

## 2015-06-30 MED ORDER — ALBUTEROL (5 MG/ML) CONTINUOUS INHALATION SOLN
20.0000 mg/h | INHALATION_SOLUTION | Freq: Once | RESPIRATORY_TRACT | Status: AC
  Administered 2015-06-30: 20 mg/h via RESPIRATORY_TRACT
  Filled 2015-06-30: qty 20

## 2015-06-30 MED ORDER — INSULIN ASPART 100 UNIT/ML ~~LOC~~ SOLN
0.0000 [IU] | Freq: Three times a day (TID) | SUBCUTANEOUS | Status: DC
  Administered 2015-07-01 – 2015-07-04 (×8): 1 [IU] via SUBCUTANEOUS
  Administered 2015-07-04: 2 [IU] via SUBCUTANEOUS
  Administered 2015-07-05: 3 [IU] via SUBCUTANEOUS

## 2015-06-30 MED ORDER — METHYLPREDNISOLONE SODIUM SUCC 125 MG IJ SOLR: 60.0000 mg | Freq: Four times a day (QID) | INTRAMUSCULAR | Status: DC

## 2015-06-30 MED ORDER — BISACODYL 10 MG RE SUPP: 10.0000 mg | Freq: Every day | RECTAL | Status: DC | PRN

## 2015-06-30 MED ORDER — DEXTROSE 5 % IV SOLN: 1.0000 g | INTRAVENOUS | Status: DC

## 2015-06-30 MED ORDER — ACETAMINOPHEN 325 MG PO TABS
650.0000 mg | ORAL_TABLET | Freq: Four times a day (QID) | ORAL | Status: DC | PRN
  Administered 2015-06-30: 650 mg via ORAL
  Filled 2015-06-30: qty 2

## 2015-06-30 MED ORDER — DEXTROSE 5 % IV SOLN: 2.0000 g | Freq: Once | INTRAVENOUS | Status: DC

## 2015-06-30 MED ORDER — POTASSIUM CHLORIDE CRYS ER 20 MEQ PO TBCR
20.0000 meq | EXTENDED_RELEASE_TABLET | Freq: Every day | ORAL | Status: DC
  Administered 2015-06-30 – 2015-07-06 (×7): 20 meq via ORAL
  Filled 2015-06-30 (×7): qty 1

## 2015-06-30 MED ORDER — TRAMADOL HCL 50 MG PO TABS
50.0000 mg | ORAL_TABLET | Freq: Once | ORAL | Status: AC
  Administered 2015-06-30: 50 mg via ORAL
  Filled 2015-06-30: qty 1

## 2015-06-30 MED ORDER — PREDNISONE 20 MG PO TABS: 40.0000 mg | ORAL_TABLET | Freq: Once | ORAL | Status: DC

## 2015-06-30 MED ORDER — PANTOPRAZOLE SODIUM 40 MG PO TBEC
40.0000 mg | DELAYED_RELEASE_TABLET | Freq: Every morning | ORAL | Status: DC
  Administered 2015-07-01 – 2015-07-06 (×6): 40 mg via ORAL
  Filled 2015-06-30 (×7): qty 1

## 2015-06-30 MED ORDER — METHYLPREDNISOLONE SODIUM SUCC 125 MG IJ SOLR
60.0000 mg | Freq: Four times a day (QID) | INTRAMUSCULAR | Status: DC
  Administered 2015-06-30 – 2015-07-03 (×11): 60 mg via INTRAVENOUS
  Filled 2015-06-30 (×11): qty 2

## 2015-06-30 MED ORDER — LOSARTAN POTASSIUM 50 MG PO TABS
50.0000 mg | ORAL_TABLET | Freq: Every day | ORAL | Status: DC
  Administered 2015-06-30 – 2015-07-06 (×7): 50 mg via ORAL
  Filled 2015-06-30 (×6): qty 1

## 2015-06-30 MED ORDER — ACETAMINOPHEN 650 MG RE SUPP: 650.0000 mg | Freq: Four times a day (QID) | RECTAL | Status: DC | PRN

## 2015-06-30 NOTE — ED Provider Notes (Signed)
CSN: [id number]     Arrival date & time 06/30/15  1351 History   First MD Initiated Contact with Patient 06/30/15 1400     Chief Complaint  Patient presents with  . Shortness of Breath   HPI 70 year old female with past medical history of COPD presents with concerns of shortness of breath, chest pain. Patient states that she's not been feeling well for 2 days, have myalgias. She states she's been doing her albuterol at home as well as her Symbicort was no significant improvement. She called EMS today his symptoms were worsening and they administered Solu-Medrol in route as well as a DuoNeb. Patient states her chest pain is present only with coughing. No hemoptysis. No history of PE or DVT. No leg swelling. She does have a history of CHF but denies any recent weight gain. No fevers, chills but does have significant sputum production. She's had multiple pneumonias in the past and recently was on levofloxacin in May for a COPD exacerbation. Patient states she frequently does not finish the antibiotic course however.  Past Medical History  Diagnosis Date  . Hypertension   . Chronic back pain     "goes from the lower part of my back on down my legs"  (04/17/2014)  . Osteopenia   . PVD (peripheral vascular disease) (HCC)   . Tobacco abuse     " I am addicted"   . Vitamin D deficiency disease     03/26/2010 - 9.9ng/mL. Started on replacemnt  . Alcohol abuse, in remission quit 2004  . Neuromuscular disorder (HCC)     lumbar disc  . Anxiety     Xanax prn  . Shortness of breath     with anxiety  and activty  . Asthma   . Hyperlipidemia   . On home oxygen therapy     "2L; suppose to be 24/7" (04/17/2014)  . COPD (chronic obstructive pulmonary disease) (HCC)     cxr 04/03/2010 - hyperinflation  . Pneumonia     "3 times in the last month or so" (04/17/2014)  . Type II diabetes mellitus (HCC)   . Arthritis     "my whole body"  . Physical deconditioning   . Chronic respiratory failure with  hypercapnia (HCC)   . HCAP (healthcare-associated pneumonia)   . COPD exacerbation (HCC)   . Leukocytosis   . Dysphagia   . Protein calorie malnutrition (HCC)   . Esophageal dysmotility   . Anemia of chronic disease    Past Surgical History  Procedure Laterality Date  . Lumbar laminectomy/decompression microdiscectomy  2005    L4-5/notes 03/06/2010  . Appendectomy  1960  . Tonsillectomy  1960  . Posterior lumbar fusion  2002  . Abdominal hysterectomy      "partial"  . Back surgery    . Carpal tunnel release Bilateral     CTS repair 2000 (R), 2006 (L)/notes 01/01/2014  . Cataract extraction w/ intraocular lens  implant, bilateral Bilateral 2015   Family History  Problem Relation Age of Onset  . Heart attack Sister 1862    CABG  . Breast cancer Sister   . Cancer Brother   . Heart attack Brother   . Hypertension Other     all siblings  . Anesthesia problems Neg Hx    Social History  Substance Use Topics  . Smoking status: Former Smoker -- 0.25 packs/day for 56 years    Types: Cigarettes    Quit date: 01/27/2015  . Smokeless tobacco: Never Used  .  Alcohol Use: 0.0 oz/week    0 Standard drinks or equivalent per week     Comment: h/o heavy ETOH quit 2004   OB History    No data available     Review of Systems  Constitutional: Negative for fever.  Allergic/Immunologic: Negative for immunocompromised state.  All other systems reviewed and are negative.     Allergies  Brovana; Budesonide; and Mucomyst  Home Medications   Prior to Admission medications   Medication Sig Start Date End Date Taking? Authorizing Provider  acetaminophen (TYLENOL) 500 MG tablet Take 500-1,000 mg by mouth every 8 (eight) hours as needed (for pain).   Yes Historical Provider, MD  albuterol (PROVENTIL HFA;VENTOLIN HFA) 108 (90 BASE) MCG/ACT inhaler Inhale 2 puffs into the lungs every 4 (four) hours as needed for wheezing or shortness of breath.    Yes Historical Provider, MD  albuterol  (PROVENTIL) (2.5 MG/3ML) 0.083% nebulizer solution Take 3 mLs (2.5 mg total) by nebulization every 4 (four) hours as needed for wheezing or shortness of breath. (may take 1 every 4 hours as needed for wheezing/shortness of breath) DX: 496 Patient taking differently: Take 2.5 mg by nebulization every 4 (four) hours as needed for wheezing or shortness of breath. DX: 496 01/09/14  Yes [name], MD  alprazolam Prudy Feeler) 2 MG tablet Take 1-2 mg by mouth at bedtime.  06/21/15  Yes Historical Provider, MD  aspirin EC 81 MG EC tablet Take 1 tablet (81 mg total) by mouth daily. 01/09/14  Yes [name], MD  atorvastatin (LIPITOR) 20 MG tablet Take 20 mg by mouth daily. evening 09/14/14  Yes Historical Provider, MD  DULoxetine (CYMBALTA) 60 MG capsule Take 60 mg by mouth daily.   Yes Historical Provider, MD  fluticasone (FLONASE) 50 MCG/ACT nasal spray Place 1 spray into both nostrils 2 (two) times daily. 12/23/14  Yes Sharee Holster, NP  furosemide (LASIX) 40 MG tablet Take 40 mg by mouth every morning.   Yes Historical Provider, MD  Hydrocod Polst-Chlorphen Polst [facility] PENNKINETIC ER PO) Take 5 mLs by mouth 2 (two) times daily as needed (cough).    Yes Historical Provider, MD  HYDROcodone-acetaminophen (NORCO/VICODIN) 5-325 MG tablet Take 1 tablet by mouth every 6 (six) hours as needed for moderate pain.   Yes Historical Provider, MD  ipratropium-albuterol (DUONEB) 0.5-2.5 (3) MG/3ML SOLN INHALE 1 VAIL VIA NEBULIZER FOUR TIMES DAILY 05/15/15  Yes Kalman Shan, MD  losartan (COZAAR) 50 MG tablet Take 50 mg by mouth daily.   Yes Historical Provider, MD  metFORMIN (GLUCOPHAGE) 500 MG tablet TAKE 1 TABLET BY MOUTH TWICE DAILY 01/07/15  Yes Sharee Holster, NP  Multiple Vitamin (MULTIVITAMIN) tablet Take 1 tablet by mouth daily.   Yes Historical Provider, MD  ONE TOUCH ULTRA TEST test strip As directed 04/24/15  Yes Historical Provider, MD  pantoprazole (PROTONIX) 40 MG tablet TAKE 1 TABLET BY MOUTH  EVERY NIGHT Patient taking differently: TAKE 1 TABLET BY MOUTH EVERY MORNING 01/07/15  Yes Sharee Holster, NP  potassium chloride SA (K-DUR,KLOR-CON) 20 MEQ tablet Take 1 tablet (20 mEq total) by mouth daily. Take while on lasix 12/12/14  Yes Jeanella Craze, NP  predniSONE (DELTASONE) 5 MG tablet Take 10 mg by mouth daily with breakfast.    Yes Historical Provider, MD  Protein (PROCEL 100) POWD Take 2 scoop by mouth 2 (two) times daily.    Yes Historical Provider, MD  SYMBICORT 160-4.5 MCG/ACT inhaler Inhale 2 puffs into the lungs 2 (  two) times daily. 05/20/15  Yes Historical Provider, MD   BP 150/72 mmHg  Pulse 119  Resp 25  SpO2 97% Physical Exam  Constitutional: She appears well-developed and well-nourished. No distress.  HENT:  Head: Normocephalic and atraumatic.  Eyes: Conjunctivae are normal. Right eye exhibits no discharge. Left eye exhibits no discharge.  Neck: Normal range of motion. Neck supple. No JVD present.  Cardiovascular: Regular rhythm, normal heart sounds and intact distal pulses.   No murmur heard. tachycardic  Pulmonary/Chest: Effort normal. No respiratory distress. She has wheezes. She has rales. She exhibits tenderness (substernally).  On 9L while on nebulizer Coughing copious green mucous  Abdominal: Soft. Bowel sounds are normal. She exhibits no distension and no mass. There is no tenderness. There is no rebound and no guarding.  Musculoskeletal: She exhibits no edema (of LEs).  Neurological: She is alert.  Skin: Skin is warm. No rash noted.  Psychiatric: She has a normal mood and affect.  Nursing note and vitals reviewed.   ED Course  Procedures (including critical care time) Labs Review Labs Reviewed  CBC WITH DIFFERENTIAL/PLATELET - Abnormal; Notable for the following:    WBC 17.5 (*)    Hemoglobin 9.5 (*)    MCH 23.4 (*)    MCHC 26.3 (*)    Neutro Abs 14.3 (*)    All other components within normal limits  BASIC METABOLIC PANEL - Abnormal; Notable  for the following:    Chloride 96 (*)    CO2 37 (*)    Glucose, Bld 126 (*)    All other components within normal limits  BRAIN NATRIURETIC PEPTIDE - Abnormal; Notable for the following:    B Natriuretic Peptide 133.7 (*)    All other components within normal limits  I-STAT ARTERIAL BLOOD GAS, ED - Abnormal; Notable for the following:    pCO2 arterial 65.1 (*)    pO2, Arterial 21.0 (*)    Bicarbonate 41.2 (*)    Acid-Base Excess 13.0 (*)    All other components within normal limits  CULTURE, BLOOD (ROUTINE X 2)  CULTURE, BLOOD (ROUTINE X 2)  CULTURE, EXPECTORATED SPUTUM-ASSESSMENT  RESPIRATORY PANEL BY PCR    Imaging Review Dg Chest Port 1 View  06/30/2015  CLINICAL DATA:  Shortness of breath EXAM: PORTABLE CHEST 1 VIEW COMPARISON:  04/15/2015 FINDINGS: Left base atelectasis or infiltrate. Small left pole or effusion. Right lung is clear. Heart is normal size. No acute bony abnormality. IMPRESSION: Left base atelectasis or infiltrate with small left effusion. Electronically Signed   By: Charlett Nose M.D.   On: 06/30/2015 15:01   I have personally reviewed and evaluated these images and lab results as part of my medical decision-making.   EKG Interpretation   Date/Time:  Monday June 30 2015 14:08:07 EDT Ventricular Rate:  98 PR Interval:  124 QRS Duration: 69 QT Interval:  378 QTC Calculation: 483 R Axis:   -4 Text Interpretation:  Sinus rhythm No significant change since last  tracing Confirmed by STEINL  MD, Caryn Bee ([id number]) on 06/30/2015 2:19:06 PM      MDM   Final diagnoses:  Healthcare-associated pneumonia  COPD exacerbation (HCC)   COPD exacerbation secondary to viral versus bacterial source. RVP ordered and pending. Blood cultures, sputum culture ordered. Given recent antibiotic course, will treat as healthcare associated pneumonia given a chest x-ray with question will infiltrate on the left. Patient hemodynamically stable but tachycardic, suspect secondary to  albuterol. Steroids administered in route. Patient wears 2 L of  oxygen and home and does have a new oxygen requirement at 3 L. Her CO2 appears to be chronically elevated there are no mental status changes here. She does have chronic thigh pain for which Ultram and Tylenol were ordered while in the emergency department. No evidence of DVT or PE per history and exam and patient is was criteria of 1 given the tachycardia. However, in the setting of albuterol treatment, no need to further evaluate as the leave risk is overall low and this can be further monitored as an inpatient. Discussed case with inpatient team. [facility] admit patient.     Sidney Ace, MD 06/30/15 1633  Cathren Laine, MD 07/19/15 1336

## 2015-06-30 NOTE — H&P (Signed)
[facility] History and Physical  Nicole Maxwell:[id number] DOB: [birth year] DOA: 06/30/2015  Referring physician: Franklyn Lor PCP: [name], MD   Chief Complaint: Cough/acute encephalopathy  HPI: Nicole Maxwell is a 70 y.o. female with past medical history that includes chronic respiratory failure on home oxygen related to COPD, CHF, diabetes,  presents to the emergency department from home with the chief complaint of worsening cough with increased sputum production and cough and acute encephalopathy. Initial evaluation reveals acute on chronic respiratory failure likely related to COPD exacerbation  Information is obtained from the daughter who is at the bedside and the patient but information from patient may be unreliable do to acute encephalopathy. Daughter reports 2 day history of gradual worsening cough increased sputum production and development of confusion generalized weakness. He reports no increased work of breathing she has noticed development of confusion disorientation. Patient denies any chest pain palpitation worsening shortness of breath. She denies any headache visual disturbances dizziness syncope or near-syncope. She denies abdominal pain nausea vomiting but does report a decreased oral intake. She denies dysuria hematuria frequency or urgency. She denies lower extremity edema. She does report being on Levaquin last month for a COPD exacerbation  Emergency department she's afebrile hemodynamically stable somewhat tachycardic with increased oxygen demand. Oxygen saturation level 92% on 4 L.  He is provided with 120 mg of Solu-Medrol and route she is given nebulizer in the emergency department  Review of Systems:  10 point review of systems complete and all systems are negative except as indicated in the history of present illness  Past Medical History  Diagnosis Date  . Hypertension   . Chronic back pain     "goes from the lower part of my back on down my legs"   (04/17/2014)  . Osteopenia   . PVD (peripheral vascular disease) (HCC)   . Tobacco abuse     " I am addicted"   . Vitamin D deficiency disease     03/26/2010 - 9.9ng/mL. Started on replacemnt  . Alcohol abuse, in remission quit 2004  . Neuromuscular disorder (HCC)     lumbar disc  . Anxiety     Xanax prn  . Shortness of breath     with anxiety  and activty  . Asthma   . Hyperlipidemia   . On home oxygen therapy     "2L; suppose to be 24/7" (04/17/2014)  . COPD (chronic obstructive pulmonary disease) (HCC)     cxr 04/03/2010 - hyperinflation  . Pneumonia     "3 times in the last month or so" (04/17/2014)  . Type II diabetes mellitus (HCC)   . Arthritis     "my whole body"  . Physical deconditioning   . Chronic respiratory failure with hypercapnia (HCC)   . HCAP (healthcare-associated pneumonia)   . COPD exacerbation (HCC)   . Leukocytosis   . Dysphagia   . Protein calorie malnutrition (HCC)   . Esophageal dysmotility   . Anemia of chronic disease    Past Surgical History  Procedure Laterality Date  . Lumbar laminectomy/decompression microdiscectomy  2005    L4-5/notes 03/06/2010  . Appendectomy  1960  . Tonsillectomy  1960  . Posterior lumbar fusion  2002  . Abdominal hysterectomy      "partial"  . Back surgery    . Carpal tunnel release Bilateral     CTS repair 2000 (R), 2006 (L)/notes 01/01/2014  . Cataract extraction w/ intraocular lens  implant, bilateral Bilateral 2015  Social History:  reports that she quit smoking about 5 months ago. Her smoking use included Cigarettes. She has a 14 pack-year smoking history. She has never used smokeless tobacco. She reports that she drinks alcohol. She reports that she does not use illicit drugs.  Allergies  Allergen Reactions  . Brovana [Arformoterol Tartrate] Shortness Of Breath and Cough    General intolerance  . Budesonide Shortness Of Breath and Cough    General intolerance  . Mucomyst [Acetylcysteine] Shortness Of Breath      Family History  Problem Relation Age of Onset  . Heart attack Sister 2462    CABG  . Breast cancer Sister   . Cancer Brother   . Heart attack Brother   . Hypertension Other     all siblings  . Anesthesia problems Neg Hx      Prior to Admission medications   Medication Sig Start Date End Date Taking? Authorizing Provider  acetaminophen (TYLENOL) 500 MG tablet Take 500-1,000 mg by mouth every 8 (eight) hours as needed (for pain).   Yes Historical Provider, MD  albuterol (PROVENTIL HFA;VENTOLIN HFA) 108 (90 BASE) MCG/ACT inhaler Inhale 2 puffs into the lungs every 4 (four) hours as needed for wheezing or shortness of breath.    Yes Historical Provider, MD  albuterol (PROVENTIL) (2.5 MG/3ML) 0.083% nebulizer solution Take 3 mLs (2.5 mg total) by nebulization every 4 (four) hours as needed for wheezing or shortness of breath. (may take 1 every 4 hours as needed for wheezing/shortness of breath) DX: 496 Patient taking differently: Take 2.5 mg by nebulization every 4 (four) hours as needed for wheezing or shortness of breath. DX: 496 01/09/14  Yes Martha ClanWilliam Shaw, MD  alprazolam Prudy Feeler(XANAX) 2 MG tablet Take 1-2 mg by mouth at bedtime.  06/21/15  Yes Historical Provider, MD  aspirin EC 81 MG EC tablet Take 1 tablet (81 mg total) by mouth daily. 01/09/14  Yes Martha ClanWilliam Shaw, MD  atorvastatin (LIPITOR) 20 MG tablet Take 20 mg by mouth daily. evening 09/14/14  Yes Historical Provider, MD  DULoxetine (CYMBALTA) 60 MG capsule Take 60 mg by mouth daily.   Yes Historical Provider, MD  fluticasone (FLONASE) 50 MCG/ACT nasal spray Place 1 spray into both nostrils 2 (two) times daily. 12/23/14  Yes Sharee Holstereborah S Green, NP  furosemide (LASIX) 40 MG tablet Take 40 mg by mouth every morning.   Yes Historical Provider, MD  Hydrocod Polst-Chlorphen Polst [facility](TUSSIONEX PENNKINETIC ER PO) Take 5 mLs by mouth 2 (two) times daily as needed (cough).    Yes Historical Provider, MD  HYDROcodone-acetaminophen (NORCO/VICODIN) 5-325 MG  tablet Take 1 tablet by mouth every 6 (six) hours as needed for moderate pain.   Yes Historical Provider, MD  ipratropium-albuterol (DUONEB) 0.5-2.5 (3) MG/3ML SOLN INHALE 1 VAIL VIA NEBULIZER FOUR TIMES DAILY 05/15/15  Yes Kalman ShanMurali Ramaswamy, MD  losartan (COZAAR) 50 MG tablet Take 50 mg by mouth daily.   Yes Historical Provider, MD  metFORMIN (GLUCOPHAGE) 500 MG tablet TAKE 1 TABLET BY MOUTH TWICE DAILY 01/07/15  Yes Sharee Holstereborah S Green, NP  Multiple Vitamin (MULTIVITAMIN) tablet Take 1 tablet by mouth daily.   Yes Historical Provider, MD  ONE TOUCH ULTRA TEST test strip As directed 04/24/15  Yes Historical Provider, MD  pantoprazole (PROTONIX) 40 MG tablet TAKE 1 TABLET BY MOUTH EVERY NIGHT Patient taking differently: TAKE 1 TABLET BY MOUTH EVERY MORNING 01/07/15  Yes Sharee Holstereborah S Green, NP  potassium chloride SA (K-DUR,KLOR-CON) 20 MEQ tablet Take 1 tablet (20  mEq total) by mouth daily. Take while on lasix 12/12/14  Yes Jeanella Craze, NP  predniSONE (DELTASONE) 5 MG tablet Take 10 mg by mouth daily with breakfast.    Yes Historical Provider, MD  Protein (PROCEL 100) POWD Take 2 scoop by mouth 2 (two) times daily.    Yes Historical Provider, MD  SYMBICORT 160-4.5 MCG/ACT inhaler Inhale 2 puffs into the lungs 2 (two) times daily. 05/20/15  Yes Historical Provider, MD   Physical Exam: Filed Vitals:   06/30/15 1545 06/30/15 1616 06/30/15 1630 06/30/15 1645  BP: 136/77 150/72 140/56 153/65  Pulse: 111 119 122 120  Resp: 25 25 26 21   SpO2: 95% 97% 95% 94%    Wt Readings from Last 3 Encounters:  04/28/15 55.792 kg (123 lb)  04/11/15 50.349 kg (111 lb)  04/08/15 55.792 kg (123 lb)    General:  Appears somewhat anxious but not uncomfortable Eyes: PERRL, normal lids, irises & conjunctiva ENT: grossly normal hearing, lips & tongue Neck: no LAD, masses or thyromegaly Cardiovascular: Tachycardic but regular, no m/r/g. No LE edema. Pedal pulses present and palpable  Respiratory: Mild-to-moderate  increased work of breathing with conversation. Able to complete very short sentences breath sounds with scattered rhonchi scattered end expiratory wheezing faint crackles Abdomen: soft, ntnd is a bowel sounds no guarding or rebounding Skin: no rash or induration seen on limited exam Musculoskeletal: grossly normal tone BUE/BLE joints without swelling/erythema Psychiatric: grossly normal mood and affect, speech fluent and appropriate Neurologic: grossly non-focal. Speech is slow but clear oriented to self and place only. Responds to questions and commands but slowly           Labs on Admission:  Basic Metabolic Panel:  Recent Labs Lab 06/30/15 1450  NA 143  K 4.2  CL 96*  CO2 37*  GLUCOSE 126*  BUN 6  CREATININE 0.67  CALCIUM 9.6   Liver Function Tests: No results for input(s): AST, ALT, ALKPHOS, BILITOT, PROT, ALBUMIN in the last 168 hours. No results for input(s): LIPASE, AMYLASE in the last 168 hours. No results for input(s): AMMONIA in the last 168 hours. CBC:  Recent Labs Lab 06/30/15 1450  WBC 17.5*  NEUTROABS 14.3*  HGB 9.5*  HCT 36.1  MCV 88.9  PLT 285   Cardiac Enzymes: No results for input(s): CKTOTAL, CKMB, CKMBINDEX, TROPONINI in the last 168 hours.  BNP (last 3 results)  Recent Labs  11/27/14 1458 03/15/15 1430 06/30/15 1450  BNP 87.7 45.7 133.7*    ProBNP (last 3 results) No results for input(s): PROBNP in the last 8760 hours.  CBG: No results for input(s): GLUCAP in the last 168 hours.  Radiological Exams on Admission: Dg Chest Port 1 View  06/30/2015  CLINICAL DATA:  Shortness of breath EXAM: PORTABLE CHEST 1 VIEW COMPARISON:  04/15/2015 FINDINGS: Left base atelectasis or infiltrate. Small left pole or effusion. Right lung is clear. Heart is normal size. No acute bony abnormality. IMPRESSION: Left base atelectasis or infiltrate with small left effusion. Electronically Signed   By: Charlett Nose M.D.   On: 06/30/2015 15:01    EKG:  Independently reviewed. Sinus rhythm No significant change since last tracing  Assessment/Plan Principal Problem:   Acute on chronic respiratory failure (HCC) Active Problems:   Severe chronic obstructive pulmonary disease (HCC)   HTN (hypertension)   Hyperlipidemia   COPD exacerbation (HCC)   Diabetes mellitus type 2, noninsulin dependent (HCC)   Chronic diastolic congestive heart failure (HCC)   Leukocytosis  Anemia   CAP (community acquired pneumonia)   Acute encephalopathy  #1. Acute on chronic respiratory failure likely related to COPD exacerbation. She is on oxygen 20 4/7 CPAP at night but is noncompliant with this. Chest x-ray left base atelectasis or infiltrate with small left effusion. Oxygen saturation level 92% on 6 L nasal cannula. Respiratory rate 29. ABG with a pH of 7.40, PCO2 65.1, PO2 21.0 BNP 133. She was provided with nebulizer, Solu-Medrol vancomycin and ceftin emergency department. -Admit to telemetry -Continue scheduled nebs -Continue Solu-Medrol -Continue oxygen supplementation -change antibiotics to Levaquin -Flutter valve -Mucinous  #2. COPD exacerbation. Patient wears oxygen 24 7 at home. ABG as noted above. PCO2 of 65 appears close to baseline but O2 of 21 much lower than baseline. Chart review indicates pulmonology visit in April of this year and progress note indicates Recent hospitalization for COPD flare hypercarbic respiratory failure positive rhinovirus and left lower lobe pneumonia -Continue oxygen supplementation -Steroids as noted above -Nebulizers as noted above -Levaquin -Flutter valve  #3. Query community-acquired pneumonia. Chest x-ray atelectasis with small effusion and possible infiltrate. WBC 17.5 the patient is on 10 mg of prednisone daily. His afebrile nontoxic appearing hemodynamically stable she is ordered vancomycin and Maxipime in the emergency department. -Discontinue vancomycin and Maxipime -Provide Levaquin -Monitor  closely -Follow blood cultures -Stained sputum culture as able  4. Diabetes non-insulin-dependent. Likely related to chronic use of steroids. Serum glucose on admission 122 -We'll monitor -Carb modified diet -Hold metformin -Sliding scale insulin  5. Chronic diastolic heart failure. Compensated. BNP 133. Echo done February 2017 shows EF of 55% rate 1 diastolic dysfunction mild LVH. Home medications include lasix, losartan -Continue home meds -Intake and output -Obtain daily weights  #6. Hypertension. Fair control in the emergency department. Home medications as noted above -We'll continue those -Monitor closely  #7. Acute encephalopathy. Likely related to #1. PO2 21 and PCO2 65 which is mild increase in  baseline. -repeat ABG -If no improvement consider CT - obtain B 12 and folate rpr  #8. Tobacco use. Patient stopped smoking 1 month ago according to chart. Cessation support offered     Code Status: DNR DVT Prophylaxis: Family Communication: daughter at bedside Disposition Plan: home when ready    [facility]

## 2015-06-30 NOTE — ED Notes (Signed)
Attempted report x1. 

## 2015-06-30 NOTE — ED Notes (Signed)
Pt. BIB [facility] for evaluation of sob worsening today. Pt. Reports not feeling well x 2 days. Family feels that pt. Is confused and has had increased work of breathing worsening today. Pt. Is AxO x4. Pt. Received 10 mg albuterol, 1 mg atrovent and 125 mg solumedrol PTA.

## 2015-06-30 NOTE — Progress Notes (Signed)
Pharmacy Antibiotic Note  Nicole Maxwell is a 70 y.o. female admitted on 06/30/2015 with pneumonia.  Pharmacy has been consulted for cefepime dosing. Pt is afebrile but WBC is elevated at 17.5. Scr is WNL.   Plan: - Cefepime 2gm IV x 1 then 1gm IV Q24H - F/u renal fxn, C&S, clinical status      No data recorded.   Recent Labs Lab 06/30/15 1450  WBC 17.5*  CREATININE 0.67    CrCl cannot be calculated (Unknown ideal weight.).    Allergies  Allergen Reactions  . Brovana [Arformoterol Tartrate] Shortness Of Breath and Cough    General intolerance  . Budesonide Shortness Of Breath and Cough    General intolerance  . Mucomyst [Acetylcysteine] Shortness Of Breath    Antimicrobials this admission: Cefepime 6/5>> Vanc x 1 6/5  Dose adjustments this admission: N/A  Microbiology results: Pending  Thank you for allowing pharmacy to be a part of this patient's care.  Nicole Maxwell, Nicole Maxwell 06/30/2015 4:17 PM

## 2015-06-30 NOTE — Care Management Obs Status (Signed)
MEDICARE OBSERVATION STATUS NOTIFICATION   Patient Details  Name: Nicole Maxwell MRN: [id number] Date of Birth: 26-Dec-1945   Medicare Observation Status Notification Given:  Waylan BogaYes    Guillermina Shaft, RN 06/30/2015, 6:40 PM

## 2015-07-01 DIAGNOSIS — T380X5A Adverse effect of glucocorticoids and synthetic analogues, initial encounter: Secondary | ICD-10-CM | POA: Diagnosis present

## 2015-07-01 DIAGNOSIS — Z9119 Patient's noncompliance with other medical treatment and regimen: Secondary | ICD-10-CM | POA: Diagnosis not present

## 2015-07-01 DIAGNOSIS — R0600 Dyspnea, unspecified: Secondary | ICD-10-CM | POA: Diagnosis not present

## 2015-07-01 DIAGNOSIS — Z7951 Long term (current) use of inhaled steroids: Secondary | ICD-10-CM | POA: Diagnosis not present

## 2015-07-01 DIAGNOSIS — J440 Chronic obstructive pulmonary disease with acute lower respiratory infection: Secondary | ICD-10-CM | POA: Diagnosis present

## 2015-07-01 DIAGNOSIS — J189 Pneumonia, unspecified organism: Secondary | ICD-10-CM | POA: Diagnosis not present

## 2015-07-01 DIAGNOSIS — Z7952 Long term (current) use of systemic steroids: Secondary | ICD-10-CM | POA: Diagnosis not present

## 2015-07-01 DIAGNOSIS — I739 Peripheral vascular disease, unspecified: Secondary | ICD-10-CM | POA: Diagnosis present

## 2015-07-01 DIAGNOSIS — I110 Hypertensive heart disease with heart failure: Secondary | ICD-10-CM | POA: Diagnosis present

## 2015-07-01 DIAGNOSIS — Z8249 Family history of ischemic heart disease and other diseases of the circulatory system: Secondary | ICD-10-CM | POA: Diagnosis not present

## 2015-07-01 DIAGNOSIS — F1021 Alcohol dependence, in remission: Secondary | ICD-10-CM | POA: Diagnosis present

## 2015-07-01 DIAGNOSIS — K224 Dyskinesia of esophagus: Secondary | ICD-10-CM | POA: Diagnosis present

## 2015-07-01 DIAGNOSIS — Z961 Presence of intraocular lens: Secondary | ICD-10-CM | POA: Diagnosis present

## 2015-07-01 DIAGNOSIS — Z888 Allergy status to other drugs, medicaments and biological substances status: Secondary | ICD-10-CM | POA: Diagnosis not present

## 2015-07-01 DIAGNOSIS — I5032 Chronic diastolic (congestive) heart failure: Secondary | ICD-10-CM | POA: Diagnosis present

## 2015-07-01 DIAGNOSIS — Z981 Arthrodesis status: Secondary | ICD-10-CM | POA: Diagnosis not present

## 2015-07-01 DIAGNOSIS — Z7982 Long term (current) use of aspirin: Secondary | ICD-10-CM | POA: Diagnosis not present

## 2015-07-01 DIAGNOSIS — Y92019 Unspecified place in single-family (private) house as the place of occurrence of the external cause: Secondary | ICD-10-CM | POA: Diagnosis not present

## 2015-07-01 DIAGNOSIS — E0951 Drug or chemical induced diabetes mellitus with diabetic peripheral angiopathy without gangrene: Secondary | ICD-10-CM | POA: Diagnosis present

## 2015-07-01 DIAGNOSIS — Z9981 Dependence on supplemental oxygen: Secondary | ICD-10-CM | POA: Diagnosis not present

## 2015-07-01 DIAGNOSIS — J9622 Acute and chronic respiratory failure with hypercapnia: Secondary | ICD-10-CM | POA: Diagnosis not present

## 2015-07-01 DIAGNOSIS — M549 Dorsalgia, unspecified: Secondary | ICD-10-CM | POA: Diagnosis present

## 2015-07-01 DIAGNOSIS — E119 Type 2 diabetes mellitus without complications: Secondary | ICD-10-CM | POA: Diagnosis not present

## 2015-07-01 DIAGNOSIS — D638 Anemia in other chronic diseases classified elsewhere: Secondary | ICD-10-CM | POA: Diagnosis present

## 2015-07-01 DIAGNOSIS — Z9842 Cataract extraction status, left eye: Secondary | ICD-10-CM | POA: Diagnosis not present

## 2015-07-01 DIAGNOSIS — G9340 Encephalopathy, unspecified: Secondary | ICD-10-CM | POA: Diagnosis not present

## 2015-07-01 DIAGNOSIS — J9621 Acute and chronic respiratory failure with hypoxia: Secondary | ICD-10-CM | POA: Diagnosis not present

## 2015-07-01 DIAGNOSIS — Z9841 Cataract extraction status, right eye: Secondary | ICD-10-CM | POA: Diagnosis not present

## 2015-07-01 DIAGNOSIS — Z7984 Long term (current) use of oral hypoglycemic drugs: Secondary | ICD-10-CM | POA: Diagnosis not present

## 2015-07-01 DIAGNOSIS — E785 Hyperlipidemia, unspecified: Secondary | ICD-10-CM | POA: Diagnosis present

## 2015-07-01 DIAGNOSIS — J441 Chronic obstructive pulmonary disease with (acute) exacerbation: Secondary | ICD-10-CM | POA: Diagnosis present

## 2015-07-01 DIAGNOSIS — G8929 Other chronic pain: Secondary | ICD-10-CM | POA: Diagnosis present

## 2015-07-01 DIAGNOSIS — J9811 Atelectasis: Secondary | ICD-10-CM | POA: Diagnosis present

## 2015-07-01 DIAGNOSIS — Z803 Family history of malignant neoplasm of breast: Secondary | ICD-10-CM | POA: Diagnosis not present

## 2015-07-01 DIAGNOSIS — F419 Anxiety disorder, unspecified: Secondary | ICD-10-CM | POA: Diagnosis present

## 2015-07-01 DIAGNOSIS — R0602 Shortness of breath: Secondary | ICD-10-CM | POA: Diagnosis present

## 2015-07-01 DIAGNOSIS — Z87891 Personal history of nicotine dependence: Secondary | ICD-10-CM | POA: Diagnosis not present

## 2015-07-01 LAB — BLOOD GAS, ARTERIAL
Acid-Base Excess: 12.2 mmol/L — ABNORMAL HIGH (ref 0.0–2.0)
BICARBONATE: 37 meq/L — AB (ref 20.0–24.0)
DRAWN BY: 27022
O2 Content: 2 L/min
O2 Saturation: 92.1 %
PCO2 ART: 54.3 mmHg — AB (ref 35.0–45.0)
PH ART: 7.447 (ref 7.350–7.450)
Patient temperature: 98.3
TCO2: 38.7 mmol/L (ref 0–100)
pO2, Arterial: 62.9 mmHg — ABNORMAL LOW (ref 80.0–100.0)

## 2015-07-01 LAB — BASIC METABOLIC PANEL
ANION GAP: 11 (ref 5–15)
BUN: 14 mg/dL (ref 6–20)
CALCIUM: 9.5 mg/dL (ref 8.9–10.3)
CO2: 34 mmol/L — ABNORMAL HIGH (ref 22–32)
Chloride: 93 mmol/L — ABNORMAL LOW (ref 101–111)
Creatinine, Ser: 0.71 mg/dL (ref 0.44–1.00)
GLUCOSE: 150 mg/dL — AB (ref 65–99)
POTASSIUM: 4.7 mmol/L (ref 3.5–5.1)
Sodium: 138 mmol/L (ref 135–145)

## 2015-07-01 LAB — BLOOD CULTURE ID PANEL (REFLEXED)
ACINETOBACTER BAUMANNII: NOT DETECTED
CARBAPENEM RESISTANCE: NOT DETECTED
Candida albicans: NOT DETECTED
Candida glabrata: NOT DETECTED
Candida krusei: NOT DETECTED
Candida parapsilosis: NOT DETECTED
Candida tropicalis: NOT DETECTED
ENTEROBACTERIACEAE SPECIES: NOT DETECTED
ESCHERICHIA COLI: NOT DETECTED
Enterobacter cloacae complex: NOT DETECTED
Enterococcus species: NOT DETECTED
HAEMOPHILUS INFLUENZAE: NOT DETECTED
Klebsiella oxytoca: NOT DETECTED
Klebsiella pneumoniae: NOT DETECTED
LISTERIA MONOCYTOGENES: NOT DETECTED
METHICILLIN RESISTANCE: DETECTED — AB
NEISSERIA MENINGITIDIS: NOT DETECTED
Proteus species: NOT DETECTED
Pseudomonas aeruginosa: NOT DETECTED
SERRATIA MARCESCENS: NOT DETECTED
STAPHYLOCOCCUS SPECIES: DETECTED — AB
STREPTOCOCCUS PYOGENES: NOT DETECTED
STREPTOCOCCUS SPECIES: NOT DETECTED
Staphylococcus aureus (BCID): NOT DETECTED
Streptococcus agalactiae: NOT DETECTED
Streptococcus pneumoniae: NOT DETECTED
Vancomycin resistance: NOT DETECTED

## 2015-07-01 LAB — CBC
HEMATOCRIT: 31.4 % — AB (ref 36.0–46.0)
HEMOGLOBIN: 8.7 g/dL — AB (ref 12.0–15.0)
MCH: 23.5 pg — ABNORMAL LOW (ref 26.0–34.0)
MCHC: 27.7 g/dL — AB (ref 30.0–36.0)
MCV: 84.9 fL (ref 78.0–100.0)
Platelets: 314 10*3/uL (ref 150–400)
RBC: 3.7 MIL/uL — AB (ref 3.87–5.11)
RDW: 15.6 % — ABNORMAL HIGH (ref 11.5–15.5)
WBC: 13.5 10*3/uL — AB (ref 4.0–10.5)

## 2015-07-01 LAB — GLUCOSE, CAPILLARY
GLUCOSE-CAPILLARY: 146 mg/dL — AB (ref 65–99)
GLUCOSE-CAPILLARY: 152 mg/dL — AB (ref 65–99)
Glucose-Capillary: 135 mg/dL — ABNORMAL HIGH (ref 65–99)
Glucose-Capillary: 162 mg/dL — ABNORMAL HIGH (ref 65–99)

## 2015-07-01 LAB — RPR: RPR: NONREACTIVE

## 2015-07-01 MED ORDER — VANCOMYCIN HCL 500 MG IV SOLR
500.0000 mg | Freq: Two times a day (BID) | INTRAVENOUS | Status: DC
  Administered 2015-07-01 – 2015-07-03 (×4): 500 mg via INTRAVENOUS
  Filled 2015-07-01 (×6): qty 500

## 2015-07-01 NOTE — Progress Notes (Signed)
Occupational Therapy Evaluation Patient Details Name: Nicole Maxwell MRN: [id number] DOB: 1945-06-28 Today's Date: 07/01/2015    History of Present Illness 70 y.o. female admitted for increased SOB, myalgias, and confusion. PMH significant for HTN, HLD, COPD on 2L O2 at home, SOB, PVD, chronic back pain, osteopenia, DM type II, arthritis, and anemia.   Clinical Impression   Unsure of pt's PLOF and home information (obtained from previous hospital admission) as pt is disoriented x3 and unable to provide accurate answers. Pt is restless and with decreased safety awareness requiring close min guard assist for O2 line management and safety. Pt will benefit from continued acute OT to increase independence and safety with ADLs and mobility. Feel pt could d/c home if her cognition improves and she has 24/7 assistance initially. Will need to confirm with family if they are able to provide this level of assistance.     Follow Up Recommendations  Home health OT;Supervision/Assistance - 24 hour    Equipment Recommendations  Other (comment) (TBD once home information is confirmed)    Recommendations for Other Services PT consult     Precautions / Restrictions Precautions Precautions: Fall Restrictions Weight Bearing Restrictions: No      Mobility Bed Mobility Overal bed mobility: Needs Assistance Bed Mobility: Supine to Sit     Supine to sit: Supervision     General bed mobility comments: Supervision for safety as pt is confused and restless. No physical assist required.  Transfers Overall transfer level: Needs assistance Equipment used: None Transfers: Sit to/from Stand Sit to Stand: Min guard         General transfer comment: Close min guard assist as pt is confused and restless. Pt repeatedly attempted to get up to find her "purple shoes" during session and required assist for O2 line management.    Balance Overall balance assessment: Needs assistance Sitting-balance  support: No upper extremity supported;Feet supported Sitting balance-Leahy Scale: Good     Standing balance support: No upper extremity supported;During functional activity Standing balance-Leahy Scale: Good Standing balance comment: No LOB of noted when standing or ambulating, but pt is confused and does not manage O2 lines well at this time.                            ADL Overall ADL's : Needs assistance/impaired     Grooming: Wash/dry hands;Min guard;Cueing for safety;Cueing for sequencing;Standing   Upper Body Bathing: Set up;Sitting   Lower Body Bathing: Set up;Sit to/from stand   Upper Body Dressing : Set up;Sitting   Lower Body Dressing: Set up;Sit to/from stand   Toilet Transfer: Min guard;Ambulation;Regular Toilet;Cueing for safety   Toileting- Clothing Manipulation and Hygiene: Min guard;Sit to/from stand       Functional mobility during ADLs: Min guard;Cueing for safety General ADL Comments: Pt with confusion and decreased safety awareness and required close min guard assist for O2 line management as she impulsively wandered around room on OT arrival and during session despite cues.     Vision Vision Assessment?: No apparent visual deficits Additional Comments: Difficult to assess due to pt's confusion   Perception     Praxis      Pertinent Vitals/Pain Pain Assessment: No/denies pain     Hand Dominance Right   Extremity/Trunk Assessment Upper Extremity Assessment Upper Extremity Assessment: Generalized weakness   Lower Extremity Assessment Lower Extremity Assessment: Generalized weakness   Cervical / Trunk Assessment Cervical / Trunk Assessment: Normal  Communication Communication Communication: No difficulties   Cognition Arousal/Alertness: Awake/alert Behavior During Therapy: Restless Overall Cognitive Status: Impaired/Different from baseline Area of Impairment: Attention;Orientation;Memory;Safety/judgement;Awareness;Problem  solving Orientation Level: Disoriented to;Place;Time;Situation Current Attention Level: Sustained Memory: Decreased short-term memory   Safety/Judgement: Decreased awareness of safety     General Comments: Pt disoriented x3 and repeatedly asked and attempted to find her "purple shoes." Pt mumbling unintelligibly at times and difficult to redirect.    General Comments       Exercises       Shoulder Instructions      Home Living Family/patient expects to be discharged to:: Private residence Living Arrangements: Alone Available Help at Discharge: Family;Available PRN/intermittently Type of Home: House Home Access: Level entry     Home Layout: One level     Bathroom Shower/Tub: Chief Strategy Officer: Standard     Home Equipment: Environmental consultant - 2 wheels;Shower seat;Cane - single point   Additional Comments: Information gathered from previous hospital admission - pt with increased confusion and unable to provide accurate answers      Prior Functioning/Environment Level of Independence: Independent with assistive device(s)        Comments: Information gathered from previous hospital admission - pt with increased confusion and unable to provide accurate answers    OT Diagnosis: Generalized weakness;Cognitive deficits   OT Problem List: Decreased strength;Decreased activity tolerance;Impaired balance (sitting and/or standing);Decreased cognition;Decreased safety awareness;Decreased knowledge of use of DME or AE;Cardiopulmonary status limiting activity   OT Treatment/Interventions: Self-care/ADL training;Therapeutic exercise;Energy conservation;DME and/or AE instruction;Therapeutic activities;Patient/family education;Balance training    OT Goals(Current goals can be found in the care plan section) Acute Rehab OT Goals Patient Stated Goal: "to find my purple shoes" OT Goal Formulation: With patient Time For Goal Achievement: 07/15/15 Potential to Achieve Goals:  Good ADL Goals Pt Will Perform Upper Body Bathing: with modified independence;sitting Pt Will Perform Lower Body Bathing: with modified independence;sit to/from stand Pt Will Transfer to Toilet: with modified independence;regular height toilet;ambulating Pt Will Perform Toileting - Clothing Manipulation and hygiene: with modified independence;sitting/lateral leans;sit to/from stand Pt Will Perform Tub/Shower Transfer: Tub transfer;with modified independence;ambulating;shower seat  OT Frequency: Min 2X/week   Barriers to D/C: Decreased caregiver support  Unsure if pt has family members to assist upon discharge       Co-evaluation              End of Session Equipment Utilized During Treatment: Gait belt;Oxygen Nurse Communication: Mobility status  Activity Tolerance: Other (comment);Patient tolerated treatment well Patient left: in chair;with call bell/phone within reach;with chair alarm set   Time: [phone] OT Time Calculation (min): 18 min Charges:  OT General Charges $OT Visit: 1 Procedure OT Evaluation $OT Eval Moderate Complexity: 1 Procedure G-Codes: OT G-codes **NOT FOR INPATIENT CLASS** Functional Assessment Tool Used: clinical judgement Functional Limitation: Self care Self Care Current Status (V5643): At least 1 percent but less than 20 percent impaired, limited or restricted Self Care Goal Status (P2951): At least 1 percent but less than 20 percent impaired, limited or restricted  Nils Pyle, OTR/L Pager: [phone] 07/01/2015, 9:04 AM

## 2015-07-01 NOTE — Evaluation (Signed)
Physical Therapy Evaluation Patient Details Name: Nicole Maxwell MRN: [id number] DOB: [birth year] Today's Date: 07/01/2015   History of Present Illness  70 y.o. female admitted for increased SOB, myalgias, and confusion. PMH significant for HTN, HLD, COPD on 2L O2 at home, SOB, PVD, chronic back pain, osteopenia, DM type II, arthritis, and anemia.  Clinical Impression  Patient presents with confusion, generalized weakness, dyspnea on exertion, impaired balance and impaired endurance impacting mobility. Tolerated gait training with RW and Min A for balance/safety. Pt bumping into walls, doors and items in hallway. Sp02 remained 92% on 3L/min 02 during mobility. Fatigues quickly. Pt will need supervision for OOB and mobility if discharge is home due to safety concerns and balance deficits. If pt does not have 24/7 S, may need ST SNF. Will follow acutely.     Follow Up Recommendations Home health PT;Supervision/Assistance - 24 hour    Equipment Recommendations  None recommended by PT    Recommendations for Other Services OT consult     Precautions / Restrictions Precautions Precautions: Fall Restrictions Weight Bearing Restrictions: No      Mobility  Bed Mobility               General bed mobility comments: Up in chair upon PT arrival.   Transfers Overall transfer level: Needs assistance Equipment used: Rolling walker (2 wheeled) Transfers: Sit to/from Stand Sit to Stand: Min guard         General transfer comment: Min guard for safety. Stood from Orthoptistchair x1.   Ambulation/Gait Ambulation/Gait assistance: Min assist Ambulation Distance (Feet): 100 Feet Assistive device: Rolling walker (2 wheeled) Gait Pattern/deviations: Step-through pattern;Decreased stride length;Narrow base of support Gait velocity: decreased Gait velocity interpretation: <1.8 ft/sec, indicative of risk for recurrent falls General Gait Details: Slow, unsteady gait with difficulty navigating RW bumping  into items in hallway. 3/4 DOE. Sp02 92% on 3L/min 02.   Stairs            Wheelchair Mobility    Modified Rankin (Stroke Patients Only)       Balance Overall balance assessment: Needs assistance Sitting-balance support: Feet supported;No upper extremity supported Sitting balance-Leahy Scale: Good     Standing balance support: During functional activity Standing balance-Leahy Scale: Fair                               Pertinent Vitals/Pain Pain Assessment: No/denies pain    Home Living Family/patient expects to be discharged to:: Private residence Living Arrangements: Alone Available Help at Discharge: Family;Available PRN/intermittently Type of Home: House Home Access: Level entry     Home Layout: One level Home Equipment: Walker - 2 wheels;Shower seat;Cane - single point Additional Comments: Information gathered from previous hospital admission - pt with increased confusion and unable to provide accurate answers    Prior Function Level of Independence: Independent with assistive device(s)         Comments: Information gathered from previous hospital admission - pt with increased confusion and unable to provide accurate answers     Hand Dominance   Dominant Hand: Right    Extremity/Trunk Assessment   Upper Extremity Assessment: Defer to OT evaluation           Lower Extremity Assessment: Generalized weakness      Cervical / Trunk Assessment: Normal  Communication   Communication: No difficulties  Cognition Arousal/Alertness: Awake/alert Behavior During Therapy: WFL for tasks assessed/performed Overall Cognitive Status: Impaired/Different from baseline  Area of Impairment: Orientation;Attention;Safety/judgement Orientation Level: Disoriented to;Place;Time;Situation Current Attention Level: Sustained Memory: Decreased short-term memory   Safety/Judgement: Decreased awareness of safety          General Comments       Exercises        Assessment/Plan    PT Assessment Patient needs continued PT services  PT Diagnosis Difficulty walking   PT Problem List Decreased strength;Cardiopulmonary status limiting activity;Decreased cognition;Decreased activity tolerance;Decreased balance;Decreased mobility;Decreased safety awareness  PT Treatment Interventions Balance training;Gait training;Therapeutic exercise;Therapeutic activities;Functional mobility training;Patient/family education;Stair training;DME instruction   PT Goals (Current goals can be found in the Care Plan section) Acute Rehab PT Goals Patient Stated Goal: none stated PT Goal Formulation: With patient Time For Goal Achievement: 07/15/15 Potential to Achieve Goals: Fair    Frequency Min 3X/week   Barriers to discharge   not sure of level of support at home    Co-evaluation               End of Session Equipment Utilized During Treatment: Gait belt Activity Tolerance: Patient limited by fatigue Patient left: in chair;with call bell/phone within reach;with chair alarm set Nurse Communication: Mobility status    Functional Assessment Tool Used: clinical judgment Functional Limitation: Mobility: Walking and moving around Mobility: Walking and Moving Around Current Status [phone]): At least 20 percent but less than 40 percent impaired, limited or restricted Mobility: Walking and Moving Around Goal Status [phone]): At least 1 percent but less than 20 percent impaired, limited or restricted    Time: 1315-1335 PT Time Calculation (min) (ACUTE ONLY): 20 min   Charges:   PT Evaluation $PT Eval Moderate Complexity: 1 Procedure     PT G Codes:   PT G-Codes **NOT FOR INPATIENT CLASS** Functional Assessment Tool Used: clinical judgment Functional Limitation: Mobility: Walking and moving around Mobility: Walking and Moving Around Current Status (H8469): At least 20 percent but less than 40 percent impaired, limited or  restricted Mobility: Walking and Moving Around Goal Status [phone]): At least 1 percent but less than 20 percent impaired, limited or restricted    Santos Hardwick A Ruberta Holck 07/01/2015, 2:17 PM Mylo Red, PT, DPT [phone]

## 2015-07-01 NOTE — Progress Notes (Addendum)
Pharmacy Antibiotic Note  Nicole Maxwell is a 70 y.o. female admitted on 06/30/2015 with bacteremia.  Pharmacy has been consulted for vancomycin dosing.  Plan: Vancomycin 500 IV every 12 hours.  Goal trough 15-20 mcg/mL.  Monitor clinical progress, c/s, renal function, abx plan/LOT VT@SS  as indicated  Height: 5\' 2"  (157.5 cm) Weight: 122 lb 2.2 oz (55.4 kg) IBW/kg (Calculated) : 50.1  Temp (24hrs), Avg:98.9 F (37.2 C), Min:98.3 F (36.8 C), Max:99.4 F (37.4 C)   Recent Labs Lab 06/30/15 1450 07/01/15 0552  WBC 17.5* 13.5*  CREATININE 0.67 0.71    Estimated Creatinine Clearance: 51.8 mL/min (by C-G formula based on Cr of 0.71).    Allergies  Allergen Reactions  . Brovana [Arformoterol Tartrate] Shortness Of Breath and Cough    General intolerance  . Budesonide Shortness Of Breath and Cough    General intolerance  . Mucomyst [Acetylcysteine] Shortness Of Breath    Antimicrobials this admission: 6/5 cefepime >> 6/5 6/5 vanc >> x1 6/6 vanc>>  Dose adjustments this admission: n/a  Microbiology results: 6/5 BCx: 2 out of 2 with GPCs, likely both CoNS, BCID only run on one culture 6/5 Resp panel PCR: negative    Thank you for allowing Nicole Maxwell to participate in this patients care. Signe Coltonya C Marijean Montanye, PharmD Pager: [phone] 07/01/2015 4:04 PM

## 2015-07-01 NOTE — Progress Notes (Addendum)
PHARMACY - PHYSICIAN COMMUNICATION CRITICAL VALUE ALERT - BLOOD CULTURE IDENTIFICATION (BCID)  Results for orders placed or performed during the hospital encounter of 06/30/15  Blood Culture ID Panel (Reflexed) (Collected: 06/30/2015  4:23 PM)  Result Value Ref Range   Enterococcus species NOT DETECTED NOT DETECTED   Vancomycin resistance NOT DETECTED NOT DETECTED   Listeria monocytogenes NOT DETECTED NOT DETECTED   Staphylococcus species DETECTED (A) NOT DETECTED   Staphylococcus aureus NOT DETECTED NOT DETECTED   Methicillin resistance DETECTED (A) NOT DETECTED   Streptococcus species NOT DETECTED NOT DETECTED   Streptococcus agalactiae NOT DETECTED NOT DETECTED   Streptococcus pneumoniae NOT DETECTED NOT DETECTED   Streptococcus pyogenes NOT DETECTED NOT DETECTED   Acinetobacter baumannii NOT DETECTED NOT DETECTED   Enterobacteriaceae species NOT DETECTED NOT DETECTED   Enterobacter cloacae complex NOT DETECTED NOT DETECTED   Escherichia coli NOT DETECTED NOT DETECTED   Klebsiella oxytoca NOT DETECTED NOT DETECTED   Klebsiella pneumoniae NOT DETECTED NOT DETECTED   Proteus species NOT DETECTED NOT DETECTED   Serratia marcescens NOT DETECTED NOT DETECTED   Carbapenem resistance NOT DETECTED NOT DETECTED   Haemophilus influenzae NOT DETECTED NOT DETECTED   Neisseria meningitidis NOT DETECTED NOT DETECTED   Pseudomonas aeruginosa NOT DETECTED NOT DETECTED   Candida albicans NOT DETECTED NOT DETECTED   Candida glabrata NOT DETECTED NOT DETECTED   Candida krusei NOT DETECTED NOT DETECTED   Candida parapsilosis NOT DETECTED NOT DETECTED   Candida tropicalis NOT DETECTED NOT DETECTED   2 out of 2 blood cultures with GPCs, likely both CoNS, BCID run on only one culture, vancomycin has been added.  Name of physician (or Provider) Contacted: Gherge, C  Changes to prescribed antibiotics required: vancomycin added  Thank you for allowing us to participate in this patients care. Signe Coltonya C  Rebeca Valdivia, PharmD Pager: [phone]  07/01/2015  3:55 PM

## 2015-07-01 NOTE — Plan of Care (Signed)
Problem: Safety: Goal: Ability to remain free from injury will improve Outcome: Not Progressing Patient educated on how to use the call bell and how to not get out of bed without assistance. Patient has not successfully used the call bell and continues to get out of bed without assistance. Bed and chair alarm on. Patient transferred closer to nurses station.

## 2015-07-01 NOTE — Progress Notes (Signed)
PROGRESS NOTE  Nicole Maxwell ZOX:[id number] DOB: [birth year] DOA: 06/30/2015 PCP: [name], William, MD     Brief Narrative: 70 y.o. female with a Past Medical History of HTN, PVD, Tobaco abuse, esophageal dysmotility who presents with acute on chronic respiratory failure in the setting of COPD exacerbation.   Assessment & Plan: Principal Problem:   Acute on chronic respiratory failure (HCC) Active Problems:   Severe chronic obstructive pulmonary disease (HCC)   HTN (hypertension)   Hyperlipidemia   COPD exacerbation (HCC)   Diabetes mellitus type 2, noninsulin dependent (HCC)   Chronic diastolic congestive heart failure (HCC)   Leukocytosis   Anemia   CAP (community acquired pneumonia)   Acute encephalopathy   Tobacco use   Acute on chronic hypoxic and hypercarbic respiratory failure - related to COPD exacerbation.  - She is on oxygen at baseline and CPAP at night but is noncompliant with this. - unfortunately did not have CPAP last night, ordered today. Obtain an ABG since she is confused. - Continue scheduled nebs - Continue Solu-Medrol - Continue oxygen supplementation - continue Levaquin  COPD exacerbation  - Recent hospitalization for COPD flare hypercarbic respiratory failure positive rhinovirus and left lower lobe pneumonia - continue steroids, nebulizer, antibiotics  Query community-acquired pneumonia  - Chest x-ray atelectasis with small effusion and possible infiltrate.  - clinically monitor, monitor cultures, continue Levaquin  Diabetes non-insulin-dependent - Likely related to chronic use of steroids. Serum glucose on admission 122 - We'll monitor - Carb modified diet - Hold metformin - Sliding scale insulin  Chronic diastolic heart failure  - Compensated. BNP 133. Echo done February 2017 shows EF of 55% rate 1 diastolic dysfunction mild LVH. Home medications include lasix, losartan - Continue home meds  Hypertension - looks okay this morning, 146/68,  continue current medications  Acute encephalopathy - likely related to hypercarbia, repeat ABG this morning, may need BiPAP  Tobacco use - Patient stopped smoking 1 month ago according to chart. Cessation support offered   DVT prophylaxis: Lovenox Code Status: DNR Family Communication: no family bedside Disposition Plan: home 3-4 days  Consultants:   None   Procedures:   None   Antimicrobials:  Levaquin 6/5 >>   Subjective: - no chest pain, shortness of breath, no abdominal pain, nausea or vomiting.  - confused  Objective: Filed Vitals:   07/01/15 0435 07/01/15 0541 07/01/15 0557 07/01/15 0741  BP:   146/68   Pulse:   99   Temp:  98.3 F (36.8 C)    TempSrc:  Oral    Resp:  18    Height:      Weight: 55.4 kg (122 lb 2.2 oz)     SpO2:  95% 95% 96%    Intake/Output Summary (Last 24 hours) at 07/01/15 0953 Last data filed at 07/01/15 0926  Gross per 24 hour  Intake      3 ml  Output    800 ml  Net   -797 ml   Filed Weights   06/30/15 1903 07/01/15 0435  Weight: 55.4 kg (122 lb 2.2 oz) 55.4 kg (122 lb 2.2 oz)    Examination: Constitutional: NAD, confused Filed Vitals:   07/01/15 0435 07/01/15 0541 07/01/15 0557 07/01/15 0741  BP:   146/68   Pulse:   99   Temp:  98.3 F (36.8 C)    TempSrc:  Oral    Resp:  18    Height:      Weight: 55.4 kg (122 lb 2.2 oz)  SpO2:  95% 95% 96%   Eyes: PERRL, lids and conjunctivae normal ENMT: Mucous membranes are moist Respiratory: slight rhonchi on left, no wheezing, no crackles. Normal respiratory effort. No accessory muscle use.  Cardiovascular: Regular rate and rhythm, no murmurs / rubs / gallops.   Abdomen: no tenderness. Bowel sounds positive. Neurologic: non focal   Data Reviewed: I have personally reviewed following labs and imaging studies  CBC:  Recent Labs Lab 06/30/15 1450 07/01/15 0552  WBC 17.5* 13.5*  NEUTROABS 14.3*  --   HGB 9.5* 8.7*  HCT 36.1 31.4*  MCV 88.9 84.9  PLT 285 314    Basic Metabolic Panel:  Recent Labs Lab 06/30/15 1450 07/01/15 0552  NA 143 138  K 4.2 4.7  CL 96* 93*  CO2 37* 34*  GLUCOSE 126* 150*  BUN 6 14  CREATININE 0.67 0.71  CALCIUM 9.6 9.5   GFR: Estimated Creatinine Clearance: 51.8 mL/min (by C-G formula based on Cr of 0.71). Liver Function Tests: No results for input(s): AST, ALT, ALKPHOS, BILITOT, PROT, ALBUMIN in the last 168 hours. No results for input(s): LIPASE, AMYLASE in the last 168 hours. No results for input(s): AMMONIA in the last 168 hours. Coagulation Profile: No results for input(s): INR, PROTIME in the last 168 hours. Cardiac Enzymes: No results for input(s): CKTOTAL, CKMB, CKMBINDEX, TROPONINI in the last 168 hours. BNP (last 3 results) No results for input(s): PROBNP in the last 8760 hours. HbA1C: No results for input(s): HGBA1C in the last 72 hours. CBG:  Recent Labs Lab 06/30/15 2204 07/01/15 0853  GLUCAP 320* 146*   Lipid Profile: No results for input(s): CHOL, HDL, LDLCALC, TRIG, CHOLHDL, LDLDIRECT in the last 72 hours. Thyroid Function Tests:  Recent Labs  06/30/15 2110  TSH 0.273*   Anemia Panel:  Recent Labs  06/30/15 2110  VITAMINB12 716   Urine analysis:    Component Value Date/Time   COLORURINE YELLOW 03/15/2015 2336   APPEARANCEUR CLEAR 03/15/2015 2336   LABSPEC 1.020 03/15/2015 2336   PHURINE 5.5 03/15/2015 2336   GLUCOSEU NEGATIVE 03/15/2015 2336   HGBUR NEGATIVE 03/15/2015 2336   BILIRUBINUR NEGATIVE 03/15/2015 2336   KETONESUR NEGATIVE 03/15/2015 2336   PROTEINUR 30* 03/15/2015 2336   NITRITE NEGATIVE 03/15/2015 2336   LEUKOCYTESUR NEGATIVE 03/15/2015 2336   Sepsis Labs: Invalid input(s): PROCALCITONIN, LACTICIDVEN  Recent Results (from the past 240 hour(s))  Respiratory Panel by PCR     Status: None   Collection Time: 06/30/15  4:34 PM  Result Value Ref Range Status   Adenovirus NOT DETECTED NOT DETECTED Final   Coronavirus 229E NOT DETECTED NOT DETECTED  Final   Coronavirus HKU1 NOT DETECTED NOT DETECTED Final   Coronavirus NL63 NOT DETECTED NOT DETECTED Final   Coronavirus OC43 NOT DETECTED NOT DETECTED Final   Metapneumovirus NOT DETECTED NOT DETECTED Final   Rhinovirus / Enterovirus NOT DETECTED NOT DETECTED Final   Influenza A NOT DETECTED NOT DETECTED Final   Influenza A H1 NOT DETECTED NOT DETECTED Final   Influenza A H1 2009 NOT DETECTED NOT DETECTED Final   Influenza A H3 NOT DETECTED NOT DETECTED Final   Influenza B NOT DETECTED NOT DETECTED Final   Parainfluenza Virus 1 NOT DETECTED NOT DETECTED Final   Parainfluenza Virus 2 NOT DETECTED NOT DETECTED Final   Parainfluenza Virus 3 NOT DETECTED NOT DETECTED Final   Parainfluenza Virus 4 NOT DETECTED NOT DETECTED Final   Respiratory Syncytial Virus NOT DETECTED NOT DETECTED Final   Bordetella pertussis  NOT DETECTED NOT DETECTED Final   Chlamydophila pneumoniae NOT DETECTED NOT DETECTED Final   Mycoplasma pneumoniae NOT DETECTED NOT DETECTED Final      Radiology Studies: Dg Chest Port 1 View  06/30/2015  CLINICAL DATA:  Shortness of breath EXAM: PORTABLE CHEST 1 VIEW COMPARISON:  04/15/2015 FINDINGS: Left base atelectasis or infiltrate. Small left pole or effusion. Right lung is clear. Heart is normal size. No acute bony abnormality. IMPRESSION: Left base atelectasis or infiltrate with small left effusion. Electronically Signed   By: Charlett Nose M.D.   On: 06/30/2015 15:01     Scheduled Meds: . alprazolam  1 mg Oral QHS  . aspirin EC  81 mg Oral Daily  . atorvastatin  20 mg Oral Daily  . DULoxetine  60 mg Oral Daily  . enoxaparin (LOVENOX) injection  40 mg Subcutaneous QHS  . fluticasone  1 spray Each Nare BID  . furosemide  40 mg Oral BH-q7a  . guaiFENesin  600 mg Oral BID  . insulin aspart  0-9 Units Subcutaneous TID WC  . ipratropium-albuterol  3 mL Nebulization Q4H  . levofloxacin  500 mg Oral Daily  . losartan  50 mg Oral Daily  . methylPREDNISolone (SOLU-MEDROL)  injection  60 mg Intravenous Q6H  . pantoprazole  40 mg Oral q morning - 10a  . potassium chloride SA  20 mEq Oral Daily  . sodium chloride flush  3 mL Intravenous Q12H   Continuous Infusions:   Pamella Pert, MD, PhD [facility] Pager [phone]  If 7PM-7AM, please contact night-coverage [email or web address] Password [email or web address] 07/01/2015, 9:53 AM

## 2015-07-02 DIAGNOSIS — J441 Chronic obstructive pulmonary disease with (acute) exacerbation: Principal | ICD-10-CM

## 2015-07-02 DIAGNOSIS — E119 Type 2 diabetes mellitus without complications: Secondary | ICD-10-CM

## 2015-07-02 DIAGNOSIS — D72829 Elevated white blood cell count, unspecified: Secondary | ICD-10-CM

## 2015-07-02 DIAGNOSIS — I10 Essential (primary) hypertension: Secondary | ICD-10-CM

## 2015-07-02 DIAGNOSIS — J9622 Acute and chronic respiratory failure with hypercapnia: Secondary | ICD-10-CM

## 2015-07-02 DIAGNOSIS — J189 Pneumonia, unspecified organism: Secondary | ICD-10-CM

## 2015-07-02 DIAGNOSIS — Z720 Tobacco use: Secondary | ICD-10-CM

## 2015-07-02 DIAGNOSIS — J9621 Acute and chronic respiratory failure with hypoxia: Secondary | ICD-10-CM

## 2015-07-02 DIAGNOSIS — J449 Chronic obstructive pulmonary disease, unspecified: Secondary | ICD-10-CM

## 2015-07-02 LAB — CBC
HCT: 30.8 % — ABNORMAL LOW (ref 36.0–46.0)
Hemoglobin: 9 g/dL — ABNORMAL LOW (ref 12.0–15.0)
MCH: 24.3 pg — AB (ref 26.0–34.0)
MCHC: 29.2 g/dL — ABNORMAL LOW (ref 30.0–36.0)
MCV: 83 fL (ref 78.0–100.0)
PLATELETS: 310 10*3/uL (ref 150–400)
RBC: 3.71 MIL/uL — ABNORMAL LOW (ref 3.87–5.11)
RDW: 15.8 % — AB (ref 11.5–15.5)
WBC: 20.5 10*3/uL — ABNORMAL HIGH (ref 4.0–10.5)

## 2015-07-02 LAB — FOLATE RBC
FOLATE, HEMOLYSATE: 573.5 ng/mL
Folate, RBC: 1905 ng/mL (ref >498)
HEMATOCRIT: 30.1 % — AB (ref 34.0–46.6)

## 2015-07-02 LAB — BASIC METABOLIC PANEL
Anion gap: 7 (ref 5–15)
BUN: 22 mg/dL — AB (ref 6–20)
CALCIUM: 8.9 mg/dL (ref 8.9–10.3)
CO2: 36 mmol/L — ABNORMAL HIGH (ref 22–32)
CREATININE: 0.79 mg/dL (ref 0.44–1.00)
Chloride: 95 mmol/L — ABNORMAL LOW (ref 101–111)
GFR calc Af Amer: >60 mL/min (ref >60)
GLUCOSE: 149 mg/dL — AB (ref 65–99)
Potassium: 4.5 mmol/L (ref 3.5–5.1)
SODIUM: 138 mmol/L (ref 135–145)

## 2015-07-02 LAB — GLUCOSE, CAPILLARY
GLUCOSE-CAPILLARY: 141 mg/dL — AB (ref 65–99)
GLUCOSE-CAPILLARY: 132 mg/dL — AB (ref 65–99)
Glucose-Capillary: 130 mg/dL — ABNORMAL HIGH (ref 65–99)
Glucose-Capillary: 176 mg/dL — ABNORMAL HIGH (ref 65–99)

## 2015-07-02 MED ORDER — IPRATROPIUM-ALBUTEROL 0.5-2.5 (3) MG/3ML IN SOLN
3.0000 mL | Freq: Four times a day (QID) | RESPIRATORY_TRACT | Status: DC
  Administered 2015-07-02 – 2015-07-06 (×18): 3 mL via RESPIRATORY_TRACT
  Filled 2015-07-02 (×4): qty 3
  Filled 2015-07-02: qty 39
  Filled 2015-07-02 (×13): qty 3

## 2015-07-02 NOTE — Progress Notes (Signed)
Assessment done on patient changed to QID txs based on score

## 2015-07-02 NOTE — Progress Notes (Signed)
PROGRESS NOTE  Nicole Maxwell ZOX:[id number] DOB: 09-09-1945 DOA: 06/30/2015 PCP: [name], MD   LOS: 1 day   Brief Narrative: 70 y.o. female with a Past Medical History of HTN, PVD, Tobaco abuse, esophageal dysmotility who presents with acute on chronic respiratory failure in the setting of COPD exacerbation.   Assessment & Plan: Principal Problem:   Acute on chronic respiratory failure (HCC) Active Problems:   Severe chronic obstructive pulmonary disease (HCC)   HTN (hypertension)   Hyperlipidemia   COPD exacerbation (HCC)   Diabetes mellitus type 2, noninsulin dependent (HCC)   Chronic diastolic congestive heart failure (HCC)   Leukocytosis   Anemia   CAP (community acquired pneumonia)   Acute encephalopathy   Tobacco use   Acute on chronic hypoxic and hypercarbic respiratory failure - related to COPD exacerbation.  - She is on oxygen at baseline and CPAP at night but is noncompliant with this. - unfortunately did not have CPAP last night, ordered today. - Continue scheduled nebs - Continue Solu-Medrol - Continue oxygen supplementation - continue Levaquin - slowly improving, still needs IV solumedrola nd IV levaquin.   COPD exacerbation  - Recent hospitalization for COPD flare hypercarbic respiratory failure positive rhinovirus and left lower lobe pneumonia - continue steroids, nebulizer, antibiotics  Query community-acquired pneumonia  - Chest x-ray atelectasis with small effusion and possible infiltrate.  - clinically monitor, monitor cultures, continue Levaquin  Diabetes non-insulin-dependent - Likely related to chronic use of steroids. Serum glucose on admission 122 - We'll monitor - Carb modified diet - Hold metformin - Sliding scale insulin CBG (last 3)   Recent Labs  07/01/15 2133 07/02/15 0821 07/02/15 1203  GLUCAP 162* 141* 130*      Chronic diastolic heart failure  - Compensated. BNP 133. Echo done February 2017 shows EF of 55% rate 1  diastolic dysfunction mild LVH. Home medications include lasix, losartan - Continue home meds  Hypertension - good control. continue current medications  Acute encephalopathy - likely related to hypercarbia, improved today.   Tobacco use - Patient stopped smoking 1 month ago according to chart. Cessation support offered   Gram positive bacteremia: Vancomycin added and the sensitivities and identification are pending.  Repeat blood cultures ordered.    Leukocytosis probably from steroids.   Anemia: Normocytic, monitor.    DVT prophylaxis: Lovenox Code Status: DNR Family Communication: no family bedside Disposition Plan: home 3-4 days  Consultants:   None   Procedures:   None   Antimicrobials:  Levaquin 6/5 >>   Vancomycin 6/6  Subjective: Wants to go home.  Objective: Filed Vitals:   07/02/15 0922 07/02/15 1259 07/02/15 1522 07/02/15 1614  BP:   134/58   Pulse:   100   Temp:   98.5 F (36.9 C)   TempSrc:      Resp:   18   Height:      Weight:      SpO2: 92% 97% 100% 97%    Intake/Output Summary (Last 24 hours) at 07/02/15 1717 Last data filed at 07/02/15 1508  Gross per 24 hour  Intake   1220 ml  Output   1150 ml  Net     70 ml   Filed Weights   06/30/15 1903 07/01/15 0435 07/02/15 0259  Weight: 55.4 kg (122 lb 2.2 oz) 55.4 kg (122 lb 2.2 oz) 52.073 kg (114 lb 12.8 oz)    Examination: Constitutional: NAD, confused Filed Vitals:   07/02/15 0922 07/02/15 1259 07/02/15 1522 07/02/15 1614  BP:   134/58   Pulse:   100   Temp:   98.5 F (36.9 C)   TempSrc:      Resp:   18   Height:      Weight:      SpO2: 92% 97% 100% 97%   Eyes: PERRL, lids and conjunctivae normal ENMT: Mucous membranes are moist Respiratory:scattered wheezing heardb ilaterally.  Cardiovascular: Regular rate and rhythm, no murmurs / rubs / gallops.   Abdomen: no tenderness. Bowel sounds positive. Neurologic: non focal   Data Reviewed: I have personally reviewed  following labs and imaging studies  CBC:  Recent Labs Lab 06/30/15 1450 07/01/15 0552 07/01/15 0847 07/02/15 0451  WBC 17.5* 13.5*  --  20.5*  NEUTROABS 14.3*  --   --   --   HGB 9.5* 8.7*  --  9.0*  HCT 36.1 31.4* 30.1* 30.8*  MCV 88.9 84.9  --  83.0  PLT 285 314  --  310   Basic Metabolic Panel:  Recent Labs Lab 06/30/15 1450 07/01/15 0552 07/02/15 0451  NA 143 138 138  K 4.2 4.7 4.5  CL 96* 93* 95*  CO2 37* 34* 36*  GLUCOSE 126* 150* 149*  BUN 6 14 22*  CREATININE 0.67 0.71 0.79  CALCIUM 9.6 9.5 8.9   GFR: Estimated Creatinine Clearance: 51.8 mL/min (by C-G formula based on Cr of 0.79). Liver Function Tests: No results for input(s): AST, ALT, ALKPHOS, BILITOT, PROT, ALBUMIN in the last 168 hours. No results for input(s): LIPASE, AMYLASE in the last 168 hours. No results for input(s): AMMONIA in the last 168 hours. Coagulation Profile: No results for input(s): INR, PROTIME in the last 168 hours. Cardiac Enzymes: No results for input(s): CKTOTAL, CKMB, CKMBINDEX, TROPONINI in the last 168 hours. BNP (last 3 results) No results for input(s): PROBNP in the last 8760 hours. HbA1C: No results for input(s): HGBA1C in the last 72 hours. CBG:  Recent Labs Lab 07/01/15 1234 07/01/15 1653 07/01/15 2133 07/02/15 0821 07/02/15 1203  GLUCAP 152* 135* 162* 141* 130*   Lipid Profile: No results for input(s): CHOL, HDL, LDLCALC, TRIG, CHOLHDL, LDLDIRECT in the last 72 hours. Thyroid Function Tests:  Recent Labs  06/30/15 2110  TSH 0.273*   Anemia Panel:  Recent Labs  06/30/15 2110  VITAMINB12 716   Urine analysis:    Component Value Date/Time   COLORURINE YELLOW 03/15/2015 2336   APPEARANCEUR CLEAR 03/15/2015 2336   LABSPEC 1.020 03/15/2015 2336   PHURINE 5.5 03/15/2015 2336   GLUCOSEU NEGATIVE 03/15/2015 2336   HGBUR NEGATIVE 03/15/2015 2336   BILIRUBINUR NEGATIVE 03/15/2015 2336   KETONESUR NEGATIVE 03/15/2015 2336   PROTEINUR 30* 03/15/2015  2336   NITRITE NEGATIVE 03/15/2015 2336   LEUKOCYTESUR NEGATIVE 03/15/2015 2336   Sepsis Labs: Invalid input(s): PROCALCITONIN, LACTICIDVEN  Recent Results (from the past 240 hour(s))  Blood culture (routine x 2)     Status: Abnormal (Preliminary result)   Collection Time: 06/30/15  4:23 PM  Result Value Ref Range Status   Specimen Description BLOOD LEFT HAND  Final   Special Requests BOTTLES DRAWN AEROBIC AND ANAEROBIC 3CC  Final   Culture  Setup Time   Final    GRAM POSITIVE COCCI IN CLUSTERS AEROBIC BOTTLE ONLY Organism ID to follow CRITICAL RESULT CALLED TO, READ BACK BY AND VERIFIED WITH: A JOHNSON 07/01/15 @ 1535 M VESTAL    Culture STAPHYLOCOCCUS SPECIES (COAGULASE NEGATIVE) (A)  Final   Report Status PENDING  Incomplete  Blood  culture (routine x 2)     Status: Abnormal (Preliminary result)   Collection Time: 06/30/15  4:23 PM  Result Value Ref Range Status   Specimen Description BLOOD LEFT ANTECUBITAL  Final   Special Requests BOTTLES DRAWN AEROBIC AND ANAEROBIC 5CC  Final   Culture  Setup Time   Final    GRAM POSITIVE COCCI IN CLUSTERS IN BOTH AEROBIC AND ANAEROBIC BOTTLES CRITICAL RESULT CALLED TO, READ BACK BY AND VERIFIED WITH: T JOHNSON 07/01/15 @ 1630 M VESTAL    Culture STAPHYLOCOCCUS SPECIES (COAGULASE NEGATIVE) (A)  Final   Report Status PENDING  Incomplete  Blood Culture ID Panel (Reflexed)     Status: Abnormal   Collection Time: 06/30/15  4:23 PM  Result Value Ref Range Status   Enterococcus species NOT DETECTED NOT DETECTED Final   Vancomycin resistance NOT DETECTED NOT DETECTED Final   Listeria monocytogenes NOT DETECTED NOT DETECTED Final   Staphylococcus species DETECTED (A) NOT DETECTED Final    Comment: CRITICAL RESULT CALLED TO, READ BACK BY AND VERIFIED WITH: A JOHNSON 07/01/15 @ 1535 M VESTAL    Staphylococcus aureus NOT DETECTED NOT DETECTED Final   Methicillin resistance DETECTED (A) NOT DETECTED Final    Comment: CRITICAL RESULT CALLED TO, READ  BACK BY AND VERIFIED WITH: A JOHNSON 07/01/15 @ 1535 M VESTAL    Streptococcus species NOT DETECTED NOT DETECTED Final   Streptococcus agalactiae NOT DETECTED NOT DETECTED Final   Streptococcus pneumoniae NOT DETECTED NOT DETECTED Final   Streptococcus pyogenes NOT DETECTED NOT DETECTED Final   Acinetobacter baumannii NOT DETECTED NOT DETECTED Final   Enterobacteriaceae species NOT DETECTED NOT DETECTED Final   Enterobacter cloacae complex NOT DETECTED NOT DETECTED Final   Escherichia coli NOT DETECTED NOT DETECTED Final   Klebsiella oxytoca NOT DETECTED NOT DETECTED Final   Klebsiella pneumoniae NOT DETECTED NOT DETECTED Final   Proteus species NOT DETECTED NOT DETECTED Final   Serratia marcescens NOT DETECTED NOT DETECTED Final   Carbapenem resistance NOT DETECTED NOT DETECTED Final   Haemophilus influenzae NOT DETECTED NOT DETECTED Final   Neisseria meningitidis NOT DETECTED NOT DETECTED Final   Pseudomonas aeruginosa NOT DETECTED NOT DETECTED Final   Candida albicans NOT DETECTED NOT DETECTED Final   Candida glabrata NOT DETECTED NOT DETECTED Final   Candida krusei NOT DETECTED NOT DETECTED Final   Candida parapsilosis NOT DETECTED NOT DETECTED Final   Candida tropicalis NOT DETECTED NOT DETECTED Final  Respiratory Panel by PCR     Status: None   Collection Time: 06/30/15  4:34 PM  Result Value Ref Range Status   Adenovirus NOT DETECTED NOT DETECTED Final   Coronavirus 229E NOT DETECTED NOT DETECTED Final   Coronavirus HKU1 NOT DETECTED NOT DETECTED Final   Coronavirus NL63 NOT DETECTED NOT DETECTED Final   Coronavirus OC43 NOT DETECTED NOT DETECTED Final   Metapneumovirus NOT DETECTED NOT DETECTED Final   Rhinovirus / Enterovirus NOT DETECTED NOT DETECTED Final   Influenza A NOT DETECTED NOT DETECTED Final   Influenza A H1 NOT DETECTED NOT DETECTED Final   Influenza A H1 2009 NOT DETECTED NOT DETECTED Final   Influenza A H3 NOT DETECTED NOT DETECTED Final   Influenza B NOT  DETECTED NOT DETECTED Final   Parainfluenza Virus 1 NOT DETECTED NOT DETECTED Final   Parainfluenza Virus 2 NOT DETECTED NOT DETECTED Final   Parainfluenza Virus 3 NOT DETECTED NOT DETECTED Final   Parainfluenza Virus 4 NOT DETECTED NOT DETECTED Final   Respiratory  Syncytial Virus NOT DETECTED NOT DETECTED Final   Bordetella pertussis NOT DETECTED NOT DETECTED Final   Chlamydophila pneumoniae NOT DETECTED NOT DETECTED Final   Mycoplasma pneumoniae NOT DETECTED NOT DETECTED Final      Radiology Studies: No results found.   Scheduled Meds: . alprazolam  1 mg Oral QHS  . aspirin EC  81 mg Oral Daily  . atorvastatin  20 mg Oral Daily  . DULoxetine  60 mg Oral Daily  . enoxaparin (LOVENOX) injection  40 mg Subcutaneous QHS  . fluticasone  1 spray Each Nare BID  . furosemide  40 mg Oral BH-q7a  . guaiFENesin  600 mg Oral BID  . insulin aspart  0-9 Units Subcutaneous TID WC  . ipratropium-albuterol  3 mL Nebulization QID  . levofloxacin  500 mg Oral Daily  . losartan  50 mg Oral Daily  . methylPREDNISolone (SOLU-MEDROL) injection  60 mg Intravenous Q6H  . pantoprazole  40 mg Oral q morning - 10a  . potassium chloride SA  20 mEq Oral Daily  . sodium chloride flush  3 mL Intravenous Q12H  . vancomycin  500 mg Intravenous Q12H   Continuous Infusions:   Kathlen ModyVijaya Jalene Lacko,  MD,  [facility] Pager [phone]  If 7PM-7AM, please contact night-coverage [email or web address] Password [email or web address] 07/02/2015, 5:17 PM

## 2015-07-02 NOTE — Progress Notes (Signed)
Took Cpap to patients room to set up. Pt  states she dont wont to wear it because she is sleepy.  Advised patient if she changes her mind to let me know

## 2015-07-03 ENCOUNTER — Inpatient Hospital Stay (HOSPITAL_COMMUNITY): Payer: Medicare Other

## 2015-07-03 LAB — GLUCOSE, CAPILLARY
GLUCOSE-CAPILLARY: 116 mg/dL — AB (ref 65–99)
GLUCOSE-CAPILLARY: 115 mg/dL — AB (ref 65–99)
GLUCOSE-CAPILLARY: 141 mg/dL — AB (ref 65–99)
Glucose-Capillary: 131 mg/dL — ABNORMAL HIGH (ref 65–99)

## 2015-07-03 LAB — CULTURE, BLOOD (ROUTINE X 2)

## 2015-07-03 LAB — URINE CULTURE

## 2015-07-03 LAB — VANCOMYCIN, TROUGH: VANCOMYCIN TR: 13 ug/mL (ref 10.0–20.0)

## 2015-07-03 MED ORDER — VANCOMYCIN HCL IN DEXTROSE 750-5 MG/150ML-% IV SOLN
750.0000 mg | Freq: Two times a day (BID) | INTRAVENOUS | Status: DC
  Administered 2015-07-03 – 2015-07-04 (×2): 750 mg via INTRAVENOUS
  Filled 2015-07-03 (×4): qty 150

## 2015-07-03 MED ORDER — METHYLPREDNISOLONE SODIUM SUCC 125 MG IJ SOLR
60.0000 mg | INTRAMUSCULAR | Status: DC
  Administered 2015-07-04 – 2015-07-05 (×2): 60 mg via INTRAVENOUS
  Filled 2015-07-03 (×2): qty 2

## 2015-07-03 NOTE — Progress Notes (Signed)
Occupational Therapy Treatment Patient Details Name: Nicole Maxwell MRN: [id number] DOB: Jun 22, 1945 Today's Date: 07/03/2015    History of present illness 70 y.o. female admitted for increased SOB, myalgias, and confusion. PMH significant for HTN, HLD, COPD on 2L O2 at home, SOB, PVD, chronic back pain, osteopenia, DM type II, arthritis, and anemia.   OT comments  Pt's cognition has improved from previous sessions, but still required supervision for ADL tasks due to some impulsivity and O2 line management. Pt on 2L O2 [place] with SpO2 85-90% during activity. Continue to recommend HHOT, pt has all necessary DME at home. Will continue to follow acutely.    Follow Up Recommendations  Home health OT;Supervision/Assistance - 24 hour    Equipment Recommendations  None recommended by OT    Recommendations for Other Services      Precautions / Restrictions Precautions Precautions: Fall Restrictions Weight Bearing Restrictions: No       Mobility Bed Mobility               General bed mobility comments: Pu up in chair on OT arrival  Transfers Overall transfer level: Needs assistance Equipment used: Rolling walker (2 wheeled) Transfers: Sit to/from Stand Sit to Stand: Supervision         General transfer comment: Supervision for safety and O2 line management. Pt impulsive at times and reported increased fatigue.    Balance Overall balance assessment: Needs assistance Sitting-balance support: No upper extremity supported;Feet supported Sitting balance-Leahy Scale: Good     Standing balance support: No upper extremity supported;During functional activity Standing balance-Leahy Scale: Fair Standing balance comment: able to complete bathing tasks at sink without UE support for static standing                    ADL Overall ADL's : Needs assistance/impaired     Grooming: Oral care;Wash/dry face;Wash/dry hands;Brushing hair;Supervision/safety;Standing   Upper Body  Bathing: Set up;Sitting   Lower Body Bathing: Set up;Sit to/from stand   Upper Body Dressing : Set up;Sitting   Lower Body Dressing: Set up;Sit to/from stand   Toilet Transfer: Supervision/safety;Ambulation;BSC;RW;Cueing for safety Toilet Transfer Details (indicate cue type and reason): Cues for safe hand placement Toileting- Clothing Manipulation and Hygiene: Supervision/safety;Sit to/from stand       Functional mobility during ADLs: Supervision/safety;Rolling walker General ADL Comments: Pt with audible wheezing and reported increased fatigue with bathing tasks - educated pt on energy conservation strategies surrounding these.      Vision                     Perception     Praxis      Cognition   Behavior During Therapy: Flat affect Overall Cognitive Status: Impaired/Different from baseline Area of Impairment: Safety/judgement;Problem solving          Safety/Judgement: Decreased awareness of safety   Problem Solving: Requires verbal cues General Comments: Pt reported increased frustration with nursing staff, but had difficulty articulating why. Pt also attempted to use body soap as toothpaste - unsure if this was due to vision issue    Extremity/Trunk Assessment               Exercises     Shoulder Instructions       General Comments      Pertinent Vitals/ Pain       Pain Assessment: No/denies pain  Home Living  Prior Functioning/Environment              Frequency Min 2X/week     Progress Toward Goals  OT Goals(current goals can now be found in the care plan section)  Progress towards OT goals: Progressing toward goals  Acute Rehab OT Goals Patient Stated Goal: none stated OT Goal Formulation: With patient Time For Goal Achievement: 07/15/15 Potential to Achieve Goals: Good ADL Goals Pt Will Perform Upper Body Bathing: with modified independence;sitting Pt Will  Perform Lower Body Bathing: with modified independence;sit to/from stand Pt Will Transfer to Toilet: with modified independence;regular height toilet;ambulating Pt Will Perform Toileting - Clothing Manipulation and hygiene: with modified independence;sitting/lateral leans;sit to/from stand Pt Will Perform Tub/Shower Transfer: Tub transfer;with modified independence;ambulating;shower seat  Plan Discharge plan remains appropriate    Co-evaluation                 End of Session Equipment Utilized During Treatment: Gait belt;Rolling walker;Oxygen   Activity Tolerance Patient tolerated treatment well   Patient Left in chair;with call bell/phone within reach;with chair alarm set   Nurse Communication Mobility status        Time: 1610-9604 OT Time Calculation (min): 28 min  Charges: OT General Charges $OT Visit: 1 Procedure OT Treatments $Self Care/Home Management : 23-37 mins  Nils Pyle, OTR/L Pager: [phone] 07/03/2015, 2:13 PM

## 2015-07-03 NOTE — Progress Notes (Signed)
ANTIBIOTIC CONSULT NOTE - f/u consult  Pharmacy Consult for Vancomycin Indication: bacteremia  Allergies  Allergen Reactions  . Brovana [Arformoterol Tartrate] Shortness Of Breath and Cough    General intolerance  . Budesonide Shortness Of Breath and Cough    General intolerance  . Mucomyst [Acetylcysteine] Shortness Of Breath    Patient Measurements: Height: 5\' 2"  (157.5 cm) Weight: 114 lb 12.8 oz (52.073 kg) IBW/kg (Calculated) : 50.1 Adjusted Body Weight:    Vital Signs: Temp: 98.9 F (37.2 C) (06/08 1508) Temp Source: Oral (06/08 1508) BP: 114/51 mmHg (06/08 1508) Pulse Rate: 92 (06/08 1508) Intake/Output from previous day: 06/07 0701 - 06/08 0700 In: 1000 [P.O.:900; IV Piggyback:100] Out: 1100 [Urine:1100] Intake/Output from this shift: Total I/O In: -  Out: 700 [Urine:700]  Labs:  Recent Labs  07/01/15 0552 07/02/15 0451  WBC 13.5* 20.5*  HGB 8.7* 9.0*  PLT 314 310  CREATININE 0.71 0.79   Estimated Creatinine Clearance: 51.8 mL/min (by C-G formula based on Cr of 0.79).  Recent Labs  07/03/15 1640  VANCOTROUGH 13     Microbiology:   Medical History: Past Medical History  Diagnosis Date  . Hypertension   . Chronic back pain     "goes from the lower part of my back on down my legs"  (04/17/2014)  . Osteopenia   . PVD (peripheral vascular disease) (HCC)   . Tobacco abuse     " I am addicted"   . Vitamin D deficiency disease     03/26/2010 - 9.9ng/mL. Started on replacemnt  . Alcohol abuse, in remission quit 2004  . Neuromuscular disorder (HCC)     lumbar disc  . Anxiety     Xanax prn  . Shortness of breath     with anxiety  and activty  . Asthma   . Hyperlipidemia   . On home oxygen therapy     "2L; suppose to be 24/7" (04/17/2014)  . COPD (chronic obstructive pulmonary disease) (HCC)     cxr 04/03/2010 - hyperinflation  . Pneumonia     "3 times in the last month or so" (04/17/2014)  . Type II diabetes mellitus (HCC)   . Arthritis    "my whole body"  . Physical deconditioning   . Chronic respiratory failure with hypercapnia (HCC)   . HCAP (healthcare-associated pneumonia)   . COPD exacerbation (HCC)   . Leukocytosis   . Dysphagia   . Protein calorie malnutrition (HCC)   . Esophageal dysmotility   . Anemia of chronic disease     Assessment: ID:  Vancomycin for CNS (MRSE) bacteremia, Levoflox for pna (?).  Repeat cx ordered.  WBC 20.5- inc (6/7. on steroids), AFeb, Abanda-2L (home rate).  Vanco trough slightly low for currently indication.  Antimicrobials this admission: 6/5 cefepime >> 6/5 (not given) 6/5 vanc >> x1 (not given) 6/6 vanc>> Inc to 750mg /12h --6/8: VT 13:  6/6 levoflox >>  Dose adjustments this admission: n/a  Microbiology results: 6/5 BCx: 2 out of 2 with GPCs 6/5 BCID (+) Staph sp with methicillin resistance 6/5 Resp panel PCR: negative  6/6 Urine 6/7 blood x 2:  ntd   Goal of Therapy:  Vancomycin trough level 15-20 mcg/ml  Plan:  Increase Vancomycin to 750mg  IV q12 hrs.   Sariah Henkin S. Merilynn Finlandobertson, PharmD, BCPS Clinical Staff Pharmacist Pager [phone]  Misty Stanleyobertson, Kaylob Wallen Stillinger 07/03/2015,5:38 PM

## 2015-07-03 NOTE — Progress Notes (Signed)
Physical Therapy Treatment Patient Details Name: Nicole Maxwell MRN: [id number] DOB: 16-Mar-1945 Today's Date: 07/03/2015    History of Present Illness 70 y.o. female admitted for increased SOB, myalgias, and confusion. PMH significant for HTN, HLD, COPD on 2L O2 at home, SOB, PVD, chronic back pain, osteopenia, DM type II, arthritis, and anemia.    PT Comments    Patient is making progress toward mobility goals. Pt a little impulsive but confusion has improved from previous session. Pt on 2-3 L O2 via nasal canula throughout session with O2 in low 80s and 90s throughout. Current plan remains appropriate.   Follow Up Recommendations  Home health PT;Supervision/Assistance - 24 hour     Equipment Recommendations  None recommended by PT    Recommendations for Other Services OT consult     Precautions / Restrictions Precautions Precautions: Fall Restrictions Weight Bearing Restrictions: No    Mobility  Bed Mobility Overal bed mobility: Needs Assistance Bed Mobility: Supine to Sit     Supine to sit: Supervision     General bed mobility comments: supervision for safety and increased time with HOB elevated  Transfers Overall transfer level: Needs assistance Equipment used: Rolling walker (2 wheeled) Transfers: Sit to/from Stand Sit to Stand: Min guard         General transfer comment: min guard for safety with pt standing impulsively; assist to manage O2 line  Ambulation/Gait Ambulation/Gait assistance: Min guard;Min assist Ambulation Distance (Feet): 120 Feet Assistive device: Rolling walker (2 wheeled) Gait Pattern/deviations: Step-through pattern;Decreased stride length;Trunk flexed;Narrow base of support Gait velocity: decreased   General Gait Details: grossly min guard for safety; min A at times due to unsteadiness and management of RW; cues for posture, safe use of AD, and pursed lip breathing; SpO2 decreased to 80% on 2L O2 and increased to 92% with pursed lip  breathing; SpO2 remined 86% or > on 3L O2   Stairs            Wheelchair Mobility    Modified Rankin (Stroke Patients Only)       Balance Overall balance assessment: Needs assistance Sitting-balance support: No upper extremity supported;Feet supported Sitting balance-Leahy Scale: Good     Standing balance support: During functional activity Standing balance-Leahy Scale: Fair                      Cognition Arousal/Alertness: Awake/alert Behavior During Therapy: WFL for tasks assessed/performed Overall Cognitive Status: No family/caregiver present to determine baseline cognitive functioning Area of Impairment: Safety/judgement;Problem solving         Safety/Judgement: Decreased awareness of safety   Problem Solving: Requires verbal cues General Comments: impulsive at times and needed cues to stay task focused and to safely manuever around obstacles in room    Exercises      General Comments        Pertinent Vitals/Pain Pain Assessment: No/denies pain    Home Living                      Prior Function            PT Goals (current goals can now be found in the care plan section) Acute Rehab PT Goals Patient Stated Goal: none stated Progress towards PT goals: Progressing toward goals    Frequency  Min 3X/week    PT Plan Current plan remains appropriate    Co-evaluation             End of  Session Equipment Utilized During Treatment: Gait belt;Oxygen Activity Tolerance: Patient limited by fatigue Patient left: in chair;with call bell/phone within reach;with chair alarm set     Time: 0936-1001 PT Time Calculation (min) (ACUTE ONLY): 25 min  Charges:  $Gait Training: 8-22 mins $Therapeutic Activity: 8-22 mins                    G Codes:      Derek MoundKellyn R Patterson Hollenbaugh Nicole Maxwell, PTA Pager: [phone]   07/03/2015, 10:14 AM

## 2015-07-03 NOTE — Care Management Note (Addendum)
Case Management Note  Patient Details  Name: Nicole Maxwell MRN: [id number] Date of Birth: [birth year]  Subjective/Objective:                 Spoke with patient in the room. [place] She has family that assists at times with her care. She states that she has home O2, neb through [facility]. She was on Brovana PTA. [facility] to [facility], as she states she is current with them for RN, CM verified. Patient also has walker, BSC, shower seat, cane.   Action/Plan:  Will continue to follow for DC planning.   Expected Discharge Date:                  Expected Discharge Plan:  Home w Home Health Services  In-House Referral:     Discharge planning Services  CM Consult  Post Acute Care Choice:  Resumption of Svcs/PTA Provider Choice offered to:  Patient  DME Arranged:    DME Agency:     HH Arranged:  RN, PT, OT HH Agency:  [facility]  Status of Service:  In process, will continue to follow  Medicare Important Message Given:    Date Medicare IM Given:    Medicare IM give by:    Date Additional Medicare IM Given:    Additional Medicare Important Message give by:     If discussed at Long Length of Stay Meetings, dates discussed:    Additional Comments:  Lawerance SabalDebbie Jemina Scahill, RN 07/03/2015, 1:53 PM

## 2015-07-03 NOTE — Progress Notes (Signed)
PROGRESS NOTE  Nicole Maxwell UXL:[id number] DOB: 01-16-1946 DOA: 06/30/2015 PCP: [name], MD   LOS: 2 days   Brief Narrative: 70 y.o. female with a Past Medical History of HTN, PVD, Tobaco abuse, esophageal dysmotility who presents with acute on chronic respiratory failure in the setting of COPD exacerbation.   Assessment & Plan: Principal Problem:   Acute on chronic respiratory failure (HCC) Active Problems:   Severe chronic obstructive pulmonary disease (HCC)   HTN (hypertension)   Hyperlipidemia   COPD exacerbation (HCC)   Diabetes mellitus type 2, noninsulin dependent (HCC)   Chronic diastolic congestive heart failure (HCC)   Leukocytosis   Anemia   CAP (community acquired pneumonia)   Acute encephalopathy   Tobacco use   Acute on chronic hypoxic and hypercarbic respiratory failure - related to COPD exacerbation.  - She is on oxygen at baseline and CPAP at night but is noncompliant with this. - unfortunately did not have CPAP last night, ordered today. - Continue scheduled nebs - Continue oxygen supplementation - continue Levaquin - slowly improving,taper IV steroids, wheezing has improved.   COPD exacerbation  - Recent hospitalization for COPD flare hypercarbic respiratory failure positive rhinovirus and left lower lobe pneumonia - continue steroids, nebulizer, antibiotics  Query community-acquired pneumonia  - Chest x-ray atelectasis with small effusion and possible infiltrate.  - clinically monitor, monitor cultures, continue Levaquin  Diabetes non-insulin-dependent - Likely related to chronic use of steroids. Serum glucose on admission 122 - We'll monitor - Carb modified diet - Hold metformin - Sliding scale insulin CBG (last 3)   Recent Labs  07/03/15 0814 07/03/15 1210 07/03/15 1719  GLUCAP 131* 116* 115*      Chronic diastolic heart failure  - Compensated. BNP 133. Echo done February 2017 shows EF of 55% rate 1 diastolic dysfunction mild  LVH. Home medications include lasix, losartan - Continue home meds  Hypertension - good control. continue current medications  Acute encephalopathy - likely related to hypercarbia, improved today.   Tobacco use - Patient stopped smoking 1 month ago according to chart. Cessation support offered   Gram positive bacteremia: Vancomycin added, blood cultures showed staph coag negative. Repeat blood cultures ordered,pending, will request for ID input regarding the abnormal blood cultures.    Leukocytosis probably from steroids. Repeat CBC in am.   Anemia: Normocytic, monitor.    DVT prophylaxis: Lovenox Code Status: DNR Family Communication: no family bedside Disposition Plan: home 3-4 days  Consultants:   None   Procedures:   None   Antimicrobials:  Levaquin 6/5 >>   Vancomycin 6/6  Subjective: Reports feeling much better today.   Objective: Filed Vitals:   07/02/15 2109 07/02/15 2124 07/03/15 1208 07/03/15 1508  BP:  140/67  114/51  Pulse:  91 95 92  Temp:  98.6 F (37 C)  98.9 F (37.2 C)  TempSrc:    Oral  Resp:  18 18   Height:      Weight:      SpO2: 96% 97% 98%     Intake/Output Summary (Last 24 hours) at 07/03/15 1824 Last data filed at 07/03/15 0849  Gross per 24 hour  Intake    400 ml  Output    900 ml  Net   -500 ml   Filed Weights   06/30/15 1903 07/01/15 0435 07/02/15 0259  Weight: 55.4 kg (122 lb 2.2 oz) 55.4 kg (122 lb 2.2 oz) 52.073 kg (114 lb 12.8 oz)    Examination: Constitutional: NAD, confused  Filed Vitals:   07/02/15 2109 07/02/15 2124 07/03/15 1208 07/03/15 1508  BP:  140/67  114/51  Pulse:  91 95 92  Temp:  98.6 F (37 C)  98.9 F (37.2 C)  TempSrc:    Oral  Resp:  18 18   Height:      Weight:      SpO2: 96% 97% 98%    Eyes: PERRL, lids and conjunctivae normal ENMT: Mucous membranes are moist Respiratory:scattered wheezing heardb ilaterally.  Cardiovascular: Regular rate and rhythm, no murmurs / rubs /  gallops.   Abdomen: no tenderness. Bowel sounds positive. Neurologic: non focal   Data Reviewed: I have personally reviewed following labs and imaging studies  CBC:  Recent Labs Lab 06/30/15 1450 07/01/15 0552 07/01/15 0847 07/02/15 0451  WBC 17.5* 13.5*  --  20.5*  NEUTROABS 14.3*  --   --   --   HGB 9.5* 8.7*  --  9.0*  HCT 36.1 31.4* 30.1* 30.8*  MCV 88.9 84.9  --  83.0  PLT 285 314  --  310   Basic Metabolic Panel:  Recent Labs Lab 06/30/15 1450 07/01/15 0552 07/02/15 0451  NA 143 138 138  K 4.2 4.7 4.5  CL 96* 93* 95*  CO2 37* 34* 36*  GLUCOSE 126* 150* 149*  BUN 6 14 22*  CREATININE 0.67 0.71 0.79  CALCIUM 9.6 9.5 8.9   GFR: Estimated Creatinine Clearance: 51.8 mL/min (by C-G formula based on Cr of 0.79). Liver Function Tests: No results for input(s): AST, ALT, ALKPHOS, BILITOT, PROT, ALBUMIN in the last 168 hours. No results for input(s): LIPASE, AMYLASE in the last 168 hours. No results for input(s): AMMONIA in the last 168 hours. Coagulation Profile: No results for input(s): INR, PROTIME in the last 168 hours. Cardiac Enzymes: No results for input(s): CKTOTAL, CKMB, CKMBINDEX, TROPONINI in the last 168 hours. BNP (last 3 results) No results for input(s): PROBNP in the last 8760 hours. HbA1C: No results for input(s): HGBA1C in the last 72 hours. CBG:  Recent Labs Lab 07/02/15 1719 07/02/15 2126 07/03/15 0814 07/03/15 1210 07/03/15 1719  GLUCAP 132* 176* 131* 116* 115*   Lipid Profile: No results for input(s): CHOL, HDL, LDLCALC, TRIG, CHOLHDL, LDLDIRECT in the last 72 hours. Thyroid Function Tests:  Recent Labs  06/30/15 2110  TSH 0.273*   Anemia Panel:  Recent Labs  06/30/15 2110  VITAMINB12 716   Urine analysis:    Component Value Date/Time   COLORURINE YELLOW 03/15/2015 2336   APPEARANCEUR CLEAR 03/15/2015 2336   LABSPEC 1.020 03/15/2015 2336   PHURINE 5.5 03/15/2015 2336   GLUCOSEU NEGATIVE 03/15/2015 2336   HGBUR  NEGATIVE 03/15/2015 2336   BILIRUBINUR NEGATIVE 03/15/2015 2336   KETONESUR NEGATIVE 03/15/2015 2336   PROTEINUR 30* 03/15/2015 2336   NITRITE NEGATIVE 03/15/2015 2336   LEUKOCYTESUR NEGATIVE 03/15/2015 2336   Sepsis Labs: Invalid input(s): PROCALCITONIN, LACTICIDVEN  Recent Results (from the past 240 hour(s))  Blood culture (routine x 2)     Status: Abnormal   Collection Time: 06/30/15  4:23 PM  Result Value Ref Range Status   Specimen Description BLOOD LEFT HAND  Final   Special Requests BOTTLES DRAWN AEROBIC AND ANAEROBIC 3CC  Final   Culture  Setup Time   Final    GRAM POSITIVE COCCI IN CLUSTERS AEROBIC BOTTLE ONLY Organism ID to follow CRITICAL RESULT CALLED TO, READ BACK BY AND VERIFIED WITH: A JOHNSON 07/01/15 @ 1535 M VESTAL    Culture STAPHYLOCOCCUS SPECIES (  COAGULASE NEGATIVE) (A)  Final   Report Status 07/03/2015 FINAL  Final   Organism ID, Bacteria STAPHYLOCOCCUS SPECIES (COAGULASE NEGATIVE)  Final      Susceptibility   Staphylococcus species (coagulase negative) - MIC*    CIPROFLOXACIN 4 RESISTANT Resistant     ERYTHROMYCIN >=8 RESISTANT Resistant     GENTAMICIN <=0.5 SENSITIVE Sensitive     OXACILLIN <=0.25 RESISTANT Resistant     TETRACYCLINE 2 SENSITIVE Sensitive     VANCOMYCIN 2 SENSITIVE Sensitive     TRIMETH/SULFA 160 RESISTANT Resistant     CLINDAMYCIN <=0.25 SENSITIVE Sensitive     RIFAMPIN <=0.5 SENSITIVE Sensitive     Inducible Clindamycin NEGATIVE Sensitive     * STAPHYLOCOCCUS SPECIES (COAGULASE NEGATIVE)  Blood culture (routine x 2)     Status: Abnormal   Collection Time: 06/30/15  4:23 PM  Result Value Ref Range Status   Specimen Description BLOOD LEFT ANTECUBITAL  Final   Special Requests BOTTLES DRAWN AEROBIC AND ANAEROBIC 5CC  Final   Culture  Setup Time   Final    GRAM POSITIVE COCCI IN CLUSTERS IN BOTH AEROBIC AND ANAEROBIC BOTTLES CRITICAL RESULT CALLED TO, READ BACK BY AND VERIFIED WITH: T JOHNSON 07/01/15 @ 1630 M VESTAL    Culture (A)   Final    STAPHYLOCOCCUS SPECIES (COAGULASE NEGATIVE) SUSCEPTIBILITIES PERFORMED ON PREVIOUS CULTURE WITHIN THE LAST 5 DAYS.    Report Status 07/03/2015 FINAL  Final  Blood Culture ID Panel (Reflexed)     Status: Abnormal   Collection Time: 06/30/15  4:23 PM  Result Value Ref Range Status   Enterococcus species NOT DETECTED NOT DETECTED Final   Vancomycin resistance NOT DETECTED NOT DETECTED Final   Listeria monocytogenes NOT DETECTED NOT DETECTED Final   Staphylococcus species DETECTED (A) NOT DETECTED Final    Comment: CRITICAL RESULT CALLED TO, READ BACK BY AND VERIFIED WITH: A JOHNSON 07/01/15 @ 1535 M VESTAL    Staphylococcus aureus NOT DETECTED NOT DETECTED Final   Methicillin resistance DETECTED (A) NOT DETECTED Final    Comment: CRITICAL RESULT CALLED TO, READ BACK BY AND VERIFIED WITH: A JOHNSON 07/01/15 @ 1535 M VESTAL    Streptococcus species NOT DETECTED NOT DETECTED Final   Streptococcus agalactiae NOT DETECTED NOT DETECTED Final   Streptococcus pneumoniae NOT DETECTED NOT DETECTED Final   Streptococcus pyogenes NOT DETECTED NOT DETECTED Final   Acinetobacter baumannii NOT DETECTED NOT DETECTED Final   Enterobacteriaceae species NOT DETECTED NOT DETECTED Final   Enterobacter cloacae complex NOT DETECTED NOT DETECTED Final   Escherichia coli NOT DETECTED NOT DETECTED Final   Klebsiella oxytoca NOT DETECTED NOT DETECTED Final   Klebsiella pneumoniae NOT DETECTED NOT DETECTED Final   Proteus species NOT DETECTED NOT DETECTED Final   Serratia marcescens NOT DETECTED NOT DETECTED Final   Carbapenem resistance NOT DETECTED NOT DETECTED Final   Haemophilus influenzae NOT DETECTED NOT DETECTED Final   Neisseria meningitidis NOT DETECTED NOT DETECTED Final   Pseudomonas aeruginosa NOT DETECTED NOT DETECTED Final   Candida albicans NOT DETECTED NOT DETECTED Final   Candida glabrata NOT DETECTED NOT DETECTED Final   Candida krusei NOT DETECTED NOT DETECTED Final   Candida  parapsilosis NOT DETECTED NOT DETECTED Final   Candida tropicalis NOT DETECTED NOT DETECTED Final  Respiratory Panel by PCR     Status: None   Collection Time: 06/30/15  4:34 PM  Result Value Ref Range Status   Adenovirus NOT DETECTED NOT DETECTED Final   Coronavirus 229E NOT  DETECTED NOT DETECTED Final   Coronavirus HKU1 NOT DETECTED NOT DETECTED Final   Coronavirus NL63 NOT DETECTED NOT DETECTED Final   Coronavirus OC43 NOT DETECTED NOT DETECTED Final   Metapneumovirus NOT DETECTED NOT DETECTED Final   Rhinovirus / Enterovirus NOT DETECTED NOT DETECTED Final   Influenza A NOT DETECTED NOT DETECTED Final   Influenza A H1 NOT DETECTED NOT DETECTED Final   Influenza A H1 2009 NOT DETECTED NOT DETECTED Final   Influenza A H3 NOT DETECTED NOT DETECTED Final   Influenza B NOT DETECTED NOT DETECTED Final   Parainfluenza Virus 1 NOT DETECTED NOT DETECTED Final   Parainfluenza Virus 2 NOT DETECTED NOT DETECTED Final   Parainfluenza Virus 3 NOT DETECTED NOT DETECTED Final   Parainfluenza Virus 4 NOT DETECTED NOT DETECTED Final   Respiratory Syncytial Virus NOT DETECTED NOT DETECTED Final   Bordetella pertussis NOT DETECTED NOT DETECTED Final   Chlamydophila pneumoniae NOT DETECTED NOT DETECTED Final   Mycoplasma pneumoniae NOT DETECTED NOT DETECTED Final  Culture, Urine     Status: Abnormal   Collection Time: 07/01/15  7:55 PM  Result Value Ref Range Status   Specimen Description URINE, CLEAN CATCH  Final   Special Requests NONE  Final   Culture MULTIPLE SPECIES PRESENT, SUGGEST RECOLLECTION (A)  Final   Report Status 07/03/2015 FINAL  Final  Culture, blood (Routine X 2) w Reflex to ID Panel     Status: None (Preliminary result)   Collection Time: 07/02/15 11:25 AM  Result Value Ref Range Status   Specimen Description BLOOD RIGHT ARM  Final   Special Requests BOTTLES DRAWN AEROBIC AND ANAEROBIC  6CC  Final   Culture NO GROWTH 1 DAY  Final   Report Status PENDING  Incomplete  Culture,  blood (Routine X 2) w Reflex to ID Panel     Status: None (Preliminary result)   Collection Time: 07/02/15 11:35 AM  Result Value Ref Range Status   Specimen Description BLOOD LEFT ARM  Final   Special Requests BOTTLES DRAWN AEROBIC AND ANAEROBIC  6CC  Final   Culture NO GROWTH 1 DAY  Final   Report Status PENDING  Incomplete      Radiology Studies: Dg Chest 2 View  07/03/2015  CLINICAL DATA:  COPD EXAM: CHEST  2 VIEW COMPARISON:  06/30/2015 FINDINGS: Cardiac shadow is within normal limits. The lungs are hyperinflated bilaterally. Minimal left basilar atelectasis and small effusion is noted. These changes are stable from the recent exam. No new focal infiltrate or sizable pneumothorax is seen. No bony abnormality is noted. IMPRESSION: COPD with mild left basilar atelectasis and small left pleural effusion stable from the recent exam. Electronically Signed   By: Alcide Clever M.D.   On: 07/03/2015 16:57     Scheduled Meds: . alprazolam  1 mg Oral QHS  . aspirin EC  81 mg Oral Daily  . atorvastatin  20 mg Oral Daily  . DULoxetine  60 mg Oral Daily  . enoxaparin (LOVENOX) injection  40 mg Subcutaneous QHS  . fluticasone  1 spray Each Nare BID  . furosemide  40 mg Oral [id number]  . guaiFENesin  600 mg Oral BID  . insulin aspart  0-9 Units Subcutaneous TID WC  . ipratropium-albuterol  3 mL Nebulization QID  . levofloxacin  500 mg Oral Daily  . losartan  50 mg Oral Daily  . [START ON 07/04/2015] methylPREDNISolone (SOLU-MEDROL) injection  60 mg Intravenous Q24H  . pantoprazole  40 mg  Oral q morning - 10a  . potassium chloride SA  20 mEq Oral Daily  . sodium chloride flush  3 mL Intravenous Q12H  . vancomycin  750 mg Intravenous Q12H   Continuous Infusions:   Kathlen Mody,  MD,  [facility] Pager [phone]  If 7PM-7AM, please contact night-coverage [email or web address] Password [facility] 07/03/2015, 6:24 PM

## 2015-07-04 LAB — CBC
HCT: 33.2 % — ABNORMAL LOW (ref 36.0–46.0)
Hemoglobin: 9.5 g/dL — ABNORMAL LOW (ref 12.0–15.0)
MCH: 24.1 pg — ABNORMAL LOW (ref 26.0–34.0)
MCHC: 28.6 g/dL — AB (ref 30.0–36.0)
MCV: 84.1 fL (ref 78.0–100.0)
PLATELETS: 315 10*3/uL (ref 150–400)
RBC: 3.95 MIL/uL (ref 3.87–5.11)
RDW: 15.8 % — AB (ref 11.5–15.5)
WBC: 12.7 10*3/uL — AB (ref 4.0–10.5)

## 2015-07-04 LAB — GLUCOSE, CAPILLARY
GLUCOSE-CAPILLARY: 90 mg/dL (ref 65–99)
GLUCOSE-CAPILLARY: 122 mg/dL — AB (ref 65–99)
GLUCOSE-CAPILLARY: 181 mg/dL — AB (ref 65–99)

## 2015-07-04 MED ORDER — LORATADINE 10 MG PO TABS
10.0000 mg | ORAL_TABLET | Freq: Every day | ORAL | Status: DC
  Administered 2015-07-04 – 2015-07-06 (×3): 10 mg via ORAL
  Filled 2015-07-04 (×3): qty 1

## 2015-07-04 MED ORDER — ALPRAZOLAM 0.5 MG PO TABS
0.5000 mg | ORAL_TABLET | Freq: Every day | ORAL | Status: DC | PRN
  Administered 2015-07-06: 0.5 mg via ORAL
  Filled 2015-07-04: qty 1

## 2015-07-04 NOTE — Care Management Important Message (Signed)
Important Message  Patient Details  Name: Nicole Maxwell MRN: [id number] Date of Birth: 1946-01-18   Medicare Important Message Given:  Yes    [name], Lauren Aguayo Abena 07/04/2015, 11:17 AM

## 2015-07-04 NOTE — Progress Notes (Signed)
Physical Therapy Treatment Patient Details Name: Nicole Maxwell MRN: [id number] DOB: Sep 14, 1945 Today's Date: 07/04/2015    History of Present Illness 70 y.o. female admitted for increased SOB, myalgias, and confusion. PMH significant for HTN, HLD, COPD on 2L O2 at home, SOB, PVD, chronic back pain, osteopenia, DM type II, arthritis, and anemia.    PT Comments    Pt was able to walk with RW and O2, but still gets DOE on 3 L O2 [place] and by end of gait O2 sats were 85%, returned quickly to >90% with seated rest break.    Follow Up Recommendations  Home health PT;Supervision/Assistance - 24 hour     Equipment Recommendations  None recommended by PT    Recommendations for Other Services   NA     Precautions / Restrictions Precautions Precautions: Fall Restrictions Weight Bearing Restrictions: No    Mobility  Bed Mobility Overal bed mobility: Needs Assistance Bed Mobility: Sit to Supine       Sit to supine: Supervision   General bed mobility comments: supervision for safety due to extra time needed to complete task  Transfers Overall transfer level: Needs assistance Equipment used: Rolling walker (2 wheeled) Transfers: Sit to/from Stand Sit to Stand: Supervision         General transfer comment: supervision for safety due to reliance on hands for transitions.  Cues to stay untangled from O2 line.   Ambulation/Gait Ambulation/Gait assistance: Min guard Ambulation Distance (Feet): 180 Feet Assistive device: Rolling walker (2 wheeled) Gait Pattern/deviations: Step-through pattern;Staggering left;Staggering right     General Gait Details: even with RW mildly staggering gait pattern.  DOE 3/4 by the end of gait and O2 sats dropeed to 85% on 3 L O2 [place]          Balance Overall balance assessment: Needs assistance Sitting-balance support: No upper extremity supported;Feet supported Sitting balance-Leahy Scale: Good     Standing balance support: No upper extremity  supported Standing balance-Leahy Scale: Fair                      Cognition Arousal/Alertness: Awake/alert Behavior During Therapy: WFL for tasks assessed/performed Overall Cognitive Status: Impaired/Different from baseline Area of Impairment: Safety/judgement;Problem solving         Safety/Judgement: Decreased awareness of safety   Problem Solving: Requires verbal cues General Comments: Pt at times saying things that were odd, not pretaining to current conversation.             Pertinent Vitals/Pain Pain Assessment: No/denies pain           PT Goals (current goals can now be found in the care plan section) Acute Rehab PT Goals Patient Stated Goal: none stated Progress towards PT goals: Progressing toward goals    Frequency  Min 3X/week    PT Plan Current plan remains appropriate       End of Session Equipment Utilized During Treatment: Oxygen Activity Tolerance: Patient limited by fatigue Patient left: in bed;with call bell/phone within reach     Time: 1329-1349 PT Time Calculation (min) (ACUTE ONLY): 20 min  Charges:  $Gait Training: 8-22 mins                      Ramy Greth B. Remmi Armenteros, PT, DPT [id number]#[phone]   07/04/2015, 2:10 PM

## 2015-07-04 NOTE — Procedures (Signed)
Placed patient on CPAP for the night.  Patient is tolerating well at this time. 

## 2015-07-04 NOTE — Progress Notes (Signed)
PROGRESS NOTE  Nicole Maxwell:[id number] DOB: November 28, 1945 DOA: 06/30/2015 PCP: [name], MD   LOS: 3 days   Brief Narrative: 70 y.o. female with a Past Medical History of HTN, PVD, [place] abuse, esophageal dysmotility who presents with acute on chronic respiratory failure in the setting of COPD exacerbation.   Assessment & Plan: Principal Problem:   Acute on chronic respiratory failure (HCC) Active Problems:   Severe chronic obstructive pulmonary disease (HCC)   HTN (hypertension)   Hyperlipidemia   COPD exacerbation (HCC)   Diabetes mellitus type 2, noninsulin dependent (HCC)   Chronic diastolic congestive heart failure (HCC)   Leukocytosis   Anemia   CAP (community acquired pneumonia)   Acute encephalopathy   Tobacco use   Acute on chronic hypoxic and hypercarbic respiratory failure - related to COPD exacerbation.  - She is on oxygen at baseline and CPAP at night but is noncompliant with this. - unfortunately did not have CPAP last night, ordered today. - Continue scheduled nebs - Continue oxygen supplementation - continue Levaquin - slowly improving,taper steroids, change to po prednisone in am.   COPD exacerbation  - Recent hospitalization for COPD flare hypercarbic respiratory failure positive rhinovirus and left lower lobe pneumonia - continue steroids, nebulizer, antibiotics  Query community-acquired pneumonia  - Chest x-ray atelectasis with small effusion and possible infiltrate.  - clinically monitor, monitor cultures, continue Levaquin - cxr repeat on 6/8 shows stable , no pneumonia. Plan to complete the course of the antibiotic.   Diabetes non-insulin-dependent - Likely related to chronic use of steroids. Serum glucose on admission 122 - We'll monitor - Carb modified diet - Hold metformin - Sliding scale insulin CBG (last 3)   Recent Labs  07/03/15 2219 07/04/15 0821 07/04/15 1224  GLUCAP 141* 90 122*      Chronic diastolic heart failure    - Compensated. BNP 133. Echo done February 2017 shows EF of 55% rate 1 diastolic dysfunction mild LVH. Home medications include lasix, losartan - Continue home meds  Hypertension - good control. continue current medications  Acute encephalopathy - likely related to hypercarbia, improved today.   Tobacco use - Patient stopped smoking 1 month ago according to chart. Cessation support offered   Gram positive bacteremia: Vancomycin added, blood cultures showed staph coag negative in both bottles, taken from the same site probably a contaminant.  Repeat blood cultures ordered,pending btu negative so far   Leukocytosis probably from steroids. Repeat CBC in am shows improvement.    Anemia: Normocytic, monitor.    DVT prophylaxis: Lovenox Code Status: DNR Family Communication: no family bedside Disposition Plan: home 3-4 days  Consultants:   None   Procedures:   None   Antimicrobials:  Levaquin 6/5 >>   Vancomycin 6/6  Subjective: [place], and feel congested.   Objective: Filed Vitals:   07/04/15 0649 07/04/15 1404 07/04/15 1405 07/04/15 1540  BP:  126/51    Pulse:  99    Temp:  98.8 F (37.1 C)    TempSrc:      Resp:  18    Height:      Weight:      SpO2: 95% 100% 85% 98%    Intake/Output Summary (Last 24 hours) at 07/04/15 1643 Last data filed at 07/04/15 0812  Gross per 24 hour  Intake    450 ml  Output   1050 ml  Net   -600 ml   Filed Weights   07/01/15 0435 07/02/15 0259 07/04/15 0547  Weight:  55.4 kg (122 lb 2.2 oz) 52.073 kg (114 lb 12.8 oz) 53.7 kg (118 lb 6.2 oz)    Examination: Constitutional: NAD,  Filed Vitals:   07/04/15 0649 07/04/15 1404 07/04/15 1405 07/04/15 1540  BP:  126/51    Pulse:  99    Temp:  98.8 F (37.1 C)    TempSrc:      Resp:  18    Height:      Weight:      SpO2: 95% 100% 85% 98%   Eyes: PERRL, lids and conjunctivae normal ENMT: Mucous membranes are moist Respiratory no wheezing heard  today. Cardiovascular: Regular rate and rhythm, no murmurs / rubs / gallops.   Abdomen: no tenderness. Bowel sounds positive. Neurologic: non focal   Data Reviewed: I have personally reviewed following labs and imaging studies  CBC:  Recent Labs Lab 06/30/15 1450 07/01/15 0552 07/01/15 0847 07/02/15 0451 07/04/15 0628  WBC 17.5* 13.5*  --  20.5* 12.7*  NEUTROABS 14.3*  --   --   --   --   HGB 9.5* 8.7*  --  9.0* 9.5*  HCT 36.1 31.4* 30.1* 30.8* 33.2*  MCV 88.9 84.9  --  83.0 84.1  PLT 285 314  --  310 315   Basic Metabolic Panel:  Recent Labs Lab 06/30/15 1450 07/01/15 0552 07/02/15 0451  NA 143 138 138  K 4.2 4.7 4.5  CL 96* 93* 95*  CO2 37* 34* 36*  GLUCOSE 126* 150* 149*  BUN 6 14 22*  CREATININE 0.67 0.71 0.79  CALCIUM 9.6 9.5 8.9   GFR: Estimated Creatinine Clearance: 51.8 mL/min (by C-G formula based on Cr of 0.79). Liver Function Tests: No results for input(s): AST, ALT, ALKPHOS, BILITOT, PROT, ALBUMIN in the last 168 hours. No results for input(s): LIPASE, AMYLASE in the last 168 hours. No results for input(s): AMMONIA in the last 168 hours. Coagulation Profile: No results for input(s): INR, PROTIME in the last 168 hours. Cardiac Enzymes: No results for input(s): CKTOTAL, CKMB, CKMBINDEX, TROPONINI in the last 168 hours. BNP (last 3 results) No results for input(s): PROBNP in the last 8760 hours. HbA1C: No results for input(s): HGBA1C in the last 72 hours. CBG:  Recent Labs Lab 07/03/15 1210 07/03/15 1719 07/03/15 2219 07/04/15 0821 07/04/15 1224  GLUCAP 116* 115* 141* 90 122*   Lipid Profile: No results for input(s): CHOL, HDL, LDLCALC, TRIG, CHOLHDL, LDLDIRECT in the last 72 hours. Thyroid Function Tests: No results for input(s): TSH, T4TOTAL, FREET4, T3FREE, THYROIDAB in the last 72 hours. Anemia Panel: No results for input(s): VITAMINB12, FOLATE, FERRITIN, TIBC, IRON, RETICCTPCT in the last 72 hours. Urine analysis:    Component  Value Date/Time   COLORURINE YELLOW 03/15/2015 2336   APPEARANCEUR CLEAR 03/15/2015 2336   LABSPEC 1.020 03/15/2015 2336   PHURINE 5.5 03/15/2015 2336   GLUCOSEU NEGATIVE 03/15/2015 2336   HGBUR NEGATIVE 03/15/2015 2336   BILIRUBINUR NEGATIVE 03/15/2015 2336   KETONESUR NEGATIVE 03/15/2015 2336   PROTEINUR 30* 03/15/2015 2336   NITRITE NEGATIVE 03/15/2015 2336   LEUKOCYTESUR NEGATIVE 03/15/2015 2336   Sepsis Labs: Invalid input(s): PROCALCITONIN, LACTICIDVEN  Recent Results (from the past 240 hour(s))  Blood culture (routine x 2)     Status: Abnormal   Collection Time: 06/30/15  4:23 PM  Result Value Ref Range Status   Specimen Description BLOOD LEFT HAND  Final   Special Requests BOTTLES DRAWN AEROBIC AND ANAEROBIC 3CC  Final   Culture  Setup Time  Final    GRAM POSITIVE COCCI IN CLUSTERS AEROBIC BOTTLE ONLY Organism ID to follow CRITICAL RESULT CALLED TO, READ BACK BY AND VERIFIED WITH: A JOHNSON 07/01/15 @ 1535 M VESTAL    Culture STAPHYLOCOCCUS SPECIES (COAGULASE NEGATIVE) (A)  Final   Report Status 07/03/2015 FINAL  Final   Organism ID, Bacteria STAPHYLOCOCCUS SPECIES (COAGULASE NEGATIVE)  Final      Susceptibility   Staphylococcus species (coagulase negative) - MIC*    CIPROFLOXACIN 4 RESISTANT Resistant     ERYTHROMYCIN >=8 RESISTANT Resistant     GENTAMICIN <=0.5 SENSITIVE Sensitive     OXACILLIN <=0.25 RESISTANT Resistant     TETRACYCLINE 2 SENSITIVE Sensitive     VANCOMYCIN 2 SENSITIVE Sensitive     TRIMETH/SULFA 160 RESISTANT Resistant     CLINDAMYCIN <=0.25 SENSITIVE Sensitive     RIFAMPIN <=0.5 SENSITIVE Sensitive     Inducible Clindamycin NEGATIVE Sensitive     * STAPHYLOCOCCUS SPECIES (COAGULASE NEGATIVE)  Blood culture (routine x 2)     Status: Abnormal   Collection Time: 06/30/15  4:23 PM  Result Value Ref Range Status   Specimen Description BLOOD LEFT ANTECUBITAL  Final   Special Requests BOTTLES DRAWN AEROBIC AND ANAEROBIC 5CC  Final   Culture  Setup  Time   Final    GRAM POSITIVE COCCI IN CLUSTERS IN BOTH AEROBIC AND ANAEROBIC BOTTLES CRITICAL RESULT CALLED TO, READ BACK BY AND VERIFIED WITH: T JOHNSON 07/01/15 @ 1630 M VESTAL    Culture (A)  Final    STAPHYLOCOCCUS SPECIES (COAGULASE NEGATIVE) SUSCEPTIBILITIES PERFORMED ON PREVIOUS CULTURE WITHIN THE LAST 5 DAYS.    Report Status 07/03/2015 FINAL  Final  Blood Culture ID Panel (Reflexed)     Status: Abnormal   Collection Time: 06/30/15  4:23 PM  Result Value Ref Range Status   Enterococcus species NOT DETECTED NOT DETECTED Final   Vancomycin resistance NOT DETECTED NOT DETECTED Final   Listeria monocytogenes NOT DETECTED NOT DETECTED Final   Staphylococcus species DETECTED (A) NOT DETECTED Final    Comment: CRITICAL RESULT CALLED TO, READ BACK BY AND VERIFIED WITH: A JOHNSON 07/01/15 @ 1535 M VESTAL    Staphylococcus aureus NOT DETECTED NOT DETECTED Final   Methicillin resistance DETECTED (A) NOT DETECTED Final    Comment: CRITICAL RESULT CALLED TO, READ BACK BY AND VERIFIED WITH: A JOHNSON 07/01/15 @ 1535 M VESTAL    Streptococcus species NOT DETECTED NOT DETECTED Final   Streptococcus agalactiae NOT DETECTED NOT DETECTED Final   Streptococcus pneumoniae NOT DETECTED NOT DETECTED Final   Streptococcus pyogenes NOT DETECTED NOT DETECTED Final   Acinetobacter baumannii NOT DETECTED NOT DETECTED Final   Enterobacteriaceae species NOT DETECTED NOT DETECTED Final   Enterobacter cloacae complex NOT DETECTED NOT DETECTED Final   Escherichia coli NOT DETECTED NOT DETECTED Final   Klebsiella oxytoca NOT DETECTED NOT DETECTED Final   Klebsiella pneumoniae NOT DETECTED NOT DETECTED Final   Proteus species NOT DETECTED NOT DETECTED Final   Serratia marcescens NOT DETECTED NOT DETECTED Final   Carbapenem resistance NOT DETECTED NOT DETECTED Final   Haemophilus influenzae NOT DETECTED NOT DETECTED Final   Neisseria meningitidis NOT DETECTED NOT DETECTED Final   Pseudomonas aeruginosa NOT  DETECTED NOT DETECTED Final   Candida albicans NOT DETECTED NOT DETECTED Final   Candida glabrata NOT DETECTED NOT DETECTED Final   Candida krusei NOT DETECTED NOT DETECTED Final   Candida parapsilosis NOT DETECTED NOT DETECTED Final   Candida tropicalis NOT DETECTED NOT DETECTED  Final  Respiratory Panel by PCR     Status: None   Collection Time: 06/30/15  4:34 PM  Result Value Ref Range Status   Adenovirus NOT DETECTED NOT DETECTED Final   Coronavirus 229E NOT DETECTED NOT DETECTED Final   Coronavirus HKU1 NOT DETECTED NOT DETECTED Final   Coronavirus NL63 NOT DETECTED NOT DETECTED Final   Coronavirus OC43 NOT DETECTED NOT DETECTED Final   Metapneumovirus NOT DETECTED NOT DETECTED Final   Rhinovirus / Enterovirus NOT DETECTED NOT DETECTED Final   Influenza A NOT DETECTED NOT DETECTED Final   Influenza A H1 NOT DETECTED NOT DETECTED Final   Influenza A H1 2009 NOT DETECTED NOT DETECTED Final   Influenza A H3 NOT DETECTED NOT DETECTED Final   Influenza B NOT DETECTED NOT DETECTED Final   Parainfluenza Virus 1 NOT DETECTED NOT DETECTED Final   Parainfluenza Virus 2 NOT DETECTED NOT DETECTED Final   Parainfluenza Virus 3 NOT DETECTED NOT DETECTED Final   Parainfluenza Virus 4 NOT DETECTED NOT DETECTED Final   Respiratory Syncytial Virus NOT DETECTED NOT DETECTED Final   Bordetella pertussis NOT DETECTED NOT DETECTED Final   Chlamydophila pneumoniae NOT DETECTED NOT DETECTED Final   Mycoplasma pneumoniae NOT DETECTED NOT DETECTED Final  Culture, Urine     Status: Abnormal   Collection Time: 07/01/15  7:55 PM  Result Value Ref Range Status   Specimen Description URINE, CLEAN CATCH  Final   Special Requests NONE  Final   Culture MULTIPLE SPECIES PRESENT, SUGGEST RECOLLECTION (A)  Final   Report Status 07/03/2015 FINAL  Final  Culture, blood (Routine X 2) w Reflex to ID Panel     Status: None (Preliminary result)   Collection Time: 07/02/15 11:25 AM  Result Value Ref Range Status    Specimen Description BLOOD RIGHT ARM  Final   Special Requests BOTTLES DRAWN AEROBIC AND ANAEROBIC  6CC  Final   Culture NO GROWTH 2 DAYS  Final   Report Status PENDING  Incomplete  Culture, blood (Routine X 2) w Reflex to ID Panel     Status: None (Preliminary result)   Collection Time: 07/02/15 11:35 AM  Result Value Ref Range Status   Specimen Description BLOOD LEFT ARM  Final   Special Requests BOTTLES DRAWN AEROBIC AND ANAEROBIC  6CC  Final   Culture NO GROWTH 2 DAYS  Final   Report Status PENDING  Incomplete      Radiology Studies: Dg Chest 2 View  07/03/2015  CLINICAL DATA:  COPD EXAM: CHEST  2 VIEW COMPARISON:  06/30/2015 FINDINGS: Cardiac shadow is within normal limits. The lungs are hyperinflated bilaterally. Minimal left basilar atelectasis and small effusion is noted. These changes are stable from the recent exam. No new focal infiltrate or sizable pneumothorax is seen. No bony abnormality is noted. IMPRESSION: COPD with mild left basilar atelectasis and small left pleural effusion stable from the recent exam. Electronically Signed   By: Alcide CleverMark  Lukens M.D.   On: 07/03/2015 16:57     Scheduled Meds: . alprazolam  1 mg Oral QHS  . aspirin EC  81 mg Oral Daily  . atorvastatin  20 mg Oral Daily  . DULoxetine  60 mg Oral Daily  . enoxaparin (LOVENOX) injection  40 mg Subcutaneous QHS  . fluticasone  1 spray Each Nare BID  . furosemide  40 mg Oral [id number]  . guaiFENesin  600 mg Oral BID  . insulin aspart  0-9 Units Subcutaneous TID WC  . ipratropium-albuterol  3 mL Nebulization QID  . levofloxacin  500 mg Oral Daily  . losartan  50 mg Oral Daily  . methylPREDNISolone (SOLU-MEDROL) injection  60 mg Intravenous Q24H  . pantoprazole  40 mg Oral q morning - 10a  . potassium chloride SA  20 mEq Oral Daily  . sodium chloride flush  3 mL Intravenous Q12H  . vancomycin  750 mg Intravenous Q12H   Continuous Infusions:   Kathlen Mody,  MD,  [facility] Pager  [phone]  If 7PM-7AM, please contact night-coverage [email or web address] Password [facility] 07/04/2015, 4:43 PM

## 2015-07-05 LAB — GLUCOSE, CAPILLARY
GLUCOSE-CAPILLARY: 83 mg/dL (ref 65–99)
GLUCOSE-CAPILLARY: 105 mg/dL — AB (ref 65–99)
Glucose-Capillary: 217 mg/dL — ABNORMAL HIGH (ref 65–99)
Glucose-Capillary: 83 mg/dL (ref 65–99)

## 2015-07-05 MED ORDER — PREDNISONE 20 MG PO TABS
40.0000 mg | ORAL_TABLET | Freq: Every day | ORAL | Status: DC
  Administered 2015-07-06: 40 mg via ORAL
  Filled 2015-07-05: qty 2

## 2015-07-05 MED ORDER — POLYETHYLENE GLYCOL 3350 17 G PO PACK
17.0000 g | PACK | Freq: Every day | ORAL | Status: DC
  Administered 2015-07-05 – 2015-07-06 (×2): 17 g via ORAL
  Filled 2015-07-05 (×2): qty 1

## 2015-07-05 MED ORDER — SENNOSIDES-DOCUSATE SODIUM 8.6-50 MG PO TABS
2.0000 | ORAL_TABLET | Freq: Two times a day (BID) | ORAL | Status: DC
  Administered 2015-07-05 – 2015-07-06 (×2): 2 via ORAL
  Filled 2015-07-05 (×3): qty 2

## 2015-07-05 NOTE — Progress Notes (Signed)
PROGRESS NOTE  Nicole Maxwell ZOX:[id number] DOB: [birth year] DOA: 06/30/2015 PCP: [name], MD   LOS: 4 days   Brief Narrative: 70 y.o. female with a Past Medical History of HTN, PVD, Tobaco abuse, esophageal dysmotility who presents with acute on chronic respiratory failure in the setting of COPD exacerbation.   Assessment & Plan: Principal Problem:   Acute on chronic respiratory failure (HCC) Active Problems:   Severe chronic obstructive pulmonary disease (HCC)   HTN (hypertension)   Hyperlipidemia   COPD exacerbation (HCC)   Diabetes mellitus type 2, noninsulin dependent (HCC)   Chronic diastolic congestive heart failure (HCC)   Leukocytosis   Anemia   CAP (community acquired pneumonia)   Acute encephalopathy   Tobacco use   Acute on chronic hypoxic and hypercarbic respiratory failure - related to COPD exacerbation.  - She is on oxygen at baseline and CPAP at night but is noncompliant with this. - unfortunately did not have CPAP last night, ordered today. - Continue scheduled nebs - Continue oxygen supplementation - continue Levaquin - slowly improving,taper steroids, change to po prednisone in am.   COPD exacerbation  - Recent hospitalization for COPD flare hypercarbic respiratory failure positive rhinovirus and left lower lobe pneumonia - continue steroids, nebulizer, antibiotics  Query community-acquired pneumonia  - Chest x-ray atelectasis with small effusion and possible infiltrate.  - clinically monitor, monitor cultures, continue Levaquin - cxr repeat on 6/8 shows stable , no pneumonia. Plan to complete the course of the antibiotic.   Diabetes non-insulin-dependent - Likely related to chronic use of steroids. Serum glucose on admission 122 - We'll monitor - Carb modified diet - Hold metformin - Sliding scale insulin CBG (last 3)   Recent Labs  07/04/15 1710 07/05/15 0802 07/05/15 1213  GLUCAP 181* 83 105*      Chronic diastolic heart failure   - Compensated. BNP 133. Echo done February 2017 shows EF of 55% rate 1 diastolic dysfunction mild LVH. Home medications include lasix, losartan - Continue home meds  Hypertension - good control. continue current medications  Acute encephalopathy - likely related to hypercarbia, improved today.   Tobacco use - Patient stopped smoking 1 month ago according to chart. Cessation support offered   Gram positive bacteremia: Vancomycin added, blood cultures showed staph coag negative in both bottles, taken from the same site probably a contaminant. Since she came in with dyspnea, will get echocardiogram done.  Repeat blood cultures ordered,pending btu negative so far   Leukocytosis probably from steroids. Repeat CBC in am shows improvement.    Anemia: Normocytic, monitor.    DVT prophylaxis: Lovenox Code Status: DNR Family Communication: no family bedside Disposition Plan: home 3-4 days  Consultants:   None   Procedures:   None   Antimicrobials:  Levaquin 6/5 >>   Vancomycin 6/6  Subjective: [place], and feel congested.   Objective: Filed Vitals:   07/05/15 1047 07/05/15 1144 07/05/15 1407 07/05/15 1509  BP: 115/42  140/49   Pulse: 92  109   Temp:   98.3 F (36.8 C)   TempSrc:      Resp:   18   Height:      Weight:      SpO2:  96% 96% 97%    Intake/Output Summary (Last 24 hours) at 07/05/15 1717 Last data filed at 07/05/15 0944  Gross per 24 hour  Intake    240 ml  Output    300 ml  Net    -60 ml   Filed  Weights   07/01/15 0435 07/02/15 0259 07/04/15 0547  Weight: 55.4 kg (122 lb 2.2 oz) 52.073 kg (114 lb 12.8 oz) 53.7 kg (118 lb 6.2 oz)    Examination: Constitutional: NAD,  Filed Vitals:   07/05/15 1047 07/05/15 1144 07/05/15 1407 07/05/15 1509  BP: 115/42  140/49   Pulse: 92  109   Temp:   98.3 F (36.8 C)   TempSrc:      Resp:   18   Height:      Weight:      SpO2:  96% 96% 97%   Eyes: PERRL, lids and conjunctivae normal ENMT:  Mucous membranes are moist Respiratory no wheezing heard today. Cardiovascular: Regular rate and rhythm, no murmurs / rubs / gallops.   Abdomen: no tenderness. Bowel sounds positive. Neurologic: non focal   Data Reviewed: I have personally reviewed following labs and imaging studies  CBC:  Recent Labs Lab 06/30/15 1450 07/01/15 0552 07/01/15 0847 07/02/15 0451 07/04/15 0628  WBC 17.5* 13.5*  --  20.5* 12.7*  NEUTROABS 14.3*  --   --   --   --   HGB 9.5* 8.7*  --  9.0* 9.5*  HCT 36.1 31.4* 30.1* 30.8* 33.2*  MCV 88.9 84.9  --  83.0 84.1  PLT 285 314  --  310 315   Basic Metabolic Panel:  Recent Labs Lab 06/30/15 1450 07/01/15 0552 07/02/15 0451  NA 143 138 138  K 4.2 4.7 4.5  CL 96* 93* 95*  CO2 37* 34* 36*  GLUCOSE 126* 150* 149*  BUN 6 14 22*  CREATININE 0.67 0.71 0.79  CALCIUM 9.6 9.5 8.9   GFR: Estimated Creatinine Clearance: 51.8 mL/min (by C-G formula based on Cr of 0.79). Liver Function Tests: No results for input(s): AST, ALT, ALKPHOS, BILITOT, PROT, ALBUMIN in the last 168 hours. No results for input(s): LIPASE, AMYLASE in the last 168 hours. No results for input(s): AMMONIA in the last 168 hours. Coagulation Profile: No results for input(s): INR, PROTIME in the last 168 hours. Cardiac Enzymes: No results for input(s): CKTOTAL, CKMB, CKMBINDEX, TROPONINI in the last 168 hours. BNP (last 3 results) No results for input(s): PROBNP in the last 8760 hours. HbA1C: No results for input(s): HGBA1C in the last 72 hours. CBG:  Recent Labs Lab 07/04/15 0821 07/04/15 1224 07/04/15 1710 07/05/15 0802 07/05/15 1213  GLUCAP 90 122* 181* 83 105*   Lipid Profile: No results for input(s): CHOL, HDL, LDLCALC, TRIG, CHOLHDL, LDLDIRECT in the last 72 hours. Thyroid Function Tests: No results for input(s): TSH, T4TOTAL, FREET4, T3FREE, THYROIDAB in the last 72 hours. Anemia Panel: No results for input(s): VITAMINB12, FOLATE, FERRITIN, TIBC, IRON, RETICCTPCT in  the last 72 hours. Urine analysis:    Component Value Date/Time   COLORURINE YELLOW 03/15/2015 2336   APPEARANCEUR CLEAR 03/15/2015 2336   LABSPEC 1.020 03/15/2015 2336   PHURINE 5.5 03/15/2015 2336   GLUCOSEU NEGATIVE 03/15/2015 2336   HGBUR NEGATIVE 03/15/2015 2336   BILIRUBINUR NEGATIVE 03/15/2015 2336   KETONESUR NEGATIVE 03/15/2015 2336   PROTEINUR 30* 03/15/2015 2336   NITRITE NEGATIVE 03/15/2015 2336   LEUKOCYTESUR NEGATIVE 03/15/2015 2336   Sepsis Labs: Invalid input(s): PROCALCITONIN, LACTICIDVEN  Recent Results (from the past 240 hour(s))  Blood culture (routine x 2)     Status: Abnormal   Collection Time: 06/30/15  4:23 PM  Result Value Ref Range Status   Specimen Description BLOOD LEFT HAND  Final   Special Requests BOTTLES DRAWN AEROBIC AND ANAEROBIC  3CC  Final   Culture  Setup Time   Final    GRAM POSITIVE COCCI IN CLUSTERS AEROBIC BOTTLE ONLY Organism ID to follow CRITICAL RESULT CALLED TO, READ BACK BY AND VERIFIED WITH: A JOHNSON 07/01/15 @ 1535 M VESTAL    Culture STAPHYLOCOCCUS SPECIES (COAGULASE NEGATIVE) (A)  Final   Report Status 07/03/2015 FINAL  Final   Organism ID, Bacteria STAPHYLOCOCCUS SPECIES (COAGULASE NEGATIVE)  Final      Susceptibility   Staphylococcus species (coagulase negative) - MIC*    CIPROFLOXACIN 4 RESISTANT Resistant     ERYTHROMYCIN >=8 RESISTANT Resistant     GENTAMICIN <=0.5 SENSITIVE Sensitive     OXACILLIN <=0.25 RESISTANT Resistant     TETRACYCLINE 2 SENSITIVE Sensitive     VANCOMYCIN 2 SENSITIVE Sensitive     TRIMETH/SULFA 160 RESISTANT Resistant     CLINDAMYCIN <=0.25 SENSITIVE Sensitive     RIFAMPIN <=0.5 SENSITIVE Sensitive     Inducible Clindamycin NEGATIVE Sensitive     * STAPHYLOCOCCUS SPECIES (COAGULASE NEGATIVE)  Blood culture (routine x 2)     Status: Abnormal   Collection Time: 06/30/15  4:23 PM  Result Value Ref Range Status   Specimen Description BLOOD LEFT ANTECUBITAL  Final   Special Requests BOTTLES DRAWN  AEROBIC AND ANAEROBIC 5CC  Final   Culture  Setup Time   Final    GRAM POSITIVE COCCI IN CLUSTERS IN BOTH AEROBIC AND ANAEROBIC BOTTLES CRITICAL RESULT CALLED TO, READ BACK BY AND VERIFIED WITH: T JOHNSON 07/01/15 @ 1630 M VESTAL    Culture (A)  Final    STAPHYLOCOCCUS SPECIES (COAGULASE NEGATIVE) SUSCEPTIBILITIES PERFORMED ON PREVIOUS CULTURE WITHIN THE LAST 5 DAYS.    Report Status 07/03/2015 FINAL  Final  Blood Culture ID Panel (Reflexed)     Status: Abnormal   Collection Time: 06/30/15  4:23 PM  Result Value Ref Range Status   Enterococcus species NOT DETECTED NOT DETECTED Final   Vancomycin resistance NOT DETECTED NOT DETECTED Final   Listeria monocytogenes NOT DETECTED NOT DETECTED Final   Staphylococcus species DETECTED (A) NOT DETECTED Final    Comment: CRITICAL RESULT CALLED TO, READ BACK BY AND VERIFIED WITH: A JOHNSON 07/01/15 @ 1535 M VESTAL    Staphylococcus aureus NOT DETECTED NOT DETECTED Final   Methicillin resistance DETECTED (A) NOT DETECTED Final    Comment: CRITICAL RESULT CALLED TO, READ BACK BY AND VERIFIED WITH: A JOHNSON 07/01/15 @ 1535 M VESTAL    Streptococcus species NOT DETECTED NOT DETECTED Final   Streptococcus agalactiae NOT DETECTED NOT DETECTED Final   Streptococcus pneumoniae NOT DETECTED NOT DETECTED Final   Streptococcus pyogenes NOT DETECTED NOT DETECTED Final   Acinetobacter baumannii NOT DETECTED NOT DETECTED Final   Enterobacteriaceae species NOT DETECTED NOT DETECTED Final   Enterobacter cloacae complex NOT DETECTED NOT DETECTED Final   Escherichia coli NOT DETECTED NOT DETECTED Final   Klebsiella oxytoca NOT DETECTED NOT DETECTED Final   Klebsiella pneumoniae NOT DETECTED NOT DETECTED Final   Proteus species NOT DETECTED NOT DETECTED Final   Serratia marcescens NOT DETECTED NOT DETECTED Final   Carbapenem resistance NOT DETECTED NOT DETECTED Final   Haemophilus influenzae NOT DETECTED NOT DETECTED Final   Neisseria meningitidis NOT DETECTED  NOT DETECTED Final   Pseudomonas aeruginosa NOT DETECTED NOT DETECTED Final   Candida albicans NOT DETECTED NOT DETECTED Final   Candida glabrata NOT DETECTED NOT DETECTED Final   Candida krusei NOT DETECTED NOT DETECTED Final   Candida parapsilosis NOT DETECTED  NOT DETECTED Final   Candida tropicalis NOT DETECTED NOT DETECTED Final  Respiratory Panel by PCR     Status: None   Collection Time: 06/30/15  4:34 PM  Result Value Ref Range Status   Adenovirus NOT DETECTED NOT DETECTED Final   Coronavirus 229E NOT DETECTED NOT DETECTED Final   Coronavirus HKU1 NOT DETECTED NOT DETECTED Final   Coronavirus NL63 NOT DETECTED NOT DETECTED Final   Coronavirus OC43 NOT DETECTED NOT DETECTED Final   Metapneumovirus NOT DETECTED NOT DETECTED Final   Rhinovirus / Enterovirus NOT DETECTED NOT DETECTED Final   Influenza A NOT DETECTED NOT DETECTED Final   Influenza A H1 NOT DETECTED NOT DETECTED Final   Influenza A H1 2009 NOT DETECTED NOT DETECTED Final   Influenza A H3 NOT DETECTED NOT DETECTED Final   Influenza B NOT DETECTED NOT DETECTED Final   Parainfluenza Virus 1 NOT DETECTED NOT DETECTED Final   Parainfluenza Virus 2 NOT DETECTED NOT DETECTED Final   Parainfluenza Virus 3 NOT DETECTED NOT DETECTED Final   Parainfluenza Virus 4 NOT DETECTED NOT DETECTED Final   Respiratory Syncytial Virus NOT DETECTED NOT DETECTED Final   Bordetella pertussis NOT DETECTED NOT DETECTED Final   Chlamydophila pneumoniae NOT DETECTED NOT DETECTED Final   Mycoplasma pneumoniae NOT DETECTED NOT DETECTED Final  Culture, Urine     Status: Abnormal   Collection Time: 07/01/15  7:55 PM  Result Value Ref Range Status   Specimen Description URINE, CLEAN CATCH  Final   Special Requests NONE  Final   Culture MULTIPLE SPECIES PRESENT, SUGGEST RECOLLECTION (A)  Final   Report Status 07/03/2015 FINAL  Final  Culture, blood (Routine X 2) w Reflex to ID Panel     Status: None (Preliminary result)   Collection Time:  07/02/15 11:25 AM  Result Value Ref Range Status   Specimen Description BLOOD RIGHT ARM  Final   Special Requests BOTTLES DRAWN AEROBIC AND ANAEROBIC  6CC  Final   Culture NO GROWTH 3 DAYS  Final   Report Status PENDING  Incomplete  Culture, blood (Routine X 2) w Reflex to ID Panel     Status: None (Preliminary result)   Collection Time: 07/02/15 11:35 AM  Result Value Ref Range Status   Specimen Description BLOOD LEFT ARM  Final   Special Requests BOTTLES DRAWN AEROBIC AND ANAEROBIC  6CC  Final   Culture NO GROWTH 3 DAYS  Final   Report Status PENDING  Incomplete      Radiology Studies: No results found.   Scheduled Meds: . alprazolam  1 mg Oral QHS  . aspirin EC  81 mg Oral Daily  . atorvastatin  20 mg Oral Daily  . DULoxetine  60 mg Oral Daily  . enoxaparin (LOVENOX) injection  40 mg Subcutaneous QHS  . fluticasone  1 spray Each Nare BID  . furosemide  40 mg Oral [id number]  . guaiFENesin  600 mg Oral BID  . insulin aspart  0-9 Units Subcutaneous TID WC  . ipratropium-albuterol  3 mL Nebulization QID  . levofloxacin  500 mg Oral Daily  . loratadine  10 mg Oral Daily  . losartan  50 mg Oral Daily  . methylPREDNISolone (SOLU-MEDROL) injection  60 mg Intravenous Q24H  . pantoprazole  40 mg Oral q morning - 10a  . polyethylene glycol  17 g Oral Daily  . potassium chloride SA  20 mEq Oral Daily  . senna-docusate  2 tablet Oral BID  . sodium chloride flush  3 mL Intravenous Q12H   Continuous Infusions:   Kathlen ModyVijaya Akiva Josey,  MD,  [facility] Pager [phone]  If 7PM-7AM, please contact night-coverage [email or web address] Password [email or web address] 07/05/2015, 5:17 PM

## 2015-07-05 NOTE — Progress Notes (Signed)
Patient comfortable on [place] at this time, Wants to rest and CPAP leaked all in her eye last night. Will try later if changes, but not wanting at this time.

## 2015-07-06 ENCOUNTER — Inpatient Hospital Stay (HOSPITAL_COMMUNITY): Payer: Medicare Other

## 2015-07-06 DIAGNOSIS — R0600 Dyspnea, unspecified: Secondary | ICD-10-CM

## 2015-07-06 LAB — ECHOCARDIOGRAM COMPLETE
AO mean calculated velocity dopler: 96.5 cm/s
AOPV: 0.69 m/s
AOVTI: 25.5 cm
AV Area VTI index: 1.32 cm2/m2
AV Area VTI: 1.76 cm2
AV Area mean vel: 1.8 cm2
AV VEL mean LVOT/AV: 0.71
AV peak Index: 1.17
AV pk vel: 135 cm/s
AVAREAMEANVIN: 1.19 cm2/m2
AVG: 4 mmHg
AVPG: 7 mmHg
CHL CUP AV VEL: 2
CHL CUP DOP CALC LVOT VTI: 20.1 cm
CHL CUP STROKE VOLUME: 43 mL
E decel time: 169 msec
E/e' ratio: 7.56
FS: 20 % — AB (ref 28–44)
Height: 62 in
IVS/LV PW RATIO, ED: 0.98
LA ID, A-P, ES: 27 mm
LA vol: 34.2 mL
LADIAMINDEX: 1.79 cm/m2
LAVOLA4C: 29.6 mL
LAVOLIN: 22.6 mL/m2
LEFT ATRIUM END SYS DIAM: 27 mm
LV E/e'average: 7.56
LV SIMPSON'S DISK: 54
LV TDI E'LATERAL: 10.4
LV dias vol index: 53 mL/m2
LV sys vol: 37 mL (ref 14–42)
LVDIAVOL: 80 mL (ref 46–106)
LVEEMED: 7.56
LVELAT: 10.4 cm/s
LVOT SV: 51 mL
LVOT area: 2.54 cm2
LVOT diameter: 18 mm
LVOT peak VTI: 0.79 cm
LVOT peak vel: 93.8 cm/s
LVSYSVOLIN: 24 mL/m2
MV Dec: 169
MV Peak grad: 2 mmHg
MV pk A vel: 98.5 m/s
MVPKEVEL: 78.6 m/s
PW: 8.59 mm — AB (ref 0.6–1.1)
RV TAPSE: 15.7 mm
TDI e' medial: 6.96
Valve area index: 1.32
Valve area: 2 cm2
Weight: 1849.6 oz

## 2015-07-06 LAB — GLUCOSE, CAPILLARY
Glucose-Capillary: 89 mg/dL (ref 65–99)
Glucose-Capillary: 112 mg/dL — ABNORMAL HIGH (ref 65–99)

## 2015-07-06 MED ORDER — LEVOFLOXACIN 500 MG PO TABS: 500.0000 mg | ORAL_TABLET | Freq: Every day | ORAL | Status: DC

## 2015-07-06 MED ORDER — PREDNISONE 20 MG PO TABS: ORAL_TABLET | ORAL | Status: DC

## 2015-07-06 MED ORDER — PREDNISONE 5 MG PO TABS: 10.0000 mg | ORAL_TABLET | Freq: Every day | ORAL | Status: DC

## 2015-07-06 NOTE — Progress Notes (Signed)
Nicole Maxwell discharged Home per MD order.  Discharge instructions reviewed and discussed with the patient, all questions and concerns answered. Copy of instructions, care notes for new medications & diagnosis and scripts given to patient.    Medication List    TAKE these medications        acetaminophen 500 MG tablet  Commonly known as:  TYLENOL  Take 500-1,000 mg by mouth every 8 (eight) hours as needed (for pain).     albuterol 108 (90 Base) MCG/ACT inhaler  Commonly known as:  PROVENTIL HFA;VENTOLIN HFA  Inhale 2 puffs into the lungs every 4 (four) hours as needed for wheezing or shortness of breath.     albuterol (2.5 MG/3ML) 0.083% nebulizer solution  Commonly known as:  PROVENTIL  Take 3 mLs (2.5 mg total) by nebulization every 4 (four) hours as needed for wheezing or shortness of breath. (may take 1 every 4 hours as needed for wheezing/shortness of breath) DX: 496     alprazolam 2 MG tablet  Commonly known as:  XANAX  Take 1-2 mg by mouth at bedtime.     aspirin 81 MG EC tablet  Take 1 tablet (81 mg total) by mouth daily.     atorvastatin 20 MG tablet  Commonly known as:  LIPITOR  Take 20 mg by mouth daily. evening     DULoxetine 60 MG capsule  Commonly known as:  CYMBALTA  Take 60 mg by mouth daily.     fluticasone 50 MCG/ACT nasal spray  Commonly known as:  FLONASE  Place 1 spray into both nostrils 2 (two) times daily.     furosemide 40 MG tablet  Commonly known as:  LASIX  Take 40 mg by mouth every morning.     HYDROcodone-acetaminophen 5-325 MG tablet  Commonly known as:  NORCO/VICODIN  Take 1 tablet by mouth every 6 (six) hours as needed for moderate pain.     ipratropium-albuterol 0.5-2.5 (3) MG/3ML Soln  Commonly known as:  DUONEB  INHALE 1 VAIL VIA NEBULIZER FOUR TIMES DAILY     levofloxacin 500 MG tablet  Commonly known as:  LEVAQUIN  Take 1 tablet (500 mg total) by mouth daily.  Start taking on:  07/07/2015     losartan 50 MG tablet  Commonly  known as:  COZAAR  Take 50 mg by mouth daily.     metFORMIN 500 MG tablet  Commonly known as:  GLUCOPHAGE  TAKE 1 TABLET BY MOUTH TWICE DAILY     multivitamin tablet  Take 1 tablet by mouth daily.     ONE TOUCH ULTRA TEST test strip  Generic drug:  glucose blood  As directed     pantoprazole 40 MG tablet  Commonly known as:  PROTONIX  TAKE 1 TABLET BY MOUTH EVERY NIGHT     potassium chloride SA 20 MEQ tablet  Commonly known as:  K-DUR,KLOR-CON  Take 1 tablet (20 mEq total) by mouth daily. Take while on lasix     predniSONE 20 MG tablet  Commonly known as:  DELTASONE  Prednisone 40 mg daily for 2 days followed by  Prednisone 20 mg daily for 3 days followed by Prednisone 10 mg daily as per your maintenance dose.     predniSONE 5 MG tablet  Commonly known as:  DELTASONE  Take 2 tablets (10 mg total) by mouth daily with breakfast.  Start taking on:  07/12/2015     PROCEL 100 Powd  Take 2 scoop by mouth 2 (two)  times daily.     SYMBICORT 160-4.5 MCG/ACT inhaler  Generic drug:  budesonide-formoterol  Inhale 2 puffs into the lungs 2 (two) times daily.     TUSSIONEX PENNKINETIC ER PO  Take 5 mLs by mouth 2 (two) times daily as needed (cough).        Patients skin is clean, dry and intact, no evidence of skin break down. IV site discontinued and catheter remains intact. Site without signs and symptoms of complications. Dressing and pressure applied.  Patient escorted to car by NT in a wheelchair,  no distress noted upon discharge.  Nicole Maxwell, Nicole Maxwell 07/06/2015 5:14 PM

## 2015-07-06 NOTE — Progress Notes (Signed)
*  PRELIMINARY RESULTS* Echocardiogram 2D Echocardiogram has been performed.  Doristine SectionKristy H Lasean Rahming 07/06/2015, 11:13 AM

## 2015-07-07 LAB — CULTURE, BLOOD (ROUTINE X 2)
Culture: NO GROWTH
Culture: NO GROWTH

## 2015-07-07 LAB — GLUCOSE, CAPILLARY: Glucose-Capillary: 104 mg/dL — ABNORMAL HIGH (ref 65–99)

## 2015-07-07 NOTE — Discharge Summary (Addendum)
Physician Discharge Summary  Nicole Maxwell NWG:[id number] DOB: [birth year] DOA: 06/30/2015  PCP: [name], MD  Admit date: 06/30/2015 Discharge date: 07/07/2015  Admitted From: home  Disposition:  Home   Recommendations for Outpatient Follow-up:  1. Follow up with PCP in 1-2 weeks 2. Please obtain BMP/CBC in one week 3. Please follow up with Dr Marchelle Gearing.  Home Health:yes Equipment/Devices:oxygen.   Discharge Condition:guarded.  CODE STATUS:full  Diet recommendation: Heart Healthy   Brief/Interim Summary: 70 y.o. female with a Past Medical History of HTN, PVD, Tobaco abuse, esophageal dysmotility who presents with acute on chronic respiratory failure in the setting of COPD exacerbation.  Discharge Diagnoses:  Principal Problem:   Acute on chronic respiratory failure (HCC) Active Problems:   Severe chronic obstructive pulmonary disease (HCC)   HTN (hypertension)   Hyperlipidemia   COPD exacerbation (HCC)   Diabetes mellitus type 2, noninsulin dependent (HCC)   Chronic diastolic congestive heart failure (HCC)   Leukocytosis   Anemia   CAP (community acquired pneumonia)   Acute encephalopathy   Tobacco use  Acute on chronic hypoxic and hypercarbic respiratory failure - related to COPD exacerbation.  - She is on oxygen at baseline and CPAP at night but is noncompliant with this. - Continue scheduled nebs, oxygen supplementation and  Levaquin - slowly improving,taper steroids slowly on discharge.   COPD exacerbation  - Recent hospitalization for COPD flare hypercarbic respiratory failure positive rhinovirus and left lower lobe pneumonia - continue steroids, nebulizer, antibiotics on discharge.    community-acquired pneumonia present on admission, resolved. - Chest x-ray atelectasis with small effusion and possible infiltrate.  - cxr repeat on 6/8 shows stable , no pneumonia. Plan to complete the course of the antibiotic.   Diabetes non-insulin-dependent - Likely  related to chronic use of steroids. Serum glucose on admission 122 Resume home meds.  CBG (last 3)   Recent Labs (last 2 labs)      Recent Labs  07/04/15 1710 07/05/15 0802 07/05/15 1213  GLUCAP 181* 83 105*        Chronic diastolic heart failure  - Compensated. BNP 133. Echo done February 2017 shows EF of 55% rate 1 diastolic dysfunction mild LVH. Home medications include lasix, losartan - Continue home meds  Hypertension - good control. continue current medications  Acute encephalopathy - likely related to hypercarbia, improved today.   Tobacco use - Patient stopped smoking 1 month ago according to chart. Cessation support offered   Gram positive bacteremia: Vancomycin added, blood cultures showed staph coag negative in both bottles, taken from the same site probably a contaminant. Since she came in with dyspnea, will get echocardiogram done, which did nto show any endocarditis.  Repeat blood cultures ordered, negative.    Leukocytosis probably from steroids. Repeat CBC in am shows improvement.   Anemia: Normocytic, monitor.         Discharge Instructions  Discharge Instructions    Diet - low sodium heart healthy    Complete by:  As directed      Discharge instructions    Complete by:  As directed   Please follow up with Dr Marchelle Gearing in one week.  Advice smoking cessation.     Increase activity slowly    Complete by:  As directed             Medication List    TAKE these medications        acetaminophen 500 MG tablet  Commonly known as:  TYLENOL  Take 500-1,000  mg by mouth every 8 (eight) hours as needed (for pain).     albuterol 108 (90 Base) MCG/ACT inhaler  Commonly known as:  PROVENTIL HFA;VENTOLIN HFA  Inhale 2 puffs into the lungs every 4 (four) hours as needed for wheezing or shortness of breath.     albuterol (2.5 MG/3ML) 0.083% nebulizer solution  Commonly known as:  PROVENTIL  Take 3 mLs (2.5 mg total) by nebulization  every 4 (four) hours as needed for wheezing or shortness of breath. (may take 1 every 4 hours as needed for wheezing/shortness of breath) DX: 496     alprazolam 2 MG tablet  Commonly known as:  XANAX  Take 1-2 mg by mouth at bedtime.     aspirin 81 MG EC tablet  Take 1 tablet (81 mg total) by mouth daily.     atorvastatin 20 MG tablet  Commonly known as:  LIPITOR  Take 20 mg by mouth daily. evening     DULoxetine 60 MG capsule  Commonly known as:  CYMBALTA  Take 60 mg by mouth daily.     fluticasone 50 MCG/ACT nasal spray  Commonly known as:  FLONASE  Place 1 spray into both nostrils 2 (two) times daily.     furosemide 40 MG tablet  Commonly known as:  LASIX  Take 40 mg by mouth every morning.     HYDROcodone-acetaminophen 5-325 MG tablet  Commonly known as:  NORCO/VICODIN  Take 1 tablet by mouth every 6 (six) hours as needed for moderate pain.     ipratropium-albuterol 0.5-2.5 (3) MG/3ML Soln  Commonly known as:  DUONEB  INHALE 1 VAIL VIA NEBULIZER FOUR TIMES DAILY     levofloxacin 500 MG tablet  Commonly known as:  LEVAQUIN  Take 1 tablet (500 mg total) by mouth daily.     losartan 50 MG tablet  Commonly known as:  COZAAR  Take 50 mg by mouth daily.     metFORMIN 500 MG tablet  Commonly known as:  GLUCOPHAGE  TAKE 1 TABLET BY MOUTH TWICE DAILY     multivitamin tablet  Take 1 tablet by mouth daily.     ONE TOUCH ULTRA TEST test strip  Generic drug:  glucose blood  As directed     pantoprazole 40 MG tablet  Commonly known as:  PROTONIX  TAKE 1 TABLET BY MOUTH EVERY NIGHT     potassium chloride SA 20 MEQ tablet  Commonly known as:  K-DUR,KLOR-CON  Take 1 tablet (20 mEq total) by mouth daily. Take while on lasix     predniSONE 20 MG tablet  Commonly known as:  DELTASONE  Prednisone 40 mg daily for 2 days followed by  Prednisone 20 mg daily for 3 days followed by Prednisone 10 mg daily as per your maintenance dose.     predniSONE 5 MG tablet  Commonly  known as:  DELTASONE  Take 2 tablets (10 mg total) by mouth daily with breakfast.  Start taking on:  07/12/2015     PROCEL 100 Powd  Take 2 scoop by mouth 2 (two) times daily.     SYMBICORT 160-4.5 MCG/ACT inhaler  Generic drug:  budesonide-formoterol  Inhale 2 puffs into the lungs 2 (two) times daily.     TUSSIONEX PENNKINETIC ER PO  Take 5 mLs by mouth 2 (two) times daily as needed (cough).           Follow-up Information    Follow up with [name], William, MD. Schedule an appointment as soon  as possible for a visit in 1 week.   Specialty:  Internal Medicine   Contact information:   [address] [place] [address] [phone]       Follow up with [facility], MD. Schedule an appointment as soon as possible for a visit in 1 week.   Specialty:  Pulmonary Disease   Contact information:   [address] [place] [address] [phone]      Allergies  Allergen Reactions  . Brovana [Arformoterol Tartrate] Shortness Of Breath and Cough    General intolerance  . Budesonide Shortness Of Breath and Cough    General intolerance  . Mucomyst [Acetylcysteine] Shortness Of Breath    Consultations: none  Procedures/Studies: Dg Chest 2 View  07/03/2015  CLINICAL DATA:  COPD EXAM: CHEST  2 VIEW COMPARISON:  06/30/2015 FINDINGS: Cardiac shadow is within normal limits. The lungs are hyperinflated bilaterally. Minimal left basilar atelectasis and small effusion is noted. These changes are stable from the recent exam. No new focal infiltrate or sizable pneumothorax is seen. No bony abnormality is noted. IMPRESSION: COPD with mild left basilar atelectasis and small left pleural effusion stable from the recent exam. Electronically Signed   By: Alcide Clever M.D.   On: 07/03/2015 16:57   Dg Chest Port 1 View  06/30/2015  CLINICAL DATA:  Shortness of breath EXAM: PORTABLE CHEST 1 VIEW COMPARISON:  04/15/2015 FINDINGS: Left base atelectasis or infiltrate. Small left pole or  effusion. Right lung is clear. Heart is normal size. No acute bony abnormality. IMPRESSION: Left base atelectasis or infiltrate with small left effusion. Electronically Signed   By: Charlett Nose M.D.   On: 06/30/2015 15:01       Subjective: Is cheerful.   Discharge Exam: Filed Vitals:   07/06/15 0931 07/06/15 1406  BP: 119/41 121/47  Pulse: 86 101  Temp:  98.6 F (37 C)  Resp:  17   Filed Vitals:   07/06/15 0814 07/06/15 0931 07/06/15 1213 07/06/15 1406  BP:  119/41  121/47  Pulse:  86  101  Temp:    98.6 F (37 C)  TempSrc:      Resp:    17  Height:      Weight:      SpO2: 98%  98% 97%    General: Pt is alert, awake, not in acute distress Cardiovascular: RRR, S1/S2 +, no rubs, no gallops Respiratory: CTA bilaterally, no wheezing, no rhonchi Abdominal: Soft, NT, ND, bowel sounds + Extremities: no edema, no cyanosis    The results of significant diagnostics from this hospitalization (including imaging, microbiology, ancillary and laboratory) are listed below for reference.     Microbiology: Recent Results (from the past 240 hour(s))  Blood culture (routine x 2)     Status: Abnormal   Collection Time: 06/30/15  4:23 PM  Result Value Ref Range Status   Specimen Description BLOOD LEFT HAND  Final   Special Requests BOTTLES DRAWN AEROBIC AND ANAEROBIC 3CC  Final   Culture  Setup Time   Final    GRAM POSITIVE COCCI IN CLUSTERS AEROBIC BOTTLE ONLY Organism ID to follow CRITICAL RESULT CALLED TO, READ BACK BY AND VERIFIED WITH: A JOHNSON 07/01/15 @ 1535 M VESTAL    Culture STAPHYLOCOCCUS SPECIES (COAGULASE NEGATIVE) (A)  Final   Report Status 07/03/2015 FINAL  Final   Organism ID, Bacteria STAPHYLOCOCCUS SPECIES (COAGULASE NEGATIVE)  Final      Susceptibility   Staphylococcus species (coagulase negative) - MIC*  CIPROFLOXACIN 4 RESISTANT Resistant     ERYTHROMYCIN >=8 RESISTANT Resistant     GENTAMICIN <=0.5 SENSITIVE Sensitive     OXACILLIN <=0.25 RESISTANT  Resistant     TETRACYCLINE 2 SENSITIVE Sensitive     VANCOMYCIN 2 SENSITIVE Sensitive     TRIMETH/SULFA 160 RESISTANT Resistant     CLINDAMYCIN <=0.25 SENSITIVE Sensitive     RIFAMPIN <=0.5 SENSITIVE Sensitive     Inducible Clindamycin NEGATIVE Sensitive     * STAPHYLOCOCCUS SPECIES (COAGULASE NEGATIVE)  Blood culture (routine x 2)     Status: Abnormal   Collection Time: 06/30/15  4:23 PM  Result Value Ref Range Status   Specimen Description BLOOD LEFT ANTECUBITAL  Final   Special Requests BOTTLES DRAWN AEROBIC AND ANAEROBIC 5CC  Final   Culture  Setup Time   Final    GRAM POSITIVE COCCI IN CLUSTERS IN BOTH AEROBIC AND ANAEROBIC BOTTLES CRITICAL RESULT CALLED TO, READ BACK BY AND VERIFIED WITH: T JOHNSON 07/01/15 @ 1630 M VESTAL    Culture (A)  Final    STAPHYLOCOCCUS SPECIES (COAGULASE NEGATIVE) SUSCEPTIBILITIES PERFORMED ON PREVIOUS CULTURE WITHIN THE LAST 5 DAYS.    Report Status 07/03/2015 FINAL  Final  Blood Culture ID Panel (Reflexed)     Status: Abnormal   Collection Time: 06/30/15  4:23 PM  Result Value Ref Range Status   Enterococcus species NOT DETECTED NOT DETECTED Final   Vancomycin resistance NOT DETECTED NOT DETECTED Final   Listeria monocytogenes NOT DETECTED NOT DETECTED Final   Staphylococcus species DETECTED (A) NOT DETECTED Final    Comment: CRITICAL RESULT CALLED TO, READ BACK BY AND VERIFIED WITH: A JOHNSON 07/01/15 @ 1535 M VESTAL    Staphylococcus aureus NOT DETECTED NOT DETECTED Final   Methicillin resistance DETECTED (A) NOT DETECTED Final    Comment: CRITICAL RESULT CALLED TO, READ BACK BY AND VERIFIED WITH: A JOHNSON 07/01/15 @ 1535 M VESTAL    Streptococcus species NOT DETECTED NOT DETECTED Final   Streptococcus agalactiae NOT DETECTED NOT DETECTED Final   Streptococcus pneumoniae NOT DETECTED NOT DETECTED Final   Streptococcus pyogenes NOT DETECTED NOT DETECTED Final   Acinetobacter baumannii NOT DETECTED NOT DETECTED Final   Enterobacteriaceae  species NOT DETECTED NOT DETECTED Final   Enterobacter cloacae complex NOT DETECTED NOT DETECTED Final   Escherichia coli NOT DETECTED NOT DETECTED Final   Klebsiella oxytoca NOT DETECTED NOT DETECTED Final   Klebsiella pneumoniae NOT DETECTED NOT DETECTED Final   Proteus species NOT DETECTED NOT DETECTED Final   Serratia marcescens NOT DETECTED NOT DETECTED Final   Carbapenem resistance NOT DETECTED NOT DETECTED Final   Haemophilus influenzae NOT DETECTED NOT DETECTED Final   Neisseria meningitidis NOT DETECTED NOT DETECTED Final   Pseudomonas aeruginosa NOT DETECTED NOT DETECTED Final   Candida albicans NOT DETECTED NOT DETECTED Final   Candida glabrata NOT DETECTED NOT DETECTED Final   Candida krusei NOT DETECTED NOT DETECTED Final   Candida parapsilosis NOT DETECTED NOT DETECTED Final   Candida tropicalis NOT DETECTED NOT DETECTED Final  Respiratory Panel by PCR     Status: None   Collection Time: 06/30/15  4:34 PM  Result Value Ref Range Status   Adenovirus NOT DETECTED NOT DETECTED Final   Coronavirus 229E NOT DETECTED NOT DETECTED Final   Coronavirus HKU1 NOT DETECTED NOT DETECTED Final   Coronavirus NL63 NOT DETECTED NOT DETECTED Final   Coronavirus OC43 NOT DETECTED NOT DETECTED Final   Metapneumovirus NOT DETECTED NOT DETECTED Final  Rhinovirus / Enterovirus NOT DETECTED NOT DETECTED Final   Influenza A NOT DETECTED NOT DETECTED Final   Influenza A H1 NOT DETECTED NOT DETECTED Final   Influenza A H1 2009 NOT DETECTED NOT DETECTED Final   Influenza A H3 NOT DETECTED NOT DETECTED Final   Influenza B NOT DETECTED NOT DETECTED Final   Parainfluenza Virus 1 NOT DETECTED NOT DETECTED Final   Parainfluenza Virus 2 NOT DETECTED NOT DETECTED Final   Parainfluenza Virus 3 NOT DETECTED NOT DETECTED Final   Parainfluenza Virus 4 NOT DETECTED NOT DETECTED Final   Respiratory Syncytial Virus NOT DETECTED NOT DETECTED Final   Bordetella pertussis NOT DETECTED NOT DETECTED Final    Chlamydophila pneumoniae NOT DETECTED NOT DETECTED Final   Mycoplasma pneumoniae NOT DETECTED NOT DETECTED Final  Culture, Urine     Status: Abnormal   Collection Time: 07/01/15  7:55 PM  Result Value Ref Range Status   Specimen Description URINE, CLEAN CATCH  Final   Special Requests NONE  Final   Culture MULTIPLE SPECIES PRESENT, SUGGEST RECOLLECTION (A)  Final   Report Status 07/03/2015 FINAL  Final  Culture, blood (Routine X 2) w Reflex to ID Panel     Status: None (Preliminary result)   Collection Time: 07/02/15 11:25 AM  Result Value Ref Range Status   Specimen Description BLOOD RIGHT ARM  Final   Special Requests BOTTLES DRAWN AEROBIC AND ANAEROBIC  6CC  Final   Culture NO GROWTH 4 DAYS  Final   Report Status PENDING  Incomplete  Culture, blood (Routine X 2) w Reflex to ID Panel     Status: None (Preliminary result)   Collection Time: 07/02/15 11:35 AM  Result Value Ref Range Status   Specimen Description BLOOD LEFT ARM  Final   Special Requests BOTTLES DRAWN AEROBIC AND ANAEROBIC  6CC  Final   Culture NO GROWTH 4 DAYS  Final   Report Status PENDING  Incomplete     Labs: BNP (last 3 results)  Recent Labs  11/27/14 1458 03/15/15 1430 06/30/15 1450  BNP 87.7 45.7 133.7*   Basic Metabolic Panel:  Recent Labs Lab 06/30/15 1450 07/01/15 0552 07/02/15 0451  NA 143 138 138  K 4.2 4.7 4.5  CL 96* 93* 95*  CO2 37* 34* 36*  GLUCOSE 126* 150* 149*  BUN 6 14 22*  CREATININE 0.67 0.71 0.79  CALCIUM 9.6 9.5 8.9   Liver Function Tests: No results for input(s): AST, ALT, ALKPHOS, BILITOT, PROT, ALBUMIN in the last 168 hours. No results for input(s): LIPASE, AMYLASE in the last 168 hours. No results for input(s): AMMONIA in the last 168 hours. CBC:  Recent Labs Lab 06/30/15 1450 07/01/15 0552 07/01/15 0847 07/02/15 0451 07/04/15 0628  WBC 17.5* 13.5*  --  20.5* 12.7*  NEUTROABS 14.3*  --   --   --   --   HGB 9.5* 8.7*  --  9.0* 9.5*  HCT 36.1 31.4* 30.1* 30.8*  33.2*  MCV 88.9 84.9  --  83.0 84.1  PLT 285 314  --  310 315   Cardiac Enzymes: No results for input(s): CKTOTAL, CKMB, CKMBINDEX, TROPONINI in the last 168 hours. BNP: Invalid input(s): POCBNP CBG:  Recent Labs Lab 07/05/15 1213 07/05/15 1736 07/05/15 2215 07/06/15 0741 07/06/15 1211  GLUCAP 105* 217* 83 89 112*   D-Dimer No results for input(s): DDIMER in the last 72 hours. Hgb A1c No results for input(s): HGBA1C in the last 72 hours. Lipid Profile No results for  input(s): CHOL, HDL, LDLCALC, TRIG, CHOLHDL, LDLDIRECT in the last 72 hours. Thyroid function studies No results for input(s): TSH, T4TOTAL, T3FREE, THYROIDAB in the last 72 hours.  Invalid input(s): FREET3 Anemia work up No results for input(s): VITAMINB12, FOLATE, FERRITIN, TIBC, IRON, RETICCTPCT in the last 72 hours. Urinalysis    Component Value Date/Time   COLORURINE YELLOW 03/15/2015 2336   APPEARANCEUR CLEAR 03/15/2015 2336   LABSPEC 1.020 03/15/2015 2336   PHURINE 5.5 03/15/2015 2336   GLUCOSEU NEGATIVE 03/15/2015 2336   HGBUR NEGATIVE 03/15/2015 2336   BILIRUBINUR NEGATIVE 03/15/2015 2336   KETONESUR NEGATIVE 03/15/2015 2336   PROTEINUR 30* 03/15/2015 2336   NITRITE NEGATIVE 03/15/2015 2336   LEUKOCYTESUR NEGATIVE 03/15/2015 2336   Sepsis Labs Invalid input(s): PROCALCITONIN,  WBC,  LACTICIDVEN Microbiology Recent Results (from the past 240 hour(s))  Blood culture (routine x 2)     Status: Abnormal   Collection Time: 06/30/15  4:23 PM  Result Value Ref Range Status   Specimen Description BLOOD LEFT HAND  Final   Special Requests BOTTLES DRAWN AEROBIC AND ANAEROBIC 3CC  Final   Culture  Setup Time   Final    GRAM POSITIVE COCCI IN CLUSTERS AEROBIC BOTTLE ONLY Organism ID to follow CRITICAL RESULT CALLED TO, READ BACK BY AND VERIFIED WITH: A JOHNSON 07/01/15 @ 1535 M VESTAL    Culture STAPHYLOCOCCUS SPECIES (COAGULASE NEGATIVE) (A)  Final   Report Status 07/03/2015 FINAL  Final    Organism ID, Bacteria STAPHYLOCOCCUS SPECIES (COAGULASE NEGATIVE)  Final      Susceptibility   Staphylococcus species (coagulase negative) - MIC*    CIPROFLOXACIN 4 RESISTANT Resistant     ERYTHROMYCIN >=8 RESISTANT Resistant     GENTAMICIN <=0.5 SENSITIVE Sensitive     OXACILLIN <=0.25 RESISTANT Resistant     TETRACYCLINE 2 SENSITIVE Sensitive     VANCOMYCIN 2 SENSITIVE Sensitive     TRIMETH/SULFA 160 RESISTANT Resistant     CLINDAMYCIN <=0.25 SENSITIVE Sensitive     RIFAMPIN <=0.5 SENSITIVE Sensitive     Inducible Clindamycin NEGATIVE Sensitive     * STAPHYLOCOCCUS SPECIES (COAGULASE NEGATIVE)  Blood culture (routine x 2)     Status: Abnormal   Collection Time: 06/30/15  4:23 PM  Result Value Ref Range Status   Specimen Description BLOOD LEFT ANTECUBITAL  Final   Special Requests BOTTLES DRAWN AEROBIC AND ANAEROBIC 5CC  Final   Culture  Setup Time   Final    GRAM POSITIVE COCCI IN CLUSTERS IN BOTH AEROBIC AND ANAEROBIC BOTTLES CRITICAL RESULT CALLED TO, READ BACK BY AND VERIFIED WITH: T JOHNSON 07/01/15 @ 1630 M VESTAL    Culture (A)  Final    STAPHYLOCOCCUS SPECIES (COAGULASE NEGATIVE) SUSCEPTIBILITIES PERFORMED ON PREVIOUS CULTURE WITHIN THE LAST 5 DAYS.    Report Status 07/03/2015 FINAL  Final  Blood Culture ID Panel (Reflexed)     Status: Abnormal   Collection Time: 06/30/15  4:23 PM  Result Value Ref Range Status   Enterococcus species NOT DETECTED NOT DETECTED Final   Vancomycin resistance NOT DETECTED NOT DETECTED Final   Listeria monocytogenes NOT DETECTED NOT DETECTED Final   Staphylococcus species DETECTED (A) NOT DETECTED Final    Comment: CRITICAL RESULT CALLED TO, READ BACK BY AND VERIFIED WITH: A JOHNSON 07/01/15 @ 1535 M VESTAL    Staphylococcus aureus NOT DETECTED NOT DETECTED Final   Methicillin resistance DETECTED (A) NOT DETECTED Final    Comment: CRITICAL RESULT CALLED TO, READ BACK BY AND VERIFIED WITH: A  JOHNSON 07/01/15 @ 1535 M VESTAL    Streptococcus  species NOT DETECTED NOT DETECTED Final   Streptococcus agalactiae NOT DETECTED NOT DETECTED Final   Streptococcus pneumoniae NOT DETECTED NOT DETECTED Final   Streptococcus pyogenes NOT DETECTED NOT DETECTED Final   Acinetobacter baumannii NOT DETECTED NOT DETECTED Final   Enterobacteriaceae species NOT DETECTED NOT DETECTED Final   Enterobacter cloacae complex NOT DETECTED NOT DETECTED Final   Escherichia coli NOT DETECTED NOT DETECTED Final   Klebsiella oxytoca NOT DETECTED NOT DETECTED Final   Klebsiella pneumoniae NOT DETECTED NOT DETECTED Final   Proteus species NOT DETECTED NOT DETECTED Final   Serratia marcescens NOT DETECTED NOT DETECTED Final   Carbapenem resistance NOT DETECTED NOT DETECTED Final   Haemophilus influenzae NOT DETECTED NOT DETECTED Final   Neisseria meningitidis NOT DETECTED NOT DETECTED Final   Pseudomonas aeruginosa NOT DETECTED NOT DETECTED Final   Candida albicans NOT DETECTED NOT DETECTED Final   Candida glabrata NOT DETECTED NOT DETECTED Final   Candida krusei NOT DETECTED NOT DETECTED Final   Candida parapsilosis NOT DETECTED NOT DETECTED Final   Candida tropicalis NOT DETECTED NOT DETECTED Final  Respiratory Panel by PCR     Status: None   Collection Time: 06/30/15  4:34 PM  Result Value Ref Range Status   Adenovirus NOT DETECTED NOT DETECTED Final   Coronavirus 229E NOT DETECTED NOT DETECTED Final   Coronavirus HKU1 NOT DETECTED NOT DETECTED Final   Coronavirus NL63 NOT DETECTED NOT DETECTED Final   Coronavirus OC43 NOT DETECTED NOT DETECTED Final   Metapneumovirus NOT DETECTED NOT DETECTED Final   Rhinovirus / Enterovirus NOT DETECTED NOT DETECTED Final   Influenza A NOT DETECTED NOT DETECTED Final   Influenza A H1 NOT DETECTED NOT DETECTED Final   Influenza A H1 2009 NOT DETECTED NOT DETECTED Final   Influenza A H3 NOT DETECTED NOT DETECTED Final   Influenza B NOT DETECTED NOT DETECTED Final   Parainfluenza Virus 1 NOT DETECTED NOT DETECTED  Final   Parainfluenza Virus 2 NOT DETECTED NOT DETECTED Final   Parainfluenza Virus 3 NOT DETECTED NOT DETECTED Final   Parainfluenza Virus 4 NOT DETECTED NOT DETECTED Final   Respiratory Syncytial Virus NOT DETECTED NOT DETECTED Final   Bordetella pertussis NOT DETECTED NOT DETECTED Final   Chlamydophila pneumoniae NOT DETECTED NOT DETECTED Final   Mycoplasma pneumoniae NOT DETECTED NOT DETECTED Final  Culture, Urine     Status: Abnormal   Collection Time: 07/01/15  7:55 PM  Result Value Ref Range Status   Specimen Description URINE, CLEAN CATCH  Final   Special Requests NONE  Final   Culture MULTIPLE SPECIES PRESENT, SUGGEST RECOLLECTION (A)  Final   Report Status 07/03/2015 FINAL  Final  Culture, blood (Routine X 2) w Reflex to ID Panel     Status: None (Preliminary result)   Collection Time: 07/02/15 11:25 AM  Result Value Ref Range Status   Specimen Description BLOOD RIGHT ARM  Final   Special Requests BOTTLES DRAWN AEROBIC AND ANAEROBIC  6CC  Final   Culture NO GROWTH 4 DAYS  Final   Report Status PENDING  Incomplete  Culture, blood (Routine X 2) w Reflex to ID Panel     Status: None (Preliminary result)   Collection Time: 07/02/15 11:35 AM  Result Value Ref Range Status   Specimen Description BLOOD LEFT ARM  Final   Special Requests BOTTLES DRAWN AEROBIC AND ANAEROBIC  6CC  Final   Culture NO GROWTH 4  DAYS  Final   Report Status PENDING  Incomplete     Time coordinating discharge: Over 30 minutes  SIGNED:   Kathlen Mody, MD  [facility] 07/07/2015, 8:53 AM Pager   If 7PM-7AM, please contact night-coverage [email or web address] Password [email or web address]

## 2015-08-01 ENCOUNTER — Ambulatory Visit (INDEPENDENT_AMBULATORY_CARE_PROVIDER_SITE_OTHER): Payer: Medicare Other | Admitting: Internal Medicine

## 2015-08-01 ENCOUNTER — Encounter: Payer: Self-pay | Admitting: Internal Medicine

## 2015-08-01 VITALS — BP 120/70 | HR 119 | Ht 64.5 in | Wt 112.0 lb

## 2015-08-01 DIAGNOSIS — J449 Chronic obstructive pulmonary disease, unspecified: Secondary | ICD-10-CM | POA: Diagnosis not present

## 2015-08-01 MED ORDER — CLOTRIMAZOLE 10 MG MT TROC: 10.0000 mg | Freq: Every day | OROMUCOSAL | Status: DC

## 2015-08-01 NOTE — Progress Notes (Signed)
History of Present Illness Nicole Maxwell is a 70 y.o. female former smoker with COPD and O2 dependent RF.  TEST  #COPD - Oxygen dependent since 2011 - Doubtamine stress echo 05/12/10 - normal, CXR July 2011 - clear - PFTs 04/13/2010 - shows Gold stage 4 COPD with BD response/asthma component (fev1 0.5L/29%, 10% BD response on fev1 and 38% on FC), DLCO 27%) - Spirometry Jan 2013 - fev1 0.74L/37%, RAtop 44 - shows gold stage 3 copd - CAT score - 23 in May 2013 - CAT score 19 in NOv 2013   #COPD - Oxygen dependent since 2011 - Doubtamine stress echo 05/12/10 - normal, CXR July 2011 - clear - PFTs 04/13/2010 - shows Gold stage 4 COPD with BD response/asthma component (fev1 0.5L/29%, 10% BD response on fev1 and 38% on FC), DLCO 27%) - Spirometry Jan 2013 - fev1 0.74L/37%, RAtop 44 - shows gold stage 3 copd - Class 2-3 exertional dyspnea with precipitants emotion, smoke, lying flat, weather change, pollen, odors - Chronic cough with white sputjum - CAT score - 23 in May 2013 - CAT score 19 in NOv 2013  #Smoking  #Chronic back pain - s/p lumbar fusion surgery ? Date - Preop pulmonary evaluatio prior to spine surgery - 2013, surgery by Dr Danielle Dess never happend  #There is no history of allergies, aspirin allergies, asthma, bronchiolitis, CAD, DVT, a heart failure, PE, pneumonia  # Poor ability to understand questions and answer them. Difficulty following med advice - noticed since march 2013   08/01/2015 2-3 month Follow Up Visit:  Pt. Presents to the office for follow up. She has had a recent admission for a COPD exacerbations and left lower lobe pneumonia.Marland Kitchen She was treated with IV antibiotics , steroids and scheduled nebulizer treatment. She was discharged on levaquin and duonebs and a prednisone taper, but shae is maintained on a 5 mg dose until 7/19 when she goes back to see Dr. Clelia Croft.She had a CXR 07/03/15 which shows resolution of pneumonia. She has been  doing well. She states she does have some trouble with the hot, humid air and increased dyspnea on exertion.. She feels her shortness of breath is at baseline. Secretions are clear to white. She denies any fever, chest pain, orthopnea, or hemoptysis.She has no edema in her lower extremities. Her weight is down to 112.8 lbs. She is taking her medications as prescribed. She states she is using her nocturnal BiPAP at night most of the time. She is continuing to smoke 2-3 cigarettes daily. She states she does use her Pro Air 2-3 times daily.   Significant Events:Recent Hospital Admission:   Admit date: 06/30/2015 Discharge date: 07/07/2015  Admitted From: home  Disposition: Home   Recommendations for Outpatient Follow-up:  1. Follow up with PCP in 1-2 weeks 2. Please obtain BMP/CBC in one week 3. Please follow up with Dr Marchelle Gearing.  Home Health:yes Equipment/Devices:oxygen.   Discharge Condition:guarded.  CODE STATUS:full  Diet recommendation: Heart Healthy   Brief/Interim Summary: 70 y.o. female with a Past Medical History of HTN, PVD, Tobaco abuse, esophageal dysmotility who presents with acute on chronic respiratory failure in the setting of COPD exacerbation.  Discharge Diagnoses:  Principal Problem:  Acute on chronic respiratory failure (HCC) Active Problems:  Severe chronic obstructive pulmonary disease (HCC)  HTN (hypertension)  Hyperlipidemia  COPD exacerbation (HCC)  Diabetes mellitus type 2, noninsulin dependent (HCC)  Chronic diastolic congestive heart failure (HCC)  Leukocytosis  Anemia  CAP (community acquired pneumonia)  Acute  encephalopathy  Tobacco use             Chronic diastolic heart failure  - Compensated. BNP 133. Echo done February 2017 shows EF of 55% rate 1 diastolic dysfunction mild LVH. Home medications include lasix, losartan - Continue home meds  Hypertension - good control. continue current medications  Acute  encephalopathy - likely related to hypercarbia, improved today.   Tobacco use - Patient stopped smoking 1 month ago according to chart. Cessation support offered   Gram positive bacteremia: Vancomycin added, blood cultures showed staph coag negative in both bottles, taken from the same site probably a contaminant. Since she came in with dyspnea, will get echocardiogram done, which did nto show any endocarditis.  Repeat blood cultures ordered, negative.    Leukocytosis probably from steroids. Repeat CBC in am shows improvement.   Anemia: Normocytic, monitor.                  Tests  07/03/2015: CXR IMPRESSION: COPD with mild left basilar atelectasis and small left pleural effusion stable from the recent exam.  Past medical hx Past Medical History  Diagnosis Date  . Hypertension   . Chronic back pain     "goes from the lower part of my back on down my legs"  (04/17/2014)  . Osteopenia   . PVD (peripheral vascular disease) (HCC)   . Tobacco abuse     " I am addicted"   . Vitamin D deficiency disease     03/26/2010 - 9.9ng/mL. Started on replacemnt  . Alcohol abuse, in remission quit 2004  . Neuromuscular disorder (HCC)     lumbar disc  . Anxiety     Xanax prn  . Shortness of breath     with anxiety  and activty  . Asthma   . Hyperlipidemia   . On home oxygen therapy     "2L; suppose to be 24/7" (04/17/2014)  . COPD (chronic obstructive pulmonary disease) (HCC)     cxr 04/03/2010 - hyperinflation  . Pneumonia     "3 times in the last month or so" (04/17/2014)  . Type II diabetes mellitus (HCC)   . Arthritis     "my whole body"  . Physical deconditioning   . Chronic respiratory failure with hypercapnia (HCC)   . HCAP (healthcare-associated pneumonia)   . COPD exacerbation (HCC)   . Leukocytosis   . Dysphagia   . Protein calorie malnutrition (HCC)   . Esophageal dysmotility   . Anemia of chronic disease      Past surgical hx, Family hx, Social hx all  reviewed.  Current Outpatient Prescriptions on File Prior to Visit  Medication Sig  . acetaminophen (TYLENOL) 500 MG tablet Take 500-1,000 mg by mouth every 8 (eight) hours as needed (for pain). Reported on 08/01/2015  . albuterol (PROVENTIL HFA;VENTOLIN HFA) 108 (90 BASE) MCG/ACT inhaler Inhale 2 puffs into the lungs every 4 (four) hours as needed for wheezing or shortness of breath.   Marland Kitchen. albuterol (PROVENTIL) (2.5 MG/3ML) 0.083% nebulizer solution Take 3 mLs (2.5 mg total) by nebulization every 4 (four) hours as needed for wheezing or shortness of breath. (may take 1 every 4 hours as needed for wheezing/shortness of breath) DX: 496 (Patient taking differently: Take 2.5 mg by nebulization every 4 (four) hours as needed for wheezing or shortness of breath. DX: 496)  . alprazolam (XANAX) 2 MG tablet Take 1-2 mg by mouth at bedtime.   Marland Kitchen. aspirin EC 81 MG EC tablet  Take 1 tablet (81 mg total) by mouth daily.  Marland Kitchen atorvastatin (LIPITOR) 20 MG tablet Take 20 mg by mouth daily. evening  . DULoxetine (CYMBALTA) 60 MG capsule Take 60 mg by mouth daily. Reported on 08/01/2015  . fluticasone (FLONASE) 50 MCG/ACT nasal spray Place 1 spray into both nostrils 2 (two) times daily.  . furosemide (LASIX) 40 MG tablet Take 40 mg by mouth every morning.  Marland Kitchen Hydrocod Polst-Chlorphen Polst (TUSSIONEX PENNKINETIC ER PO) Take 5 mLs by mouth 2 (two) times daily as needed (cough).   Marland Kitchen HYDROcodone-acetaminophen (NORCO/VICODIN) 5-325 MG tablet Take 1 tablet by mouth every 6 (six) hours as needed for moderate pain.  Marland Kitchen ipratropium-albuterol (DUONEB) 0.5-2.5 (3) MG/3ML SOLN INHALE 1 VAIL VIA NEBULIZER FOUR TIMES DAILY  . losartan (COZAAR) 50 MG tablet Take 50 mg by mouth daily.  . metFORMIN (GLUCOPHAGE) 500 MG tablet TAKE 1 TABLET BY MOUTH TWICE DAILY  . Multiple Vitamin (MULTIVITAMIN) tablet Take 1 tablet by mouth daily. Reported on 08/01/2015  . ONE TOUCH ULTRA TEST test strip As directed  . pantoprazole (PROTONIX) 40 MG tablet TAKE  1 TABLET BY MOUTH EVERY NIGHT (Patient taking differently: TAKE 1 TABLET BY MOUTH EVERY MORNING)  . potassium chloride SA (K-DUR,KLOR-CON) 20 MEQ tablet Take 1 tablet (20 mEq total) by mouth daily. Take while on lasix  . predniSONE (DELTASONE) 5 MG tablet Take 2 tablets (10 mg total) by mouth daily with breakfast.  . SYMBICORT 160-4.5 MCG/ACT inhaler Inhale 2 puffs into the lungs 2 (two) times daily.   No current facility-administered medications on file prior to visit.     Allergies  Allergen Reactions  . Brovana [Arformoterol Tartrate] Shortness Of Breath and Cough    General intolerance  . Budesonide Shortness Of Breath and Cough    General intolerance  . Mucomyst [Acetylcysteine] Shortness Of Breath    Review Of Systems:  Constitutional:   +  weight loss, night sweats,  Fevers, chills, fatigue, or  lassitude.  HEENT:   No headaches,  Difficulty swallowing,  Tooth/dental problems, or  Sore throat,                No sneezing, itching, ear ache, nasal congestion, post nasal drip,   CV:  No chest pain,  Orthopnea, PND, swelling in lower extremities, anasarca, dizziness, palpitations, syncope.   GI  No heartburn, indigestion, abdominal pain, nausea, vomiting, diarrhea, change in bowel habits, loss of appetite, bloody stools.   Resp: + shortness of breath with exertion less at rest.  No excess mucus, + productive cough,  No non-productive cough,  No coughing up of blood.  No change in color of mucus.  + wheezing.  No chest wall deformity  Skin: no rash or lesions.  GU: no dysuria, change in color of urine, no urgency or frequency.  No flank pain, no hematuria   MS:  No joint pain or swelling.  No decreased range of motion.  No back pain.  Psych:  No change in mood or affect. No depression or anxiety.  No memory loss.   Vital Signs BP 120/70 mmHg  Pulse 119  Ht 5' 4.5" (1.638 m)  Wt 112 lb (50.803 kg)  BMI 18.93 kg/m2  SpO2 93%   Physical Exam:  General- No distress,   A&Ox3, thin and frail ENT: No sinus tenderness, TM clear, pale nasal mucosa, no oral exudate,no post nasal drip, no LAN Cardiac: S1, S2, regular rate and rhythm, no murmur Chest: No wheeze/ rales/ dullness;diminished bilaterally  per bases, no accessory muscle use, no nasal flaring, no sternal retractions Abd.: Soft Non-tender Ext: No clubbing cyanosis, edema Neuro:  normal strength Skin: No rashes, warm and dry Psych: normal mood and behavior   Assessment/Plan  Severe chronic obstructive pulmonary disease (HCC) Stable 3 month interval Plan: Continue home PT, oxygen, nocturnal BiPAP, and nebulizers Please try to quit smoking completely. Continue using Ensure ( Equate brand is ok to use from [employer]) supplements to see if you can gain some weight. Remember to rinse your mouth with water after your inhalers. Mycelex troaches 1 lozenge five time daily x 7 days.   Followup 2-3 months with Dr Marchelle Gearingamaswamy or sooner if needed Please contact office for sooner follow up if symptoms do not improve or worsen or seek emergency care       Bevelyn NgoSarah F Groce, NP 08/01/2015  2:11 PM      Staff note - patient not seen by me due to my need at another medical location   Dr. Kalman ShanMurali Ramaswamy, M.D., [facility].C.C.P Pulmonary and Critical Care Medicine Staff Physician [facility]  Pulmonary and Critical Care Pager: [phone], If no answer or between  15:00h - 7:00h: call [phone]  08/21/2015 2:29 PM

## 2015-08-01 NOTE — Assessment & Plan Note (Signed)
Stable 3 month interval Plan: Continue home PT, oxygen, nocturnal BiPAP, and nebulizers Please try to quit smoking completely. Continue using Ensure ( Equate brand is ok to use from [employer]) supplements to see if you can gain some weight. Remember to rinse your mouth with water after your inhalers. Mycelex troaches 1 lozenge five time daily x 7 days.   Followup 2-3 months with Dr Marchelle Gearingamaswamy or sooner if needed Please contact office for sooner follow up if symptoms do not improve or worsen or seek emergency care

## 2015-08-01 NOTE — Patient Instructions (Addendum)
It is nice to meet you today. Continue home PT, oxygen, nocturnal BiPAP, and nebulizers Please try to quit smoking completely. Continue using Ensure ( Equate brand is ok to use from [employer]) supplements to see if you can gain some weight. Remember to rinse your mouth with water after your inhalers. Mycelex troaches 1 lozenge five time daily x 7 days.   Followup 2-3 months with Dr Marchelle Gearingamaswamy or sooner if needed Please contact office for sooner follow up if symptoms do not improve or worsen or seek emergency care

## 2015-08-04 ENCOUNTER — Other Ambulatory Visit: Payer: Self-pay | Admitting: Internal Medicine

## 2015-09-10 ENCOUNTER — Encounter (HOSPITAL_COMMUNITY): Payer: Self-pay | Admitting: Emergency Medicine

## 2015-09-10 ENCOUNTER — Inpatient Hospital Stay (HOSPITAL_COMMUNITY)
Admission: EM | Admit: 2015-09-10 | Discharge: 2015-09-16 | DRG: 190 | Disposition: A | Discharge status: Home / Self Care | Payer: Medicare Other | Attending: Family Medicine | Admitting: Family Medicine

## 2015-09-10 ENCOUNTER — Emergency Department (HOSPITAL_COMMUNITY): Payer: Medicare Other

## 2015-09-10 DIAGNOSIS — E1151 Type 2 diabetes mellitus with diabetic peripheral angiopathy without gangrene: Secondary | ICD-10-CM | POA: Diagnosis present

## 2015-09-10 DIAGNOSIS — Z9841 Cataract extraction status, right eye: Secondary | ICD-10-CM

## 2015-09-10 DIAGNOSIS — Z79899 Other long term (current) drug therapy: Secondary | ICD-10-CM

## 2015-09-10 DIAGNOSIS — F419 Anxiety disorder, unspecified: Secondary | ICD-10-CM | POA: Diagnosis present

## 2015-09-10 DIAGNOSIS — J9622 Acute and chronic respiratory failure with hypercapnia: Secondary | ICD-10-CM | POA: Diagnosis present

## 2015-09-10 DIAGNOSIS — E785 Hyperlipidemia, unspecified: Secondary | ICD-10-CM | POA: Diagnosis present

## 2015-09-10 DIAGNOSIS — Z981 Arthrodesis status: Secondary | ICD-10-CM

## 2015-09-10 DIAGNOSIS — Z720 Tobacco use: Secondary | ICD-10-CM | POA: Diagnosis present

## 2015-09-10 DIAGNOSIS — E119 Type 2 diabetes mellitus without complications: Secondary | ICD-10-CM

## 2015-09-10 DIAGNOSIS — G8929 Other chronic pain: Secondary | ICD-10-CM | POA: Diagnosis present

## 2015-09-10 DIAGNOSIS — I110 Hypertensive heart disease with heart failure: Secondary | ICD-10-CM | POA: Diagnosis present

## 2015-09-10 DIAGNOSIS — I10 Essential (primary) hypertension: Secondary | ICD-10-CM | POA: Diagnosis not present

## 2015-09-10 DIAGNOSIS — J9621 Acute and chronic respiratory failure with hypoxia: Secondary | ICD-10-CM | POA: Diagnosis present

## 2015-09-10 DIAGNOSIS — Z8249 Family history of ischemic heart disease and other diseases of the circulatory system: Secondary | ICD-10-CM

## 2015-09-10 DIAGNOSIS — R0602 Shortness of breath: Secondary | ICD-10-CM | POA: Diagnosis not present

## 2015-09-10 DIAGNOSIS — M8580 Other specified disorders of bone density and structure, unspecified site: Secondary | ICD-10-CM | POA: Diagnosis present

## 2015-09-10 DIAGNOSIS — Z87891 Personal history of nicotine dependence: Secondary | ICD-10-CM

## 2015-09-10 DIAGNOSIS — E559 Vitamin D deficiency, unspecified: Secondary | ICD-10-CM | POA: Diagnosis present

## 2015-09-10 DIAGNOSIS — D649 Anemia, unspecified: Secondary | ICD-10-CM

## 2015-09-10 DIAGNOSIS — J441 Chronic obstructive pulmonary disease with (acute) exacerbation: Principal | ICD-10-CM | POA: Diagnosis present

## 2015-09-10 DIAGNOSIS — K224 Dyskinesia of esophagus: Secondary | ICD-10-CM | POA: Diagnosis present

## 2015-09-10 DIAGNOSIS — Z7982 Long term (current) use of aspirin: Secondary | ICD-10-CM

## 2015-09-10 DIAGNOSIS — D638 Anemia in other chronic diseases classified elsewhere: Secondary | ICD-10-CM | POA: Diagnosis present

## 2015-09-10 DIAGNOSIS — R0689 Other abnormalities of breathing: Secondary | ICD-10-CM

## 2015-09-10 DIAGNOSIS — Z9981 Dependence on supplemental oxygen: Secondary | ICD-10-CM

## 2015-09-10 DIAGNOSIS — Z7952 Long term (current) use of systemic steroids: Secondary | ICD-10-CM

## 2015-09-10 DIAGNOSIS — Z9842 Cataract extraction status, left eye: Secondary | ICD-10-CM

## 2015-09-10 DIAGNOSIS — J9620 Acute and chronic respiratory failure, unspecified whether with hypoxia or hypercapnia: Secondary | ICD-10-CM | POA: Diagnosis present

## 2015-09-10 DIAGNOSIS — M549 Dorsalgia, unspecified: Secondary | ICD-10-CM | POA: Diagnosis present

## 2015-09-10 DIAGNOSIS — Z888 Allergy status to other drugs, medicaments and biological substances status: Secondary | ICD-10-CM

## 2015-09-10 DIAGNOSIS — Z961 Presence of intraocular lens: Secondary | ICD-10-CM | POA: Diagnosis present

## 2015-09-10 DIAGNOSIS — Z803 Family history of malignant neoplasm of breast: Secondary | ICD-10-CM

## 2015-09-10 DIAGNOSIS — Z7951 Long term (current) use of inhaled steroids: Secondary | ICD-10-CM

## 2015-09-10 DIAGNOSIS — Z7984 Long term (current) use of oral hypoglycemic drugs: Secondary | ICD-10-CM

## 2015-09-10 DIAGNOSIS — I5032 Chronic diastolic (congestive) heart failure: Secondary | ICD-10-CM | POA: Diagnosis present

## 2015-09-10 LAB — BLOOD GAS, ARTERIAL
Acid-Base Excess: 8 mmol/L — ABNORMAL HIGH (ref 0.0–2.0)
BICARBONATE: 33.1 meq/L — AB (ref 20.0–24.0)
DRAWN BY: 232811
O2 CONTENT: 1 L/min
O2 Saturation: 93.7 %
PH ART: 7.416 (ref 7.350–7.450)
Patient temperature: 98.9
TCO2: 31 mmol/L (ref 0–100)
pCO2 arterial: 52.6 mmHg — ABNORMAL HIGH (ref 35.0–45.0)
pO2, Arterial: 71.2 mmHg — ABNORMAL LOW (ref 80.0–100.0)

## 2015-09-10 LAB — BASIC METABOLIC PANEL
Anion gap: 9 (ref 5–15)
BUN: 22 mg/dL — AB (ref 6–20)
CALCIUM: 9.1 mg/dL (ref 8.9–10.3)
CO2: 32 mmol/L (ref 22–32)
CREATININE: 0.62 mg/dL (ref 0.44–1.00)
Chloride: 99 mmol/L — ABNORMAL LOW (ref 101–111)
GFR calc Af Amer: >60 mL/min (ref >60)
Glucose, Bld: 120 mg/dL — ABNORMAL HIGH (ref 65–99)
Potassium: 4.5 mmol/L (ref 3.5–5.1)
SODIUM: 140 mmol/L (ref 135–145)

## 2015-09-10 LAB — CBC WITH DIFFERENTIAL/PLATELET
BASOS ABS: 0 10*3/uL (ref 0.0–0.1)
Basophils Relative: 0 %
EOS ABS: 0 10*3/uL (ref 0.0–0.7)
EOS PCT: 0 %
HCT: 31.5 % — ABNORMAL LOW (ref 36.0–46.0)
Hemoglobin: 9.1 g/dL — ABNORMAL LOW (ref 12.0–15.0)
LYMPHS ABS: 1.6 10*3/uL (ref 0.7–4.0)
Lymphocytes Relative: 17 %
MCH: 24.1 pg — AB (ref 26.0–34.0)
MCHC: 28.9 g/dL — ABNORMAL LOW (ref 30.0–36.0)
MCV: 83.3 fL (ref 78.0–100.0)
Monocytes Absolute: 0.5 10*3/uL (ref 0.1–1.0)
Monocytes Relative: 6 %
Neutro Abs: 7.3 10*3/uL (ref 1.7–7.7)
Neutrophils Relative %: 77 %
PLATELETS: 245 10*3/uL (ref 150–400)
RBC: 3.78 MIL/uL — AB (ref 3.87–5.11)
RDW: 16 % — ABNORMAL HIGH (ref 11.5–15.5)
WBC: 9.4 10*3/uL (ref 4.0–10.5)

## 2015-09-10 LAB — GLUCOSE, CAPILLARY
GLUCOSE-CAPILLARY: 202 mg/dL — AB (ref 65–99)
Glucose-Capillary: 202 mg/dL — ABNORMAL HIGH (ref 65–99)
Glucose-Capillary: 166 mg/dL — ABNORMAL HIGH (ref 65–99)

## 2015-09-10 MED ORDER — INSULIN ASPART 100 UNIT/ML ~~LOC~~ SOLN
0.0000 [IU] | Freq: Every day | SUBCUTANEOUS | Status: DC
  Administered 2015-09-10: 2 [IU] via SUBCUTANEOUS

## 2015-09-10 MED ORDER — IPRATROPIUM-ALBUTEROL 0.5-2.5 (3) MG/3ML IN SOLN
3.0000 mL | RESPIRATORY_TRACT | Status: DC
  Administered 2015-09-10 (×4): 3 mL via RESPIRATORY_TRACT
  Filled 2015-09-10 (×3): qty 3

## 2015-09-10 MED ORDER — IPRATROPIUM-ALBUTEROL 0.5-2.5 (3) MG/3ML IN SOLN
3.0000 mL | Freq: Three times a day (TID) | RESPIRATORY_TRACT | Status: DC
  Administered 2015-09-11 – 2015-09-16 (×16): 3 mL via RESPIRATORY_TRACT
  Filled 2015-09-10 (×17): qty 3

## 2015-09-10 MED ORDER — SODIUM CHLORIDE 0.9 % IV SOLN
250.0000 mL | INTRAVENOUS | Status: DC | PRN
  Administered 2015-09-10: 250 mL via INTRAVENOUS

## 2015-09-10 MED ORDER — ENOXAPARIN SODIUM 40 MG/0.4ML ~~LOC~~ SOLN
40.0000 mg | SUBCUTANEOUS | Status: DC
  Administered 2015-09-10 – 2015-09-16 (×7): 40 mg via SUBCUTANEOUS
  Filled 2015-09-10 (×7): qty 0.4

## 2015-09-10 MED ORDER — IPRATROPIUM-ALBUTEROL 0.5-2.5 (3) MG/3ML IN SOLN
3.0000 mL | RESPIRATORY_TRACT | Status: DC | PRN
  Filled 2015-09-10: qty 3

## 2015-09-10 MED ORDER — ONDANSETRON HCL 4 MG PO TABS
4.0000 mg | ORAL_TABLET | Freq: Four times a day (QID) | ORAL | Status: DC | PRN
Start: 2015-09-10 — End: 2015-09-16

## 2015-09-10 MED ORDER — NICOTINE POLACRILEX 2 MG MT GUM
2.0000 mg | CHEWING_GUM | OROMUCOSAL | Status: DC | PRN
  Filled 2015-09-10: qty 1

## 2015-09-10 MED ORDER — INSULIN ASPART 100 UNIT/ML ~~LOC~~ SOLN
0.0000 [IU] | Freq: Three times a day (TID) | SUBCUTANEOUS | Status: DC
  Administered 2015-09-10: 2 [IU] via SUBCUTANEOUS
  Administered 2015-09-10: 3 [IU] via SUBCUTANEOUS
  Administered 2015-09-11 – 2015-09-13 (×5): 1 [IU] via SUBCUTANEOUS
  Administered 2015-09-14: 2 [IU] via SUBCUTANEOUS
  Administered 2015-09-14: 3 [IU] via SUBCUTANEOUS
  Administered 2015-09-14 – 2015-09-15 (×4): 1 [IU] via SUBCUTANEOUS
  Administered 2015-09-16 (×2): 2 [IU] via SUBCUTANEOUS

## 2015-09-10 MED ORDER — IPRATROPIUM-ALBUTEROL 0.5-2.5 (3) MG/3ML IN SOLN
3.0000 mL | Freq: Once | RESPIRATORY_TRACT | Status: AC
  Administered 2015-09-10: 3 mL via RESPIRATORY_TRACT
  Filled 2015-09-10: qty 3

## 2015-09-10 MED ORDER — METHYLPREDNISOLONE SODIUM SUCC 125 MG IJ SOLR
60.0000 mg | Freq: Four times a day (QID) | INTRAMUSCULAR | Status: DC
  Administered 2015-09-10 – 2015-09-16 (×25): 60 mg via INTRAVENOUS
  Filled 2015-09-10 (×25): qty 2

## 2015-09-10 MED ORDER — SODIUM CHLORIDE 0.9% FLUSH: 3.0000 mL | INTRAVENOUS | Status: DC | PRN

## 2015-09-10 MED ORDER — SODIUM CHLORIDE 0.9% FLUSH
3.0000 mL | Freq: Two times a day (BID) | INTRAVENOUS | Status: DC
  Administered 2015-09-10 – 2015-09-16 (×11): 3 mL via INTRAVENOUS

## 2015-09-10 MED ORDER — LOSARTAN POTASSIUM 50 MG PO TABS
50.0000 mg | ORAL_TABLET | Freq: Every day | ORAL | Status: DC
  Administered 2015-09-10 – 2015-09-16 (×7): 50 mg via ORAL
  Filled 2015-09-10 (×7): qty 1

## 2015-09-10 MED ORDER — POTASSIUM CHLORIDE CRYS ER 20 MEQ PO TBCR
20.0000 meq | EXTENDED_RELEASE_TABLET | Freq: Every day | ORAL | Status: DC
  Administered 2015-09-10 – 2015-09-16 (×7): 20 meq via ORAL
  Filled 2015-09-10 (×7): qty 1

## 2015-09-10 MED ORDER — ASPIRIN EC 81 MG PO TBEC
81.0000 mg | DELAYED_RELEASE_TABLET | Freq: Every day | ORAL | Status: DC
  Administered 2015-09-10 – 2015-09-16 (×7): 81 mg via ORAL
  Filled 2015-09-10 (×7): qty 1

## 2015-09-10 MED ORDER — ATORVASTATIN CALCIUM 20 MG PO TABS
20.0000 mg | ORAL_TABLET | Freq: Every evening | ORAL | Status: DC
  Administered 2015-09-10 – 2015-09-15 (×6): 20 mg via ORAL
  Filled 2015-09-10 (×2): qty 2
  Filled 2015-09-10 (×3): qty 1
  Filled 2015-09-10: qty 2
  Filled 2015-09-10: qty 1
  Filled 2015-09-10: qty 2
  Filled 2015-09-10 (×2): qty 1
  Filled 2015-09-10: qty 2
  Filled 2015-09-10: qty 1
  Filled 2015-09-10: qty 2

## 2015-09-10 MED ORDER — ACETAMINOPHEN 500 MG PO TABS
500.0000 mg | ORAL_TABLET | Freq: Three times a day (TID) | ORAL | Status: DC | PRN
  Administered 2015-09-10: 1000 mg via ORAL
  Filled 2015-09-10: qty 2

## 2015-09-10 MED ORDER — MOMETASONE FURO-FORMOTEROL FUM 200-5 MCG/ACT IN AERO
2.0000 | INHALATION_SPRAY | Freq: Two times a day (BID) | RESPIRATORY_TRACT | Status: DC
  Administered 2015-09-10 – 2015-09-16 (×13): 2 via RESPIRATORY_TRACT
  Filled 2015-09-10: qty 8.8

## 2015-09-10 MED ORDER — DOXYCYCLINE HYCLATE 100 MG IV SOLR
100.0000 mg | Freq: Two times a day (BID) | INTRAVENOUS | Status: DC
  Administered 2015-09-10 (×2): 100 mg via INTRAVENOUS
  Filled 2015-09-10 (×4): qty 100

## 2015-09-10 MED ORDER — ALPRAZOLAM 0.25 MG PO TABS
0.2500 mg | ORAL_TABLET | Freq: Every day | ORAL | Status: DC
Start: 2015-09-10 — End: 2015-09-16
  Administered 2015-09-10 – 2015-09-15 (×6): 0.25 mg via ORAL
  Filled 2015-09-10 (×6): qty 1

## 2015-09-10 MED ORDER — PREDNISONE 20 MG PO TABS
60.0000 mg | ORAL_TABLET | Freq: Once | ORAL | Status: AC
  Administered 2015-09-10: 60 mg via ORAL
  Filled 2015-09-10: qty 3

## 2015-09-10 MED ORDER — DULOXETINE HCL 60 MG PO CPEP
60.0000 mg | ORAL_CAPSULE | Freq: Every day | ORAL | Status: DC
  Administered 2015-09-10 – 2015-09-16 (×7): 60 mg via ORAL
  Filled 2015-09-10: qty 1
  Filled 2015-09-10 (×2): qty 2
  Filled 2015-09-10: qty 1
  Filled 2015-09-10: qty 2
  Filled 2015-09-10 (×4): qty 1
  Filled 2015-09-10 (×3): qty 2
  Filled 2015-09-10: qty 1

## 2015-09-10 MED ORDER — ONDANSETRON HCL 4 MG/2ML IJ SOLN: 4.0000 mg | Freq: Four times a day (QID) | INTRAMUSCULAR | Status: DC | PRN

## 2015-09-10 MED ORDER — GUAIFENESIN ER 600 MG PO TB12
1200.0000 mg | ORAL_TABLET | Freq: Two times a day (BID) | ORAL | Status: DC
  Administered 2015-09-10 – 2015-09-16 (×13): 1200 mg via ORAL
  Filled 2015-09-10 (×13): qty 2

## 2015-09-10 MED ORDER — GUAIFENESIN-DM 100-10 MG/5ML PO SYRP
5.0000 mL | ORAL_SOLUTION | ORAL | Status: DC | PRN
  Filled 2015-09-10: qty 10

## 2015-09-10 MED ORDER — FUROSEMIDE 40 MG PO TABS
20.0000 mg | ORAL_TABLET | Freq: Every day | ORAL | Status: DC
  Administered 2015-09-10 – 2015-09-16 (×7): 20 mg via ORAL
  Filled 2015-09-10 (×7): qty 1

## 2015-09-10 NOTE — ED Triage Notes (Signed)
Pt BIB EMS from home after waking up this AM feeling like she couldn't breath; pt reports worsening SOB for past 2 days; pt reports acute onset of sharp chest pain last night that went away; c/o coughing more than usual; pt took home breathing treatment before calling 911; 2nd EMS breathing treatment in progress on arrival.

## 2015-09-10 NOTE — ED Notes (Signed)
Bed: WA03 Expected date:  Expected time:  Means of arrival:  Comments: EMS 70yo F shortness of breath

## 2015-09-10 NOTE — H&P (Signed)
History and Physical    Nicole Ibalta S Gatz ZOX:[id number] DOB: 01-31-1945 DOA: 09/10/2015  PCP: [name], William, MD Patient coming from: Home  Chief Complaint: SOB  HPI: Nicole Maxwell is a 70 y.o. female with medical history significant of ETOH, abuse, anemia, COPD, esophageal dysmotility, HTN, HLD, PVD, DM presenting w/ 3 days of worsening SOB and nonproductive cough. Home nebs w/ limited benefit. Worse w/ esertion. Continues to smoke 1 cig per day. Denies chest pain, palpitations, lower extremity swelling, neck stiffness, headache, LOC, fevers, dysuria, frequency, back pain.    ED Course: Patient noted initially to be hypoxic and placed on nonrebreather with improvement in O2 saturations. Given nebulizer treatments and prednisone 60 mg by mouth with improvement in overall condition. Patient remains fairly dyspneic especially on exertion. Difficulty obtaining IV access.  Review of Systems: As per HPI otherwise 10 point review of systems negative.   Ambulatory Status: Restricted only by ability to breathe.  Past Medical History:  Diagnosis Date  . Alcohol abuse, in remission quit 2004  . Anemia of chronic disease   . Anxiety    Xanax prn  . Arthritis    "my whole body"  . Asthma   . Chronic back pain    "goes from the lower part of my back on down my legs"  (04/17/2014)  . Chronic respiratory failure with hypercapnia (HCC)   . COPD (chronic obstructive pulmonary disease) (HCC)    cxr 04/03/2010 - hyperinflation  . COPD exacerbation (HCC)   . Dysphagia   . Esophageal dysmotility   . HCAP (healthcare-associated pneumonia)   . Hyperlipidemia   . Hypertension   . Leukocytosis   . Neuromuscular disorder (HCC)    lumbar disc  . On home oxygen therapy    "2L; suppose to be 24/7" (04/17/2014)  . Osteopenia   . Physical deconditioning   . Pneumonia    "3 times in the last month or so" (04/17/2014)  . Protein calorie malnutrition (HCC)   . PVD (peripheral vascular disease) (HCC)   .  Shortness of breath    with anxiety  and activty  . Tobacco abuse    " I am addicted"   . Type II diabetes mellitus (HCC)   . Vitamin D deficiency disease    03/26/2010 - 9.9ng/mL. Started on replacemnt    Past Surgical History:  Procedure Laterality Date  . ABDOMINAL HYSTERECTOMY     "partial"  . APPENDECTOMY  1960  . BACK SURGERY    . CARPAL TUNNEL RELEASE Bilateral    CTS repair 2000 (R), 2006 (L)/notes 01/01/2014  . CATARACT EXTRACTION W/ INTRAOCULAR LENS  IMPLANT, BILATERAL Bilateral 2015  . LUMBAR LAMINECTOMY/DECOMPRESSION MICRODISCECTOMY  2005   L4-5/notes 03/06/2010  . POSTERIOR LUMBAR FUSION  2002  . TONSILLECTOMY  1960    Social History   Social History  . Marital status: Widowed    Spouse name: N/A  . Number of children: 1  . Years of education: N/A   Occupational History  . retired from [employer] Retired   Social History Main Topics  . Smoking status: Former Smoker    Packs/day: 0.25    Years: 56.00    Types: Cigarettes    Quit date: 01/27/2015  . Smokeless tobacco: Never Used  . Alcohol use 0.0 oz/week     Comment: h/o heavy ETOH quit 2004  . Drug use: No  . Sexual activity: No   Other Topics Concern  . Not on file   Social History  Narrative  . No narrative on file    Allergies  Allergen Reactions  . Brovana [Arformoterol Tartrate] Shortness Of Breath and Cough    General intolerance  . Budesonide Shortness Of Breath and Cough    General intolerance  . Mucomyst [Acetylcysteine] Shortness Of Breath    Family History  Problem Relation Age of Onset  . Heart attack Sister 50    CABG  . Breast cancer Sister   . Cancer Brother   . Heart attack Brother   . Hypertension Other     all siblings  . Anesthesia problems Neg Hx     Prior to Admission medications   Medication Sig Start Date End Date Taking? Authorizing Provider  acetaminophen (TYLENOL) 500 MG tablet Take 500-1,000 mg by mouth every 8 (eight) hours as needed (for pain).  Reported on 08/01/2015    Historical Provider, MD  albuterol (PROVENTIL HFA;VENTOLIN HFA) 108 (90 BASE) MCG/ACT inhaler Inhale 2 puffs into the lungs every 4 (four) hours as needed for wheezing or shortness of breath.     Historical Provider, MD  albuterol (PROVENTIL) (2.5 MG/3ML) 0.083% nebulizer solution Take 3 mLs (2.5 mg total) by nebulization every 4 (four) hours as needed for wheezing or shortness of breath. (may take 1 every 4 hours as needed for wheezing/shortness of breath) DX: 496 Patient taking differently: Take 2.5 mg by nebulization every 4 (four) hours as needed for wheezing or shortness of breath. DX: 496 01/09/14   [name], MD  alprazolam Prudy Feeler) 2 MG tablet Take 1-2 mg by mouth at bedtime.  06/21/15   Historical Provider, MD  aspirin EC 81 MG EC tablet Take 1 tablet (81 mg total) by mouth daily. 01/09/14   [name], MD  atorvastatin (LIPITOR) 20 MG tablet Take 20 mg by mouth daily. evening 09/14/14   Historical Provider, MD  clotrimazole (MYCELEX) 10 MG troche Take 1 tablet (10 mg total) by mouth 5 (five) times daily. 08/01/15   Kalman Shan, MD  DULoxetine (CYMBALTA) 60 MG capsule Take 60 mg by mouth daily. Reported on 08/01/2015    Historical Provider, MD  fluticasone (FLONASE) 50 MCG/ACT nasal spray Place 1 spray into both nostrils 2 (two) times daily. 12/23/14   Sharee Holster, NP  furosemide (LASIX) 40 MG tablet Take 40 mg by mouth every morning.    Historical Provider, MD  Hydrocod Polst-Chlorphen Polst [facility] PENNKINETIC ER PO) Take 5 mLs by mouth 2 (two) times daily as needed (cough).     Historical Provider, MD  HYDROcodone-acetaminophen (NORCO/VICODIN) 5-325 MG tablet Take 1 tablet by mouth every 6 (six) hours as needed for moderate pain.    Historical Provider, MD  ipratropium-albuterol (DUONEB) 0.5-2.5 (3) MG/3ML SOLN INHALE 1 VAIL VIA NEBULIZER FOUR TIMES DAILY 08/04/15   Kalman Shan, MD  losartan (COZAAR) 50 MG tablet Take 50 mg by mouth daily.    Historical  Provider, MD  metFORMIN (GLUCOPHAGE) 500 MG tablet TAKE 1 TABLET BY MOUTH TWICE DAILY 01/07/15   Sharee Holster, NP  Multiple Vitamin (MULTIVITAMIN) tablet Take 1 tablet by mouth daily. Reported on 08/01/2015    Historical Provider, MD  ONE TOUCH ULTRA TEST test strip As directed 04/24/15   Historical Provider, MD  pantoprazole (PROTONIX) 40 MG tablet TAKE 1 TABLET BY MOUTH EVERY NIGHT Patient taking differently: TAKE 1 TABLET BY MOUTH EVERY MORNING 01/07/15   Sharee Holster, NP  potassium chloride SA (K-DUR,KLOR-CON) 20 MEQ tablet Take 1 tablet (20 mEq total) by mouth daily.  Take while on lasix 12/12/14   Jeanella CrazeBrandi L Ollis, NP  predniSONE (DELTASONE) 5 MG tablet Take 2 tablets (10 mg total) by mouth daily with breakfast. 07/12/15   Kathlen ModyVijaya Akula, MD  SYMBICORT 160-4.5 MCG/ACT inhaler Inhale 2 puffs into the lungs 2 (two) times daily. 05/20/15   Historical Provider, MD    Physical Exam: Vitals:   09/10/15 0530 09/10/15 0630 09/10/15 0653 09/10/15 0700  BP: 128/91 142/63  147/69  Pulse: 105 96  98  Resp: 22 22  21   Temp:      SpO2: 99% 96% 94% 100%     General:  Appears calm and comfortable Eyes:  PERRL, EOMI, normal lids, iris ENT:  grossly normal hearing, lips & tongue, mmm Neck:  no LAD, masses or thyromegaly Cardiovascular:  RRR, no m/r/g. No LE edema.  Respiratory:  Coarse breath sounds and wheezing in all lung fields. Mild increased effort at rest but patient becomes markedly dyspneic with even mild exertion. Coughing episodes are very difficult for the patient and she becomes very red-faced and hypoxic. Abdomen:  soft, ntnd, NABS Skin:  no rash or induration seen on limited exam Musculoskeletal:  grossly normal tone BUE/BLE, good ROM, no bony abnormality Psychiatric:  grossly normal mood and affect, speech fluent and appropriate, AOx3 Neurologic:  CN 2-12 grossly intact, moves all extremities in coordinated fashion, sensation intact  Labs on Admission: I have personally reviewed  following labs and imaging studies  CBC:  Recent Labs Lab 09/10/15 0545  WBC 9.4  NEUTROABS 7.3  HGB 9.1*  HCT 31.5*  MCV 83.3  PLT 245   Basic Metabolic Panel:  Recent Labs Lab 09/10/15 0545  NA 140  K 4.5  CL 99*  CO2 32  GLUCOSE 120*  BUN 22*  CREATININE 0.62  CALCIUM 9.1   GFR: CrCl cannot be calculated (Unknown ideal weight.). Liver Function Tests: No results for input(s): AST, ALT, ALKPHOS, BILITOT, PROT, ALBUMIN in the last 168 hours. No results for input(s): LIPASE, AMYLASE in the last 168 hours. No results for input(s): AMMONIA in the last 168 hours. Coagulation Profile: No results for input(s): INR, PROTIME in the last 168 hours. Cardiac Enzymes: No results for input(s): CKTOTAL, CKMB, CKMBINDEX, TROPONINI in the last 168 hours. BNP (last 3 results) No results for input(s): PROBNP in the last 8760 hours. HbA1C: No results for input(s): HGBA1C in the last 72 hours. CBG: No results for input(s): GLUCAP in the last 168 hours. Lipid Profile: No results for input(s): CHOL, HDL, LDLCALC, TRIG, CHOLHDL, LDLDIRECT in the last 72 hours. Thyroid Function Tests: No results for input(s): TSH, T4TOTAL, FREET4, T3FREE, THYROIDAB in the last 72 hours. Anemia Panel: No results for input(s): VITAMINB12, FOLATE, FERRITIN, TIBC, IRON, RETICCTPCT in the last 72 hours. Urine analysis:    Component Value Date/Time   COLORURINE YELLOW 03/15/2015 2336   APPEARANCEUR CLEAR 03/15/2015 2336   LABSPEC 1.020 03/15/2015 2336   PHURINE 5.5 03/15/2015 2336   GLUCOSEU NEGATIVE 03/15/2015 2336   HGBUR NEGATIVE 03/15/2015 2336   BILIRUBINUR NEGATIVE 03/15/2015 2336   KETONESUR NEGATIVE 03/15/2015 2336   PROTEINUR 30 (A) 03/15/2015 2336   NITRITE NEGATIVE 03/15/2015 2336   LEUKOCYTESUR NEGATIVE 03/15/2015 2336    Creatinine Clearance: CrCl cannot be calculated (Unknown ideal weight.).  Sepsis Labs: @LABRCNTIP (procalcitonin:4,lacticidven:4) )No results found for this or  any previous visit (from the past 240 hour(s)).   Radiological Exams on Admission: Dg Chest 2 View  Result Date: 09/10/2015 CLINICAL DATA:  Patient woke up  with chest tightness and shortness of breath. Smoker. EXAM: CHEST  2 VIEW COMPARISON:  07/03/2015 FINDINGS: Hyperinflation consistent with emphysema. Normal heart size and pulmonary vascularity. No focal airspace disease or consolidation in the lungs. No blunting of costophrenic angles. No pneumothorax. Mediastinal contours appear intact. Degenerative changes in the spine. Calcification of the aorta. IMPRESSION: Emphysematous changes in the lungs. No evidence of active pulmonary disease. Electronically Signed   By: Burman Nieves M.D.   On: 09/10/2015 04:27      Assessment/Plan Active Problems:   COPD exacerbation (HCC)   Diabetes mellitus type 2, noninsulin dependent (HCC)   COPD with acute exacerbation (HCC)   Acute on chronic respiratory failure (HCC)   Chronic diastolic congestive heart failure (HCC)   Essential hypertension   Tobacco use   Acute on chronic hypercarbic respiratory failure: Patient initially required nonrebreather and continues to be significantly dyspneic with drop in O2 saturations with exertion. Currently on [place]. ABG showing pH 7.4, PCO2 52, PaO2 71, bicarbonate 33.1. CXR w/o evidence of pneumonia or other acute process. Patient admitted in early June for the same. Patient required BiPAP at night. Patient's pulmonologist Dr. Marchelle Gearing - Med surge - obs - Prednisone 60 Q8 - duonebs Q4/Q2prn - Mucinex - Doxy - BIPAP at night (pts home regimen) - Continue Dulera, Singulair  Chronic diastolic congestive heart failure: Last echo showing EF 55% and grade 1 diastolic description. No evidence of acute decompensation. - Continue ARB, Lasix, potassium  DM: on metformin only - SSI  Anxiety: - Continue Xanax when necessary  Hypertension: Normotensive - Continue losartan  HLD: - Continue Lipitor  Tobacco:  continues to smoke 1-2 cig per day - nicotine.  - counseling provided.    DVT prophylaxis: Lovenox  Code Status: full  Family Communication: daughter  Disposition Plan: pendingi mprovement  Consults called: none  Admission status: med surge obs.     Meyli Boice J MD [facility]  If 7PM-7AM, please contact night-coverage [email or web address] Password [facility]  09/10/2015, 8:27 AM

## 2015-09-10 NOTE — ED Provider Notes (Signed)
[facility]-EMERGENCY DEPT Provider Note   CSN: [id number] Arrival date & time: 09/10/15  0340     History   Chief Complaint Chief Complaint  Patient presents with  . Shortness of Breath    HPI Nicole Maxwell is a 70 y.o. female.  The history is provided by the patient.  She has a history of COPD with hypercarbia. For the last 3 days, she has had increasing difficulty breathing and a nonproductive cough. She denies fever or chills. She denies chest pain. She has been using her home nebulizer with only slight benefit. Breathing is worse with exertion. Of note, she is still smoking 1 cigarette a day. She was brought in by ambulance where she received an albuterol nebulizer treatment with slight improvement.  Past Medical History:  Diagnosis Date  . Alcohol abuse, in remission quit 2004  . Anemia of chronic disease   . Anxiety    Xanax prn  . Arthritis    "my whole body"  . Asthma   . Chronic back pain    "goes from the lower part of my back on down my legs"  (04/17/2014)  . Chronic respiratory failure with hypercapnia (HCC)   . COPD (chronic obstructive pulmonary disease) (HCC)    cxr 04/03/2010 - hyperinflation  . COPD exacerbation (HCC)   . Dysphagia   . Esophageal dysmotility   . HCAP (healthcare-associated pneumonia)   . Hyperlipidemia   . Hypertension   . Leukocytosis   . Neuromuscular disorder (HCC)    lumbar disc  . On home oxygen therapy    "2L; suppose to be 24/7" (04/17/2014)  . Osteopenia   . Physical deconditioning   . Pneumonia    "3 times in the last month or so" (04/17/2014)  . Protein calorie malnutrition (HCC)   . PVD (peripheral vascular disease) (HCC)   . Shortness of breath    with anxiety  and activty  . Tobacco abuse    " I am addicted"   . Type II diabetes mellitus (HCC)   . Vitamin D deficiency disease    03/26/2010 - 9.9ng/mL. Started on replacemnt    Patient Active Problem List   Diagnosis Date Noted  . Anemia 06/30/2015  . CAP (community  acquired pneumonia) 06/30/2015  . Acute encephalopathy 06/30/2015  . Tobacco use 06/30/2015  . Abnormal echocardiogram 04/07/2015  . Protein-calorie malnutrition, severe 03/23/2015  . Acute respiratory failure with hypoxia and hypercarbia (HCC) 03/22/2015  . Slow transit constipation 01/07/2015  . Normocytic anemia 11/27/2014  . Essential hypertension 11/27/2014  . Leukocytosis 11/26/2014  . Physical deconditioning 07/01/2014  . Smoking 07/01/2014  . Pulmonary infiltrate in left lung on chest x-ray 04/16/2014  . DM type 2 (diabetes mellitus, type 2) (HCC) 04/16/2014  . Chronic respiratory failure (HCC) 04/16/2014  . Vitamin D deficiency disease 04/16/2014  . Pulmonary infiltrate 04/16/2014  . Esophageal dysmotility 02/27/2014  . HCAP (healthcare-associated pneumonia) 02/21/2014  . Healthcare-associated pneumonia 02/21/2014  . Abdominal pain, epigastric 01/09/2014  . Chronic diastolic congestive heart failure (HCC) 01/07/2014  . Malnutrition of moderate degree (HCC) 01/03/2014  . Acute on chronic respiratory failure (HCC) 01/03/2014  . COPD with acute exacerbation (HCC) 01/01/2014  . Hyponatremia 04/09/2012    Class: Acute  . Diabetes mellitus type 2, noninsulin dependent (HCC) 04/06/2012  . COPD exacerbation (HCC) 12/09/2011  . Preop pulmonary/respiratory exam 02/28/2011  . HTN (hypertension) 05/12/2010  . Hyperlipidemia 05/12/2010  . Severe chronic obstructive pulmonary disease (HCC) 04/29/2010  . Chest pain 04/29/2010  Past Surgical History:  Procedure Laterality Date  . ABDOMINAL HYSTERECTOMY     "partial"  . APPENDECTOMY  1960  . BACK SURGERY    . CARPAL TUNNEL RELEASE Bilateral    CTS repair 2000 (R), 2006 (L)/notes 01/01/2014  . CATARACT EXTRACTION W/ INTRAOCULAR LENS  IMPLANT, BILATERAL Bilateral 2015  . LUMBAR LAMINECTOMY/DECOMPRESSION MICRODISCECTOMY  2005   L4-5/notes 03/06/2010  . POSTERIOR LUMBAR FUSION  2002  . TONSILLECTOMY  1960    OB History    No  data available       Home Medications    Prior to Admission medications   Medication Sig Start Date End Date Taking? Authorizing Provider  acetaminophen (TYLENOL) 500 MG tablet Take 500-1,000 mg by mouth every 8 (eight) hours as needed (for pain). Reported on 08/01/2015    Historical Provider, MD  albuterol (PROVENTIL HFA;VENTOLIN HFA) 108 (90 BASE) MCG/ACT inhaler Inhale 2 puffs into the lungs every 4 (four) hours as needed for wheezing or shortness of breath.     Historical Provider, MD  albuterol (PROVENTIL) (2.5 MG/3ML) 0.083% nebulizer solution Take 3 mLs (2.5 mg total) by nebulization every 4 (four) hours as needed for wheezing or shortness of breath. (may take 1 every 4 hours as needed for wheezing/shortness of breath) DX: 496 Patient taking differently: Take 2.5 mg by nebulization every 4 (four) hours as needed for wheezing or shortness of breath. DX: 496 01/09/14   [name], MD  alprazolam Prudy Feeler) 2 MG tablet Take 1-2 mg by mouth at bedtime.  06/21/15   Historical Provider, MD  aspirin EC 81 MG EC tablet Take 1 tablet (81 mg total) by mouth daily. 01/09/14   [name], MD  atorvastatin (LIPITOR) 20 MG tablet Take 20 mg by mouth daily. evening 09/14/14   Historical Provider, MD  clotrimazole (MYCELEX) 10 MG troche Take 1 tablet (10 mg total) by mouth 5 (five) times daily. 08/01/15   Kalman Shan, MD  DULoxetine (CYMBALTA) 60 MG capsule Take 60 mg by mouth daily. Reported on 08/01/2015    Historical Provider, MD  fluticasone (FLONASE) 50 MCG/ACT nasal spray Place 1 spray into both nostrils 2 (two) times daily. 12/23/14   Sharee Holster, NP  furosemide (LASIX) 40 MG tablet Take 40 mg by mouth every morning.    Historical Provider, MD  Hydrocod Polst-Chlorphen Polst [facility] PENNKINETIC ER PO) Take 5 mLs by mouth 2 (two) times daily as needed (cough).     Historical Provider, MD  HYDROcodone-acetaminophen (NORCO/VICODIN) 5-325 MG tablet Take 1 tablet by mouth every 6 (six) hours as  needed for moderate pain.    Historical Provider, MD  ipratropium-albuterol (DUONEB) 0.5-2.5 (3) MG/3ML SOLN INHALE 1 VAIL VIA NEBULIZER FOUR TIMES DAILY 08/04/15   Kalman Shan, MD  losartan (COZAAR) 50 MG tablet Take 50 mg by mouth daily.    Historical Provider, MD  metFORMIN (GLUCOPHAGE) 500 MG tablet TAKE 1 TABLET BY MOUTH TWICE DAILY 01/07/15   Sharee Holster, NP  Multiple Vitamin (MULTIVITAMIN) tablet Take 1 tablet by mouth daily. Reported on 08/01/2015    Historical Provider, MD  ONE TOUCH ULTRA TEST test strip As directed 04/24/15   Historical Provider, MD  pantoprazole (PROTONIX) 40 MG tablet TAKE 1 TABLET BY MOUTH EVERY NIGHT Patient taking differently: TAKE 1 TABLET BY MOUTH EVERY MORNING 01/07/15   Sharee Holster, NP  potassium chloride SA (K-DUR,KLOR-CON) 20 MEQ tablet Take 1 tablet (20 mEq total) by mouth daily. Take while on lasix 12/12/14  Jeanella Craze, NP  predniSONE (DELTASONE) 5 MG tablet Take 2 tablets (10 mg total) by mouth daily with breakfast. 07/12/15   Kathlen Mody, MD  SYMBICORT 160-4.5 MCG/ACT inhaler Inhale 2 puffs into the lungs 2 (two) times daily. 05/20/15   Historical Provider, MD    Family History Family History  Problem Relation Age of Onset  . Heart attack Sister 6    CABG  . Breast cancer Sister   . Cancer Brother   . Heart attack Brother   . Hypertension Other     all siblings  . Anesthesia problems Neg Hx     Social History Social History  Substance Use Topics  . Smoking status: Former Smoker    Packs/day: 0.25    Years: 56.00    Types: Cigarettes    Quit date: 01/27/2015  . Smokeless tobacco: Never Used  . Alcohol use 0.0 oz/week     Comment: h/o heavy ETOH quit 2004     Allergies   Brovana [arformoterol tartrate]; Budesonide; and Mucomyst [acetylcysteine]   Review of Systems Review of Systems  All other systems reviewed and are negative.    Physical Exam Updated Vital Signs BP 135/66   Pulse 100   Temp 98.5 F (36.9 C)    Resp (!) 28   SpO2 93%   Physical Exam  Nursing note and vitals reviewed.  70 year old female, resting comfortably and in no acute distress. Vital signs are significant for tachypnea. Oxygen saturation is 93%, which is normal. Head is normocephalic and atraumatic. PERRLA, EOMI. Oropharynx is clear. Neck is nontender and supple without adenopathy or JVD. Back is nontender and there is no CVA tenderness. Lungs have diffuse coarse wheezes diffusely. There are no rales or rhonchi. Chest is nontender. Heart has regular rate and rhythm without murmur. Abdomen is soft, flat, nontender without masses or hepatosplenomegaly and peristalsis is normoactive. Extremities have no cyanosis or edema, full range of motion is present. Skin is warm and dry without rash. Neurologic: Mental status is normal, cranial nerves are intact, there are no motor or sensory deficits.  ED Treatments / Results  Labs (all labs ordered are listed, but only abnormal results are displayed) Labs Reviewed  BASIC METABOLIC PANEL - Abnormal; Notable for the following:       Result Value   Chloride 99 (*)    Glucose, Bld 120 (*)    BUN 22 (*)    All other components within normal limits  CBC WITH DIFFERENTIAL/PLATELET - Abnormal; Notable for the following:    RBC 3.78 (*)    Hemoglobin 9.1 (*)    HCT 31.5 (*)    MCH 24.1 (*)    MCHC 28.9 (*)    RDW 16.0 (*)    All other components within normal limits  BLOOD GAS, ARTERIAL - Abnormal; Notable for the following:    pCO2 arterial 52.6 (*)    pO2, Arterial 71.2 (*)    Bicarbonate 33.1 (*)    Acid-Base Excess 8.0 (*)    All other components within normal limits    EKG  EKG Interpretation  Date/Time:  Wednesday September 10 2015 04:04:22 EDT Ventricular Rate:  103 PR Interval:    QRS Duration: 75 QT Interval:  372 QTC Calculation: 487 R Axis:   56 Text Interpretation:  Sinus tachycardia Low voltage, extremity leads Borderline prolonged QT interval When compared  with ECG of 06/30/2015, No significant change was found Confirmed by [facility]  MD, Daiwik Buffalo ([id number]) on 09/10/2015  4:15:51 AM       Radiology Dg Chest 2 View  Result Date: 09/10/2015 CLINICAL DATA:  Patient woke up with chest tightness and shortness of breath. Smoker. EXAM: CHEST  2 VIEW COMPARISON:  07/03/2015 FINDINGS: Hyperinflation consistent with emphysema. Normal heart size and pulmonary vascularity. No focal airspace disease or consolidation in the lungs. No blunting of costophrenic angles. No pneumothorax. Mediastinal contours appear intact. Degenerative changes in the spine. Calcification of the aorta. IMPRESSION: Emphysematous changes in the lungs. No evidence of active pulmonary disease. Electronically Signed   By: Burman NievesWilliam  Stevens M.D.   On: 09/10/2015 04:27    Procedures Procedures (including critical care time) CRITICAL CARE Performed by: UJWJX,BJYNWGLICK,Alveena Taira Total critical care time: 40 minutes Critical care time was exclusive of separately billable procedures and treating other patients. Critical care was necessary to treat or prevent imminent or life-threatening deterioration. Critical care was time spent personally by me on the following activities: development of treatment plan with patient and/or surrogate as well as nursing, discussions with consultants, evaluation of patient's response to treatment, examination of patient, obtaining history from patient or surrogate, ordering and performing treatments and interventions, ordering and review of laboratory studies, ordering and review of radiographic studies, pulse oximetry and re-evaluation of patient's condition.  Medications Ordered in ED Medications  ipratropium-albuterol (DUONEB) 0.5-2.5 (3) MG/3ML nebulizer solution 3 mL (3 mLs Nebulization Given 09/10/15 0547)  predniSONE (DELTASONE) tablet 60 mg (60 mg Oral Given 09/10/15 0547)  ipratropium-albuterol (DUONEB) 0.5-2.5 (3) MG/3ML nebulizer solution 3 mL (3 mLs Nebulization Given 09/10/15  [id number])     Initial Impression / Assessment and Plan / ED Course  I have reviewed the triage vital signs and the nursing notes.  Pertinent labs & imaging results that were available during my care of the patient were reviewed by me and considered in my medical decision making (see chart for details).  Clinical Course    COPD exacerbation. Chest x-ray shows no evidence of pneumonia. Old records are reviewed, and she has been hospitalized several times for COPD-most recently, June of this year. She did have hypercarbia without respiratory acidosis. PCO2 was in the 60-65 range. Because of this, oxygen is given very judiciously to today. She is given a dose of prednisone and is given a second nebulizer treatment. Will check ABG to make sure she is not becoming acidotic.  There was modest improvement with nebulizer treatment. PCO2 came back at 52.5 with normal pH. This is consistent with chronic respiratory acidosis with adequate compensation. She is given a third nebulizer treatment with additional modest improvement. However, she was still exhibiting significant wheezing and was not felt to be safe to go home. Case is discussed with Dr. Arbutus Leasat of [facility] who agrees to admit the patient under observation status.  Final Clinical Impressions(s) / ED Diagnoses   Final diagnoses:  COPD exacerbation (HCC)  Hypercarbia  Normocytic anemia    New Prescriptions New Prescriptions   No medications on file     Dione Boozeavid Gurkirat Basher, MD 09/10/15 [id number]

## 2015-09-10 NOTE — ED Notes (Signed)
Pt transported to XRay 

## 2015-09-10 NOTE — ED Notes (Signed)
SpO2 on room air is 86%

## 2015-09-11 DIAGNOSIS — Z9981 Dependence on supplemental oxygen: Secondary | ICD-10-CM | POA: Diagnosis not present

## 2015-09-11 DIAGNOSIS — J9621 Acute and chronic respiratory failure with hypoxia: Secondary | ICD-10-CM | POA: Diagnosis present

## 2015-09-11 DIAGNOSIS — I5032 Chronic diastolic (congestive) heart failure: Secondary | ICD-10-CM | POA: Diagnosis present

## 2015-09-11 DIAGNOSIS — I110 Hypertensive heart disease with heart failure: Secondary | ICD-10-CM | POA: Diagnosis present

## 2015-09-11 DIAGNOSIS — J441 Chronic obstructive pulmonary disease with (acute) exacerbation: Secondary | ICD-10-CM | POA: Diagnosis not present

## 2015-09-11 DIAGNOSIS — F419 Anxiety disorder, unspecified: Secondary | ICD-10-CM | POA: Diagnosis present

## 2015-09-11 DIAGNOSIS — J9620 Acute and chronic respiratory failure, unspecified whether with hypoxia or hypercapnia: Secondary | ICD-10-CM | POA: Diagnosis not present

## 2015-09-11 DIAGNOSIS — Z8249 Family history of ischemic heart disease and other diseases of the circulatory system: Secondary | ICD-10-CM | POA: Diagnosis not present

## 2015-09-11 DIAGNOSIS — J9622 Acute and chronic respiratory failure with hypercapnia: Secondary | ICD-10-CM | POA: Diagnosis present

## 2015-09-11 DIAGNOSIS — K224 Dyskinesia of esophagus: Secondary | ICD-10-CM | POA: Diagnosis present

## 2015-09-11 DIAGNOSIS — Z961 Presence of intraocular lens: Secondary | ICD-10-CM | POA: Diagnosis present

## 2015-09-11 DIAGNOSIS — G8929 Other chronic pain: Secondary | ICD-10-CM | POA: Diagnosis present

## 2015-09-11 DIAGNOSIS — Z803 Family history of malignant neoplasm of breast: Secondary | ICD-10-CM | POA: Diagnosis not present

## 2015-09-11 DIAGNOSIS — E1151 Type 2 diabetes mellitus with diabetic peripheral angiopathy without gangrene: Secondary | ICD-10-CM | POA: Diagnosis present

## 2015-09-11 DIAGNOSIS — Z9841 Cataract extraction status, right eye: Secondary | ICD-10-CM | POA: Diagnosis not present

## 2015-09-11 DIAGNOSIS — Z9842 Cataract extraction status, left eye: Secondary | ICD-10-CM | POA: Diagnosis not present

## 2015-09-11 DIAGNOSIS — M8580 Other specified disorders of bone density and structure, unspecified site: Secondary | ICD-10-CM | POA: Diagnosis present

## 2015-09-11 DIAGNOSIS — Z7984 Long term (current) use of oral hypoglycemic drugs: Secondary | ICD-10-CM | POA: Diagnosis not present

## 2015-09-11 DIAGNOSIS — Z888 Allergy status to other drugs, medicaments and biological substances status: Secondary | ICD-10-CM | POA: Diagnosis not present

## 2015-09-11 DIAGNOSIS — M549 Dorsalgia, unspecified: Secondary | ICD-10-CM | POA: Diagnosis present

## 2015-09-11 DIAGNOSIS — D638 Anemia in other chronic diseases classified elsewhere: Secondary | ICD-10-CM | POA: Diagnosis present

## 2015-09-11 DIAGNOSIS — R0602 Shortness of breath: Secondary | ICD-10-CM | POA: Diagnosis present

## 2015-09-11 DIAGNOSIS — Z87891 Personal history of nicotine dependence: Secondary | ICD-10-CM | POA: Diagnosis not present

## 2015-09-11 DIAGNOSIS — Z981 Arthrodesis status: Secondary | ICD-10-CM | POA: Diagnosis not present

## 2015-09-11 DIAGNOSIS — E785 Hyperlipidemia, unspecified: Secondary | ICD-10-CM | POA: Diagnosis present

## 2015-09-11 DIAGNOSIS — E559 Vitamin D deficiency, unspecified: Secondary | ICD-10-CM | POA: Diagnosis present

## 2015-09-11 LAB — CBC
HCT: 32.7 % — ABNORMAL LOW (ref 36.0–46.0)
HEMOGLOBIN: 9.9 g/dL — AB (ref 12.0–15.0)
MCH: 24.4 pg — AB (ref 26.0–34.0)
MCHC: 30.3 g/dL (ref 30.0–36.0)
MCV: 80.5 fL (ref 78.0–100.0)
PLATELETS: 275 10*3/uL (ref 150–400)
RBC: 4.06 MIL/uL (ref 3.87–5.11)
RDW: 16.1 % — AB (ref 11.5–15.5)
WBC: 7.5 10*3/uL (ref 4.0–10.5)

## 2015-09-11 LAB — BASIC METABOLIC PANEL
ANION GAP: 9 (ref 5–15)
BUN: 22 mg/dL — ABNORMAL HIGH (ref 6–20)
CALCIUM: 9.2 mg/dL (ref 8.9–10.3)
CO2: 30 mmol/L (ref 22–32)
Chloride: 99 mmol/L — ABNORMAL LOW (ref 101–111)
Creatinine, Ser: 0.69 mg/dL (ref 0.44–1.00)
GLUCOSE: 150 mg/dL — AB (ref 65–99)
Potassium: 4.1 mmol/L (ref 3.5–5.1)
SODIUM: 138 mmol/L (ref 135–145)

## 2015-09-11 LAB — GLUCOSE, CAPILLARY
GLUCOSE-CAPILLARY: 158 mg/dL — AB (ref 65–99)
Glucose-Capillary: 116 mg/dL — ABNORMAL HIGH (ref 65–99)
Glucose-Capillary: 99 mg/dL (ref 65–99)
Glucose-Capillary: 132 mg/dL — ABNORMAL HIGH (ref 65–99)

## 2015-09-11 MED ORDER — DOXYCYCLINE HYCLATE 100 MG PO TABS
100.0000 mg | ORAL_TABLET | Freq: Two times a day (BID) | ORAL | Status: DC
  Administered 2015-09-11 – 2015-09-16 (×11): 100 mg via ORAL
  Filled 2015-09-11 (×11): qty 1

## 2015-09-11 NOTE — Care Management Obs Status (Signed)
MEDICARE OBSERVATION STATUS NOTIFICATION   Patient Details  Name: Nicole Maxwell MRN: [id number] Date of Birth: November 14, 1945   Medicare Observation Status Notification Given:  Yes    Bartholome BillCLEMENTS, Kizer Nobbe H, RN 09/11/2015, 2:47 PM

## 2015-09-11 NOTE — Care Management Note (Signed)
Case Management Note  Patient Details  Name: Nicole Maxwell MRN: [id number] Date of Birth: 1945/08/17  Subjective/Objective:      70 yo admitted with COPD exacerbation              Action/Plan: Pt from home alone. [facility]  [facility], nebulizer and home 02. Pt states her Bipap is not working right and something is wrong with some of her 02 tanks.  [facility] DME rep contacted with pt concern. Pt was encouraged to [facility] to have it looked at or potentially replaced. [facility] DME rep also to have [facility] contact pt at discharge to discuss her 02 tank issues. [facility] CM will continue to follow.  Expected Discharge Date:   (unknown)               Expected Discharge Plan:  Home w Home Health Services  In-House Referral:     Discharge planning Services  CM Consult  Post Acute Care Choice:    Choice offered to:     DME Arranged:    DME Agency:     HH Arranged:    HH Agency:     Status of Service:  In process, will continue to follow  If discussed at Long Length of Stay Meetings, dates discussed:    Additional CommentsBartholome Bill:  Auguste Tebbetts H, RN 09/11/2015, 3:22 PM  [phone]

## 2015-09-11 NOTE — Progress Notes (Signed)
PROGRESS NOTE    Nicole Maxwell  ZOX:[id number] DOB: Nov 04, 1945 DOA: 09/10/2015 PCP: [name], William, MD  Brief Narrative: 70 y.o. female with medical history significant of ETOH, abuse, anemia, COPD, esophageal dysmotility, HTN, HLD, PVD, DM presenting w/ 3 days of worsening SOB and nonproductive cough. Home nebs w/ limited benefit. Worse w/ esertion.  Assessment & Plan:   Active Problems: COPD with acute exacerbation (HCC) - At this point we'll continue current medical regimen - I will continue Solu-Medrol at current dose and frequency.    Diabetes mellitus type 2, noninsulin dependent (HCC) - Relatively well controlled. Continue current regimen    Chronic diastolic congestive heart failure (HCC) - compensated currently continue current regimen    Essential hypertension - Patient is currently on losartan. Will continue at this juncture.    Tobacco use - We will encourage cessation   DVT prophylaxis: Lovenox Code Status: Full Family Communication: discussed with daughter Disposition Plan: Pending improvement in respiratory condition   Consultants:   none   Procedures: None   Antimicrobials: doxycycline   Subjective: Patient states that her breathing condition is about the same. It is not any worse.  Objective: Vitals:   09/10/15 2137 09/10/15 2210 09/11/15 0553 09/11/15 0806  BP:  137/69 (!) 164/72   Pulse: 96 96 92   Resp: 18 18 18    Temp:  97.6 F (36.4 C) 98 F (36.7 C)   TempSrc:  Oral Oral   SpO2: 99% 98% 100% 98%  Weight:      Height:        Intake/Output Summary (Last 24 hours) at 09/11/15 1440 Last data filed at 09/11/15 0900  Gross per 24 hour  Intake           774.33 ml  Output                0 ml  Net           774.33 ml   Filed Weights   09/10/15 0934  Weight: 51.3 kg (113 lb)    Examination:  General exam: Appears calm and comfortable, In no acute distress  Respiratory system: Poor inspiratory effort, expiratory wheeze mild,  equal chest rise Cardiovascular system: S1 & S2 heard, RRR. No JVD, murmurs, rubs, gallops or clicks. No pedal edema. Gastrointestinal system: Abdomen is nondistended, soft and nontender. No organomegaly or masses felt. Normal bowel sounds heard. Central nervous system: Alert and oriented. No focal neurological deficits. Extremities: Symmetric 5 x 5 power. Skin: No rashes, lesions or ulcers Psychiatry: Judgement and insight appear normal. Mood & affect appropriate.   Data Reviewed: I have personally reviewed following labs and imaging studies  CBC:  Recent Labs Lab 09/10/15 0545 09/11/15 0601  WBC 9.4 7.5  NEUTROABS 7.3  --   HGB 9.1* 9.9*  HCT 31.5* 32.7*  MCV 83.3 80.5  PLT 245 275   Basic Metabolic Panel:  Recent Labs Lab 09/10/15 0545 09/11/15 0601  NA 140 138  K 4.5 4.1  CL 99* 99*  CO2 32 30  GLUCOSE 120* 150*  BUN 22* 22*  CREATININE 0.62 0.69  CALCIUM 9.1 9.2   GFR: Estimated Creatinine Clearance: 52.2 mL/min (by C-G formula based on SCr of 0.8 mg/dL). Liver Function Tests: No results for input(s): AST, ALT, ALKPHOS, BILITOT, PROT, ALBUMIN in the last 168 hours. No results for input(s): LIPASE, AMYLASE in the last 168 hours. No results for input(s): AMMONIA in the last 168 hours. Coagulation Profile: No results for input(s):  INR, PROTIME in the last 168 hours. Cardiac Enzymes: No results for input(s): CKTOTAL, CKMB, CKMBINDEX, TROPONINI in the last 168 hours. BNP (last 3 results) No results for input(s): PROBNP in the last 8760 hours. HbA1C: No results for input(s): HGBA1C in the last 72 hours. CBG:  Recent Labs Lab 09/10/15 1304 09/10/15 1642 09/10/15 2205 09/11/15 0739 09/11/15 1145  GLUCAP 202* 166* 202* 116* 99   Lipid Profile: No results for input(s): CHOL, HDL, LDLCALC, TRIG, CHOLHDL, LDLDIRECT in the last 72 hours. Thyroid Function Tests: No results for input(s): TSH, T4TOTAL, FREET4, T3FREE, THYROIDAB in the last 72 hours. Anemia  Panel: No results for input(s): VITAMINB12, FOLATE, FERRITIN, TIBC, IRON, RETICCTPCT in the last 72 hours. Sepsis Labs: No results for input(s): PROCALCITON, LATICACIDVEN in the last 168 hours.  No results found for this or any previous visit (from the past 240 hour(s)).    Radiology Studies: Dg Chest 2 View  Result Date: 09/10/2015 CLINICAL DATA:  Patient woke up with chest tightness and shortness of breath. Smoker. EXAM: CHEST  2 VIEW COMPARISON:  07/03/2015 FINDINGS: Hyperinflation consistent with emphysema. Normal heart size and pulmonary vascularity. No focal airspace disease or consolidation in the lungs. No blunting of costophrenic angles. No pneumothorax. Mediastinal contours appear intact. Degenerative changes in the spine. Calcification of the aorta. IMPRESSION: Emphysematous changes in the lungs. No evidence of active pulmonary disease. Electronically Signed   By: Burman NievesWilliam  Stevens M.D.   On: 09/10/2015 04:27        Scheduled Meds: . alprazolam  0.25 mg Oral QHS  . aspirin EC  81 mg Oral Daily  . atorvastatin  20 mg Oral QPM  . doxycycline  100 mg Oral Q12H  . DULoxetine  60 mg Oral Daily  . enoxaparin (LOVENOX) injection  40 mg Subcutaneous Q24H  . furosemide  20 mg Oral Daily  . guaiFENesin  1,200 mg Oral BID  . insulin aspart  0-5 Units Subcutaneous QHS  . insulin aspart  0-9 Units Subcutaneous TID WC  . ipratropium-albuterol  3 mL Nebulization TID  . losartan  50 mg Oral Daily  . methylPREDNISolone (SOLU-MEDROL) injection  60 mg Intravenous Q6H  . mometasone-formoterol  2 puff Inhalation BID  . potassium chloride SA  20 mEq Oral Daily  . sodium chloride flush  3 mL Intravenous Q12H   Continuous Infusions:    LOS: 0 days    Time spent: > 35 minutes    Penny PiaVEGA, Noelly Lasseigne, MD [facility] Pager [phone]  If 7PM-7AM, please contact night-coverage [email or web address] Password [email or web address] 09/11/2015, 2:40 PM

## 2015-09-11 NOTE — Progress Notes (Signed)
PHARMACIST - PHYSICIAN COMMUNICATION DR:   Cena BentonVEGA CONCERNING: Antibiotic IV to Oral Route Change Policy  RECOMMENDATION: This patient is receiving doxycycline by the intravenous route.  Based on criteria approved by the Pharmacy and Therapeutics Committee, the antibiotic(s) is/are being converted to the equivalent oral dose form(s).  DESCRIPTION: These criteria include:  Patient being treated for a respiratory tract infection, urinary tract infection, cellulitis or clostridium difficile associated diarrhea if on metronidazole  The patient is not neutropenic and does not exhibit a GI malabsorption state  The patient is eating (either orally or via tube) and/or has been taking other orally administered medications for a least 24 hours  The patient is improving clinically and has a Tmax < 100.5  If you have questions about this conversion, please contact the Pharmacy Department  []   [phone]( [phone]  [facility] []   [phone]  [facility] []   [phone]( [phone] )  [facility] []   [phone] )  [facility] [x]   [phone]( [phone] )  [facility]   Durward MallardMichael Li, PharmD Candidate  Haynes Hoehnolleen Romana Deaton, PharmD, BCPS 09/11/2015, 9:52 AM  Pager: [phone]

## 2015-09-11 NOTE — Progress Notes (Signed)
LCSWA consulted for COPD patient. LCSWA met with patient at bedside and completed PHQ9 Depression Screening.  Patient was agreeable to answer questions. Patient reports she often has little intersest in activities, and feels down. She reports feeling this way since her husbands death two years ago. Patient reports a strong support system (daughter). But wishes her other children where more involved, and wishes she could do more. Patient reports she feels bad that she cannot get out and do more things with her family. Patient denies SI/HI.  Patient scored: 7, Mild Depression.  Patient receptive to resources and emotional support.  Patient does not report any concerns at this time.    Kathrin Greathouse, Latanya Presser, MSW Clinical Social Worker 5E and Psychiatric Service Line [phone] 09/11/2015  12:02 PM

## 2015-09-12 DIAGNOSIS — J441 Chronic obstructive pulmonary disease with (acute) exacerbation: Principal | ICD-10-CM

## 2015-09-12 LAB — GLUCOSE, CAPILLARY
GLUCOSE-CAPILLARY: 129 mg/dL — AB (ref 65–99)
GLUCOSE-CAPILLARY: 125 mg/dL — AB (ref 65–99)
Glucose-Capillary: 126 mg/dL — ABNORMAL HIGH (ref 65–99)
Glucose-Capillary: 138 mg/dL — ABNORMAL HIGH (ref 65–99)

## 2015-09-12 MED ORDER — BENZONATATE 100 MG PO CAPS
100.0000 mg | ORAL_CAPSULE | Freq: Three times a day (TID) | ORAL | Status: DC | PRN
  Administered 2015-09-12: 100 mg via ORAL
  Filled 2015-09-12: qty 1

## 2015-09-12 MED ORDER — IPRATROPIUM BROMIDE 0.02 % IN SOLN
RESPIRATORY_TRACT | Status: AC
  Filled 2015-09-12: qty 2.5

## 2015-09-12 NOTE — Progress Notes (Signed)
PROGRESS NOTE    Nicole Maxwell  WUJ:[id number] DOB: [birth year] DOA: 09/10/2015 PCP: [name], William, MD  Brief Narrative: 70 y.o. female with medical history significant of ETOH, abuse, anemia, COPD, esophageal dysmotility, HTN, HLD, PVD, DM presenting w/ 3 days of worsening SOB and nonproductive cough. Home nebs w/ limited benefit. Worse w/ esertion.  Assessment & Plan:   Active Problems: COPD with acute exacerbation (HCC) - At this point we'll continue current medical regimen - I will continue Solu-Medrol at current dose and frequency as patient still has significant wheezes    Diabetes mellitus type 2, noninsulin dependent (HCC) - Relatively well controlled. Continue current regimen    Chronic diastolic congestive heart failure (HCC) - compensated currently continue current regimen    Essential hypertension - Patient is currently on losartan. Will continue at this juncture.    Tobacco use - We will encourage cessation   DVT prophylaxis: Lovenox Code Status: Full Family Communication: discussed with daughter Disposition Plan: Pending improvement in respiratory condition   Consultants:   none   Procedures: None   Antimicrobials: doxycycline   Subjective: Patient states that her breathing condition is better. Still complaining of cough and would like an anti tussive  Objective: Vitals:   09/12/15 0627 09/12/15 0734 09/12/15 1000 09/12/15 1326  BP: (!) 152/70  (!) 153/70 139/63  Pulse: 92  (!) 104 (!) 101  Resp: 18  17 (!) 21  Temp:   98.6 F (37 C) 98.6 F (37 C)  TempSrc:   Oral Oral  SpO2: 92% 98% 98% 100%  Weight:      Height:        Intake/Output Summary (Last 24 hours) at 09/12/15 1501 Last data filed at 09/12/15 1211  Gross per 24 hour  Intake              240 ml  Output              350 ml  Net             -110 ml   Filed Weights   09/10/15 0934  Weight: 51.3 kg (113 lb)    Examination:  General exam: Appears calm and comfortable, In no  acute distress  Respiratory system: Poor inspiratory effort, expiratory wheeze mild, equal chest rise Cardiovascular system: S1 & S2 heard, RRR. No JVD, murmurs, rubs, gallops or clicks. No pedal edema. Gastrointestinal system: Abdomen is nondistended, soft and nontender. No organomegaly or masses felt. Normal bowel sounds heard. Central nervous system: Alert and oriented. No focal neurological deficits. Extremities: Symmetric 5 x 5 power. Skin: No rashes, lesions or ulcers Psychiatry: Judgement and insight appear normal. Mood & affect appropriate.   Data Reviewed: I have personally reviewed following labs and imaging studies  CBC:  Recent Labs Lab 09/10/15 0545 09/11/15 0601  WBC 9.4 7.5  NEUTROABS 7.3  --   HGB 9.1* 9.9*  HCT 31.5* 32.7*  MCV 83.3 80.5  PLT 245 275   Basic Metabolic Panel:  Recent Labs Lab 09/10/15 0545 09/11/15 0601  NA 140 138  K 4.5 4.1  CL 99* 99*  CO2 32 30  GLUCOSE 120* 150*  BUN 22* 22*  CREATININE 0.62 0.69  CALCIUM 9.1 9.2   GFR: Estimated Creatinine Clearance: 52.2 mL/min (by C-G formula based on SCr of 0.8 mg/dL). Liver Function Tests: No results for input(s): AST, ALT, ALKPHOS, BILITOT, PROT, ALBUMIN in the last 168 hours. No results for input(s): LIPASE, AMYLASE in the last 168 hours.  No results for input(s): AMMONIA in the last 168 hours. Coagulation Profile: No results for input(s): INR, PROTIME in the last 168 hours. Cardiac Enzymes: No results for input(s): CKTOTAL, CKMB, CKMBINDEX, TROPONINI in the last 168 hours. BNP (last 3 results) No results for input(s): PROBNP in the last 8760 hours. HbA1C: No results for input(s): HGBA1C in the last 72 hours. CBG:  Recent Labs Lab 09/11/15 1145 09/11/15 1713 09/11/15 2119 09/12/15 0738 09/12/15 1146  GLUCAP 99 132* 158* 129* 125*   Lipid Profile: No results for input(s): CHOL, HDL, LDLCALC, TRIG, CHOLHDL, LDLDIRECT in the last 72 hours. Thyroid Function Tests: No results  for input(s): TSH, T4TOTAL, FREET4, T3FREE, THYROIDAB in the last 72 hours. Anemia Panel: No results for input(s): VITAMINB12, FOLATE, FERRITIN, TIBC, IRON, RETICCTPCT in the last 72 hours. Sepsis Labs: No results for input(s): PROCALCITON, LATICACIDVEN in the last 168 hours.  No results found for this or any previous visit (from the past 240 hour(s)).    Radiology Studies: No results found.      Scheduled Meds: . alprazolam  0.25 mg Oral QHS  . aspirin EC  81 mg Oral Daily  . atorvastatin  20 mg Oral QPM  . doxycycline  100 mg Oral Q12H  . DULoxetine  60 mg Oral Daily  . enoxaparin (LOVENOX) injection  40 mg Subcutaneous Q24H  . furosemide  20 mg Oral Daily  . guaiFENesin  1,200 mg Oral BID  . insulin aspart  0-5 Units Subcutaneous QHS  . insulin aspart  0-9 Units Subcutaneous TID WC  . ipratropium-albuterol  3 mL Nebulization TID  . losartan  50 mg Oral Daily  . methylPREDNISolone (SOLU-MEDROL) injection  60 mg Intravenous Q6H  . mometasone-formoterol  2 puff Inhalation BID  . potassium chloride SA  20 mEq Oral Daily  . sodium chloride flush  3 mL Intravenous Q12H   Continuous Infusions:    LOS: 1 day   Time spent: > 35 minutes  Penny PiaVEGA, Ryo Klang, MD [facility] Pager [phone]  If 7PM-7AM, please contact night-coverage [email or web address] Password [email or web address] 09/12/2015, 3:01 PM

## 2015-09-12 NOTE — Progress Notes (Signed)
Pt refused BiPAP qhs.  Education provided to the Pt.  RT will continue to monitor as needed.

## 2015-09-13 LAB — GLUCOSE, CAPILLARY
GLUCOSE-CAPILLARY: 160 mg/dL — AB (ref 65–99)
Glucose-Capillary: 108 mg/dL — ABNORMAL HIGH (ref 65–99)
Glucose-Capillary: 113 mg/dL — ABNORMAL HIGH (ref 65–99)
Glucose-Capillary: 143 mg/dL — ABNORMAL HIGH (ref 65–99)

## 2015-09-13 NOTE — Progress Notes (Signed)
PROGRESS NOTE    Nicole Maxwell  ZOX:[id number] DOB: February 10, 1945 DOA: 09/10/2015 PCP: [name], William, MD  Brief Narrative: 70 y.o. female with medical history significant of ETOH, abuse, anemia, COPD, esophageal dysmotility, HTN, HLD, PVD, DM presenting w/ 3 days of worsening SOB and nonproductive cough. Home nebs w/ limited benefit. Worse w/ exertion  Assessment & Plan:   Active Problems: COPD with acute exacerbation (HCC) - At this point we'll continue current medical regimen - Patient has slow improvement on this regimen. Will consider prednisone taper next a.m.    Diabetes mellitus type 2, noninsulin dependent (HCC) - Relatively well controlled. Continue current regimen    Chronic diastolic congestive heart failure (HCC) - compensated currently continue current regimen    Essential hypertension - Patient is currently on losartan. Will continue at this juncture.    Tobacco use - We will encourage cessation   DVT prophylaxis: Lovenox Code Status: Full Family Communication: discussed with daughter Disposition Plan: Pending improvement in respiratory condition   Consultants:   none   Procedures: None   Antimicrobials: doxycycline   Subjective: Patient reports continued improvement in condition but still not at baseline  Objective: Vitals:   09/12/15 2103 09/12/15 2106 09/13/15 0454 09/13/15 0834  BP:   (!) 156/74   Pulse:   81   Resp:   19   Temp:   98.6 F (37 C)   TempSrc:   Oral   SpO2: 99% 99% 100% 95%  Weight:      Height:        Intake/Output Summary (Last 24 hours) at 09/13/15 1041 Last data filed at 09/12/15 1700  Gross per 24 hour  Intake              480 ml  Output              700 ml  Net             -220 ml   Filed Weights   09/10/15 0934  Weight: 51.3 kg (113 lb)    Examination:  General exam: Appears calm and comfortable, In no acute distress  Respiratory system: Poor inspiratory effort,  equal chest rise, no  wheezes Cardiovascular system: S1 & S2 heard, RRR. No JVD, murmurs, rubs, gallops or clicks. No pedal edema. Gastrointestinal system: Abdomen is nondistended, soft and nontender. No organomegaly or masses felt. Normal bowel sounds heard. Central nervous system: Alert and oriented. No focal neurological deficits. Extremities: Symmetric 5 x 5 power. Skin: No rashes, lesions or ulcers Psychiatry: Judgement and insight appear normal. Mood & affect appropriate.   Data Reviewed: I have personally reviewed following labs and imaging studies  CBC:  Recent Labs Lab 09/10/15 0545 09/11/15 0601  WBC 9.4 7.5  NEUTROABS 7.3  --   HGB 9.1* 9.9*  HCT 31.5* 32.7*  MCV 83.3 80.5  PLT 245 275   Basic Metabolic Panel:  Recent Labs Lab 09/10/15 0545 09/11/15 0601  NA 140 138  K 4.5 4.1  CL 99* 99*  CO2 32 30  GLUCOSE 120* 150*  BUN 22* 22*  CREATININE 0.62 0.69  CALCIUM 9.1 9.2   GFR: Estimated Creatinine Clearance: 52.2 mL/min (by C-G formula based on SCr of 0.8 mg/dL). Liver Function Tests: No results for input(s): AST, ALT, ALKPHOS, BILITOT, PROT, ALBUMIN in the last 168 hours. No results for input(s): LIPASE, AMYLASE in the last 168 hours. No results for input(s): AMMONIA in the last 168 hours. Coagulation Profile: No results for input(s):  INR, PROTIME in the last 168 hours. Cardiac Enzymes: No results for input(s): CKTOTAL, CKMB, CKMBINDEX, TROPONINI in the last 168 hours. BNP (last 3 results) No results for input(s): PROBNP in the last 8760 hours. HbA1C: No results for input(s): HGBA1C in the last 72 hours. CBG:  Recent Labs Lab 09/12/15 0738 09/12/15 1146 09/12/15 1643 09/12/15 2257 09/13/15 0723  GLUCAP 129* 125* 126* 138* 108*   Lipid Profile: No results for input(s): CHOL, HDL, LDLCALC, TRIG, CHOLHDL, LDLDIRECT in the last 72 hours. Thyroid Function Tests: No results for input(s): TSH, T4TOTAL, FREET4, T3FREE, THYROIDAB in the last 72 hours. Anemia Panel: No  results for input(s): VITAMINB12, FOLATE, FERRITIN, TIBC, IRON, RETICCTPCT in the last 72 hours. Sepsis Labs: No results for input(s): PROCALCITON, LATICACIDVEN in the last 168 hours.  No results found for this or any previous visit (from the past 240 hour(s)).    Radiology Studies: No results found.      Scheduled Meds: . alprazolam  0.25 mg Oral QHS  . aspirin EC  81 mg Oral Daily  . atorvastatin  20 mg Oral QPM  . doxycycline  100 mg Oral Q12H  . DULoxetine  60 mg Oral Daily  . enoxaparin (LOVENOX) injection  40 mg Subcutaneous Q24H  . furosemide  20 mg Oral Daily  . guaiFENesin  1,200 mg Oral BID  . insulin aspart  0-5 Units Subcutaneous QHS  . insulin aspart  0-9 Units Subcutaneous TID WC  . ipratropium-albuterol  3 mL Nebulization TID  . losartan  50 mg Oral Daily  . methylPREDNISolone (SOLU-MEDROL) injection  60 mg Intravenous Q6H  . mometasone-formoterol  2 puff Inhalation BID  . potassium chloride SA  20 mEq Oral Daily  . sodium chloride flush  3 mL Intravenous Q12H   Continuous Infusions:    LOS: 2 days   Time spent: > 35 minutes  Penny PiaVEGA, Aza Dantes, MD [facility] Pager [phone]  If 7PM-7AM, please contact night-coverage [email or web address] Password [email or web address] 09/13/2015, 10:41 AM

## 2015-09-14 LAB — GLUCOSE, CAPILLARY
GLUCOSE-CAPILLARY: 120 mg/dL — AB (ref 65–99)
Glucose-Capillary: 144 mg/dL — ABNORMAL HIGH (ref 65–99)
Glucose-Capillary: 235 mg/dL — ABNORMAL HIGH (ref 65–99)
Glucose-Capillary: 168 mg/dL — ABNORMAL HIGH (ref 65–99)

## 2015-09-14 NOTE — Progress Notes (Signed)
PROGRESS NOTE    Nicole Maxwell  WUJ:[id number] DOB: [birth year] DOA: 09/10/2015 PCP: [name], William, MD  Brief Narrative: 70 y.o. female with medical history significant of ETOH, abuse, anemia, COPD, esophageal dysmotility, HTN, HLD, PVD, DM presenting w/ 3 days of worsening SOB and nonproductive cough. Home nebs w/ limited benefit. Worse w/ exertion  Assessment & Plan:   Active Problems: COPD with acute exacerbation (HCC) - still wheezing, Improvement has been slow. Will continue current medical regimen.  - decrease steroid regimen.    Diabetes mellitus type 2, noninsulin dependent (HCC) - Relatively well controlled. Continue current regimen    Chronic diastolic congestive heart failure (HCC) - compensated currently continue current regimen    Essential hypertension - Patient is currently on losartan. Will continue at this juncture.    Tobacco use - We will encourage cessation   DVT prophylaxis: Lovenox Code Status: Full Family Communication: discussed with patient directly  Disposition Plan: Possible d/c home next am.   Consultants:   none   Procedures: None   Antimicrobials: doxycycline   Subjective: Patient reports improvement in condition. No new complaints.   Objective: Vitals:   09/14/15 0632 09/14/15 0910 09/14/15 1309 09/14/15 1515  BP: 138/76  (!) 163/76   Pulse: 85  95   Resp:   16   Temp:   98.5 F (36.9 C)   TempSrc:   Oral   SpO2:  100% 100% 95%  Weight:      Height:        Intake/Output Summary (Last 24 hours) at 09/14/15 1541 Last data filed at 09/13/15 1833  Gross per 24 hour  Intake              240 ml  Output                0 ml  Net              240 ml   Filed Weights   09/10/15 0934  Weight: 51.3 kg (113 lb)    Examination:  General exam: Appears calm and comfortable, In no acute distress  Respiratory system: Poor inspiratory effort,  equal chest rise, no wheezes Cardiovascular system: S1 & S2 heard, RRR. No JVD, murmurs,  rubs, gallops or clicks. No pedal edema. Gastrointestinal system: Abdomen is nondistended, soft and nontender. No organomegaly or masses felt. Normal bowel sounds heard. Central nervous system: Alert and oriented. No focal neurological deficits. Extremities: Symmetric 5 x 5 power. Skin: No rashes, lesions or ulcers Psychiatry: Judgement and insight appear normal. Mood & affect appropriate.   Data Reviewed: I have personally reviewed following labs and imaging studies  CBC:  Recent Labs Lab 09/10/15 0545 09/11/15 0601  WBC 9.4 7.5  NEUTROABS 7.3  --   HGB 9.1* 9.9*  HCT 31.5* 32.7*  MCV 83.3 80.5  PLT 245 275   Basic Metabolic Panel:  Recent Labs Lab 09/10/15 0545 09/11/15 0601  NA 140 138  K 4.5 4.1  CL 99* 99*  CO2 32 30  GLUCOSE 120* 150*  BUN 22* 22*  CREATININE 0.62 0.69  CALCIUM 9.1 9.2   GFR: Estimated Creatinine Clearance: 52.2 mL/min (by C-G formula based on SCr of 0.8 mg/dL). Liver Function Tests: No results for input(s): AST, ALT, ALKPHOS, BILITOT, PROT, ALBUMIN in the last 168 hours. No results for input(s): LIPASE, AMYLASE in the last 168 hours. No results for input(s): AMMONIA in the last 168 hours. Coagulation Profile: No results for input(s): INR, PROTIME in  the last 168 hours. Cardiac Enzymes: No results for input(s): CKTOTAL, CKMB, CKMBINDEX, TROPONINI in the last 168 hours. BNP (last 3 results) No results for input(s): PROBNP in the last 8760 hours. HbA1C: No results for input(s): HGBA1C in the last 72 hours. CBG:  Recent Labs Lab 09/13/15 1139 09/13/15 1657 09/13/15 2151 09/14/15 0715 09/14/15 1150  GLUCAP 113* 143* 160* 144* 235*   Lipid Profile: No results for input(s): CHOL, HDL, LDLCALC, TRIG, CHOLHDL, LDLDIRECT in the last 72 hours. Thyroid Function Tests: No results for input(s): TSH, T4TOTAL, FREET4, T3FREE, THYROIDAB in the last 72 hours. Anemia Panel: No results for input(s): VITAMINB12, FOLATE, FERRITIN, TIBC, IRON,  RETICCTPCT in the last 72 hours. Sepsis Labs: No results for input(s): PROCALCITON, LATICACIDVEN in the last 168 hours.  No results found for this or any previous visit (from the past 240 hour(s)).    Radiology Studies: No results found.      Scheduled Meds: . alprazolam  0.25 mg Oral QHS  . aspirin EC  81 mg Oral Daily  . atorvastatin  20 mg Oral QPM  . doxycycline  100 mg Oral Q12H  . DULoxetine  60 mg Oral Daily  . enoxaparin (LOVENOX) injection  40 mg Subcutaneous Q24H  . furosemide  20 mg Oral Daily  . guaiFENesin  1,200 mg Oral BID  . insulin aspart  0-5 Units Subcutaneous QHS  . insulin aspart  0-9 Units Subcutaneous TID WC  . ipratropium-albuterol  3 mL Nebulization TID  . losartan  50 mg Oral Daily  . methylPREDNISolone (SOLU-MEDROL) injection  60 mg Intravenous Q6H  . mometasone-formoterol  2 puff Inhalation BID  . potassium chloride SA  20 mEq Oral Daily  . sodium chloride flush  3 mL Intravenous Q12H   Continuous Infusions:    LOS: 3 days   Time spent: > 35 minutes  Penny PiaVEGA, Comfort Iversen, MD [facility] Pager [phone]  If 7PM-7AM, please contact night-coverage [email or web address] Password [email or web address] 09/14/2015, 3:41 PM

## 2015-09-14 NOTE — Care Management Important Message (Signed)
Important Message  Patient Details  Name: Nicole Maxwell MRN: [id number] Date of Birth: [birth year]   Medicare Important Message Given:  Yes    Antony HasteBennett, Rafia Shedden Harris, RN 09/14/2015, 2:59 PM

## 2015-09-15 LAB — GLUCOSE, CAPILLARY
GLUCOSE-CAPILLARY: 124 mg/dL — AB (ref 65–99)
GLUCOSE-CAPILLARY: 148 mg/dL — AB (ref 65–99)
GLUCOSE-CAPILLARY: 127 mg/dL — AB (ref 65–99)
Glucose-Capillary: 109 mg/dL — ABNORMAL HIGH (ref 65–99)

## 2015-09-15 NOTE — Progress Notes (Signed)
PROGRESS NOTE    Nicole Maxwell  HYQ:[id number] DOB: 05/27/1945 DOA: 09/10/2015 PCP: [name], William, MD  Brief Narrative: 70 y.o. female with medical history significant of ETOH, abuse, anemia, COPD, esophageal dysmotility, HTN, HLD, PVD, DM presenting w/ 3 days of worsening SOB and nonproductive cough. Home nebs w/ limited benefit. Worse w/ exertion  Assessment & Plan:   Active Problems: COPD with acute exacerbation (HCC) - still wheezing, Improvement has been slow. Will continue current medical regimen.     Diabetes mellitus type 2, noninsulin dependent (HCC) - Relatively well controlled. Continue current regimen    Chronic diastolic congestive heart failure (HCC) - compensated currently continue current regimen    Essential hypertension - Patient is currently on losartan. Will continue at this juncture.    Tobacco use - We will encourage cessation   DVT prophylaxis: Lovenox Code Status: Full Family Communication: discussed with daughter Disposition Plan: Possible d/c home next am with continued improvement. D/c patient and daughter   Consultants:   none   Procedures: None   Antimicrobials: doxycycline   Subjective: No new complaints. No acute issues overnight.   Objective: Vitals:   09/14/15 2019 09/15/15 0508 09/15/15 0758 09/15/15 1432  BP: (!) 149/62 (!) 154/67  (!) 143/58  Pulse: 95 81  92  Resp: 17 18  19   Temp: 98.4 F (36.9 C) 98.2 F (36.8 C)  98.4 F (36.9 C)  TempSrc: Oral Oral  Oral  SpO2: 100% 100% 100% 99%  Weight:      Height:        Intake/Output Summary (Last 24 hours) at 09/15/15 1638 Last data filed at 09/15/15 1100  Gross per 24 hour  Intake              240 ml  Output                0 ml  Net              240 ml   Filed Weights   09/10/15 0934  Weight: 51.3 kg (113 lb)    Examination:  General exam: Appears calm and comfortable, In no acute distress  Respiratory system: Poor inspiratory effort,  equal chest rise, no  wheezes Cardiovascular system: S1 & S2 heard, RRR. No JVD, murmurs, rubs, gallops or clicks. No pedal edema. Gastrointestinal system: Abdomen is nondistended, soft and nontender. No organomegaly or masses felt. Normal bowel sounds heard. Central nervous system: Alert and oriented. No focal neurological deficits. Extremities: Symmetric 5 x 5 power. Skin: No rashes, lesions or ulcers Psychiatry: Judgement and insight appear normal. Mood & affect appropriate.   Data Reviewed: I have personally reviewed following labs and imaging studies  CBC:  Recent Labs Lab 09/10/15 0545 09/11/15 0601  WBC 9.4 7.5  NEUTROABS 7.3  --   HGB 9.1* 9.9*  HCT 31.5* 32.7*  MCV 83.3 80.5  PLT 245 275   Basic Metabolic Panel:  Recent Labs Lab 09/10/15 0545 09/11/15 0601  NA 140 138  K 4.5 4.1  CL 99* 99*  CO2 32 30  GLUCOSE 120* 150*  BUN 22* 22*  CREATININE 0.62 0.69  CALCIUM 9.1 9.2   GFR: Estimated Creatinine Clearance: 52.2 mL/min (by C-G formula based on SCr of 0.8 mg/dL). Liver Function Tests: No results for input(s): AST, ALT, ALKPHOS, BILITOT, PROT, ALBUMIN in the last 168 hours. No results for input(s): LIPASE, AMYLASE in the last 168 hours. No results for input(s): AMMONIA in the last 168 hours. Coagulation  Profile: No results for input(s): INR, PROTIME in the last 168 hours. Cardiac Enzymes: No results for input(s): CKTOTAL, CKMB, CKMBINDEX, TROPONINI in the last 168 hours. BNP (last 3 results) No results for input(s): PROBNP in the last 8760 hours. HbA1C: No results for input(s): HGBA1C in the last 72 hours. CBG:  Recent Labs Lab 09/14/15 1150 09/14/15 1723 09/14/15 2015 09/15/15 0736 09/15/15 1206  GLUCAP 235* 168* 120* 124* 148*   Lipid Profile: No results for input(s): CHOL, HDL, LDLCALC, TRIG, CHOLHDL, LDLDIRECT in the last 72 hours. Thyroid Function Tests: No results for input(s): TSH, T4TOTAL, FREET4, T3FREE, THYROIDAB in the last 72 hours. Anemia Panel: No  results for input(s): VITAMINB12, FOLATE, FERRITIN, TIBC, IRON, RETICCTPCT in the last 72 hours. Sepsis Labs: No results for input(s): PROCALCITON, LATICACIDVEN in the last 168 hours.  No results found for this or any previous visit (from the past 240 hour(s)).    Radiology Studies: No results found.      Scheduled Meds: . alprazolam  0.25 mg Oral QHS  . aspirin EC  81 mg Oral Daily  . atorvastatin  20 mg Oral QPM  . doxycycline  100 mg Oral Q12H  . DULoxetine  60 mg Oral Daily  . enoxaparin (LOVENOX) injection  40 mg Subcutaneous Q24H  . furosemide  20 mg Oral Daily  . guaiFENesin  1,200 mg Oral BID  . insulin aspart  0-5 Units Subcutaneous QHS  . insulin aspart  0-9 Units Subcutaneous TID WC  . ipratropium-albuterol  3 mL Nebulization TID  . losartan  50 mg Oral Daily  . methylPREDNISolone (SOLU-MEDROL) injection  60 mg Intravenous Q6H  . mometasone-formoterol  2 puff Inhalation BID  . potassium chloride SA  20 mEq Oral Daily  . sodium chloride flush  3 mL Intravenous Q12H   Continuous Infusions:    LOS: 4 days   Time spent: > 35 minutes  Penny PiaVEGA, Kamaryn Grimley, MD [facility] Pager [phone]  If 7PM-7AM, please contact night-coverage [email or web address] Password [email or web address] 09/15/2015, 4:38 PM

## 2015-09-15 NOTE — Evaluation (Signed)
Physical Therapy Evaluation Patient Details Name: Nicole Maxwell MRN: [id number] DOB: 11-15-1945 Today's Date: 09/15/2015   History of Present Illness  70 y.o. female with h/o COPD on 2 L O2 at home, chronic back pain, DM2, OA, HTN admitted with COPD exacerbation.   Clinical Impression  Pt is independent with mobility using a rolling walker, she ambulated 240' with RW without loss of balance. SaO2 95-99% on 2L O2 walking. No further PT indicated, will sign off.     Follow Up Recommendations No PT follow up    Equipment Recommendations  None recommended by PT    Recommendations for Other Services       Precautions / Restrictions Precautions Precautions: Other (comment) Precaution Comments: wears O2 24*, denies h/o falls in past 1 year Restrictions Weight Bearing Restrictions: No      Mobility  Bed Mobility Overal bed mobility: Independent                Transfers Overall transfer level: Modified independent Equipment used: Rolling walker (2 wheeled)                Ambulation/Gait Ambulation/Gait assistance: Modified independent (Device/Increase time) Ambulation Distance (Feet): 240 Feet Assistive device: Rolling walker (2 wheeled) Gait Pattern/deviations: WFL(Within Functional Limits)   Gait velocity interpretation: at or above normal speed for age/gender General Gait Details: steady with RW, 1/4 dyspnea on 2L O2, SaO2 94-99%  Stairs            Wheelchair Mobility    Modified Rankin (Stroke Patients Only)       Balance Overall balance assessment: Modified Independent                                           Pertinent Vitals/Pain Pain Assessment: No/denies pain    Home Living Family/patient expects to be discharged to:: Private residence Living Arrangements: Alone Available Help at Discharge: Family;Available PRN/intermittently Type of Home: House Home Access: Level entry     Home Layout: One level Home Equipment:  Walker - 2 wheels;Shower seat;Cane - single point;Grab bars - tub/shower      Prior Function Level of Independence: Independent with assistive device(s)         Comments: uses RW/cane prn, has seat and grab bars in shower     Hand Dominance   Dominant Hand: Right    Extremity/Trunk Assessment   Upper Extremity Assessment: Overall WFL for tasks assessed           Lower Extremity Assessment: RLE deficits/detail;LLE deficits/detail RLE Deficits / Details: decr sensation to light touch B feet, this is baseline; strength/AROM WFL BLEs    Cervical / Trunk Assessment: Normal  Communication   Communication: No difficulties  Cognition Arousal/Alertness: Awake/alert Behavior During Therapy: WFL for tasks assessed/performed Overall Cognitive Status: Within Functional Limits for tasks assessed                      General Comments      Exercises        Assessment/Plan    PT Assessment Patent does not need any further PT services  PT Diagnosis     PT Problem List    PT Treatment Interventions     PT Goals (Current goals can be found in the Care Plan section) Acute Rehab PT Goals PT Goal Formulation: All assessment and education complete, DC therapy  Frequency     Barriers to discharge        Co-evaluation               End of Session Equipment Utilized During Treatment: Gait belt;Oxygen Activity Tolerance: Patient tolerated treatment well Patient left: in chair;with call bell/phone within reach;with chair alarm set Nurse Communication: Mobility status         Time: [id number] PT Time Calculation (min) (ACUTE ONLY): 24 min   Charges:   PT Evaluation $PT Eval Low Complexity: 1 Procedure PT Treatments $Gait Training: 8-22 mins   PT G Codes:        Tamala SerUhlenberg, Nicole Maxwell 09/15/2015, 1:38 PM [phone]

## 2015-09-16 LAB — GLUCOSE, CAPILLARY
GLUCOSE-CAPILLARY: 163 mg/dL — AB (ref 65–99)
GLUCOSE-CAPILLARY: 155 mg/dL — AB (ref 65–99)

## 2015-09-16 MED ORDER — PREDNISONE 5 MG PO TABS: 10.0000 mg | ORAL_TABLET | ORAL | 0 refills | Status: DC

## 2015-09-16 NOTE — Discharge Summary (Signed)
Physician Discharge Summary  Nicole Maxwell [id number] DOB: 1945-11-15 DOA: 09/10/2015  PCP: [name], William, MD  Admit date: 09/10/2015 Discharge date: 09/16/2015  Time spent: > 35 minutes  Recommendations for Outpatient Follow-up:    Discharge Diagnoses:  Active Problems:   COPD exacerbation (HCC)   Diabetes mellitus type 2, noninsulin dependent (HCC)   COPD with acute exacerbation (HCC)   Acute on chronic respiratory failure (HCC)   Chronic diastolic congestive heart failure (HCC)   Essential hypertension   Tobacco use   Discharge Condition: stable  Diet recommendation:   Filed Weights   09/10/15 0934  Weight: 51.3 kg (113 lb)    History of present illness:  70 y.o.femalewith medical history significant of ETOH, abuse, anemia, COPD, esophageal dysmotility, HTN, HLD, PVD, DM presenting w/ 3 days of worsening SOB and nonproductive cough. Home nebs w/ limited benefit. Worse w/ exertion.  Hospital Course:  Active Problems: COPD with acute exacerbation (HCC) - continue Home medication regimen. Discharge on prednisone taper -Patient completed 7 days of doxycycline, no further antibiotics are required on discharge    Diabetes mellitus type 2, noninsulin dependent (HCC) - Relatively well controlled. Continue current regimen    Chronic diastolic congestive heart failure (HCC) - compensated currently continue current regimen    Essential hypertension - Patient is currently on losartan. Will continue at this juncture.    Tobacco use - We will encourage cessation   Procedures:  None  Consultations:  None  Discharge Exam: Vitals:   09/15/15 2057 09/16/15 0529  BP:  (!) 148/58  Pulse: 93 79  Resp: 18 18  Temp:  98.5 F (36.9 C)    General: Patient in no acute distress, alert and awake Cardiovascular: Regular rate and rhythm, no murmurs rubs Respiratory: No increased work of breathing, no wheezes  Discharge Instructions   Discharge Instructions    Call MD for:  difficulty breathing, headache or visual disturbances    Complete by:  As directed   Call MD for:  severe uncontrolled pain    Complete by:  As directed   Call MD for:  temperature >100.4    Complete by:  As directed   Diet - low sodium heart healthy    Complete by:  As directed   Discharge instructions    Complete by:  As directed   Please be sure to abstain from tobacco use. Follow-up with your primary care doctor in the next one or 2 weeks.   Increase activity slowly    Complete by:  As directed     Current Discharge Medication List    CONTINUE these medications which have CHANGED   Details  predniSONE (DELTASONE) 5 MG tablet Take 2 tablets (10 mg total) by mouth as directed. Take 40 mg daily for the next 3 days, then take 30 mg daily for the next 3 days, then take 20 mg daily for the next 3 days, then take 10 mg daily Qty: 40 tablet, Refills: 0      CONTINUE these medications which have NOT CHANGED   Details  acetaminophen (TYLENOL) 500 MG tablet Take 500-1,000 mg by mouth every 8 (eight) hours as needed (for pain). Reported on 08/01/2015    albuterol (PROVENTIL HFA;VENTOLIN HFA) 108 (90 BASE) MCG/ACT inhaler Inhale 2 puffs into the lungs every 6 (six) hours as needed for wheezing or shortness of breath.     alprazolam (XANAX) 2 MG tablet Take 0.25 mg by mouth at bedtime.  Refills: 2    aspirin  EC 81 MG EC tablet Take 1 tablet (81 mg total) by mouth daily.    atorvastatin (LIPITOR) 20 MG tablet Take 20 mg by mouth every evening.  Refills: 6    DULoxetine (CYMBALTA) 60 MG capsule Take 60 mg by mouth daily. Reported on 08/01/2015    feeding supplement, GLUCERNA SHAKE, (GLUCERNA SHAKE) LIQD Take 237 mLs by mouth 2 (two) times daily.     fluticasone (FLONASE) 50 MCG/ACT nasal spray Place 1 spray into both nostrils 2 (two) times daily. Qty: 50 g, Refills: prn   Associated Diagnoses: COPD with acute exacerbation (HCC)    furosemide (LASIX) 20 MG tablet Take 20 mg by  mouth every morning.    HYDROcodone-acetaminophen (NORCO/VICODIN) 5-325 MG tablet Take 1 tablet by mouth every 6 (six) hours as needed for moderate pain.    losartan (COZAAR) 50 MG tablet Take 50 mg by mouth daily.    metFORMIN (GLUCOPHAGE) 500 MG tablet TAKE 1 TABLET BY MOUTH TWICE DAILY Qty: 180 tablet, Refills: 0    Multiple Vitamin (MULTIVITAMIN) tablet Take 1 tablet by mouth daily. Reported on 08/01/2015    pantoprazole (PROTONIX) 40 MG tablet TAKE 1 TABLET BY MOUTH EVERY NIGHT Qty: 90 tablet, Refills: 0    potassium chloride SA (K-DUR,KLOR-CON) 20 MEQ tablet Take 1 tablet (20 mEq total) by mouth daily. Take while on lasix Qty: 30 tablet, Refills: 0    SYMBICORT 160-4.5 MCG/ACT inhaler Inhale 2 puffs into the lungs 2 (two) times daily. Refills: 3    albuterol (PROVENTIL) (2.5 MG/3ML) 0.083% nebulizer solution Take 3 mLs (2.5 mg total) by nebulization every 4 (four) hours as needed for wheezing or shortness of breath. (may take 1 every 4 hours as needed for wheezing/shortness of breath) DX: 496 Qty: 60 vial, Refills: 12      STOP taking these medications     ipratropium-albuterol (DUONEB) 0.5-2.5 (3) MG/3ML SOLN      clotrimazole (MYCELEX) 10 MG troche        Allergies  Allergen Reactions  . Brovana [Arformoterol Tartrate] Shortness Of Breath and Cough    General intolerance  . Budesonide Shortness Of Breath and Cough    General intolerance  . Mucomyst [Acetylcysteine] Shortness Of Breath      The results of significant diagnostics from this hospitalization (including imaging, microbiology, ancillary and laboratory) are listed below for reference.    Significant Diagnostic Studies: Dg Chest 2 View  Result Date: 09/10/2015 CLINICAL DATA:  Patient woke up with chest tightness and shortness of breath. Smoker. EXAM: CHEST  2 VIEW COMPARISON:  07/03/2015 FINDINGS: Hyperinflation consistent with emphysema. Normal heart size and pulmonary vascularity. No focal airspace  disease or consolidation in the lungs. No blunting of costophrenic angles. No pneumothorax. Mediastinal contours appear intact. Degenerative changes in the spine. Calcification of the aorta. IMPRESSION: Emphysematous changes in the lungs. No evidence of active pulmonary disease. Electronically Signed   By: Burman Nieves M.D.   On: 09/10/2015 04:27    Microbiology: No results found for this or any previous visit (from the past 240 hour(s)).   Labs: Basic Metabolic Panel:  Recent Labs Lab 09/10/15 0545 09/11/15 0601  NA 140 138  K 4.5 4.1  CL 99* 99*  CO2 32 30  GLUCOSE 120* 150*  BUN 22* 22*  CREATININE 0.62 0.69  CALCIUM 9.1 9.2   Liver Function Tests: No results for input(s): AST, ALT, ALKPHOS, BILITOT, PROT, ALBUMIN in the last 168 hours. No results for input(s): LIPASE, AMYLASE in the  last 168 hours. No results for input(s): AMMONIA in the last 168 hours. CBC:  Recent Labs Lab 09/10/15 0545 09/11/15 0601  WBC 9.4 7.5  NEUTROABS 7.3  --   HGB 9.1* 9.9*  HCT 31.5* 32.7*  MCV 83.3 80.5  PLT 245 275   Cardiac Enzymes: No results for input(s): CKTOTAL, CKMB, CKMBINDEX, TROPONINI in the last 168 hours. BNP: BNP (last 3 results)  Recent Labs  11/27/14 1458 03/15/15 1430 06/30/15 1450  BNP 87.7 45.7 133.7*    ProBNP (last 3 results) No results for input(s): PROBNP in the last 8760 hours.  CBG:  Recent Labs Lab 09/15/15 1206 09/15/15 1709 09/15/15 2123 09/16/15 0735 09/16/15 1211  GLUCAP 148* 127* 109* 163* 155*    Signed:  Penny PiaVEGA, Mae Cianci MD.  [facility] 09/16/2015, 1:31 PM

## 2015-09-16 NOTE — Progress Notes (Signed)
Patient verbalized understanding of discharge instructions. Patient was given prescriptions. Patient is stable at discharge. 

## 2015-10-17 ENCOUNTER — Inpatient Hospital Stay (HOSPITAL_COMMUNITY)
Admission: EM | Admit: 2015-10-17 | Discharge: 2015-10-24 | DRG: 189 | Disposition: A | Discharge status: Home / Self Care | Payer: Medicare Other | Attending: Internal Medicine | Admitting: Internal Medicine

## 2015-10-17 ENCOUNTER — Encounter (HOSPITAL_COMMUNITY): Payer: Self-pay | Admitting: Emergency Medicine

## 2015-10-17 ENCOUNTER — Emergency Department (HOSPITAL_COMMUNITY): Payer: Medicare Other

## 2015-10-17 DIAGNOSIS — K224 Dyskinesia of esophagus: Secondary | ICD-10-CM | POA: Diagnosis present

## 2015-10-17 DIAGNOSIS — Z79899 Other long term (current) drug therapy: Secondary | ICD-10-CM

## 2015-10-17 DIAGNOSIS — R0689 Other abnormalities of breathing: Secondary | ICD-10-CM

## 2015-10-17 DIAGNOSIS — Z9841 Cataract extraction status, right eye: Secondary | ICD-10-CM

## 2015-10-17 DIAGNOSIS — E119 Type 2 diabetes mellitus without complications: Secondary | ICD-10-CM

## 2015-10-17 DIAGNOSIS — Z7984 Long term (current) use of oral hypoglycemic drugs: Secondary | ICD-10-CM

## 2015-10-17 DIAGNOSIS — G4733 Obstructive sleep apnea (adult) (pediatric): Secondary | ICD-10-CM | POA: Diagnosis present

## 2015-10-17 DIAGNOSIS — E1151 Type 2 diabetes mellitus with diabetic peripheral angiopathy without gangrene: Secondary | ICD-10-CM | POA: Diagnosis present

## 2015-10-17 DIAGNOSIS — Z981 Arthrodesis status: Secondary | ICD-10-CM | POA: Diagnosis not present

## 2015-10-17 DIAGNOSIS — I5032 Chronic diastolic (congestive) heart failure: Secondary | ICD-10-CM | POA: Diagnosis present

## 2015-10-17 DIAGNOSIS — I110 Hypertensive heart disease with heart failure: Secondary | ICD-10-CM | POA: Diagnosis present

## 2015-10-17 DIAGNOSIS — Z961 Presence of intraocular lens: Secondary | ICD-10-CM | POA: Diagnosis present

## 2015-10-17 DIAGNOSIS — K219 Gastro-esophageal reflux disease without esophagitis: Secondary | ICD-10-CM | POA: Diagnosis present

## 2015-10-17 DIAGNOSIS — N183 Chronic kidney disease, stage 3 (moderate): Secondary | ICD-10-CM | POA: Diagnosis not present

## 2015-10-17 DIAGNOSIS — J441 Chronic obstructive pulmonary disease with (acute) exacerbation: Secondary | ICD-10-CM | POA: Diagnosis present

## 2015-10-17 DIAGNOSIS — I10 Essential (primary) hypertension: Secondary | ICD-10-CM | POA: Diagnosis present

## 2015-10-17 DIAGNOSIS — F17200 Nicotine dependence, unspecified, uncomplicated: Secondary | ICD-10-CM | POA: Diagnosis present

## 2015-10-17 DIAGNOSIS — D638 Anemia in other chronic diseases classified elsewhere: Secondary | ICD-10-CM | POA: Diagnosis present

## 2015-10-17 DIAGNOSIS — Z9842 Cataract extraction status, left eye: Secondary | ICD-10-CM

## 2015-10-17 DIAGNOSIS — J9602 Acute respiratory failure with hypercapnia: Secondary | ICD-10-CM | POA: Diagnosis not present

## 2015-10-17 DIAGNOSIS — Z9119 Patient's noncompliance with other medical treatment and regimen: Secondary | ICD-10-CM

## 2015-10-17 DIAGNOSIS — F419 Anxiety disorder, unspecified: Secondary | ICD-10-CM | POA: Diagnosis present

## 2015-10-17 DIAGNOSIS — J9621 Acute and chronic respiratory failure with hypoxia: Secondary | ICD-10-CM | POA: Diagnosis present

## 2015-10-17 DIAGNOSIS — M8580 Other specified disorders of bone density and structure, unspecified site: Secondary | ICD-10-CM | POA: Diagnosis present

## 2015-10-17 DIAGNOSIS — Z7951 Long term (current) use of inhaled steroids: Secondary | ICD-10-CM

## 2015-10-17 DIAGNOSIS — J9622 Acute and chronic respiratory failure with hypercapnia: Secondary | ICD-10-CM | POA: Diagnosis present

## 2015-10-17 DIAGNOSIS — R0602 Shortness of breath: Secondary | ICD-10-CM | POA: Diagnosis present

## 2015-10-17 DIAGNOSIS — G8929 Other chronic pain: Secondary | ICD-10-CM | POA: Diagnosis present

## 2015-10-17 DIAGNOSIS — Z9981 Dependence on supplemental oxygen: Secondary | ICD-10-CM

## 2015-10-17 DIAGNOSIS — E1122 Type 2 diabetes mellitus with diabetic chronic kidney disease: Secondary | ICD-10-CM

## 2015-10-17 DIAGNOSIS — Z7982 Long term (current) use of aspirin: Secondary | ICD-10-CM

## 2015-10-17 DIAGNOSIS — Z720 Tobacco use: Secondary | ICD-10-CM | POA: Diagnosis present

## 2015-10-17 DIAGNOSIS — E785 Hyperlipidemia, unspecified: Secondary | ICD-10-CM | POA: Diagnosis present

## 2015-10-17 LAB — COMPREHENSIVE METABOLIC PANEL
ALK PHOS: 55 U/L (ref 38–126)
ALT: 16 U/L (ref 14–54)
ANION GAP: 4 — AB (ref 5–15)
AST: 20 U/L (ref 15–41)
Albumin: 3.5 g/dL (ref 3.5–5.0)
BILIRUBIN TOTAL: 0.2 mg/dL — AB (ref 0.3–1.2)
BUN: 12 mg/dL (ref 6–20)
CALCIUM: 9 mg/dL (ref 8.9–10.3)
CO2: 39 mmol/L — ABNORMAL HIGH (ref 22–32)
Chloride: 100 mmol/L — ABNORMAL LOW (ref 101–111)
Creatinine, Ser: 0.59 mg/dL (ref 0.44–1.00)
GFR calc non Af Amer: >60 mL/min (ref >60)
Glucose, Bld: 178 mg/dL — ABNORMAL HIGH (ref 65–99)
POTASSIUM: 4.4 mmol/L (ref 3.5–5.1)
SODIUM: 143 mmol/L (ref 135–145)
TOTAL PROTEIN: 6.4 g/dL — AB (ref 6.5–8.1)

## 2015-10-17 LAB — CBC WITH DIFFERENTIAL/PLATELET
BASOS ABS: 0 10*3/uL (ref 0.0–0.1)
BASOS PCT: 0 %
EOS PCT: 1 %
Eosinophils Absolute: 0.1 10*3/uL (ref 0.0–0.7)
HCT: 36.4 % (ref 36.0–46.0)
Hemoglobin: 9.7 g/dL — ABNORMAL LOW (ref 12.0–15.0)
LYMPHS PCT: 20 %
Lymphs Abs: 3.4 10*3/uL (ref 0.7–4.0)
MCH: 24.3 pg — ABNORMAL LOW (ref 26.0–34.0)
MCHC: 26.6 g/dL — AB (ref 30.0–36.0)
MCV: 91 fL (ref 78.0–100.0)
MONO ABS: 1.7 10*3/uL — AB (ref 0.1–1.0)
MONOS PCT: 10 %
Neutro Abs: 11.6 10*3/uL — ABNORMAL HIGH (ref 1.7–7.7)
Neutrophils Relative %: 69 %
PLATELETS: 402 10*3/uL — AB (ref 150–400)
RBC: 4 MIL/uL (ref 3.87–5.11)
RDW: 16.6 % — AB (ref 11.5–15.5)
WBC: 16.9 10*3/uL — ABNORMAL HIGH (ref 4.0–10.5)

## 2015-10-17 LAB — I-STAT VENOUS BLOOD GAS, ED
Acid-Base Excess: 14 mmol/L — ABNORMAL HIGH (ref 0.0–2.0)
Bicarbonate: 39.9 mmol/L — ABNORMAL HIGH (ref 20.0–28.0)
O2 SAT: 35 %
PCO2 VEN: 59.4 mmHg (ref 44.0–60.0)
PO2 VEN: 21 mmHg — AB (ref 32.0–45.0)
Patient temperature: 98.6
TCO2: 42 mmol/L (ref 0–100)
pH, Ven: 7.435 — ABNORMAL HIGH (ref 7.250–7.430)

## 2015-10-17 LAB — I-STAT TROPONIN, ED: TROPONIN I, POC: 0.01 ng/mL (ref 0.00–0.08)

## 2015-10-17 LAB — GLUCOSE, CAPILLARY: Glucose-Capillary: 279 mg/dL — ABNORMAL HIGH (ref 65–99)

## 2015-10-17 LAB — CBG MONITORING, ED: Glucose-Capillary: 133 mg/dL — ABNORMAL HIGH (ref 65–99)

## 2015-10-17 LAB — BRAIN NATRIURETIC PEPTIDE: B Natriuretic Peptide: 115.5 pg/mL — ABNORMAL HIGH (ref 0.0–100.0)

## 2015-10-17 MED ORDER — METHYLPREDNISOLONE SODIUM SUCC 125 MG IJ SOLR
60.0000 mg | Freq: Three times a day (TID) | INTRAMUSCULAR | Status: DC
  Administered 2015-10-17 – 2015-10-18 (×4): 60 mg via INTRAVENOUS
  Filled 2015-10-17 (×4): qty 2

## 2015-10-17 MED ORDER — ONDANSETRON HCL 4 MG PO TABS: 4.0000 mg | ORAL_TABLET | Freq: Four times a day (QID) | ORAL | Status: DC | PRN

## 2015-10-17 MED ORDER — POLYETHYLENE GLYCOL 3350 17 G PO PACK: 17.0000 g | PACK | Freq: Every day | ORAL | Status: DC | PRN

## 2015-10-17 MED ORDER — ACETAMINOPHEN 650 MG RE SUPP: 650.0000 mg | Freq: Four times a day (QID) | RECTAL | Status: DC | PRN

## 2015-10-17 MED ORDER — ENOXAPARIN SODIUM 40 MG/0.4ML ~~LOC~~ SOLN
40.0000 mg | SUBCUTANEOUS | Status: DC
  Administered 2015-10-17 – 2015-10-23 (×7): 40 mg via SUBCUTANEOUS
  Filled 2015-10-17 (×7): qty 0.4

## 2015-10-17 MED ORDER — POTASSIUM CHLORIDE CRYS ER 20 MEQ PO TBCR
20.0000 meq | EXTENDED_RELEASE_TABLET | Freq: Every day | ORAL | Status: DC
  Administered 2015-10-18 – 2015-10-21 (×4): 20 meq via ORAL
  Filled 2015-10-17 (×4): qty 1

## 2015-10-17 MED ORDER — TRAZODONE HCL 50 MG PO TABS
25.0000 mg | ORAL_TABLET | Freq: Every evening | ORAL | Status: DC | PRN
  Administered 2015-10-17 – 2015-10-19 (×2): 25 mg via ORAL
  Filled 2015-10-17 (×2): qty 1

## 2015-10-17 MED ORDER — FUROSEMIDE 20 MG PO TABS
20.0000 mg | ORAL_TABLET | Freq: Every day | ORAL | Status: DC
  Administered 2015-10-18 – 2015-10-21 (×4): 20 mg via ORAL
  Filled 2015-10-17 (×4): qty 1

## 2015-10-17 MED ORDER — PANTOPRAZOLE SODIUM 40 MG PO TBEC
40.0000 mg | DELAYED_RELEASE_TABLET | Freq: Every morning | ORAL | Status: DC
  Administered 2015-10-18 – 2015-10-24 (×7): 40 mg via ORAL
  Filled 2015-10-17 (×7): qty 1

## 2015-10-17 MED ORDER — AZITHROMYCIN 500 MG IV SOLR
500.0000 mg | INTRAVENOUS | Status: DC
  Administered 2015-10-18 – 2015-10-20 (×3): 500 mg via INTRAVENOUS
  Filled 2015-10-17 (×4): qty 500

## 2015-10-17 MED ORDER — BISACODYL 5 MG PO TBEC
5.0000 mg | DELAYED_RELEASE_TABLET | Freq: Every day | ORAL | Status: DC | PRN
  Filled 2015-10-17: qty 1

## 2015-10-17 MED ORDER — ENSURE ENLIVE PO LIQD
237.0000 mL | Freq: Two times a day (BID) | ORAL | Status: DC
  Administered 2015-10-18 – 2015-10-22 (×6): 237 mL via ORAL

## 2015-10-17 MED ORDER — IPRATROPIUM-ALBUTEROL 0.5-2.5 (3) MG/3ML IN SOLN
3.0000 mL | RESPIRATORY_TRACT | Status: DC
  Administered 2015-10-17 – 2015-10-22 (×30): 3 mL via RESPIRATORY_TRACT
  Filled 2015-10-17 (×30): qty 3

## 2015-10-17 MED ORDER — LATANOPROST 0.005 % OP SOLN
1.0000 [drp] | Freq: Every day | OPHTHALMIC | Status: DC
  Administered 2015-10-17 – 2015-10-23 (×7): 1 [drp] via OPHTHALMIC
  Filled 2015-10-17 (×2): qty 2.5

## 2015-10-17 MED ORDER — ALBUTEROL (5 MG/ML) CONTINUOUS INHALATION SOLN
10.0000 mg/h | INHALATION_SOLUTION | Freq: Once | RESPIRATORY_TRACT | Status: AC
  Administered 2015-10-17: 10 mg/h via RESPIRATORY_TRACT
  Filled 2015-10-17: qty 20

## 2015-10-17 MED ORDER — ACETAMINOPHEN 325 MG PO TABS
650.0000 mg | ORAL_TABLET | Freq: Four times a day (QID) | ORAL | Status: DC | PRN
  Filled 2015-10-17: qty 2

## 2015-10-17 MED ORDER — ATORVASTATIN CALCIUM 20 MG PO TABS
20.0000 mg | ORAL_TABLET | Freq: Every day | ORAL | Status: DC
  Administered 2015-10-18 – 2015-10-24 (×7): 20 mg via ORAL
  Filled 2015-10-17 (×7): qty 1

## 2015-10-17 MED ORDER — FLUTICASONE PROPIONATE 50 MCG/ACT NA SUSP
1.0000 | Freq: Two times a day (BID) | NASAL | Status: DC
  Administered 2015-10-17 – 2015-10-24 (×13): 1 via NASAL
  Filled 2015-10-17 (×2): qty 16

## 2015-10-17 MED ORDER — ASPIRIN EC 81 MG PO TBEC
81.0000 mg | DELAYED_RELEASE_TABLET | Freq: Every day | ORAL | Status: DC
  Administered 2015-10-18 – 2015-10-24 (×7): 81 mg via ORAL
  Filled 2015-10-17 (×7): qty 1

## 2015-10-17 MED ORDER — ASPIRIN 81 MG PO TBEC: 81.0000 mg | DELAYED_RELEASE_TABLET | Freq: Every day | ORAL | Status: DC

## 2015-10-17 MED ORDER — CEFTRIAXONE SODIUM 1 G IJ SOLR
1.0000 g | INTRAMUSCULAR | Status: DC
  Administered 2015-10-17 – 2015-10-20 (×4): 1 g via INTRAVENOUS
  Filled 2015-10-17 (×7): qty 10

## 2015-10-17 MED ORDER — DULOXETINE HCL 60 MG PO CPEP
60.0000 mg | ORAL_CAPSULE | Freq: Every day | ORAL | Status: DC
  Administered 2015-10-18 – 2015-10-24 (×7): 60 mg via ORAL
  Filled 2015-10-17 (×7): qty 1

## 2015-10-17 MED ORDER — IPRATROPIUM-ALBUTEROL 0.5-2.5 (3) MG/3ML IN SOLN
RESPIRATORY_TRACT | Status: AC
  Filled 2015-10-17: qty 3

## 2015-10-17 MED ORDER — LOSARTAN POTASSIUM 50 MG PO TABS
25.0000 mg | ORAL_TABLET | Freq: Every day | ORAL | Status: DC
  Administered 2015-10-18 – 2015-10-24 (×7): 25 mg via ORAL
  Filled 2015-10-17 (×7): qty 1

## 2015-10-17 MED ORDER — ONDANSETRON HCL 4 MG/2ML IJ SOLN: 4.0000 mg | Freq: Four times a day (QID) | INTRAMUSCULAR | Status: DC | PRN

## 2015-10-17 MED ORDER — DEXTROSE 5 % IV SOLN
500.0000 mg | Freq: Once | INTRAVENOUS | Status: AC
  Administered 2015-10-17: 500 mg via INTRAVENOUS
  Filled 2015-10-17: qty 500

## 2015-10-17 MED ORDER — INSULIN ASPART 100 UNIT/ML ~~LOC~~ SOLN
0.0000 [IU] | Freq: Three times a day (TID) | SUBCUTANEOUS | Status: DC
  Administered 2015-10-17 – 2015-10-18 (×2): 1 [IU] via SUBCUTANEOUS
  Administered 2015-10-18: 2 [IU] via SUBCUTANEOUS
  Administered 2015-10-19 (×2): 1 [IU] via SUBCUTANEOUS
  Administered 2015-10-20: 3 [IU] via SUBCUTANEOUS
  Administered 2015-10-20 – 2015-10-21 (×2): 2 [IU] via SUBCUTANEOUS
  Administered 2015-10-21: 1 [IU] via SUBCUTANEOUS
  Administered 2015-10-22: 2 [IU] via SUBCUTANEOUS
  Administered 2015-10-22 (×2): 1 [IU] via SUBCUTANEOUS
  Administered 2015-10-23: 2 [IU] via SUBCUTANEOUS
  Administered 2015-10-24: 1 [IU] via SUBCUTANEOUS
  Filled 2015-10-17: qty 1

## 2015-10-17 MED ORDER — ALBUTEROL SULFATE (2.5 MG/3ML) 0.083% IN NEBU
2.5000 mg | INHALATION_SOLUTION | RESPIRATORY_TRACT | Status: DC | PRN
Start: 2015-10-17 — End: 2015-10-24

## 2015-10-17 MED ORDER — MORPHINE SULFATE (PF) 2 MG/ML IV SOLN: 2.0000 mg | INTRAVENOUS | Status: DC | PRN

## 2015-10-17 NOTE — ED Provider Notes (Addendum)
[facility]-EMERGENCY DEPT Provider Note   CSN: [id number] Arrival date & time: 10/17/15  1154     History   Chief Complaint Chief Complaint  Patient presents with  . Respiratory Distress    HPI Nicole Maxwell is a 70 y.o. female.  HPI Level 5 caveat due to acuity of condition.  70 year old female with history of COPD on 2 L home oxygen, chronic diastolic heart failure, hypertension, hyperlipidemia, and tobacco use. The patient just discharged one month ago August 22 for COPD exacerbation. History is provided by EMS, who states that they were called to the patient's home for shortness of breath. Patient with increased work of breathing, oxygenation of 96% on 2 L home oxygen on EMS arrival. With decreased level of consciousness, but obeying commands. Given 5 mg albuterol, 0.5 of Atrovent, and one 25 mg of Solu-Medrol. Brought to ED for further evaluation. Past Medical History:  Diagnosis Date  . Alcohol abuse, in remission quit 2004  . Anemia of chronic disease   . Anxiety    Xanax prn  . Arthritis    "my whole body"  . Asthma   . Chronic back pain    "goes from the lower part of my back on down my legs"  (04/17/2014)  . Chronic respiratory failure with hypercapnia (HCC)   . COPD (chronic obstructive pulmonary disease) (HCC)    cxr 04/03/2010 - hyperinflation  . COPD exacerbation (HCC)   . Dysphagia   . Esophageal dysmotility   . HCAP (healthcare-associated pneumonia)   . Hyperlipidemia   . Hypertension   . Leukocytosis   . Neuromuscular disorder (HCC)    lumbar disc  . On home oxygen therapy    "2L; suppose to be 24/7" (04/17/2014)  . Osteopenia   . Physical deconditioning   . Pneumonia    "3 times in the last month or so" (04/17/2014)  . Protein calorie malnutrition (HCC)   . PVD (peripheral vascular disease) (HCC)   . Shortness of breath    with anxiety  and activty  . Tobacco abuse    " I am addicted"   . Type II diabetes mellitus (HCC)   . Vitamin D deficiency disease      03/26/2010 - 9.9ng/mL. Started on replacemnt    Patient Active Problem List   Diagnosis Date Noted  . Anemia 06/30/2015  . CAP (community acquired pneumonia) 06/30/2015  . Acute encephalopathy 06/30/2015  . Tobacco use 06/30/2015  . Abnormal echocardiogram 04/07/2015  . Protein-calorie malnutrition, severe 03/23/2015  . Acute respiratory failure with hypoxia and hypercarbia (HCC) 03/22/2015  . Slow transit constipation 01/07/2015  . Normocytic anemia 11/27/2014  . Essential hypertension 11/27/2014  . Leukocytosis 11/26/2014  . Physical deconditioning 07/01/2014  . Smoking 07/01/2014  . Pulmonary infiltrate in left lung on chest x-ray 04/16/2014  . DM type 2 (diabetes mellitus, type 2) (HCC) 04/16/2014  . Chronic respiratory failure (HCC) 04/16/2014  . Vitamin D deficiency disease 04/16/2014  . Pulmonary infiltrate 04/16/2014  . Esophageal dysmotility 02/27/2014  . HCAP (healthcare-associated pneumonia) 02/21/2014  . Healthcare-associated pneumonia 02/21/2014  . Abdominal pain, epigastric 01/09/2014  . Chronic diastolic congestive heart failure (HCC) 01/07/2014  . Malnutrition of moderate degree (HCC) 01/03/2014  . Acute on chronic respiratory failure (HCC) 01/03/2014  . COPD with acute exacerbation (HCC) 01/01/2014  . Hyponatremia 04/09/2012    Class: Acute  . Diabetes mellitus type 2, noninsulin dependent (HCC) 04/06/2012  . COPD exacerbation (HCC) 12/09/2011  . Preop pulmonary/respiratory exam 02/28/2011  .  HTN (hypertension) 05/12/2010  . Hyperlipidemia 05/12/2010  . Severe chronic obstructive pulmonary disease (HCC) 04/29/2010  . Chest pain 04/29/2010    Past Surgical History:  Procedure Laterality Date  . ABDOMINAL HYSTERECTOMY     "partial"  . APPENDECTOMY  1960  . BACK SURGERY    . CARPAL TUNNEL RELEASE Bilateral    CTS repair 2000 (R), 2006 (L)/notes 01/01/2014  . CATARACT EXTRACTION W/ INTRAOCULAR LENS  IMPLANT, BILATERAL Bilateral 2015  . LUMBAR  LAMINECTOMY/DECOMPRESSION MICRODISCECTOMY  2005   L4-5/notes 03/06/2010  . POSTERIOR LUMBAR FUSION  2002  . TONSILLECTOMY  1960    OB History    No data available       Home Medications    Prior to Admission medications   Medication Sig Start Date End Date Taking? Authorizing Provider  acetaminophen (TYLENOL) 650 MG CR tablet Take 1,300 mg by mouth every 8 (eight) hours as needed for pain.   Yes Historical Provider, MD  albuterol (PROAIR HFA) 108 (90 Base) MCG/ACT inhaler Inhale 2 puffs into the lungs every 6 (six) hours.   Yes Historical Provider, MD  alprazolam Prudy Feeler(XANAX) 2 MG tablet Take 1-2 mg by mouth See admin instructions. Take 1/2 tablet (1 mg) by mouth during the day and 1 tablet (2 mg) at bedtime 06/21/15  Yes Historical Provider, MD  aspirin EC 81 MG EC tablet Take 1 tablet (81 mg total) by mouth daily. 01/09/14  Yes Martha ClanWilliam Shaw, MD  atorvastatin (LIPITOR) 20 MG tablet Take 20 mg by mouth daily.  09/14/14  Yes Historical Provider, MD  DULoxetine (CYMBALTA) 60 MG capsule Take 60 mg by mouth daily. Reported on 08/01/2015   Yes Historical Provider, MD  feeding supplement, GLUCERNA SHAKE, (GLUCERNA SHAKE) LIQD Take 237 mLs by mouth daily.    Yes Historical Provider, MD  fluticasone (FLONASE) 50 MCG/ACT nasal spray Place 1 spray into both nostrils 2 (two) times daily. Patient taking differently: Place 2 sprays into both nostrils daily.  12/23/14  Yes Sharee Holstereborah S Green, NP  furosemide (LASIX) 20 MG tablet Take 20 mg by mouth daily.    Yes Historical Provider, MD  HYDROcodone-acetaminophen (NORCO/VICODIN) 5-325 MG tablet Take 1 tablet by mouth every 6 (six) hours as needed for moderate pain.   Yes Historical Provider, MD  ipratropium-albuterol (DUONEB) 0.5-2.5 (3) MG/3ML SOLN Inhale 3 mLs into the lungs every 6 (six) hours. 10/02/15  Yes Historical Provider, MD  latanoprost (XALATAN) 0.005 % ophthalmic solution Place 1 drop into both eyes at bedtime. 10/15/15  Yes Historical Provider, MD    losartan (COZAAR) 25 MG tablet Take 25 mg by mouth daily. 10/01/15  Yes Historical Provider, MD  metFORMIN (GLUCOPHAGE) 500 MG tablet TAKE 1 TABLET BY MOUTH TWICE DAILY 01/07/15  Yes Sharee Holstereborah S Green, NP  Multiple Vitamin (MULTIVITAMIN WITH MINERALS) TABS tablet Take 1 tablet by mouth daily.   Yes Historical Provider, MD  OXYGEN Inhale 2 L into the lungs continuous.   Yes Historical Provider, MD  pantoprazole (PROTONIX) 40 MG tablet TAKE 1 TABLET BY MOUTH EVERY NIGHT Patient taking differently: TAKE 1 TABLET BY MOUTH EVERY MORNING 01/07/15  Yes Sharee Holstereborah S Green, NP  potassium chloride SA (K-DUR,KLOR-CON) 20 MEQ tablet Take 1 tablet (20 mEq total) by mouth daily. Take while on lasix 12/12/14  Yes Jeanella CrazeBrandi L Ollis, NP  predniSONE (DELTASONE) 5 MG tablet Take 2 tablets (10 mg total) by mouth as directed. Take 40 mg daily for the next 3 days, then take 30 mg daily for the  next 3 days, then take 20 mg daily for the next 3 days, then take 10 mg daily Patient taking differently: Take 5 mg by mouth See admin instructions. Take 1 tablet (5 mg) by mouth daily (take 4 tablets (20 mg) by mouth 9/18 thru 9/21 per MD due to shortness of breath) 09/16/15  Yes Penny Pia, MD  PRESCRIPTION MEDICATION Inhale into the lungs continuous. CPAP   Yes Historical Provider, MD  Tetrahydroz-Polyvinyl Al-Povid (CLEAR EYES TRIPLE ACTION) 0.05-0.5-0.6 % SOLN Place 1 drop into both eyes 3 (three) times daily as needed (dry eyes).   Yes Historical Provider, MD  albuterol (PROVENTIL) (2.5 MG/3ML) 0.083% nebulizer solution Take 3 mLs (2.5 mg total) by nebulization every 4 (four) hours as needed for wheezing or shortness of breath. (may take 1 every 4 hours as needed for wheezing/shortness of breath) DX: 496 Patient not taking: Reported on 10/17/2015 01/09/14   [name], MD    Family History Family History  Problem Relation Age of Onset  . Heart attack Sister 68    CABG  . Breast cancer Sister   . Cancer Brother   . Heart attack  Brother   . Hypertension Other     all siblings  . Anesthesia problems Neg Hx     Social History Social History  Substance Use Topics  . Smoking status: Former Smoker    Packs/day: 0.25    Years: 56.00    Types: Cigarettes    Quit date: 01/27/2015  . Smokeless tobacco: Never Used  . Alcohol use 0.0 oz/week     Comment: h/o heavy ETOH quit 2004     Allergies   Brovana [arformoterol tartrate]; Budesonide; and Mucomyst [acetylcysteine]   Review of Systems Review of Systems  Unable to perform ROS: Mental status change     Physical Exam Updated Vital Signs BP 138/62   Pulse 94   Resp (!) 28   SpO2 92%   Physical Exam Physical Exam  Nursing note and vitals reviewed. Constitutional: Somnolent with respiratory distress Head: Normocephalic and atraumatic.  Mouth/Throat: Oropharynx is clear and dry.  Neck: Normal range of motion. Neck supple.  Cardiovascular: Tachycardic rate and regular rhythm. No edema   Pulmonary/Chest: Effort increased, accessory muscle usage, and breath sounds tight with expiratory wheezing throughout Abdominal: Soft. There is no tenderness. There is no rebound and no guarding.  Musculoskeletal: Normal range of motion.  Neurological: Somnolent arouses to voice, obeys commands, no facial droop, moves all extremities symmetrically Skin: Skin is warm and dry.  Psychiatric: Cooperative   ED Treatments / Results  Labs (all labs ordered are listed, but only abnormal results are displayed) Labs Reviewed  BRAIN NATRIURETIC PEPTIDE - Abnormal; Notable for the following:       Result Value   B Natriuretic Peptide 115.5 (*)    All other components within normal limits  CBC WITH DIFFERENTIAL/PLATELET - Abnormal; Notable for the following:    WBC 16.9 (*)    Hemoglobin 9.7 (*)    MCH 24.3 (*)    MCHC 26.6 (*)    RDW 16.6 (*)    Platelets 402 (*)    Neutro Abs 11.6 (*)    Monocytes Absolute 1.7 (*)    All other components within normal limits    COMPREHENSIVE METABOLIC PANEL - Abnormal; Notable for the following:    Chloride 100 (*)    CO2 39 (*)    Glucose, Bld 178 (*)    Total Protein 6.4 (*)  Total Bilirubin 0.2 (*)    Anion gap 4 (*)    All other components within normal limits  I-STAT TROPOININ, ED    EKG  EKG Interpretation  Date/Time:  Friday October 17 2015 12:03:37 EDT Ventricular Rate:  98 PR Interval:    QRS Duration: 81 QT Interval:  352 QTC Calculation: 450 R Axis:   36 Text Interpretation:  Sinus rhythm Ventricular premature complex Aberrant conduction of SV complex(es) Borderline low voltage, extremity leads Discard TECHNICALLY DIFFICULT Confirmed by Mersadies Petree MD, Lavaun Greenfield [phone]) on 10/17/2015 1:18:44 PM       Radiology Dg Chest Portable 1 View  Result Date: 10/17/2015 CLINICAL DATA:  Weakness and dyspnea EXAM: PORTABLE CHEST 1 VIEW COMPARISON:  09/10/2015 FINDINGS: Cardiac shadow is stable. The lungs are well aerated bilaterally. No focal infiltrate or sizable effusion is seen. No bony abnormality is noted. IMPRESSION: No acute abnormality seen. Electronically Signed   By: Alcide Clever M.D.   On: 10/17/2015 12:30    Procedures Procedures (including critical care time) CRITICAL CARE Performed by: Lavera Guise   Total critical care time: 45 minutes  Critical care time was exclusive of separately billable procedures and treating other patients.  Critical care was necessary to treat or prevent imminent or life-threatening deterioration.  Critical care was time spent personally by me on the following activities: development of treatment plan with patient and/or surrogate as well as nursing, discussions with consultants, evaluation of patient's response to treatment, examination of patient, obtaining history from patient or surrogate, ordering and performing treatments and interventions, ordering and review of laboratory studies, ordering and review of radiographic studies, pulse oximetry and re-evaluation  of patient's condition.  Medications Ordered in ED Medications  albuterol (PROVENTIL,VENTOLIN) solution continuous neb (10 mg/hr Nebulization Given 10/17/15 1251)  azithromycin (ZITHROMAX) 500 mg in dextrose 5 % 250 mL IVPB (0 mg Intravenous Stopped 10/17/15 1403)     Initial Impression / Assessment and Plan / ED Course  I have reviewed the triage vital signs and the nursing notes.  Pertinent labs & imaging results that were available during my care of the patient were reviewed by me and considered in my medical decision making (see chart for details).  Clinical Course   History of COPD on chronic 2 L home oxygen who presents with respiratory distress. Presentation seems consistent with hypercapnic respiratory failure secondary to COPD exacerbation. Somnolent, arouses to voice and obeys simple commands. Poor air movement with diffuse inspiratory and expiratory wheezing in all lung fields. Appears euvolemic. Less likely CHF exacerbation, especially clear chest x-ray and minimally elevated BNP. No evidence of pneumonia or other acute cardio pulmonary processes. Received steroids and breathing treatments by EMS. Placed on BiPAP in ED and on continuous albuterol. Given azithromycin. On reevaluation, she does have improving mental status. Response more readily to voice and conversation. Her ABG does show respiratory acidosis with PCO2 greater than 100 and pH 7.2. Given clinical improvement will admit to stepdown.   Final Clinical Impressions(s) / ED Diagnoses   Final diagnoses:  None    New Prescriptions New Prescriptions   No medications on file     Lavera Guise, MD 10/17/15 1549    Lavera Guise, MD 10/18/15 [phone]

## 2015-10-17 NOTE — Progress Notes (Addendum)
Per MD at bedside  - RT to decrease FIO2 24%. No further orders. Per MD abg @ 2000

## 2015-10-17 NOTE — ED Triage Notes (Signed)
[facility] with respiratory distress. Hx of COPD, pt wears 2L [place] baseline, CPAP at night, pt also takes Lasix. Per pt's daughter, pt has been without CPAP x2951mo due to machine being broken. Per EMS, pt was 96% on 2L [place], was given Albuterol, Atrovent, 125mg  Solumedrol, 2g Mg IV en route. Pt's sats 100% on 6L simple mask. Pt lethargic, able to open eyes, grunts to answer questions.

## 2015-10-17 NOTE — H&P (Signed)
History and Physical    Nicole Maxwell ZOX:[id number] DOB: 1945-06-26 DOA: 10/17/2015  PCP: [name], MD  Patient coming from:   Home   Chief Complaint:  lethargy  HPI: Nicole Maxwell is a 70 y.o. female with a medical history significant for, but not  limited to, COPD on home O2, hypertension, peripheral vascular disease, diastolic heart failure and diabetes. Patient was discharged for several days last month for treatment of COPD exacerbation. She completed 7 days of doxycycline . Patient uses CPAP machine at night but this apparently been broken for a month. Patient continues to smoke per daughter. Patient's daughter went to check on her this morning and found the patient somnolent. EMS was called and patient found to have normal oxygen saturation on 2 L though she was lethargic, wheezy.. EMS gave her nebulizers, solumedrol 125 mg and placed on 6 liters 02. Patient complaining of bilateral leg pain for a couple of weeks. She may have taken hydrocodone last evening.  ED Course:  Heart rate in the nineties, tachypneic with respirations up to 28 / minute. Abnormal blood gases with pH of 7.2, PCO2 greater than 100, PO2 25. Patient placed on BiPAP with some improvement in her mental status CO2 39 BNP 115, POC troponin 0.01 Wbc 16.9 9 (recent steroids), hemoglobin 9.7 (at baseline), MCV 91. No acute abnormalities on chest x-ray  Review of Systems: Unobtainable, patient with some confusion and somnolence  Past Medical History:  Diagnosis Date  . Alcohol abuse, in remission quit 2004  . Anemia of chronic disease   . Anxiety    Xanax prn  . Arthritis    "my whole body"  . Asthma   . Chronic back pain    "goes from the lower part of my back on down my legs"  (04/17/2014)  . Chronic respiratory failure with hypercapnia (HCC)   . COPD (chronic obstructive pulmonary disease) (HCC)    cxr 04/03/2010 - hyperinflation  . Dysphagia   . Esophageal dysmotility   . HCAP (healthcare-associated  pneumonia)   . Hyperlipidemia   . Hypertension   . Leukocytosis   . Neuromuscular disorder (HCC)    lumbar disc  . On home oxygen therapy    "2L; suppose to be 24/7" (04/17/2014)  . Osteopenia   . Physical deconditioning   . Pneumonia    "3 times in the last month or so" (04/17/2014)  . Protein calorie malnutrition (HCC)   . PVD (peripheral vascular disease) (HCC)    with anxiety  and activty  . Tobacco abuse    " I am addicted"   . Type II diabetes mellitus (HCC)   . Vitamin D deficiency disease    03/26/2010 - 9.9ng/mL. Started on replacemnt    Past Surgical History:  Procedure Laterality Date  . ABDOMINAL HYSTERECTOMY     "partial"  . APPENDECTOMY  1960  . BACK SURGERY    . CARPAL TUNNEL RELEASE Bilateral    CTS repair 2000 (R), 2006 (L)/notes 01/01/2014  . CATARACT EXTRACTION W/ INTRAOCULAR LENS  IMPLANT, BILATERAL Bilateral 2015  . LUMBAR LAMINECTOMY/DECOMPRESSION MICRODISCECTOMY  2005   L4-5/notes 03/06/2010  . POSTERIOR LUMBAR FUSION  2002  . TONSILLECTOMY  1960    Social History   Social History  . Marital status: Widowed    Spouse name: N/A  . Number of children: 1  . Years of education: N/A   Occupational History  . retired from [employer] Retired   Social History Main Topics  .  Smoking status: Former Smoker    Packs/day: 0.25    Years: 56.00    Types: Cigarettes    Quit date: 01/27/2015  . Smokeless tobacco: Never Used  . Alcohol use 0.0 oz/week     Comment: h/o heavy ETOH quit 2004  . Drug use: No  . Sexual activity: No   Other Topics Concern  . Not on file   Social History Narrative  . No narrative on file   Lives at home alone. Daughter and other family members check in on her regularly.  Uses cane as needed for ambulation.   Allergies  Allergen Reactions  . Brovana [Arformoterol Tartrate] Shortness Of Breath and Cough    General intolerance  . Budesonide Shortness Of Breath and Cough    General intolerance  . Mucomyst  [Acetylcysteine] Shortness Of Breath    Family History  Problem Relation Age of Onset  . Heart attack Sister [id number]    CABG  . Breast cancer Sister   . Cancer Brother   . Heart attack Brother   . Hypertension Other     all siblings  . Anesthesia problems Neg Hx     Prior to Admission medications   Medication Sig Start Date End Date Taking? Authorizing Provider  acetaminophen (TYLENOL) 650 MG CR tablet Take 1,300 mg by mouth every 8 (eight) hours as needed for pain.   Yes Historical Provider, MD  albuterol (PROAIR HFA) 108 (90 Base) MCG/ACT inhaler Inhale 2 puffs into the lungs every 6 (six) hours.   Yes Historical Provider, MD  alprazolam Prudy Feeler(XANAX) 2 MG tablet Take 1-2 mg by mouth See admin instructions. Take 1/2 tablet (1 mg) by mouth during the day and 1 tablet (2 mg) at bedtime 06/21/15  Yes Historical Provider, MD  aspirin EC 81 MG EC tablet Take 1 tablet (81 mg total) by mouth daily. 01/09/14  Yes Martha ClanWilliam Shaw, MD  atorvastatin (LIPITOR) 20 MG tablet Take 20 mg by mouth daily.  09/14/14  Yes Historical Provider, MD  DULoxetine (CYMBALTA) 60 MG capsule Take 60 mg by mouth daily. Reported on 08/01/2015   Yes Historical Provider, MD  feeding supplement, GLUCERNA SHAKE, (GLUCERNA SHAKE) LIQD Take 237 mLs by mouth daily.    Yes Historical Provider, MD  fluticasone (FLONASE) 50 MCG/ACT nasal spray Place 1 spray into both nostrils 2 (two) times daily. Patient taking differently: Place 2 sprays into both nostrils daily.  12/23/14  Yes Sharee Holstereborah S Green, NP  furosemide (LASIX) 20 MG tablet Take 20 mg by mouth daily.    Yes Historical Provider, MD  HYDROcodone-acetaminophen (NORCO/VICODIN) 5-325 MG tablet Take 1 tablet by mouth every 6 (six) hours as needed for moderate pain.   Yes Historical Provider, MD  ipratropium-albuterol (DUONEB) 0.5-2.5 (3) MG/3ML SOLN Inhale 3 mLs into the lungs every 6 (six) hours. 10/02/15  Yes Historical Provider, MD  latanoprost (XALATAN) 0.005 % ophthalmic solution Place 1  drop into both eyes at bedtime. 10/15/15  Yes Historical Provider, MD  losartan (COZAAR) 25 MG tablet Take 25 mg by mouth daily. 10/01/15  Yes Historical Provider, MD  metFORMIN (GLUCOPHAGE) 500 MG tablet TAKE 1 TABLET BY MOUTH TWICE DAILY 01/07/15  Yes Sharee Holstereborah S Green, NP  Multiple Vitamin (MULTIVITAMIN WITH MINERALS) TABS tablet Take 1 tablet by mouth daily.   Yes Historical Provider, MD  OXYGEN Inhale 2 L into the lungs continuous.   Yes Historical Provider, MD  pantoprazole (PROTONIX) 40 MG tablet TAKE 1 TABLET BY MOUTH EVERY NIGHT Patient taking  differently: TAKE 1 TABLET BY MOUTH EVERY MORNING 01/07/15  Yes Sharee Holster, NP  potassium chloride SA (K-DUR,KLOR-CON) 20 MEQ tablet Take 1 tablet (20 mEq total) by mouth daily. Take while on lasix 12/12/14  Yes Jeanella Craze, NP  predniSONE (DELTASONE) 5 MG tablet Take 2 tablets (10 mg total) by mouth as directed. Take 40 mg daily for the next 3 days, then take 30 mg daily for the next 3 days, then take 20 mg daily for the next 3 days, then take 10 mg daily Patient taking differently: Take 5 mg by mouth See admin instructions. Take 1 tablet (5 mg) by mouth daily (take 4 tablets (20 mg) by mouth 9/18 thru 9/21 per MD due to shortness of breath) 09/16/15  Yes Penny Pia, MD  PRESCRIPTION MEDICATION Inhale into the lungs continuous. CPAP   Yes Historical Provider, MD  Tetrahydroz-Polyvinyl Al-Povid (CLEAR EYES TRIPLE ACTION) 0.05-0.5-0.6 % SOLN Place 1 drop into both eyes 3 (three) times daily as needed (dry eyes).   Yes Historical Provider, MD  albuterol (PROVENTIL) (2.5 MG/3ML) 0.083% nebulizer solution Take 3 mLs (2.5 mg total) by nebulization every 4 (four) hours as needed for wheezing or shortness of breath. (may take 1 every 4 hours as needed for wheezing/shortness of breath) DX: 496 Patient not taking: Reported on 10/17/2015 01/09/14   [name], MD    Physical Exam: Vitals:   10/17/15 1330 10/17/15 1345 10/17/15 1400 10/17/15 1415  BP:  136/58 138/74 134/62 138/62  Pulse: 94 96 98 94  Resp: (!) 27 22 23  (!) 28  SpO2: 91% 92% 94% 92%    Constitutional:  Well developed black female. NAD. Will slowly open eyes when spoken to.  Vitals:   10/17/15 1330 10/17/15 1345 10/17/15 1400 10/17/15 1415  BP: 136/58 138/74 134/62 138/62  Pulse: 94 96 98 94  Resp: (!) 27 22 23  (!) 28  SpO2: 91% 92% 94% 92%   Eyes: PER, lids and conjunctivae normal ENMT: Mucous membranes are moist. Posterior pharynx clear of any exudate or lesions.  Neck: normal, supple, no masses Respiratory: significantly diminished breath sounds at both bases. Bilateral wheezing. On bipap. No accessory muscle use.  Cardiovascular: Regular rate and rhythm, no murmurs / rubs / gallops. No extremity edema. 2+ dorsal pedis pulses.   Abdomen: no tenderness, no masses palpated. No hepatomegaly. Bowel sounds positive.  Musculoskeletal: no clubbing / cyanosis. No joint deformity upper and lower extremities. Good ROM, no contractures. Normal muscle tone, 3/5 strength in bilateral upper and lower extremities.   Skin: no rashes, lesions, ulcers.  Neurologic: excessively sleepy. She slowly opens eyes when prompted. Aware at hospital but doesn't know the year. Follows command.    Psychiatric: cooperative. Unable to assess in more depth given lethargy.  Labs on Admission: I have personally reviewed following labs and imaging studies   Radiological Exams on Admission: Dg Chest Portable 1 View  Result Date: 10/17/2015 CLINICAL DATA:  Weakness and dyspnea EXAM: PORTABLE CHEST 1 VIEW COMPARISON:  09/10/2015 FINDINGS: Cardiac shadow is stable. The lungs are well aerated bilaterally. No focal infiltrate or sizable effusion is seen. No bony abnormality is noted. IMPRESSION: No acute abnormality seen. Electronically Signed   By: Alcide Clever M.D.   On: 10/17/2015 12:30    EKG: Independently reviewed.   EKG Interpretation  Date/Time:  Friday October 17 2015 12:03:37  EDT Ventricular Rate:  98 PR Interval:    QRS Duration: 81 QT Interval:  352 QTC Calculation: 450 R  Axis:   36 Text Interpretation:  Sinus rhythm Ventricular premature complex Aberrant conduction of SV complex(es) Borderline low voltage, extremity leads Discard TECHNICALLY DIFFICULT Confirmed by LIU MD, Annabelle Harman ([id number]) on 10/17/2015 1:18:44 PM       Assessment/Plan   Active Problems:   COPD exacerbation (HCC)   Acute respiratory failure with hypercapnia (HCC)   Chronic diastolic congestive heart failure (HCC)   DM type 2 (diabetes mellitus, type 2) (HCC)   Essential hypertension   Tobacco use      COPD with acute on chronic respiratory failure. ABG --> significant C02 retention (>100). Patient is DNI.  On Bipap. Diminished air movement / wheezing on exam despite solumedrol and nebulizers            -Admit to  stepdown - solumedrol 60mg  TID,  IV azithromycin, add Rocephin. Duoneb Q4 hours and albuterol nebs Q 2 prn. (both contraindicated in EPIC based on reported allergy to Budesonide but patient uses Duoneb at home)  -wean off bipap when able -hold home xanax (may exacerbate somnolence)  DM2.  -Hold home metformin -CBGs, sensitive SSI -A1c  Diastolic heart failure.  No evidence for volume at overload present . -resume lasix + potassium tomorrow ( suspect decreased PO fluids in last 24 hours) -daily weights -intake and output -resume home lasix in am  hyperlipidemia.   -resume home statin tomorrow if alert enough to take PO  HTN. Normotensive in ED Continue home Cozaar and lasix when more alert (scheduled for tomorrow)  DVT prophylaxis:   Lovenox   Code Status:     DNI. Per daughter it is okay to perform chest compressions, given ACLS meds and defibrillate but no intubation.  Family Communication:    Treatment plan discussed with  daughter in the room and she understands and agrees with the plan..  Disposition Plan:   Discharge home in 2-3 days              Consults  called:  none  Admission status:    Admission -  stepdown   Willette Cluster NP [facility] Pager [phone]  If 7PM-7AM, please contact night-coverage [email or web address] Password [facility]  10/17/2015, 3:08 PM

## 2015-10-17 NOTE — Progress Notes (Signed)
RT placed pt on BIPAP 20/4, 28% with no complications. No breakdown noted on face. Mask secure.

## 2015-10-17 NOTE — Progress Notes (Addendum)
Abg sample reported to Dr Verdie MosherLiu - pH 7.20, PCO2 > 100.0, PO2 25 by RT. MD aware of recollection needed. Per MD no further orders for repeat abg.

## 2015-10-18 DIAGNOSIS — I5032 Chronic diastolic (congestive) heart failure: Secondary | ICD-10-CM

## 2015-10-18 LAB — CBC
HEMATOCRIT: 33.1 % — AB (ref 36.0–46.0)
Hemoglobin: 9.2 g/dL — ABNORMAL LOW (ref 12.0–15.0)
MCH: 24.1 pg — ABNORMAL LOW (ref 26.0–34.0)
MCHC: 27.8 g/dL — AB (ref 30.0–36.0)
MCV: 86.9 fL (ref 78.0–100.0)
PLATELETS: 365 10*3/uL (ref 150–400)
RBC: 3.81 MIL/uL — ABNORMAL LOW (ref 3.87–5.11)
RDW: 16.6 % — AB (ref 11.5–15.5)
WBC: 8.5 10*3/uL (ref 4.0–10.5)

## 2015-10-18 LAB — BASIC METABOLIC PANEL
Anion gap: 11 (ref 5–15)
BUN: 19 mg/dL (ref 6–20)
CHLORIDE: 98 mmol/L — AB (ref 101–111)
CO2: 35 mmol/L — ABNORMAL HIGH (ref 22–32)
CREATININE: 0.76 mg/dL (ref 0.44–1.00)
Calcium: 9 mg/dL (ref 8.9–10.3)
GFR calc Af Amer: >60 mL/min (ref >60)
GLUCOSE: 148 mg/dL — AB (ref 65–99)
POTASSIUM: 4.4 mmol/L (ref 3.5–5.1)
Sodium: 144 mmol/L (ref 135–145)

## 2015-10-18 LAB — HEMOGLOBIN A1C
HEMOGLOBIN A1C: 6.4 % — AB (ref 4.8–5.6)
Mean Plasma Glucose: 137 mg/dL

## 2015-10-18 LAB — GLUCOSE, CAPILLARY
GLUCOSE-CAPILLARY: 116 mg/dL — AB (ref 65–99)
Glucose-Capillary: 122 mg/dL — ABNORMAL HIGH (ref 65–99)
Glucose-Capillary: 158 mg/dL — ABNORMAL HIGH (ref 65–99)
Glucose-Capillary: 213 mg/dL — ABNORMAL HIGH (ref 65–99)

## 2015-10-18 LAB — MRSA PCR SCREENING: MRSA by PCR: NEGATIVE

## 2015-10-18 MED ORDER — ALPRAZOLAM 0.5 MG PO TABS
0.5000 mg | ORAL_TABLET | Freq: Three times a day (TID) | ORAL | Status: DC | PRN
  Administered 2015-10-19 – 2015-10-23 (×6): 0.5 mg via ORAL
  Filled 2015-10-18 (×6): qty 1

## 2015-10-18 NOTE — Progress Notes (Addendum)
Initial Nutrition Assessment  DOCUMENTATION CODES:  Not applicable  INTERVENTION:  Magic cup BID with meals, each supplement provides 290 kcal and 9 grams of protein  High protein beverages (does not like the supplements)  Pt had numerous complaints and voiced repeatedly her discontent with the hospital and her dietary restrictions. She may benefit from a GOC discussion with palliative if not already had.   NUTRITION DIAGNOSIS:  Increased nutrient needs related to catabolic illness (End stage copd) as evidenced by estimated nutritional requirements for this disease state  GOAL:  Patient will meet greater than or equal to 90% of their needs  MONITOR:  PO intake, Supplement acceptance, Labs  REASON FOR ASSESSMENT:  Consult COPD Protocol  ASSESSMENT:  70 y/o female PMhx COPD, HTN, PVD, HF, Anxiety, PCM, HLD, Esophageal dysmotility/dysphagia, DM2, ongoing tobacco abuse. Presented with increasing somnolence. Admitted for COPD exacerbation  Pt reports she has a chronic loss of appetite. She could not say how much she eats at home though. She is a rather poor historian and does have trouble staying on topic.   She does not like the supplements at all. She believes they are much too Sweet. She has had glucerna before and also believes that is too sweet as well.  Denies N/V/C/D  She says she does like milk and would rather have this. She does not believe her secretions worsen with dairy. Will order this. She was agreeable to trying the [employer].   She says she has gained weight recently. Her wt loss on the MST report was from a distant past "I used to weigh over 180 lbs"  NFPE: Deferred  Pt voiced many complaints including how th bipap machine hurts her head, receiving two starches at lunch, her dietary restrictions, subjectively rude workers, her home health care agency, her Bipap machines that keep breaking and overall just being in the hospital. She began to tear up when talking  about how she is so limited in what she can eat in the hospital and how she never gets what she wants.   RD stated he would express her desire for a liberalized diet, but it is unlikely to be liberalized given her SEVERE respiratory distress. She sounded very tired of being in the hospital "I cant be here 4 or 5 days, I just cant". She may benefit from a conversation with palliative care to discuss if she would like to focus on more her perceived QOL than quantity.   Medications: IV abx, Ensure Enlive, lasix (20mg ), insulin, methyl prednisone, KCL, ppi Labs: a1c 9/22: 6.4, H/H:9.2/33.1  Recent Labs Lab 10/17/15 1211 10/18/15 0423  NA 143 144  K 4.4 4.4  CL 100* 98*  CO2 39* 35*  BUN 12 19  CREATININE 0.59 0.76  CALCIUM 9.0 9.0  GLUCOSE 178* 148*    Diet Order:  Diet heart healthy/carb modified Room service appropriate? Yes; Fluid consistency: Thin  Skin:  Reviewed, no issues  Last BM:  9/22  Height:  Ht Readings from Last 1 Encounters:  10/17/15 5\' 5"  (1.651 m)   Weight:  Wt Readings from Last 1 Encounters:  10/17/15 113 lb 8.6 oz (51.5 kg)   Wt Readings from Last 10 Encounters:  10/17/15 113 lb 8.6 oz (51.5 kg)  09/10/15 113 lb (51.3 kg)  08/01/15 112 lb (50.8 kg)  07/06/15 115 lb 9.6 oz (52.4 kg)  04/28/15 123 lb (55.8 kg)  04/11/15 111 lb (50.3 kg)  04/08/15 123 lb (55.8 kg)  04/07/15 120 lb (  54.4 kg)  04/07/15 118 lb (53.5 kg)  03/26/15 111 lb (50.3 kg)   Ideal Body Weight:  56.82 kg  BMI:  Body mass index is 18.89 kg/m.  Estimated Nutritional Needs:  Kcal:  1700-1900 kcals (33-37 kcal/kg bw) Protein:  70-80 g Pro (1.4-1.6 g/kg bw) Fluid:  1.6 liters (30 ml/kg bw)  EDUCATION NEEDS:  No education needs identified at this time  Christophe Louis RD, LDN, CNSC Clinical Nutrition Pager: [phone] 10/18/2015 11:54 AM

## 2015-10-18 NOTE — Progress Notes (Signed)
PROGRESS NOTE        PATIENT DETAILS Name: Nicole Maxwell Age: 70 y.o. Sex: female Date of Birth: 31-Mar-1945 Admit Date: 10/17/2015 Admitting Physician Starleen Arms, MD ZOX:WRUE, [name], MD  Brief Narrative: Patient is a 70 y.o. female with past medical history of COPD on home O2, chronic diastolic heart failure, ongoing tobacco abuse, OSA on CPAP (noncompliant for the past 1 month) admitted on 9/22 for acute on chronic hypercarbic respiratory failure secondary to COPD exacerbation.  Subjective: Shortness of breath which better. No longer on BiPAP.  Assessment/Plan: Active Problems: Acute on chronic hypoxic/hypercarbic respiratory failure: Required BiPAP on admission, she was liberated off BiPAP this morning. She has now been transitioned to nasal cannula, continue steroids, bronchodilators and empiric antibiotics. Continue nocturnal BiPAP  COPD exacerbation: Improving, still wheezing but moving air. Continue steroids, bronchodilators and empiric antibiotics.  Chronic diastolic heart failure: Euvolemic on exam, continue Lasix and follow volume status/weights.  Type 2 diabetes: Continue to hold metformin, CBGs stable on SSI.  Hypertension: Controlled, continue losartan and Lasix.  Dyslipidemia: Continue statin  Anxiety: Resume as needed Xanax-but at a decreased dose-may need to minimize as much as possible given hypercarbia on presentation.  Chronic pain: Patient claims she occasionally takes narcotics at home-but not on a regular basis. Denies that she took narcotics which cause her to be somnolent prior to this admission. As noted above, minimize narcotics and benzos as much as possible  GERD/esophageal dysmotility: Continue PPI  Tobacco abuse: Counseled  DVT Prophylaxis: Prophylactic Lovenox  Code Status: DNI  Family Communication: None at bedside  Disposition Plan: Remain inpatient-remain in stepdown for another 24 hours before being  transferred.  Antimicrobial agents: Rocephin/Zithromax 9/22>>  Procedures: None  CONSULTS:  None  Time spent: 25- minutes-Greater than 50% of this time was spent in counseling, explanation of diagnosis, planning of further management, and coordination of care.  MEDICATIONS: Anti-infectives    Start     Dose/Rate Route Frequency Ordered Stop   10/17/15 1700  cefTRIAXone (ROCEPHIN) 1 g in dextrose 5 % 50 mL IVPB     1 g 100 mL/hr over 30 Minutes Intravenous Every 24 hours 10/17/15 1654     10/17/15 1630  azithromycin (ZITHROMAX) 500 mg in dextrose 5 % 250 mL IVPB     500 mg 250 mL/hr over 60 Minutes Intravenous Every 24 hours 10/17/15 1629     10/17/15 1300  azithromycin (ZITHROMAX) 500 mg in dextrose 5 % 250 mL IVPB     500 mg 250 mL/hr over 60 Minutes Intravenous  Once 10/17/15 1249 10/17/15 1403      Scheduled Meds: . aspirin EC  81 mg Oral Daily  . atorvastatin  20 mg Oral Daily  . azithromycin  500 mg Intravenous Q24H  . cefTRIAXone (ROCEPHIN)  IV  1 g Intravenous Q24H  . DULoxetine  60 mg Oral Daily  . enoxaparin (LOVENOX) injection  40 mg Subcutaneous Q24H  . feeding supplement (ENSURE ENLIVE)  237 mL Oral BID BM  . fluticasone  1 spray Each Nare BID  . furosemide  20 mg Oral Daily  . insulin aspart  0-9 Units Subcutaneous TID WC  . ipratropium-albuterol  3 mL Nebulization Q4H  . latanoprost  1 drop Both Eyes QHS  . losartan  25 mg Oral Daily  . methylPREDNISolone (SOLU-MEDROL) injection  60 mg Intravenous  TID  . pantoprazole  40 mg Oral q morning - 10a  . potassium chloride SA  20 mEq Oral Daily   Continuous Infusions:  PRN Meds:.acetaminophen **OR** acetaminophen, albuterol, bisacodyl, morphine injection, ondansetron **OR** ondansetron (ZOFRAN) IV, polyethylene glycol, traZODone   PHYSICAL EXAM: Vital signs: Vitals:   10/18/15 0800 10/18/15 0851 10/18/15 1150 10/18/15 1220  BP:   118/85   Pulse:   (!) 107   Resp:   (!) 27   Temp: 98 F (36.7 C)  98.6  F (37 C)   TempSrc: Oral  Oral   SpO2:  97% 100% 100%  Weight:      Height:       Filed Weights   10/17/15 2115  Weight: 51.5 kg (113 lb 8.6 oz)   Body mass index is 18.89 kg/m.   General appearance :Awake, alert, Mildly tachypneic but not using accessory muscles and speaking in full sentences..  Eyes:, pupils equally reactive to light and accomodation,no scleral icterus.Pink conjunctiva HEENT: Atraumatic and Normocephalic Neck: supple, no JVD. No cervical lymphadenopathy. No thyromegaly Resp: Moving air bilaterally, coarse rhonchi all over CVS: S1 S2 regular, no murmurs.  GI: Bowel sounds present, Non tender and not distended with no gaurding, rigidity or rebound.No organomegaly Extremities: B/L Lower Ext shows no edema, both legs are warm to touch Neurology:  speech clear,Non focal, sensation is grossly intact. Psychiatric: Normal judgment and insight. Alert and oriented x 3. Normal mood. Musculoskeletal:gait appears to be normal.No digital cyanosis Skin:No Rash, warm and dry Wounds:N/A  I have personally reviewed following labs and imaging studies  LABORATORY DATA: CBC:  Recent Labs Lab 10/17/15 1211 10/18/15 0423  WBC 16.9* 8.5  NEUTROABS 11.6*  --   HGB 9.7* 9.2*  HCT 36.4 33.1*  MCV 91.0 86.9  PLT 402* 365    Basic Metabolic Panel:  Recent Labs Lab 10/17/15 1211 10/18/15 0423  NA 143 144  K 4.4 4.4  CL 100* 98*  CO2 39* 35*  GLUCOSE 178* 148*  BUN 12 19  CREATININE 0.59 0.76  CALCIUM 9.0 9.0    GFR: Estimated Creatinine Clearance: 52.4 mL/min (by C-G formula based on SCr of 0.76 mg/dL).  Liver Function Tests:  Recent Labs Lab 10/17/15 1211  AST 20  ALT 16  ALKPHOS 55  BILITOT 0.2*  PROT 6.4*  ALBUMIN 3.5   No results for input(s): LIPASE, AMYLASE in the last 168 hours. No results for input(s): AMMONIA in the last 168 hours.  Coagulation Profile: No results for input(s): INR, PROTIME in the last 168 hours.  Cardiac Enzymes: No  results for input(s): CKTOTAL, CKMB, CKMBINDEX, TROPONINI in the last 168 hours.  BNP (last 3 results) No results for input(s): PROBNP in the last 8760 hours.  HbA1C:  Recent Labs  10/17/15 1736  HGBA1C 6.4*    CBG:  Recent Labs Lab 10/17/15 1850 10/17/15 2207 10/18/15 0813 10/18/15 1220  GLUCAP 133* 279* 122* 116*    Lipid Profile: No results for input(s): CHOL, HDL, LDLCALC, TRIG, CHOLHDL, LDLDIRECT in the last 72 hours.  Thyroid Function Tests: No results for input(s): TSH, T4TOTAL, FREET4, T3FREE, THYROIDAB in the last 72 hours.  Anemia Panel: No results for input(s): VITAMINB12, FOLATE, FERRITIN, TIBC, IRON, RETICCTPCT in the last 72 hours.  Urine analysis:    Component Value Date/Time   COLORURINE YELLOW 03/15/2015 2336   APPEARANCEUR CLEAR 03/15/2015 2336   LABSPEC 1.020 03/15/2015 2336   PHURINE 5.5 03/15/2015 2336   GLUCOSEU NEGATIVE 03/15/2015 2336  HGBUR NEGATIVE 03/15/2015 2336   BILIRUBINUR NEGATIVE 03/15/2015 2336   KETONESUR NEGATIVE 03/15/2015 2336   PROTEINUR 30 (A) 03/15/2015 2336   NITRITE NEGATIVE 03/15/2015 2336   LEUKOCYTESUR NEGATIVE 03/15/2015 2336    Sepsis Labs: Lactic Acid, Venous    Component Value Date/Time   LATICACIDVEN 1.04 03/15/2015 1504    MICROBIOLOGY: Recent Results (from the past 240 hour(s))  MRSA PCR Screening     Status: None   Collection Time: 10/17/15  9:24 PM  Result Value Ref Range Status   MRSA by PCR NEGATIVE NEGATIVE Final    Comment:        The GeneXpert MRSA Assay (FDA approved for NASAL specimens only), is one component of a comprehensive MRSA colonization surveillance program. It is not intended to diagnose MRSA infection nor to guide or monitor treatment for MRSA infections.     RADIOLOGY STUDIES/RESULTS: Dg Chest Portable 1 View  Result Date: 10/17/2015 CLINICAL DATA:  Weakness and dyspnea EXAM: PORTABLE CHEST 1 VIEW COMPARISON:  09/10/2015 FINDINGS: Cardiac shadow is stable. The lungs  are well aerated bilaterally. No focal infiltrate or sizable effusion is seen. No bony abnormality is noted. IMPRESSION: No acute abnormality seen. Electronically Signed   By: Alcide Clever M.D.   On: 10/17/2015 12:30     LOS: 1 day   Jeoffrey Massed, MD  [facility] Pager:[phone]  If 7PM-7AM, please contact night-coverage [email or web address] Password [email or web address] 10/18/2015, 1:24 PM

## 2015-10-18 NOTE — Progress Notes (Signed)
Helped her  to get out of bed to the commode  and tried to fix the  entangle  cables when suddenly got mad at the undersigned  .Charge nurse talked to the pt.  calmed down at once.

## 2015-10-18 NOTE — Evaluation (Signed)
Physical Therapy Evaluation Patient Details Name: Nicole Maxwell MRN: [id number] DOB: Aug 23, 1945 Today's Date: 10/18/2015   History of Present Illness  Pt adm with resp failure and COPD exacerbation. Initially placed on bipap. PMH - copd, DM, HTN, OA, chronic back pain  Clinical Impression  Pt admitted with above diagnosis and presents to PT with functional limitations due to deficits listed below (See PT problem list). Pt needs skilled PT to maximize independence and safety to allow discharge to home alone. Expect pt will do well with continued mobility and be able to return home when medically ready.    Follow Up Recommendations No PT follow up    Equipment Recommendations  None recommended by PT    Recommendations for Other Services       Precautions / Restrictions Precautions Precautions: Fall Restrictions Weight Bearing Restrictions: No      Mobility  Bed Mobility Overal bed mobility: Modified Independent                Transfers Overall transfer level: Needs assistance Equipment used: None;4-wheeled walker Transfers: Sit to/from Stand;Stand Pivot Transfers Sit to Stand: Supervision Stand pivot transfers: Supervision       General transfer comment: Assist for safety and for lines  Ambulation/Gait Ambulation/Gait assistance: Min guard Ambulation Distance (Feet): 100 Feet Assistive device: 4-wheeled walker Gait Pattern/deviations: Step-through pattern;Drifts right/left     General Gait Details: Pt slightly unsteady but no overt loss of balance. Amb on 3L of O2 with SpO2 >94%  Stairs            Wheelchair Mobility    Modified Rankin (Stroke Patients Only)       Balance Overall balance assessment: Needs assistance Sitting-balance support: No upper extremity supported;Feet supported Sitting balance-Leahy Scale: Normal     Standing balance support: No upper extremity supported;During functional activity Standing balance-Leahy Scale: Fair                               Pertinent Vitals/Pain      Home Living Family/patient expects to be discharged to:: Private residence Living Arrangements: Other relatives Available Help at Discharge: Family;Available PRN/intermittently Type of Home: House Home Access: Level entry     Home Layout: One level Home Equipment: Walker - 2 wheels;Shower seat;Cane - single point;Grab bars - tub/shower      Prior Function Level of Independence: Independent with assistive device(s)         Comments: Uses RW or cane PRN     Hand Dominance   Dominant Hand: Right    Extremity/Trunk Assessment   Upper Extremity Assessment: Overall WFL for tasks assessed           Lower Extremity Assessment: Generalized weakness         Communication   Communication: No difficulties  Cognition Arousal/Alertness: Awake/alert Behavior During Therapy: Impulsive Overall Cognitive Status: Within Functional Limits for tasks assessed                      General Comments      Exercises     Assessment/Plan    PT Assessment Patient needs continued PT services  PT Problem List Decreased strength;Decreased activity tolerance;Decreased balance;Decreased mobility          PT Treatment Interventions DME instruction;Gait training;Functional mobility training;Therapeutic activities;Therapeutic exercise;Balance training;Patient/family education    PT Goals (Current goals can be found in the Care Plan section)  Acute Rehab PT  Goals Patient Stated Goal: return home PT Goal Formulation: With patient Time For Goal Achievement: 10/25/15 Potential to Achieve Goals: Good    Frequency Min 3X/week   Barriers to discharge        Co-evaluation               End of Session Equipment Utilized During Treatment: Oxygen Activity Tolerance: Patient limited by fatigue Patient left: in chair;with call bell/phone within reach;with chair alarm set Nurse Communication: Mobility  status         Time: 0943-1000 PT Time Calculation (min) (ACUTE ONLY): 17 min   Charges:   PT Evaluation $PT Eval Moderate Complexity: 1 Procedure     PT G Codes:        Deeksha Cotrell 11-13-15, 10:33 AM Skip Mayer PT [phone]

## 2015-10-19 LAB — BASIC METABOLIC PANEL
Anion gap: 8 (ref 5–15)
BUN: 19 mg/dL (ref 6–20)
CHLORIDE: 98 mmol/L — AB (ref 101–111)
CO2: 33 mmol/L — AB (ref 22–32)
CREATININE: 0.71 mg/dL (ref 0.44–1.00)
Calcium: 9.2 mg/dL (ref 8.9–10.3)
GFR calc non Af Amer: >60 mL/min (ref >60)
Glucose, Bld: 191 mg/dL — ABNORMAL HIGH (ref 65–99)
Potassium: 4.6 mmol/L (ref 3.5–5.1)
Sodium: 139 mmol/L (ref 135–145)

## 2015-10-19 LAB — GLUCOSE, CAPILLARY
GLUCOSE-CAPILLARY: 115 mg/dL — AB (ref 65–99)
GLUCOSE-CAPILLARY: 140 mg/dL — AB (ref 65–99)
GLUCOSE-CAPILLARY: 138 mg/dL — AB (ref 65–99)
Glucose-Capillary: 131 mg/dL — ABNORMAL HIGH (ref 65–99)

## 2015-10-19 MED ORDER — METHYLPREDNISOLONE SODIUM SUCC 40 MG IJ SOLR
40.0000 mg | Freq: Three times a day (TID) | INTRAMUSCULAR | Status: DC
  Administered 2015-10-19 – 2015-10-20 (×3): 40 mg via INTRAVENOUS
  Filled 2015-10-19 (×3): qty 1

## 2015-10-19 MED ORDER — GUAIFENESIN ER 600 MG PO TB12
600.0000 mg | ORAL_TABLET | Freq: Two times a day (BID) | ORAL | Status: DC
  Administered 2015-10-19 – 2015-10-24 (×11): 600 mg via ORAL
  Filled 2015-10-19 (×11): qty 1

## 2015-10-19 NOTE — Progress Notes (Addendum)
PROGRESS NOTE        PATIENT DETAILS Name: Nicole Maxwell Age: 70 y.o. Sex: female Date of Birth: Mar 27, 1945 Admit Date: 10/17/2015 Admitting Physician Starleen Armsawood S Elgergawy, MD ZOX:[id number]:Shaw, Chrissie NoaWilliam, MD  Brief Narrative: Patient is a 70 y.o. female with past medical history of COPD on home O2, chronic diastolic heart failure, ongoing tobacco abuse, OSA on CPAP (noncompliant for the past 1 month) admitted on 9/22 for acute on chronic hypercarbic respiratory failure secondary to COPD exacerbation.  Subjective: Shortness of breath slowly improving. +Cough  No chest pain No nausea/vomiting. which better.  Assessment/Plan: Active Problems: Acute on chronic hypoxic/hypercarbic respiratory failure: Required BiPAP on admission, she was liberated off BiPAP on 9/23 morning. She is now doing well on nasal cannula (on home O2), continue steroids, bronchodilators and empiric antibiotics. Continue nocturnal BiPAP  COPD exacerbation: Improving, still wheezing but moving air. Continue Solumedrol, bronchodilators and empiric antibiotics.  Chronic diastolic heart failure: Euvolemic on exam, continue Lasix and follow volume status/weights.  Type 2 diabetes: Continue to hold metformin, CBGs stable on SSI.  Hypertension: Controlled, continue losartan and Lasix.  Dyslipidemia: Continue statin  Anxiety: Resume as needed Xanax-but at a decreased dose-may need to minimize as much as possible given hypercarbia on presentation.  Chronic pain: Patient claims she occasionally takes narcotics at home-but not on a regular basis. Denies that she took narcotics which cause her to be somnolent prior to this admission. As noted above, minimize narcotics and benzos as much as possible  GERD/esophageal dysmotility: Continue PPI  Tobacco abuse: Counseled  DVT Prophylaxis: Prophylactic Lovenox  Code Status: DNI  Family Communication: None at bedside  Disposition Plan: Remain  inpatient-but transfer to telemetry-requires another 2-3 days of hospitalization prior to d/c  Antimicrobial agents: Rocephin/Zithromax 9/22>>  Procedures: None  CONSULTS:  None  Time spent: 25- minutes-Greater than 50% of this time was spent in counseling, explanation of diagnosis, planning of further management, and coordination of care.  MEDICATIONS: Anti-infectives    Start     Dose/Rate Route Frequency Ordered Stop   10/17/15 1700  cefTRIAXone (ROCEPHIN) 1 g in dextrose 5 % 50 mL IVPB     1 g 100 mL/hr over 30 Minutes Intravenous Every 24 hours 10/17/15 1654     10/17/15 1630  azithromycin (ZITHROMAX) 500 mg in dextrose 5 % 250 mL IVPB     500 mg 250 mL/hr over 60 Minutes Intravenous Every 24 hours 10/17/15 1629     10/17/15 1300  azithromycin (ZITHROMAX) 500 mg in dextrose 5 % 250 mL IVPB     500 mg 250 mL/hr over 60 Minutes Intravenous  Once 10/17/15 1249 10/17/15 1403      Scheduled Meds: . aspirin EC  81 mg Oral Daily  . atorvastatin  20 mg Oral Daily  . azithromycin  500 mg Intravenous Q24H  . cefTRIAXone (ROCEPHIN)  IV  1 g Intravenous Q24H  . DULoxetine  60 mg Oral Daily  . enoxaparin (LOVENOX) injection  40 mg Subcutaneous Q24H  . feeding supplement (ENSURE ENLIVE)  237 mL Oral BID BM  . fluticasone  1 spray Each Nare BID  . furosemide  20 mg Oral Daily  . guaiFENesin  600 mg Oral BID  . insulin aspart  0-9 Units Subcutaneous TID WC  . ipratropium-albuterol  3 mL Nebulization Q4H  . latanoprost  1 drop Both  Eyes QHS  . losartan  25 mg Oral Daily  . methylPREDNISolone (SOLU-MEDROL) injection  60 mg Intravenous TID  . pantoprazole  40 mg Oral q morning - 10a  . potassium chloride SA  20 mEq Oral Daily   Continuous Infusions:  PRN Meds:.acetaminophen **OR** acetaminophen, albuterol, alprazolam, bisacodyl, morphine injection, ondansetron **OR** ondansetron (ZOFRAN) IV, polyethylene glycol, traZODone   PHYSICAL EXAM: Vital signs: Vitals:   10/18/15 2332  10/19/15 0346 10/19/15 0402 10/19/15 0758  BP:   (!) 162/75   Pulse:   88   Resp:   (!) 37   Temp:   97.9 F (36.6 C) 98.3 F (36.8 C)  TempSrc:   Oral Oral  SpO2: 100% 100% 100% 97%  Weight:   47.6 kg (104 lb 15 oz)   Height:       Filed Weights   10/17/15 2115 10/19/15 0402  Weight: 51.5 kg (113 lb 8.6 oz) 47.6 kg (104 lb 15 oz)   Body mass index is 17.46 kg/m.   General appearance :Awake, alert, Not in any distress.  Eyes:, pupils equally reactive to light and accomodation,no scleral icterus.Pink conjunctiva HEENT: Atraumatic and Normocephalic Neck: supple, no JVD. No cervical lymphadenopathy. No thyromegaly Resp: Moving air bilaterally, coarse rhonchi all over CVS: S1 S2 regular, no murmurs.  GI: Bowel sounds present, Non tender and not distended with no gaurding, rigidity or rebound.No organomegaly Extremities: B/L Lower Ext shows no edema, both legs are warm to touch Neurology:  speech clear,Non focal, sensation is grossly intact. Psychiatric: Normal judgment and insight. Alert and oriented x 3. Normal mood. Musculoskeletal:gait appears to be normal.No digital cyanosis Skin:No Rash, warm and dry Wounds:N/A  I have personally reviewed following labs and imaging studies  LABORATORY DATA: CBC:  Recent Labs Lab 10/17/15 1211 10/18/15 0423  WBC 16.9* 8.5  NEUTROABS 11.6*  --   HGB 9.7* 9.2*  HCT 36.4 33.1*  MCV 91.0 86.9  PLT 402* 365    Basic Metabolic Panel:  Recent Labs Lab 10/17/15 1211 10/18/15 0423 10/19/15 0301  NA 143 144 139  K 4.4 4.4 4.6  CL 100* 98* 98*  CO2 39* 35* 33*  GLUCOSE 178* 148* 191*  BUN 12 19 19   CREATININE 0.59 0.76 0.71  CALCIUM 9.0 9.0 9.2    GFR: Estimated Creatinine Clearance: 48.5 mL/min (by C-G formula based on SCr of 0.71 mg/dL).  Liver Function Tests:  Recent Labs Lab 10/17/15 1211  AST 20  ALT 16  ALKPHOS 55  BILITOT 0.2*  PROT 6.4*  ALBUMIN 3.5   No results for input(s): LIPASE, AMYLASE in the last  168 hours. No results for input(s): AMMONIA in the last 168 hours.  Coagulation Profile: No results for input(s): INR, PROTIME in the last 168 hours.  Cardiac Enzymes: No results for input(s): CKTOTAL, CKMB, CKMBINDEX, TROPONINI in the last 168 hours.  BNP (last 3 results) No results for input(s): PROBNP in the last 8760 hours.  HbA1C:  Recent Labs  10/17/15 1736  HGBA1C 6.4*    CBG:  Recent Labs Lab 10/18/15 0813 10/18/15 1220 10/18/15 1633 10/18/15 2138 10/19/15 0746  GLUCAP 122* 116* 158* 213* 115*    Lipid Profile: No results for input(s): CHOL, HDL, LDLCALC, TRIG, CHOLHDL, LDLDIRECT in the last 72 hours.  Thyroid Function Tests: No results for input(s): TSH, T4TOTAL, FREET4, T3FREE, THYROIDAB in the last 72 hours.  Anemia Panel: No results for input(s): VITAMINB12, FOLATE, FERRITIN, TIBC, IRON, RETICCTPCT in the last 72 hours.  Urine  analysis:    Component Value Date/Time   COLORURINE YELLOW 03/15/2015 2336   APPEARANCEUR CLEAR 03/15/2015 2336   LABSPEC 1.020 03/15/2015 2336   PHURINE 5.5 03/15/2015 2336   GLUCOSEU NEGATIVE 03/15/2015 2336   HGBUR NEGATIVE 03/15/2015 2336   BILIRUBINUR NEGATIVE 03/15/2015 2336   KETONESUR NEGATIVE 03/15/2015 2336   PROTEINUR 30 (A) 03/15/2015 2336   NITRITE NEGATIVE 03/15/2015 2336   LEUKOCYTESUR NEGATIVE 03/15/2015 2336    Sepsis Labs: Lactic Acid, Venous    Component Value Date/Time   LATICACIDVEN 1.04 03/15/2015 1504    MICROBIOLOGY: Recent Results (from the past 240 hour(s))  MRSA PCR Screening     Status: None   Collection Time: 10/17/15  9:24 PM  Result Value Ref Range Status   MRSA by PCR NEGATIVE NEGATIVE Final    Comment:        The GeneXpert MRSA Assay (FDA approved for NASAL specimens only), is one component of a comprehensive MRSA colonization surveillance program. It is not intended to diagnose MRSA infection nor to guide or monitor treatment for MRSA infections.     RADIOLOGY  STUDIES/RESULTS: Dg Chest Portable 1 View  Result Date: 10/17/2015 CLINICAL DATA:  Weakness and dyspnea EXAM: PORTABLE CHEST 1 VIEW COMPARISON:  09/10/2015 FINDINGS: Cardiac shadow is stable. The lungs are well aerated bilaterally. No focal infiltrate or sizable effusion is seen. No bony abnormality is noted. IMPRESSION: No acute abnormality seen. Electronically Signed   By: Alcide Clever M.D.   On: 10/17/2015 12:30     LOS: 2 days   Jeoffrey Massed, MD  [facility] Pager:[phone]  If 7PM-7AM, please contact night-coverage [email or web address] Password [facility] 10/19/2015, 8:36 AM

## 2015-10-19 NOTE — Progress Notes (Signed)
Patient on nasal cannula when RT entered room. Patient refused to go back on BIPAP at this time. Patient did tolerate breathing treatment well. RT will monitor patient as needed.

## 2015-10-19 NOTE — Progress Notes (Signed)
Pt with tele orders, but continuously refusing to wear it and pulls it off. BP elevated 172/74. MD is aware and wants to continue to monitor and be notified if systolic is above 180. No new orders at this time.

## 2015-10-19 NOTE — Progress Notes (Signed)
Pt reported pain rating 8/10, generalized. Pt only has orders for tylenol that she refused. Nurse informed pt that MD could be contacted about pain management in which pt responded she did not want anything for pain at this time. Nurse informed pt to call should she want something for pain.

## 2015-10-20 LAB — GLUCOSE, CAPILLARY
GLUCOSE-CAPILLARY: 96 mg/dL (ref 65–99)
GLUCOSE-CAPILLARY: 170 mg/dL — AB (ref 65–99)
GLUCOSE-CAPILLARY: 136 mg/dL — AB (ref 65–99)
Glucose-Capillary: 213 mg/dL — ABNORMAL HIGH (ref 65–99)

## 2015-10-20 MED ORDER — AMLODIPINE BESYLATE 10 MG PO TABS
10.0000 mg | ORAL_TABLET | Freq: Every day | ORAL | Status: DC
  Administered 2015-10-20 – 2015-10-24 (×5): 10 mg via ORAL
  Filled 2015-10-20 (×5): qty 1

## 2015-10-20 MED ORDER — METHYLPREDNISOLONE SODIUM SUCC 125 MG IJ SOLR
60.0000 mg | Freq: Three times a day (TID) | INTRAMUSCULAR | Status: DC
  Administered 2015-10-20 – 2015-10-21 (×4): 60 mg via INTRAVENOUS
  Filled 2015-10-20 (×4): qty 2

## 2015-10-20 NOTE — Progress Notes (Signed)
PROGRESS NOTE        PATIENT DETAILS Name: Nicole Maxwell Age: 70 y.o. Sex: female Date of Birth: 1945/05/24 Admit Date: 10/17/2015 Admitting Physician Starleen Arms, MD QMV:HQIO, [name], MD  Brief Narrative: Patient is a 70 y.o. female with past medical history of COPD on home O2, chronic diastolic heart failure, ongoing tobacco abuse, OSA on CPAP (noncompliant for the past 1 month) admitted on 9/22 for acute on chronic hypercarbic respiratory failure secondary to COPD exacerbation   Subjective: Still coughing, breathing improving and wheezing less  Assessment/Plan: Active Problems: Acute on chronic hypoxic/hypercarbic respiratory failure: - Required BiPAP on admission, now off - due to COPD flare and untreated OSA - continue Rocephin/zithromax today change to PO levaquin tomorrow - CXR was clear -continue IV solumedrol, nebs, Weaned O2 to 2.5L -CM consulted to request home CPAP eval  COPD exacerbation -as above  Tobacco abuse -counseled, she has cut down but still smokes despite using O2, educated pt on the dangers with this  Chronic diastolic heart failure:  -Euvolemic on exam, continue Lasix PO  Type 2 diabetes: -hold metformin, CBGs stable on SSI.  Hypertension: -BP high today, continue losartan and Lasix, add amlodipine  Dyslipidemia: - Continue statin  Anxiety: - Resume as needed Xanax-but at a decreased dose  Chronic pain:Pt reported to previous MD that she occasionally takes narcotics at home-but not on a regular basis. Denies that she took narcotics which cause her to be somnolent prior to this admission. As noted above, minimize sedating meds as much as possible  GERD/esophageal dysmotility: Continue PPI  Tobacco abuse: Counseled  DVT Prophylaxis:Lovenox  Code Status:DNI Family Communication:None at bedside Disposition Plan:Home in 1-2days  Antimicrobial agents: Rocephin/Zithromax  9/22>>  Procedures: None  CONSULTS:  None  Time spent: 25- minutes-Greater than 50% of this time was spent in counseling, explanation of diagnosis, planning of further management, and coordination of care.  MEDICATIONS: Anti-infectives    Start     Dose/Rate Route Frequency Ordered Stop   10/17/15 1700  cefTRIAXone (ROCEPHIN) 1 g in dextrose 5 % 50 mL IVPB     1 g 100 mL/hr over 30 Minutes Intravenous Every 24 hours 10/17/15 1654     10/17/15 1630  azithromycin (ZITHROMAX) 500 mg in dextrose 5 % 250 mL IVPB     500 mg 250 mL/hr over 60 Minutes Intravenous Every 24 hours 10/17/15 1629     10/17/15 1300  azithromycin (ZITHROMAX) 500 mg in dextrose 5 % 250 mL IVPB     500 mg 250 mL/hr over 60 Minutes Intravenous  Once 10/17/15 1249 10/17/15 1403      Scheduled Meds: . aspirin EC  81 mg Oral Daily  . atorvastatin  20 mg Oral Daily  . azithromycin  500 mg Intravenous Q24H  . cefTRIAXone (ROCEPHIN)  IV  1 g Intravenous Q24H  . DULoxetine  60 mg Oral Daily  . enoxaparin (LOVENOX) injection  40 mg Subcutaneous Q24H  . feeding supplement (ENSURE ENLIVE)  237 mL Oral BID BM  . fluticasone  1 spray Each Nare BID  . furosemide  20 mg Oral Daily  . guaiFENesin  600 mg Oral BID  . insulin aspart  0-9 Units Subcutaneous TID WC  . ipratropium-albuterol  3 mL Nebulization Q4H  . latanoprost  1 drop Both Eyes QHS  . losartan  25 mg  Oral Daily  . methylPREDNISolone (SOLU-MEDROL) injection  40 mg Intravenous TID  . pantoprazole  40 mg Oral q morning - 10a  . potassium chloride SA  20 mEq Oral Daily   Continuous Infusions:  PRN Meds:.acetaminophen **OR** acetaminophen, albuterol, alprazolam, bisacodyl, ondansetron **OR** ondansetron (ZOFRAN) IV, polyethylene glycol, traZODone   PHYSICAL EXAM: Vital signs: Vitals:   10/19/15 2110 10/20/15 0537 10/20/15 0539 10/20/15 0958  BP: (!) 167/73 (!) 189/75    Pulse: 92 86    Resp: 20 18    Temp: 98.1 F (36.7 C) 97.7 F (36.5 C)     TempSrc: Oral Oral    SpO2: 95% 99%  98%  Weight:   52.2 kg (115 lb 1.3 oz)   Height:       Filed Weights   10/17/15 2115 10/19/15 0402 10/20/15 0539  Weight: 51.5 kg (113 lb 8.6 oz) 47.6 kg (104 lb 15 oz) 52.2 kg (115 lb 1.3 oz)   Body mass index is 19.15 kg/m.   General appearance :Awake, alert, oriented x3, sitting in chair Eyes:,PERRLA Neck: supple, no JVD. Resp: poor air movement and rare exp wheezes and few ronchi CVS: S1 S2 regular, no murmurs.  GI: Bowel sounds present, Non tender and not distended with no gaurding, rigidity or rebound.No organomegaly Extremities: B/L Lower Ext shows no edema, both legs are warm to touch Neurology:  speech clear,Non focal, sensation is grossly intact. Psychiatric: Normal judgment and insight. Alert and oriented x 3. Normal mood. Skin:No Rash, warm and dry  I have personally reviewed following labs and imaging studies  LABORATORY DATA: CBC:  Recent Labs Lab 10/17/15 1211 10/18/15 0423  WBC 16.9* 8.5  NEUTROABS 11.6*  --   HGB 9.7* 9.2*  HCT 36.4 33.1*  MCV 91.0 86.9  PLT 402* 365    Basic Metabolic Panel:  Recent Labs Lab 10/17/15 1211 10/18/15 0423 10/19/15 0301  NA 143 144 139  K 4.4 4.4 4.6  CL 100* 98* 98*  CO2 39* 35* 33*  GLUCOSE 178* 148* 191*  BUN 12 19 19   CREATININE 0.59 0.76 0.71  CALCIUM 9.0 9.0 9.2    GFR: Estimated Creatinine Clearance: 53.2 mL/min (by C-G formula based on SCr of 0.71 mg/dL).  Liver Function Tests:  Recent Labs Lab 10/17/15 1211  AST 20  ALT 16  ALKPHOS 55  BILITOT 0.2*  PROT 6.4*  ALBUMIN 3.5   No results for input(s): LIPASE, AMYLASE in the last 168 hours. No results for input(s): AMMONIA in the last 168 hours.  Coagulation Profile: No results for input(s): INR, PROTIME in the last 168 hours.  Cardiac Enzymes: No results for input(s): CKTOTAL, CKMB, CKMBINDEX, TROPONINI in the last 168 hours.  BNP (last 3 results) No results for input(s): PROBNP in the last  8760 hours.  HbA1C:  Recent Labs  10/17/15 1736  HGBA1C 6.4*    CBG:  Recent Labs Lab 10/19/15 0746 10/19/15 1208 10/19/15 1641 10/19/15 2108 10/20/15 0809  GLUCAP 115* 131* 140* 138* 96    Lipid Profile: No results for input(s): CHOL, HDL, LDLCALC, TRIG, CHOLHDL, LDLDIRECT in the last 72 hours.  Thyroid Function Tests: No results for input(s): TSH, T4TOTAL, FREET4, T3FREE, THYROIDAB in the last 72 hours.  Anemia Panel: No results for input(s): VITAMINB12, FOLATE, FERRITIN, TIBC, IRON, RETICCTPCT in the last 72 hours.  Urine analysis:    Component Value Date/Time   COLORURINE YELLOW 03/15/2015 2336   APPEARANCEUR CLEAR 03/15/2015 2336   LABSPEC 1.020 03/15/2015 2336  PHURINE 5.5 03/15/2015 2336   GLUCOSEU NEGATIVE 03/15/2015 2336   HGBUR NEGATIVE 03/15/2015 2336   BILIRUBINUR NEGATIVE 03/15/2015 2336   KETONESUR NEGATIVE 03/15/2015 2336   PROTEINUR 30 (A) 03/15/2015 2336   NITRITE NEGATIVE 03/15/2015 2336   LEUKOCYTESUR NEGATIVE 03/15/2015 2336    Sepsis Labs: Lactic Acid, Venous    Component Value Date/Time   LATICACIDVEN 1.04 03/15/2015 1504    MICROBIOLOGY: Recent Results (from the past 240 hour(s))  MRSA PCR Screening     Status: None   Collection Time: 10/17/15  9:24 PM  Result Value Ref Range Status   MRSA by PCR NEGATIVE NEGATIVE Final    Comment:        The GeneXpert MRSA Assay (FDA approved for NASAL specimens only), is one component of a comprehensive MRSA colonization surveillance program. It is not intended to diagnose MRSA infection nor to guide or monitor treatment for MRSA infections.     RADIOLOGY STUDIES/RESULTS: Dg Chest Portable 1 View  Result Date: 10/17/2015 CLINICAL DATA:  Weakness and dyspnea EXAM: PORTABLE CHEST 1 VIEW COMPARISON:  09/10/2015 FINDINGS: Cardiac shadow is stable. The lungs are well aerated bilaterally. No focal infiltrate or sizable effusion is seen. No bony abnormality is noted. IMPRESSION: No acute  abnormality seen. Electronically Signed   By: Alcide CleverMark  Lukens M.D.   On: 10/17/2015 12:30     LOS: 3 days   Zannie CoveJOSEPH,Etana Beets, MD  [facility] Pager:[phone]  If 7PM-7AM, please contact night-coverage [email or web address] Password [email or web address] 10/20/2015, 11:35 AM

## 2015-10-20 NOTE — Progress Notes (Signed)
Physical Therapy Treatment Patient Details Name: Nicole Maxwell MRN: [id number] DOB: [birth year] Today's Date: 10/20/2015    History of Present Illness Pt adm with resp failure and COPD exacerbation. Initially placed on bipap. PMH - copd, DM, HTN, OA, chronic back pain    PT Comments    Patient tolerated increased gait distance this session and required 1 seated/1 standing rest break. SpO2 remained 87-94% throughout session on 2L O2 via nasal canula. Current plan remains appropriate.   Follow Up Recommendations  No PT follow up     Equipment Recommendations  None recommended by PT    Recommendations for Other Services       Precautions / Restrictions Precautions Precautions: Fall Restrictions Weight Bearing Restrictions: No    Mobility  Bed Mobility Overal bed mobility: Modified Independent                Transfers Overall transfer level: Modified independent Equipment used: None Transfers: Sit to/from Stand           General transfer comment: increased time  Ambulation/Gait Ambulation/Gait assistance: Supervision Ambulation Distance (Feet): 200 Feet Assistive device: Rolling walker (2 wheeled) Gait Pattern/deviations: Step-through pattern;Decreased stride length     General Gait Details: cues for safety and use of AD; drifts R/L with head turns but no LOB; one seated and one standing rest break required   Stairs            Wheelchair Mobility    Modified Rankin (Stroke Patients Only)       Balance     Sitting balance-Leahy Scale: Normal       Standing balance-Leahy Scale: Fair                      Cognition Arousal/Alertness: Awake/alert Behavior During Therapy: WFL for tasks assessed/performed Overall Cognitive Status: Within Functional Limits for tasks assessed                      Exercises      General Comments General comments (skin integrity, edema, etc.): SpO2 between 87-94% on 2L O2 throughout session;  discussed activity progression, self monitoring for rest breaks, pursed lip breathing, and using rollator       Pertinent Vitals/Pain Pain Assessment: Faces Faces Pain Scale: Hurts little more Pain Location: bilat feet with mobility Pain Descriptors / Indicators: Discomfort;Pins and needles;Sore Pain Intervention(s): Limited activity within patient's tolerance;Monitored during session;Repositioned    Home Living                      Prior Function            PT Goals (current goals can now be found in the care plan section) Acute Rehab PT Goals Patient Stated Goal: return home PT Goal Formulation: With patient Time For Goal Achievement: 10/25/15 Potential to Achieve Goals: Good Progress towards PT goals: Progressing toward goals    Frequency    Min 3X/week      PT Plan Current plan remains appropriate    Co-evaluation             End of Session Equipment Utilized During Treatment: Oxygen;Gait belt Activity Tolerance: Patient tolerated treatment well Patient left: in chair;with call bell/phone within reach;with chair alarm set     Time: 2841-32441135-1154 PT Time Calculation (min) (ACUTE ONLY): 19 min  Charges:  $Gait Training: 8-22 mins  G Codes:      Derek Mound, PTA Pager: [phone]   10/20/2015, 12:07 PM

## 2015-10-20 NOTE — Care Management Note (Signed)
Case Management Note  Patient Details  Name: Nicole Maxwell MRN: [id number] Date of Birth: 11-Jul-1945  Subjective/Objective:                 Spoke with patient at the bedside, daughter [name], and [employer]. Patient has home oxygen through [employer], [facility] assessed machine and stated it was working. Patient got machine recently at discharge form [facility] through Dr Sandria Manlyamaswany for hypercapnia (not OSA). Daughter thinks settings may need to be adjusted, but provided information that machine does appear to work. Daughter brought machine and it is in patient's closet in her hospital room. [place] in [place], her daughter provides a lot of support although she lives in [place], [place]. Patient states she has all needed DME, and [facility] and would like to follow up with them again at discharge.    Action/Plan:  CPAP may need adjustments to settings, Needs HH orders and referral to [facility] at DC.  Expected Discharge Date:  10/20/15               Expected Discharge Plan:  Home w Home Health Services  In-House Referral:     Discharge planning Services  CM Consult  Post Acute Care Choice:    Choice offered to:     DME Arranged:    DME Agency:     HH Arranged:    HH Agency:     Status of Service:  In process, will continue to follow  If discussed at Long Length of Stay Meetings, dates discussed:    Additional Comments:  Lawerance SabalDebbie Aaren Krog, RN 10/20/2015, 2:38 PM

## 2015-10-20 NOTE — Progress Notes (Signed)
Patient agreed to wear BIPAP for sleep, but before set up was complete patient became confused and subsequently refused BIPAP therapy and also refused to put nasal cannula on.  RN aware, RT will attempt placement again once patient is calm and cooperative.

## 2015-10-20 NOTE — Evaluation (Signed)
Occupational Therapy Evaluation Patient Details Name: AGNIESZKA NEWHOUSE MRN: [id number] DOB: Jan 08, 1946 Today's Date: 10/20/2015    History of Present Illness Pt adm with resp failure and COPD exacerbation. Initially placed on bipap. PMH - copd, DM, HTN, OA, chronic back pain   Clinical Impression   Pt with decline in function and safety with ADLs and ADL mobility with decreased strength, balance, endurance and cognition. Eval limited due to pt fatiguing easily with O2 SATs 86-92%. Pt would benefit from acute OT services to address impairments to increase level of function and safety    Follow Up Recommendations  No OT follow up;Supervision/Assistance - 24 hour    Equipment Recommendations  None recommended by OT    Recommendations for Other Services       Precautions / Restrictions Precautions Precautions: Fall Restrictions Weight Bearing Restrictions: No      Mobility Bed Mobility Overal bed mobility: Modified Independent             General bed mobility comments: pt attempting to out of recliner without assist upon arrival  Transfers Overall transfer level: Modified independent Equipment used: None Transfers: Sit to/from Stand           General transfer comment: increased time    Balance     Sitting balance-Leahy Scale: Good       Standing balance-Leahy Scale: Fair                              ADL Overall ADL's : Needs assistance/impaired     Grooming: Wash/dry hands;Wash/dry face;Standing;Min guard   Upper Body Bathing: Min guard;Sitting   Lower Body Bathing: Minimal assistance;Min guard   Upper Body Dressing : Min guard;Sitting   Lower Body Dressing: Min guard;Minimal assistance   Toilet Transfer: Supervision/safety;Ambulation;Comfort height toilet;Grab bars   Toileting- Clothing Manipulation and Hygiene: Min guard;Sit to/from stand   Tub/ Engineer, structural: Min guard   Functional mobility during ADLs: Min  guard;Supervision/safety General ADL Comments: pt fatigues easily, confusion     Vision Vision Assessment?: No apparent visual deficits              Pertinent Vitals/Pain Pain Assessment: Faces Faces Pain Scale: Hurts little more Pain Location: B feet Pain Descriptors / Indicators: Grimacing;Guarding;Sore;Discomfort Pain Intervention(s): Limited activity within patient's tolerance;Monitored during session;Repositioned     Hand Dominance Right   Extremity/Trunk Assessment Upper Extremity Assessment Upper Extremity Assessment: Generalized weakness   Lower Extremity Assessment Lower Extremity Assessment: Defer to PT evaluation   Cervical / Trunk Assessment Cervical / Trunk Assessment: Normal   Communication Communication Communication: No difficulties   Cognition Arousal/Alertness: Awake/alert Behavior During Therapy: Impulsive Overall Cognitive Status: Impaired/Different from baseline Area of Impairment: Orientation;Memory;Safety/judgement;Awareness Orientation Level: Disoriented to;Place;Time;Situation   Memory: Decreased short-term memory   Safety/Judgement: Decreased awareness of deficits Awareness: Intellectual       General Comments   pt pleasant, cooperative, confusion                 Home Living Family/patient expects to be discharged to:: Private residence Living Arrangements: Other relatives Available Help at Discharge: Family;Available PRN/intermittently Type of Home: House Home Access: Level entry     Home Layout: One level     Bathroom Shower/Tub: Chief Strategy Officer: Standard     Home Equipment: Environmental consultant - 2 wheels;Shower seat;Cane - single point;Grab bars - tub/shower   Additional Comments: obtained most info from PT eval, pt  with confusion and inaccurate answers      Prior Functioning/Environment Level of Independence: Independent with assistive device(s)        Comments: Uses RW or cane PRN, states she was  independent with ADLs        OT Problem List: Decreased strength;Decreased activity tolerance;Decreased knowledge of use of DME or AE;Pain;Decreased cognition;Impaired balance (sitting and/or standing);Decreased safety awareness   OT Treatment/Interventions:      OT Goals(Current goals can be found in the care plan section) Acute Rehab OT Goals Patient Stated Goal: return home OT Goal Formulation: With patient Time For Goal Achievement: 10/27/15 Potential to Achieve Goals: Good ADL Goals Pt Will Perform Grooming: with set-up;with supervision;standing Pt Will Perform Upper Body Bathing: with set-up;with supervision;standing;sitting Pt Will Perform Lower Body Bathing: with supervision;with set-up;sit to/from stand;sitting/lateral leans;with adaptive equipment Pt Will Perform Upper Body Dressing: with supervision;with set-up;standing;sitting Pt Will Perform Lower Body Dressing: with adaptive equipment;sitting/lateral leans;sit to/from stand;with supervision;with set-up Pt Will Transfer to Toilet: with modified independence;ambulating;regular height toilet;grab bars;with supervision Pt Will Perform Toileting - Clothing Manipulation and hygiene: with supervision;sitting/lateral leans;sit to/from stand Pt Will Perform Tub/Shower Transfer: shower seat;ambulating;with supervision;with modified independence Additional ADL Goal #1: Pt will verbalize and demo energy conservation techniques during ADLs and ADL mobility  OT Frequency:     Barriers to D/C:  no barriers                        End of Session    Activity Tolerance: Patient limited by fatigue Patient left: in bed;with call bell/phone within reach;with bed alarm set   Time: 1357-1413 OT Time Calculation (min): 16 min Charges:  OT General Charges $OT Visit: 1 Procedure OT Evaluation $OT Eval Moderate Complexity: 1 Procedure G-Codes:    Galen ManilaSpencer, Robey Massmann Jeanette 10/20/2015, 2:23 PM

## 2015-10-20 NOTE — Care Management Important Message (Signed)
Important Message  Patient Details  Name: Nicole Maxwell MRN: [id number] Date of Birth: [birth year]   Medicare Important Message Given:  Yes    [name], [name] 10/20/2015, 11:27 AM

## 2015-10-21 LAB — BASIC METABOLIC PANEL
ANION GAP: 7 (ref 5–15)
BUN: 24 mg/dL — ABNORMAL HIGH (ref 6–20)
CALCIUM: 9 mg/dL (ref 8.9–10.3)
CHLORIDE: 96 mmol/L — AB (ref 101–111)
CO2: 35 mmol/L — AB (ref 22–32)
CREATININE: 0.69 mg/dL (ref 0.44–1.00)
GFR calc non Af Amer: >60 mL/min (ref >60)
Glucose, Bld: 162 mg/dL — ABNORMAL HIGH (ref 65–99)
Potassium: 5 mmol/L (ref 3.5–5.1)
SODIUM: 138 mmol/L (ref 135–145)

## 2015-10-21 LAB — CBC
HCT: 35 % — ABNORMAL LOW (ref 36.0–46.0)
HEMOGLOBIN: 9.6 g/dL — AB (ref 12.0–15.0)
MCH: 23.8 pg — AB (ref 26.0–34.0)
MCHC: 27.4 g/dL — ABNORMAL LOW (ref 30.0–36.0)
MCV: 86.8 fL (ref 78.0–100.0)
PLATELETS: 357 10*3/uL (ref 150–400)
RBC: 4.03 MIL/uL (ref 3.87–5.11)
RDW: 16.3 % — ABNORMAL HIGH (ref 11.5–15.5)
WBC: 14.7 10*3/uL — AB (ref 4.0–10.5)

## 2015-10-21 LAB — GLUCOSE, CAPILLARY
GLUCOSE-CAPILLARY: 119 mg/dL — AB (ref 65–99)
GLUCOSE-CAPILLARY: 148 mg/dL — AB (ref 65–99)
Glucose-Capillary: 138 mg/dL — ABNORMAL HIGH (ref 65–99)
Glucose-Capillary: 175 mg/dL — ABNORMAL HIGH (ref 65–99)

## 2015-10-21 MED ORDER — POTASSIUM CHLORIDE CRYS ER 20 MEQ PO TBCR: 40.0000 meq | EXTENDED_RELEASE_TABLET | Freq: Every day | ORAL | Status: DC

## 2015-10-21 MED ORDER — LEVOFLOXACIN 500 MG PO TABS
500.0000 mg | ORAL_TABLET | Freq: Every day | ORAL | Status: DC
  Administered 2015-10-21 – 2015-10-24 (×4): 500 mg via ORAL
  Filled 2015-10-21 (×4): qty 1

## 2015-10-21 MED ORDER — METHYLPREDNISOLONE SODIUM SUCC 40 MG IJ SOLR
40.0000 mg | Freq: Two times a day (BID) | INTRAMUSCULAR | Status: DC
  Administered 2015-10-22: 40 mg via INTRAVENOUS
  Filled 2015-10-21: qty 1

## 2015-10-21 MED ORDER — FUROSEMIDE 40 MG PO TABS
40.0000 mg | ORAL_TABLET | Freq: Every day | ORAL | Status: DC
  Administered 2015-10-22 – 2015-10-24 (×3): 40 mg via ORAL
  Filled 2015-10-21 (×3): qty 1

## 2015-10-21 NOTE — Progress Notes (Addendum)
PROGRESS NOTE        PATIENT DETAILS Name: Nicole Maxwell Age: 70 y.o. Sex: female Date of Birth: [birth year] Admit Date: 10/17/2015 Admitting Physician Nicole Armsawood S Elgergawy, MD MV[facility]IONPCP:Shaw, Chrissie NoaWilliam, MD  Brief Narrative: Patient is a 70 y.o. female with past medical history of Severe GOLD STAGE 4 COPD/Chronic resp failure on home O2, chronic diastolic heart failure, ongoing tobacco abuse, OSA on CPAP (noncompliant for the past 1 month) admitted on 9/22 for acute on chronic hypercarbic respiratory failure secondary to COPD exacerbation. Improving with Nebs, Abx, Steroids-very slow to improve and significant symptom burden  Subjective: Still short of breath at times, but a little better, doesn't feel she's at baseline yet  Assessment/Plan: Active Problems: Acute on chronic hypoxic/hypercarbic respiratory failure: - Required BiPAP on admission, now off - due to COPD flare and untreated OSA - has been on IV  Rocephin/zithromax,  change to PO levaquin today - CXR was clear - cut down IV solumedrol slowly, nebs, Weaned O2 to 2.5L -CM consulted to request home CPAP eval, daughter brought home CPAP and I have requested Staff to use this inpatient  COPD exacerbation/Severe COPD Gold stage 4 -as above  Tobacco abuse -counseled, she has cut down but still smokes despite using O2, educated pt on the dangers with this  Chronic diastolic heart failure:  -continue Lasix PO-increase to 40mg , few basilar crackles on exam today  Type 2 diabetes: -hold metformin, CBGs stable on SSI.  Hypertension: -BP better continue losartan and Lasix, added amlodipine  Dyslipidemia: - Continue statin  Anxiety: - Resume as needed Xanax-but at a decreased dose  Chronic pain:Pt reported to previous MD that she occasionally takes narcotics at home-but not on a regular basis. Per daughter not on narcotics for >1year  GERD/esophageal dysmotility: Continue PPI  Tobacco abuse:  Counseled  DVT Prophylaxis:Lovenox  Code Status:DNI Family Communication:None at bedside, called and d/w daughter Nicole Maxwell 9/25 Disposition Plan:Home in 1-2days  Antimicrobial agents: Rocephin/Zithromax 9/22>>  Procedures: None  CONSULTS:  None  Time spent: 35min  MEDICATIONS: Anti-infectives    Start     Dose/Rate Route Frequency Ordered Stop   10/17/15 1700  cefTRIAXone (ROCEPHIN) 1 g in dextrose 5 % 50 mL IVPB     1 g 100 mL/hr over 30 Minutes Intravenous Every 24 hours 10/17/15 1654     10/17/15 1630  azithromycin (ZITHROMAX) 500 mg in dextrose 5 % 250 mL IVPB     500 mg 250 mL/hr over 60 Minutes Intravenous Every 24 hours 10/17/15 1629     10/17/15 1300  azithromycin (ZITHROMAX) 500 mg in dextrose 5 % 250 mL IVPB     500 mg 250 mL/hr over 60 Minutes Intravenous  Once 10/17/15 1249 10/17/15 1403      Scheduled Meds: . amLODipine  10 mg Oral Daily  . aspirin EC  81 mg Oral Daily  . atorvastatin  20 mg Oral Daily  . azithromycin  500 mg Intravenous Q24H  . cefTRIAXone (ROCEPHIN)  IV  1 g Intravenous Q24H  . DULoxetine  60 mg Oral Daily  . enoxaparin (LOVENOX) injection  40 mg Subcutaneous Q24H  . feeding supplement (ENSURE ENLIVE)  237 mL Oral BID BM  . fluticasone  1 spray Each Nare BID  . furosemide  20 mg Oral Daily  . guaiFENesin  600 mg Oral BID  . insulin aspart  0-9 Units Subcutaneous TID WC  . ipratropium-albuterol  3 mL Nebulization Q4H  . latanoprost  1 drop Both Eyes QHS  . losartan  25 mg Oral Daily  . methylPREDNISolone (SOLU-MEDROL) injection  60 mg Intravenous TID  . pantoprazole  40 mg Oral q morning - 10a  . potassium chloride SA  20 mEq Oral Daily   Continuous Infusions:  PRN Meds:.acetaminophen **OR** acetaminophen, albuterol, alprazolam, bisacodyl, ondansetron **OR** ondansetron (ZOFRAN) IV, polyethylene glycol, traZODone   PHYSICAL EXAM: Vital signs: Vitals:   10/21/15 0641 10/21/15 0807 10/21/15 1158 10/21/15 1345  BP: (!) 155/65    (!) 151/65  Pulse: 92   (!) 104  Resp: 16   18  Temp: 99.1 F (37.3 C)   98.7 F (37.1 C)  TempSrc: Oral     SpO2: 100% 99% 97% 97%  Weight:      Height:       Filed Weights   10/17/15 2115 10/19/15 0402 10/20/15 0539  Weight: 51.5 kg (113 lb 8.6 oz) 47.6 kg (104 lb 15 oz) 52.2 kg (115 lb 1.3 oz)   Body mass index is 19.15 kg/m.   General appearance :Awake, alert, oriented x3, sitting in chair Eyes:,PERRLA Neck: supple, no JVD. Resp: poor air movement and rare exp wheezes and few basilar crackles CVS: S1 S2 regular, no murmurs.  GI: Bowel sounds present, Non tender and not distended with no gaurding, rigidity or rebound.No organomegaly Extremities: B/L Lower Ext shows no edema, both legs are warm to touch Neurology:  speech clear,Non focal, sensation is grossly intact. Psychiatric: Normal judgment and insight. Alert and oriented x 3. Normal mood. Skin:No Rash, warm and dry  I have personally reviewed following labs and imaging studies  LABORATORY DATA: CBC:  Recent Labs Lab 10/17/15 1211 10/18/15 0423 10/21/15 0708  WBC 16.9* 8.5 14.7*  NEUTROABS 11.6*  --   --   HGB 9.7* 9.2* 9.6*  HCT 36.4 33.1* 35.0*  MCV 91.0 86.9 86.8  PLT 402* 365 357    Basic Metabolic Panel:  Recent Labs Lab 10/17/15 1211 10/18/15 0423 10/19/15 0301 10/21/15 0708  NA 143 144 139 138  K 4.4 4.4 4.6 5.0  CL 100* 98* 98* 96*  CO2 39* 35* 33* 35*  GLUCOSE 178* 148* 191* 162*  BUN 12 19 19  24*  CREATININE 0.59 0.76 0.71 0.69  CALCIUM 9.0 9.0 9.2 9.0    GFR: Estimated Creatinine Clearance: 53.2 mL/min (by C-G formula based on SCr of 0.69 mg/dL).  Liver Function Tests:  Recent Labs Lab 10/17/15 1211  AST 20  ALT 16  ALKPHOS 55  BILITOT 0.2*  PROT 6.4*  ALBUMIN 3.5   No results for input(s): LIPASE, AMYLASE in the last 168 hours. No results for input(s): AMMONIA in the last 168 hours.  Coagulation Profile: No results for input(s): INR, PROTIME in the last 168  hours.  Cardiac Enzymes: No results for input(s): CKTOTAL, CKMB, CKMBINDEX, TROPONINI in the last 168 hours.  BNP (last 3 results) No results for input(s): PROBNP in the last 8760 hours.  HbA1C: No results for input(s): HGBA1C in the last 72 hours.  CBG:  Recent Labs Lab 10/20/15 1230 10/20/15 1706 10/20/15 2302 10/21/15 0814 10/21/15 1200  GLUCAP 213* 170* 136* 119* 138*    Lipid Profile: No results for input(s): CHOL, HDL, LDLCALC, TRIG, CHOLHDL, LDLDIRECT in the last 72 hours.  Thyroid Function Tests: No results for input(s): TSH, T4TOTAL, FREET4, T3FREE, THYROIDAB in the last 72 hours.  Anemia  Panel: No results for input(s): VITAMINB12, FOLATE, FERRITIN, TIBC, IRON, RETICCTPCT in the last 72 hours.  Urine analysis:    Component Value Date/Time   COLORURINE YELLOW 03/15/2015 2336   APPEARANCEUR CLEAR 03/15/2015 2336   LABSPEC 1.020 03/15/2015 2336   PHURINE 5.5 03/15/2015 2336   GLUCOSEU NEGATIVE 03/15/2015 2336   HGBUR NEGATIVE 03/15/2015 2336   BILIRUBINUR NEGATIVE 03/15/2015 2336   KETONESUR NEGATIVE 03/15/2015 2336   PROTEINUR 30 (A) 03/15/2015 2336   NITRITE NEGATIVE 03/15/2015 2336   LEUKOCYTESUR NEGATIVE 03/15/2015 2336    Sepsis Labs: Lactic Acid, Venous    Component Value Date/Time   LATICACIDVEN 1.04 03/15/2015 1504    MICROBIOLOGY: Recent Results (from the past 240 hour(s))  MRSA PCR Screening     Status: None   Collection Time: 10/17/15  9:24 PM  Result Value Ref Range Status   MRSA by PCR NEGATIVE NEGATIVE Final    Comment:        The GeneXpert MRSA Assay (FDA approved for NASAL specimens only), is one component of a comprehensive MRSA colonization surveillance program. It is not intended to diagnose MRSA infection nor to guide or monitor treatment for MRSA infections.     RADIOLOGY STUDIES/RESULTS: Dg Chest Portable 1 View  Result Date: 10/17/2015 CLINICAL DATA:  Weakness and dyspnea EXAM: PORTABLE CHEST 1 VIEW COMPARISON:   09/10/2015 FINDINGS: Cardiac shadow is stable. The lungs are well aerated bilaterally. No focal infiltrate or sizable effusion is seen. No bony abnormality is noted. IMPRESSION: No acute abnormality seen. Electronically Signed   By: Alcide Clever M.D.   On: 10/17/2015 12:30     LOS: 4 days   Zannie Cove, MD  [facility] Pager:[phone]  If 7PM-7AM, please contact night-coverage [email or web address] Password [email or web address] 10/21/2015, 1:51 PM

## 2015-10-22 DIAGNOSIS — I10 Essential (primary) hypertension: Secondary | ICD-10-CM

## 2015-10-22 LAB — CBC
HCT: 34 % — ABNORMAL LOW (ref 36.0–46.0)
Hemoglobin: 9.8 g/dL — ABNORMAL LOW (ref 12.0–15.0)
MCH: 24.1 pg — AB (ref 26.0–34.0)
MCHC: 28.8 g/dL — ABNORMAL LOW (ref 30.0–36.0)
MCV: 83.7 fL (ref 78.0–100.0)
PLATELETS: 269 10*3/uL (ref 150–400)
RBC: 4.06 MIL/uL (ref 3.87–5.11)
RDW: 16.4 % — AB (ref 11.5–15.5)
WBC: 14 10*3/uL — AB (ref 4.0–10.5)

## 2015-10-22 LAB — GLUCOSE, CAPILLARY
GLUCOSE-CAPILLARY: 150 mg/dL — AB (ref 65–99)
GLUCOSE-CAPILLARY: 185 mg/dL — AB (ref 65–99)
Glucose-Capillary: 137 mg/dL — ABNORMAL HIGH (ref 65–99)
Glucose-Capillary: 193 mg/dL — ABNORMAL HIGH (ref 65–99)

## 2015-10-22 LAB — BASIC METABOLIC PANEL
ANION GAP: 10 (ref 5–15)
BUN: 22 mg/dL — AB (ref 6–20)
CO2: 33 mmol/L — AB (ref 22–32)
Calcium: 9.1 mg/dL (ref 8.9–10.3)
Chloride: 96 mmol/L — ABNORMAL LOW (ref 101–111)
Creatinine, Ser: 0.76 mg/dL (ref 0.44–1.00)
GFR calc Af Amer: >60 mL/min (ref >60)
GFR calc non Af Amer: >60 mL/min (ref >60)
GLUCOSE: 129 mg/dL — AB (ref 65–99)
POTASSIUM: 4.7 mmol/L (ref 3.5–5.1)
SODIUM: 139 mmol/L (ref 135–145)

## 2015-10-22 MED ORDER — IPRATROPIUM-ALBUTEROL 0.5-2.5 (3) MG/3ML IN SOLN
3.0000 mL | Freq: Four times a day (QID) | RESPIRATORY_TRACT | Status: DC
  Administered 2015-10-23 – 2015-10-24 (×6): 3 mL via RESPIRATORY_TRACT
  Filled 2015-10-22 (×6): qty 3

## 2015-10-22 MED ORDER — PREDNISONE 50 MG PO TABS
60.0000 mg | ORAL_TABLET | Freq: Every day | ORAL | Status: DC
  Administered 2015-10-22 – 2015-10-24 (×3): 60 mg via ORAL
  Filled 2015-10-22 (×3): qty 1

## 2015-10-22 NOTE — Progress Notes (Signed)
PROGRESS NOTE        PATIENT DETAILS Name: Nicole Maxwell Age: 70 y.o. Sex: female Date of Birth: 05/17/1945 Admit Date: 10/17/2015 Admitting Physician Starleen Arms, MD ZOX:WRUE, [name], MD  Brief Narrative: Patient is a 70 y.o. female with past medical history of Severe GOLD STAGE 4 COPD/Chronic resp failure on home O2, chronic diastolic heart failure, ongoing tobacco abuse, OSA on CPAP (noncompliant for the past 1 month) admitted on 9/22 for acute on chronic hypercarbic respiratory failure secondary to COPD exacerbation. Improving with Nebs, Abx, Steroids-very slow to improve and significant symptom burden  Subjective: She reports ongoing shortness of breath, and she is doing little better today, getting closer baseline  Assessment/Plan: Active Problems: Acute on chronic hypoxic/hypercarbic respiratory failure: - Required BiPAP on admission, now off - due to COPD flare and untreated OSA - has been on IV  Rocephin/zithromax,  change to PO levaquin today - CXR was clear - cut down IV solumedrol slowly, nebs, Weaned O2 to 2.5L -CM consulted to request home CPAP eval, daughter brought home CPAP and I have requested Staff to use this inpatient -On 10/22/2015, stopped IV SoluMedrol, transitioned to oral prednisone 60 mg daily  COPD exacerbation/Severe COPD Gold stage 4 -as above  Tobacco abuse -counseled, she has cut down but still smokes despite using O2, educated pt on the dangers with this  Chronic diastolic heart failure:  -continue Lasix PO-increase to 40 mg 10/21/2015  Type 2 diabetes: -hold metformin, CBGs stable on SSI.  Hypertension: -BP better continue losartan and Lasix, added amlodipine  Dyslipidemia: - Continue statin  Anxiety: - Resume as needed Xanax-but at a decreased dose  Chronic pain:Pt reported to previous MD that she occasionally takes narcotics at home-but not on a regular basis. Per daughter not on narcotics for  >1year  GERD/esophageal dysmotility: Continue PPI  Tobacco abuse: Counseled  DVT Prophylaxis:Lovenox  Code Status:DNI Family Communication:None at bedside, called and d/w daughter Byrd Hesselbach 9/25 Disposition Plan:Home in 1-2days  Antimicrobial agents: Levaquin  Procedures: None  CONSULTS:  None  Time spent:  MEDICATIONS: Anti-infectives    Start     Dose/Rate Route Frequency Ordered Stop   10/21/15 1430  levofloxacin (LEVAQUIN) tablet 500 mg     500 mg Oral Daily 10/21/15 1425     10/17/15 1700  cefTRIAXone (ROCEPHIN) 1 g in dextrose 5 % 50 mL IVPB  Status:  Discontinued     1 g 100 mL/hr over 30 Minutes Intravenous Every 24 hours 10/17/15 1654 10/21/15 1354   10/17/15 1630  azithromycin (ZITHROMAX) 500 mg in dextrose 5 % 250 mL IVPB  Status:  Discontinued     500 mg 250 mL/hr over 60 Minutes Intravenous Every 24 hours 10/17/15 1629 10/21/15 1354   10/17/15 1300  azithromycin (ZITHROMAX) 500 mg in dextrose 5 % 250 mL IVPB     500 mg 250 mL/hr over 60 Minutes Intravenous  Once 10/17/15 1249 10/17/15 1403      Scheduled Meds: . amLODipine  10 mg Oral Daily  . aspirin EC  81 mg Oral Daily  . atorvastatin  20 mg Oral Daily  . DULoxetine  60 mg Oral Daily  . enoxaparin (LOVENOX) injection  40 mg Subcutaneous Q24H  . feeding supplement (ENSURE ENLIVE)  237 mL Oral BID BM  . fluticasone  1 spray Each Nare BID  .  furosemide  40 mg Oral Daily  . guaiFENesin  600 mg Oral BID  . insulin aspart  0-9 Units Subcutaneous TID WC  . ipratropium-albuterol  3 mL Nebulization Q4H  . latanoprost  1 drop Both Eyes QHS  . levofloxacin  500 mg Oral Daily  . losartan  25 mg Oral Daily  . pantoprazole  40 mg Oral q morning - 10a  . predniSONE  60 mg Oral Q breakfast   Continuous Infusions:  PRN Meds:.acetaminophen **OR** acetaminophen, albuterol, alprazolam, bisacodyl, ondansetron **OR** ondansetron (ZOFRAN) IV, polyethylene glycol, traZODone   PHYSICAL EXAM: Vital  signs: Vitals:   10/22/15 0505 10/22/15 0802 10/22/15 0942 10/22/15 1148  BP: (!) 149/78     Pulse: 88     Resp: 18     Temp: 98 F (36.7 C)     TempSrc: Oral     SpO2: 100%  100% 99%  Weight:  48.6 kg (107 lb 1.6 oz)    Height:       Filed Weights   10/19/15 0402 10/20/15 0539 10/22/15 0802  Weight: 47.6 kg (104 lb 15 oz) 52.2 kg (115 lb 1.3 oz) 48.6 kg (107 lb 1.6 oz)   Body mass index is 17.82 kg/m.   General appearance :Awake, alert, oriented x3, sitting in chair Eyes:,PERRLA Neck: supple, no JVD. Resp: poor air movement and rare exp wheezes and few basilar crackles CVS: S1 S2 regular, no murmurs.  GI: Bowel sounds present, Non tender and not distended with no gaurding, rigidity or rebound.No organomegaly Extremities: B/L Lower Ext shows no edema, both legs are warm to touch Neurology:  speech clear,Non focal, sensation is grossly intact. Psychiatric: Normal judgment and insight. Alert and oriented x 3. Normal mood. Skin:No Rash, warm and dry  I have personally reviewed following labs and imaging studies  LABORATORY DATA: CBC:  Recent Labs Lab 10/17/15 1211 10/18/15 0423 10/21/15 0708 10/22/15 0541  WBC 16.9* 8.5 14.7* 14.0*  NEUTROABS 11.6*  --   --   --   HGB 9.7* 9.2* 9.6* 9.8*  HCT 36.4 33.1* 35.0* 34.0*  MCV 91.0 86.9 86.8 83.7  PLT 402* 365 357 269    Basic Metabolic Panel:  Recent Labs Lab 10/17/15 1211 10/18/15 0423 10/19/15 0301 10/21/15 0708 10/22/15 0541  NA 143 144 139 138 139  K 4.4 4.4 4.6 5.0 4.7  CL 100* 98* 98* 96* 96*  CO2 39* 35* 33* 35* 33*  GLUCOSE 178* 148* 191* 162* 129*  BUN 12 19 19  24* 22*  CREATININE 0.59 0.76 0.71 0.69 0.76  CALCIUM 9.0 9.0 9.2 9.0 9.1    GFR: Estimated Creatinine Clearance: 49.5 mL/min (by C-G formula based on SCr of 0.76 mg/dL).  Liver Function Tests:  Recent Labs Lab 10/17/15 1211  AST 20  ALT 16  ALKPHOS 55  BILITOT 0.2*  PROT 6.4*  ALBUMIN 3.5   No results for input(s): LIPASE,  AMYLASE in the last 168 hours. No results for input(s): AMMONIA in the last 168 hours.  Coagulation Profile: No results for input(s): INR, PROTIME in the last 168 hours.  Cardiac Enzymes: No results for input(s): CKTOTAL, CKMB, CKMBINDEX, TROPONINI in the last 168 hours.  BNP (last 3 results) No results for input(s): PROBNP in the last 8760 hours.  HbA1C: No results for input(s): HGBA1C in the last 72 hours.  CBG:  Recent Labs Lab 10/21/15 1200 10/21/15 1656 10/21/15 2225 10/22/15 0751 10/22/15 1156  GLUCAP 138* 175* 148* 137* 193*    Lipid  Profile: No results for input(s): CHOL, HDL, LDLCALC, TRIG, CHOLHDL, LDLDIRECT in the last 72 hours.  Thyroid Function Tests: No results for input(s): TSH, T4TOTAL, FREET4, T3FREE, THYROIDAB in the last 72 hours.  Anemia Panel: No results for input(s): VITAMINB12, FOLATE, FERRITIN, TIBC, IRON, RETICCTPCT in the last 72 hours.  Urine analysis:    Component Value Date/Time   COLORURINE YELLOW 03/15/2015 2336   APPEARANCEUR CLEAR 03/15/2015 2336   LABSPEC 1.020 03/15/2015 2336   PHURINE 5.5 03/15/2015 2336   GLUCOSEU NEGATIVE 03/15/2015 2336   HGBUR NEGATIVE 03/15/2015 2336   BILIRUBINUR NEGATIVE 03/15/2015 2336   KETONESUR NEGATIVE 03/15/2015 2336   PROTEINUR 30 (A) 03/15/2015 2336   NITRITE NEGATIVE 03/15/2015 2336   LEUKOCYTESUR NEGATIVE 03/15/2015 2336    Sepsis Labs: Lactic Acid, Venous    Component Value Date/Time   LATICACIDVEN 1.04 03/15/2015 1504    MICROBIOLOGY: Recent Results (from the past 240 hour(s))  MRSA PCR Screening     Status: None   Collection Time: 10/17/15  9:24 PM  Result Value Ref Range Status   MRSA by PCR NEGATIVE NEGATIVE Final    Comment:        The GeneXpert MRSA Assay (FDA approved for NASAL specimens only), is one component of a comprehensive MRSA colonization surveillance program. It is not intended to diagnose MRSA infection nor to guide or monitor treatment for MRSA  infections.     RADIOLOGY STUDIES/RESULTS: Dg Chest Portable 1 View  Result Date: 10/17/2015 CLINICAL DATA:  Weakness and dyspnea EXAM: PORTABLE CHEST 1 VIEW COMPARISON:  09/10/2015 FINDINGS: Cardiac shadow is stable. The lungs are well aerated bilaterally. No focal infiltrate or sizable effusion is seen. No bony abnormality is noted. IMPRESSION: No acute abnormality seen. Electronically Signed   By: Alcide Clever M.D.   On: 10/17/2015 12:30     LOS: 5 days   Jeralyn Bennett, MD  [facility] Pager:[phone]  If 7PM-7AM, please contact night-coverage [email or web address] Password [email or web address] 10/22/2015, 2:15 PM

## 2015-10-22 NOTE — Progress Notes (Signed)
Physical Therapy Treatment Patient Details Name: Nicole Maxwell MRN: [id number] DOB: 09-13-1945 Today's Date: 10/22/2015    History of Present Illness Pt adm with resp failure and COPD exacerbation. Initially placed on bipap. PMH - copd, DM, HTN, OA, chronic back pain    PT Comments    Patient demonstrated increased activity tolerance this session. SpO2 remained >90% on 2L O2 throughout session. Current plan remains appropriate.   Follow Up Recommendations  No PT follow up     Equipment Recommendations  None recommended by PT    Recommendations for Other Services       Precautions / Restrictions Precautions Precautions: Fall Restrictions Weight Bearing Restrictions: No    Mobility  Bed Mobility               General bed mobility comments: not assessed; pt OOB in chair upon arrival and back to chair  Transfers Overall transfer level: Modified independent                  Ambulation/Gait Ambulation/Gait assistance: Supervision Ambulation Distance (Feet): 300 Feet Assistive device: Rolling walker (2 wheeled) Gait Pattern/deviations: Step-through pattern;Decreased stride length     General Gait Details: slow, steady gait; 1 standing rest break; SpO2 remained > 90% on 2L O2   Stairs            Wheelchair Mobility    Modified Rankin (Stroke Patients Only)       Balance     Sitting balance-Leahy Scale: Normal       Standing balance-Leahy Scale: Fair                      Cognition Arousal/Alertness: Awake/alert Behavior During Therapy: WFL for tasks assessed/performed Overall Cognitive Status: Within Functional Limits for tasks assessed                      Exercises      General Comments        Pertinent Vitals/Pain Pain Assessment: No/denies pain    Home Living                      Prior Function            PT Goals (current goals can now be found in the care plan section) Acute Rehab PT  Goals Patient Stated Goal: return home PT Goal Formulation: With patient Time For Goal Achievement: 10/25/15 Potential to Achieve Goals: Good Progress towards PT goals: Progressing toward goals    Frequency    Min 3X/week      PT Plan Current plan remains appropriate    Co-evaluation             End of Session Equipment Utilized During Treatment: Oxygen;Gait belt Activity Tolerance: Patient tolerated treatment well Patient left: in chair;with call bell/phone within reach;with chair alarm set     Time: 1208-1238 PT Time Calculation (min) (ACUTE ONLY): 30 min  Charges:  $Gait Training: 8-22 mins $Therapeutic Activity: 8-22 mins                    G Codes:      Derek MoundKellyn R Vonceil Upshur Jaloni Davoli, PTA Pager: [phone]   10/22/2015, 1:27 PM

## 2015-10-23 LAB — BASIC METABOLIC PANEL
ANION GAP: 4 — AB (ref 5–15)
BUN: 27 mg/dL — ABNORMAL HIGH (ref 6–20)
CHLORIDE: 96 mmol/L — AB (ref 101–111)
CO2: 36 mmol/L — AB (ref 22–32)
Calcium: 8.8 mg/dL — ABNORMAL LOW (ref 8.9–10.3)
Creatinine, Ser: 0.82 mg/dL (ref 0.44–1.00)
GFR calc non Af Amer: >60 mL/min (ref >60)
Glucose, Bld: 89 mg/dL (ref 65–99)
Potassium: 4.5 mmol/L (ref 3.5–5.1)
Sodium: 136 mmol/L (ref 135–145)

## 2015-10-23 LAB — GLUCOSE, CAPILLARY
GLUCOSE-CAPILLARY: 113 mg/dL — AB (ref 65–99)
GLUCOSE-CAPILLARY: 192 mg/dL — AB (ref 65–99)
GLUCOSE-CAPILLARY: 197 mg/dL — AB (ref 65–99)
Glucose-Capillary: 80 mg/dL (ref 65–99)

## 2015-10-23 LAB — CBC
HEMATOCRIT: 32.8 % — AB (ref 36.0–46.0)
HEMOGLOBIN: 9.4 g/dL — AB (ref 12.0–15.0)
MCH: 23.8 pg — ABNORMAL LOW (ref 26.0–34.0)
MCHC: 28.7 g/dL — ABNORMAL LOW (ref 30.0–36.0)
MCV: 83 fL (ref 78.0–100.0)
Platelets: 306 10*3/uL (ref 150–400)
RBC: 3.95 MIL/uL (ref 3.87–5.11)
RDW: 16.2 % — ABNORMAL HIGH (ref 11.5–15.5)
WBC: 15.3 10*3/uL — AB (ref 4.0–10.5)

## 2015-10-23 NOTE — Progress Notes (Signed)
[facility]

## 2015-10-23 NOTE — Progress Notes (Signed)
Nutrition Follow-up  DOCUMENTATION CODES:   Not applicable  INTERVENTION:   -D/c Ensure Enlive po BID, each supplement provides 350 kcal and 20 grams of protein -Encourage adequate PO intake  NUTRITION DIAGNOSIS:   Increased nutrient needs related to catabolic illness (End stage copd) as evidenced by severe depletion of muscle mass.  Ongoing  GOAL:   Patient will meet greater than or equal to 90% of their needs  Progressing  MONITOR:   PO intake, Supplement acceptance, Labs  REASON FOR ASSESSMENT:   Consult COPD Protocol  ASSESSMENT:   Patient is a 70 y.o. female with past medical history of Severe GOLD STAGE 4 COPD/Chronic resp failure on home O2, chronic diastolic heart failure, ongoing tobacco abuse, OSA on CPAP (noncompliant for the past 1 month) admitted on 9/22 for acute on chronic hypercarbic respiratory failure secondary to COPD exacerbation.  Spoke with pt daughter outside of pt room; she requested this RD not visit pt while consuming lunch ("she won't eat if she is distracted").   Pt daughter reports pt is feeling better and appetite has returned to baseline. Meal completion 25-100%, averaging around 75% meal completion. Pt sitting in recliner consuming lunch without difficulty. Pt does not like supplements and has been refusing Ensure Enlive- RD will d/c.   Pt daughter reports possible discharge back home today or tomorrow. Pt daughter denies any further nutritional concerns or questions ("I've done all of this before"), however, expressed appreciation for visit.   Labs reviewed: CBGS: 80-185.  Diet Order:  Diet heart healthy/carb modified Room service appropriate? Yes; Fluid consistency: Thin  Skin:  Reviewed, no issues  Last BM:  10/20/15  Height:   Ht Readings from Last 1 Encounters:  10/17/15 5\' 5"  (1.651 m)    Weight:   Wt Readings from Last 1 Encounters:  10/22/15 107 lb 1.6 oz (48.6 kg)    Ideal Body Weight:  56.82 kg  BMI:  Body mass  index is 17.82 kg/m.  Estimated Nutritional Needs:   Kcal:  1700-1900 kcals (33-37 kcal/kg bw)  Protein:  70-80 g Pro (1.4-1.6 g/kg bw)  Fluid:  1.6 liters (30 ml/kg bw)  EDUCATION NEEDS:   No education needs identified at this time  Tyreece Gelles A. Mayford KnifeWilliams, RD, LDN, CDE Pager: [phone] After hours Pager: [phone]

## 2015-10-23 NOTE — Care Management Important Message (Signed)
Important Message  Patient Details  Name: Nicole Maxwell MRN: [id number] Date of Birth: [birth year]   Medicare Important Message Given:  Yes    Dorena BodoIris Tulsi Crossett 10/23/2015, 3:24 PM

## 2015-10-23 NOTE — Progress Notes (Signed)
Occupational Therapy Treatment Patient Details Name: Nicole Maxwell MRN: [id number] DOB: 03-22-1945 Today's Date: 10/23/2015    History of present illness Pt adm with resp failure and COPD exacerbation. Initially placed on bipap. PMH - copd, DM, HTN, OA, chronic back pain   OT comments  Pt making good progress. Pt states that she does not need a shower seat or energy conservation education handout. Pt planing to d/c home tomorrow. Pt's daughter and sitter present. Pt more talkative, no SOB during session and O2 >90%  Follow Up Recommendations  No OT follow up;Supervision - Intermittent    Equipment Recommendations  Tub/shower seat;Other (comment) Lexicographer)    Recommendations for Other Services      Precautions / Restrictions Precautions Precautions: Fall Restrictions Weight Bearing Restrictions: No       Mobility Bed Mobility               General bed mobility comments: up in recliner  Transfers Overall transfer level: Modified independent Equipment used: None Transfers: Sit to/from Stand Sit to Stand: Modified independent (Device/Increase time);Supervision         General transfer comment: ambulated to bathroom for toileitng, shower transfer and grooming at sink    Balance     Sitting balance-Leahy Scale: Good       Standing balance-Leahy Scale: Fair                     ADL       Grooming: Therapist, nutritional;Wash/dry hands;Min guard;Supervision/safety   Upper Body Bathing: Set up;Sitting   Lower Body Bathing: Min guard;Sit to/from stand   Upper Body Dressing : Set up   Lower Body Dressing: Independent   Toilet Transfer: Supervision/safety;Ambulation;Comfort height toilet;Grab bars;Min guard   Toileting- Clothing Manipulation and Hygiene: Supervision/safety   Tub/ Shower Transfer: Min guard;Supervision/safety;Grab bars     General ADL Comments: pt declines any DME and energy conservation handout      Cognition   Behavior During  Therapy: WFL for tasks assessed/performed;Impulsive Overall Cognitive Status: Within Functional Limits for tasks assessed Area of Impairment: Safety/judgement                     Extremity/Trunk Assessment   WFL                        General Comments  pt cooperative    Pertinent Vitals/ Pain       Pain Assessment: No/denies pain                                                          Frequency  Min 2X/week        Progress Toward Goals  OT Goals(current goals can now be found in the care plan section)  Progress towards OT goals: Progressing toward goals     Plan Discharge plan remains appropriate                     End of Session Equipment Utilized During Treatment: Oxygen   Activity Tolerance Patient tolerated treatment well   Patient Left in bed;with call bell/phone within reach;with family/visitor present;Other (comment) (sitting EOB, RT in for breathing tx)             Time: 4098-1191 OT  Time Calculation (min): 16 min  Charges: OT General Charges $OT Visit: 1 Procedure OT Treatments $Self Care/Home Management : 8-22 mins  Galen ManilaSpencer, Jeanni Allshouse Jeanette 10/23/2015, 1:36 PM

## 2015-10-23 NOTE — Progress Notes (Signed)
Pt in room, sitter at bedside. No current c/o pain. Will continue to monitor.

## 2015-10-23 NOTE — Progress Notes (Signed)
[facility]ermaine. He stated he will have [facility] contact Dr Gregary Cromeramaswany's office for updated CPAP order, and [facility] will be in contact with patient. No further CM intervention required. Patient may take home CPAP she brought with her.

## 2015-10-23 NOTE — Progress Notes (Signed)
PROGRESS NOTE        PATIENT DETAILS Name: Nicole Maxwell Age: 70 y.o. Sex: female Date of Birth: 10-Mar-1945 Admit Date: 10/17/2015 Admitting Physician Starleen Armsawood S Elgergawy, MD ZOX:[id number]:Shaw, Chrissie NoaWilliam, MD  Brief Narrative: Patient is a 70 y.o. female with past medical history of Severe GOLD STAGE 4 COPD/Chronic resp failure on home O2, chronic diastolic heart failure, ongoing tobacco abuse, OSA on CPAP (noncompliant for the past 1 month) admitted on 9/22 for acute on chronic hypercarbic respiratory failure secondary to COPD exacerbation. Improving with Nebs, Abx, Steroids-very slow to improve and significant symptom burden  Subjective: She reports ongoing shortness of breath, and she is doing little better today, getting closer baseline  Assessment/Plan: Active Problems: Acute on chronic hypoxic/hypercarbic respiratory failure: - Required BiPAP on admission, now off - due to COPD flare and untreated OSA - has been on IV  Rocephin/zithromax,  change to PO levaquin on 10/22/2015 - CXR was clear - cut down IV solumedrol slowly, nebs, Weaned O2 to 2.5L -CM consulted to request home CPAP eval, daughter brought home CPAP and I have requested Staff to use this inpatient -On 10/22/2015, stopped IV SoluMedrol, transitioned to oral prednisone 60 mg daily -Showing slow improvement, not at her baseline, will monitor overnight  COPD exacerbation/Severe COPD Gold stage 4 -Showing slow improvement as she does not feel she is at her baseline -Continue prednisone nebulizer treatments and antibiotic therapy  Tobacco abuse -counseled, she has cut down but still smokes despite using O2, educated pt on the dangers with this  Chronic diastolic heart failure:  -continue Lasix PO-increase to 40 mg 10/21/2015  Type 2 diabetes: -hold metformin, CBGs stable on SSI.  Hypertension: -Last blood pressure 122/45, continue losartan and Lasix, added amlodipine  Dyslipidemia: - Continue  statin  Anxiety: - Resume as needed Xanax-but at a decreased dose  Chronic pain:Pt reported to previous MD that she occasionally takes narcotics at home-but not on a regular basis. Per daughter not on narcotics for >1year  GERD/esophageal dysmotility: Continue PPI  Tobacco abuse: Counseled  DVT Prophylaxis:Lovenox  Code Status:DNI Family Communication:None at bedside, called and d/w daughter [name] 9/25 Disposition Plan: Anticipate discharge the next 24 hours  Antimicrobial agents: Levaquin  Procedures: None  CONSULTS:  None  Time spent: 25min  MEDICATIONS: Anti-infectives    Start     Dose/Rate Route Frequency Ordered Stop   10/21/15 1430  levofloxacin (LEVAQUIN) tablet 500 mg     500 mg Oral Daily 10/21/15 1425     10/17/15 1700  cefTRIAXone (ROCEPHIN) 1 g in dextrose 5 % 50 mL IVPB  Status:  Discontinued     1 g 100 mL/hr over 30 Minutes Intravenous Every 24 hours 10/17/15 1654 10/21/15 1354   10/17/15 1630  azithromycin (ZITHROMAX) 500 mg in dextrose 5 % 250 mL IVPB  Status:  Discontinued     500 mg 250 mL/hr over 60 Minutes Intravenous Every 24 hours 10/17/15 1629 10/21/15 1354   10/17/15 1300  azithromycin (ZITHROMAX) 500 mg in dextrose 5 % 250 mL IVPB     500 mg 250 mL/hr over 60 Minutes Intravenous  Once 10/17/15 1249 10/17/15 1403      Scheduled Meds: . amLODipine  10 mg Oral Daily  . aspirin EC  81 mg Oral Daily  . atorvastatin  20 mg Oral Daily  . DULoxetine  60 mg  Oral Daily  . enoxaparin (LOVENOX) injection  40 mg Subcutaneous Q24H  . fluticasone  1 spray Each Nare BID  . furosemide  40 mg Oral Daily  . guaiFENesin  600 mg Oral BID  . insulin aspart  0-9 Units Subcutaneous TID WC  . ipratropium-albuterol  3 mL Nebulization QID  . latanoprost  1 drop Both Eyes QHS  . levofloxacin  500 mg Oral Daily  . losartan  25 mg Oral Daily  . pantoprazole  40 mg Oral q morning - 10a  . predniSONE  60 mg Oral Q breakfast   Continuous Infusions:  PRN  Meds:.acetaminophen **OR** acetaminophen, albuterol, alprazolam, bisacodyl, ondansetron **OR** ondansetron (ZOFRAN) IV, polyethylene glycol   PHYSICAL EXAM: Vital signs: Vitals:   10/23/15 1125 10/23/15 1221 10/23/15 1353 10/23/15 1635  BP: (!) 119/51  (!) 122/45   Pulse:  83 99 92  Resp:  18 15 18   Temp:   98.2 F (36.8 C)   TempSrc:   Oral   SpO2:  100% 100% 100%  Weight:      Height:       Filed Weights   10/19/15 0402 10/20/15 0539 10/22/15 0802  Weight: 47.6 kg (104 lb 15 oz) 52.2 kg (115 lb 1.3 oz) 48.6 kg (107 lb 1.6 oz)   Body mass index is 17.82 kg/m.   General appearance :Chronically ill-appearing, although awake and alert Eyes:,PERRLA Neck: supple, no JVD. Resp: She continues to have poor air movement, bilateral expiratory wheezes, coarse respiratory sounds CVS: S1 S2 regular, no murmurs.  GI: Bowel sounds present, Non tender and not distended with no gaurding, rigidity or rebound.No organomegaly Extremities: B/L Lower Ext shows no edema, both legs are warm to touch Neurology:  speech clear,Non focal, sensation is grossly intact. Psychiatric: Normal judgment and insight. Alert and oriented x 3. Normal mood. Skin:No Rash, warm and dry  I have personally reviewed following labs and imaging studies  LABORATORY DATA: CBC:  Recent Labs Lab 10/17/15 1211 10/18/15 0423 10/21/15 0708 10/22/15 0541 10/23/15 0536  WBC 16.9* 8.5 14.7* 14.0* 15.3*  NEUTROABS 11.6*  --   --   --   --   HGB 9.7* 9.2* 9.6* 9.8* 9.4*  HCT 36.4 33.1* 35.0* 34.0* 32.8*  MCV 91.0 86.9 86.8 83.7 83.0  PLT 402* 365 357 269 306    Basic Metabolic Panel:  Recent Labs Lab 10/18/15 0423 10/19/15 0301 10/21/15 0708 10/22/15 0541 10/23/15 0536  NA 144 139 138 139 136  K 4.4 4.6 5.0 4.7 4.5  CL 98* 98* 96* 96* 96*  CO2 35* 33* 35* 33* 36*  GLUCOSE 148* 191* 162* 129* 89  BUN 19 19 24* 22* 27*  CREATININE 0.76 0.71 0.69 0.76 0.82  CALCIUM 9.0 9.2 9.0 9.1 8.8*    GFR: Estimated  Creatinine Clearance: 48.3 mL/min (by C-G formula based on SCr of 0.82 mg/dL).  Liver Function Tests:  Recent Labs Lab 10/17/15 1211  AST 20  ALT 16  ALKPHOS 55  BILITOT 0.2*  PROT 6.4*  ALBUMIN 3.5   No results for input(s): LIPASE, AMYLASE in the last 168 hours. No results for input(s): AMMONIA in the last 168 hours.  Coagulation Profile: No results for input(s): INR, PROTIME in the last 168 hours.  Cardiac Enzymes: No results for input(s): CKTOTAL, CKMB, CKMBINDEX, TROPONINI in the last 168 hours.  BNP (last 3 results) No results for input(s): PROBNP in the last 8760 hours.  HbA1C: No results for input(s): HGBA1C in the last  72 hours.  CBG:  Recent Labs Lab 10/22/15 1156 10/22/15 1738 10/22/15 2124 10/23/15 0806 10/23/15 1158  GLUCAP 193* 150* 185* 80 113*    Lipid Profile: No results for input(s): CHOL, HDL, LDLCALC, TRIG, CHOLHDL, LDLDIRECT in the last 72 hours.  Thyroid Function Tests: No results for input(s): TSH, T4TOTAL, FREET4, T3FREE, THYROIDAB in the last 72 hours.  Anemia Panel: No results for input(s): VITAMINB12, FOLATE, FERRITIN, TIBC, IRON, RETICCTPCT in the last 72 hours.  Urine analysis:    Component Value Date/Time   COLORURINE YELLOW 03/15/2015 2336   APPEARANCEUR CLEAR 03/15/2015 2336   LABSPEC 1.020 03/15/2015 2336   PHURINE 5.5 03/15/2015 2336   GLUCOSEU NEGATIVE 03/15/2015 2336   HGBUR NEGATIVE 03/15/2015 2336   BILIRUBINUR NEGATIVE 03/15/2015 2336   KETONESUR NEGATIVE 03/15/2015 2336   PROTEINUR 30 (A) 03/15/2015 2336   NITRITE NEGATIVE 03/15/2015 2336   LEUKOCYTESUR NEGATIVE 03/15/2015 2336    Sepsis Labs: Lactic Acid, Venous    Component Value Date/Time   LATICACIDVEN 1.04 03/15/2015 1504    MICROBIOLOGY: Recent Results (from the past 240 hour(s))  MRSA PCR Screening     Status: None   Collection Time: 10/17/15  9:24 PM  Result Value Ref Range Status   MRSA by PCR NEGATIVE NEGATIVE Final    Comment:        The  GeneXpert MRSA Assay (FDA approved for NASAL specimens only), is one component of a comprehensive MRSA colonization surveillance program. It is not intended to diagnose MRSA infection nor to guide or monitor treatment for MRSA infections.     RADIOLOGY STUDIES/RESULTS: Dg Chest Portable 1 View  Result Date: 10/17/2015 CLINICAL DATA:  Weakness and dyspnea EXAM: PORTABLE CHEST 1 VIEW COMPARISON:  09/10/2015 FINDINGS: Cardiac shadow is stable. The lungs are well aerated bilaterally. No focal infiltrate or sizable effusion is seen. No bony abnormality is noted. IMPRESSION: No acute abnormality seen. Electronically Signed   By: Alcide Clever M.D.   On: 10/17/2015 12:30     LOS: 6 days   Jeralyn Bennett, MD  [facility] Pager:[phone]  If 7PM-7AM, please contact night-coverage [email or web address] Password [facility] 10/23/2015, 4:37 PM

## 2015-10-24 LAB — GLUCOSE, CAPILLARY
GLUCOSE-CAPILLARY: 108 mg/dL — AB (ref 65–99)
GLUCOSE-CAPILLARY: 134 mg/dL — AB (ref 65–99)

## 2015-10-24 MED ORDER — POLYETHYLENE GLYCOL 3350 17 G PO PACK: 17.0000 g | PACK | Freq: Every day | ORAL | 0 refills | Status: DC | PRN

## 2015-10-24 MED ORDER — LEVOFLOXACIN 500 MG PO TABS: 500.0000 mg | ORAL_TABLET | Freq: Every day | ORAL | 0 refills | Status: DC

## 2015-10-24 MED ORDER — PREDNISONE 10 MG (21) PO TBPK: ORAL_TABLET | ORAL | 0 refills | Status: DC

## 2015-10-24 MED ORDER — AMLODIPINE BESYLATE 10 MG PO TABS: 10.0000 mg | ORAL_TABLET | Freq: Every day | ORAL | 1 refills | Status: DC

## 2015-10-24 NOTE — Discharge Summary (Signed)
.  Physician Discharge Summary  [name]:[id number] DOB: 1945/12/15 DOA: 10/17/2015  PCP: [name], MD  Admit date: 10/17/2015 Discharge date: 10/24/2015  Time spent: 35 minutes  Recommendations for Outpatient Follow-up:  1. Please follow up on respiratory status, she was admitted for COPD exacerbation, discharged on Prednisone taper 2. Home Health services set up prior to discharge for RN and PT   Discharge Diagnoses:  Active Problems:   COPD exacerbation (HCC)   Chronic diastolic congestive heart failure (HCC)   DM type 2 (diabetes mellitus, type 2) (HCC)   Essential hypertension   Tobacco use   Acute respiratory failure with hypercapnia [facility])   Discharge Condition: Improved  Diet recommendation: Regular Diet  Filed Weights   10/20/15 0539 10/22/15 0802 10/24/15 0636  Weight: 52.2 kg (115 lb 1.3 oz) 48.6 kg (107 lb 1.6 oz) 52.5 kg (115 lb 11.2 oz)    History of present illness:  Nicole Maxwell is a 70 y.o. female with a medical history significant for, but not  limited to, COPD on home O2, hypertension, peripheral vascular disease, diastolic heart failure and diabetes. Patient was discharged for several days last month for treatment of COPD exacerbation. She completed 7 days of doxycycline . Patient uses CPAP machine at night but this apparently been broken for a month. Patient continues to smoke per daughter. Patient's daughter went to check on her this morning and found the patient somnolent. EMS was called and patient found to have normal oxygen saturation on 2 L though she was lethargic, wheezy.. EMS gave her nebulizers, solumedrol 125 mg and placed on 6 liters 02. Patient complaining of bilateral leg pain for a couple of weeks. She may have taken hydrocodone last evening.  Hospital Course:  Patient is a 70 y.o. female with past medical history of Severe GOLD STAGE 4 COPD/Chronic resp failure on home O2, chronic diastolic heart failure, ongoing tobacco abuse, OSA  on CPAP (noncompliant for the past 1 month) admitted on 9/22 for acute on chronic hypercarbic respiratory failure secondary to COPD exacerbation.  Acute on chronic hypoxic/hypercarbic respiratory failure: - Required BiPAP on admission, now off - due to COPD flare and untreated OSA - has been on IV  Rocephin/zithromax,  change to PO levaquin on 10/22/2015 - CXR was clear - cut down IV solumedrol slowly, nebs, Weaned O2 to 2.5L -CM consulted to request home CPAP eval, daughter brought home CPAP and I have requested Staff to use this inpatient -On 10/22/2015, stopped IV SoluMedrol, transitioned to oral prednisone 60 mg daily. Showing slow improvement, not at her baseline, will monitor overnight -On 10/24/2015 seems to be at her baseline shortness of breath, she as discharged on Prednisone taper.   COPD exacerbation/Severe COPD Gold stage 4 -Showing slow improvement as she does not feel she is at her baseline -She will follow up with her Primary Pulmonologist Dr Colletta [place]   Tobacco abuse -counseled, she has cut down but still smokes despite using O2, educated pt on the dangers with this  Chronic diastolic heart failure:  -continue Lasix PO-increase to 40 mg 10/21/2015  Type 2 diabetes: -Discharged on her home regimen on Metformin 500 mg PO BID  Hypertension: -Last blood pressure 122/45, continue losartan and Lasix, added amlodipine during this hospitalization. Please follow up on blood pressures.   Dyslipidemia: - Continue statin  Anxiety: - Resume as needed Xanax-but at a decreased dose  Chronic pain:Pt reported to previous MD that she occasionally takes narcotics at home-but not on a  regular basis. Per daughter not on narcotics for >1year  GERD/esophageal dysmotility: Continue PPI   Discharge Exam: Vitals:   10/24/15 0636 10/24/15 0819  BP: (!) 118/98   Pulse: 92 95  Resp: 20 20  Temp: 98 F (36.7 C)     General appearance :Chronically ill-appearing, although  awake and alert Eyes:,PERRLA Neck: supple, no JVD. Resp: She continues to have poor air movement, bilateral expiratory wheezes, coarse respiratory sounds CVS: S1 S2 regular, no murmurs.  GI: Bowel sounds present, Non tender and not distended with no gaurding, rigidity or rebound.No organomegaly Extremities: B/L Lower Ext shows no edema, both legs are warm to touch Neurology:  speech clear,Non focal, sensation is grossly intact. Psychiatric: Normal judgment and insight. Alert and oriented x 3. Normal mood. Skin:No Rash, warm and dry  Discharge Instructions   Discharge Instructions    Call MD for:    Complete by:  As directed    Call MD for:  difficulty breathing, headache or visual disturbances    Complete by:  As directed    Call MD for:  extreme fatigue    Complete by:  As directed    Call MD for:  hives    Complete by:  As directed    Call MD for:  persistant dizziness or light-headedness    Complete by:  As directed    Call MD for:  persistant nausea and vomiting    Complete by:  As directed    Call MD for:  redness, tenderness, or signs of infection (pain, swelling, redness, odor or green/yellow discharge around incision site)    Complete by:  As directed    Call MD for:  severe uncontrolled pain    Complete by:  As directed    Call MD for:  temperature >100.4    Complete by:  As directed    Diet - low sodium heart healthy    Complete by:  As directed    Increase activity slowly    Complete by:  As directed      Current Discharge Medication List    START taking these medications   Details  amLODipine (NORVASC) 10 MG tablet Take 1 tablet (10 mg total) by mouth daily. Qty: 30 tablet, Refills: 1    levofloxacin (LEVAQUIN) 500 MG tablet Take 1 tablet (500 mg total) by mouth daily. Qty: 2 tablet, Refills: 0    polyethylene glycol (MIRALAX / GLYCOLAX) packet Take 17 g by mouth daily as needed for mild constipation. Qty: 14 each, Refills: 0    predniSONE (STERAPRED  UNI-PAK 21 TAB) 10 MG (21) TBPK tablet Take 6-5-4-3-2-1 tablets by mouth daily till gone. Qty: 21 tablet, Refills: 0      CONTINUE these medications which have NOT CHANGED   Details  acetaminophen (TYLENOL) 650 MG CR tablet Take 1,300 mg by mouth every 8 (eight) hours as needed for pain.    albuterol (PROAIR HFA) 108 (90 Base) MCG/ACT inhaler Inhale 2 puffs into the lungs every 6 (six) hours.    alprazolam (XANAX) 2 MG tablet Take 1-2 mg by mouth See admin instructions. Take 1/2 tablet (1 mg) by mouth during the day and 1 tablet (2 mg) at bedtime Refills: 2    aspirin EC 81 MG EC tablet Take 1 tablet (81 mg total) by mouth daily.    atorvastatin (LIPITOR) 20 MG tablet Take 20 mg by mouth daily.  Refills: 6    DULoxetine (CYMBALTA) 60 MG capsule Take 60 mg by mouth  daily. Reported on 08/01/2015    feeding supplement, GLUCERNA SHAKE, (GLUCERNA SHAKE) LIQD Take 237 mLs by mouth daily.     fluticasone (FLONASE) 50 MCG/ACT nasal spray Place 1 spray into both nostrils 2 (two) times daily. Qty: 50 g, Refills: prn   Associated Diagnoses: COPD with acute exacerbation (HCC)    furosemide (LASIX) 20 MG tablet Take 20 mg by mouth daily.     HYDROcodone-acetaminophen (NORCO/VICODIN) 5-325 MG tablet Take 1 tablet by mouth every 6 (six) hours as needed for moderate pain.    ipratropium-albuterol (DUONEB) 0.5-2.5 (3) MG/3ML SOLN Inhale 3 mLs into the lungs every 6 (six) hours.    latanoprost (XALATAN) 0.005 % ophthalmic solution Place 1 drop into both eyes at bedtime.    losartan (COZAAR) 25 MG tablet Take 25 mg by mouth daily.    metFORMIN (GLUCOPHAGE) 500 MG tablet TAKE 1 TABLET BY MOUTH TWICE DAILY Qty: 180 tablet, Refills: 0    Multiple Vitamin (MULTIVITAMIN WITH MINERALS) TABS tablet Take 1 tablet by mouth daily.    OXYGEN Inhale 2 L into the lungs continuous.    pantoprazole (PROTONIX) 40 MG tablet TAKE 1 TABLET BY MOUTH EVERY NIGHT Qty: 90 tablet, Refills: 0    potassium chloride  SA (K-DUR,KLOR-CON) 20 MEQ tablet Take 1 tablet (20 mEq total) by mouth daily. Take while on lasix Qty: 30 tablet, Refills: 0    PRESCRIPTION MEDICATION Inhale into the lungs continuous. CPAP    Tetrahydroz-Polyvinyl Al-Povid (CLEAR EYES TRIPLE ACTION) 0.05-0.5-0.6 % SOLN Place 1 drop into both eyes 3 (three) times daily as needed (dry eyes).    albuterol (PROVENTIL) (2.5 MG/3ML) 0.083% nebulizer solution Take 3 mLs (2.5 mg total) by nebulization every 4 (four) hours as needed for wheezing or shortness of breath. (may take 1 every 4 hours as needed for wheezing/shortness of breath) DX: 496 Qty: 60 vial, Refills: 12      STOP taking these medications     predniSONE (DELTASONE) 5 MG tablet        Allergies  Allergen Reactions  . Brovana [Arformoterol Tartrate] Shortness Of Breath and Cough    General intolerance  . Budesonide Shortness Of Breath and Cough    General intolerance  . Mucomyst [Acetylcysteine] Shortness Of Breath   Follow-up Information    [facility]   Specialty:  Home Health Services Why:  For home health. They will follow up with you the day after DC. Contact information: [address] [place] [address] [phone]        RAMASWAMY,MURALI, MD Follow up in 1 week(s).   Specialty:  Pulmonary Disease Contact information: [address] [place] [address] [phone]        [name], William, MD Follow up in 1 week(s).   Specialty:  Internal Medicine Contact information: [address] [place] [address] [phone]            The results of significant diagnostics from this hospitalization (including imaging, microbiology, ancillary and laboratory) are listed below for reference.    Significant Diagnostic Studies: Dg Chest Portable 1 View  Result Date: 10/17/2015 CLINICAL DATA:  Weakness and dyspnea EXAM: PORTABLE CHEST 1 VIEW COMPARISON:  09/10/2015 FINDINGS: Cardiac shadow is stable. The lungs are well  aerated bilaterally. No focal infiltrate or sizable effusion is seen. No bony abnormality is noted. IMPRESSION: No acute abnormality seen. Electronically Signed   By: Alcide CleverMark  Lukens M.D.   On: 10/17/2015 12:30    Microbiology: Recent  Results (from the past 240 hour(s))  MRSA PCR Screening     Status: None   Collection Time: 10/17/15  9:24 PM  Result Value Ref Range Status   MRSA by PCR NEGATIVE NEGATIVE Final    Comment:        The GeneXpert MRSA Assay (FDA approved for NASAL specimens only), is one component of a comprehensive MRSA colonization surveillance program. It is not intended to diagnose MRSA infection nor to guide or monitor treatment for MRSA infections.      Labs: Basic Metabolic Panel:  Recent Labs Lab 10/18/15 0423 10/19/15 0301 10/21/15 0708 10/22/15 0541 10/23/15 0536  NA 144 139 138 139 136  K 4.4 4.6 5.0 4.7 4.5  CL 98* 98* 96* 96* 96*  CO2 35* 33* 35* 33* 36*  GLUCOSE 148* 191* 162* 129* 89  BUN 19 19 24* 22* 27*  CREATININE 0.76 0.71 0.69 0.76 0.82  CALCIUM 9.0 9.2 9.0 9.1 8.8*   Liver Function Tests:  Recent Labs Lab 10/17/15 1211  AST 20  ALT 16  ALKPHOS 55  BILITOT 0.2*  PROT 6.4*  ALBUMIN 3.5   No results for input(s): LIPASE, AMYLASE in the last 168 hours. No results for input(s): AMMONIA in the last 168 hours. CBC:  Recent Labs Lab 10/17/15 1211 10/18/15 0423 10/21/15 0708 10/22/15 0541 10/23/15 0536  WBC 16.9* 8.5 14.7* 14.0* 15.3*  NEUTROABS 11.6*  --   --   --   --   HGB 9.7* 9.2* 9.6* 9.8* 9.4*  HCT 36.4 33.1* 35.0* 34.0* 32.8*  MCV 91.0 86.9 86.8 83.7 83.0  PLT 402* 365 357 269 306   Cardiac Enzymes: No results for input(s): CKTOTAL, CKMB, CKMBINDEX, TROPONINI in the last 168 hours. BNP: BNP (last 3 results)  Recent Labs  03/15/15 1430 06/30/15 1450 10/17/15 1211  BNP 45.7 133.7* 115.5*    ProBNP (last 3 results) No results for input(s): PROBNP in the last 8760 hours.  CBG:  Recent Labs Lab  10/23/15 0806 10/23/15 1158 10/23/15 1741 10/23/15 2152 10/24/15 0802  GLUCAP 80 113* 192* 197* 108*       Signed:  Jeralyn Bennett MD.  [facility] 10/24/2015, 10:59 AM

## 2015-11-03 ENCOUNTER — Encounter: Payer: Self-pay | Admitting: Internal Medicine

## 2015-11-03 ENCOUNTER — Ambulatory Visit (INDEPENDENT_AMBULATORY_CARE_PROVIDER_SITE_OTHER): Payer: Medicare Other | Admitting: Internal Medicine

## 2015-11-03 VITALS — BP 116/56 | HR 92 | Ht 65.0 in | Wt 115.0 lb

## 2015-11-03 DIAGNOSIS — F17200 Nicotine dependence, unspecified, uncomplicated: Secondary | ICD-10-CM | POA: Diagnosis not present

## 2015-11-03 DIAGNOSIS — J9612 Chronic respiratory failure with hypercapnia: Secondary | ICD-10-CM | POA: Diagnosis not present

## 2015-11-03 DIAGNOSIS — J9611 Chronic respiratory failure with hypoxia: Secondary | ICD-10-CM

## 2015-11-03 DIAGNOSIS — J449 Chronic obstructive pulmonary disease, unspecified: Secondary | ICD-10-CM | POA: Diagnosis not present

## 2015-11-03 NOTE — Progress Notes (Signed)
Subjective:     Patient ID: Nicole Maxwell, female   DOB: 1945-10-19, 70 y.o.   MRN: [id number]  HPI 4. IntubatedAlta Bernette RedbirdS Maxwell is a [age] y.o. female former smoker with COPD and O2 dependent RF.  TEST  #COPD - Oxygen dependent since 2011 - Doubtamine stress echo 05/12/10 - normal, CXR July 2011 - clear - PFTs 04/13/2010 - shows Gold stage 4 COPD with BD response/asthma component (fev1 0.5L/29%, 10% BD response on fev1 and 38% on FC), DLCO 27%) - Spirometry Jan 2013 - fev1 0.74L/37%, RAtop 44 - shows gold stage 3 copd - CAT score - 23 in May 2013 - CAT score 19 in NOv 2013   #COPD - Oxygen dependent since 2011 - Doubtamine stress echo 05/12/10 - normal, CXR July 2011 - clear - PFTs 04/13/2010 - shows Gold stage 4 COPD with BD response/asthma component (fev1 0.5L/29%, 10% BD response on fev1 and 38% on FC), DLCO 27%) - Spirometry Jan 2013 - fev1 0.74L/37%, RAtop 44 - shows gold stage 3 copd - Class 2-3 exertional dyspnea with precipitants emotion, smoke, lying flat, weather change, pollen, odors - Chronic cough with white sputjum - CAT score - 23 in May 2013 - CAT score 19 in NOv 2013  #Smoking  #Chronic back pain - s/p lumbar fusion surgery ? Date - Preop pulmonary evaluatio prior to spine surgery - 2013, surgery by Dr Danielle DessElsner never happend  #There is no history of allergies, aspirin allergies, asthma, bronchiolitis, CAD, DVT, a heart failure, PE, pneumonia  # Poor ability to understand questions and answer them. Difficulty following med advice - noticed since march 2013   08/01/2015 2-3 month Follow Up Visit:  Pt. Presents to the office for follow up. She has had a recent admission for a COPD exacerbations and left lower lobe pneumonia.Nicole Maxwell. She was treated with IV antibiotics , steroids and scheduled nebulizer treatment. She was discharged on levaquin and duonebs and a prednisone taper, but shae is maintained on a 5 mg dose until 7/19 when she goes back to see  Dr. Clelia CroftShaw.She had a CXR 07/03/15 which shows resolution of pneumonia. She has been doing well. She states she does have some trouble with the hot, humid air and increased dyspnea on exertion.. She feels her shortness of breath is at baseline. Secretions are clear to white. She denies any fever, chest pain, orthopnea, or hemoptysis.She has no edema in her lower extremities. Her weight is down to 112.8 lbs. She is taking her medications as prescribed. She states she is using her nocturnal BiPAP at night most of the time. She is continuing to smoke 2-3 cigarettes daily. She states she does use her Pro Air 2-3 times daily.   Significant Events:Recent Hospital Admis  OV 11/03/2015  Chief Complaint  Patient presents with  . Follow-up    Pt states her SOB has improved. Pt states she feels she does not have to work as hard to breathe as she once did. Pt c/o prod cough with yellow mucus and chest tightness when SOB or anxious. Pt denies f/c/s.    Nicole IbaAlta S Romm 70 y.o.  end-stage COPD with ataxia now on home BiPAP therapy and oxygen therapy. Last visit was in July 2017 that was in the heels of a COPD exacerbation in June 2017. There is a routine follow-up but in June 2017 got admitted again for COPD exacerbation and was treated with acute BiPAP therapy. She is back to baseline. She's finished Levaquin and prednisone burst. She is  now on chronic daily prednisone. She feels baseline. Her daughter Nicole Maxwell with her at this point in time. Overall just significant deconditioning but stable health. According to the daughter she's been started on Norvasc at discharge for hypertension and in the last 2 days she's had edema. This norvac was only started 1 or 2 days before the onset of edema at home. She feels good otherwise.    has a past medical history of Alcohol abuse, in remission (quit 2004); Anemia of chronic disease; Anxiety; Arthritis; Asthma; Chronic back pain; Chronic respiratory failure with hypercapnia (HCC); COPD  (chronic obstructive pulmonary disease) (HCC); COPD exacerbation (HCC); Dysphagia; Esophageal dysmotility; HCAP (healthcare-associated pneumonia); Hyperlipidemia; Hypertension; Leukocytosis; Neuromuscular disorder (HCC); On home oxygen therapy; Osteopenia; Physical deconditioning; Pneumonia; Protein calorie malnutrition (HCC); PVD (peripheral vascular disease) (HCC); Shortness of breath; Tobacco abuse; Type II diabetes mellitus (HCC); and Vitamin D deficiency disease.   reports that she has been smoking Cigarettes.  She has a 14.00 pack-year smoking history. She has never used smokeless tobacco.  Past Surgical History:  Procedure Laterality Date  . ABDOMINAL HYSTERECTOMY     "partial"  . APPENDECTOMY  1960  . BACK SURGERY    . CARPAL TUNNEL RELEASE Bilateral    CTS repair 2000 (R), 2006 (L)/notes 01/01/2014  . CATARACT EXTRACTION W/ INTRAOCULAR LENS  IMPLANT, BILATERAL Bilateral 2015  . LUMBAR LAMINECTOMY/DECOMPRESSION MICRODISCECTOMY  2005   L4-5/notes 03/06/2010  . POSTERIOR LUMBAR FUSION  2002  . TONSILLECTOMY  1960    Allergies  Allergen Reactions  . Brovana [Arformoterol Tartrate] Shortness Of Breath and Cough    General intolerance  . Budesonide Shortness Of Breath and Cough    General intolerance  . Mucomyst [Acetylcysteine] Shortness Of Breath    Immunization History  Administered Date(s) Administered  . Influenza Split 10/26/2010, 10/08/2011  . Influenza, High Dose Seasonal PF 11/14/2014, 09/24/2015  . Influenza-Unspecified 11/25/2013  . Pneumococcal Polysaccharide-23 10/26/2010    Family History  Problem Relation Age of Onset  . Heart attack Sister 28    CABG  . Breast cancer Sister   . Cancer Brother   . Heart attack Brother   . Hypertension Other     all siblings  . Anesthesia problems Neg Hx      Current Outpatient Prescriptions:  .  acetaminophen (TYLENOL) 650 MG CR tablet, Take 1,300 mg by mouth every 8 (eight) hours as needed for pain., Disp: , Rfl:  .   albuterol (PROAIR HFA) 108 (90 Base) MCG/ACT inhaler, Inhale 2 puffs into the lungs every 6 (six) hours., Disp: , Rfl:  .  albuterol (PROVENTIL) (2.5 MG/3ML) 0.083% nebulizer solution, Take 3 mLs (2.5 mg total) by nebulization every 4 (four) hours as needed for wheezing or shortness of breath. (may take 1 every 4 hours as needed for wheezing/shortness of breath) DX: 496, Disp: 60 vial, Rfl: 12 .  alprazolam (XANAX) 2 MG tablet, Take 1-2 mg by mouth See admin instructions. Take 1/2 tablet (1 mg) by mouth during the day and 1 tablet (2 mg) at bedtime, Disp: , Rfl: 2 .  amLODipine (NORVASC) 10 MG tablet, Take 1 tablet (10 mg total) by mouth daily., Disp: 30 tablet, Rfl: 1 .  aspirin EC 81 MG EC tablet, Take 1 tablet (81 mg total) by mouth daily., Disp: , Rfl:  .  atorvastatin (LIPITOR) 20 MG tablet, Take 20 mg by mouth daily. , Disp: , Rfl: 6 .  DULoxetine (CYMBALTA) 60 MG capsule, Take 60 mg by mouth daily. Reported  on 08/01/2015, Disp: , Rfl:  .  feeding supplement, GLUCERNA SHAKE, (GLUCERNA SHAKE) LIQD, Take 237 mLs by mouth daily. , Disp: , Rfl:  .  fluticasone (FLONASE) 50 MCG/ACT nasal spray, Place 1 spray into both nostrils 2 (two) times daily. (Patient taking differently: Place 2 sprays into both nostrils daily. ), Disp: 50 g, Rfl: prn .  furosemide (LASIX) 20 MG tablet, Take 20 mg by mouth daily. , Disp: , Rfl:  .  HYDROcodone-acetaminophen (NORCO/VICODIN) 5-325 MG tablet, Take 1 tablet by mouth every 6 (six) hours as needed for moderate pain., Disp: , Rfl:  .  ipratropium-albuterol (DUONEB) 0.5-2.5 (3) MG/3ML SOLN, Inhale 3 mLs into the lungs every 6 (six) hours., Disp: , Rfl:  .  latanoprost (XALATAN) 0.005 % ophthalmic solution, Place 1 drop into both eyes at bedtime., Disp: , Rfl:  .  losartan (COZAAR) 25 MG tablet, Take 25 mg by mouth daily., Disp: , Rfl:  .  metFORMIN (GLUCOPHAGE) 500 MG tablet, TAKE 1 TABLET BY MOUTH TWICE DAILY, Disp: 180 tablet, Rfl: 0 .  Multiple Vitamin (MULTIVITAMIN  WITH MINERALS) TABS tablet, Take 1 tablet by mouth daily., Disp: , Rfl:  .  OXYGEN, Inhale 2 L into the lungs continuous., Disp: , Rfl:  .  pantoprazole (PROTONIX) 40 MG tablet, TAKE 1 TABLET BY MOUTH EVERY NIGHT (Patient taking differently: TAKE 1 TABLET BY MOUTH EVERY MORNING), Disp: 90 tablet, Rfl: 0 .  polyethylene glycol (MIRALAX / GLYCOLAX) packet, Take 17 g by mouth daily as needed for mild constipation., Disp: 14 each, Rfl: 0 .  potassium chloride SA (K-DUR,KLOR-CON) 20 MEQ tablet, Take 1 tablet (20 mEq total) by mouth daily. Take while on lasix, Disp: 30 tablet, Rfl: 0 .  predniSONE (DELTASONE) 5 MG tablet, Take 5 mg by mouth daily with breakfast., Disp: , Rfl:  .  PRESCRIPTION MEDICATION, Inhale into the lungs continuous. CPAP, Disp: , Rfl:  .  Tetrahydroz-Polyvinyl Al-Povid (CLEAR EYES TRIPLE ACTION) 0.05-0.5-0.6 % SOLN, Place 1 drop into both eyes 3 (three) times daily as needed (dry eyes)., Disp: , Rfl:    Review of Systems     Objective:   Physical Exam  Constitutional: She is oriented to person, place, and time. She appears well-developed and well-nourished. No distress.  Sitting in a wheelchair Extremely deconditioned cachectic   HENT:  Head: Normocephalic and atraumatic.  Right Ear: External ear normal.  Left Ear: External ear normal.  Mouth/Throat: Oropharynx is clear and moist. No oropharyngeal exudate.  Oxygen on  Eyes: Conjunctivae and EOM are normal. Pupils are equal, round, and reactive to light. Right eye exhibits no discharge. Left eye exhibits no discharge. No scleral icterus.  Neck: Normal range of motion. Neck supple. No JVD present. No tracheal deviation present. No thyromegaly present.  Cardiovascular: Normal rate, regular rhythm, normal heart sounds and intact distal pulses.  Exam reveals no gallop and no friction rub.   No murmur heard. Pulmonary/Chest: Effort normal and breath sounds normal. No respiratory distress. She has no wheezes. She has no rales.  She exhibits no tenderness.  Scattered nonspecific mild wheezes  Abdominal: Soft. Bowel sounds are normal. She exhibits no distension and no mass. There is no tenderness. There is no rebound and no guarding.  Musculoskeletal: Normal range of motion. She exhibits edema. She exhibits no tenderness.  Mild pedal edema present bilaterally at ankle level  Lymphadenopathy:    She has no cervical adenopathy.  Neurological: She is alert and oriented to person, place, and time. She  has normal reflexes. No cranial nerve deficit. She exhibits normal muscle tone. Coordination normal.  Skin: Skin is warm and dry. No rash noted. She is not diaphoretic. No erythema. No pallor.  Psychiatric: She has a normal mood and affect. Her behavior is normal. Judgment and thought content normal.  Vitals reviewed.   Vitals:   11/03/15 1219  BP: (!) 116/56  Pulse: 92  SpO2: 97%  Weight: 115 lb (52.2 kg)  Height: 5\' 5"  (1.651 m)   Body mass index is 19.14 kg/m.      Assessment:       ICD-9-CM ICD-10-CM   1. Chronic respiratory failure with hypoxia and hypercapnia (HCC) 518.83 J96.11    799.02 J96.12    786.09    2. Severe chronic obstructive pulmonary disease (HCC) 496 J44.9   3. Smoking 305.1 F17.200        Plan:      Glad you are better BP 116 systolic 11/03/2015 and acceptable but norvasc could be causing leg swelling  PLAN Quit smoking Continue home PT, oxygen, nocturnal BiPAP, and nebulizers Discuss leg swelling with PCP [name], MD   Followup  2-3 months with Dr Marchelle Gearing or sooner if needed   Dr. Kalman Shan, M.D., [facility].C.P Pulmonary and Critical Care Medicine Staff Physician  System [facility] Pager: [phone], If no answer or between  15:00h - 7:00h: call [phone]  11/03/2015 12:41 PM

## 2015-11-03 NOTE — Patient Instructions (Signed)
ICD-9-CM ICD-10-CM   1. Chronic respiratory failure with hypoxia and hypercapnia (HCC) 518.83 J96.11    799.02 J96.12    786.09    2. Severe chronic obstructive pulmonary disease (HCC) 496 J44.9   3. Smoking 305.1 F17.200     Glad you are better BP 116 systolic 11/03/2015 and acceptable but norvasc could be causing leg swelling  PLAN Quit smoking Continue home PT, oxygen, nocturnal BiPAP, and nebulizers Discuss leg swelling with PCP Nicole Maxwell, William, MD   Followup  2-3 months with Dr Marchelle Gearingamaswamy or sooner if needed

## 2015-11-14 ENCOUNTER — Encounter: Payer: Self-pay | Admitting: Internal Medicine

## 2015-11-24 ENCOUNTER — Inpatient Hospital Stay (HOSPITAL_COMMUNITY)
Admission: EM | Admit: 2015-11-24 | Discharge: 2015-11-30 | DRG: 871 | Disposition: A | Discharge status: Skilled Nursing Facility | Payer: Medicare Other | Attending: Internal Medicine | Admitting: Internal Medicine

## 2015-11-24 ENCOUNTER — Emergency Department (HOSPITAL_COMMUNITY): Payer: Medicare Other

## 2015-11-24 ENCOUNTER — Encounter (HOSPITAL_COMMUNITY): Payer: Self-pay | Admitting: Emergency Medicine

## 2015-11-24 DIAGNOSIS — A419 Sepsis, unspecified organism: Secondary | ICD-10-CM | POA: Diagnosis present

## 2015-11-24 DIAGNOSIS — F419 Anxiety disorder, unspecified: Secondary | ICD-10-CM | POA: Diagnosis present

## 2015-11-24 DIAGNOSIS — J441 Chronic obstructive pulmonary disease with (acute) exacerbation: Secondary | ICD-10-CM | POA: Diagnosis not present

## 2015-11-24 DIAGNOSIS — Z8249 Family history of ischemic heart disease and other diseases of the circulatory system: Secondary | ICD-10-CM

## 2015-11-24 DIAGNOSIS — E1151 Type 2 diabetes mellitus with diabetic peripheral angiopathy without gangrene: Secondary | ICD-10-CM | POA: Diagnosis present

## 2015-11-24 DIAGNOSIS — Z9981 Dependence on supplemental oxygen: Secondary | ICD-10-CM

## 2015-11-24 DIAGNOSIS — E119 Type 2 diabetes mellitus without complications: Secondary | ICD-10-CM

## 2015-11-24 DIAGNOSIS — E785 Hyperlipidemia, unspecified: Secondary | ICD-10-CM | POA: Diagnosis present

## 2015-11-24 DIAGNOSIS — Y95 Nosocomial condition: Secondary | ICD-10-CM | POA: Diagnosis present

## 2015-11-24 DIAGNOSIS — D638 Anemia in other chronic diseases classified elsewhere: Secondary | ICD-10-CM | POA: Diagnosis present

## 2015-11-24 DIAGNOSIS — J9602 Acute respiratory failure with hypercapnia: Secondary | ICD-10-CM

## 2015-11-24 DIAGNOSIS — F17210 Nicotine dependence, cigarettes, uncomplicated: Secondary | ICD-10-CM | POA: Diagnosis present

## 2015-11-24 DIAGNOSIS — J189 Pneumonia, unspecified organism: Secondary | ICD-10-CM | POA: Diagnosis present

## 2015-11-24 DIAGNOSIS — Z961 Presence of intraocular lens: Secondary | ICD-10-CM | POA: Diagnosis present

## 2015-11-24 DIAGNOSIS — G8929 Other chronic pain: Secondary | ICD-10-CM | POA: Diagnosis present

## 2015-11-24 DIAGNOSIS — Z7982 Long term (current) use of aspirin: Secondary | ICD-10-CM | POA: Diagnosis not present

## 2015-11-24 DIAGNOSIS — J9620 Acute and chronic respiratory failure, unspecified whether with hypoxia or hypercapnia: Secondary | ICD-10-CM | POA: Diagnosis not present

## 2015-11-24 DIAGNOSIS — J449 Chronic obstructive pulmonary disease, unspecified: Secondary | ICD-10-CM | POA: Diagnosis present

## 2015-11-24 DIAGNOSIS — J9690 Respiratory failure, unspecified, unspecified whether with hypoxia or hypercapnia: Secondary | ICD-10-CM | POA: Diagnosis present

## 2015-11-24 DIAGNOSIS — J9622 Acute and chronic respiratory failure with hypercapnia: Secondary | ICD-10-CM | POA: Diagnosis present

## 2015-11-24 DIAGNOSIS — K224 Dyskinesia of esophagus: Secondary | ICD-10-CM | POA: Diagnosis present

## 2015-11-24 DIAGNOSIS — Z9119 Patient's noncompliance with other medical treatment and regimen: Secondary | ICD-10-CM

## 2015-11-24 DIAGNOSIS — Z888 Allergy status to other drugs, medicaments and biological substances status: Secondary | ICD-10-CM

## 2015-11-24 DIAGNOSIS — J440 Chronic obstructive pulmonary disease with acute lower respiratory infection: Secondary | ICD-10-CM | POA: Diagnosis present

## 2015-11-24 DIAGNOSIS — J9621 Acute and chronic respiratory failure with hypoxia: Secondary | ICD-10-CM | POA: Diagnosis present

## 2015-11-24 DIAGNOSIS — J13 Pneumonia due to Streptococcus pneumoniae: Secondary | ICD-10-CM | POA: Diagnosis not present

## 2015-11-24 DIAGNOSIS — Z79899 Other long term (current) drug therapy: Secondary | ICD-10-CM

## 2015-11-24 DIAGNOSIS — I110 Hypertensive heart disease with heart failure: Secondary | ICD-10-CM | POA: Diagnosis present

## 2015-11-24 DIAGNOSIS — I5032 Chronic diastolic (congestive) heart failure: Secondary | ICD-10-CM | POA: Diagnosis present

## 2015-11-24 DIAGNOSIS — Z7984 Long term (current) use of oral hypoglycemic drugs: Secondary | ICD-10-CM | POA: Diagnosis not present

## 2015-11-24 DIAGNOSIS — Z7952 Long term (current) use of systemic steroids: Secondary | ICD-10-CM

## 2015-11-24 DIAGNOSIS — R410 Disorientation, unspecified: Secondary | ICD-10-CM | POA: Diagnosis present

## 2015-11-24 DIAGNOSIS — Z720 Tobacco use: Secondary | ICD-10-CM | POA: Diagnosis present

## 2015-11-24 DIAGNOSIS — E872 Acidosis: Secondary | ICD-10-CM | POA: Diagnosis present

## 2015-11-24 DIAGNOSIS — J9601 Acute respiratory failure with hypoxia: Secondary | ICD-10-CM | POA: Diagnosis not present

## 2015-11-24 DIAGNOSIS — Z981 Arthrodesis status: Secondary | ICD-10-CM | POA: Diagnosis not present

## 2015-11-24 DIAGNOSIS — M549 Dorsalgia, unspecified: Secondary | ICD-10-CM | POA: Diagnosis present

## 2015-11-24 DIAGNOSIS — R0600 Dyspnea, unspecified: Secondary | ICD-10-CM

## 2015-11-24 DIAGNOSIS — R1310 Dysphagia, unspecified: Secondary | ICD-10-CM

## 2015-11-24 DIAGNOSIS — J181 Lobar pneumonia, unspecified organism: Secondary | ICD-10-CM | POA: Diagnosis not present

## 2015-11-24 LAB — COMPREHENSIVE METABOLIC PANEL
ALBUMIN: 3.7 g/dL (ref 3.5–5.0)
ALK PHOS: 63 U/L (ref 38–126)
ALT: 16 U/L (ref 14–54)
AST: 22 U/L (ref 15–41)
Anion gap: 13 (ref 5–15)
BUN: 19 mg/dL (ref 6–20)
CALCIUM: 9.4 mg/dL (ref 8.9–10.3)
CO2: 29 mmol/L (ref 22–32)
CREATININE: 0.88 mg/dL (ref 0.44–1.00)
Chloride: 97 mmol/L — ABNORMAL LOW (ref 101–111)
GFR calc Af Amer: >60 mL/min (ref >60)
GFR calc non Af Amer: >60 mL/min (ref >60)
GLUCOSE: 106 mg/dL — AB (ref 65–99)
Potassium: 4.6 mmol/L (ref 3.5–5.1)
SODIUM: 139 mmol/L (ref 135–145)
Total Bilirubin: 0.8 mg/dL (ref 0.3–1.2)
Total Protein: 6.6 g/dL (ref 6.5–8.1)

## 2015-11-24 LAB — I-STAT ARTERIAL BLOOD GAS, ED
Acid-Base Excess: 8 mmol/L — ABNORMAL HIGH (ref 0.0–2.0)
Bicarbonate: 36.5 mmol/L — ABNORMAL HIGH (ref 20.0–28.0)
O2 SAT: 95 %
PCO2 ART: 68.7 mmHg — AB (ref 32.0–48.0)
PO2 ART: 87 mmHg (ref 83.0–108.0)
Patient temperature: 98.6
TCO2: 39 mmol/L (ref 0–100)
pH, Arterial: 7.333 — ABNORMAL LOW (ref 7.350–7.450)

## 2015-11-24 LAB — I-STAT CG4 LACTIC ACID, ED: Lactic Acid, Venous: 1.41 mmol/L (ref 0.5–1.9)

## 2015-11-24 LAB — CBC
HCT: 37.1 % (ref 36.0–46.0)
HEMOGLOBIN: 10.8 g/dL — AB (ref 12.0–15.0)
MCH: 24.3 pg — ABNORMAL LOW (ref 26.0–34.0)
MCHC: 29.1 g/dL — ABNORMAL LOW (ref 30.0–36.0)
MCV: 83.6 fL (ref 78.0–100.0)
Platelets: 369 10*3/uL (ref 150–400)
RBC: 4.44 MIL/uL (ref 3.87–5.11)
RDW: 17.3 % — ABNORMAL HIGH (ref 11.5–15.5)
WBC: 17 10*3/uL — ABNORMAL HIGH (ref 4.0–10.5)

## 2015-11-24 LAB — I-STAT TROPONIN, ED: Troponin i, poc: 0.02 ng/mL (ref 0.00–0.08)

## 2015-11-24 LAB — BRAIN NATRIURETIC PEPTIDE: B Natriuretic Peptide: 66.2 pg/mL (ref 0.0–100.0)

## 2015-11-24 MED ORDER — IPRATROPIUM-ALBUTEROL 0.5-2.5 (3) MG/3ML IN SOLN
RESPIRATORY_TRACT | Status: AC
  Filled 2015-11-24: qty 6

## 2015-11-24 MED ORDER — ALBUTEROL (5 MG/ML) CONTINUOUS INHALATION SOLN
10.0000 mg/h | INHALATION_SOLUTION | RESPIRATORY_TRACT | Status: DC
  Administered 2015-11-24: 10 mg/h via RESPIRATORY_TRACT

## 2015-11-24 MED ORDER — VANCOMYCIN HCL IN DEXTROSE 1-5 GM/200ML-% IV SOLN
1000.0000 mg | Freq: Once | INTRAVENOUS | Status: AC
  Administered 2015-11-24: 1000 mg via INTRAVENOUS
  Filled 2015-11-24: qty 200

## 2015-11-24 MED ORDER — IPRATROPIUM-ALBUTEROL 0.5-2.5 (3) MG/3ML IN SOLN
6.0000 mL | Freq: Once | RESPIRATORY_TRACT | Status: AC
  Administered 2015-11-24: 6 mL via RESPIRATORY_TRACT

## 2015-11-24 MED ORDER — PIPERACILLIN-TAZOBACTAM 3.375 G IVPB 30 MIN
3.3750 g | Freq: Once | INTRAVENOUS | Status: AC
  Administered 2015-11-24: 3.375 g via INTRAVENOUS
  Filled 2015-11-24: qty 50

## 2015-11-24 MED ORDER — ALBUTEROL (5 MG/ML) CONTINUOUS INHALATION SOLN
INHALATION_SOLUTION | RESPIRATORY_TRACT | Status: AC
  Filled 2015-11-24: qty 20

## 2015-11-24 MED ORDER — VANCOMYCIN HCL IN DEXTROSE 750-5 MG/150ML-% IV SOLN
750.0000 mg | Freq: Two times a day (BID) | INTRAVENOUS | Status: DC
Start: 2015-11-25 — End: 2015-11-27
  Administered 2015-11-25 – 2015-11-26 (×4): 750 mg via INTRAVENOUS
  Filled 2015-11-24 (×7): qty 150

## 2015-11-24 MED ORDER — PIPERACILLIN-TAZOBACTAM 3.375 G IVPB
3.3750 g | Freq: Three times a day (TID) | INTRAVENOUS | Status: DC
  Administered 2015-11-25 – 2015-11-27 (×7): 3.375 g via INTRAVENOUS
  Filled 2015-11-24 (×9): qty 50

## 2015-11-24 NOTE — ED Notes (Signed)
Doctor at bedside.

## 2015-11-24 NOTE — ED Notes (Signed)
RT at bedside.

## 2015-11-24 NOTE — ED Notes (Signed)
Called xray for STAT portable xray  

## 2015-11-24 NOTE — ED Notes (Signed)
First set of blood cultures drawn.

## 2015-11-24 NOTE — ED Notes (Signed)
Doctor ordered patient to trial nasal cannula 2L RT notified.

## 2015-11-24 NOTE — Progress Notes (Signed)
Pharmacy Antibiotic Note Nicole Maxwell is a 70 y.o. female admitted on 11/24/2015 with sepsis/concern for LLL PNA.  Pharmacy has been consulted for Zosyn and vancomycin dosing.  Plan: 1. Vancomycin 750 IV every 12 hours.  Goal trough 15-20 mcg/mL. 2. Zosyn 3.375g IV q8h (4 hour infusion).  3. SCr every 72 hours while on vancomycin   Height: 5\' 5"  (165.1 cm) Weight: 115 lb (52.2 kg) IBW/kg (Calculated) : 57  Temp (24hrs), Avg:101.7 F (38.7 C), Min:101.7 F (38.7 C), Max:101.7 F (38.7 C)   Recent Labs Lab 11/24/15 2145 11/24/15 2218  WBC 17.0*  --   CREATININE 0.88  --   LATICACIDVEN  --  1.41    Estimated Creatinine Clearance: 48.3 mL/min (by C-G formula based on SCr of 0.88 mg/dL).    Allergies  Allergen Reactions  . Brovana [Arformoterol Tartrate] Shortness Of Breath and Cough    General intolerance  . Budesonide Shortness Of Breath and Cough    General intolerance  . Mucomyst [Acetylcysteine] Shortness Of Breath    Antimicrobials this admission: 10/30 Vancomycin >>  10/30 Zosyn >>   Dose adjustments this admission: n/a  Microbiology results: 10/30 BCx: px 10/30 UCx: px    Thank you for allowing pharmacy to be a part of this patient's care.  Pollyann SamplesAndy Mellisa Arshad, PharmD, BCPS 11/24/2015, 10:49 PM Pager: [phone]

## 2015-11-24 NOTE — ED Triage Notes (Signed)
Arrived via EMS lives at home family called for shortness of breath and lethargy. EMS reported fire obtained pulse oximetry 50's EMS placed on cpap increased 98%. Administered 2 Duo nebs and Solumedrol 125mg  IVP. CBG 130. Upon arrival patient alert to name.

## 2015-11-24 NOTE — ED Provider Notes (Addendum)
[facility]-EMERGENCY DEPT Provider Note   CSN: [id number] Arrival date & time: 11/24/15  2117     History   Chief Complaint Chief Complaint  Patient presents with  . Shortness of Breath  . Fatigue    HPI Nicole Maxwell is a 70 y.o. female.  The history is provided by the EMS personnel and the patient.  Shortness of Breath  This is a recurrent problem. Duration: Several days. The problem has been gradually worsening. Associated symptoms include cough and sputum production (Baseline). Pertinent negatives include no fever, no orthopnea, no chest pain, no abdominal pain and no leg swelling. She has tried nothing for the symptoms. Associated medical issues include asthma, COPD and heart failure.   Patient was found by family members at home minimally responsive and off her oxygen. They called EMS and the patient was noted to be hypoxic with saturations in the 50s. They note that the patient had decreased air movement and was noted to have wheezing. They placed her on CPAP and gave her 2 DuoNeb's and Solu-Medrol with improvement of her saturations.  Past Medical History:  Diagnosis Date  . Alcohol abuse, in remission quit 2004  . Anemia of chronic disease   . Anxiety    Xanax prn  . Arthritis    "my whole body"  . Asthma   . Chronic back pain    "goes from the lower part of my back on down my legs"  (04/17/2014)  . Chronic respiratory failure with hypercapnia (HCC)   . COPD (chronic obstructive pulmonary disease) (HCC)    cxr 04/03/2010 - hyperinflation  . COPD exacerbation (HCC)   . Dysphagia   . Esophageal dysmotility   . HCAP (healthcare-associated pneumonia)   . Hyperlipidemia   . Hypertension   . Leukocytosis   . Neuromuscular disorder (HCC)    lumbar disc  . On home oxygen therapy    "2L; suppose to be 24/7" (04/17/2014)  . Osteopenia   . Physical deconditioning   . Pneumonia    "3 times in the last month or so" (04/17/2014)  . Protein calorie malnutrition (HCC)   . PVD  (peripheral vascular disease) (HCC)   . Shortness of breath    with anxiety  and activty  . Tobacco abuse    " I am addicted"   . Type II diabetes mellitus (HCC)   . Vitamin D deficiency disease    03/26/2010 - 9.9ng/mL. Started on replacemnt    Patient Active Problem List   Diagnosis Date Noted  . Pneumonia 11/24/2015  . Acute respiratory failure with hypercapnia (HCC) 10/17/2015  . Anemia 06/30/2015  . CAP (community acquired pneumonia) 06/30/2015  . Acute encephalopathy 06/30/2015  . Tobacco use 06/30/2015  . Abnormal echocardiogram 04/07/2015  . Protein-calorie malnutrition, severe 03/23/2015  . Acute respiratory failure with hypoxia and hypercarbia (HCC) 03/22/2015  . Slow transit constipation 01/07/2015  . Normocytic anemia 11/27/2014  . Essential hypertension 11/27/2014  . Leukocytosis 11/26/2014  . Physical deconditioning 07/01/2014  . Smoking 07/01/2014  . Pulmonary infiltrate in left lung on chest x-ray 04/16/2014  . DM type 2 (diabetes mellitus, type 2) (HCC) 04/16/2014  . Chronic respiratory failure (HCC) 04/16/2014  . Vitamin D deficiency disease 04/16/2014  . Pulmonary infiltrate 04/16/2014  . Esophageal dysmotility 02/27/2014  . HCAP (healthcare-associated pneumonia) 02/21/2014  . Healthcare-associated pneumonia 02/21/2014  . Abdominal pain, epigastric 01/09/2014  . Chronic diastolic congestive heart failure (HCC) 01/07/2014  . Malnutrition of moderate degree (HCC) 01/03/2014  .  Acute on chronic respiratory failure (HCC) 01/03/2014  . COPD with acute exacerbation (HCC) 01/01/2014  . Hyponatremia 04/09/2012    Class: Acute  . Diabetes mellitus type 2, noninsulin dependent (HCC) 04/06/2012  . COPD exacerbation (HCC) 12/09/2011  . Preop pulmonary/respiratory exam 02/28/2011  . HTN (hypertension) 05/12/2010  . Hyperlipidemia 05/12/2010  . Severe chronic obstructive pulmonary disease (HCC) 04/29/2010  . Chest pain 04/29/2010    Past Surgical History:    Procedure Laterality Date  . ABDOMINAL HYSTERECTOMY     "partial"  . APPENDECTOMY  1960  . BACK SURGERY    . CARPAL TUNNEL RELEASE Bilateral    CTS repair 2000 (R), 2006 (L)/notes 01/01/2014  . CATARACT EXTRACTION W/ INTRAOCULAR LENS  IMPLANT, BILATERAL Bilateral 2015  . LUMBAR LAMINECTOMY/DECOMPRESSION MICRODISCECTOMY  2005   L4-5/notes 03/06/2010  . POSTERIOR LUMBAR FUSION  2002  . TONSILLECTOMY  1960    OB History    No data available       Home Medications    Prior to Admission medications   Medication Sig Start Date End Date Taking? Authorizing Provider  acetaminophen (TYLENOL) 650 MG CR tablet Take 1,300 mg by mouth every 8 (eight) hours as needed for pain.   Yes Historical Provider, MD  albuterol (PROAIR HFA) 108 (90 Base) MCG/ACT inhaler Inhale 2 puffs into the lungs every 6 (six) hours.   Yes Historical Provider, MD  albuterol (PROVENTIL) (2.5 MG/3ML) 0.083% nebulizer solution Take 3 mLs (2.5 mg total) by nebulization every 4 (four) hours as needed for wheezing or shortness of breath. (may take 1 every 4 hours as needed for wheezing/shortness of breath) DX: 496 01/09/14  Yes Martha ClanWilliam Shaw, MD  alprazolam Prudy Feeler(XANAX) 2 MG tablet Take 1-2 mg by mouth See admin instructions. Take 1/2 tablet (1 mg) by mouth during the day and 1 tablet (2 mg) at bedtime 06/21/15  Yes Historical Provider, MD  amLODipine (NORVASC) 10 MG tablet Take 1 tablet (10 mg total) by mouth daily. 10/25/15  Yes Jeralyn BennettEzequiel Zamora, MD  aspirin EC 81 MG EC tablet Take 1 tablet (81 mg total) by mouth daily. 01/09/14  Yes Martha ClanWilliam Shaw, MD  atorvastatin (LIPITOR) 20 MG tablet Take 20 mg by mouth daily.  09/14/14  Yes Historical Provider, MD  DULoxetine (CYMBALTA) 60 MG capsule Take 60 mg by mouth daily. Reported on 08/01/2015   Yes Historical Provider, MD  feeding supplement, GLUCERNA SHAKE, (GLUCERNA SHAKE) LIQD Take 237 mLs by mouth daily.    Yes Historical Provider, MD  fluticasone (FLONASE) 50 MCG/ACT nasal spray Place 1  spray into both nostrils 2 (two) times daily. Patient taking differently: Place 2 sprays into both nostrils daily.  12/23/14  Yes Sharee Holstereborah S Green, NP  furosemide (LASIX) 20 MG tablet Take 20 mg by mouth daily.    Yes Historical Provider, MD  HYDROcodone-acetaminophen (NORCO/VICODIN) 5-325 MG tablet Take 1 tablet by mouth every 6 (six) hours as needed for moderate pain.   Yes Historical Provider, MD  ipratropium-albuterol (DUONEB) 0.5-2.5 (3) MG/3ML SOLN Inhale 3 mLs into the lungs every 6 (six) hours as needed.  10/02/15  Yes Historical Provider, MD  latanoprost (XALATAN) 0.005 % ophthalmic solution Place 1 drop into both eyes at bedtime. 10/15/15  Yes Historical Provider, MD  losartan (COZAAR) 25 MG tablet Take 25 mg by mouth daily. 10/01/15  Yes Historical Provider, MD  metFORMIN (GLUCOPHAGE) 500 MG tablet TAKE 1 TABLET BY MOUTH TWICE DAILY 01/07/15  Yes Sharee Holstereborah S Green, NP  Multiple Vitamin (MULTIVITAMIN WITH  MINERALS) TABS tablet Take 1 tablet by mouth daily.   Yes Historical Provider, MD  OXYGEN Inhale 2 L into the lungs continuous.   Yes Historical Provider, MD  pantoprazole (PROTONIX) 40 MG tablet TAKE 1 TABLET BY MOUTH EVERY NIGHT Patient taking differently: TAKE 1 TABLET BY MOUTH EVERY MORNING 01/07/15  Yes Sharee Holster, NP  polyethylene glycol (MIRALAX / GLYCOLAX) packet Take 17 g by mouth daily as needed for mild constipation. 10/24/15  Yes Jeralyn Bennett, MD  potassium chloride SA (K-DUR,KLOR-CON) 20 MEQ tablet Take 1 tablet (20 mEq total) by mouth daily. Take while on lasix 12/12/14  Yes Jeanella Craze, NP  predniSONE (DELTASONE) 5 MG tablet Take 5 mg by mouth daily with breakfast.   Yes Historical Provider, MD  PRESCRIPTION MEDICATION Inhale into the lungs continuous. CPAP   Yes Historical Provider, MD  Tetrahydroz-Polyvinyl Al-Povid (CLEAR EYES TRIPLE ACTION) 0.05-0.5-0.6 % SOLN Place 1 drop into both eyes 3 (three) times daily as needed (dry eyes).   Yes Historical Provider, MD     Family History Family History  Problem Relation Age of Onset  . Heart attack Sister 58    CABG  . Breast cancer Sister   . Cancer Brother   . Heart attack Brother   . Hypertension Other     all siblings  . Anesthesia problems Neg Hx     Social History Social History  Substance Use Topics  . Smoking status: Current Some Day Smoker    Packs/day: 0.25    Years: 56.00    Types: Cigarettes  . Smokeless tobacco: Never Used     Comment: 5 cigs per week/10.9.17  . Alcohol use 0.0 oz/week     Comment: h/o heavy ETOH quit 2004     Allergies   Brovana [arformoterol tartrate]; Budesonide; and Mucomyst [acetylcysteine]   Review of Systems Review of Systems  Constitutional: Negative for fever.  Respiratory: Positive for cough, sputum production (Baseline) and shortness of breath.   Cardiovascular: Negative for chest pain, orthopnea and leg swelling.  Gastrointestinal: Negative for abdominal pain.  Ten systems are reviewed and are negative for acute change except as noted in the HPI    Physical Exam Updated Vital Signs BP 132/62   Pulse 119   Temp 101.7 F (38.7 C) (Rectal)   Resp (!) 30   Ht 5\' 5"  (1.651 m)   Wt 115 lb (52.2 kg)   SpO2 97%   BMI 19.14 kg/m   Physical Exam  Constitutional: She is oriented to person, place, and time. She appears well-developed and well-nourished. No distress.  HENT:  Head: Normocephalic and atraumatic.  Nose: Nose normal.  Eyes: Conjunctivae and EOM are normal. Pupils are equal, round, and reactive to light. Right eye exhibits no discharge. Left eye exhibits no discharge. No scleral icterus.  Neck: Normal range of motion. Neck supple.  Cardiovascular: Normal rate and regular rhythm.  Exam reveals no gallop and no friction rub.   No murmur heard. Pulmonary/Chest: No stridor. Tachypnea noted. She is in respiratory distress. She has wheezes. She has rhonchi. She has rales.  Abdominal: Soft. She exhibits no distension. There is no  tenderness.  Musculoskeletal: She exhibits no edema or tenderness.  Neurological: She is alert and oriented to person, place, and time.  Skin: Skin is warm and dry. No rash noted. She is not diaphoretic. No erythema.  Psychiatric: She has a normal mood and affect.  Vitals reviewed.    ED Treatments / Results  Labs (  all labs ordered are listed, but only abnormal results are displayed) Labs Reviewed  CBC - Abnormal; Notable for the following:       Result Value   WBC 17.0 (*)    Hemoglobin 10.8 (*)    MCH 24.3 (*)    MCHC 29.1 (*)    RDW 17.3 (*)    All other components within normal limits  COMPREHENSIVE METABOLIC PANEL - Abnormal; Notable for the following:    Chloride 97 (*)    Glucose, Bld 106 (*)    All other components within normal limits  I-STAT ARTERIAL BLOOD GAS, ED - Abnormal; Notable for the following:    pH, Arterial 7.333 (*)    pCO2 arterial 68.7 (*)    Bicarbonate 36.5 (*)    Acid-Base Excess 8.0 (*)    All other components within normal limits  CULTURE, BLOOD (ROUTINE X 2)  CULTURE, BLOOD (ROUTINE X 2)  URINE CULTURE  BRAIN NATRIURETIC PEPTIDE  BLOOD GAS, ARTERIAL  URINALYSIS, ROUTINE W REFLEX MICROSCOPIC (NOT AT [facility])  BLOOD GAS, ARTERIAL  I-STAT TROPOININ, ED  I-STAT CG4 LACTIC ACID, ED    EKG  EKG Interpretation  Date/Time:  Monday November 24 2015 23:28:05 EDT Ventricular Rate:  115 PR Interval:    QRS Duration: 70 QT Interval:  368 QTC Calculation: 509 R Axis:   28 Text Interpretation:  Sinus tachycardia Low voltage, extremity leads Prolonged QT interval No significant change since last tracing Confirmed by [facility] MD, Frimy Uffelman ([id number]) on 11/24/2015 11:43:43 PM       Radiology Dg Chest Portable 1 View  Result Date: 11/24/2015 CLINICAL DATA:  Shortness of Breath EXAM: PORTABLE CHEST 1 VIEW COMPARISON:  October 17, 2015 FINDINGS: There is airspace consolidation consistent with pneumonia in the left lower lobe region. The lungs elsewhere  clear. Heart size and pulmonary vascularity are normal. No adenopathy. There is atherosclerotic calcification in the aorta. There is calcification in the left carotid artery. No bone lesions evident. IMPRESSION: Left lower lobe airspace consolidation consistent with pneumonia. Aortic atherosclerosis. There is calcification in the left carotid artery as well. Followup PA and lateral chest radiographs recommended in 3-4 weeks following trial of antibiotic therapy to ensure resolution and exclude underlying malignancy. Electronically Signed   By: Bretta Bang III M.D.   On: 11/24/2015 21:47    Procedures Procedures (including critical care time) CRITICAL CARE Performed by: Amadeo Garnet Shenetta Schnackenberg Total critical care time: 30 minutes Critical care time was exclusive of separately billable procedures and treating other patients. Critical care was necessary to treat or prevent imminent or life-threatening deterioration. Critical care was time spent personally by me on the following activities: development of treatment plan with patient and/or surrogate as well as nursing, discussions with consultants, evaluation of patient's response to treatment, examination of patient, obtaining history from patient or surrogate, ordering and performing treatments and interventions, ordering and review of laboratory studies, ordering and review of radiographic studies, pulse oximetry and re-evaluation of patient's condition.   Medications Ordered in ED Medications  albuterol (PROVENTIL,VENTOLIN) solution continuous neb (10 mg/hr Nebulization New Bag/Given 11/24/15 2145)  piperacillin-tazobactam (ZOSYN) IVPB 3.375 g (not administered)  vancomycin (VANCOCIN) IVPB 750 mg/150 ml premix (not administered)  piperacillin-tazobactam (ZOSYN) IVPB 3.375 g (0 g Intravenous Stopped 11/24/15 2250)  vancomycin (VANCOCIN) IVPB 1000 mg/200 mL premix (0 mg Intravenous Stopped 11/24/15 2320)  ipratropium-albuterol (DUONEB) 0.5-2.5  (3) MG/3ML nebulizer solution 6 mL (6 mLs Nebulization Given 11/24/15 2306)     Initial Impression /  Assessment and Plan / ED Course  I have reviewed the triage vital signs and the nursing notes.  Pertinent labs & imaging results that were available during my care of the patient were reviewed by me and considered in my medical decision making (see chart for details).  Clinical Course    COPD exacerbation secondary to pneumonia. Patient given empiric antibiotics. Lactic acid within normal limits. Patient not hypertensive, does not requiring multiple IV fluids. Placed on BiPAP on arrival in provided with continuous DuoNeb's. Gas with compensated respiratory acidosis.   Trial off of Bipap was unsuccessful. Placed back on Bipap.  Admitted to hospitalist service for continued management.  Final Clinical Impressions(s) / ED Diagnoses   Final diagnoses:  COPD exacerbation (HCC)  Healthcare-associated pneumonia    Disposition: Admit  Condition: serious      Nira ConnPedro Eduardo Meshulem Onorato, MD 11/25/15 [phone]

## 2015-11-24 NOTE — ED Notes (Signed)
Notified EDP of patient's rectal temperature. Calling a code sepsis Medical laboratory scientific officernotified secretary.

## 2015-11-24 NOTE — ED Notes (Signed)
EDP ordered not to give IV fluids at this time.

## 2015-11-25 DIAGNOSIS — E119 Type 2 diabetes mellitus without complications: Secondary | ICD-10-CM

## 2015-11-25 DIAGNOSIS — I5032 Chronic diastolic (congestive) heart failure: Secondary | ICD-10-CM

## 2015-11-25 DIAGNOSIS — J181 Lobar pneumonia, unspecified organism: Secondary | ICD-10-CM

## 2015-11-25 DIAGNOSIS — J9601 Acute respiratory failure with hypoxia: Secondary | ICD-10-CM

## 2015-11-25 DIAGNOSIS — A419 Sepsis, unspecified organism: Secondary | ICD-10-CM

## 2015-11-25 DIAGNOSIS — J9602 Acute respiratory failure with hypercapnia: Secondary | ICD-10-CM

## 2015-11-25 DIAGNOSIS — J9690 Respiratory failure, unspecified, unspecified whether with hypoxia or hypercapnia: Secondary | ICD-10-CM | POA: Diagnosis present

## 2015-11-25 LAB — URINALYSIS, ROUTINE W REFLEX MICROSCOPIC
Glucose, UA: NEGATIVE mg/dL
HGB URINE DIPSTICK: NEGATIVE
KETONES UR: 15 mg/dL — AB
Leukocytes, UA: NEGATIVE
NITRITE: NEGATIVE
PH: 6 (ref 5.0–8.0)
Protein, ur: 100 mg/dL — AB
Specific Gravity, Urine: 1.022 (ref 1.005–1.030)

## 2015-11-25 LAB — CBC
HEMATOCRIT: 34.6 % — AB (ref 36.0–46.0)
Hemoglobin: 10.1 g/dL — ABNORMAL LOW (ref 12.0–15.0)
MCH: 24.3 pg — AB (ref 26.0–34.0)
MCHC: 29.2 g/dL — ABNORMAL LOW (ref 30.0–36.0)
MCV: 83.2 fL (ref 78.0–100.0)
PLATELETS: 336 10*3/uL (ref 150–400)
RBC: 4.16 MIL/uL (ref 3.87–5.11)
RDW: 17.3 % — ABNORMAL HIGH (ref 11.5–15.5)
WBC: 21.7 10*3/uL — ABNORMAL HIGH (ref 4.0–10.5)

## 2015-11-25 LAB — I-STAT ARTERIAL BLOOD GAS, ED
ACID-BASE EXCESS: 7 mmol/L — AB (ref 0.0–2.0)
Acid-Base Excess: 6 mmol/L — ABNORMAL HIGH (ref 0.0–2.0)
Acid-Base Excess: 7 mmol/L — ABNORMAL HIGH (ref 0.0–2.0)
BICARBONATE: 33.2 mmol/L — AB (ref 20.0–28.0)
BICARBONATE: 33.7 mmol/L — AB (ref 20.0–28.0)
BICARBONATE: 36.1 mmol/L — AB (ref 20.0–28.0)
O2 SAT: 95 %
O2 Saturation: 88 %
O2 Saturation: 92 %
PH ART: 7.321 — AB (ref 7.350–7.450)
PH ART: 7.348 — AB (ref 7.350–7.450)
PO2 ART: 73 mmHg — AB (ref 83.0–108.0)
PO2 ART: 77 mmHg — AB (ref 83.0–108.0)
PO2 ART: 83 mmHg (ref 83.0–108.0)
TCO2: 35 mmol/L (ref 0–100)
TCO2: 36 mmol/L (ref 0–100)
TCO2: 38 mmol/L (ref 0–100)
pCO2 arterial: 61.3 mmHg — ABNORMAL HIGH (ref 32.0–48.0)
pCO2 arterial: 65.5 mmHg (ref 32.0–48.0)
pCO2 arterial: 82 mmHg (ref 32.0–48.0)
pH, Arterial: 7.261 — ABNORMAL LOW (ref 7.350–7.450)

## 2015-11-25 LAB — BASIC METABOLIC PANEL
Anion gap: 10 (ref 5–15)
BUN: 27 mg/dL — AB (ref 6–20)
CHLORIDE: 98 mmol/L — AB (ref 101–111)
CO2: 32 mmol/L (ref 22–32)
Calcium: 9.3 mg/dL (ref 8.9–10.3)
Creatinine, Ser: 1.16 mg/dL — ABNORMAL HIGH (ref 0.44–1.00)
GFR calc Af Amer: 54 mL/min — ABNORMAL LOW (ref >60)
GFR calc non Af Amer: 46 mL/min — ABNORMAL LOW (ref >60)
GLUCOSE: 182 mg/dL — AB (ref 65–99)
POTASSIUM: 4.8 mmol/L (ref 3.5–5.1)
Sodium: 140 mmol/L (ref 135–145)

## 2015-11-25 LAB — GLUCOSE, CAPILLARY
GLUCOSE-CAPILLARY: 112 mg/dL — AB (ref 65–99)
GLUCOSE-CAPILLARY: 127 mg/dL — AB (ref 65–99)
GLUCOSE-CAPILLARY: 129 mg/dL — AB (ref 65–99)

## 2015-11-25 LAB — CBG MONITORING, ED
Glucose-Capillary: 169 mg/dL — ABNORMAL HIGH (ref 65–99)
Glucose-Capillary: 108 mg/dL — ABNORMAL HIGH (ref 65–99)
Glucose-Capillary: 134 mg/dL — ABNORMAL HIGH (ref 65–99)

## 2015-11-25 LAB — PROTIME-INR
INR: 0.97
PROTHROMBIN TIME: 12.9 s (ref 11.4–15.2)

## 2015-11-25 LAB — URINE MICROSCOPIC-ADD ON

## 2015-11-25 LAB — EXPECTORATED SPUTUM ASSESSMENT W REFEX TO RESP CULTURE

## 2015-11-25 LAB — I-STAT CG4 LACTIC ACID, ED: Lactic Acid, Venous: 1.66 mmol/L (ref 0.5–1.9)

## 2015-11-25 LAB — APTT: aPTT: 27 seconds (ref 24–36)

## 2015-11-25 LAB — MRSA PCR SCREENING: MRSA BY PCR: NEGATIVE

## 2015-11-25 LAB — EXPECTORATED SPUTUM ASSESSMENT W GRAM STAIN, RFLX TO RESP C

## 2015-11-25 LAB — INFLUENZA PANEL BY PCR (TYPE A & B)
INFLAPCR: NEGATIVE
Influenza B By PCR: NEGATIVE

## 2015-11-25 LAB — STREP PNEUMONIAE URINARY ANTIGEN: STREP PNEUMO URINARY ANTIGEN: NEGATIVE

## 2015-11-25 LAB — LACTIC ACID, PLASMA: LACTIC ACID, VENOUS: 1.7 mmol/L (ref 0.5–1.9)

## 2015-11-25 LAB — PROCALCITONIN: PROCALCITONIN: 11.09 ng/mL

## 2015-11-25 MED ORDER — ALBUTEROL SULFATE (2.5 MG/3ML) 0.083% IN NEBU: 2.5000 mg | INHALATION_SOLUTION | RESPIRATORY_TRACT | Status: DC | PRN

## 2015-11-25 MED ORDER — METHYLPREDNISOLONE SODIUM SUCC 125 MG IJ SOLR: 60.0000 mg | Freq: Four times a day (QID) | INTRAMUSCULAR | Status: DC

## 2015-11-25 MED ORDER — LATANOPROST 0.005 % OP SOLN
1.0000 [drp] | Freq: Every day | OPHTHALMIC | Status: DC
  Administered 2015-11-26 – 2015-11-29 (×5): 1 [drp] via OPHTHALMIC
  Filled 2015-11-25 (×3): qty 2.5

## 2015-11-25 MED ORDER — METHYLPREDNISOLONE SODIUM SUCC 40 MG IJ SOLR
40.0000 mg | Freq: Four times a day (QID) | INTRAMUSCULAR | Status: DC
  Administered 2015-11-25 – 2015-11-27 (×9): 40 mg via INTRAVENOUS
  Filled 2015-11-25 (×9): qty 1

## 2015-11-25 MED ORDER — INSULIN ASPART 100 UNIT/ML ~~LOC~~ SOLN
0.0000 [IU] | SUBCUTANEOUS | Status: DC
  Administered 2015-11-25: 2 [IU] via SUBCUTANEOUS
  Administered 2015-11-25: 1 [IU] via SUBCUTANEOUS
  Administered 2015-11-26: 2 [IU] via SUBCUTANEOUS
  Administered 2015-11-26: 1 [IU] via SUBCUTANEOUS
  Administered 2015-11-26 (×2): 2 [IU] via SUBCUTANEOUS
  Administered 2015-11-26: 1 [IU] via SUBCUTANEOUS
  Administered 2015-11-27 (×2): 2 [IU] via SUBCUTANEOUS
  Administered 2015-11-27: 1 [IU] via SUBCUTANEOUS
  Administered 2015-11-27: 2 [IU] via SUBCUTANEOUS
  Administered 2015-11-27: 1 [IU] via SUBCUTANEOUS
  Administered 2015-11-27: 2 [IU] via SUBCUTANEOUS
  Administered 2015-11-28 (×2): 1 [IU] via SUBCUTANEOUS
  Administered 2015-11-28: 2 [IU] via SUBCUTANEOUS
  Administered 2015-11-28: 1 [IU] via SUBCUTANEOUS
  Administered 2015-11-28: 2 [IU] via SUBCUTANEOUS
  Administered 2015-11-29: 5 [IU] via SUBCUTANEOUS
  Administered 2015-11-29: 2 [IU] via SUBCUTANEOUS
  Filled 2015-11-25 (×2): qty 1

## 2015-11-25 MED ORDER — IPRATROPIUM-ALBUTEROL 0.5-2.5 (3) MG/3ML IN SOLN: 3.0000 mL | RESPIRATORY_TRACT | Status: DC

## 2015-11-25 MED ORDER — CHLORHEXIDINE GLUCONATE 0.12 % MT SOLN
15.0000 mL | Freq: Two times a day (BID) | OROMUCOSAL | Status: DC
  Administered 2015-11-25 – 2015-11-30 (×9): 15 mL via OROMUCOSAL
  Filled 2015-11-25 (×8): qty 15

## 2015-11-25 MED ORDER — SODIUM CHLORIDE 0.9 % IV SOLN
INTRAVENOUS | Status: DC
  Administered 2015-11-25: 09:00:00 via INTRAVENOUS

## 2015-11-25 MED ORDER — IPRATROPIUM-ALBUTEROL 0.5-2.5 (3) MG/3ML IN SOLN
3.0000 mL | Freq: Four times a day (QID) | RESPIRATORY_TRACT | Status: DC
  Administered 2015-11-25 (×3): 3 mL via RESPIRATORY_TRACT
  Filled 2015-11-25 (×3): qty 3

## 2015-11-25 MED ORDER — ENOXAPARIN SODIUM 40 MG/0.4ML ~~LOC~~ SOLN
40.0000 mg | Freq: Every day | SUBCUTANEOUS | Status: DC
  Administered 2015-11-25 – 2015-11-29 (×5): 40 mg via SUBCUTANEOUS
  Filled 2015-11-25 (×7): qty 0.4

## 2015-11-25 NOTE — H&P (Signed)
History and Physical    Nicole Maxwell ZOX:[id number] DOB: [birth year] DOA: 11/24/2015  PCP: [name], William, MD  [facility]  Patient coming from: Home  Chief Complaint: Found down by family members.  O2 sats in the low 80's.  HPI: Nicole Maxwell is a 70 y.o. woman with a history of COPD with chronic respiratory failure on O2 2L [place] at baseline, chronic diastolic heart failure, HTN, and DM who lives independently and continues to smoke.  She has a daughter who is her next of kin/POA and speaks to her multiple times daily.  The patient is lethargic and unable to give any meaningful history (though she is oriented to person, place, and year).  History taken from EMR, ED report, and conversation with her daughter by phone.  Her daughter reports that the patient has been complaining of not feeling well for 3-4 days.  She has had a cough productive of sputum that has been thick and clear or yellow.  She had not reported fever prior to today, but she had been complaining for "going from hot to cold".  She has had decreased PO intake in the past two days.  She has had chest tightness, increased wheezing, and progressive shortness of breath.  The patient's daughter asked another family member to go by and check on the patient today.  She was found down without her oxygen on.  Portable pulse ox reportedly showed O2 sats in the low 80's.  911 was called immediately.  The patient received two breathing treatments en route to the ED.  Upon arrival here, she was placed on BiPAP with ABG showing pH 7.33, pCO2 69, pO2 87.  ED Course: CODE Sepsis called.  Fever to 101.7, WBC count 17, HR 119, RR 30.  Normal lactic acid.  She has not been hypotensive.  The patient did not receive aggressive volume resuscitation.  She received IV vanc and zosyn.  Urine studies pending.  Blood cultures have been drawn.  After two hours on BiPAP, the patient was put back on 2L [place] which is her baseline.  Unfortunately, ABG is worse with pH  7.261, pCO2 82, pO2 73.  Hospitalist asked to admit, but I have consulted PCCM because I believe the patient will need intubation.  Code status discussed with patient's daughter by phone.  Patient is currently full code.  Review of Systems: Limited by patient's lethargy/mental status changes.   Past Medical History:  Diagnosis Date  . Alcohol abuse, in remission quit 2004  . Anemia of chronic disease   . Anxiety    Xanax prn  . Arthritis    "my whole body"  . Asthma   . Chronic back pain    "goes from the lower part of my back on down my legs"  (04/17/2014)  . Chronic respiratory failure with hypercapnia (HCC)   . COPD (chronic obstructive pulmonary disease) (HCC)    cxr 04/03/2010 - hyperinflation  . COPD exacerbation (HCC)   . Dysphagia   . Esophageal dysmotility   . HCAP (healthcare-associated pneumonia)   . Hyperlipidemia   . Hypertension   . Leukocytosis   . Neuromuscular disorder (HCC)    lumbar disc  . On home oxygen therapy    "2L; suppose to be 24/7" (04/17/2014)  . Osteopenia   . Physical deconditioning   . Pneumonia    "3 times in the last month or so" (04/17/2014)  . Protein calorie malnutrition (HCC)   . PVD (peripheral vascular disease) (HCC)   .  Shortness of breath    with anxiety  and activty  . Tobacco abuse    " I am addicted"   . Type II diabetes mellitus (HCC)   . Vitamin D deficiency disease    03/26/2010 - 9.9ng/mL. Started on replacemnt    Past Surgical History:  Procedure Laterality Date  . ABDOMINAL HYSTERECTOMY     "partial"  . APPENDECTOMY  1960  . BACK SURGERY    . CARPAL TUNNEL RELEASE Bilateral    CTS repair 2000 (R), 2006 (L)/notes 01/01/2014  . CATARACT EXTRACTION W/ INTRAOCULAR LENS  IMPLANT, BILATERAL Bilateral 2015  . LUMBAR LAMINECTOMY/DECOMPRESSION MICRODISCECTOMY  2005   L4-5/notes 03/06/2010  . POSTERIOR LUMBAR FUSION  2002  . TONSILLECTOMY  1960     reports that she has been smoking Cigarettes.  She has a 14.00 pack-year  smoking history. She has never used smokeless tobacco. She reports that she drinks alcohol. She reports that she does not use drugs. She is a widow.  She has two adult daughters.  She is smoking 1-2 cigarettes daily.  No EtOH or illicit drug use.  Allergies  Allergen Reactions  . Brovana [Arformoterol Tartrate] Shortness Of Breath and Cough    General intolerance  . Budesonide Shortness Of Breath and Cough    General intolerance  . Mucomyst [Acetylcysteine] Shortness Of Breath    Family History  Problem Relation Age of Onset  . Heart attack Sister 70    CABG  . Breast cancer Sister   . Cancer Brother   . Heart attack Brother   . Hypertension Other     all siblings  . Anesthesia problems Neg Hx      Prior to Admission medications   Medication Sig Start Date End Date Taking? Authorizing Provider  acetaminophen (TYLENOL) 650 MG CR tablet Take 1,300 mg by mouth every 8 (eight) hours as needed for pain.   Yes Historical Provider, MD  albuterol (PROAIR HFA) 108 (90 Base) MCG/ACT inhaler Inhale 2 puffs into the lungs every 6 (six) hours.   Yes Historical Provider, MD  albuterol (PROVENTIL) (2.5 MG/3ML) 0.083% nebulizer solution Take 3 mLs (2.5 mg total) by nebulization every 4 (four) hours as needed for wheezing or shortness of breath. (may take 1 every 4 hours as needed for wheezing/shortness of breath) DX: 496 01/09/14  Yes [name], MD  alprazolam Prudy Feeler) 2 MG tablet Take 1-2 mg by mouth See admin instructions. Take 1/2 tablet (1 mg) by mouth during the day and 1 tablet (2 mg) at bedtime 06/21/15  Yes Historical Provider, MD  amLODipine (NORVASC) 10 MG tablet Take 1 tablet (10 mg total) by mouth daily. 10/25/15  Yes Jeralyn Bennett, MD  aspirin EC 81 MG EC tablet Take 1 tablet (81 mg total) by mouth daily. 01/09/14  Yes [name], MD  atorvastatin (LIPITOR) 20 MG tablet Take 20 mg by mouth daily.  09/14/14  Yes Historical Provider, MD  DULoxetine (CYMBALTA) 60 MG capsule Take 60 mg  by mouth daily. Reported on 08/01/2015   Yes Historical Provider, MD  feeding supplement, GLUCERNA SHAKE, (GLUCERNA SHAKE) LIQD Take 237 mLs by mouth daily.    Yes Historical Provider, MD  fluticasone (FLONASE) 50 MCG/ACT nasal spray Place 1 spray into both nostrils 2 (two) times daily. Patient taking differently: Place 2 sprays into both nostrils daily.  12/23/14  Yes Sharee Holster, NP  furosemide (LASIX) 20 MG tablet Take 20 mg by mouth daily.    Yes Historical Provider, MD  HYDROcodone-acetaminophen (NORCO/VICODIN) 5-325 MG tablet Take 1 tablet by mouth every 6 (six) hours as needed for moderate pain.   Yes Historical Provider, MD  ipratropium-albuterol (DUONEB) 0.5-2.5 (3) MG/3ML SOLN Inhale 3 mLs into the lungs every 6 (six) hours as needed.  10/02/15  Yes Historical Provider, MD  latanoprost (XALATAN) 0.005 % ophthalmic solution Place 1 drop into both eyes at bedtime. 10/15/15  Yes Historical Provider, MD  losartan (COZAAR) 25 MG tablet Take 25 mg by mouth daily. 10/01/15  Yes Historical Provider, MD  metFORMIN (GLUCOPHAGE) 500 MG tablet TAKE 1 TABLET BY MOUTH TWICE DAILY 01/07/15  Yes Sharee Holstereborah S Green, NP  Multiple Vitamin (MULTIVITAMIN WITH MINERALS) TABS tablet Take 1 tablet by mouth daily.   Yes Historical Provider, MD  OXYGEN Inhale 2 L into the lungs continuous.   Yes Historical Provider, MD  pantoprazole (PROTONIX) 40 MG tablet TAKE 1 TABLET BY MOUTH EVERY NIGHT Patient taking differently: TAKE 1 TABLET BY MOUTH EVERY MORNING 01/07/15  Yes Sharee Holstereborah S Green, NP  polyethylene glycol (MIRALAX / GLYCOLAX) packet Take 17 g by mouth daily as needed for mild constipation. 10/24/15  Yes Jeralyn BennettEzequiel Zamora, MD  potassium chloride SA (K-DUR,KLOR-CON) 20 MEQ tablet Take 1 tablet (20 mEq total) by mouth daily. Take while on lasix 12/12/14  Yes Jeanella CrazeBrandi L Ollis, NP  predniSONE (DELTASONE) 5 MG tablet Take 5 mg by mouth daily with breakfast.   Yes Historical Provider, MD  PRESCRIPTION MEDICATION Inhale into the  lungs continuous. CPAP   Yes Historical Provider, MD  Tetrahydroz-Polyvinyl Al-Povid (CLEAR EYES TRIPLE ACTION) 0.05-0.5-0.6 % SOLN Place 1 drop into both eyes 3 (three) times daily as needed (dry eyes).   Yes Historical Provider, MD    Physical Exam: Vitals:   11/24/15 2137 11/24/15 2141 11/24/15 2147 11/24/15 2307  BP:      Pulse:      Resp:      Temp: 101.7 F (38.7 C)     TempSrc: Rectal     SpO2:  100% 100% 97%  Weight:      Height:          Constitutional: Lethargic, ill appearing but not acutely decompensating Vitals:   11/24/15 2137 11/24/15 2141 11/24/15 2147 11/24/15 2307  BP:      Pulse:      Resp:      Temp: 101.7 F (38.7 C)     TempSrc: Rectal     SpO2:  100% 100% 97%  Weight:      Height:       Eyes: PERRL, lids and conjunctivae normal ENMT: Mucous membranes are moist. Posterior pharynx clear of any exudate or lesions. Normal dentition.  Neck: normal appearance, supple Respiratory: Diminished bilaterally.  I do not hear significant wheezing.  She is tachypneic with shallow breaths.  No accessory muscle use.  Cardiovascular: Tachycardic but regular.  No lower extremity edema. 2+ pedal pulses. GI: abdomen is soft and compressible.  No distention.  No tenderness.  Bowel sounds are present. Musculoskeletal:  No joint deformity in upper and lower extremities. Good ROM, no contractures. Normal muscle tone.  Skin: no rashes, warm and dry Neurologic: No focal deficits.  Moves all four extremities spontaneously. Psychiatric: Impaired judgment/insight due to lethargy/mental status changes at this time though she knows [facility] and the year is 2017.    Labs on Admission: I have personally reviewed following labs and imaging studies  CBC:  Recent Labs Lab 11/24/15 2145  WBC 17.0*  HGB 10.8*  HCT 37.1  MCV 83.6  PLT 369   Basic Metabolic Panel:  Recent Labs Lab 11/24/15 2145  NA 139  K 4.6  CL 97*  CO2 29  GLUCOSE 106*  BUN 19    CREATININE 0.88  CALCIUM 9.4   GFR: Estimated Creatinine Clearance: 48.3 mL/min (by C-G formula based on SCr of 0.88 mg/dL). Liver Function Tests:  Recent Labs Lab 11/24/15 2145  AST 22  ALT 16  ALKPHOS 63  BILITOT 0.8  PROT 6.6  ALBUMIN 3.7   TROPONIN: 0.02  Urine analysis: Pending  Sepsis Labs:  Lactic acid level 1.4  Radiological Exams on Admission: Dg Chest Portable 1 View  Result Date: 11/24/2015 CLINICAL DATA:  Shortness of Breath EXAM: PORTABLE CHEST 1 VIEW COMPARISON:  October 17, 2015 FINDINGS: There is airspace consolidation consistent with pneumonia in the left lower lobe region. The lungs elsewhere clear. Heart size and pulmonary vascularity are normal. No adenopathy. There is atherosclerotic calcification in the aorta. There is calcification in the left carotid artery. No bone lesions evident. IMPRESSION: Left lower lobe airspace consolidation consistent with pneumonia. Aortic atherosclerosis. There is calcification in the left carotid artery as well. Followup PA and lateral chest radiographs recommended in 3-4 weeks following trial of antibiotic therapy to ensure resolution and exclude underlying malignancy. Electronically Signed   By: Bretta Bang III M.D.   On: 11/24/2015 21:47    EKG: Independently reviewed. Sinus tachycardia.  No acute ST segment changes.  Assessment/Plan Principal Problem:   Pneumonia Active Problems:   Severe chronic obstructive pulmonary disease (HCC)   Diabetes mellitus type 2, noninsulin dependent (HCC)   Acute on chronic respiratory failure (HCC)   Chronic diastolic congestive heart failure (HCC)   Acute respiratory failure with hypoxia and hypercarbia (HCC)   Tobacco use   Sepsis (HCC)   Respiratory failure (HCC)      Acute on chronic respiratory failure with hypoxia and hypercarbia secondary to LLL pneumonia, COPD exacerbation, Sepsis without shock --PCCM consulted to assess for intubation, currently back on  continuous BiPAP support --Continue empiric vanc and zosyn --Blood cultures pending --U/A and urine culture requested --Sputum culture requested if/when patient can produce sample --Influenza screen added per PCCM recs --Check urine legionella and streptococcal antigens --Solumedrol 60mg  IV q6h --Duoneb q4h --NS at 75 cc/hr for now --Repeat lactic acid level, check procalcitonin --NPO status  History of DM --NPO due to respiratory failure --Blood glucose checks q4h --Sensitive sliding scale insulin coverage if needed --Holding metformin  Chronic diastolic HF, compensated at this time --Monitor volume status --I think gentle hydration warranted, at least short term, in the setting of fever and sepsis   DVT prophylaxis: Lovenox Code Status: FULL Family Communication: Patient alone in the ED.  I spoke to her daughter, who is next of kin and POA, by phone at number found in the EMR. Disposition Plan: To be determined. Consults called: PCCM Admission status: Inpatient, stepdown unit.  Will need to transfer to ICU if she is intubated.   TIME SPENT: 70 minutes   Jerene Bears MD [facility] Pager [phone]  If 7PM-7AM, please contact night-coverage [email or web address] Password [email or web address]  11/25/2015, 12:51 AM

## 2015-11-25 NOTE — ED Notes (Signed)
Phlebotomy at the bedside  

## 2015-11-25 NOTE — ED Notes (Signed)
Pt given comb. Waiting for bed placement. Comfortable. Denies any needs at this time. Remains on the cardiac monitor.

## 2015-11-25 NOTE — ED Notes (Signed)
Attempted Report x1.   

## 2015-11-25 NOTE — Progress Notes (Signed)
ABG collected  

## 2015-11-25 NOTE — ED Notes (Signed)
Pt being taken upstairs at this time  

## 2015-11-25 NOTE — Progress Notes (Signed)
Patient admitted to 3S14 with ED RN on monitor. Patient awake, oriented to self, place and time, disoriented to situation. ED RN reports that she attempted a swallow screen and the patient's lungs sounds changed so she stopped the screening, notified the MD who requested a speech consult. Will keep paitent NPO for now. Vital signs stable, will continue to monitor. Oriented to unit, call bell, son in law at bedside.

## 2015-11-25 NOTE — Progress Notes (Signed)
Pt placed back on NIV at this time. Per ABG results.

## 2015-11-25 NOTE — Progress Notes (Signed)
Mask changed to small

## 2015-11-25 NOTE — ED Notes (Signed)
Prior to in and out patient cleaned up of dried stool.  In and out cath done to obtain urine and culture.  Procedure done per policy.  Amber strong smelling urine obtained.

## 2015-11-25 NOTE — Progress Notes (Signed)
eLink Physician-Brief Progress Note Patient Name: Nicole Maxwell DOB: 1945-10-14 MRN: [id number]   Date of Service  11/25/2015  HPI/Events of Note  I extensively discussed with patient's daughter, Nicole Maxwell, regarding patient's condition.  Patient has end-stage COPD, on chronic oxygen, who continues to smoke cigarettes, not really compliant with BiPAP, comes in with acute exacerbation of COPD, possible left lower lobe pneumonia. (HCAP).  She has removed BiPAP a couple times at the emergency room.  She is relatively stable on BiPAP. ABGs worse off BiPAP so she has been placed back on BiPAP.   Patient did not want to be intubated.   Daughter agrees with making patient DO NOT INTUBATE.  Daughter says patient wants CPR and acls.   eICU Interventions  Will make pt DO NOT INTUBATE.  May do CPR and ACLS for now.  Suggest to rediscuss ACLS and CPR in the morning once daughter arrives.         Fernande Treiber [address]Angelo A De Dios 11/25/2015, 1:59 AM

## 2015-11-25 NOTE — Progress Notes (Signed)
Patient seen and examined  70 year old female with past medical history as below, which is significant for COPD (on home O2, pred dependent,  followed by Dr. Marchelle Gearingamaswamy), current smoker, esophageal dysmotility, hypertension, and diabetes. She has been admitted 3 times in the past 6 months with COPD exacerbations and questionable pneumonias. She again presents to the emergency department 10/30 after being found down at home by family members. EMS was called and found her O2 saturations to be in the low 80s.PCCM contacted for consultation. Patient's daughter  Nicole Maxwell,  realizes that patient has  end-stage COPD, on chronic oxygen, who continues to smoke cigarettes, not really compliant with BiPAP,  She has removed BiPAP a couple times at the emergency room. , Currently on 3 L nasal cannula. She has decided to be a DO NOT INTUBATE Perform CPR and ACLS if needed  Awaiting stepdown bed, continue current rx

## 2015-11-25 NOTE — ED Notes (Signed)
Spoke with Renae FicklePaul, [place] and patient is a DNI  Limited code

## 2015-11-25 NOTE — ED Notes (Signed)
Pt coughing and performed Incentive Spirometry

## 2015-11-25 NOTE — Consult Note (Signed)
PULMONARY / CRITICAL CARE MEDICINE   Name: Nicole Maxwell MRN: [id number] DOB: [birth year]    ADMISSION DATE:  11/24/2015 CONSULTATION DATE:  11/25/2015  REFERRING MD:  Dr. Montez Morita TR[facility]  CHIEF COMPLAINT:  Altered mental status  HISTORY OF PRESENT ILLNESS: Patient is drowsy with some delirium. Therefore history has been obtained mostly from chart review. 70 year old female with past medical history as below, which is significant for COPD (on home O2, pred dependent,  followed by Dr. Marchelle Gearing), current smoker, esophageal dysmotility, hypertension, and diabetes. She has been admitted 3 times in the past 6 months with COPD exacerbations and questionable pneumonias. She again presents to the emergency department 10/30 after being found down at home by family members. EMS was called and found her O2 saturations to be in the low 80s. Upon arrival to the emergency department she was placed on BiPAP, initial ABG looked okay. There was concern for sepsis as she was febrile with elevated WBC 17. Chest x-ray showed a left basilar opacification concerning for pneumonia. She was started on broad-spectrum antibiotics as she has been recently hospitalized. After some time on BiPAP she became more lethargic and ABG was assessed which was slightly worse. PCCM contacted for consultation.  Of note admitting team spoke with the patient's daughter on the telephone. She reports that her mother had not been feeling well for about 3-4 days. She had been complaining of subjective fevers/chills and has had productive cough with progressive shortness of breath.   PAST MEDICAL HISTORY :  She  has a past medical history of Alcohol abuse, in remission (quit 2004); Anemia of chronic disease; Anxiety; Arthritis; Asthma; Chronic back pain; Chronic respiratory failure with hypercapnia (HCC); COPD (chronic obstructive pulmonary disease) (HCC); COPD exacerbation (HCC); Dysphagia; Esophageal dysmotility; HCAP (healthcare-associated  pneumonia); Hyperlipidemia; Hypertension; Leukocytosis; Neuromuscular disorder (HCC); On home oxygen therapy; Osteopenia; Physical deconditioning; Pneumonia; Protein calorie malnutrition (HCC); PVD (peripheral vascular disease) (HCC); Shortness of breath; Tobacco abuse; Type II diabetes mellitus (HCC); and Vitamin D deficiency disease.  PAST SURGICAL HISTORY: She  has a past surgical history that includes Lumbar laminectomy/decompression microdiscectomy (2005); Appendectomy (1960); Tonsillectomy (1960); Posterior lumbar fusion (2002); Abdominal hysterectomy; Back surgery; Carpal tunnel release (Bilateral); and Cataract extraction w/ intraocular lens  implant, bilateral (Bilateral, 2015).  Allergies  Allergen Reactions  . Brovana [Arformoterol Tartrate] Shortness Of Breath and Cough    General intolerance  . Budesonide Shortness Of Breath and Cough    General intolerance  . Mucomyst [Acetylcysteine] Shortness Of Breath    No current facility-administered medications on file prior to encounter.    Current Outpatient Prescriptions on File Prior to Encounter  Medication Sig  . acetaminophen (TYLENOL) 650 MG CR tablet Take 1,300 mg by mouth every 8 (eight) hours as needed for pain.  Marland Kitchen albuterol (PROAIR HFA) 108 (90 Base) MCG/ACT inhaler Inhale 2 puffs into the lungs every 6 (six) hours.  Marland Kitchen albuterol (PROVENTIL) (2.5 MG/3ML) 0.083% nebulizer solution Take 3 mLs (2.5 mg total) by nebulization every 4 (four) hours as needed for wheezing or shortness of breath. (may take 1 every 4 hours as needed for wheezing/shortness of breath) DX: 496  . alprazolam (XANAX) 2 MG tablet Take 1-2 mg by mouth See admin instructions. Take 1/2 tablet (1 mg) by mouth during the day and 1 tablet (2 mg) at bedtime  . amLODipine (NORVASC) 10 MG tablet Take 1 tablet (10 mg total) by mouth daily.  Marland Kitchen aspirin EC 81 MG EC tablet Take 1 tablet (81 mg total)  by mouth daily.  Marland Kitchen. atorvastatin (LIPITOR) 20 MG tablet Take 20 mg by mouth  daily.   . DULoxetine (CYMBALTA) 60 MG capsule Take 60 mg by mouth daily. Reported on 08/01/2015  . feeding supplement, GLUCERNA SHAKE, (GLUCERNA SHAKE) LIQD Take 237 mLs by mouth daily.   . fluticasone (FLONASE) 50 MCG/ACT nasal spray Place 1 spray into both nostrils 2 (two) times daily. (Patient taking differently: Place 2 sprays into both nostrils daily. )  . furosemide (LASIX) 20 MG tablet Take 20 mg by mouth daily.   Marland Kitchen. HYDROcodone-acetaminophen (NORCO/VICODIN) 5-325 MG tablet Take 1 tablet by mouth every 6 (six) hours as needed for moderate pain.  Marland Kitchen. ipratropium-albuterol (DUONEB) 0.5-2.5 (3) MG/3ML SOLN Inhale 3 mLs into the lungs every 6 (six) hours as needed.   . latanoprost (XALATAN) 0.005 % ophthalmic solution Place 1 drop into both eyes at bedtime.  Marland Kitchen. losartan (COZAAR) 25 MG tablet Take 25 mg by mouth daily.  . metFORMIN (GLUCOPHAGE) 500 MG tablet TAKE 1 TABLET BY MOUTH TWICE DAILY  . Multiple Vitamin (MULTIVITAMIN WITH MINERALS) TABS tablet Take 1 tablet by mouth daily.  . OXYGEN Inhale 2 L into the lungs continuous.  . pantoprazole (PROTONIX) 40 MG tablet TAKE 1 TABLET BY MOUTH EVERY NIGHT (Patient taking differently: TAKE 1 TABLET BY MOUTH EVERY MORNING)  . polyethylene glycol (MIRALAX / GLYCOLAX) packet Take 17 g by mouth daily as needed for mild constipation.  . potassium chloride SA (K-DUR,KLOR-CON) 20 MEQ tablet Take 1 tablet (20 mEq total) by mouth daily. Take while on lasix  . predniSONE (DELTASONE) 5 MG tablet Take 5 mg by mouth daily with breakfast.  . PRESCRIPTION MEDICATION Inhale into the lungs continuous. CPAP  . Tetrahydroz-Polyvinyl Al-Povid (CLEAR EYES TRIPLE ACTION) 0.05-0.5-0.6 % SOLN Place 1 drop into both eyes 3 (three) times daily as needed (dry eyes).    FAMILY HISTORY:  Her indicated that the status of her brother is unknown. She indicated that the status of her neg hx is unknown. She indicated that the status of her other is unknown.    SOCIAL HISTORY: She   reports that she has been smoking Cigarettes.  She has a 14.00 pack-year smoking history. She has never used smokeless tobacco. She reports that she drinks alcohol. She reports that she does not use drugs.  REVIEW OF SYSTEMS:   Bolds are positive  Constitutional: weight loss, gain, night sweats, Fevers, chills, fatigue .  HEENT: headaches, Sore throat, sneezing, nasal congestion, post nasal drip, Difficulty swallowing, Tooth/dental problems, visual complaints visual changes, ear ache CV:  chest pain, radiates: ,Orthopnea, PND, swelling in lower extremities, dizziness, palpitations, syncope.  GI  heartburn, indigestion, abdominal pain, nausea, vomiting, diarrhea, change in bowel habits, loss of appetite, bloody stools.  Resp: cough, productive: , hemoptysis, dyspnea, chest pain, pleuritic.  Skin: rash or itching or icterus GU: dysuria, change in color of urine, urgency or frequency. flank pain, hematuria  MS: joint pain or swelling. decreased range of motion  Psych: change in mood or affect. depression or anxiety.  Neuro: difficulty with speech, weakness, numbness, ataxia    SUBJECTIVE:    VITAL SIGNS: BP 132/62   Pulse 119   Temp 101.7 F (38.7 C) (Rectal)   Resp (!) 30   Ht 5\' 5"  (1.651 m)   Wt 52.2 kg (115 lb)   SpO2 97%   BMI 19.14 kg/m   HEMODYNAMICS:    VENTILATOR SETTINGS: Vent Mode: BIPAP;PCV FiO2 (%):  [50 %] 50 % Set  Rate:  [12 bmp] 12 bmp PEEP:  [8 cmH20] 8 cmH20  INTAKE / OUTPUT: No intake/output data recorded.  PHYSICAL EXAMINATION: General:  Chronically ill appearing female in NAD on BiPAP Neuro:  Somnolent, but easily arouses. Answers questions appropriately.  HEENT:  [place]/AT, PERRL, no JVD. Steroid associated moon face.  Cardiovascular:  RRR, no MRG Lungs:  Poor air movement on BiPAP. No obvious wheeze Abdomen:  Soft, non-tender, non-distended Musculoskeletal:  No acute deformity or ROM limitation. Skin:  Grossly intact.   LABS:  BMET  Recent  Labs Lab 11/24/15 2145  NA 139  K 4.6  CL 97*  CO2 29  BUN 19  CREATININE 0.88  GLUCOSE 106*    Electrolytes  Recent Labs Lab 11/24/15 2145  CALCIUM 9.4    CBC  Recent Labs Lab 11/24/15 2145  WBC 17.0*  HGB 10.8*  HCT 37.1  PLT 369    Coag's No results for input(s): APTT, INR in the last 168 hours.  Sepsis Markers  Recent Labs Lab 11/24/15 2218  LATICACIDVEN 1.41    ABG  Recent Labs Lab 11/24/15 2142 11/25/15 0010  PHART 7.333* 7.261*  PCO2ART 68.7* 82.0*  PO2ART 87.0 73.0*    Liver Enzymes  Recent Labs Lab 11/24/15 2145  AST 22  ALT 16  ALKPHOS 63  BILITOT 0.8  ALBUMIN 3.7    Cardiac Enzymes No results for input(s): TROPONINI, PROBNP in the last 168 hours.  Glucose No results for input(s): GLUCAP in the last 168 hours.  Imaging Dg Chest Portable 1 View  Result Date: 11/24/2015 CLINICAL DATA:  Shortness of Breath EXAM: PORTABLE CHEST 1 VIEW COMPARISON:  October 17, 2015 FINDINGS: There is airspace consolidation consistent with pneumonia in the left lower lobe region. The lungs elsewhere clear. Heart size and pulmonary vascularity are normal. No adenopathy. There is atherosclerotic calcification in the aorta. There is calcification in the left carotid artery. No bone lesions evident. IMPRESSION: Left lower lobe airspace consolidation consistent with pneumonia. Aortic atherosclerosis. There is calcification in the left carotid artery as well. Followup PA and lateral chest radiographs recommended in 3-4 weeks following trial of antibiotic therapy to ensure resolution and exclude underlying malignancy. Electronically Signed   By: Bretta BangWilliam  Woodruff III M.D.   On: 11/24/2015 21:47     STUDIES:  - Doubtamine stress echo 05/12/10 - normal, CXR July 2011 - clear - PFTs 04/13/2010 - shows Gold stage 4 COPD with BD response/asthma component (fev1 0.5L/29%, 10% BD response on fev1 and 38% on FC), DLCO 27%) - Spirometry Jan 2013 - fev1 0.74L/37%,  RAtop 44 - shows gold stage 3 copd  CULTURES: Blood 10/30 > Sputum 10/30 >  ANTIBIOTICS: Zosyn 10/31 > Vancomycin 10/31 >  SIGNIFICANT EVENTS: 10/30 admit for HCAP/COPD  LINES/TUBES:   DISCUSSION: 70 year old female with end-stage COPD on home O2 and prednisone dependent. Also on nocturnal BiPAP with which she has been noncompliant. Admitted 10/31 with healthcare associated pneumonia plus or minus COPD exacerbation. Currently she is doing okay on BiPAP. We will need to repeat ABG after BiPAP settings adjusted. If worsens may need intubation.  ASSESSMENT / PLAN:  Acute on chronic hypoxemic/hypercarbic respiratory failure COPD +/- acute exacerbation -When necessary BiPAP - settings reviewed and adjusted by me in ED -Keep FiO2 low! Goal SpO2 88-92% -Will repeat ABG in 1 hour -Scheduled DuoNeb's -When necessary albuterol -Solu-Medrol 40 mg every 6 hours -Stop smoking immediately  Healthcare associated pneumonia -Broad-spectrum antibiotics per primary team -Trend pro-calcitonin -Follow cultures  Dr.  Montez Morita had discussion with daughter who is reportedly next of kin. Who wanted to continue full code. Dr. Clayborn Bigness in [place] also spoke with daughter who now would like to have patietnt be DNI, which is in line with what the patient told me at bedside. Please refer to his note.   Joneen Roach, AGACNP-BC [facility] Pager [phone] or [phone]  11/25/2015 1:38 AM  STAFF NOTE: Cindi Carbon, MD FACP have personally reviewed patient's available data, including medical history, events of note, physical examination and test results as part of my evaluation. I have discussed with resident/NP and other care providers such as pharmacist, RN and RRT. In addition, I personally evaluated patient and elicited key findings of: alert, currently without increase WOB, just off NIMV, follows commands well, ronchi bases , no sig bronchospasm, pcxr with bibasilar PNA  left greater then rt, has nosocomial exposure, cover pseudomonas and mrsa, lactic wnl and NO hypotension, NO fluid resus needed, no sepsis noted, repeat pcxr in am , would [place] NIMV fo rnow and use PRN, have explained to pt to use early if wob increased, admit SDU , DNI/ DNR noted, BDers, limit high dose steroids with infection as able, she is steroid dep, sat goal 90%  Mcarthur Rossetti. Tyson Alias, MD, FACP Pgr: [phone] [facility] 11/25/2015 8:14 AM

## 2015-11-26 DIAGNOSIS — J9620 Acute and chronic respiratory failure, unspecified whether with hypoxia or hypercapnia: Secondary | ICD-10-CM

## 2015-11-26 LAB — COMPREHENSIVE METABOLIC PANEL
ALT: 14 U/L (ref 14–54)
ANION GAP: 11 (ref 5–15)
AST: 19 U/L (ref 15–41)
Albumin: 2.8 g/dL — ABNORMAL LOW (ref 3.5–5.0)
Alkaline Phosphatase: 54 U/L (ref 38–126)
BILIRUBIN TOTAL: 0.6 mg/dL (ref 0.3–1.2)
BUN: 24 mg/dL — ABNORMAL HIGH (ref 6–20)
CHLORIDE: 102 mmol/L (ref 101–111)
CO2: 28 mmol/L (ref 22–32)
Calcium: 8.4 mg/dL — ABNORMAL LOW (ref 8.9–10.3)
Creatinine, Ser: 0.83 mg/dL (ref 0.44–1.00)
Glucose, Bld: 120 mg/dL — ABNORMAL HIGH (ref 65–99)
POTASSIUM: 4.1 mmol/L (ref 3.5–5.1)
Sodium: 141 mmol/L (ref 135–145)
TOTAL PROTEIN: 6 g/dL — AB (ref 6.5–8.1)

## 2015-11-26 LAB — GLUCOSE, CAPILLARY
GLUCOSE-CAPILLARY: 177 mg/dL — AB (ref 65–99)
GLUCOSE-CAPILLARY: 195 mg/dL — AB (ref 65–99)
Glucose-Capillary: 125 mg/dL — ABNORMAL HIGH (ref 65–99)
Glucose-Capillary: 113 mg/dL — ABNORMAL HIGH (ref 65–99)
Glucose-Capillary: 182 mg/dL — ABNORMAL HIGH (ref 65–99)

## 2015-11-26 LAB — CBC
HEMATOCRIT: 30.7 % — AB (ref 36.0–46.0)
Hemoglobin: 9 g/dL — ABNORMAL LOW (ref 12.0–15.0)
MCH: 23.9 pg — ABNORMAL LOW (ref 26.0–34.0)
MCHC: 29.3 g/dL — AB (ref 30.0–36.0)
MCV: 81.4 fL (ref 78.0–100.0)
PLATELETS: 358 10*3/uL (ref 150–400)
RBC: 3.77 MIL/uL — ABNORMAL LOW (ref 3.87–5.11)
RDW: 17.4 % — AB (ref 11.5–15.5)
WBC: 20.7 10*3/uL — ABNORMAL HIGH (ref 4.0–10.5)

## 2015-11-26 LAB — URINE CULTURE: Culture: NO GROWTH

## 2015-11-26 LAB — LEGIONELLA PNEUMOPHILA SEROGP 1 UR AG: L. pneumophila Serogp 1 Ur Ag: NEGATIVE

## 2015-11-26 MED ORDER — IPRATROPIUM-ALBUTEROL 0.5-2.5 (3) MG/3ML IN SOLN
3.0000 mL | Freq: Four times a day (QID) | RESPIRATORY_TRACT | Status: DC
  Administered 2015-11-26 – 2015-11-30 (×17): 3 mL via RESPIRATORY_TRACT
  Filled 2015-11-26 (×17): qty 3

## 2015-11-26 NOTE — Evaluation (Signed)
Clinical/Bedside Swallow Evaluation Patient Details  Name: Nicole Maxwell MRN: [id number] Date of Birth: [birth year]  Today's Date: 11/26/2015 Time: SLP Start Time (ACUTE ONLY): 0907 SLP Stop Time (ACUTE ONLY): 0928 SLP Time Calculation (min) (ACUTE ONLY): 21 min  Past Medical History:  Past Medical History:  Diagnosis Date  . Alcohol abuse, in remission quit 2004  . Anemia of chronic disease   . Anxiety    Xanax prn  . Arthritis    "my whole body"  . Asthma   . Chronic back pain    "goes from the lower part of my back on down my legs"  (04/17/2014)  . Chronic respiratory failure with hypercapnia (HCC)   . COPD (chronic obstructive pulmonary disease) (HCC)    cxr 04/03/2010 - hyperinflation  . COPD exacerbation (HCC)   . Dysphagia   . Esophageal dysmotility   . HCAP (healthcare-associated pneumonia)   . Hyperlipidemia   . Hypertension   . Leukocytosis   . Neuromuscular disorder (HCC)    lumbar disc  . On home oxygen therapy    "2L; suppose to be 24/7" (04/17/2014)  . Osteopenia   . Physical deconditioning   . Pneumonia    "3 times in the last month or so" (04/17/2014)  . Protein calorie malnutrition (HCC)   . PVD (peripheral vascular disease) (HCC)   . Shortness of breath    with anxiety  and activty  . Tobacco abuse    " I am addicted"   . Type II diabetes mellitus (HCC)   . Vitamin D deficiency disease    03/26/2010 - 9.9ng/mL. Started on replacemnt   Past Surgical History:  Past Surgical History:  Procedure Laterality Date  . ABDOMINAL HYSTERECTOMY     "partial"  . APPENDECTOMY  1960  . BACK SURGERY    . CARPAL TUNNEL RELEASE Bilateral    CTS repair 2000 (R), 2006 (L)/notes 01/01/2014  . CATARACT EXTRACTION W/ INTRAOCULAR LENS  IMPLANT, BILATERAL Bilateral 2015  . LUMBAR LAMINECTOMY/DECOMPRESSION MICRODISCECTOMY  2005   L4-5/notes 03/06/2010  . POSTERIOR LUMBAR FUSION  2002  . TONSILLECTOMY  196320   HPI:  70 year old female with past medical history as below,  which is significant for COPD (on home O2, pred dependent, followed by Dr. Marchelle Gearingamaswamy), current smoker, esophageal dysmotility, hypertension, and diabetes. She has been admitted 3 times in the past 6 months with COPD exacerbations and questionable pneumonias. She again presents to the emergency department 10/30 after being found down at home by family members. EMS was called and found her O2 saturations to be in the low 80s. PCCM contacted for consultation. Per chart review pt CXR showed left lower lobe airspace consolidation consistent with pneumonia.   Assessment / Plan / Recommendation Clinical Impression  Pt repostitioned to the chair upon SLP arrival. She presented with a baseline, congested cough prior to PO trials. SLP noted raspy vocal quality, but pt and family stated her voicing was normal. Pt consumed trials of thin liquids, pureed, and regular solids with intermittent immediate throat clears and delayed coughing observed across all intake. Per chart review of previous MBS (02/22/14) pt had flash penetration of thin liquids, but adequate airway protection throughout the study. Concern at that time was for primary esophageal dysphagia with risk for post-prandial aspiration. Previous compensatory strategy taught was to follow solids with sips of liquid to aid in her h/o esophageal dysmotility. Pt needed Min cueing for utilization of this strategy. Recommend regular diet with thin liquids following solids with  liquid sips. Will f/u an additional time for further education for esophageal swallowing precautions and diet tolerance.    Aspiration Risk  Moderate aspiration risk    Diet Recommendation Regular;Thin liquid   Liquid Administration via: Cup Medication Administration: Crushed with puree Supervision: Patient able to self feed;Intermittent supervision to cue for compensatory strategies Compensations: Minimize environmental distractions;Slow rate;Small sips/bites;Follow solids with  liquid Postural Changes: Seated upright at 90 degrees;Remain upright for at least 30 minutes after po intake    Other  Recommendations Recommended Consults: Consider esophageal assessment Oral Care Recommendations: Oral care BID   Follow up Recommendations Other (comment) (tba)      Frequency and Duration min 1 x/week  1 week       Prognosis Prognosis for Safe Diet Advancement: Good      Swallow Study   General HPI: 70 year old female with past medical history as below, which is significant for COPD (on home O2, pred dependent, followed by Dr. Marchelle Gearingamaswamy), current smoker, esophageal dysmotility, hypertension, and diabetes. She has been admitted 3 times in the past 6 months with COPD exacerbations and questionable pneumonias. She again presents to the emergency department 10/30 after being found down at home by family members. EMS was called and found her O2 saturations to be in the low 80s. PCCM contacted for consultation. Per chart review pt CXR showed left lower lobe airspace consolidation consistent with pneumonia. Type of Study: Bedside Swallow Evaluation Previous Swallow Assessment: MBS 02/22/14 (Regular/Thin) Diet Prior to this Study: NPO Temperature Spikes Noted: No Respiratory Status: Nasal cannula History of Recent Intubation: No Behavior/Cognition: Alert;Cooperative;Pleasant mood;Requires cueing Oral Care Completed by SLP: No Vision: Functional for self-feeding Self-Feeding Abilities: Able to feed self Patient Positioning: Upright in chair Baseline Vocal Quality: Normal;Other (comment) (raspy) Volitional Cough: Strong    Oral/Motor/Sensory Function     Ice Chips Ice chips: Not tested   Thin Liquid Thin Liquid: Impaired Presentation: Cup;Self Fed Pharyngeal  Phase Impairments: Cough - Delayed;Throat Clearing - Immediate    Nectar Thick Nectar Thick Liquid: Not tested   Honey Thick Honey Thick Liquid: Not tested   Puree Puree: Impaired Presentation: Self  Fed;Spoon Pharyngeal Phase Impairments: Throat Clearing - Immediate;Cough - Delayed   Solid   GO   Solid: Impaired Presentation: Self Fed Pharyngeal Phase Impairments: Throat Clearing - Immediate;Cough - Delayed       Tollie EthHaleigh Ragan Sharlyne Koeneman, Student SLP  Caryl NeverHaleigh R Dason Mosley 11/26/2015,11:22 AM

## 2015-11-26 NOTE — Progress Notes (Signed)
PROGRESS NOTE  AILY TZENG AOZ:[id number] DOB: [birth year] DOA: 11/24/2015 PCP: [name], MD   LOS: 2 days   Brief Narrative: Nicole Maxwell is a 70 y.o. woman with a history of COPD with chronic respiratory failure on O2 2L [place] at baseline, chronic diastolic heart failure, HTN, and DM who lives independently and continues to smoke.  She has a daughter who is her next of kin/POA and speaks to her multiple times daily.  The patient is lethargic and unable to give any meaningful history (though she is oriented to person, place, and year).  History taken from EMR, ED report, and conversation with her daughter by phone.  Her daughter reports that the patient has been complaining of not feeling well for 3-4 days.  She has had a cough productive of sputum that has been thick and clear or yellow.  She had not reported fever prior to today, but she had been complaining for "going from hot to cold".  She has had decreased PO intake in the past two days.  She has had chest tightness, increased wheezing, and progressive shortness of breath.  The patient's daughter asked another family member to go by and check on the patient today.  She was found down without her oxygen on.  Portable pulse ox reportedly showed O2 sats in the low 80's.  911 was called immediately.  The patient received two breathing treatments en route to the ED.  Upon arrival here, she was placed on BiPAP with ABG showing pH 7.33, pCO2 69, pO2 87.  Assessment & Plan: Principal Problem:   Pneumonia Active Problems:   Severe chronic obstructive pulmonary disease (HCC)   Diabetes mellitus type 2, noninsulin dependent (HCC)   Acute on chronic respiratory failure (HCC)   Chronic diastolic congestive heart failure (HCC)   Acute respiratory failure with hypoxia and hypercarbia (HCC)   Tobacco use   Sepsis (HCC)   Respiratory failure (HCC)   Acute on chronic respiratory failure with hypoxia and hypercarbia secondary to LLL pneumonia, COPD  exacerbation, Sepsis without shock - PCCM consulted, appreciate input - Continue empiric vanc and zosyn, clinically improving - sepsis physiology improved - stable on [place] today - cultures negative - continue steroids, nebulizers - influenza negative  History of DM - Blood glucose checks q4h - Sensitive sliding scale - Holding metformin  Chronic diastolic HF, compensated at this time - Monitor volume status - eating well, d/c fluids  Tobacco abuse - counseled for cessation   DVT prophylaxis: Lovenox Code Status: do no intubate Family Communication: daughter bedside Disposition Plan: home when ready   Consultants:   PCCM  Procedures:  None  Antimicrobials:  Vancomycin 10/31 >>  Zosyn 10/31 >>    Subjective: - no chest pain, shortness of breath, no abdominal pain, nausea or vomiting.  - wants to go home  Objective: Vitals:   11/26/15 0753 11/26/15 0906 11/26/15 1241 11/26/15 1315  BP:  130/78  (!) 132/91  Pulse:  (!) 101  (!) 109  Resp:  (!) 22  (!) 24  Temp:  97.8 F (36.6 C)  98.8 F (37.1 C)  TempSrc:  Oral  Oral  SpO2: 95% 98% 100% 100%  Weight:      Height:        Intake/Output Summary (Last 24 hours) at 11/26/15 1518 Last data filed at 11/26/15 1108  Gross per 24 hour  Intake           2207.5 ml  Output  200 ml  Net           2007.5 ml   Filed Weights   11/24/15 2134 11/25/15 1724  Weight: 52.2 kg (115 lb) 50.6 kg (111 lb 8.8 oz)    Examination: Constitutional: NAD Vitals:   11/26/15 0753 11/26/15 0906 11/26/15 1241 11/26/15 1315  BP:  130/78  (!) 132/91  Pulse:  (!) 101  (!) 109  Resp:  (!) 22  (!) 24  Temp:  97.8 F (36.6 C)  98.8 F (37.1 C)  TempSrc:  Oral  Oral  SpO2: 95% 98% 100% 100%  Weight:      Height:       Eyes: PERRL, lids and conjunctivae normal ENMT: Mucous membranes are moist. No oropharyngeal exudates Respiratory: overall decreased breath sounds, no wheezing  Cardiovascular: Regular rate and  rhythm, no murmurs / rubs / gallops. No LE edema. Abdomen: no tenderness. Bowel sounds positive.  Skin: no rashes, lesions, ulcers. No induration Neurologic: non focal  Psychiatric: Normal judgment and insight. Alert and oriented x 3. Normal mood.    Data Reviewed: I have personally reviewed following labs and imaging studies  CBC:  Recent Labs Lab 11/24/15 2145 11/25/15 0827 11/26/15 0412  WBC 17.0* 21.7* 20.7*  HGB 10.8* 10.1* 9.0*  HCT 37.1 34.6* 30.7*  MCV 83.6 83.2 81.4  PLT 369 336 358   Basic Metabolic Panel:  Recent Labs Lab 11/24/15 2145 11/25/15 0827 11/26/15 0412  NA 139 140 141  K 4.6 4.8 4.1  CL 97* 98* 102  CO2 29 32 28  GLUCOSE 106* 182* 120*  BUN 19 27* 24*  CREATININE 0.88 1.16* 0.83  CALCIUM 9.4 9.3 8.4*   GFR: Estimated Creatinine Clearance: 49.7 mL/min (by C-G formula based on SCr of 0.83 mg/dL). Liver Function Tests:  Recent Labs Lab 11/24/15 2145 11/26/15 0412  AST 22 19  ALT 16 14  ALKPHOS 63 54  BILITOT 0.8 0.6  PROT 6.6 6.0*  ALBUMIN 3.7 2.8*   No results for input(s): LIPASE, AMYLASE in the last 168 hours. No results for input(s): AMMONIA in the last 168 hours. Coagulation Profile:  Recent Labs Lab 11/25/15 0827  INR 0.97   Cardiac Enzymes: No results for input(s): CKTOTAL, CKMB, CKMBINDEX, TROPONINI in the last 168 hours. BNP (last 3 results) No results for input(s): PROBNP in the last 8760 hours. HbA1C: No results for input(s): HGBA1C in the last 72 hours. CBG:  Recent Labs Lab 11/25/15 2003 11/25/15 2346 11/26/15 0325 11/26/15 0904 11/26/15 1312  GLUCAP 112* 127* 125* 113* 182*   Lipid Profile: No results for input(s): CHOL, HDL, LDLCALC, TRIG, CHOLHDL, LDLDIRECT in the last 72 hours. Thyroid Function Tests: No results for input(s): TSH, T4TOTAL, FREET4, T3FREE, THYROIDAB in the last 72 hours. Anemia Panel: No results for input(s): VITAMINB12, FOLATE, FERRITIN, TIBC, IRON, RETICCTPCT in the last 72  hours. Urine analysis:    Component Value Date/Time   COLORURINE YELLOW 11/25/2015 0100   APPEARANCEUR CLOUDY (A) 11/25/2015 0100   LABSPEC 1.022 11/25/2015 0100   PHURINE 6.0 11/25/2015 0100   GLUCOSEU NEGATIVE 11/25/2015 0100   HGBUR NEGATIVE 11/25/2015 0100   BILIRUBINUR SMALL (A) 11/25/2015 0100   KETONESUR 15 (A) 11/25/2015 0100   PROTEINUR 100 (A) 11/25/2015 0100   NITRITE NEGATIVE 11/25/2015 0100   LEUKOCYTESUR NEGATIVE 11/25/2015 0100   Sepsis Labs: Invalid input(s): PROCALCITONIN, LACTICIDVEN  Recent Results (from the past 240 hour(s))  Blood Culture (routine x 2)     Status: None (Preliminary  result)   Collection Time: 11/24/15  9:49 PM  Result Value Ref Range Status   Specimen Description BLOOD RIGHT ARM  Final   Special Requests BOTTLES DRAWN AEROBIC AND ANAEROBIC 5ML  Final   Culture NO GROWTH < 24 HOURS  Final   Report Status PENDING  Incomplete  Blood Culture (routine x 2)     Status: None (Preliminary result)   Collection Time: 11/24/15 10:00 PM  Result Value Ref Range Status   Specimen Description BLOOD RIGHT ARM  Final   Special Requests IN PEDIATRIC BOTTLE 3ML  Final   Culture NO GROWTH < 24 HOURS  Final   Report Status PENDING  Incomplete  Urine culture     Status: None   Collection Time: 11/25/15  1:00 AM  Result Value Ref Range Status   Specimen Description URINE, RANDOM  Final   Special Requests NONE  Final   Culture NO GROWTH  Final   Report Status 11/26/2015 FINAL  Final  Culture, expectorated sputum-assessment     Status: None   Collection Time: 11/25/15  9:07 AM  Result Value Ref Range Status   Specimen Description EXPECTORATED SPUTUM  Final   Special Requests NONE  Final   Sputum evaluation   Final    MICROSCOPIC FINDINGS SUGGEST THAT THIS SPECIMEN IS NOT REPRESENTATIVE OF LOWER RESPIRATORY SECRETIONS. PLEASE RECOLLECT. Paula ComptonCALLED D EVERETTE RN [phone] 11/25/15 A BROWNING    Report Status 11/25/2015 FINAL  Final  MRSA PCR Screening     Status:  None   Collection Time: 11/25/15  5:25 PM  Result Value Ref Range Status   MRSA by PCR NEGATIVE NEGATIVE Final    Comment:        The GeneXpert MRSA Assay (FDA approved for NASAL specimens only), is one component of a comprehensive MRSA colonization surveillance program. It is not intended to diagnose MRSA infection nor to guide or monitor treatment for MRSA infections.       Radiology Studies: Dg Chest Portable 1 View  Result Date: 11/24/2015 CLINICAL DATA:  Shortness of Breath EXAM: PORTABLE CHEST 1 VIEW COMPARISON:  October 17, 2015 FINDINGS: There is airspace consolidation consistent with pneumonia in the left lower lobe region. The lungs elsewhere clear. Heart size and pulmonary vascularity are normal. No adenopathy. There is atherosclerotic calcification in the aorta. There is calcification in the left carotid artery. No bone lesions evident. IMPRESSION: Left lower lobe airspace consolidation consistent with pneumonia. Aortic atherosclerosis. There is calcification in the left carotid artery as well. Followup PA and lateral chest radiographs recommended in 3-4 weeks following trial of antibiotic therapy to ensure resolution and exclude underlying malignancy. Electronically Signed   By: Bretta BangWilliam  Woodruff III M.D.   On: 11/24/2015 21:47     Scheduled Meds: . chlorhexidine  15 mL Mouth Rinse BID  . enoxaparin (LOVENOX) injection  40 mg Subcutaneous Daily  . insulin aspart  0-9 Units Subcutaneous Q4H  . ipratropium-albuterol  3 mL Nebulization QID  . latanoprost  1 drop Both Eyes QHS  . methylPREDNISolone (SOLU-MEDROL) injection  40 mg Intravenous Q6H  . piperacillin-tazobactam (ZOSYN)  IV  3.375 g Intravenous Q8H  . vancomycin  750 mg Intravenous Q12H   Continuous Infusions: . sodium chloride 75 mL/hr at 11/25/15 0902    Pamella Pertostin Rasheka Denard, MD, PhD [facility] Pager [phone]  If 7PM-7AM, please contact night-coverage [email or web address] Password [email or web address] 11/26/2015,  3:18 PM

## 2015-11-26 NOTE — Progress Notes (Signed)
PULMONARY / CRITICAL CARE MEDICINE   Name: Nicole Maxwell MRN: [id number] DOB: [birth year]    ADMISSION DATE:  11/24/2015 CONSULTATION DATE:  11/25/2015  REFERRING MD:  Dr. Montez Moritaarter [facility]  CHIEF COMPLAINT:  Altered mental status  HISTORY OF PRESENT ILLNESS: Patient is drowsy with some delirium. Therefore history has been obtained mostly from chart review. 70 year old female with past medical history as below, which is significant for COPD (on home O2, pred dependent,  followed by Dr. Marchelle Gearingamaswamy), current smoker, esophageal dysmotility, hypertension, and diabetes. She has been admitted 3 times in the past 6 months with COPD exacerbations and questionable pneumonias. She again presents to the emergency department 10/30 after being found down at home by family members. EMS was called and found her O2 saturations to be in the low 80s. Upon arrival to the emergency department she was placed on BiPAP, initial ABG looked okay. There was concern for sepsis as she was febrile with elevated WBC 17. Chest x-ray showed a left basilar opacification concerning for pneumonia. She was started on broad-spectrum antibiotics as she has been recently hospitalized. After some time on BiPAP she became more lethargic and ABG was assessed which was slightly worse. PCCM contacted for consultation.  Of note admitting team spoke with the patient's daughter on the telephone. She reports that her mother had not been feeling well for about 3-4 days. She had been complaining of subjective fevers/chills and has had productive cough with progressive shortness of breath.     SUBJECTIVE: Awake and alert   VITAL SIGNS: BP 130/78 (BP Location: Left Arm)   Pulse (!) 101   Temp 97.8 F (36.6 C) (Oral)   Resp (!) 22   Ht 5\' 4"  (1.626 m)   Wt 111 lb 8.8 oz (50.6 kg)   SpO2 98%   BMI 19.15 kg/m   HEMODYNAMICS:    VENTILATOR SETTINGS:    INTAKE / OUTPUT: I/O last 3 completed shifts: In: 2172.5 [I.V.:1672.5; IV  Piggyback:500] Out: 350 [Urine:350]  PHYSICAL EXAMINATION: General:  Chronically ill appearing female in NAD on [place] Neuro:  Awake and alert HEENT:  [place]/AT, PERRL, no JVD. Steroid associated moon face.  Cardiovascular:  RRR, no MRG Lungs:  Poor air movement on BiPAP. No obvious wheeze Abdomen:  Soft, non-tender, non-distended Musculoskeletal:  No acute deformity or ROM limitation. Skin:  Grossly intact.   LABS:  BMET  Recent Labs Lab 11/24/15 2145 11/25/15 0827 11/26/15 0412  NA 139 140 141  K 4.6 4.8 4.1  CL 97* 98* 102  CO2 29 32 28  BUN 19 27* 24*  CREATININE 0.88 1.16* 0.83  GLUCOSE 106* 182* 120*    Electrolytes  Recent Labs Lab 11/24/15 2145 11/25/15 0827 11/26/15 0412  CALCIUM 9.4 9.3 8.4*    CBC  Recent Labs Lab 11/24/15 2145 11/25/15 0827 11/26/15 0412  WBC 17.0* 21.7* 20.7*  HGB 10.8* 10.1* 9.0*  HCT 37.1 34.6* 30.7*  PLT 369 336 358    Coag's  Recent Labs Lab 11/25/15 0827  APTT 27  INR 0.97    Sepsis Markers  Recent Labs Lab 11/24/15 2218 11/25/15 0827 11/25/15 0842  LATICACIDVEN 1.41 1.7 1.66  PROCALCITON  --  11.09  --     ABG  Recent Labs Lab 11/25/15 0010 11/25/15 0425 11/25/15 1211  PHART 7.261* 7.321* 7.348*  PCO2ART 82.0* 65.5* 61.3*  PO2ART 73.0* 77.0* 83.0    Liver Enzymes  Recent Labs Lab 11/24/15 2145 11/26/15 0412  AST 22 19  ALT 16  14  ALKPHOS 63 54  BILITOT 0.8 0.6  ALBUMIN 3.7 2.8*    Cardiac Enzymes No results for input(s): TROPONINI, PROBNP in the last 168 hours.  Glucose  Recent Labs Lab 11/25/15 1652 11/25/15 1759 11/25/15 2003 11/25/15 2346 11/26/15 0325 11/26/15 0904  GLUCAP 134* 129* 112* 127* 125* 113*    Imaging No results found.   STUDIES:  - Doubtamine stress echo 05/12/10 - normal, CXR July 2011 - clear - PFTs 04/13/2010 - shows Gold stage 4 COPD with BD response/asthma component (fev1 0.5L/29%, 10% BD response on fev1 and 38% on FC), DLCO 27%) - Spirometry Jan  2013 - fev1 0.74L/37%, RAtop 44 - shows gold stage 3 copd  CULTURES: Blood 10/30 > Sputum 10/30 >  ANTIBIOTICS: Zosyn 10/31 > Vancomycin 10/31 >  SIGNIFICANT EVENTS: 10/30 admit for HCAP/COPD  LINES/TUBES:   DISCUSSION: 70 year old female with end-stage COPD on home O2 and prednisone dependent. Also on nocturnal BiPAP with which she has been noncompliant. Admitted 10/31 with healthcare associated pneumonia plus or minus COPD exacerbation. Currently she is doing okay on BiPAP. We will need to repeat ABG after BiPAP settings adjusted. If worsens may need intubation.  ASSESSMENT / PLAN:  Acute on chronic hypoxemic/hypercarbic respiratory failure COPD +/- acute exacerbation Multiple admits Continued tobacco  abuse -When necessary BiPAP - currently off -Keep FiO2 low. Goal SpO2 88-92% -Scheduled DuoNeb's -When necessary albuterol -Solu-Medrol 40 mg every 6 hours, wean quickly  -Stop smoking  -EOL discussion  Healthcare associated pneumonia -Broad-spectrum antibiotics per primary team -Trend pro-calcitonin -Follow cultures  Dr. Montez Moritaarter had discussion with daughter who is reportedly next of kin. Who wanted to continue full code. Dr. Clayborn BignesseDios [place] also spoke with daughter who now would like to have patietnt be DNI, which is in line with what the patient told provider at bedside. Please refer to his note.   Brett CanalesSteve Minor ACNP [facility] Pager [phone] till 3 pm If no answer page [phone] 11/26/2015, 9:45 AM   Attending note: I have seen and examined the patient with nurse practitioner/resident and agree with the note. History, labs and imaging reviewed.  70 Y/O with end-stage COPD on home O2 and prednisone dependent. Admitted with another exacerbation. She is still smoking, non compliant with Bipap at home CXR reviewed- L > R consolidation c/w pneumonia. Now off Bipap. No resp distress on examination.  Continue steroids for now with duo nebs Vanco and zosyn for abx  coverage Code status- No intubation but apparently ok with resuscitation.  We will need to continue to address goals of care with patient and daughter. She may need a palliative care consult.   Chilton GreathousePraveen Jeanett Antonopoulos MD [place] Pager [phone] If no answer or after 3pm call: [phone] 11/26/2015, 4:56 PM

## 2015-11-27 ENCOUNTER — Inpatient Hospital Stay (HOSPITAL_COMMUNITY): Payer: Medicare Other

## 2015-11-27 DIAGNOSIS — J13 Pneumonia due to Streptococcus pneumoniae: Secondary | ICD-10-CM

## 2015-11-27 DIAGNOSIS — J441 Chronic obstructive pulmonary disease with (acute) exacerbation: Secondary | ICD-10-CM

## 2015-11-27 LAB — GLUCOSE, CAPILLARY
GLUCOSE-CAPILLARY: 146 mg/dL — AB (ref 65–99)
GLUCOSE-CAPILLARY: 167 mg/dL — AB (ref 65–99)
GLUCOSE-CAPILLARY: 174 mg/dL — AB (ref 65–99)
GLUCOSE-CAPILLARY: 181 mg/dL — AB (ref 65–99)
GLUCOSE-CAPILLARY: 184 mg/dL — AB (ref 65–99)
Glucose-Capillary: 126 mg/dL — ABNORMAL HIGH (ref 65–99)
Glucose-Capillary: 137 mg/dL — ABNORMAL HIGH (ref 65–99)
Glucose-Capillary: 163 mg/dL — ABNORMAL HIGH (ref 65–99)

## 2015-11-27 MED ORDER — ALPRAZOLAM 0.5 MG PO TABS
0.5000 mg | ORAL_TABLET | Freq: Two times a day (BID) | ORAL | Status: DC | PRN
  Administered 2015-11-28 – 2015-11-30 (×4): 0.5 mg via ORAL
  Filled 2015-11-27 (×4): qty 1

## 2015-11-27 MED ORDER — LEVOFLOXACIN IN D5W 500 MG/100ML IV SOLN
500.0000 mg | INTRAVENOUS | Status: DC
  Administered 2015-11-27 – 2015-11-28 (×2): 500 mg via INTRAVENOUS
  Filled 2015-11-27 (×2): qty 100

## 2015-11-27 MED ORDER — PREDNISONE 50 MG PO TABS
50.0000 mg | ORAL_TABLET | Freq: Every day | ORAL | Status: DC
  Administered 2015-11-27 – 2015-11-28 (×2): 50 mg via ORAL
  Filled 2015-11-27: qty 2
  Filled 2015-11-27: qty 1

## 2015-11-27 MED ORDER — ALPRAZOLAM 0.5 MG PO TABS: 1.0000 mg | ORAL_TABLET | Freq: Two times a day (BID) | ORAL | Status: DC | PRN

## 2015-11-27 NOTE — Clinical Social Work Note (Signed)
CSW called and left voicemail for patient's daughter, [name], for assessment and to discuss SNF placement.  Charlynn CourtSarah Kynzleigh Bandel, CSW [phone]

## 2015-11-27 NOTE — Care Management Note (Signed)
Case Management Note  Patient Details  Name: Earlyne Ibalta S Woodson MRN: [id number] Date of Birth: 1945/03/27  Subjective/Objective:    Patient is from home alone, await pt eval.  Patient has pcp , medication coverage, she also has home oxygen with [employer].  She states she will have transport at discharge.  She has a rolling walker and a bsc at home. NCM will cont to follow for dc needs.                 Action/Plan:   Expected Discharge Date:                  Expected Discharge Plan:  Home w Home Health Services  In-House Referral:     Discharge planning Services  CM Consult  Post Acute Care Choice:    Choice offered to:     DME Arranged:    DME Agency:     HH Arranged:    HH Agency:     Status of Service:  In process, will continue to follow  If discussed at Long Length of Stay Meetings, dates discussed:    Additional Comments:  [name], Damyn Weitzel [place], RN 11/27/2015, 12:45 PM

## 2015-11-27 NOTE — Progress Notes (Signed)
Pt tx [facility] [place] per MD order, pt VS stable, [facility] other [facility] belongs visible, report called to receiving RN, all questions answered  Pt daughter called stated pt missing small personal bag, bag did not have anything in it per Daughter, this RN searched room, no bag was found

## 2015-11-27 NOTE — Progress Notes (Signed)
PULMONARY / CRITICAL CARE MEDICINE   Name: Nicole Maxwell MRN: [id number] DOB: [birth year]    ADMISSION DATE:  11/24/2015 CONSULTATION DATE:  11/25/2015  REFERRING MD:  [facility]  CHIEF COMPLAINT:  Altered mental status  BRIEF SUMMARY:  70 yo female smoker admitted with hypoxia and altered mental status from HCAP and COPD exacerbation.  She is followed by Dr. Marchelle Gearingamaswamy as outpt for GOLD D COPD (FEV1 0.5/29%, DLCO 27% from 04/13/10).    SUBJECTIVE:  Pt reports she has difficulty swallowing / food gets "hung up".  Denies SOB.  Eating breakfast.     VITAL SIGNS: BP 118/62 (BP Location: Left Arm)   Pulse 86   Temp 98.2 F (36.8 C) (Oral)   Resp 13   Ht 5\' 4"  (1.626 m)   Wt 111 lb 8.8 oz (50.6 kg)   SpO2 100%   BMI 19.15 kg/m   INTAKE / OUTPUT: I/O last 3 completed shifts: In: 2457.5 [I.V.:1957.5; IV Piggyback:500] Out: 1050 [Urine:1050]  PHYSICAL EXAMINATION: General:  Chronically ill appearing female in NAD  Neuro:  Awake and alert HEENT:  [place]/AT, PERRL, no JVD.   Cardiovascular:  RRR, no MRG Lungs: non-labored, lungs bilaterally diminished with occasional faint exp wheeze Abdomen:  Soft, non-tender, non-distended Musculoskeletal:  No acute deformity or ROM limitation. Skin:  Grossly intact.   LABS: CMP Latest Ref Rng & Units 11/26/2015 11/25/2015 11/24/2015  Glucose 65 - 99 mg/dL 308(M120(H) 578(I182(H) 696(E106(H)  BUN 6 - 20 mg/dL 95(M24(H) 84(X27(H) 19  Creatinine 0.44 - 1.00 mg/dL 3.240.83 4.01(U1.16(H) 2.720.88  Sodium 135 - 145 mmol/L 141 140 139  Potassium 3.5 - 5.1 mmol/L 4.1 4.8 4.6  Chloride 101 - 111 mmol/L 102 98(L) 97(L)  CO2 22 - 32 mmol/L 28 32 29  Calcium 8.9 - 10.3 mg/dL 5.3(G8.4(L) 9.3 9.4  Total Protein 6.5 - 8.1 g/dL 6.0(L) - 6.6  Total Bilirubin 0.3 - 1.2 mg/dL 0.6 - 0.8  Alkaline Phos 38 - 126 U/L 54 - 63  AST 15 - 41 U/L 19 - 22  ALT 14 - 54 U/L 14 - 16    CBC Latest Ref Rng & Units 11/26/2015 11/25/2015 11/24/2015  WBC 4.0 - 10.5 K/uL 20.7(H) 21.7(H) 17.0(H)  Hemoglobin 12.0  - 15.0 g/dL 9.0(L) 10.1(L) 10.8(L)  Hematocrit 36.0 - 46.0 % 30.7(L) 34.6(L) 37.1  Platelets 150 - 400 K/uL 358 336 369    ABG    Component Value Date/Time   PHART 7.348 (L) 11/25/2015 1211   PCO2ART 61.3 (H) 11/25/2015 1211   PO2ART 83.0 11/25/2015 1211   HCO3 33.7 (H) 11/25/2015 1211   TCO2 36 11/25/2015 1211   O2SAT 95.0 11/25/2015 1211    IMAGING No results found.   CULTURES: Blood 10/30 >> Sputum 10/30 >> not lower respiratory secretions UC 10/31 >> negative   ANTIBIOTICS: Zosyn 10/31 >> 11/2 Vancomycin 10/31 >> 11/2 Levaquin 11/2 >>   ASSESSMENT / PLAN:  Acute on chronic hypoxemic/hypercarbic respiratory failure COPD +/- acute exacerbation Multiple admits Continued tobacco  Abuse -QHS & PRN BiPAP  -Keep FiO2 low. Goal SpO2 88-92% -Scheduled DuoNeb's + PRN albuterol  -Prednisone with taper to off over 7-10 days -Stop smoking  -EOL discussion -Minimize sedating medications > reduce PRN xanax to 0.5 -DNI -Follow up with pulmonary as outpatient  Healthcare associated pneumonia -ABX narrowed to Levaquin, D5/x abx -Trend pro-calcitonin -Follow cultures -Esophagram pending, agree with aspiration evaluation  -Pulmonary hygiene    PCCM will be available PRN. Please call back if  new needs arise.   Canary BrimBrandi Ollis, NP-C [facility] Pgr: [phone] or if no answer [phone] 11/27/2015, 10:22 AM  Cough and breathing improved.    No wheeze.  HR regular.  Assessment/plan:  COPD exacerbation. HCAP. Acute on chronic hypoxic/hypercapnic respiratory failure. Tobacco abuse. - DNI - wean prednisone - continue BDs - oxygen to keep SpO2 90 to 95% - abx per primary team - would benefit from palliative care assessment, and home hospice assessment  PCCM will sign off.  Please call if additional help needed while she is in hospital.  Coralyn HellingVineet Sachin Ferencz, MD [facility] 11/27/2015, 2:46 PM Pager:  [phone] After 3pm call:  [phone]

## 2015-11-27 NOTE — Evaluation (Signed)
Physical Therapy Evaluation Patient Details Name: Nicole Maxwell MRN: [id number] DOB: 1945-02-09 Today's Date: 11/27/2015   History of Present Illness  70 year old female with past medical history as below, which is significant for COPD (on home O2, pred dependent,  followed by Dr. Marchelle Gearingamaswamy), current smoker, esophageal dysmotility, hypertension, and diabetes. She has been admitted 3 times in the past 6 months with COPD exacerbations and questionable pneumonias. She again presents to the emergency department 10/30 after being found down at home by family members.   Clinical Impression  Patient presents with decreased independence and safety with mobility due to deficits listed in in PT problem list.  She will benefit from skilled PT in the acute setting to allow return home following STSNF level rehab stay.  IF she has capable 24 hour assist at home could go home with HHPT, but not clear per pt or chart.      Follow Up Recommendations SNF;Supervision/Assistance - 24 hour    Equipment Recommendations  None recommended by PT    Recommendations for Other Services       Precautions / Restrictions Precautions Precautions: Fall Precaution Comments: Oxygen dependent      Mobility  Bed Mobility Overal bed mobility: Needs Assistance Bed Mobility: Supine to Sit;Sit to Supine     Supine to sit: Supervision;HOB elevated Sit to supine: Min guard   General bed mobility comments: assist for line management, to supine, assist for positioning  Transfers Overall transfer level: Needs assistance Equipment used: Rolling walker (2 wheeled) Transfers: Sit to/from Stand Sit to Stand: Min guard         General transfer comment: slow to rise, but assist for safety with lines and walker due to pt pulling up on walker  Ambulation/Gait Ambulation/Gait assistance: Supervision;Min guard Ambulation Distance (Feet): 120 Feet Assistive device: Rolling walker (2 wheeled) Gait Pattern/deviations:  Step-through pattern;Decreased stride length     General Gait Details: somewhat unsteady with gait, looking around and into rooms with assist for balance, SpO2 on RA drops to 85% improved with standing rest and cues for PLB; was on O2 at rest  Stairs            Wheelchair Mobility    Modified Rankin (Stroke Patients Only)       Balance Overall balance assessment: Needs assistance   Sitting balance-Leahy Scale: Good     Standing balance support: Bilateral upper extremity supported Standing balance-Leahy Scale: Poor Standing balance comment: UE support needed for balance                             Pertinent Vitals/Pain Faces Pain Scale: No hurt    Home Living Family/patient expects to be discharged to:: Private residence Living Arrangements: Children;Other relatives Available Help at Discharge: Family;Available PRN/intermittently Type of Home: House Home Access: Level entry     Home Layout: One level Home Equipment: Walker - 2 wheels;Shower seat;Cane - single point;Grab bars - tub/shower      Prior Function Level of Independence: Independent with assistive device(s)         Comments: info obtained from previous visit as pt inconsistent with answers to questions about PLOF; states that she is staying in apartment near where family is due to some work being done on her home     Hand Dominance   Dominant Hand: Right    Extremity/Trunk Assessment   Upper Extremity Assessment: Overall WFL for tasks assessed  Lower Extremity Assessment: Generalized weakness         Communication   Communication: No difficulties  Cognition Arousal/Alertness: Awake/alert Behavior During Therapy: WFL for tasks assessed/performed Overall Cognitive Status: Impaired/Different from baseline Area of Impairment: Orientation;Attention;Safety/judgement Orientation Level: Place;Situation;Time;Disoriented to Current Attention Level: Sustained      Safety/Judgement: Decreased awareness of deficits;Decreased awareness of safety          General Comments      Exercises     Assessment/Plan    PT Assessment Patient needs continued PT services  PT Problem List Decreased balance;Decreased activity tolerance;Decreased cognition;Decreased strength;Decreased mobility;Decreased safety awareness          PT Treatment Interventions DME instruction;Therapeutic activities;Gait training;Functional mobility training;Therapeutic exercise;Balance training;Patient/family education    PT Goals (Current goals can be found in the Care Plan section)  Acute Rehab PT Goals Patient Stated Goal: To return home today PT Goal Formulation: Patient unable to participate in goal setting Time For Goal Achievement: 12/04/15 Potential to Achieve Goals: Fair    Frequency Min 3X/week   Barriers to discharge Decreased caregiver support could not get any answer from pt if family can assist at home    Co-evaluation               End of Session Equipment Utilized During Treatment: Gait belt Activity Tolerance: Patient tolerated treatment well Patient left: in bed;with call bell/phone within reach;with nursing/sitter in room           Time: 1037-1108 PT Time Calculation (min) (ACUTE ONLY): 31 min   Charges:   PT Evaluation $PT Eval Moderate Complexity: 1 Procedure PT Treatments $Gait Training: 8-22 mins   PT G CodesElray Maxwell:        Nicole Maxwell 11/27/2015, 2:31 PM  Nicole Maxwell, PT [phone] 11/27/2015

## 2015-11-27 NOTE — Progress Notes (Signed)
PROGRESS NOTE  Nicole Maxwell:[id number] DOB: [birth year] DOA: 11/24/2015 PCP: [name], MD   LOS: 3 days   Brief Narrative: Nicole Maxwell is a 70 y.o. woman with a history of COPD with chronic respiratory failure on O2 2L Rankin at baseline, chronic diastolic heart failure, HTN, and DM who lives independently and continues to smoke.  She has a daughter who is her next of kin/POA and speaks to her multiple times daily.  The patient is lethargic and unable to give any meaningful history (though she is oriented to person, place, and year).  History taken from EMR, ED report, and conversation with her daughter by phone.  Her daughter reports that the patient has been complaining of not feeling well for 3-4 days.  She has had a cough productive of sputum that has been thick and clear or yellow.  She had not reported fever prior to today, but she had been complaining for "going from hot to cold".  She has had decreased PO intake in the past two days.  She has had chest tightness, increased wheezing, and progressive shortness of breath.  The patient's daughter asked another family member to go by and check on the patient today.  She was found down without her oxygen on.  Portable pulse ox reportedly showed O2 sats in the low 80's.  911 was called immediately.  The patient received two breathing treatments en route to the ED.  Upon arrival here, she was placed on BiPAP with ABG showing pH 7.33, pCO2 69, pO2 87.  Assessment & Plan: Principal Problem:   Pneumonia Active Problems:   Severe chronic obstructive pulmonary disease (HCC)   Diabetes mellitus type 2, noninsulin dependent (HCC)   Acute on chronic respiratory failure (HCC)   Chronic diastolic congestive heart failure (HCC)   Acute respiratory failure with hypoxia and hypercarbia (HCC)   Tobacco use   Sepsis (HCC)   Respiratory failure (HCC)   Acute on chronic respiratory failure with hypoxia and hypercarbia secondary to LLL pneumonia, COPD  exacerbation, Sepsis without shock - PCCM consulted, appreciate input - Continue empiric vanc and zosyn, clinically improving - sepsis physiology improved - stable on [place] today - cultures negative - continue steroids, nebulizers - influenza negative  Dysphagia - patient with long standing complaints of dysphagia  - she had a barium swallow on 02/2014 which showed "nonspecific esophageal dysmotility and considered indeterminate for smooth strictures due to the contractions. Given that the barium tablet impacted in the distal esophagus, endoscopy may be warranted for further characterization.". Never had an EGD. Will repeat barium study - she may benefit from EGD however would pursue that once her respiratory status is at baseline, will need outpatient follow up   History of DM - Blood glucose checks q4h - Sensitive sliding scale - Holding metformin  Chronic diastolic HF, compensated at this time - Monitor volume status - eating well, d/c fluids  Tobacco abuse - counseled for cessation   DVT prophylaxis: Lovenox Code Status: do no intubate Family Communication: daughter bedside Disposition Plan: home when ready   Consultants:   PCCM  Procedures:  None  Antimicrobials:  Vancomycin 10/31 >>  Zosyn 10/31 >>    Subjective: - no chest pain, shortness of breath, no abdominal pain, nausea or vomiting.  - wants to go home  Objective: Vitals:   11/27/15 0316 11/27/15 0729 11/27/15 0800 11/27/15 0841  BP: 140/72 118/62    Pulse: 86 92 86   Resp: (!) 23 18  13   Temp: 97.8 F (36.6 C) 98.2 F (36.8 C)    TempSrc: Oral Oral    SpO2: 96% 100% 100% 100%  Weight:      Height:        Intake/Output Summary (Last 24 hours) at 11/27/15 1137 Last data filed at 11/27/15 1008  Gross per 24 hour  Intake              370 ml  Output              850 ml  Net             -480 ml   Filed Weights   11/24/15 2134 11/25/15 1724  Weight: 52.2 kg (115 lb) 50.6 kg (111 lb 8.8 oz)      Examination: Constitutional: NAD Vitals:   11/27/15 0316 11/27/15 0729 11/27/15 0800 11/27/15 0841  BP: 140/72 118/62    Pulse: 86 92 86   Resp: (!) 23 18 13    Temp: 97.8 F (36.6 C) 98.2 F (36.8 C)    TempSrc: Oral Oral    SpO2: 96% 100% 100% 100%  Weight:      Height:       Eyes: PERRL, lids and conjunctivae normal ENMT: Mucous membranes are moist. No oropharyngeal exudates Respiratory: overall decreased breath sounds, no wheezing  Cardiovascular: Regular rate and rhythm, no murmurs / rubs / gallops. No LE edema. Abdomen: no tenderness. Bowel sounds positive.  Skin: no rashes, lesions, ulcers. No induration Neurologic: non focal  Psychiatric: Normal judgment and insight. Alert and oriented x 3. Normal mood.    Data Reviewed: I have personally reviewed following labs and imaging studies  CBC:  Recent Labs Lab 11/24/15 2145 11/25/15 0827 11/26/15 0412  WBC 17.0* 21.7* 20.7*  HGB 10.8* 10.1* 9.0*  HCT 37.1 34.6* 30.7*  MCV 83.6 83.2 81.4  PLT 369 336 358   Basic Metabolic Panel:  Recent Labs Lab 11/24/15 2145 11/25/15 0827 11/26/15 0412  NA 139 140 141  K 4.6 4.8 4.1  CL 97* 98* 102  CO2 29 32 28  GLUCOSE 106* 182* 120*  BUN 19 27* 24*  CREATININE 0.88 1.16* 0.83  CALCIUM 9.4 9.3 8.4*   GFR: Estimated Creatinine Clearance: 49.7 mL/min (by C-G formula based on SCr of 0.83 mg/dL). Liver Function Tests:  Recent Labs Lab 11/24/15 2145 11/26/15 0412  AST 22 19  ALT 16 14  ALKPHOS 63 54  BILITOT 0.8 0.6  PROT 6.6 6.0*  ALBUMIN 3.7 2.8*   No results for input(s): LIPASE, AMYLASE in the last 168 hours. No results for input(s): AMMONIA in the last 168 hours. Coagulation Profile:  Recent Labs Lab 11/25/15 0827  INR 0.97   Cardiac Enzymes: No results for input(s): CKTOTAL, CKMB, CKMBINDEX, TROPONINI in the last 168 hours. BNP (last 3 results) No results for input(s): PROBNP in the last 8760 hours. HbA1C: No results for input(s): HGBA1C  in the last 72 hours. CBG:  Recent Labs Lab 11/26/15 1720 11/26/15 1955 11/27/15 0027 11/27/15 0317 11/27/15 0727  GLUCAP 177* 195* 174* 146* 126*   Lipid Profile: No results for input(s): CHOL, HDL, LDLCALC, TRIG, CHOLHDL, LDLDIRECT in the last 72 hours. Thyroid Function Tests: No results for input(s): TSH, T4TOTAL, FREET4, T3FREE, THYROIDAB in the last 72 hours. Anemia Panel: No results for input(s): VITAMINB12, FOLATE, FERRITIN, TIBC, IRON, RETICCTPCT in the last 72 hours. Urine analysis:    Component Value Date/Time   COLORURINE YELLOW 11/25/2015 0100  APPEARANCEUR CLOUDY (A) 11/25/2015 0100   LABSPEC 1.022 11/25/2015 0100   PHURINE 6.0 11/25/2015 0100   GLUCOSEU NEGATIVE 11/25/2015 0100   HGBUR NEGATIVE 11/25/2015 0100   BILIRUBINUR SMALL (A) 11/25/2015 0100   KETONESUR 15 (A) 11/25/2015 0100   PROTEINUR 100 (A) 11/25/2015 0100   NITRITE NEGATIVE 11/25/2015 0100   LEUKOCYTESUR NEGATIVE 11/25/2015 0100   Sepsis Labs: Invalid input(s): PROCALCITONIN, LACTICIDVEN  Recent Results (from the past 240 hour(s))  Blood Culture (routine x 2)     Status: None (Preliminary result)   Collection Time: 11/24/15  9:49 PM  Result Value Ref Range Status   Specimen Description BLOOD RIGHT ARM  Final   Special Requests BOTTLES DRAWN AEROBIC AND ANAEROBIC 5ML  Final   Culture NO GROWTH 3 DAYS  Final   Report Status PENDING  Incomplete  Blood Culture (routine x 2)     Status: None (Preliminary result)   Collection Time: 11/24/15 10:00 PM  Result Value Ref Range Status   Specimen Description BLOOD RIGHT ARM  Final   Special Requests IN PEDIATRIC BOTTLE 3ML  Final   Culture NO GROWTH 3 DAYS  Final   Report Status PENDING  Incomplete  Urine culture     Status: None   Collection Time: 11/25/15  1:00 AM  Result Value Ref Range Status   Specimen Description URINE, RANDOM  Final   Special Requests NONE  Final   Culture NO GROWTH  Final   Report Status 11/26/2015 FINAL  Final   Culture, expectorated sputum-assessment     Status: None   Collection Time: 11/25/15  9:07 AM  Result Value Ref Range Status   Specimen Description EXPECTORATED SPUTUM  Final   Special Requests NONE  Final   Sputum evaluation   Final    MICROSCOPIC FINDINGS SUGGEST THAT THIS SPECIMEN IS NOT REPRESENTATIVE OF LOWER RESPIRATORY SECRETIONS. PLEASE RECOLLECT. Paula ComptonCALLED D EVERETTE RN [phone] 11/25/15 A BROWNING    Report Status 11/25/2015 FINAL  Final  MRSA PCR Screening     Status: None   Collection Time: 11/25/15  5:25 PM  Result Value Ref Range Status   MRSA by PCR NEGATIVE NEGATIVE Final    Comment:        The GeneXpert MRSA Assay (FDA approved for NASAL specimens only), is one component of a comprehensive MRSA colonization surveillance program. It is not intended to diagnose MRSA infection nor to guide or monitor treatment for MRSA infections.       Radiology Studies: No results found.   Scheduled Meds: . chlorhexidine  15 mL Mouth Rinse BID  . enoxaparin (LOVENOX) injection  40 mg Subcutaneous Daily  . insulin aspart  0-9 Units Subcutaneous Q4H  . ipratropium-albuterol  3 mL Nebulization QID  . latanoprost  1 drop Both Eyes QHS  . levofloxacin (LEVAQUIN) IV  500 mg Intravenous Q24H  . predniSONE  50 mg Oral Q breakfast   Continuous Infusions:    Pamella Pertostin Yulian Gosney, MD, PhD [facility] Pager [phone]  If 7PM-7AM, please contact night-coverage [email or web address] Password [email or web address] 11/27/2015, 11:37 AM

## 2015-11-27 NOTE — Progress Notes (Signed)
NURSING PROGRESS NOTE  Nicole Ibalta S Lehrke [id number] Transfer Data: 11/27/2015 3:52 PM Attending Provider: Leatha Gildingostin M Gherghe, MD WUJ:WJXBPCP:Shaw, Chrissie NoaWilliam, MD Code Status: Partial; Do Not intubate Allergies:  Brovana [arformoterol tartrate]; Budesonide; and Mucomyst [acetylcysteine] Past Medical History:   has a past medical history of Alcohol abuse, in remission (quit 2004); Anemia of chronic disease; Anxiety; Arthritis; Asthma; Chronic back pain; Chronic respiratory failure with hypercapnia (HCC); COPD (chronic obstructive pulmonary disease) (HCC); COPD exacerbation (HCC); Dysphagia; Esophageal dysmotility; HCAP (healthcare-associated pneumonia); Hyperlipidemia; Hypertension; Leukocytosis; Neuromuscular disorder (HCC); On home oxygen therapy; Osteopenia; Physical deconditioning; Pneumonia; Protein calorie malnutrition (HCC); PVD (peripheral vascular disease) (HCC); Shortness of breath; Tobacco abuse; Type II diabetes mellitus (HCC); and Vitamin D deficiency disease. Past Surgical History:   has a past surgical history that includes Lumbar laminectomy/decompression microdiscectomy (2005); Appendectomy (1960); Tonsillectomy (1960); Posterior lumbar fusion (2002); Abdominal hysterectomy; Back surgery; Carpal tunnel release (Bilateral); and Cataract extraction w/ intraocular lens  implant, bilateral (Bilateral, 2015). Social History:   reports that she has been smoking Cigarettes.  She has a 14.00 pack-year smoking history. She has never used smokeless tobacco. She reports that she drinks alcohol. She reports that she does not use drugs.  Nicole Maxwell is a 70 y.o. female patient transferred from 3S  Blood pressure (!) 154/68, pulse 99, temperature 98.5 F (36.9 C), temperature source Oral, resp. rate 18, height 5\' 4"  (1.626 m), weight 54.3 kg (119 lb 11.2 oz), SpO2 98 %.  Cardiac Monitoring: Box # 09 in place. Cardiac monitor yields:sinus tachycardia.  IV Fluids:  IV in place, occlusive dsg intact without redness,  IV cath forearm left, condition patent and no redness and antecubital right, condition patent and no redness none.   Skin: Intact  Will continue to evaluate and treat per MD orders.  Colin InaHancock, Siddalee Vanderheiden J

## 2015-11-27 NOTE — Progress Notes (Signed)
SLP Cancellation Note  Patient Details Name: Nicole Maxwell MRN: [id number] DOB: May 18, 1945   Cancelled treatment:       Reason Eval/Treat Not Completed: Other (comment) Attempted to see pt earlier today but she was working with PT. Per chart review, MD ordered barium swallow with results still pending. Will f/u as able.   Maxcine Hamaiewonsky, Sincerity Cedar 11/27/2015, 3:07 PM  Maxcine HamLaura Paiewonsky, M.A. CCC-SLP [phone]

## 2015-11-28 LAB — BLOOD GAS, ARTERIAL
Acid-Base Excess: 6.9 mmol/L — ABNORMAL HIGH (ref 0.0–2.0)
Bicarbonate: 31.4 mmol/L — ABNORMAL HIGH (ref 20.0–28.0)
DRAWN BY: 257081
O2 Content: 1.5 L/min
O2 Saturation: 92.8 %
PATIENT TEMPERATURE: 98.6
PH ART: 7.423 (ref 7.350–7.450)
pCO2 arterial: 48.9 mmHg — ABNORMAL HIGH (ref 32.0–48.0)
pO2, Arterial: 67.2 mmHg — ABNORMAL LOW (ref 83.0–108.0)

## 2015-11-28 LAB — GLUCOSE, CAPILLARY
GLUCOSE-CAPILLARY: 193 mg/dL — AB (ref 65–99)
GLUCOSE-CAPILLARY: 131 mg/dL — AB (ref 65–99)
Glucose-Capillary: 100 mg/dL — ABNORMAL HIGH (ref 65–99)
Glucose-Capillary: 88 mg/dL (ref 65–99)
Glucose-Capillary: 153 mg/dL — ABNORMAL HIGH (ref 65–99)
Glucose-Capillary: 133 mg/dL — ABNORMAL HIGH (ref 65–99)

## 2015-11-28 MED ORDER — LEVOFLOXACIN 500 MG PO TABS
500.0000 mg | ORAL_TABLET | Freq: Every day | ORAL | Status: DC
  Administered 2015-11-29 – 2015-11-30 (×2): 500 mg via ORAL
  Filled 2015-11-28 (×2): qty 1

## 2015-11-28 MED ORDER — METHYLPREDNISOLONE SODIUM SUCC 125 MG IJ SOLR
60.0000 mg | Freq: Two times a day (BID) | INTRAMUSCULAR | Status: DC
  Administered 2015-11-28 – 2015-11-29 (×3): 60 mg via INTRAVENOUS
  Filled 2015-11-28 (×4): qty 2

## 2015-11-28 MED ORDER — TRAMADOL HCL 50 MG PO TABS
25.0000 mg | ORAL_TABLET | Freq: Two times a day (BID) | ORAL | Status: DC | PRN
  Administered 2015-11-28 – 2015-11-29 (×2): 25 mg via ORAL
  Filled 2015-11-28 (×2): qty 1

## 2015-11-28 NOTE — Progress Notes (Signed)
[facility] given to [facility]tr- [facility] per MD possibly ready over the weekend- [facility] is [facility]  Burna SisJenna H. Kenith Trickel, LCSW Clinical Social Worker [phone]

## 2015-11-28 NOTE — [place] FL2 (Signed)
[place] MEDICAID FL2 LEVEL OF CARE SCREENING TOOL     IDENTIFICATION  Patient Name: Nicole Maxwell Birthdate: 1945-06-25 Sex: female Admission Date (Current Location): 11/24/2015  [facility] [place]   Facility and Address:  [facility], [address], [place], [place] [address]      Provider Number: [id number]  Attending Physician Name and Address:  Leatha Gildingostin M Gherghe, MD  Relative Name and Phone Number:       Current Level of Care: Hospital Recommended Level of Care: Skilled Nursing Facility Prior Approval Number:    Date Approved/Denied:   PASRR Number: [id number]  Discharge Plan: SNF    Current Diagnoses: Patient Active Problem List   Diagnosis Date Noted  . Sepsis (HCC) 11/25/2015  . Respiratory failure (HCC) 11/25/2015  . Pneumonia 11/24/2015  . Acute respiratory failure with hypercapnia (HCC) 10/17/2015  . Anemia 06/30/2015  . CAP (community acquired pneumonia) 06/30/2015  . Acute encephalopathy 06/30/2015  . Tobacco use 06/30/2015  . Abnormal echocardiogram 04/07/2015  . Protein-calorie malnutrition, severe 03/23/2015  . Acute respiratory failure with hypoxia and hypercarbia (HCC) 03/22/2015  . Slow transit constipation 01/07/2015  . Normocytic anemia 11/27/2014  . Essential hypertension 11/27/2014  . Leukocytosis 11/26/2014  . Physical deconditioning 07/01/2014  . Smoking 07/01/2014  . Pulmonary infiltrate in left lung on chest x-ray 04/16/2014  . DM type 2 (diabetes mellitus, type 2) (HCC) 04/16/2014  . Chronic respiratory failure (HCC) 04/16/2014  . Vitamin D deficiency disease 04/16/2014  . Pulmonary infiltrate 04/16/2014  . Esophageal dysmotility 02/27/2014  . HCAP (healthcare-associated pneumonia) 02/21/2014  . Healthcare-associated pneumonia 02/21/2014  . Abdominal pain, epigastric 01/09/2014  . Chronic diastolic congestive heart failure (HCC) 01/07/2014  . Malnutrition of moderate degree (HCC)  01/03/2014  . Acute on chronic respiratory failure (HCC) 01/03/2014  . COPD with acute exacerbation (HCC) 01/01/2014  . Hyponatremia 04/09/2012    Class: Acute  . Diabetes mellitus type 2, noninsulin dependent (HCC) 04/06/2012  . COPD exacerbation (HCC) 12/09/2011  . Preop pulmonary/respiratory exam 02/28/2011  . HTN (hypertension) 05/12/2010  . Hyperlipidemia 05/12/2010  . Severe chronic obstructive pulmonary disease (HCC) 04/29/2010  . Chest pain 04/29/2010    Orientation RESPIRATION BLADDER Height & Weight     Self, Time  O2 (Ruth 1.5L) Continent Weight: 119 lb 11.2 oz (54.3 kg) Height:  5\' 4"  (162.6 cm)  BEHAVIORAL SYMPTOMS/MOOD NEUROLOGICAL BOWEL NUTRITION STATUS      Continent Diet  AMBULATORY STATUS COMMUNICATION OF NEEDS Skin   Limited Assist Verbally Normal                       Personal Care Assistance Level of Assistance  Bathing, Dressing Bathing Assistance: Limited assistance   Dressing Assistance: Limited assistance     Functional Limitations Info             SPECIAL CARE FACTORS FREQUENCY  PT (By licensed PT), OT (By licensed OT)     PT Frequency: 5/wk OT Frequency: 5/wk            Contractures      Additional Factors Info  Code Status, Allergies, Insulin Sliding Scale Code Status Info: Partial Allergies Info: Brovana Arformoterol Tartrate, Budesonide, Mucomyst Acetylcysteine   Insulin Sliding Scale Info: 6/day       Current Medications (11/28/2015):  This is the current hospital active medication list Current Facility-Administered Medications  Medication Dose Route Frequency Provider Last Rate Last Dose  . albuterol (  PROVENTIL) (2.5 MG/3ML) 0.083% nebulizer solution 2.5 mg  2.5 mg Nebulization Q2H PRN Duayne CalPaul W Hoffman, NP      . ALPRAZolam Prudy Feeler(XANAX) tablet 0.5 mg  0.5 mg Oral BID PRN Jeanella CrazeBrandi L Ollis, NP   0.5 mg at 11/28/15 0903  . chlorhexidine (PERIDEX) 0.12 % solution 15 mL  15 mL Mouth Rinse BID Richarda OverlieNayana Abrol, MD   15 mL at 11/28/15  0903  . enoxaparin (LOVENOX) injection 40 mg  40 mg Subcutaneous Daily Michael LitterNikki Carter, MD   40 mg at 11/27/15 1059  . insulin aspart (novoLOG) injection 0-9 Units  0-9 Units Subcutaneous Q4H Michael LitterNikki Carter, MD   Stopped at 11/28/15 0800  . ipratropium-albuterol (DUONEB) 0.5-2.5 (3) MG/3ML nebulizer solution 3 mL  3 mL Nebulization QID Richarda OverlieNayana Abrol, MD   3 mL at 11/28/15 0746  . latanoprost (XALATAN) 0.005 % ophthalmic solution 1 drop  1 drop Both Eyes QHS Michael LitterNikki Carter, MD   1 drop at 11/28/15 0025  . levofloxacin (LEVAQUIN) IVPB 500 mg  500 mg Intravenous Q24H Leatha Gildingostin M Gherghe, MD   500 mg at 11/28/15 0903  . methylPREDNISolone sodium succinate (SOLU-MEDROL) 125 mg/2 mL injection 60 mg  60 mg Intravenous Q12H Leatha Gildingostin M Gherghe, MD   60 mg at 11/28/15 [id number]     Discharge Medications: Please see discharge summary for a list of discharge medications.  Relevant Imaging Results:  Relevant Lab Results:   Additional Information SSN: [id number]  Burna SisUris, Davin Muramoto H, LCSW

## 2015-11-28 NOTE — Progress Notes (Signed)
Patient/family request to leave bed alarm off. Patient family educated will continue to monitor

## 2015-11-28 NOTE — Clinical Social Work Note (Signed)
Clinical Social Work Assessment  Patient Details  Name: Nicole Maxwell MRN: [id number] Date of Birth: 1945-10-07  Date of referral:  11/28/15               Reason for consult:  Facility Placement                Permission sought to share information with:  Facility Medical sales representativeContact Representative, Family Supports Permission granted to share information::  No (disoriented)  Name::     Training and development officerMaria  Agency::  SNF  Relationship::  dtr  Contact Information:     Housing/Transportation Living arrangements for the past 2 months:  Apartment Source of Information:  Adult Children Patient Interpreter Needed:  None Criminal Activity/Legal Involvement Pertinent to Current Situation/Hospitalization:  No - Comment as needed Significant Relationships:  Adult Children Lives with:  Self Do you feel safe going back to the place where you live?  Yes Need for family participation in patient care:  Yes (Comment) (decision making)  Care giving concerns:  Pt with increased weakness and lives alone   Office managerocial Worker assessment / plan: CSW spoke with pt dtr concerning PT recommendation for SNF- CSW explained SNF and SNF referral process- dtr states she is familiar from when pt had previous stay.  Employment status:  Retired Database administratornsurance information:  Managed Medicare PT Recommendations:  Skilled Nursing Facility Information / Referral to community resources:  Skilled Nursing Facility  Patient/Family's Response to care: Pt dtr is agreeable to SNF- pt somewhat confused but dtr to discuss SNF with her and hopeful that pt will be agreeable.  Patient/Family's Understanding of and Emotional Response to Diagnosis, Current Treatment, and Prognosis:  Dtr is hopeful that after SNF stay pt will be stronger and be ready for transition to ALF when she is able to find one she feels comfortable with.  Emotional Assessment Appearance:  Appears stated age Attitude/Demeanor/Rapport:    Affect (typically observed):   Appropriate Orientation:  Oriented to Self, Oriented to Place Alcohol / Substance use:  Not Applicable Psych involvement (Current and /or in the community):  No (Comment)  Discharge Needs  Concerns to be addressed:  Care Coordination Readmission within the last 30 days:  No Current discharge risk:  Physical Impairment Barriers to Discharge:  Continued Medical Work up   Burna SisUris, Carnita Golob H, LCSW 11/28/2015, 1:02 PM

## 2015-11-28 NOTE — Care Management Important Message (Signed)
Important Message  Patient Details  Name: Nicole Maxwell MRN: [id number] Date of Birth: 1945/05/19   Medicare Important Message Given:  Yes    [name], Klarissa Mcilvain Abena 11/28/2015, 10:39 AM

## 2015-11-28 NOTE — Progress Notes (Signed)
PLACED ON BIPAP AT THIS TIME

## 2015-11-28 NOTE — Progress Notes (Signed)
PROGRESS NOTE  Nicole Ibalta S Tolin ZOX:[id number] DOB: February 27, 1945 DOA: 11/24/2015 PCP: [name], William, MD   LOS: 4 days   Brief Narrative: Nicole Maxwell is a 70 y.o. woman with a history of COPD with chronic respiratory failure on O2 2L [place] at baseline, chronic diastolic heart failure, HTN, and DM who lives independently and continues to smoke.  She has a daughter who is her next of kin/POA and speaks to her multiple times daily.  The patient is lethargic and unable to give any meaningful history (though she is oriented to person, place, and year).  History taken from EMR, ED report, and conversation with her daughter by phone.  Her daughter reports that the patient has been complaining of not feeling well for 3-4 days.  She has had a cough productive of sputum that has been thick and clear or yellow.  She had not reported fever prior to today, but she had been complaining for "going from hot to cold".  She has had decreased PO intake in the past two days.  She has had chest tightness, increased wheezing, and progressive shortness of breath.  The patient's daughter asked another family member to go by and check on the patient today.  She was found down without her oxygen on.  Portable pulse ox reportedly showed O2 sats in the low 80's.  911 was called immediately.  The patient received two breathing treatments en route to the ED.  Upon arrival here, she was placed on BiPAP with ABG showing pH 7.33, pCO2 69, pO2 87.  Assessment & Plan: Principal Problem:   Pneumonia Active Problems:   Severe chronic obstructive pulmonary disease (HCC)   Diabetes mellitus type 2, noninsulin dependent (HCC)   Acute on chronic respiratory failure (HCC)   Chronic diastolic congestive heart failure (HCC)   Acute respiratory failure with hypoxia and hypercarbia (HCC)   Tobacco use   Sepsis (HCC)   Respiratory failure (HCC)   Acute on chronic respiratory failure with hypoxia and hypercarbia secondary to LLL pneumonia, COPD  exacerbation, Sepsis without shock - PCCM consulted, appreciate input - Antibiotics changed to Levaquin yesterday, continue - sepsis physiology improved - stable on [place] today - cultures negative - continue steroids, nebulizers - Patient change to prednisone yesterday, however she has more wheezing this morning, she appears mildly confused, will switch her back to IV steroids  - influenza negative  Dysphagia - patient with long standing complaints of dysphagia  - she had a barium swallow on 02/2014 which showed "nonspecific esophageal dysmotility and considered indeterminate for smooth strictures due to the contractions. Given that the barium tablet impacted in the distal esophagus, endoscopy may be warranted for further characterization.". Never had an EGD.  - Repeat barium study on 11/2 shows with slight narrowing in the proximal esophagus at the level of C5-C6, ~50%. Will recommend an EGD as an outpatient once respiratory status is stable  History of DM - Blood glucose checks q4h - Sensitive sliding scale - Holding metformin  Chronic diastolic HF, compensated at this time - Monitor volume status - eating well, d/c fluids  Tobacco abuse - counseled for cessation  GOC - long discussion today with the patient's nephew as well as patient's daughter regarding work to proceed from here, it is clear that home discharge with be unsafe to 2 ongoing tobacco abuse and noncompliance with her BiPAP and very high risk of rehospitalization. Physical therapy did recommend SNF, which patient was initially resistant to however she seems more amenable  right now. Family is in strong agreement that patient would benefit from SNF stay following hospital stay. She is not ready to be discharged today, discussed with SW, she can probably be discharged over the weekend if she is stable. Daughter also mentions that his POA, she would like to reverse her CODE STATUS to full code today   DVT prophylaxis:  Lovenox Code Status: Full code, reversed by daughter on 11/3 Family Communication: nephew and daughter bedside Disposition Plan: SNF when ready    Consultants:   PCCM  Procedures:  None  Antimicrobials:  Vancomycin 10/31 >> 11/2  Zosyn 10/31 >> 11/2  Levaquin 11/2 >>    Subjective: - confused, has no complaints this morning but appears agitated and angry in am   Objective: Vitals:   11/28/15 0522 11/28/15 0748 11/28/15 1209 11/28/15 1427  BP: (!) 158/70   (!) 149/82  Pulse: 99   (!) 108  Resp: 18   17  Temp: 97.9 F (36.6 C)   98.9 F (37.2 C)  TempSrc: Oral   Oral  SpO2: 95% 96% 96% 99%  Weight:      Height:        Intake/Output Summary (Last 24 hours) at 11/28/15 1428 Last data filed at 11/28/15 1327  Gross per 24 hour  Intake              600 ml  Output              600 ml  Net                0 ml   Filed Weights   11/24/15 2134 11/25/15 1724 11/27/15 1548  Weight: 52.2 kg (115 lb) 50.6 kg (111 lb 8.8 oz) 54.3 kg (119 lb 11.2 oz)    Examination: Constitutional: NAD Vitals:   11/28/15 0522 11/28/15 0748 11/28/15 1209 11/28/15 1427  BP: (!) 158/70   (!) 149/82  Pulse: 99   (!) 108  Resp: 18   17  Temp: 97.9 F (36.6 C)   98.9 F (37.2 C)  TempSrc: Oral   Oral  SpO2: 95% 96% 96% 99%  Weight:      Height:       Eyes: PERRL, lids and conjunctivae normal ENMT: Mucous membranes are moist. No oropharyngeal exudates Respiratory: overall decreased breath sounds, + wheezing  Cardiovascular: Regular rate and rhythm, no murmurs / rubs / gallops. No LE edema. Abdomen: no tenderness. Bowel sounds positive.  Skin: no rashes, lesions, ulcers. No induration Neurologic: non focal  Psychiatric:  Alert and oriented x 3 but does appear comfused.    Data Reviewed: I have personally reviewed following labs and imaging studies  CBC:  Recent Labs Lab 11/24/15 2145 11/25/15 0827 11/26/15 0412  WBC 17.0* 21.7* 20.7*  HGB 10.8* 10.1* 9.0*  HCT 37.1 34.6*  30.7*  MCV 83.6 83.2 81.4  PLT 369 336 358   Basic Metabolic Panel:  Recent Labs Lab 11/24/15 2145 11/25/15 0827 11/26/15 0412  NA 139 140 141  K 4.6 4.8 4.1  CL 97* 98* 102  CO2 29 32 28  GLUCOSE 106* 182* 120*  BUN 19 27* 24*  CREATININE 0.88 1.16* 0.83  CALCIUM 9.4 9.3 8.4*   GFR: Estimated Creatinine Clearance: 53.3 mL/min (by C-G formula based on SCr of 0.83 mg/dL). Liver Function Tests:  Recent Labs Lab 11/24/15 2145 11/26/15 0412  AST 22 19  ALT 16 14  ALKPHOS 63 54  BILITOT 0.8 0.6  PROT 6.6  6.0*  ALBUMIN 3.7 2.8*   No results for input(s): LIPASE, AMYLASE in the last 168 hours. No results for input(s): AMMONIA in the last 168 hours. Coagulation Profile:  Recent Labs Lab 11/25/15 0827  INR 0.97   Cardiac Enzymes: No results for input(s): CKTOTAL, CKMB, CKMBINDEX, TROPONINI in the last 168 hours. BNP (last 3 results) No results for input(s): PROBNP in the last 8760 hours. HbA1C: No results for input(s): HGBA1C in the last 72 hours. CBG:  Recent Labs Lab 11/27/15 2109 11/27/15 2357 11/28/15 0524 11/28/15 0800 11/28/15 1213  GLUCAP 184* 137* 100* 88 153*   Lipid Profile: No results for input(s): CHOL, HDL, LDLCALC, TRIG, CHOLHDL, LDLDIRECT in the last 72 hours. Thyroid Function Tests: No results for input(s): TSH, T4TOTAL, FREET4, T3FREE, THYROIDAB in the last 72 hours. Anemia Panel: No results for input(s): VITAMINB12, FOLATE, FERRITIN, TIBC, IRON, RETICCTPCT in the last 72 hours. Urine analysis:    Component Value Date/Time   COLORURINE YELLOW 11/25/2015 0100   APPEARANCEUR CLOUDY (A) 11/25/2015 0100   LABSPEC 1.022 11/25/2015 0100   PHURINE 6.0 11/25/2015 0100   GLUCOSEU NEGATIVE 11/25/2015 0100   HGBUR NEGATIVE 11/25/2015 0100   BILIRUBINUR SMALL (A) 11/25/2015 0100   KETONESUR 15 (A) 11/25/2015 0100   PROTEINUR 100 (A) 11/25/2015 0100   NITRITE NEGATIVE 11/25/2015 0100   LEUKOCYTESUR NEGATIVE 11/25/2015 0100   Sepsis  Labs: Invalid input(s): PROCALCITONIN, LACTICIDVEN  Recent Results (from the past 240 hour(s))  Blood Culture (routine x 2)     Status: None (Preliminary result)   Collection Time: 11/24/15  9:49 PM  Result Value Ref Range Status   Specimen Description BLOOD RIGHT ARM  Final   Special Requests BOTTLES DRAWN AEROBIC AND ANAEROBIC  Final   Culture NO GROWTH 3 DAYS  Final   Report Status PENDING  Incomplete  Blood Culture (routine x 2)     Status: None (Preliminary result)   Collection Time: 11/24/15 10:00 PM  Result Value Ref Range Status   Specimen Description BLOOD RIGHT ARM  Final   Special Requests IN PEDIATRIC BOTTLE  Final   Culture NO GROWTH 3 DAYS  Final   Report Status PENDING  Incomplete  Urine culture     Status: None   Collection Time: 11/25/15  1:00 AM  Result Value Ref Range Status   Specimen Description URINE, RANDOM  Final   Special Requests NONE  Final   Culture NO GROWTH  Final   Report Status 11/26/2015 FINAL  Final  Culture, expectorated sputum-assessment     Status: None   Collection Time: 11/25/15  9:07 AM  Result Value Ref Range Status   Specimen Description EXPECTORATED SPUTUM  Final   Special Requests NONE  Final   Sputum evaluation   Final    MICROSCOPIC FINDINGS SUGGEST THAT THIS SPECIMEN IS NOT REPRESENTATIVE OF LOWER RESPIRATORY SECRETIONS. PLEASE RECOLLECT. Paula Compton RN [id number] 11/25/15 A BROWNING    Report Status 11/25/2015 FINAL  Final  MRSA PCR Screening     Status: None   Collection Time: 11/25/15  5:25 PM  Result Value Ref Range Status   MRSA by PCR NEGATIVE NEGATIVE Final    Comment:        The GeneXpert MRSA Assay (FDA approved for NASAL specimens only), is one component of a comprehensive MRSA colonization surveillance program. It is not intended to diagnose MRSA infection nor to guide or monitor treatment for MRSA infections.       Radiology Studies:  Dg Esophagus  Result Date: 11/27/2015 CLINICAL DATA:   Dysphagia. EXAM: ESOPHOGRAM/BARIUM SWALLOW TECHNIQUE: Single contrast examination was performed using  thin barium. FLUOROSCOPY TIME:  Fluoroscopy Time:  1 minutes 18 seconds Radiation Exposure Index (if provided by the fluoroscopic device): 18.2 mGy Number of Acquired Spot Images: 80 COMPARISON:  None. FINDINGS: Exam is limited by patient in motility. Patient was evaluated the supine at approximately 30 degrees. The proximal esophagus is mildly patulous. There is a region of tortuosity and narrowing proximal esophagus just above the thoracic inlet (C5-C6 level). There is waist like narrowing at this level with approximately 50% luminal narrowing. The distal esophagus is tortuous.  GE junction appears normal. No barium tablet could be administered with current patient positioning. IMPRESSION: 1. Waist like narrowing of the proximal esophagus at the level of C5-C6. There is approximately 50% narrowing of diameter. 2. Tortuosity of the distal esophagus suggests esophageal spasm. 3. The GE junction appears normal. 4. A more thorough exam could be performed when patient gains mobility or consider upper endoscopy. Electronically Signed   By: Genevive BiStewart  Edmunds M.D.   On: 11/27/2015 15:05     Scheduled Meds: . chlorhexidine  15 mL Mouth Rinse BID  . enoxaparin (LOVENOX) injection  40 mg Subcutaneous Daily  . insulin aspart  0-9 Units Subcutaneous Q4H  . ipratropium-albuterol  3 mL Nebulization QID  . latanoprost  1 drop Both Eyes QHS  . [START ON 11/29/2015] levofloxacin  500 mg Oral Daily  . methylPREDNISolone (SOLU-MEDROL) injection  60 mg Intravenous Q12H   Continuous Infusions:    Time spent: 35 minutes, > 50 % bedside involved in GOC, counseling / discussions regarding discharge planning with patient's family.  Pamella Pertostin Derric Dealmeida, MD, PhD [facility] Pager [phone]  If 7PM-7AM, please contact night-coverage [email or web address] Password [email or web address] 11/28/2015, 2:28 PM

## 2015-11-29 LAB — CBC
HEMATOCRIT: 34.5 % — AB (ref 36.0–46.0)
Hemoglobin: 10.2 g/dL — ABNORMAL LOW (ref 12.0–15.0)
MCH: 23.9 pg — AB (ref 26.0–34.0)
MCHC: 29.6 g/dL — ABNORMAL LOW (ref 30.0–36.0)
MCV: 80.8 fL (ref 78.0–100.0)
PLATELETS: 417 10*3/uL — AB (ref 150–400)
RBC: 4.27 MIL/uL (ref 3.87–5.11)
RDW: 17.3 % — AB (ref 11.5–15.5)
WBC: 17 10*3/uL — AB (ref 4.0–10.5)

## 2015-11-29 LAB — GLUCOSE, CAPILLARY
GLUCOSE-CAPILLARY: 99 mg/dL (ref 65–99)
GLUCOSE-CAPILLARY: 200 mg/dL — AB (ref 65–99)
Glucose-Capillary: 84 mg/dL (ref 65–99)
Glucose-Capillary: 79 mg/dL (ref 65–99)
Glucose-Capillary: 258 mg/dL — ABNORMAL HIGH (ref 65–99)

## 2015-11-29 LAB — BASIC METABOLIC PANEL
Anion gap: 8 (ref 5–15)
BUN: 18 mg/dL (ref 6–20)
CHLORIDE: 102 mmol/L (ref 101–111)
CO2: 32 mmol/L (ref 22–32)
CREATININE: 0.66 mg/dL (ref 0.44–1.00)
Calcium: 8.8 mg/dL — ABNORMAL LOW (ref 8.9–10.3)
GFR calc Af Amer: >60 mL/min (ref >60)
GFR calc non Af Amer: >60 mL/min (ref >60)
Glucose, Bld: 85 mg/dL (ref 65–99)
POTASSIUM: 4.1 mmol/L (ref 3.5–5.1)
Sodium: 142 mmol/L (ref 135–145)

## 2015-11-29 LAB — CULTURE, BLOOD (ROUTINE X 2)
CULTURE: NO GROWTH
CULTURE: NO GROWTH

## 2015-11-29 MED ORDER — LORAZEPAM 2 MG/ML IJ SOLN
0.5000 mg | Freq: Once | INTRAMUSCULAR | Status: DC
  Filled 2015-11-29: qty 1

## 2015-11-29 MED ORDER — PREDNISONE 50 MG PO TABS
50.0000 mg | ORAL_TABLET | Freq: Every day | ORAL | Status: DC
  Administered 2015-11-30: 50 mg via ORAL
  Filled 2015-11-29 (×2): qty 1

## 2015-11-29 NOTE — Progress Notes (Signed)
PROGRESS NOTE  Nicole Maxwell NWG:[id number] DOB: 02-25-1945 DOA: 11/24/2015 PCP: [name], William, MD   LOS: 5 days   Brief Narrative: Nicole Maxwell is a [age] y.o. woman with a history of COPD with chronic respiratory failure on O2 2L [place] at baseline, chronic diastolic heart failure, HTN, and DM who lives independently and continues to smoke.  She has a daughter who is her next of kin/POA and speaks to her multiple times daily.  The patient is lethargic and unable to give any meaningful history (though she is oriented to person, place, and year).  History taken from EMR, ED report, and conversation with her daughter by phone.  Her daughter reports that the patient has been complaining of not feeling well for 3-4 days.  She has had a cough productive of sputum that has been thick and clear or yellow.  She had not reported fever prior to today, but she had been complaining for "going from hot to cold".  She has had decreased PO intake in the past two days.  She has had chest tightness, increased wheezing, and progressive shortness of breath.  The patient's daughter asked another family member to go by and check on the patient today.  She was found down without her oxygen on.  Portable pulse ox reportedly showed O2 sats in the low 80's.  911 was called immediately.  The patient received two breathing treatments en route to the ED.  Upon arrival here, she was placed on BiPAP with ABG showing pH 7.33, pCO2 69, pO2 87.  Assessment & Plan: Principal Problem:   Pneumonia Active Problems:   Severe chronic obstructive pulmonary disease (HCC)   Diabetes mellitus type 2, noninsulin dependent (HCC)   Acute on chronic respiratory failure (HCC)   Chronic diastolic congestive heart failure (HCC)   Acute respiratory failure with hypoxia and hypercarbia (HCC)   Tobacco use   Sepsis (HCC)   Respiratory failure (HCC)   Acute on chronic respiratory failure with hypoxia and hypercarbia secondary to LLL pneumonia, COPD  exacerbation, Sepsis without shock - PCCM consulted, appreciate input - Antibiotics changed to Levaquin yesterday, continue - sepsis physiology improved - stable on [place] today - cultures negative - continue steroids, nebulizers - influenza negative - she is improving, change her IV steroids to po prednisone today. D/c SNF tomorrow if she remains stable   Dysphagia - patient with long standing complaints of dysphagia  - she had a barium swallow on 02/2014 which showed "nonspecific esophageal dysmotility and considered indeterminate for smooth strictures due to the contractions. Given that the barium tablet impacted in the distal esophagus, endoscopy may be warranted for further characterization.". Never had an EGD.  - Repeat barium study on 11/2 shows with slight narrowing in the proximal esophagus at the level of C5-C6, ~50%. Will recommend an EGD as an outpatient once respiratory status is stable  History of DM - Blood glucose checks q4h - Sensitive sliding scale - Holding metformin  Chronic diastolic HF, compensated at this time - Monitor volume status - eating well, d/c fluids  Tobacco abuse - counseled for cessation  GOC - long discussion today with the patient's nephew as well as patient's daughter 11/3 regarding work to proceed from here, it is clear that home discharge with be unsafe to 2 ongoing tobacco abuse and noncompliance with her BiPAP and very high risk of rehospitalization. Physical therapy did recommend SNF, which patient was initially resistant to however she seems more amenable right now. Family is  in strong agreement that patient would benefit from SNF stay following hospital stay. She is not ready to be discharged today, discussed with SW, she can probably be discharged over the weekend if she is stable. Daughter also mentions that his POA, she would like to reverse her CODE STATUS to full code today    DVT prophylaxis: Lovenox Code Status: Full code, reversed by  daughter on 11/3 Family Communication: daughter bedside Disposition Plan: SNF 1 day   Consultants:   PCCM  Procedures:  None  Antimicrobials:  Vancomycin 10/31 >> 11/2  Zosyn 10/31 >> 11/2  Levaquin 11/2 >>    Subjective: - confused, has no complaints this morning but appears agitated and angry in am   Objective: Vitals:   11/29/15 0555 11/29/15 0736 11/29/15 1005 11/29/15 1147  BP: (!) 157/88  (!) 128/49   Pulse: 91  86   Resp:   18   Temp:   98.6 F (37 C)   TempSrc:   Oral   SpO2:  98% 100% 96%  Weight:      Height:        Intake/Output Summary (Last 24 hours) at 11/29/15 1211 Last data filed at 11/29/15 0948  Gross per 24 hour  Intake              480 ml  Output              650 ml  Net             -170 ml   Filed Weights   11/24/15 2134 11/25/15 1724 11/27/15 1548  Weight: 52.2 kg (115 lb) 50.6 kg (111 lb 8.8 oz) 54.3 kg (119 lb 11.2 oz)    Examination: Constitutional: NAD Vitals:   11/29/15 0555 11/29/15 0736 11/29/15 1005 11/29/15 1147  BP: (!) 157/88  (!) 128/49   Pulse: 91  86   Resp:   18   Temp:   98.6 F (37 C)   TempSrc:   Oral   SpO2:  98% 100% 96%  Weight:      Height:        ENMT: Mucous membranes are moist. No oropharyngeal exudates Respiratory: overall decreased breath sounds, + wheezing  Cardiovascular: Regular rate and rhythm, no murmurs / rubs / gallops. No LE edema.    Data Reviewed: I have personally reviewed following labs and imaging studies  CBC:  Recent Labs Lab 11/24/15 2145 11/25/15 0827 11/26/15 0412 11/29/15 0705  WBC 17.0* 21.7* 20.7* 17.0*  HGB 10.8* 10.1* 9.0* 10.2*  HCT 37.1 34.6* 30.7* 34.5*  MCV 83.6 83.2 81.4 80.8  PLT 369 336 358 417*   Basic Metabolic Panel:  Recent Labs Lab 11/24/15 2145 11/25/15 0827 11/26/15 0412 11/29/15 0705  NA 139 140 141 142  K 4.6 4.8 4.1 4.1  CL 97* 98* 102 102  CO2 29 32 28 32  GLUCOSE 106* 182* 120* 85  BUN 19 27* 24* 18  CREATININE 0.88 1.16* 0.83  0.66  CALCIUM 9.4 9.3 8.4* 8.8*   GFR: Estimated Creatinine Clearance: 55.3 mL/min (by C-G formula based on SCr of 0.66 mg/dL). Liver Function Tests:  Recent Labs Lab 11/24/15 2145 11/26/15 0412  AST 22 19  ALT 16 14  ALKPHOS 63 54  BILITOT 0.8 0.6  PROT 6.6 6.0*  ALBUMIN 3.7 2.8*   No results for input(s): LIPASE, AMYLASE in the last 168 hours. No results for input(s): AMMONIA in the last 168 hours. Coagulation Profile:  Recent Labs Lab  11/25/15 0827  INR 0.97   Cardiac Enzymes: No results for input(s): CKTOTAL, CKMB, CKMBINDEX, TROPONINI in the last 168 hours. BNP (last 3 results) No results for input(s): PROBNP in the last 8760 hours. HbA1C: No results for input(s): HGBA1C in the last 72 hours. CBG:  Recent Labs Lab 11/28/15 2110 11/28/15 2309 11/29/15 0544 11/29/15 0752 11/29/15 1154  GLUCAP 133* 131* 84 79 99   Lipid Profile: No results for input(s): CHOL, HDL, LDLCALC, TRIG, CHOLHDL, LDLDIRECT in the last 72 hours. Thyroid Function Tests: No results for input(s): TSH, T4TOTAL, FREET4, T3FREE, THYROIDAB in the last 72 hours. Anemia Panel: No results for input(s): VITAMINB12, FOLATE, FERRITIN, TIBC, IRON, RETICCTPCT in the last 72 hours. Urine analysis:    Component Value Date/Time   COLORURINE YELLOW 11/25/2015 0100   APPEARANCEUR CLOUDY (A) 11/25/2015 0100   LABSPEC 1.022 11/25/2015 0100   PHURINE 6.0 11/25/2015 0100   GLUCOSEU NEGATIVE 11/25/2015 0100   HGBUR NEGATIVE 11/25/2015 0100   BILIRUBINUR SMALL (A) 11/25/2015 0100   KETONESUR 15 (A) 11/25/2015 0100   PROTEINUR 100 (A) 11/25/2015 0100   NITRITE NEGATIVE 11/25/2015 0100   LEUKOCYTESUR NEGATIVE 11/25/2015 0100   Sepsis Labs: Invalid input(s): PROCALCITONIN, LACTICIDVEN  Recent Results (from the past 240 hour(s))  Blood Culture (routine x 2)     Status: None   Collection Time: 11/24/15  9:49 PM  Result Value Ref Range Status   Specimen Description BLOOD RIGHT ARM  Final   Special  Requests BOTTLES DRAWN AEROBIC AND ANAEROBIC 5ML  Final   Culture NO GROWTH 5 DAYS  Final   Report Status 11/29/2015 FINAL  Final  Blood Culture (routine x 2)     Status: None   Collection Time: 11/24/15 10:00 PM  Result Value Ref Range Status   Specimen Description BLOOD RIGHT ARM  Final   Special Requests IN PEDIATRIC BOTTLE 3ML  Final   Culture NO GROWTH 5 DAYS  Final   Report Status 11/29/2015 FINAL  Final  Urine culture     Status: None   Collection Time: 11/25/15  1:00 AM  Result Value Ref Range Status   Specimen Description URINE, RANDOM  Final   Special Requests NONE  Final   Culture NO GROWTH  Final   Report Status 11/26/2015 FINAL  Final  Culture, expectorated sputum-assessment     Status: None   Collection Time: 11/25/15  9:07 AM  Result Value Ref Range Status   Specimen Description EXPECTORATED SPUTUM  Final   Special Requests NONE  Final   Sputum evaluation   Final    MICROSCOPIC FINDINGS SUGGEST THAT THIS SPECIMEN IS NOT REPRESENTATIVE OF LOWER RESPIRATORY SECRETIONS. PLEASE RECOLLECT. Paula ComptonCALLED D EVERETTE RN [phone] 11/25/15 A BROWNING    Report Status 11/25/2015 FINAL  Final  MRSA PCR Screening     Status: None   Collection Time: 11/25/15  5:25 PM  Result Value Ref Range Status   MRSA by PCR NEGATIVE NEGATIVE Final    Comment:        The GeneXpert MRSA Assay (FDA approved for NASAL specimens only), is one component of a comprehensive MRSA colonization surveillance program. It is not intended to diagnose MRSA infection nor to guide or monitor treatment for MRSA infections.       Radiology Studies: Dg Esophagus  Result Date: 11/27/2015 CLINICAL DATA:  Dysphagia. EXAM: ESOPHOGRAM/BARIUM SWALLOW TECHNIQUE: Single contrast examination was performed using  thin barium. FLUOROSCOPY TIME:  Fluoroscopy Time:  1 minutes 18 seconds Radiation Exposure Index (  if provided by the fluoroscopic device): 18.2 mGy Number of Acquired Spot Images: 80 COMPARISON:  None. FINDINGS:  Exam is limited by patient in motility. Patient was evaluated the supine at approximately 30 degrees. The proximal esophagus is mildly patulous. There is a region of tortuosity and narrowing proximal esophagus just above the thoracic inlet (C5-C6 level). There is waist like narrowing at this level with approximately 50% luminal narrowing. The distal esophagus is tortuous.  GE junction appears normal. No barium tablet could be administered with current patient positioning. IMPRESSION: 1. Waist like narrowing of the proximal esophagus at the level of C5-C6. There is approximately 50% narrowing of diameter. 2. Tortuosity of the distal esophagus suggests esophageal spasm. 3. The GE junction appears normal. 4. A more thorough exam could be performed when patient gains mobility or consider upper endoscopy. Electronically Signed   By: Genevive Bi M.D.   On: 11/27/2015 15:05     Scheduled Meds: . chlorhexidine  15 mL Mouth Rinse BID  . enoxaparin (LOVENOX) injection  40 mg Subcutaneous Daily  . insulin aspart  0-9 Units Subcutaneous Q4H  . ipratropium-albuterol  3 mL Nebulization QID  . latanoprost  1 drop Both Eyes QHS  . levofloxacin  500 mg Oral Daily  . LORazepam  0.5 mg Intravenous Once  . predniSONE  50 mg Oral Q breakfast   Continuous Infusions:     Pamella Pert, MD, PhD [facility] Pager [phone]  If 7PM-7AM, please contact night-coverage [email or web address] Password [email or web address] 11/29/2015, 12:11 PM

## 2015-11-30 LAB — GLUCOSE, CAPILLARY
GLUCOSE-CAPILLARY: 112 mg/dL — AB (ref 65–99)
GLUCOSE-CAPILLARY: 94 mg/dL (ref 65–99)
Glucose-Capillary: 82 mg/dL (ref 65–99)
Glucose-Capillary: 113 mg/dL — ABNORMAL HIGH (ref 65–99)

## 2015-11-30 MED ORDER — ALPRAZOLAM 2 MG PO TABS: 1.0000 mg | ORAL_TABLET | ORAL | 0 refills | Status: DC

## 2015-11-30 MED ORDER — HYDROCODONE-ACETAMINOPHEN 5-325 MG PO TABS: 1.0000 | ORAL_TABLET | Freq: Four times a day (QID) | ORAL | 0 refills | Status: DC | PRN

## 2015-11-30 MED ORDER — PREDNISONE 20 MG PO TABS: ORAL_TABLET | ORAL | Status: DC

## 2015-11-30 MED ORDER — LEVOFLOXACIN 500 MG PO TABS: 500.0000 mg | ORAL_TABLET | Freq: Every day | ORAL | 0 refills | Status: DC

## 2015-11-30 NOTE — Progress Notes (Signed)
Patient loss IV access during the day. I have attempted  with no success and pt doesn't wish to be stuck again. Dr.Abrol paged and is fine with leaving IV out. Will continue to monitor.

## 2015-11-30 NOTE — Progress Notes (Signed)
NURSING PROGRESS NOTE  Nicole Maxwell [id number] Discharge Data: 11/30/2015 2:40 PM Attending Provider: Leatha Gildingostin M Gherghe, MD WUJ:[id number]:Shaw, Chrissie NoaWilliam, MD     [facility] facility per MD order.  Discussed with the patient the After Visit Summary and all questions fully answered. All IV's discontinued with no bleeding noted. All belongings returned to patient for patient to take home.Pt taken via carelink and report called to facility.Daughter at bedside.   Last Vital Signs:  Blood pressure (!) 150/67, pulse 81, temperature 97.5 F (36.4 C), temperature source Oral, resp. rate 18, height 5\' 4"  (1.626 m), weight 54.3 kg (119 lb 11.2 oz), SpO2 98 %.  Discharge Medication List   Medication List    TAKE these medications   acetaminophen 650 MG CR tablet Commonly known as:  TYLENOL Take 1,300 mg by mouth every 8 (eight) hours as needed for pain.   PROAIR HFA 108 (90 Base) MCG/ACT inhaler Generic drug:  albuterol Inhale 2 puffs into the lungs every 6 (six) hours.   albuterol (2.5 MG/3ML) 0.083% nebulizer solution Commonly known as:  PROVENTIL Take 3 mLs (2.5 mg total) by nebulization every 4 (four) hours as needed for wheezing or shortness of breath. (may take 1 every 4 hours as needed for wheezing/shortness of breath) DX: 496   alprazolam 2 MG tablet Commonly known as:  XANAX Take 0.5 tablets (1 mg total) by mouth See admin instructions. Take 1/2 tablet (1 mg) by mouth during the day and 1 tablet (2 mg) at bedtime What changed:  how much to take   amLODipine 10 MG tablet Commonly known as:  NORVASC Take 1 tablet (10 mg total) by mouth daily.   aspirin 81 MG EC tablet Take 1 tablet (81 mg total) by mouth daily.   atorvastatin 20 MG tablet Commonly known as:  LIPITOR Take 20 mg by mouth daily.   CLEAR EYES TRIPLE ACTION 0.05-0.5-0.6 % Soln Generic drug:  Tetrahydroz-Polyvinyl Al-Povid Place 1 drop into both eyes 3 (three) times daily as needed (dry eyes).    DULoxetine 60 MG capsule Commonly known as:  CYMBALTA Take 60 mg by mouth daily. Reported on 08/01/2015   feeding supplement (GLUCERNA SHAKE) Liqd Take 237 mLs by mouth daily.   fluticasone 50 MCG/ACT nasal spray Commonly known as:  FLONASE Place 1 spray into both nostrils 2 (two) times daily. What changed:  how much to take  when to take this   furosemide 20 MG tablet Commonly known as:  LASIX Take 20 mg by mouth daily.   HYDROcodone-acetaminophen 5-325 MG tablet Commonly known as:  NORCO/VICODIN Take 1 tablet by mouth every 6 (six) hours as needed for moderate pain.   ipratropium-albuterol 0.5-2.5 (3) MG/3ML Soln Commonly known as:  DUONEB Inhale 3 mLs into the lungs every 6 (six) hours as needed.   latanoprost 0.005 % ophthalmic solution Commonly known as:  XALATAN Place 1 drop into both eyes at bedtime.   levofloxacin 500 MG tablet Commonly known as:  LEVAQUIN Take 1 tablet (500 mg total) by mouth daily.   losartan 25 MG tablet Commonly known as:  COZAAR Take 25 mg by mouth daily.   metFORMIN 500 MG tablet Commonly known as:  GLUCOPHAGE TAKE 1 TABLET BY MOUTH TWICE DAILY   multivitamin with minerals Tabs tablet Take 1 tablet by mouth daily.   OXYGEN Inhale 2 L into the lungs continuous.   pantoprazole 40 MG tablet Commonly known as:  PROTONIX TAKE 1 TABLET BY  MOUTH EVERY NIGHT What changed:  See the new instructions.   polyethylene glycol packet Commonly known as:  MIRALAX / GLYCOLAX Take 17 g by mouth daily as needed for mild constipation.   potassium chloride SA 20 MEQ tablet Commonly known as:  K-DUR,KLOR-CON Take 1 tablet (20 mEq total) by mouth daily. Take while on lasix   predniSONE 5 MG tablet Commonly known as:  DELTASONE Take 5 mg by mouth daily with breakfast. What changed:  Another medication with the same name was added. Make sure you understand how and when to take each.   predniSONE 20 MG tablet Commonly known as:  DELTASONE 40  mg daily for 3 days then 30 mg daily for 3 days then 20 mg daily for 3 days then 10 mg dailly for 3 days then resume chronic dose 5 mg daily What changed:  You were already taking a medication with the same name, and this prescription was added. Make sure you understand how and when to take each.   PRESCRIPTION MEDICATION Inhale into the lungs continuous. CPAP

## 2015-11-30 NOTE — Progress Notes (Deleted)
Family is concerned that pt is breathing harder than usual. O2 Sats 93.When trying to apply nasal cannula pt refused. Pt also very fidgety and reaching in the air. Daughter states that pt is not usually like this. Dr.Rai notified of family concerns. Will continue to monitor.

## 2015-11-30 NOTE — Discharge Summary (Signed)
Physician Discharge Summary  Nicole Maxwell WUJ:[id number] DOB: [birth year] DOA: 11/24/2015  PCP: Nicole ClanShaw, William, MD  Admit date: 11/24/2015 Discharge date: 11/30/2015  Admitted From: home Disposition:  SNF  Recommendations for Outpatient Follow-up:  1. Follow up with PCP in 1-2 weeks 2. Continue Levaquin for 5 additional days 3. Continue Prednisone taper  40 mg daily for 3 days then 30 mg daily for 3 days then 20 mg daily for 3 days then 10 mg dailly for 3 days then resume chronic dose 5 mg daily 4. Ensure nightly BiPAP compliance for advanced COPD to avoid re-hospitalizations  Home Health: none Equipment/Devices: none  Discharge Condition: stable CODE STATUS: Full Diet recommendation: regular  HPI: Nicole Abidelta S Bakeris a 70 y.o.woman with a history of COPD with chronic respiratory failure on O2 2L [place] at baseline, chronic diastolic heart failure, HTN, and DM who lives independently and continues to smoke. She has a daughter who is her next of kin/POA and speaks to her multiple times daily. The patient is lethargic and unable to give any meaningful history (though she is oriented to person, place, and year). History taken from EMR, ED report, and conversation with her daughter by phone. Her daughter reports that the patient has been complaining of not feeling well for 3-4 days. She has had a cough productive of sputum that has been thick and clear or yellow. She had not reported fever prior to today, but she had been complaining for "going from hot to cold". She has had decreased PO intake in the past two days. She has had chest tightness, increased wheezing, and progressive shortness of breath. The patient's daughter asked another family member to go by and check on the patient today. She was found down without her oxygen on. Portable pulse ox reportedly showed O2 sats in the low 80's. 911 was called immediately. The patient received two breathing treatments en route to the ED. Upon  arrival here, she was placed on BiPAP with ABG showing pH 7.33, pCO2 69, pO2 87.  Hospital Course: Discharge Diagnoses:  Principal Problem:   Pneumonia Active Problems:   Severe chronic obstructive pulmonary disease (HCC)   Diabetes mellitus type 2, noninsulin dependent (HCC)   Acute on chronic respiratory failure (HCC)   Chronic diastolic congestive heart failure (HCC)   Acute respiratory failure with hypoxia and hypercarbia (HCC)   Tobacco use   Sepsis (HCC)   Respiratory failure (HCC)  Acute on chronic respiratory failure with hypoxia and hypercarbia secondary to LLL pneumonia, COPD exacerbation, Sepsis without shock - PCCM consulted and have evaluated patient while hospitalized. She briefly required BiPAP on admission, was able to be weaned off quickly and stable on [place] during day time and on BiPAP QHS. She was initially on broad spectrum antibiotics, these were changed to Levaquin, tolerating well, continue for 5 additional days to complete a 8 day course. She was on IV steroids, transitioned to prednisone, continue taper as above. Please ensure nightly BiPAP to avoid re-hospitalization.  - influenza negative Dysphagia - patient with long standing complaints of dysphagia  - she had a barium swallow on 02/2014 which showed "nonspecific esophageal dysmotility and considered indeterminate for smooth strictures due to the contractions. Given that the barium tablet impacted in the distal esophagus, endoscopy may be warranted for further characterization.". Never had an EGD.  - Repeat barium study on 11/2 shows with slight narrowing in the proximal esophagus at the level of C5-C6, ~50%. Will recommend an EGD as an outpatient once  respiratory status is stable History of DM - resume home medications Chronic diastolic HF, compensated at this time - Monitor volume status Tobacco abuse - counseled for cessation GOC - patient initially was a "no intubation" however daughter and patient reversed code  status to FULL CODE   Discharge Instructions     Medication List    TAKE these medications   acetaminophen 650 MG CR tablet Commonly known as:  TYLENOL Take 1,300 mg by mouth every 8 (eight) hours as needed for pain.   PROAIR HFA 108 (90 Base) MCG/ACT inhaler Generic drug:  albuterol Inhale 2 puffs into the lungs every 6 (six) hours.   albuterol (2.5 MG/3ML) 0.083% nebulizer solution Commonly known as:  PROVENTIL Take 3 mLs (2.5 mg total) by nebulization every 4 (four) hours as needed for wheezing or shortness of breath. (may take 1 every 4 hours as needed for wheezing/shortness of breath) DX: 496   alprazolam 2 MG tablet Commonly known as:  XANAX Take 0.5 tablets (1 mg total) by mouth See admin instructions. Take 1/2 tablet (1 mg) by mouth during the day and 1 tablet (2 mg) at bedtime What changed:  how much to take   amLODipine 10 MG tablet Commonly known as:  NORVASC Take 1 tablet (10 mg total) by mouth daily.   aspirin 81 MG EC tablet Take 1 tablet (81 mg total) by mouth daily.   atorvastatin 20 MG tablet Commonly known as:  LIPITOR Take 20 mg by mouth daily.   CLEAR EYES TRIPLE ACTION 0.05-0.5-0.6 % Soln Generic drug:  Tetrahydroz-Polyvinyl Al-Povid Place 1 drop into both eyes 3 (three) times daily as needed (dry eyes).   DULoxetine 60 MG capsule Commonly known as:  CYMBALTA Take 60 mg by mouth daily. Reported on 08/01/2015   feeding supplement (GLUCERNA SHAKE) Liqd Take 237 mLs by mouth daily.   fluticasone 50 MCG/ACT nasal spray Commonly known as:  FLONASE Place 1 spray into both nostrils 2 (two) times daily. What changed:  how much to take  when to take this   furosemide 20 MG tablet Commonly known as:  LASIX Take 20 mg by mouth daily.   HYDROcodone-acetaminophen 5-325 MG tablet Commonly known as:  NORCO/VICODIN Take 1 tablet by mouth every 6 (six) hours as needed for moderate pain.   ipratropium-albuterol 0.5-2.5 (3) MG/3ML Soln Commonly known  as:  DUONEB Inhale 3 mLs into the lungs every 6 (six) hours as needed.   latanoprost 0.005 % ophthalmic solution Commonly known as:  XALATAN Place 1 drop into both eyes at bedtime.   levofloxacin 500 MG tablet Commonly known as:  LEVAQUIN Take 1 tablet (500 mg total) by mouth daily.   losartan 25 MG tablet Commonly known as:  COZAAR Take 25 mg by mouth daily.   metFORMIN 500 MG tablet Commonly known as:  GLUCOPHAGE TAKE 1 TABLET BY MOUTH TWICE DAILY   multivitamin with minerals Tabs tablet Take 1 tablet by mouth daily.   OXYGEN Inhale 2 L into the lungs continuous.   pantoprazole 40 MG tablet Commonly known as:  PROTONIX TAKE 1 TABLET BY MOUTH EVERY NIGHT What changed:  See the new instructions.   polyethylene glycol packet Commonly known as:  MIRALAX / GLYCOLAX Take 17 g by mouth daily as needed for mild constipation.   potassium chloride SA 20 MEQ tablet Commonly known as:  K-DUR,KLOR-CON Take 1 tablet (20 mEq total) by mouth daily. Take while on lasix   predniSONE 5 MG tablet Commonly known as:  DELTASONE Take 5 mg by mouth daily with breakfast. What changed:  Another medication with the same name was added. Make sure you understand how and when to take each.   predniSONE 20 MG tablet Commonly known as:  DELTASONE 40 mg daily for 3 days then 30 mg daily for 3 days then 20 mg daily for 3 days then 10 mg dailly for 3 days then resume chronic dose 5 mg daily What changed:  You were already taking a medication with the same name, and this prescription was added. Make sure you understand how and when to take each.   PRESCRIPTION MEDICATION Inhale into the lungs continuous. CPAP      Follow-up Information    Nicole Clan, MD. Schedule an appointment as soon as possible for a visit in 2 week(s).   Specialty:  Internal Medicine Contact information: [address] [place] [address] [phone]          Allergies  Allergen Reactions  . Brovana  [Arformoterol Tartrate] Shortness Of Breath and Cough    General intolerance  . Budesonide Shortness Of Breath and Cough    General intolerance  . Mucomyst [Acetylcysteine] Shortness Of Breath    Consultations:  Pulmonology   Procedures/Studies:  Dg Esophagus  Result Date: 11/27/2015 CLINICAL DATA:  Dysphagia. EXAM: ESOPHOGRAM/BARIUM SWALLOW TECHNIQUE: Single contrast examination was performed using  thin barium. FLUOROSCOPY TIME:  Fluoroscopy Time:  1 minutes 18 seconds Radiation Exposure Index (if provided by the fluoroscopic device): 18.2 mGy Number of Acquired Spot Images: 80 COMPARISON:  None. FINDINGS: Exam is limited by patient in motility. Patient was evaluated the supine at approximately 30 degrees. The proximal esophagus is mildly patulous. There is a region of tortuosity and narrowing proximal esophagus just above the thoracic inlet (C5-C6 level). There is waist like narrowing at this level with approximately 50% luminal narrowing. The distal esophagus is tortuous.  GE junction appears normal. No barium tablet could be administered with current patient positioning. IMPRESSION: 1. Waist like narrowing of the proximal esophagus at the level of C5-C6. There is approximately 50% narrowing of diameter. 2. Tortuosity of the distal esophagus suggests esophageal spasm. 3. The GE junction appears normal. 4. A more thorough exam could be performed when patient gains mobility or consider upper endoscopy. Electronically Signed   By: Genevive Bi M.D.   On: 11/27/2015 15:05   Dg Chest Portable 1 View  Result Date: 11/24/2015 CLINICAL DATA:  Shortness of Breath EXAM: PORTABLE CHEST 1 VIEW COMPARISON:  October 17, 2015 FINDINGS: There is airspace consolidation consistent with pneumonia in the left lower lobe region. The lungs elsewhere clear. Heart size and pulmonary vascularity are normal. No adenopathy. There is atherosclerotic calcification in the aorta. There is calcification in the left  carotid artery. No bone lesions evident. IMPRESSION: Left lower lobe airspace consolidation consistent with pneumonia. Aortic atherosclerosis. There is calcification in the left carotid artery as well. Followup PA and lateral chest radiographs recommended in 3-4 weeks following trial of antibiotic therapy to ensure resolution and exclude underlying malignancy. Electronically Signed   By: Bretta Bang III M.D.   On: 11/24/2015 21:47      Subjective: - no chest pain, shortness of breath, no abdominal pain, nausea or vomiting.   Discharge Exam: Vitals:   11/29/15 2104 11/30/15 0520  BP: (!) 141/59 (!) 150/67  Pulse: 98 81  Resp: 18 18  Temp: 98.5 F (36.9 C) 97.5 F (36.4 C)   Vitals:   11/29/15 2015 11/29/15 2104  11/30/15 0520 11/30/15 0723  BP:  (!) 141/59 (!) 150/67   Pulse: 92 98 81   Resp: 16 18 18    Temp:  98.5 F (36.9 C) 97.5 F (36.4 C)   TempSrc:   Oral   SpO2: 97% 99% 96% 94%  Weight:      Height:        General: Pt is alert, awake, not in acute distress Cardiovascular: RRR, S1/S2 +, no rubs, no gallops Respiratory: minimal wheezing, no rhonchi, overall decreased breath sounds  Abdominal: Soft, NT, ND, bowel sounds + Extremities: no edema, no cyanosis    The results of significant diagnostics from this hospitalization (including imaging, microbiology, ancillary and laboratory) are listed below for reference.     Microbiology: Recent Results (from the past 240 hour(s))  Blood Culture (routine x 2)     Status: None   Collection Time: 11/24/15  9:49 PM  Result Value Ref Range Status   Specimen Description BLOOD RIGHT ARM  Final   Special Requests BOTTLES DRAWN AEROBIC AND ANAEROBIC  Final   Culture NO GROWTH 5 DAYS  Final   Report Status 11/29/2015 FINAL  Final  Blood Culture (routine x 2)     Status: None   Collection Time: 11/24/15 10:00 PM  Result Value Ref Range Status   Specimen Description BLOOD RIGHT ARM  Final   Special Requests IN  PEDIATRIC BOTTLE  Final   Culture NO GROWTH 5 DAYS  Final   Report Status 11/29/2015 FINAL  Final  Urine culture     Status: None   Collection Time: 11/25/15  1:00 AM  Result Value Ref Range Status   Specimen Description URINE, RANDOM  Final   Special Requests NONE  Final   Culture NO GROWTH  Final   Report Status 11/26/2015 FINAL  Final  Culture, expectorated sputum-assessment     Status: None   Collection Time: 11/25/15  9:07 AM  Result Value Ref Range Status   Specimen Description EXPECTORATED SPUTUM  Final   Special Requests NONE  Final   Sputum evaluation   Final    MICROSCOPIC FINDINGS SUGGEST THAT THIS SPECIMEN IS NOT REPRESENTATIVE OF LOWER RESPIRATORY SECRETIONS. PLEASE RECOLLECT. Paula Compton RN [phone] 11/25/15 A BROWNING    Report Status 11/25/2015 FINAL  Final  MRSA PCR Screening     Status: None   Collection Time: 11/25/15  5:25 PM  Result Value Ref Range Status   MRSA by PCR NEGATIVE NEGATIVE Final    Comment:        The GeneXpert MRSA Assay (FDA approved for NASAL specimens only), is one component of a comprehensive MRSA colonization surveillance program. It is not intended to diagnose MRSA infection nor to guide or monitor treatment for MRSA infections.      Labs: BNP (last 3 results)  Recent Labs  06/30/15 1450 10/17/15 1211 11/24/15 2145  BNP 133.7* 115.5* 66.2   Basic Metabolic Panel:  Recent Labs Lab 11/24/15 2145 11/25/15 0827 11/26/15 0412 11/29/15 0705  NA 139 140 141 142  K 4.6 4.8 4.1 4.1  CL 97* 98* 102 102  CO2 29 32 28 32  GLUCOSE 106* 182* 120* 85  BUN 19 27* 24* 18  CREATININE 0.88 1.16* 0.83 0.66  CALCIUM 9.4 9.3 8.4* 8.8*   Liver Function Tests:  Recent Labs Lab 11/24/15 2145 11/26/15 0412  AST 22 19  ALT 16 14  ALKPHOS 63 54  BILITOT 0.8 0.6  PROT 6.6 6.0*  ALBUMIN 3.7 2.8*   No results for input(s): LIPASE, AMYLASE in the last 168 hours. No results for input(s): AMMONIA in the last 168  hours. CBC:  Recent Labs Lab 11/24/15 2145 11/25/15 0827 11/26/15 0412 11/29/15 0705  WBC 17.0* 21.7* 20.7* 17.0*  HGB 10.8* 10.1* 9.0* 10.2*  HCT 37.1 34.6* 30.7* 34.5*  MCV 83.6 83.2 81.4 80.8  PLT 369 336 358 417*   Cardiac Enzymes: No results for input(s): CKTOTAL, CKMB, CKMBINDEX, TROPONINI in the last 168 hours. BNP: Invalid input(s): POCBNP CBG:  Recent Labs Lab 11/29/15 1622 11/29/15 2002 11/30/15 0010 11/30/15 0404 11/30/15 0751  GLUCAP 200* 258* 112* 82 94   D-Dimer No results for input(s): DDIMER in the last 72 hours. Hgb A1c No results for input(s): HGBA1C in the last 72 hours. Lipid Profile No results for input(s): CHOL, HDL, LDLCALC, TRIG, CHOLHDL, LDLDIRECT in the last 72 hours. Thyroid function studies No results for input(s): TSH, T4TOTAL, T3FREE, THYROIDAB in the last 72 hours.  Invalid input(s): FREET3 Anemia work up No results for input(s): VITAMINB12, FOLATE, FERRITIN, TIBC, IRON, RETICCTPCT in the last 72 hours. Urinalysis    Component Value Date/Time   COLORURINE YELLOW 11/25/2015 0100   APPEARANCEUR CLOUDY (A) 11/25/2015 0100   LABSPEC 1.022 11/25/2015 0100   PHURINE 6.0 11/25/2015 0100   GLUCOSEU NEGATIVE 11/25/2015 0100   HGBUR NEGATIVE 11/25/2015 0100   BILIRUBINUR SMALL (A) 11/25/2015 0100   KETONESUR 15 (A) 11/25/2015 0100   PROTEINUR 100 (A) 11/25/2015 0100   NITRITE NEGATIVE 11/25/2015 0100   LEUKOCYTESUR NEGATIVE 11/25/2015 0100   Sepsis Labs Invalid input(s): PROCALCITONIN,  WBC,  LACTICIDVEN Microbiology Recent Results (from the past 240 hour(s))  Blood Culture (routine x 2)     Status: None   Collection Time: 11/24/15  9:49 PM  Result Value Ref Range Status   Specimen Description BLOOD RIGHT ARM  Final   Special Requests BOTTLES DRAWN AEROBIC AND ANAEROBIC 5ML  Final   Culture NO GROWTH 5 DAYS  Final   Report Status 11/29/2015 FINAL  Final  Blood Culture (routine x 2)     Status: None   Collection Time: 11/24/15  10:00 PM  Result Value Ref Range Status   Specimen Description BLOOD RIGHT ARM  Final   Special Requests IN PEDIATRIC BOTTLE 3ML  Final   Culture NO GROWTH 5 DAYS  Final   Report Status 11/29/2015 FINAL  Final  Urine culture     Status: None   Collection Time: 11/25/15  1:00 AM  Result Value Ref Range Status   Specimen Description URINE, RANDOM  Final   Special Requests NONE  Final   Culture NO GROWTH  Final   Report Status 11/26/2015 FINAL  Final  Culture, expectorated sputum-assessment     Status: None   Collection Time: 11/25/15  9:07 AM  Result Value Ref Range Status   Specimen Description EXPECTORATED SPUTUM  Final   Special Requests NONE  Final   Sputum evaluation   Final    MICROSCOPIC FINDINGS SUGGEST THAT THIS SPECIMEN IS NOT REPRESENTATIVE OF LOWER RESPIRATORY SECRETIONS. PLEASE RECOLLECT. Paula ComptonCALLED D EVERETTE RN [phone] 11/25/15 A BROWNING    Report Status 11/25/2015 FINAL  Final  MRSA PCR Screening     Status: None   Collection Time: 11/25/15  5:25 PM  Result Value Ref Range Status   MRSA by PCR NEGATIVE NEGATIVE Final    Comment:        The GeneXpert MRSA Assay (FDA approved for NASAL  specimens only), is one component of a comprehensive MRSA colonization surveillance program. It is not intended to diagnose MRSA infection nor to guide or monitor treatment for MRSA infections.      Time coordinating discharge: Over 30 minutes  SIGNED:  Pamella Pert, MD  [facility] 11/30/2015, 9:16 AM Pager [phone]  If 7PM-7AM, please contact night-coverage [email or web address] Password [email or web address]

## 2015-11-30 NOTE — Clinical Social Work Placement (Signed)
   CLINICAL SOCIAL WORK PLACEMENT  NOTE  Date:  11/30/2015  Patient Details  Name: Nicole Maxwell MRN: [id number] Date of Birth: Jun 23, 1945  Clinical Social Work is seeking post-discharge placement for this patient at the Skilled  Nursing Facility level of care (*CSW will initial, date and re-position this form in  chart as items are completed):  Yes   Patient/family provided with [facility] Clinical Social Work Department's list of facilities offering this level of care within the geographic area requested by the patient (or if unable, by the patient's family).  Yes   Patient/family informed of their freedom to choose among providers that offer the needed level of care, that participate in Medicare, Medicaid or managed care program needed by the patient, have an available bed and are willing to accept the patient.       Yes   Patient/family informed of [facility]'s ownership interest in [facility] and [facility], as well as of the fact that they are under no obligation to receive care at these facilities.  PASRR submitted to EDS on       PASRR number received on       Existing PASRR number confirmed on 11/28/15     FL2 transmitted to all facilities in geographic area requested by pt/family on 11/28/15     FL2 transmitted to all facilities within larger geographic area on       Patient informed that his/her managed care company has contracts with or will negotiate with certain facilities, including the following:        Yes   Patient/family informed of bed offers received.  [name]     Physician recommends and patient chooses bed at      [name].  [facility]     Patient family notified on 11/30/15 of transfer.  Name of family member notified:  Dtr @ bedside     PHYSICIAN Please sign FL2         Additional Comment:     _______________________________________________ Norlene DuelBROWN, Janelie Goltz B, LCSWA 11/30/2015, 1:48 PM

## 2015-11-30 NOTE — Progress Notes (Signed)
CSW met with pt @ bedside to discuss transfer to [name], pt agreeable to go to SNF. CSW contacted family, dtr [name] @ [phone], no answer, CSW left contact information. CSW will get all required documents to facility for transfer and set up transportation.   B. , LCSWA [phone] 

## 2015-12-01 ENCOUNTER — Other Ambulatory Visit: Payer: Self-pay | Admitting: *Deleted

## 2015-12-01 ENCOUNTER — Non-Acute Institutional Stay (SKILLED_NURSING_FACILITY): Payer: Medicare Other | Admitting: Adult Health

## 2015-12-01 ENCOUNTER — Encounter: Payer: Self-pay | Admitting: Adult Health

## 2015-12-01 DIAGNOSIS — F32A Depression, unspecified: Secondary | ICD-10-CM

## 2015-12-01 DIAGNOSIS — J309 Allergic rhinitis, unspecified: Secondary | ICD-10-CM | POA: Diagnosis not present

## 2015-12-01 DIAGNOSIS — E785 Hyperlipidemia, unspecified: Secondary | ICD-10-CM

## 2015-12-01 DIAGNOSIS — F419 Anxiety disorder, unspecified: Secondary | ICD-10-CM

## 2015-12-01 DIAGNOSIS — J9602 Acute respiratory failure with hypercapnia: Secondary | ICD-10-CM

## 2015-12-01 DIAGNOSIS — I5032 Chronic diastolic (congestive) heart failure: Secondary | ICD-10-CM | POA: Diagnosis not present

## 2015-12-01 DIAGNOSIS — R5381 Other malaise: Secondary | ICD-10-CM | POA: Diagnosis not present

## 2015-12-01 DIAGNOSIS — I10 Essential (primary) hypertension: Secondary | ICD-10-CM | POA: Diagnosis not present

## 2015-12-01 DIAGNOSIS — F329 Major depressive disorder, single episode, unspecified: Secondary | ICD-10-CM

## 2015-12-01 DIAGNOSIS — R1310 Dysphagia, unspecified: Secondary | ICD-10-CM

## 2015-12-01 DIAGNOSIS — E876 Hypokalemia: Secondary | ICD-10-CM

## 2015-12-01 DIAGNOSIS — K5901 Slow transit constipation: Secondary | ICD-10-CM

## 2015-12-01 DIAGNOSIS — Z720 Tobacco use: Secondary | ICD-10-CM | POA: Diagnosis not present

## 2015-12-01 DIAGNOSIS — E8809 Other disorders of plasma-protein metabolism, not elsewhere classified: Secondary | ICD-10-CM

## 2015-12-01 DIAGNOSIS — J9601 Acute respiratory failure with hypoxia: Secondary | ICD-10-CM

## 2015-12-01 DIAGNOSIS — E119 Type 2 diabetes mellitus without complications: Secondary | ICD-10-CM

## 2015-12-01 DIAGNOSIS — D638 Anemia in other chronic diseases classified elsewhere: Secondary | ICD-10-CM

## 2015-12-01 DIAGNOSIS — H409 Unspecified glaucoma: Secondary | ICD-10-CM

## 2015-12-01 DIAGNOSIS — J13 Pneumonia due to Streptococcus pneumoniae: Secondary | ICD-10-CM

## 2015-12-01 DIAGNOSIS — J441 Chronic obstructive pulmonary disease with (acute) exacerbation: Secondary | ICD-10-CM | POA: Diagnosis not present

## 2015-12-01 MED ORDER — HYDROCODONE-ACETAMINOPHEN 5-325 MG PO TABS: 1.0000 | ORAL_TABLET | Freq: Four times a day (QID) | ORAL | 0 refills | Status: DC | PRN

## 2015-12-01 MED ORDER — ALPRAZOLAM 2 MG PO TABS: ORAL_TABLET | ORAL | 0 refills | Status: DC

## 2015-12-01 NOTE — Telephone Encounter (Signed)
[facility] #[phone] Fax: [phone] 

## 2015-12-01 NOTE — Progress Notes (Signed)
Patient ID: Nicole Maxwell, female   DOB: 12-19-1945, [age] y.o.   MRN: [id number]    DATE:  12/01/2015   MRN:  [id number]  BIRTHDAY: 12-19-1945  Facility:  Nursing Home Location:  [name]  Nursing Home Room Number: 706-P  LEVEL OF CARE:  SNF (31)  Contact Information    Name Relation Home Work Mobile   LargoBelin,[name] Daughter   [phone]   Lurlean NannyBaker,Mekierra Grandaughter [phone]     Alan MulderJones,Toyka Friend   [phone]       Code Status History    Date Active Date Inactive Code Status Order ID Comments User Context   11/28/2015  2:22 PM 11/30/2015  5:53 PM Full Code [id number]  Leatha Gildingostin M Gherghe, MD Inpatient   11/26/2015  3:18 PM 11/28/2015  2:22 PM Partial Code [id number]  Leatha Gildingostin M Gherghe, MD Inpatient   11/25/2015  7:36 AM 11/26/2015  3:18 PM Full Code [id number]  Michael LitterNikki Carter, MD ED   11/25/2015  1:59 AM 11/25/2015  7:36 AM Partial Code [id number]  Duayne CalPaul W Hoffman, NP ED   10/17/2015  4:29 PM 10/24/2015  6:43 PM Partial Code [id number]  Meredith PelPaula M Guenther, NP ED   09/10/2015  8:24 AM 09/16/2015  5:28 PM Full Code [id number]  Ozella Rocksavid J Merrell, MD ED   06/30/2015  4:46 PM 07/06/2015  9:44 PM DNR [id number]  Gwenyth BenderKaren M Black, NP ED   06/30/2015  4:36 PM 06/30/2015  4:46 PM Full Code [id number]  Gwenyth BenderKaren M Black, NP ED   03/15/2015  5:53 PM 03/25/2015  6:28 PM Full Code [id number]  Oretha Milchakesh V Alva, MD ED   11/26/2014 12:17 PM 12/12/2014  7:22 PM Full Code [id number]  Marinda ElkAbraham Feliz Ortiz, MD Inpatient   04/16/2014  9:23 PM 04/18/2014  5:25 PM Full Code [id number]  Ron ParkerHarvette C Jenkins, MD Inpatient   02/21/2014  6:47 PM 02/27/2014  2:24 PM Full Code [id number]  Martha ClanWilliam Shaw, MD Inpatient       Chief Complaint  Patient presents with  . Hospitalization Follow-up    HISTORY OF PRESENT ILLNESS :  This is a 70 year old female who has PMH of COPD with chronic respiratory failure on O2 @ 2L/min, chronic diastolic heart failure, hypertension and DM. [facility] to 11/30/15. She has been hospitalized for acute on chronic respiratory failure  She briefly required BiPAP on admission. CXR showed LLL PNA. She was initially on broad spectrum antibiotics then changed to Levaquin and will continue X 5 more days to complete 8-day course. She was put on IV steroids then transitioned to PO Prednisone which is being tapered down.  She was seen in her room today. She was noted to be slightly anxious. She said that she is not smoking at present but would smoke if given the chance. Discussed with her smoking cessation but said that "if I cant then I am not smoking."  She has been admitted for a short-term rehabilitation.   PAST MEDICAL HISTORY:  Past Medical History:  Diagnosis Date  . Alcohol abuse, in remission quit 2004  . Anemia of chronic disease   . Anxiety    Xanax prn  . Arthritis    "my whole body"  . Asthma   . Chronic back pain    "goes from the lower part of my back on down my legs"  (04/17/2014)  . Chronic respiratory failure with hypercapnia (HCC)   . COPD (chronic  obstructive pulmonary disease) (HCC)    cxr 04/03/2010 - hyperinflation  . COPD exacerbation (HCC)   . Dysphagia   . Esophageal dysmotility   . HCAP (healthcare-associated pneumonia)   . Hyperlipidemia   . Hypertension   . Leukocytosis   . Neuromuscular disorder (HCC)    lumbar disc  . On home oxygen therapy    "2L; suppose to be 24/7" (04/17/2014)  . Osteopenia   . Physical deconditioning   . Pneumonia    "3 times in the last month or so" (04/17/2014)  . Protein calorie malnutrition (HCC)   . PVD (peripheral vascular disease) (HCC)   . Shortness of breath    with anxiety  and activty  . Tobacco abuse    " I am addicted"   . Type II diabetes mellitus (HCC)   . Vitamin D deficiency disease    03/26/2010 - 9.9ng/mL. Started on replacemnt     CURRENT MEDICATIONS: Reviewed  Patient's Medications  New Prescriptions   No medications on file   Previous Medications   ACETAMINOPHEN (TYLENOL) 650 MG CR TABLET    Take 650 mg by mouth every 8 (eight) hours as needed for pain.    ALBUTEROL (PROAIR HFA) 108 (90 BASE) MCG/ACT INHALER    Inhale 2 puffs into the lungs every 6 (six) hours.   ALBUTEROL (PROVENTIL) (2.5 MG/3ML) 0.083% NEBULIZER SOLUTION    Take 3 mLs (2.5 mg total) by nebulization every 4 (four) hours as needed for wheezing or shortness of breath. (may take 1 every 4 hours as needed for wheezing/shortness of breath) DX: 496   ALPRAZOLAM (XANAX) 2 MG TABLET    Take 1/2 tablet by mouth once daily; Take 1 tablet by mouth every night at bedtime   AMLODIPINE (NORVASC) 10 MG TABLET    Take 1 tablet (10 mg total) by mouth daily.   ASPIRIN EC 81 MG EC TABLET    Take 1 tablet (81 mg total) by mouth daily.   ATORVASTATIN (LIPITOR) 20 MG TABLET    Take 20 mg by mouth daily.    DULOXETINE (CYMBALTA) 60 MG CAPSULE    Take 60 mg by mouth daily. Reported on 08/01/2015   FLUTICASONE (FLONASE) 50 MCG/ACT NASAL SPRAY    Place 1 spray into both nostrils 2 (two) times daily.   FUROSEMIDE (LASIX) 20 MG TABLET    Take 20 mg by mouth daily.    HYDROCODONE-ACETAMINOPHEN (NORCO/VICODIN) 5-325 MG TABLET    Take 1 tablet by mouth every 6 (six) hours as needed for moderate pain. Do not exceed 4gm of Tylenol in 24 hours   IPRATROPIUM-ALBUTEROL (DUONEB) 0.5-2.5 (3) MG/3ML SOLN    Inhale 3 mLs into the lungs every 6 (six) hours as needed.    LATANOPROST (XALATAN) 0.005 % OPHTHALMIC SOLUTION    Place 1 drop into both eyes at bedtime.   LEVOFLOXACIN (LEVAQUIN) 500 MG TABLET    Take 1 tablet (500 mg total) by mouth daily.   LOSARTAN (COZAAR) 25 MG TABLET    Take 25 mg by mouth daily.   METFORMIN (GLUCOPHAGE) 500 MG TABLET    TAKE 1 TABLET BY MOUTH TWICE DAILY   MULTIPLE VITAMIN (MULTIVITAMIN WITH MINERALS) TABS TABLET    Take 1 tablet by mouth daily.   OXYGEN    Inhale 2 L into the lungs continuous.   PANTOPRAZOLE (PROTONIX) 40 MG TABLET    TAKE 1 TABLET BY MOUTH  EVERY NIGHT   POLYETHYLENE GLYCOL (MIRALAX / GLYCOLAX) PACKET  Take 17 g by mouth daily as needed for mild constipation.   POTASSIUM CHLORIDE SA (K-DUR,KLOR-CON) 20 MEQ TABLET    Take 1 tablet (20 mEq total) by mouth daily. Take while on lasix   PREDNISONE (DELTASONE) 20 MG TABLET    40 mg daily for 3 days then 30 mg daily for 3 days then 20 mg daily for 3 days then 10 mg dailly for 3 days then resume chronic dose 5 mg daily   PREDNISONE (DELTASONE) 5 MG TABLET    Take 5 mg by mouth daily with breakfast.   PRESCRIPTION MEDICATION    Inhale into the lungs continuous. CPAP   TETRAHYDROZ-POLYVINYL AL-POVID (CLEAR EYES TRIPLE ACTION) 0.05-0.5-0.6 % SOLN    Place 1 drop into both eyes 3 (three) times daily as needed (dry eyes).  Modified Medications   No medications on file  Discontinued Medications   FEEDING SUPPLEMENT, GLUCERNA SHAKE, (GLUCERNA SHAKE) LIQD    Take 237 mLs by mouth daily.      Allergies  Allergen Reactions  . Brovana [Arformoterol Tartrate] Shortness Of Breath and Cough    General intolerance  . Budesonide Shortness Of Breath and Cough    General intolerance  . Mucomyst [Acetylcysteine] Shortness Of Breath     REVIEW OF SYSTEMS:  GENERAL: no change in appetite, no fatigue, no weight changes, no fever, chills or weakness EYES: Denies change in vision, dry eyes, eye pain, itching or discharge EARS: Denies change in hearing, ringing in ears, or earache NOSE: Denies nasal congestion or epistaxis MOUTH and THROAT: Denies oral discomfort, gingival pain or bleeding, pain from teeth or hoarseness   RESPIRATORY: no cough, DOE, hemoptysis CARDIAC: no chest pain, or palpitations GI: no abdominal pain, diarrhea, constipation, heart burn, nausea or vomiting GU: Denies dysuria, frequency, hematuria, incontinence, or discharge PSYCHIATRIC: Denies feeling of depression or anxiety. No report of hallucinations, insomnia, paranoia, or agitation   PHYSICAL EXAMINATION  GENERAL  APPEARANCE: Well nourished. In no acute distress. Normal body habitus SKIN:  Skin is warm and dry.  HEAD: Normal in size and contour. No evidence of trauma EYES: Lids open and close normally. No blepharitis, entropion or ectropion. PERRL. Conjunctivae are clear and sclerae are white. Lenses are without opacity EARS: Pinnae are normal. Patient hears normal voice tunes of the examiner MOUTH and THROAT: Lips are without lesions. Oral mucosa is moist and without lesions. Tongue is normal in shape, size, and color and without lesions NECK: supple, trachea midline, no neck masses, no thyroid tenderness, no thyromegaly LYMPHATICS: no LAN in the neck, no supraclavicular LAN RESPIRATORY: breathing is even & unlabored, rhonchi and wheezing on bilateral upper lung fields; on O2 @ 2L/min via [place] continuously CARDIAC: RRR, no murmur,no extra heart sounds, BLE 1+ edema GI: abdomen soft, normal BS, no masses, no tenderness, no hepatomegaly, no splenomegaly EXTREMITIES:  Able to move X 4 extremities PSYCHIATRIC: Alert and oriented X 3. Affect and behavior are appropriate  LABS/RADIOLOGY: Labs reviewed: Basic Metabolic Panel:  Recent Labs  14/78/2909/10/10 0345  03/16/15 0343 03/16/15 1944 03/17/15 0332  11/25/15 0827 11/26/15 0412 11/29/15 0705  NA 142  < > 139  --  141  < > 140 141 142  K 3.8  < > 5.1  --  4.9  < > 4.8 4.1 4.1  CL 99*  < > 94*  --  96*  < > 98* 102 102  CO2 32  < > 35*  --  34*  < > 32 28 32  GLUCOSE 124*  < > 154*  --  134*  < > 182* 120* 85  BUN 39*  < > 34*  --  33*  < > 27* 24* 18  CREATININE 1.00  < > 0.77  --  0.80  < > 1.16* 0.83 0.66  CALCIUM 8.6*  < > 8.8*  --  9.0  < > 9.3 8.4* 8.8*  MG 1.8  < > 1.5* 3.8* 3.4*  --   --   --   --   PHOS 2.9  --  3.1  --   --   --   --   --   --   < > = values in this interval not displayed. Liver Function Tests:  Recent Labs  10/17/15 1211 11/24/15 2145 11/26/15 0412  AST 20 22 19   ALT 16 16 14   ALKPHOS 55 63 54  BILITOT 0.2* 0.8 0.6   PROT 6.4* 6.6 6.0*  ALBUMIN 3.5 3.7 2.8*   CBC:  Recent Labs  06/30/15 1450  09/10/15 0545  10/17/15 1211  11/25/15 0827 11/26/15 0412 11/29/15 0705  WBC 17.5*  < > 9.4  < > 16.9*  < > 21.7* 20.7* 17.0*  NEUTROABS 14.3*  --  7.3  --  11.6*  --   --   --   --   HGB 9.5*  < > 9.1*  < > 9.7*  < > 10.1* 9.0* 10.2*  HCT 36.1  < > 31.5*  < > 36.4  < > 34.6* 30.7* 34.5*  MCV 88.9  < > 83.3  < > 91.0  < > 83.2 81.4 80.8  PLT 285  < > 245  < > 402*  < > 336 358 417*  < > = values in this interval not displayed.  Cardiac Enzymes:  Recent Labs  03/15/15 1430  TROPONINI <0.03    CBG:  Recent Labs  11/30/15 0404 11/30/15 0751 11/30/15 1225  GLUCAP 82 94 113*      Dg Esophagus  Result Date: 11/27/2015 CLINICAL DATA:  Dysphagia. EXAM: ESOPHOGRAM/BARIUM SWALLOW TECHNIQUE: Single contrast examination was performed using  thin barium. FLUOROSCOPY TIME:  Fluoroscopy Time:  1 minutes 18 seconds Radiation Exposure Index (if provided by the fluoroscopic device): 18.2 mGy Number of Acquired Spot Images: 80 COMPARISON:  None. FINDINGS: Exam is limited by patient in motility. Patient was evaluated the supine at approximately 30 degrees. The proximal esophagus is mildly patulous. There is a region of tortuosity and narrowing proximal esophagus just above the thoracic inlet (C5-C6 level). There is waist like narrowing at this level with approximately 50% luminal narrowing. The distal esophagus is tortuous.  GE junction appears normal. No barium tablet could be administered with current patient positioning. IMPRESSION: 1. Waist like narrowing of the proximal esophagus at the level of C5-C6. There is approximately 50% narrowing of diameter. 2. Tortuosity of the distal esophagus suggests esophageal spasm. 3. The GE junction appears normal. 4. A more thorough exam could be performed when patient gains mobility or consider upper endoscopy. Electronically Signed   By: Genevive Bi M.D.   On:  11/27/2015 15:05   Dg Chest Portable 1 View  Result Date: 11/24/2015 CLINICAL DATA:  Shortness of Breath EXAM: PORTABLE CHEST 1 VIEW COMPARISON:  October 17, 2015 FINDINGS: There is airspace consolidation consistent with pneumonia in the left lower lobe region. The lungs elsewhere clear. Heart size and pulmonary vascularity are normal. No adenopathy. There is atherosclerotic calcification in the aorta. There is calcification  in the left carotid artery. No bone lesions evident. IMPRESSION: Left lower lobe airspace consolidation consistent with pneumonia. Aortic atherosclerosis. There is calcification in the left carotid artery as well. Followup PA and lateral chest radiographs recommended in 3-4 weeks following trial of antibiotic therapy to ensure resolution and exclude underlying malignancy. Electronically Signed   By: Bretta Bang III M.D.   On: 11/24/2015 21:47    ASSESSMENT/PLAN:  Physical deconditioning - for rehabilitation, PT and OT, for therapeutic strengthening exercises; fall precaution  Acute on chronic respiratory failure with hypoxia and hypercarbia -  Secondary to LLL PNA,  COPD exacerbation and Sepsis ; continue O2 @ 2L/min via [place] continuously and BiPAP @ HS; continue Levaquin and Prednisone taper  PNA - continue Levaquin 500 mg Q D X 5 more days to complete 8-day course  COPD exacerbation - was given IV steroids then transitioned to Prednisone which will be tapered, continue Pro-Air HFA 108 mcg/ACT inhale 2 puffs into lungs Q 6 hours, albuterol neb Q 4 hours PRN and Duoneb Q 6 hours PRN; continue O2 @ 2L/min via [place] continuously  Dysphagia - barium study done on 11/02 showed slight narrowing in the proximal esophagus @ the level of C5-C6 ~ 50%; recommended to have EGD once respiratory status is stable; for SLP evaluation and treatment of swallowing function and safe food consumption; aspiration precaution  Diabetes Mellitus, type 2 - continue Metformin 500 mg 1 tab PO BID;  CBG BID Lab Results  Component Value Date   HGBA1C 6.4 (H) 10/17/2015   Chronic diastolic heart failure - continue Lasix 20 mg 1 tab PO Q D and Losartan 25 mg 1 tab PO Q D; weigh daily and notify MD/NP for weight gain 3 lbs in a day/week  Tobacco use  - counseled regarding smoking cessation  Anxiety - continue Xanax 1 mg PO Q AM and 2 mg Q HS  Hypertension - continue Norvasc 10 mg 1 tab PO Q D and Losartan 25 mg 1 tab PO Q D  Hyperlipidemia - continue Atorvastatin 20 mg 1 tab PO Q D  Allergic rhinitis - continue Flonase 50 mcg/ACT 1 spray into both nostrils BID  Constipation - continue Miralax 17 gm po Q D PRN  Hypoalbuminemia - albumin 2.8; RD consult  Depression - continue Cymbalta 60 mg 1 capsule PO Q D  Glaucoma - continue Xalatan 0.005% 1 gtt to both eyes Q HS  Hypokalemia - continue KCL ER 20 meq 1 tab PO Q D; check BMP Lab Results  Component Value Date   K 4.1 11/29/2015   Anemia of chronic disease - re-check CBC Lab Results  Component Value Date   HGB 10.2 (L) 11/29/2015       Goals of care:  Short-term rehabilitation     Kenard Gower, NP [facility] [phone]

## 2015-12-02 ENCOUNTER — Non-Acute Institutional Stay (SKILLED_NURSING_FACILITY): Payer: Medicare Other | Admitting: Internal Medicine

## 2015-12-02 ENCOUNTER — Encounter: Payer: Self-pay | Admitting: Internal Medicine

## 2015-12-02 DIAGNOSIS — F329 Major depressive disorder, single episode, unspecified: Secondary | ICD-10-CM | POA: Diagnosis not present

## 2015-12-02 DIAGNOSIS — R1310 Dysphagia, unspecified: Secondary | ICD-10-CM | POA: Diagnosis not present

## 2015-12-02 DIAGNOSIS — I5032 Chronic diastolic (congestive) heart failure: Secondary | ICD-10-CM | POA: Diagnosis not present

## 2015-12-02 DIAGNOSIS — J181 Lobar pneumonia, unspecified organism: Secondary | ICD-10-CM | POA: Diagnosis not present

## 2015-12-02 DIAGNOSIS — D638 Anemia in other chronic diseases classified elsewhere: Secondary | ICD-10-CM

## 2015-12-02 DIAGNOSIS — D72828 Other elevated white blood cell count: Secondary | ICD-10-CM

## 2015-12-02 DIAGNOSIS — J9621 Acute and chronic respiratory failure with hypoxia: Secondary | ICD-10-CM

## 2015-12-02 DIAGNOSIS — J189 Pneumonia, unspecified organism: Secondary | ICD-10-CM

## 2015-12-02 DIAGNOSIS — F32A Depression, unspecified: Secondary | ICD-10-CM

## 2015-12-02 DIAGNOSIS — R1319 Other dysphagia: Secondary | ICD-10-CM

## 2015-12-02 DIAGNOSIS — R531 Weakness: Secondary | ICD-10-CM

## 2015-12-02 DIAGNOSIS — J441 Chronic obstructive pulmonary disease with (acute) exacerbation: Secondary | ICD-10-CM | POA: Diagnosis not present

## 2015-12-02 DIAGNOSIS — I10 Essential (primary) hypertension: Secondary | ICD-10-CM | POA: Diagnosis not present

## 2015-12-02 DIAGNOSIS — E119 Type 2 diabetes mellitus without complications: Secondary | ICD-10-CM | POA: Diagnosis not present

## 2015-12-02 NOTE — Progress Notes (Signed)
LOCATION: [name]  PCP: [name], MD   Code Status: Full Code  Goals of care: Advanced Directive information Advanced Directives 11/25/2015  Does patient have an advance directive? No  Type of Advance Directive -  Does patient want to make changes to advanced directive? -  Copy of advanced directive(s) in chart? -  Would patient like information on creating an advanced directive? No - patient declined information  Pre-existing out of facility DNR order (yellow form or pink MOST form) -       Extended Emergency Contact Information Primary Emergency Contact: Belin,[name] Address: [address]          [place], [place] [address] [place] Phone: [phone] Relation: Daughter Secondary Emergency Contact: Martyn Ehrich [place] of [place] Home Phone: [phone] Relation: Grandaughter   Allergies  Allergen Reactions  . Brovana [Arformoterol Tartrate] Shortness Of Breath and Cough    General intolerance  . Budesonide Shortness Of Breath and Cough    General intolerance  . Mucomyst [Acetylcysteine] Shortness Of Breath    Chief Complaint  Patient presents with  . New Admit To SNF    New Admission Visit     HPI:  Patient is a 70 y.o. female seen today for short term rehabilitation post hospital admission from 11/24/2015-11/30/2015 with acute respiratory failure from left lower lobe pneumonia and COPD exacerbation. She was started on IV antibiotic and IV steroids. She has medical history of COPD, chronic respiratory failure on oxygen, chronic diastolic heart failure, diabetes mellitus, hypertension among others. She is seen in her room today.  Review of Systems:  Constitutional: Negative for fever, chills HENT: Negative for headache, congestion, nasal discharge, sore throat, difficulty swallowing.   Eyes: Negative for ouble vision and discharge.  Respiratory: Negative for cough and wheezing. Positive for shortness of breath with  exertion.  Cardiovascular: Negative for chest pain, palpitations, leg swelling.  Gastrointestinal: Negative for heartburn, nausea, vomiting, abdominal pain,. Last bowel movement was today. Genitourinary: Negative for dysuria and flank pain.  Musculoskeletal: Negative for back pain, fall in the facility.  Skin: Negative for itching, rash.  Neurological: Negative for dizziness. Psychiatric/Behavioral: Negative for depression   Past Medical History:  Diagnosis Date  . Alcohol abuse, in remission quit 2004  . Anemia of chronic disease   . Anxiety    Xanax prn  . Arthritis    "my whole body"  . Asthma   . Chronic back pain    "goes from the lower part of my back on down my legs"  (04/17/2014)  . Chronic respiratory failure with hypercapnia (HCC)   . COPD (chronic obstructive pulmonary disease) (HCC)    cxr 04/03/2010 - hyperinflation  . COPD exacerbation (HCC)   . Dysphagia   . Esophageal dysmotility   . HCAP (healthcare-associated pneumonia)   . Hyperlipidemia   . Hypertension   . Leukocytosis   . Neuromuscular disorder (HCC)    lumbar disc  . On home oxygen therapy    "2L; suppose to be 24/7" (04/17/2014)  . Osteopenia   . Physical deconditioning   . Pneumonia    "3 times in the last month or so" (04/17/2014)  . Protein calorie malnutrition (HCC)   . PVD (peripheral vascular disease) (HCC)   . Shortness of breath    with anxiety  and activty  . Tobacco abuse    " I am addicted"   . Type II diabetes mellitus (HCC)   . Vitamin D deficiency  disease    03/26/2010 - 9.9ng/mL. Started on replacemnt   Past Surgical History:  Procedure Laterality Date  . ABDOMINAL HYSTERECTOMY     "partial"  . APPENDECTOMY  1960  . BACK SURGERY    . CARPAL TUNNEL RELEASE Bilateral    CTS repair 2000 (R), 2006 (L)/notes 01/01/2014  . CATARACT EXTRACTION W/ INTRAOCULAR LENS  IMPLANT, BILATERAL Bilateral 2015  . LUMBAR LAMINECTOMY/DECOMPRESSION MICRODISCECTOMY  2005   L4-5/notes 03/06/2010  .  POSTERIOR LUMBAR FUSION  2002  . TONSILLECTOMY  1960   Social History:   reports that she has been smoking Cigarettes.  She has a 14.00 pack-year smoking history. She has never used smokeless tobacco. She reports that she drinks alcohol. She reports that she does not use drugs.  Family History  Problem Relation Age of Onset  . Heart attack Sister 46    CABG  . Breast cancer Sister   . Cancer Brother   . Heart attack Brother   . Hypertension Other     all siblings  . Anesthesia problems Neg Hx     Medications:   Medication List       Accurate as of 12/02/15  3:14 PM. Always use your most recent med list.          acetaminophen 650 MG CR tablet Commonly known as:  TYLENOL Take 650 mg by mouth every 8 (eight) hours as needed for pain.   PROAIR HFA 108 (90 Base) MCG/ACT inhaler Generic drug:  albuterol Inhale 2 puffs into the lungs every 6 (six) hours.   albuterol (2.5 MG/3ML) 0.083% nebulizer solution Commonly known as:  PROVENTIL Take 3 mLs (2.5 mg total) by nebulization every 4 (four) hours as needed for wheezing or shortness of breath. (may take 1 every 4 hours as needed for wheezing/shortness of breath) DX: 496   alprazolam 2 MG tablet Commonly known as:  XANAX Take 1/2 tablet by mouth once daily; Take 1 tablet by mouth every night at bedtime   amLODipine 10 MG tablet Commonly known as:  NORVASC Take 1 tablet (10 mg total) by mouth daily.   aspirin 81 MG EC tablet Take 1 tablet (81 mg total) by mouth daily.   atorvastatin 20 MG tablet Commonly known as:  LIPITOR Take 20 mg by mouth daily.   CLEAR EYES TRIPLE ACTION 0.05-0.5-0.6 % Soln Generic drug:  Tetrahydroz-Polyvinyl Al-Povid Place 1 drop into both eyes 3 (three) times daily as needed (dry eyes).   DULoxetine 60 MG capsule Commonly known as:  CYMBALTA Take 60 mg by mouth daily. Reported on 08/01/2015   feeding supplement (GLUCERNA SHAKE) Liqd Take 237 mLs by mouth daily.   fluticasone 50 MCG/ACT  nasal spray Commonly known as:  FLONASE Place 1 spray into both nostrils 2 (two) times daily.   furosemide 20 MG tablet Commonly known as:  LASIX Take 20 mg by mouth daily.   HYDROcodone-acetaminophen 5-325 MG tablet Commonly known as:  NORCO/VICODIN Take 1 tablet by mouth every 6 (six) hours as needed for moderate pain. Do not exceed 4gm of Tylenol in 24 hours   ipratropium-albuterol 0.5-2.5 (3) MG/3ML Soln Commonly known as:  DUONEB Inhale 3 mLs into the lungs every 6 (six) hours as needed.   latanoprost 0.005 % ophthalmic solution Commonly known as:  XALATAN Place 1 drop into both eyes at bedtime.   levofloxacin 500 MG tablet Commonly known as:  LEVAQUIN Take 1 tablet (500 mg total) by mouth daily.   losartan 25 MG tablet  Commonly known as:  COZAAR Take 25 mg by mouth daily.   metFORMIN 500 MG tablet Commonly known as:  GLUCOPHAGE TAKE 1 TABLET BY MOUTH TWICE DAILY   OXYGEN Inhale 2 L into the lungs continuous.   pantoprazole 40 MG tablet Commonly known as:  PROTONIX TAKE 1 TABLET BY MOUTH EVERY NIGHT   polyethylene glycol packet Commonly known as:  MIRALAX / GLYCOLAX Take 17 g by mouth daily as needed for mild constipation.   potassium chloride SA 20 MEQ tablet Commonly known as:  K-DUR,KLOR-CON Take 1 tablet (20 mEq total) by mouth daily. Take while on lasix   predniSONE 5 MG tablet Commonly known as:  DELTASONE Take 5 mg by mouth daily with breakfast.   predniSONE 20 MG tablet Commonly known as:  DELTASONE 40 mg daily for 3 days then 30 mg daily for 3 days then 20 mg daily for 3 days then 10 mg dailly for 3 days then resume chronic dose 5 mg daily   PRESCRIPTION MEDICATION Inhale into the lungs continuous. CPAP       Immunizations: Immunization History  Administered Date(s) Administered  . Influenza Split 10/26/2010, 10/08/2011  . Influenza, High Dose Seasonal PF 11/14/2014, 09/24/2015  . Influenza-Unspecified 11/25/2013  . PPD Test 12/01/2015    . Pneumococcal Polysaccharide-23 10/26/2010     Physical Exam: Vitals:   12/02/15 1507  BP: (!) 142/79  Pulse: 92  Resp: 20  Temp: 98.9 F (37.2 C)  TempSrc: Oral  SpO2: 99%  Weight: 119 lb 11.2 oz (54.3 kg)  Height: 5\' 4"  (1.626 m)   Body mass index is 20.55 kg/m.  General- elderly female, thin built, in no acute distress Head- normocephalic, atraumatic Nose- no nasal discharge Throat- moist mucus membrane Eyes- PERRLA, EOMI, no pallor, no icterus Neck- no cervical lymphadenopathy Cardiovascular- normal s1,s2, no murmur Respiratory- no rhonchi, no crackles, no use of accessory muscles, on 2 L oxygen by nasal cannula, short of breath in between sentences, expiratory wheezing on auscultation, poor air movement  Abdomen- bowel sounds present, soft, non tender Musculoskeletal- able to move all 4 extremities, trace leg edema, generalized weakness Neurological- alert and oriented to person, place and time Skin- warm and dry Psychiatry- normal mood and affect    Labs reviewed: Basic Metabolic Panel:  Recent Labs  13/08/6509/09/16 0345  03/16/15 0343 03/16/15 1944 03/17/15 0332  11/25/15 0827 11/26/15 0412 11/29/15 0705  NA 142  < > 139  --  141  < > 140 141 142  K 3.8  < > 5.1  --  4.9  < > 4.8 4.1 4.1  CL 99*  < > 94*  --  96*  < > 98* 102 102  CO2 32  < > 35*  --  34*  < > 32 28 32  GLUCOSE 124*  < > 154*  --  134*  < > 182* 120* 85  BUN 39*  < > 34*  --  33*  < > 27* 24* 18  CREATININE 1.00  < > 0.77  --  0.80  < > 1.16* 0.83 0.66  CALCIUM 8.6*  < > 8.8*  --  9.0  < > 9.3 8.4* 8.8*  MG 1.8  < > 1.5* 3.8* 3.4*  --   --   --   --   PHOS 2.9  --  3.1  --   --   --   --   --   --   < > = values in  this interval not displayed. Liver Function Tests:  Recent Labs  10/17/15 1211 11/24/15 2145 11/26/15 0412  AST 20 22 19   ALT 16 16 14   ALKPHOS 55 63 54  BILITOT 0.2* 0.8 0.6  PROT 6.4* 6.6 6.0*  ALBUMIN 3.5 3.7 2.8*   No results for input(s): LIPASE, AMYLASE in  the last 8760 hours. No results for input(s): AMMONIA in the last 8760 hours. CBC:  Recent Labs  06/30/15 1450  09/10/15 0545  10/17/15 1211  11/25/15 0827 11/26/15 0412 11/29/15 0705  WBC 17.5*  < > 9.4  < > 16.9*  < > 21.7* 20.7* 17.0*  NEUTROABS 14.3*  --  7.3  --  11.6*  --   --   --   --   HGB 9.5*  < > 9.1*  < > 9.7*  < > 10.1* 9.0* 10.2*  HCT 36.1  < > 31.5*  < > 36.4  < > 34.6* 30.7* 34.5*  MCV 88.9  < > 83.3  < > 91.0  < > 83.2 81.4 80.8  PLT 285  < > 245  < > 402*  < > 336 358 417*  < > = values in this interval not displayed. Cardiac Enzymes:  Recent Labs  03/15/15 1430  TROPONINI <0.03   BNP: Invalid input(s): POCBNP CBG:  Recent Labs  11/30/15 0404 11/30/15 0751 11/30/15 1225  GLUCAP 82 94 113*    Radiological Exams: Dg Esophagus  Result Date: 11/27/2015 CLINICAL DATA:  Dysphagia. EXAM: ESOPHOGRAM/BARIUM SWALLOW TECHNIQUE: Single contrast examination was performed using  thin barium. FLUOROSCOPY TIME:  Fluoroscopy Time:  1 minutes 18 seconds Radiation Exposure Index (if provided by the fluoroscopic device): 18.2 mGy Number of Acquired Spot Images: 80 COMPARISON:  None. FINDINGS: Exam is limited by patient in motility. Patient was evaluated the supine at approximately 30 degrees. The proximal esophagus is mildly patulous. There is a region of tortuosity and narrowing proximal esophagus just above the thoracic inlet (C5-C6 level). There is waist like narrowing at this level with approximately 50% luminal narrowing. The distal esophagus is tortuous.  GE junction appears normal. No barium tablet could be administered with current patient positioning. IMPRESSION: 1. Waist like narrowing of the proximal esophagus at the level of C5-C6. There is approximately 50% narrowing of diameter. 2. Tortuosity of the distal esophagus suggests esophageal spasm. 3. The GE junction appears normal. 4. A more thorough exam could be performed when patient gains mobility or consider  upper endoscopy. Electronically Signed   By: Genevive Bi M.D.   On: 11/27/2015 15:05   Dg Chest Portable 1 View  Result Date: 11/24/2015 CLINICAL DATA:  Shortness of Breath EXAM: PORTABLE CHEST 1 VIEW COMPARISON:  October 17, 2015 FINDINGS: There is airspace consolidation consistent with pneumonia in the left lower lobe region. The lungs elsewhere clear. Heart size and pulmonary vascularity are normal. No adenopathy. There is atherosclerotic calcification in the aorta. There is calcification in the left carotid artery. No bone lesions evident. IMPRESSION: Left lower lobe airspace consolidation consistent with pneumonia. Aortic atherosclerosis. There is calcification in the left carotid artery as well. Followup PA and lateral chest radiographs recommended in 3-4 weeks following trial of antibiotic therapy to ensure resolution and exclude underlying malignancy. Electronically Signed   By: Bretta Bang III M.D.   On: 11/24/2015 21:47    Assessment/Plan  Generalized weakness From physical deconditioning. Will have her to work with physical therapy and occupational therapy team to help regain her strength and balance.  Acute on chronic respiratory failure From pneumonia and COPD exacerbation. Breathing has currently improved compared to the hospital per patient. Continue oxygen by nasal cannula and CPAP machine at nighttime. Continue her bronchodilator treatment.  COPD exacerbation Continue and complete her course of prednisone on 12/12/2015 and antibiotic on 12/04/2015. Continue her bronchodilator treatment.  Left lower lobe pneumonia  continue and complete her course of Levaquin 500 mg every 24 hours on 12/04/2015. Encouraged to use incentive spirometer meter. Continue oxygen by nasal cannula. Monitor her breathing status.  Leukocytosis Likely from her use of prednisone and being treated for infection. Monitor WBC curve.  Dysphagia SLP to evaluate. Aspiration precautions to be  taken. Patient has slight narrowing in the proximal esophagus noted on barium swallow study. Plan is to have EGD done post rehabilitation.  Anemia of chronic disease Monitor CBC  Chronic diastolic heart failure Continue losartan and Lasix. Monitor BMP and body weight. Monitor her vital signs. Continue potassium supplement  Type 2 diabetes mellitus Monitor blood sugar reading and continue her metformin. Continue aspirin and losartan.  Hypertension Monitor blood pressure readings. Continue Norvasc, losartan and Lasix. Check BMP.  Chronic depression  continue her current regimen of Cymbalta.      Goals of care: short term rehabilitation   Labs/tests ordered: cbc, bmp  Family/ staff Communication: reviewed care plan with patient and nursing supervisor    Oneal GroutMAHIMA Alexio Sroka, MD Internal Medicine [facility] [address] [place], [place] [address] Cell Phone (Monday-Friday 8 am - 5 pm): [phone] On Call: [phone] and follow prompts after 5 pm and on weekends Office Phone: [phone] Office Fax: [phone]

## 2015-12-03 LAB — BASIC METABOLIC PANEL
BUN: 23 mg/dL — AB (ref 4–21)
Creatinine: 0.7 mg/dL (ref 0.5–1.1)
Glucose: 88 mg/dL
Potassium: 4.7 mmol/L (ref 3.4–5.3)
Sodium: 146 mmol/L (ref 137–147)

## 2015-12-12 ENCOUNTER — Other Ambulatory Visit: Payer: Self-pay | Admitting: Adult Health

## 2015-12-15 ENCOUNTER — Encounter: Payer: Self-pay | Admitting: Adult Health

## 2015-12-15 NOTE — Progress Notes (Signed)
DATE:  12/15/2015 MRN:  [id number]  BIRTHDAY: 1945-07-05  Facility:  Nursing Home Location:  [name]  Nursing Home Room Number: 706-P  LEVEL OF CARE:  SNF (31)  Contact Information    Name Relation Home Work Mobile   Grand DetourBelin,[name] Daughter   [phone]   Lurlean NannyBaker,Mekierra Grandaughter [phone]     Alan MulderJones,Toyka Friend   [phone]       Code Status History    Date Active Date Inactive Code Status Order ID Comments User Context   11/28/2015  2:22 PM 11/30/2015  5:53 PM Full Code [id number]  Leatha Gildingostin M Gherghe, MD Inpatient   11/26/2015  3:18 PM 11/28/2015  2:22 PM Partial Code [id number]  Leatha Gildingostin M Gherghe, MD Inpatient   11/25/2015  7:36 AM 11/26/2015  3:18 PM Full Code [id number]  Michael LitterNikki Carter, MD ED   11/25/2015  1:59 AM 11/25/2015  7:36 AM Partial Code [id number]  Duayne CalPaul W Hoffman, NP ED   10/17/2015  4:29 PM 10/24/2015  6:43 PM Partial Code [id number]  Meredith PelPaula M Guenther, NP ED   09/10/2015  8:24 AM 09/16/2015  5:28 PM Full Code [id number]  Ozella Rocksavid J Merrell, MD ED   06/30/2015  4:46 PM 07/06/2015  9:44 PM DNR [id number]  Gwenyth BenderKaren M Black, NP ED   06/30/2015  4:36 PM 06/30/2015  4:46 PM Full Code [id number]  Gwenyth BenderKaren M Black, NP ED   03/15/2015  5:53 PM 03/25/2015  6:28 PM Full Code [id number]  Oretha Milchakesh V Alva, MD ED   11/26/2014 12:17 PM 12/12/2014  7:22 PM Full Code [id number]  Marinda ElkAbraham Feliz Ortiz, MD Inpatient   04/16/2014  9:23 PM 04/18/2014  5:25 PM Full Code [id number]  Ron ParkerHarvette C Jenkins, MD Inpatient   02/21/2014  6:47 PM 02/27/2014  2:24 PM Full Code [id number]  Martha ClanWilliam Shaw, MD Inpatient       Chief Complaint  Patient presents with  . Discharge Note    HISTORY OF PRESENT ILLNESS:    PAST MEDICAL HISTORY:  Past Medical History:  Diagnosis Date  . Alcohol abuse, in remission quit 2004  . Anemia of chronic disease   . Anxiety    Xanax prn  . Arthritis    "my whole body"  . Asthma   . Chronic back pain    "goes from the lower part of my back on down my legs"  (04/17/2014)  .  Chronic respiratory failure with hypercapnia (HCC)   . COPD (chronic obstructive pulmonary disease) (HCC)    cxr 04/03/2010 - hyperinflation  . COPD exacerbation (HCC)   . Dysphagia   . Esophageal dysmotility   . HCAP (healthcare-associated pneumonia)   . Hyperlipidemia   . Hypertension   . Leukocytosis   . Neuromuscular disorder (HCC)    lumbar disc  . On home oxygen therapy    "2L; suppose to be 24/7" (04/17/2014)  . Osteopenia   . Physical deconditioning   . Pneumonia    "3 times in the last month or so" (04/17/2014)  . Protein calorie malnutrition (HCC)   . PVD (peripheral vascular disease) (HCC)   . Shortness of breath    with anxiety  and activty  . Tobacco abuse    " I am addicted"   . Type II diabetes mellitus (HCC)   . Vitamin D deficiency disease    03/26/2010 - 9.9ng/mL. Started on replacemnt     CURRENT MEDICATIONS: Reviewed  Patient's Medications  New Prescriptions   No medications on file  Previous  Medications   ACETAMINOPHEN (TYLENOL) 650 MG CR TABLET    Take 650 mg by mouth every 8 (eight) hours as needed for pain.    ALBUTEROL (PROAIR HFA) 108 (90 BASE) MCG/ACT INHALER    Inhale 2 puffs into the lungs every 6 (six) hours as needed.    ALBUTEROL (PROVENTIL) (2.5 MG/3ML) 0.083% NEBULIZER SOLUTION    Take 3 mLs (2.5 mg total) by nebulization every 4 (four) hours as needed for wheezing or shortness of breath. (may take 1 every 4 hours as needed for wheezing/shortness of breath) DX: 496   ALPRAZOLAM (XANAX) 2 MG TABLET    Take 1/2 tablet by mouth once daily; Take 1 tablet by mouth every night at bedtime   AMLODIPINE (NORVASC) 10 MG TABLET    Take 1 tablet (10 mg total) by mouth daily.   ASPIRIN EC 81 MG EC TABLET    Take 1 tablet (81 mg total) by mouth daily.   ATORVASTATIN (LIPITOR) 20 MG TABLET    Take 20 mg by mouth daily.    DULOXETINE (CYMBALTA) 60 MG CAPSULE    Take 60 mg by mouth daily. Reported on 08/01/2015   FEEDING SUPPLEMENT, GLUCERNA SHAKE, (GLUCERNA SHAKE)  LIQD    Take 237 mLs by mouth daily.   FLUTICASONE (FLONASE) 50 MCG/ACT NASAL SPRAY    Place 1 spray into both nostrils 2 (two) times daily.   FUROSEMIDE (LASIX) 20 MG TABLET    Take 20 mg by mouth daily.    HYDROCODONE-ACETAMINOPHEN (NORCO/VICODIN) 5-325 MG TABLET    Take 1 tablet by mouth every 6 (six) hours as needed for moderate pain. Do not exceed 4gm of Tylenol in 24 hours   HYDROXYPROPYL METHYLCELLULOSE / HYPROMELLOSE (ISOPTO TEARS / GONIOVISC) 2.5 % OPHTHALMIC SOLUTION    Place 1 drop into both eyes 3 (three) times daily as needed for dry eyes.   IPRATROPIUM-ALBUTEROL (DUONEB) 0.5-2.5 (3) MG/3ML SOLN    Inhale 3 mLs into the lungs every 6 (six) hours.    LATANOPROST (XALATAN) 0.005 % OPHTHALMIC SOLUTION    Place 1 drop into both eyes at bedtime.   LOSARTAN (COZAAR) 25 MG TABLET    Take 25 mg by mouth daily.   MENTHOL (ICY HOT) 5 % PTCH    Apply 1 patch topically daily. Apply to right hip and right knee   METFORMIN (GLUCOPHAGE) 500 MG TABLET    TAKE 1 TABLET BY MOUTH TWICE DAILY   MULTIPLE VITAMINS-MINERALS (MULTIVITAMIN WITH MINERALS) TABLET    Take 1 tablet by mouth daily.   OXYGEN    Inhale 2 L into the lungs continuous.   PANTOPRAZOLE (PROTONIX) 40 MG TABLET    TAKE 1 TABLET BY MOUTH EVERY NIGHT   POLYETHYLENE GLYCOL (MIRALAX / GLYCOLAX) PACKET    Take 17 g by mouth daily as needed for mild constipation.   POTASSIUM CHLORIDE SA (K-DUR,KLOR-CON) 20 MEQ TABLET    Take 1 tablet (20 mEq total) by mouth daily. Take while on lasix   PREDNISONE (DELTASONE) 5 MG TABLET    Take 5 mg by mouth daily with breakfast.   PRESCRIPTION MEDICATION    Inhale into the lungs continuous. CPAP  Modified Medications   No medications on file  Discontinued Medications   LEVOFLOXACIN (LEVAQUIN) 500 MG TABLET    Take 1 tablet (500 mg total) by mouth daily.   PREDNISONE (DELTASONE) 20 MG TABLET    40 mg daily for 3 days then 30 mg daily for 3 days then 20 mg  daily for 3 days then 10 mg dailly for 3 days then  resume chronic dose 5 mg daily   TETRAHYDROZ-POLYVINYL AL-POVID (CLEAR EYES TRIPLE ACTION) 0.05-0.5-0.6 % SOLN    Place 1 drop into both eyes 3 (three) times daily as needed (dry eyes).     Allergies  Allergen Reactions  . Brovana [Arformoterol Tartrate] Shortness Of Breath and Cough    General intolerance  . Budesonide Shortness Of Breath and Cough    General intolerance  . Mucomyst [Acetylcysteine] Shortness Of Breath     REVIEW OF SYSTEMS:  GENERAL: no change in appetite, no fatigue, no weight changes, no fever, chills or weakness SKIN: Denies rash, itching, wounds, ulcer sores, or nail abnormality EYES: Denies change in vision, dry eyes, eye pain, itching or discharge EARS: Denies change in hearing, ringing in ears, or earache NOSE: Denies nasal congestion or epistaxis MOUTH and THROAT: Denies oral discomfort, gingival pain or bleeding, pain from teeth or hoarseness   RESPIRATORY: no cough, SOB, DOE, wheezing, hemoptysis CARDIAC: no chest pain, edema or palpitations GI: no abdominal pain, diarrhea, constipation, heart burn, nausea or vomiting GU: Denies dysuria, frequency, hematuria, incontinence, or discharge MUSCULOSKELETAL: Denies joit pain, muscle pain, back pain, restricted movement, or unusual weakness CIRCULATION: Denies claudication, edema of legs, varicosities, or cold extremities NEUROLOGICAL: Denies dizziness, syncope, numbness, or headache PSYCHIATRIC: Denies feeling of depression or anxiety. No report of hallucinations, insomnia, paranoia, or agitation ENDOCRINE: Denies polyphagia, polyuria, polydipsia, heat or cold intolerance HEME/LYMPH: Denies excessive bruising, petechia, enlarged lymph nodes, or bleeding problems IMMUNOLOGIC: Denies history of frequent infections, AIDS, or use of immunosuppressive agents    PHYSICAL EXAMINATION  GENERAL APPEARANCE: Well nourished. In no acute distress. Normal body habitus SKIN:  Skin is warm and dry. There are no  suspicious lesions or rash HEAD: Normal in size and contour. No evidence of trauma EYES: Lids open and close normally. No blepharitis, entropion or ectropion. PERRL. Conjunctivae are clear and sclerae are white. Lenses are without opacity EARS: Pinnae are normal. Patient hears normal voice tunes of the examiner MOUTH and THROAT: Lips are without lesions. Oral mucosa is moist and without lesions. Tongue is normal in shape, size, and color and without lesions NECK: supple, trachea midline, no neck masses, no thyroid tenderness, no thyromegaly LYMPHATICS: no LAN in the neck, no supraclavicular LAN RESPIRATORY: breathing is even & unlabored, BS CTAB CARDIAC: RRR, no murmur,no extra heart sounds, no edema GI: abdomen soft, normal BS, no masses, no tenderness, no hepatomegaly, no splenomegaly MUSCULOSKELETAL: No deformities. Movement at each extremity is full and painless. Strength is 5/5 at each extremity. Back is without kyphosis or scoliosis CIRCULATION: pedal pulses are 2+. There is no edema of the legs, ankles and feet NEUROLOGICAL: There is no tremor. Speech is clear PSYCHIATRIC: Alert and oriented X 3. Affect and behavior are appropriate  LABS/RADIOLOGY: Labs reviewed: Basic Metabolic Panel:  Recent Labs  40/98/11 0343 03/16/15 1944 03/17/15 0332  11/25/15 0827 11/26/15 0412 11/29/15 0705 12/03/15  NA 139  --  141  < > 140 141 142 146  K 5.1  --  4.9  < > 4.8 4.1 4.1 4.7  CL 94*  --  96*  < > 98* 102 102  --   CO2 35*  --  34*  < > 32 28 32  --   GLUCOSE 154*  --  134*  < > 182* 120* 85  --   BUN 34*  --  33*  < > 27*  24* 18 23*  CREATININE 0.77  --  0.80  < > 1.16* 0.83 0.66 0.7  CALCIUM 8.8*  --  9.0  < > 9.3 8.4* 8.8*  --   MG 1.5* 3.8* 3.4*  --   --   --   --   --   PHOS 3.1  --   --   --   --   --   --   --   < > = values in this interval not displayed. Liver Function Tests:  Recent Labs  10/17/15 1211 11/24/15 2145 11/26/15 0412  AST 20 22 19   ALT 16 16 14     ALKPHOS 55 63 54  BILITOT 0.2* 0.8 0.6  PROT 6.4* 6.6 6.0*  ALBUMIN 3.5 3.7 2.8*   CBC:  Recent Labs  06/30/15 1450  09/10/15 0545  10/17/15 1211  11/25/15 0827 11/26/15 0412 11/29/15 0705  WBC 17.5*  < > 9.4  < > 16.9*  < > 21.7* 20.7* 17.0*  NEUTROABS 14.3*  --  7.3  --  11.6*  --   --   --   --   HGB 9.5*  < > 9.1*  < > 9.7*  < > 10.1* 9.0* 10.2*  HCT 36.1  < > 31.5*  < > 36.4  < > 34.6* 30.7* 34.5*  MCV 88.9  < > 83.3  < > 91.0  < > 83.2 81.4 80.8  PLT 285  < > 245  < > 402*  < > 336 358 417*  < > = values in this interval not displayed.  Cardiac Enzymes:  Recent Labs  03/15/15 1430  TROPONINI <0.03   CBG:  Recent Labs  11/30/15 0404 11/30/15 0751 11/30/15 1225  GLUCAP 82 94 113*     Dg Esophagus  Result Date: 11/27/2015 CLINICAL DATA:  Dysphagia. EXAM: ESOPHOGRAM/BARIUM SWALLOW TECHNIQUE: Single contrast examination was performed using  thin barium. FLUOROSCOPY TIME:  Fluoroscopy Time:  1 minutes 18 seconds Radiation Exposure Index (if provided by the fluoroscopic device): 18.2 mGy Number of Acquired Spot Images: 80 COMPARISON:  None. FINDINGS: Exam is limited by patient in motility. Patient was evaluated the supine at approximately 30 degrees. The proximal esophagus is mildly patulous. There is a region of tortuosity and narrowing proximal esophagus just above the thoracic inlet (C5-C6 level). There is waist like narrowing at this level with approximately 50% luminal narrowing. The distal esophagus is tortuous.  GE junction appears normal. No barium tablet could be administered with current patient positioning. IMPRESSION: 1. Waist like narrowing of the proximal esophagus at the level of C5-C6. There is approximately 50% narrowing of diameter. 2. Tortuosity of the distal esophagus suggests esophageal spasm. 3. The GE junction appears normal. 4. A more thorough exam could be performed when patient gains mobility or consider upper endoscopy. Electronically Signed    By: Genevive Bi M.D.   On: 11/27/2015 15:05   Dg Chest Portable 1 View  Result Date: 11/24/2015 CLINICAL DATA:  Shortness of Breath EXAM: PORTABLE CHEST 1 VIEW COMPARISON:  October 17, 2015 FINDINGS: There is airspace consolidation consistent with pneumonia in the left lower lobe region. The lungs elsewhere clear. Heart size and pulmonary vascularity are normal. No adenopathy. There is atherosclerotic calcification in the aorta. There is calcification in the left carotid artery. No bone lesions evident. IMPRESSION: Left lower lobe airspace consolidation consistent with pneumonia. Aortic atherosclerosis. There is calcification in the left carotid artery as well. Followup PA and lateral chest radiographs recommended  in 3-4 weeks following trial of antibiotic therapy to ensure resolution and exclude underlying malignancy. Electronically Signed   By: Bretta Bang III M.D.   On: 11/24/2015 21:47    ASSESSMENT/PLAN:          Franchot Erichsen, [facility] [phone]    This encounter was created in error - please disregard.

## 2015-12-19 ENCOUNTER — Other Ambulatory Visit: Payer: Self-pay | Admitting: Internal Medicine

## 2015-12-19 DIAGNOSIS — J441 Chronic obstructive pulmonary disease with (acute) exacerbation: Secondary | ICD-10-CM

## 2016-01-05 ENCOUNTER — Other Ambulatory Visit: Payer: Self-pay | Admitting: Internal Medicine

## 2016-01-07 NOTE — Telephone Encounter (Signed)
Patient is requesting new supplies for CPAP and full face mask. Need to say full face mask and all supplies and tubing filter dated 5099 for a lifetime. Fax number [phone] ATTN: [facility].

## 2016-01-08 ENCOUNTER — Telehealth: Payer: Self-pay | Admitting: Internal Medicine

## 2016-01-08 DIAGNOSIS — J9620 Acute and chronic respiratory failure, unspecified whether with hypoxia or hypercapnia: Secondary | ICD-10-CM

## 2016-01-08 NOTE — Telephone Encounter (Signed)
Order sent to [facility] for CPAP supplies  I spoke with the pt and notified her this was done  Nothing further needed

## 2016-01-15 ENCOUNTER — Other Ambulatory Visit: Payer: Self-pay | Admitting: Internal Medicine

## 2016-01-15 DIAGNOSIS — J441 Chronic obstructive pulmonary disease with (acute) exacerbation: Secondary | ICD-10-CM

## 2016-01-31 ENCOUNTER — Other Ambulatory Visit: Payer: Self-pay | Admitting: Internal Medicine

## 2016-02-02 ENCOUNTER — Other Ambulatory Visit: Payer: Self-pay | Admitting: Internal Medicine

## 2016-02-09 ENCOUNTER — Ambulatory Visit: Payer: Medicare Other | Admitting: Internal Medicine

## 2016-02-27 ENCOUNTER — Ambulatory Visit: Payer: Medicare Other | Admitting: Internal Medicine

## 2016-02-27 ENCOUNTER — Telehealth: Payer: Self-pay | Admitting: Internal Medicine

## 2016-02-27 NOTE — Telephone Encounter (Signed)
Spoke with pt's daughter, [name]. States that pt is now under Hospice care. Pt had an appointment today but due to a recent fall, [name] was afraid to bring her today. Abbott Paodvised [name] that we understood and to please let us know if anything was needed for the pt. [name] was very Adult nurseappreciative. Nothing further was needed.

## 2016-02-28 ENCOUNTER — Emergency Department (HOSPITAL_COMMUNITY)

## 2016-02-28 ENCOUNTER — Inpatient Hospital Stay (HOSPITAL_COMMUNITY)

## 2016-02-28 ENCOUNTER — Encounter (HOSPITAL_COMMUNITY): Payer: Self-pay | Admitting: Emergency Medicine

## 2016-02-28 ENCOUNTER — Inpatient Hospital Stay (HOSPITAL_COMMUNITY)
Admission: EM | Admit: 2016-02-28 | Discharge: 2016-03-04 | DRG: 871 | Disposition: A | Discharge status: Skilled Nursing Facility | Attending: Internal Medicine | Admitting: Internal Medicine

## 2016-02-28 DIAGNOSIS — E860 Dehydration: Secondary | ICD-10-CM | POA: Diagnosis present

## 2016-02-28 DIAGNOSIS — G8929 Other chronic pain: Secondary | ICD-10-CM | POA: Diagnosis present

## 2016-02-28 DIAGNOSIS — G9340 Encephalopathy, unspecified: Secondary | ICD-10-CM | POA: Diagnosis not present

## 2016-02-28 DIAGNOSIS — J441 Chronic obstructive pulmonary disease with (acute) exacerbation: Secondary | ICD-10-CM | POA: Diagnosis not present

## 2016-02-28 DIAGNOSIS — Z7951 Long term (current) use of inhaled steroids: Secondary | ICD-10-CM

## 2016-02-28 DIAGNOSIS — R64 Cachexia: Secondary | ICD-10-CM | POA: Diagnosis present

## 2016-02-28 DIAGNOSIS — R1012 Left upper quadrant pain: Secondary | ICD-10-CM | POA: Diagnosis present

## 2016-02-28 DIAGNOSIS — R5381 Other malaise: Secondary | ICD-10-CM | POA: Diagnosis not present

## 2016-02-28 DIAGNOSIS — E1151 Type 2 diabetes mellitus with diabetic peripheral angiopathy without gangrene: Secondary | ICD-10-CM | POA: Diagnosis present

## 2016-02-28 DIAGNOSIS — Z681 Body mass index (BMI) 19 or less, adult: Secondary | ICD-10-CM

## 2016-02-28 DIAGNOSIS — M549 Dorsalgia, unspecified: Secondary | ICD-10-CM | POA: Diagnosis present

## 2016-02-28 DIAGNOSIS — E43 Unspecified severe protein-calorie malnutrition: Secondary | ICD-10-CM | POA: Diagnosis present

## 2016-02-28 DIAGNOSIS — J181 Lobar pneumonia, unspecified organism: Secondary | ICD-10-CM

## 2016-02-28 DIAGNOSIS — I10 Essential (primary) hypertension: Secondary | ICD-10-CM | POA: Diagnosis not present

## 2016-02-28 DIAGNOSIS — J189 Pneumonia, unspecified organism: Secondary | ICD-10-CM | POA: Diagnosis present

## 2016-02-28 DIAGNOSIS — Z9981 Dependence on supplemental oxygen: Secondary | ICD-10-CM | POA: Diagnosis not present

## 2016-02-28 DIAGNOSIS — Z90710 Acquired absence of both cervix and uterus: Secondary | ICD-10-CM | POA: Diagnosis not present

## 2016-02-28 DIAGNOSIS — J9622 Acute and chronic respiratory failure with hypercapnia: Secondary | ICD-10-CM | POA: Diagnosis present

## 2016-02-28 DIAGNOSIS — Z79891 Long term (current) use of opiate analgesic: Secondary | ICD-10-CM

## 2016-02-28 DIAGNOSIS — I110 Hypertensive heart disease with heart failure: Secondary | ICD-10-CM | POA: Diagnosis present

## 2016-02-28 DIAGNOSIS — Z515 Encounter for palliative care: Secondary | ICD-10-CM | POA: Diagnosis not present

## 2016-02-28 DIAGNOSIS — E785 Hyperlipidemia, unspecified: Secondary | ICD-10-CM | POA: Diagnosis present

## 2016-02-28 DIAGNOSIS — M8580 Other specified disorders of bone density and structure, unspecified site: Secondary | ICD-10-CM | POA: Diagnosis present

## 2016-02-28 DIAGNOSIS — Z888 Allergy status to other drugs, medicaments and biological substances status: Secondary | ICD-10-CM

## 2016-02-28 DIAGNOSIS — E559 Vitamin D deficiency, unspecified: Secondary | ICD-10-CM | POA: Diagnosis present

## 2016-02-28 DIAGNOSIS — Z7982 Long term (current) use of aspirin: Secondary | ICD-10-CM

## 2016-02-28 DIAGNOSIS — Z8249 Family history of ischemic heart disease and other diseases of the circulatory system: Secondary | ICD-10-CM

## 2016-02-28 DIAGNOSIS — D638 Anemia in other chronic diseases classified elsewhere: Secondary | ICD-10-CM | POA: Diagnosis present

## 2016-02-28 DIAGNOSIS — Z9842 Cataract extraction status, left eye: Secondary | ICD-10-CM

## 2016-02-28 DIAGNOSIS — A419 Sepsis, unspecified organism: Secondary | ICD-10-CM | POA: Diagnosis present

## 2016-02-28 DIAGNOSIS — F40240 Claustrophobia: Secondary | ICD-10-CM | POA: Diagnosis present

## 2016-02-28 DIAGNOSIS — D649 Anemia, unspecified: Secondary | ICD-10-CM | POA: Diagnosis present

## 2016-02-28 DIAGNOSIS — I5032 Chronic diastolic (congestive) heart failure: Secondary | ICD-10-CM | POA: Diagnosis not present

## 2016-02-28 DIAGNOSIS — K224 Dyskinesia of esophagus: Secondary | ICD-10-CM | POA: Diagnosis not present

## 2016-02-28 DIAGNOSIS — E1165 Type 2 diabetes mellitus with hyperglycemia: Secondary | ICD-10-CM | POA: Diagnosis present

## 2016-02-28 DIAGNOSIS — R911 Solitary pulmonary nodule: Secondary | ICD-10-CM

## 2016-02-28 DIAGNOSIS — J9621 Acute and chronic respiratory failure with hypoxia: Secondary | ICD-10-CM | POA: Diagnosis present

## 2016-02-28 DIAGNOSIS — Z981 Arthrodesis status: Secondary | ICD-10-CM

## 2016-02-28 DIAGNOSIS — M159 Polyosteoarthritis, unspecified: Secondary | ICD-10-CM | POA: Diagnosis present

## 2016-02-28 DIAGNOSIS — J9612 Chronic respiratory failure with hypercapnia: Secondary | ICD-10-CM

## 2016-02-28 DIAGNOSIS — Z961 Presence of intraocular lens: Secondary | ICD-10-CM | POA: Diagnosis present

## 2016-02-28 DIAGNOSIS — F419 Anxiety disorder, unspecified: Secondary | ICD-10-CM | POA: Diagnosis present

## 2016-02-28 DIAGNOSIS — F17210 Nicotine dependence, cigarettes, uncomplicated: Secondary | ICD-10-CM | POA: Diagnosis present

## 2016-02-28 DIAGNOSIS — Z7189 Other specified counseling: Secondary | ICD-10-CM | POA: Diagnosis not present

## 2016-02-28 DIAGNOSIS — Z66 Do not resuscitate: Secondary | ICD-10-CM | POA: Diagnosis not present

## 2016-02-28 DIAGNOSIS — Z803 Family history of malignant neoplasm of breast: Secondary | ICD-10-CM

## 2016-02-28 DIAGNOSIS — J449 Chronic obstructive pulmonary disease, unspecified: Secondary | ICD-10-CM | POA: Diagnosis present

## 2016-02-28 DIAGNOSIS — E119 Type 2 diabetes mellitus without complications: Secondary | ICD-10-CM

## 2016-02-28 DIAGNOSIS — Z7984 Long term (current) use of oral hypoglycemic drugs: Secondary | ICD-10-CM

## 2016-02-28 DIAGNOSIS — Z7952 Long term (current) use of systemic steroids: Secondary | ICD-10-CM

## 2016-02-28 DIAGNOSIS — J690 Pneumonitis due to inhalation of food and vomit: Secondary | ICD-10-CM | POA: Diagnosis present

## 2016-02-28 DIAGNOSIS — J9601 Acute respiratory failure with hypoxia: Secondary | ICD-10-CM | POA: Diagnosis present

## 2016-02-28 DIAGNOSIS — Z9841 Cataract extraction status, right eye: Secondary | ICD-10-CM

## 2016-02-28 LAB — I-STAT VENOUS BLOOD GAS, ED
Acid-Base Excess: 8 mmol/L — ABNORMAL HIGH (ref 0.0–2.0)
BICARBONATE: 37 mmol/L — AB (ref 20.0–28.0)
O2 Saturation: 79 %
PCO2 VEN: 80.8 mmHg — AB (ref 44.0–60.0)
Patient temperature: 37
TCO2: 39 mmol/L (ref 0–100)
pH, Ven: 7.269 (ref 7.250–7.430)
pO2, Ven: 52 mmHg — ABNORMAL HIGH (ref 32.0–45.0)

## 2016-02-28 LAB — COMPREHENSIVE METABOLIC PANEL
ALBUMIN: 3.2 g/dL — AB (ref 3.5–5.0)
ALT: 17 U/L (ref 14–54)
AST: 22 U/L (ref 15–41)
Alkaline Phosphatase: 61 U/L (ref 38–126)
Anion gap: 11 (ref 5–15)
BILIRUBIN TOTAL: 0.6 mg/dL (ref 0.3–1.2)
BUN: 24 mg/dL — AB (ref 6–20)
CO2: 32 mmol/L (ref 22–32)
Calcium: 9.4 mg/dL (ref 8.9–10.3)
Chloride: 101 mmol/L (ref 101–111)
Creatinine, Ser: 0.96 mg/dL (ref 0.44–1.00)
GFR calc Af Amer: >60 mL/min (ref >60)
GFR calc non Af Amer: 58 mL/min — ABNORMAL LOW (ref >60)
GLUCOSE: 183 mg/dL — AB (ref 65–99)
POTASSIUM: 4.2 mmol/L (ref 3.5–5.1)
Sodium: 144 mmol/L (ref 135–145)
TOTAL PROTEIN: 6.3 g/dL — AB (ref 6.5–8.1)

## 2016-02-28 LAB — I-STAT TROPONIN, ED: Troponin i, poc: 0.06 ng/mL (ref 0.00–0.08)

## 2016-02-28 LAB — URINALYSIS, ROUTINE W REFLEX MICROSCOPIC
Bilirubin Urine: NEGATIVE
Glucose, UA: NEGATIVE mg/dL
Hgb urine dipstick: NEGATIVE
Ketones, ur: NEGATIVE mg/dL
Leukocytes, UA: NEGATIVE
NITRITE: NEGATIVE
PH: 5 (ref 5.0–8.0)
Protein, ur: 30 mg/dL — AB
SPECIFIC GRAVITY, URINE: 1.02 (ref 1.005–1.030)

## 2016-02-28 LAB — INFLUENZA PANEL BY PCR (TYPE A & B)
Influenza A By PCR: NEGATIVE
Influenza B By PCR: NEGATIVE

## 2016-02-28 LAB — CBC WITH DIFFERENTIAL/PLATELET
BASOS ABS: 0 10*3/uL (ref 0.0–0.1)
BASOS PCT: 0 %
Eosinophils Absolute: 0.2 10*3/uL (ref 0.0–0.7)
Eosinophils Relative: 1 %
HEMATOCRIT: 30.4 % — AB (ref 36.0–46.0)
Hemoglobin: 8.3 g/dL — ABNORMAL LOW (ref 12.0–15.0)
Lymphocytes Relative: 10 %
Lymphs Abs: 2.3 10*3/uL (ref 0.7–4.0)
MCH: 22.9 pg — ABNORMAL LOW (ref 26.0–34.0)
MCHC: 27.3 g/dL — ABNORMAL LOW (ref 30.0–36.0)
MCV: 84 fL (ref 78.0–100.0)
Monocytes Absolute: 1 10*3/uL (ref 0.1–1.0)
Monocytes Relative: 4 %
NEUTROS ABS: 21.1 10*3/uL — AB (ref 1.7–7.7)
NEUTROS PCT: 85 %
Platelets: 274 10*3/uL (ref 150–400)
RBC: 3.62 MIL/uL — AB (ref 3.87–5.11)
RDW: 17.4 % — AB (ref 11.5–15.5)
WBC: 24.6 10*3/uL — AB (ref 4.0–10.5)

## 2016-02-28 LAB — I-STAT ARTERIAL BLOOD GAS, ED
Acid-Base Excess: 7 mmol/L — ABNORMAL HIGH (ref 0.0–2.0)
Bicarbonate: 36.6 mmol/L — ABNORMAL HIGH (ref 20.0–28.0)
O2 Saturation: 86 %
PCO2 ART: 85.5 mmHg — AB (ref 32.0–48.0)
PH ART: 7.24 — AB (ref 7.350–7.450)
Patient temperature: 98.6
TCO2: 39 mmol/L (ref 0–100)
pO2, Arterial: 63 mmHg — ABNORMAL LOW (ref 83.0–108.0)

## 2016-02-28 LAB — HIV ANTIBODY (ROUTINE TESTING W REFLEX): HIV Screen 4th Generation wRfx: NONREACTIVE

## 2016-02-28 LAB — LIPASE, BLOOD: LIPASE: 16 U/L (ref 11–51)

## 2016-02-28 LAB — PROTIME-INR
INR: 0.97
PROTHROMBIN TIME: 12.9 s (ref 11.4–15.2)

## 2016-02-28 LAB — CBG MONITORING, ED: Glucose-Capillary: 164 mg/dL — ABNORMAL HIGH (ref 65–99)

## 2016-02-28 LAB — BRAIN NATRIURETIC PEPTIDE: B Natriuretic Peptide: 155 pg/mL — ABNORMAL HIGH (ref 0.0–100.0)

## 2016-02-28 LAB — I-STAT CHEM 8, ED
BUN: 29 mg/dL — ABNORMAL HIGH (ref 6–20)
CALCIUM ION: 1.22 mmol/L (ref 1.15–1.40)
CHLORIDE: 98 mmol/L — AB (ref 101–111)
Creatinine, Ser: 1 mg/dL (ref 0.44–1.00)
Glucose, Bld: 176 mg/dL — ABNORMAL HIGH (ref 65–99)
HEMATOCRIT: 30 % — AB (ref 36.0–46.0)
Hemoglobin: 10.2 g/dL — ABNORMAL LOW (ref 12.0–15.0)
Potassium: 4 mmol/L (ref 3.5–5.1)
SODIUM: 142 mmol/L (ref 135–145)
TCO2: 33 mmol/L (ref 0–100)

## 2016-02-28 LAB — MRSA PCR SCREENING: MRSA BY PCR: NEGATIVE

## 2016-02-28 LAB — LACTIC ACID, PLASMA
LACTIC ACID, VENOUS: 1.7 mmol/L (ref 0.5–1.9)
Lactic Acid, Venous: 1 mmol/L (ref 0.5–1.9)

## 2016-02-28 LAB — GLUCOSE, CAPILLARY
GLUCOSE-CAPILLARY: 145 mg/dL — AB (ref 65–99)
Glucose-Capillary: 245 mg/dL — ABNORMAL HIGH (ref 65–99)

## 2016-02-28 LAB — APTT: aPTT: 27 seconds (ref 24–36)

## 2016-02-28 LAB — PROCALCITONIN: PROCALCITONIN: 0.19 ng/mL

## 2016-02-28 LAB — STREP PNEUMONIAE URINARY ANTIGEN: STREP PNEUMO URINARY ANTIGEN: NEGATIVE

## 2016-02-28 MED ORDER — IPRATROPIUM-ALBUTEROL 0.5-2.5 (3) MG/3ML IN SOLN
3.0000 mL | RESPIRATORY_TRACT | Status: DC
  Administered 2016-02-28 (×3): 3 mL via RESPIRATORY_TRACT
  Filled 2016-02-28 (×3): qty 3

## 2016-02-28 MED ORDER — ALPRAZOLAM 0.5 MG PO TABS
1.0000 mg | ORAL_TABLET | Freq: Once | ORAL | Status: AC
  Administered 2016-02-28: 1 mg via ORAL
  Filled 2016-02-28: qty 2

## 2016-02-28 MED ORDER — IPRATROPIUM-ALBUTEROL 0.5-2.5 (3) MG/3ML IN SOLN
3.0000 mL | RESPIRATORY_TRACT | Status: AC
  Administered 2016-02-28 (×3): 3 mL via RESPIRATORY_TRACT
  Filled 2016-02-28: qty 3
  Filled 2016-02-28: qty 6

## 2016-02-28 MED ORDER — VANCOMYCIN HCL IN DEXTROSE 750-5 MG/150ML-% IV SOLN
750.0000 mg | INTRAVENOUS | Status: DC
  Administered 2016-02-29: 750 mg via INTRAVENOUS
  Filled 2016-02-28: qty 150

## 2016-02-28 MED ORDER — ENOXAPARIN SODIUM 40 MG/0.4ML ~~LOC~~ SOLN: 40.0000 mg | SUBCUTANEOUS | Status: DC

## 2016-02-28 MED ORDER — ENOXAPARIN SODIUM 40 MG/0.4ML ~~LOC~~ SOLN
40.0000 mg | SUBCUTANEOUS | Status: DC
  Administered 2016-02-28 – 2016-03-03 (×5): 40 mg via SUBCUTANEOUS
  Filled 2016-02-28 (×6): qty 0.4

## 2016-02-28 MED ORDER — DEXTROSE 5 % IV SOLN
500.0000 mg | Freq: Once | INTRAVENOUS | Status: AC
  Administered 2016-02-28: 500 mg via INTRAVENOUS
  Filled 2016-02-28: qty 500

## 2016-02-28 MED ORDER — ONDANSETRON HCL 4 MG/2ML IJ SOLN: 4.0000 mg | Freq: Four times a day (QID) | INTRAMUSCULAR | Status: DC | PRN

## 2016-02-28 MED ORDER — SODIUM CHLORIDE 0.9 % IV SOLN
INTRAVENOUS | Status: DC
  Administered 2016-02-28: 08:00:00 via INTRAVENOUS

## 2016-02-28 MED ORDER — FLUTICASONE PROPIONATE 50 MCG/ACT NA SUSP
1.0000 | Freq: Two times a day (BID) | NASAL | Status: DC
  Administered 2016-02-29 – 2016-03-04 (×9): 1 via NASAL
  Filled 2016-02-28: qty 16

## 2016-02-28 MED ORDER — METHYLPREDNISOLONE SODIUM SUCC 40 MG IJ SOLR
40.0000 mg | Freq: Every day | INTRAMUSCULAR | Status: DC
  Administered 2016-02-28 – 2016-02-29 (×2): 40 mg via INTRAVENOUS
  Filled 2016-02-28 (×2): qty 1

## 2016-02-28 MED ORDER — LORAZEPAM 2 MG/ML IJ SOLN: 0.5000 mg | Freq: Two times a day (BID) | INTRAMUSCULAR | Status: DC

## 2016-02-28 MED ORDER — SODIUM CHLORIDE 0.9% FLUSH
3.0000 mL | Freq: Two times a day (BID) | INTRAVENOUS | Status: DC
  Administered 2016-02-28 – 2016-03-01 (×5): 3 mL via INTRAVENOUS

## 2016-02-28 MED ORDER — ACETAMINOPHEN 650 MG RE SUPP: 650.0000 mg | Freq: Four times a day (QID) | RECTAL | Status: DC | PRN

## 2016-02-28 MED ORDER — ONDANSETRON HCL 4 MG PO TABS: 4.0000 mg | ORAL_TABLET | Freq: Four times a day (QID) | ORAL | Status: DC | PRN

## 2016-02-28 MED ORDER — BUDESONIDE 0.25 MG/2ML IN SUSP
0.2500 mg | Freq: Two times a day (BID) | RESPIRATORY_TRACT | Status: DC
  Administered 2016-02-28 – 2016-03-04 (×10): 0.25 mg via RESPIRATORY_TRACT
  Filled 2016-02-28 (×11): qty 2

## 2016-02-28 MED ORDER — VANCOMYCIN HCL IN DEXTROSE 1-5 GM/200ML-% IV SOLN
1000.0000 mg | Freq: Once | INTRAVENOUS | Status: AC
  Administered 2016-02-28: 1000 mg via INTRAVENOUS
  Filled 2016-02-28: qty 200

## 2016-02-28 MED ORDER — INSULIN ASPART 100 UNIT/ML ~~LOC~~ SOLN
0.0000 [IU] | SUBCUTANEOUS | Status: DC
Start: 2016-02-28 — End: 2016-02-29
  Administered 2016-02-28: 1 [IU] via SUBCUTANEOUS
  Administered 2016-02-28: 2 [IU] via SUBCUTANEOUS
  Administered 2016-02-28: 3 [IU] via SUBCUTANEOUS
  Administered 2016-02-29: 1 [IU] via SUBCUTANEOUS
  Filled 2016-02-28: qty 1

## 2016-02-28 MED ORDER — CEFEPIME HCL 1 G IJ SOLR
1.0000 g | INTRAMUSCULAR | Status: DC
  Administered 2016-02-28 – 2016-03-01 (×3): 1 g via INTRAVENOUS
  Filled 2016-02-28 (×3): qty 1

## 2016-02-28 MED ORDER — HYPROMELLOSE (GONIOSCOPIC) 2.5 % OP SOLN
1.0000 [drp] | Freq: Three times a day (TID) | OPHTHALMIC | Status: DC | PRN
  Filled 2016-02-28: qty 15

## 2016-02-28 MED ORDER — DEXTROSE 5 % IV SOLN
1.0000 g | Freq: Once | INTRAVENOUS | Status: AC
  Administered 2016-02-28: 1 g via INTRAVENOUS
  Filled 2016-02-28: qty 10

## 2016-02-28 MED ORDER — IPRATROPIUM-ALBUTEROL 0.5-2.5 (3) MG/3ML IN SOLN
3.0000 mL | Freq: Four times a day (QID) | RESPIRATORY_TRACT | Status: DC
  Administered 2016-02-29 – 2016-03-04 (×19): 3 mL via RESPIRATORY_TRACT
  Filled 2016-02-28 (×20): qty 3

## 2016-02-28 MED ORDER — ACETAMINOPHEN 325 MG PO TABS: 650.0000 mg | ORAL_TABLET | Freq: Four times a day (QID) | ORAL | Status: DC | PRN

## 2016-02-28 MED ORDER — LATANOPROST 0.005 % OP SOLN
1.0000 [drp] | Freq: Every day | OPHTHALMIC | Status: DC
  Administered 2016-02-29 – 2016-03-03 (×4): 1 [drp] via OPHTHALMIC
  Filled 2016-02-28 (×3): qty 2.5

## 2016-02-28 NOTE — Progress Notes (Signed)
Xcover, family states can't take ativan, requesting xanax be restarted, please address in am, thanks

## 2016-02-28 NOTE — ED Notes (Signed)
Pt requests bedpan. Pt was unable to urinate.

## 2016-02-28 NOTE — ED Notes (Signed)
Pt on bedpan. Urine collected.

## 2016-02-28 NOTE — ED Provider Notes (Signed)
[facility]-EMERGENCY DEPT Provider Note   CSN: [id number] Arrival date & time: 02/28/16  [id number]     History   Chief Complaint Chief Complaint  Patient presents with  . Shortness of Breath    HPI Nicole Maxwell is a 71 y.o. female.  Pt presents to the ED today with increased sob.  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  Pt's oxycodone was recently increased which, in the past, has caused an increase in her pCO2.  Pt is unable to give any hx as she is very sob and somnolent.      Past Medical History:  Diagnosis Date  . Alcohol abuse, in remission quit 2004  . Anemia of chronic disease   . Anxiety    Xanax prn  . Arthritis    "my whole body"  . Asthma   . Chronic back pain    "goes from the lower part of my back on down my legs"  (04/17/2014)  . Chronic respiratory failure with hypercapnia (HCC)   . COPD (chronic obstructive pulmonary disease) (HCC)    cxr 04/03/2010 - hyperinflation  . COPD exacerbation (HCC)   . Dysphagia   . Esophageal dysmotility   . HCAP (healthcare-associated pneumonia)   . Hyperlipidemia   . Hypertension   . Leukocytosis   . Neuromuscular disorder (HCC)    lumbar disc  . On home oxygen therapy    "2L; suppose to be 24/7" (04/17/2014)  . Osteopenia   . Physical deconditioning   . Pneumonia    "3 times in the last month or so" (04/17/2014)  . Protein calorie malnutrition (HCC)   . PVD (peripheral vascular disease) (HCC)   . Shortness of breath    with anxiety  and activty  . Tobacco abuse    " I am addicted"   . Type II diabetes mellitus (HCC)   . Vitamin D deficiency disease    03/26/2010 - 9.9ng/mL. Started on replacemnt    Patient Active Problem List   Diagnosis Date Noted  . Sepsis (HCC) 11/25/2015  . Respiratory failure (HCC) 11/25/2015  . Pneumonia 11/24/2015  . Acute respiratory failure with hypercapnia (HCC) 10/17/2015  . Anemia 06/30/2015  . CAP (community acquired pneumonia) 06/30/2015  . Acute  encephalopathy 06/30/2015  . Tobacco use 06/30/2015  . Abnormal echocardiogram 04/07/2015  . Protein-calorie malnutrition, severe 03/23/2015  . Acute respiratory failure with hypoxia and hypercarbia (HCC) 03/22/2015  . Slow transit constipation 01/07/2015  . Normocytic anemia 11/27/2014  . Essential hypertension 11/27/2014  . Leukocytosis 11/26/2014  . Physical deconditioning 07/01/2014  . Smoking 07/01/2014  . Pulmonary infiltrate in left lung on chest x-ray 04/16/2014  . DM type 2 (diabetes mellitus, type 2) (HCC) 04/16/2014  . Chronic respiratory failure (HCC) 04/16/2014  . Vitamin D deficiency disease 04/16/2014  . Pulmonary infiltrate 04/16/2014  . Esophageal dysmotility 02/27/2014  . HCAP (healthcare-associated pneumonia) 02/21/2014  . Healthcare-associated pneumonia 02/21/2014  . Abdominal pain, epigastric 01/09/2014  . Chronic diastolic congestive heart failure (HCC) 01/07/2014  . Malnutrition of moderate degree (HCC) 01/03/2014  . Acute on chronic respiratory failure (HCC) 01/03/2014  . COPD with acute exacerbation (HCC) 01/01/2014  . Hyponatremia 04/09/2012    Class: Acute  . Diabetes mellitus type 2, noninsulin dependent (HCC) 04/06/2012  . COPD exacerbation (HCC) 12/09/2011  . Preop pulmonary/respiratory exam 02/28/2011  . HTN (hypertension) 05/12/2010  . Hyperlipidemia 05/12/2010  . Severe chronic obstructive pulmonary disease (HCC) 04/29/2010  .  Chest pain 04/29/2010    Past Surgical History:  Procedure Laterality Date  . ABDOMINAL HYSTERECTOMY     "partial"  . APPENDECTOMY  1960  . BACK SURGERY    . CARPAL TUNNEL RELEASE Bilateral    CTS repair 2000 (R), 2006 (L)/notes 01/01/2014  . CATARACT EXTRACTION W/ INTRAOCULAR LENS  IMPLANT, BILATERAL Bilateral 2015  . LUMBAR LAMINECTOMY/DECOMPRESSION MICRODISCECTOMY  2005   L4-5/notes 03/06/2010  . POSTERIOR LUMBAR FUSION  2002  . TONSILLECTOMY  1960    OB History    No data available       Home  Medications    Prior to Admission medications   Medication Sig Start Date End Date Taking? Authorizing Provider  acetaminophen (TYLENOL) 650 MG CR tablet Take 650 mg by mouth every 8 (eight) hours as needed for pain.     Historical Provider, MD  albuterol (PROAIR HFA) 108 (90 Base) MCG/ACT inhaler Inhale 2 puffs into the lungs every 6 (six) hours as needed.     Historical Provider, MD  albuterol (PROVENTIL) (2.5 MG/3ML) 0.083% nebulizer solution Take 3 mLs (2.5 mg total) by nebulization every 4 (four) hours as needed for wheezing or shortness of breath. (may take 1 every 4 hours as needed for wheezing/shortness of breath) DX: 496 01/09/14   [name], MD  alprazolam Prudy Feeler) 2 MG tablet Take 1/2 tablet by mouth once daily; Take 1 tablet by mouth every night at bedtime 12/01/15   Tiffany L Reed, DO  amLODipine (NORVASC) 10 MG tablet Take 1 tablet (10 mg total) by mouth daily. 10/25/15   Jeralyn Bennett, MD  aspirin EC 81 MG EC tablet Take 1 tablet (81 mg total) by mouth daily. 01/09/14   [name], MD  atorvastatin (LIPITOR) 20 MG tablet Take 20 mg by mouth daily.  09/14/14   Historical Provider, MD  DULoxetine (CYMBALTA) 60 MG capsule Take 60 mg by mouth daily. Reported on 08/01/2015    Historical Provider, MD  feeding supplement, GLUCERNA SHAKE, (GLUCERNA SHAKE) LIQD Take 237 mLs by mouth daily.    Historical Provider, MD  fluticasone (FLONASE) 50 MCG/ACT nasal spray Place 1 spray into both nostrils 2 (two) times daily. 12/23/14   Sharee Holster, NP  furosemide (LASIX) 20 MG tablet Take 20 mg by mouth daily.     Historical Provider, MD  HYDROcodone-acetaminophen (NORCO/VICODIN) 5-325 MG tablet Take 1 tablet by mouth every 6 (six) hours as needed for moderate pain. Do not exceed 4gm of Tylenol in 24 hours 12/01/15   Tiffany L Reed, DO  hydroxypropyl methylcellulose / hypromellose (ISOPTO TEARS / GONIOVISC) 2.5 % ophthalmic solution Place 1 drop into both eyes 3 (three) times daily as needed for dry  eyes.    Historical Provider, MD  ipratropium-albuterol (DUONEB) 0.5-2.5 (3) MG/3ML SOLN INHALE 1 VIAL VIA NEBULIZER FOUR TIMES A DAY 01/08/16   Kalman Shan, MD  latanoprost (XALATAN) 0.005 % ophthalmic solution Place 1 drop into both eyes at bedtime. 10/15/15   Historical Provider, MD  losartan (COZAAR) 25 MG tablet Take 25 mg by mouth daily. 10/01/15   Historical Provider, MD  Menthol (ICY HOT) 5 % PTCH Apply 1 patch topically daily. Apply to right hip and right knee    Historical Provider, MD  metFORMIN (GLUCOPHAGE) 500 MG tablet TAKE 1 TABLET BY MOUTH TWICE DAILY 01/07/15   Sharee Holster, NP  Multiple Vitamins-Minerals (MULTIVITAMIN WITH MINERALS) tablet Take 1 tablet by mouth daily.    Historical Provider, MD  OXYGEN Inhale 2  L into the lungs continuous.    Historical Provider, MD  pantoprazole (PROTONIX) 40 MG tablet TAKE 1 TABLET BY MOUTH EVERY NIGHT 01/07/15   Sharee Holster, NP  polyethylene glycol (MIRALAX / GLYCOLAX) packet Take 17 g by mouth daily as needed for mild constipation. 10/24/15   Jeralyn Bennett, MD  potassium chloride SA (K-DUR,KLOR-CON) 20 MEQ tablet Take 1 tablet (20 mEq total) by mouth daily. Take while on lasix 12/12/14   Jeanella Craze, NP  predniSONE (DELTASONE) 5 MG tablet Take 5 mg by mouth daily with breakfast.    Historical Provider, MD  PRESCRIPTION MEDICATION Inhale into the lungs continuous. CPAP    Historical Provider, MD    Family History Family History  Problem Relation Age of Onset  . Heart attack Sister 27    CABG  . Breast cancer Sister   . Cancer Brother   . Heart attack Brother   . Hypertension Other     all siblings  . Anesthesia problems Neg Hx     Social History Social History  Substance Use Topics  . Smoking status: Current Some Day Smoker    Packs/day: 0.25    Years: 56.00    Types: Cigarettes  . Smokeless tobacco: Never Used     Comment: 5 cigs per week/10.9.17  . Alcohol use 0.0 oz/week     Comment: h/o heavy ETOH quit 2004      Allergies   Brovana [arformoterol tartrate]; Budesonide; and Mucomyst [acetylcysteine]   Review of Systems Review of Systems  Unable to perform ROS: Mental status change     Physical Exam Updated Vital Signs BP (!) 142/53   Pulse 102   Temp 97.8 F (36.6 C) (Oral)   Resp (!) 29   SpO2 100%   Physical Exam  Constitutional: She appears well-developed. She appears lethargic. She appears distressed.  HENT:  Head: Normocephalic and atraumatic.  Right Ear: External ear normal.  Left Ear: External ear normal.  Nose: Nose normal.  Eyes: Conjunctivae and EOM are normal. Pupils are equal, round, and reactive to light.  Neck: Normal range of motion. Neck supple.  Cardiovascular: Regular rhythm, normal heart sounds and intact distal pulses.  Tachycardia present.   Pulmonary/Chest: Accessory muscle usage present. Tachypnea noted. She is in respiratory distress.  Abdominal: Soft. Bowel sounds are normal.  Musculoskeletal: Normal range of motion.  Neurological: She appears lethargic.  Pt will not talk, so I am unable to assess orientation.  Skin: Skin is warm.  Nursing note and vitals reviewed.    ED Treatments / Results  Labs (all labs ordered are listed, but only abnormal results are displayed) Labs Reviewed  CBC WITH DIFFERENTIAL/PLATELET - Abnormal; Notable for the following:       Result Value   WBC 24.6 (*)    RBC 3.62 (*)    Hemoglobin 8.3 (*)    HCT 30.4 (*)    MCH 22.9 (*)    MCHC 27.3 (*)    RDW 17.4 (*)    Neutro Abs 21.1 (*)    All other components within normal limits  COMPREHENSIVE METABOLIC PANEL - Abnormal; Notable for the following:    Glucose, Bld 183 (*)    BUN 24 (*)    Total Protein 6.3 (*)    Albumin 3.2 (*)    GFR calc non Af Amer 58 (*)    All other components within normal limits  BRAIN NATRIURETIC PEPTIDE - Abnormal; Notable for the following:    B  Natriuretic Peptide 155.0 (*)    All other components within normal limits  I-STAT CHEM  8, ED - Abnormal; Notable for the following:    Chloride 98 (*)    BUN 29 (*)    Glucose, Bld 176 (*)    Hemoglobin 10.2 (*)    HCT 30.0 (*)    All other components within normal limits  I-STAT VENOUS BLOOD GAS, ED - Abnormal; Notable for the following:    pCO2, Ven 80.8 (*)    pO2, Ven 52.0 (*)    Bicarbonate 37.0 (*)    Acid-Base Excess 8.0 (*)    All other components within normal limits  BLOOD GAS, VENOUS  I-STAT TROPOININ, ED    EKG  EKG Interpretation  Date/Time:  Saturday February 28 2016 06:33:22 EST Ventricular Rate:  103 PR Interval:    QRS Duration: 72 QT Interval:  343 QTC Calculation: 449 R Axis:   51 Text Interpretation:  Sinus tachycardia Anterior infarct, old Baseline wander Otherwise no significant change Confirmed by Adela Lank MD, DANIEL [phone]) on 02/28/2016 6:48:53 AM       Radiology Dg Chest Port 1 View  Result Date: 02/28/2016 CLINICAL DATA:  Shortness of breath today. EXAM: PORTABLE CHEST 1 VIEW COMPARISON:  Radiographs 11/24/2015.  CT 03/24/2015 FINDINGS: Left lung base opacity, present on prior exams but increased currently. Possible small left pleural effusion. Mucous plugging/ airway impaction seen on prior CT. Mild vascular congestion. There is bronchial thickening. Normal heart size and mediastinal contours. Atherosclerosis of the aortic arch. No pneumothorax. No acute osseous abnormalities. IMPRESSION: Left lung base opacity, present on prior exams but increased currently. Mucous plugging/ airway impaction seen on prior CT. Findings suggest recurrence/progression of bronchial filling. This may be infectious or inflammatory. Aspiration is not excluded but felt less likely. Vascular congestion.  Mild bronchial thickening. Thoracic aortic atherosclerosis. Electronically Signed   By: Rubye Oaks M.D.   On: 02/28/2016 06:58    Procedures Procedures (including critical care time)  Medications Ordered in ED Medications  cefTRIAXone (ROCEPHIN) 1 g in  dextrose 5 % 50 mL IVPB (1 g Intravenous New Bag/Given 02/28/16 0724)  azithromycin (ZITHROMAX) 500 mg in dextrose 5 % 250 mL IVPB (not administered)  ipratropium-albuterol (DUONEB) 0.5-2.5 (3) MG/3ML nebulizer solution 3 mL (3 mLs Nebulization Given 02/28/16 6045)     Initial Impression / Assessment and Plan / ED Course  I have reviewed the triage vital signs and the nursing notes.  Pertinent labs & imaging results that were available during my care of the patient were reviewed by me and considered in my medical decision making (see chart for details).  CRITICAL CARE Performed by: Jacalyn Lefevre   Total critical care time: 30 minutes  Critical care time was exclusive of separately billable procedures and treating other patients.  Critical care was necessary to treat or prevent imminent or life-threatening deterioration.  Critical care was time spent personally by me on the following activities: development of treatment plan with patient and/or surrogate as well as nursing, discussions with consultants, evaluation of patient's response to treatment, examination of patient, obtaining history from patient or surrogate, ordering and performing treatments and interventions, ordering and review of laboratory studies, ordering and review of radiographic studies, pulse oximetry and re-evaluation of patient's condition.   Pt placed on Bipap and has improved somewhat, but still remains sob.   LLL opacity on CXR has been there, but it looks worse now.  I will treat her with abx for CAP.  Pt's daughter did finally get to the ED and said that pt did not wish to be intubated.  Pt d/w triad hospitalists for admission.   Final Clinical Impressions(s) / ED Diagnoses   Final diagnoses:  COPD exacerbation (HCC)  Chronic respiratory failure with hypercapnia (HCC)  Community acquired pneumonia of left lower lobe of lung [facility])    New Prescriptions New Prescriptions   No medications on file      Jacalyn Lefevre, MD 03/01/16 [phone]

## 2016-02-28 NOTE — H&P (Signed)
History and Physical    HINA GUPTA WUJ:[id number] DOB: 1945/07/23 DOA: 02/28/2016   PCP: [name], MD   Patient coming from/Resides with: Private residence [place] with daughter Mosetta Pigeon caregivers   Admission status: Inpatient/SDU-medically necessary to stay a minimum 2 midnights to rule out impending and/or unexpected changes in physiologic status that may differ from initial evaluation performed in the ER and/or at time of admission. Patient presents with acute respiratory failure and altered mentation in setting of known severe COPD on oxygen and nocturnal BiPAP, recent outpatient treatment (Omnicef) for presumed CAP, also recent increase in patient's chronic narcotics secondary to reports of generalized diffuse pain. Currently requiring BiPAP, IV fluids, broad-spectrum IV antibiotics, frequent neurological checks every 2 hours. CODE STATUS clarified as DO NOT INTUBATE but otherwise full. She also had recent fall several days ago and was found to have left upper quadrant tenderness on exam and slight progression in anemia with workup in process.   Chief Complaint: Shortness of breath   HPI: Nicole Maxwell is a 71 y.o. 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 Patient was last hospitalized in November 2017 for pneumonia and was dc'd to an SNF for short-term for rehabilitative therapies. Patient was seen by her PCP on 1/23 because of URI symptoms (cough and fever) and was prescribed Omnicef 300 mg twice a day without any improvement in her symptoms. Of note, the patient was scheduled to see her pulmonologist on 2/2 but because of recent falls and generalized weakness she was unable to attend that  appointment.  EMS was called to the home early this morning because the patient went to the bathroom and "couldn't get up". The triage RN documented that she repeatedly stated to the caregiver in the home "help me, help me." Daughter reports that in the past week patient's Vicodin dosage has been increased and in the past this has caused issues with hypercarbia. She was previously instructed by the pulmonary medicine team to avoid sedating medications when possible. In regards to utilization of nocturnal BiPAP the daughter reports typically the patient will wear until 3 AM and then removes because of claustrophobia symptoms. The daughter also reports that she was informed by the caregiver about 3 days ago the patient had what was described as "a really bad fall" with the patient complaining of discomfort on the right side. The daughter also reports that although the patient had an unsteady gait last night the caregiver informed her that the patient went outside to smoke at least twice.   ED Course:  Vital Signs: BP (!) 110/54   Pulse 100   Temp 97.8 F (36.6 C) (Oral)   Resp 25   SpO2 98%  PCXR: Persistent left lung basilar opacity increased. Previous CT demonstrated mucous plugging/airway impaction with current findings suggestive of recurrence or progression of bronchial filling that may be infectious or inflammatory in nature. Aspiration not excluded but less likely  Lab data: Sodium 144, potassium 4.2, chloride 11, CO2 32, glucose 183, BUN 24, creatinine 0.96, albumin 3.2, LFTs otherwise normal, BNP 155, poc troponin 0.06, white count 24,600 with neutrophils 85% and absolute neutrophils 21.1%, hemoglobin 8.3, platelets 274,000 Venous blood gas: PH 7.267, PCO2 80.8, PO2 52, ABE 8, bicarbonate 37 Medications and treatments: DuoNeb 3, Rocephin 1 g IV  1, Zithromax 500 mg IV 1  Review of Systems:  In addition to the HPI above,  No Fever-chills, myalgias or other constitutional symptoms No  Headache, changes with Vision or hearing, new weakness, tingling, numbness in any extremity, dizziness, dysarthria or word finding difficulty, gait disturbance or imbalance, tremors or seizure activity No problems swallowing food or Liquids, indigestion/reflux, choking or coughing while eating, abdominal pain with or after eating No Chest pain, Cough or Shortness of Breath, palpitations, orthopnea or DOE No Abdominal pain, N/V, melena,hematochezia, dark tarry stools, constipation No dysuria, malodorous urine, hematuria or flank pain No new skin rashes, lesions, masses or bruises, No new joint pains, aches, swelling or redness No recent unintentional weight gain or loss No polyuria, polydypsia or polyphagia   Past Medical History:  Diagnosis Date  . Alcohol abuse, in remission quit 2004  . Anemia of chronic disease   . Anxiety    Xanax prn  . Arthritis    "my whole body"  . Asthma   . Chronic back pain    "goes from the lower part of my back on down my legs"  (04/17/2014)  . Chronic respiratory failure with hypercapnia (HCC)   . COPD (chronic obstructive pulmonary disease) (HCC)    cxr 04/03/2010 - hyperinflation  . COPD exacerbation (HCC)   . Dysphagia   . Esophageal dysmotility   . HCAP (healthcare-associated pneumonia)   . Hyperlipidemia   . Hypertension   . Leukocytosis   . Neuromuscular disorder (HCC)    lumbar disc  . On home oxygen therapy    "2L; suppose to be 24/7" (04/17/2014)  . Osteopenia   . Physical deconditioning   . Pneumonia    "3 times in the last month or so" (04/17/2014)  . Protein calorie malnutrition (HCC)   . PVD (peripheral vascular disease) (HCC)   . Shortness of breath    with anxiety  and activty  . Tobacco abuse    " I am addicted"   . Type II diabetes mellitus (HCC)   . Vitamin D deficiency disease    03/26/2010 - 9.9ng/mL. Started on replacemnt    Past Surgical History:  Procedure Laterality Date  . ABDOMINAL HYSTERECTOMY     "partial"  .  APPENDECTOMY  1960  . BACK SURGERY    . CARPAL TUNNEL RELEASE Bilateral    CTS repair 2000 (R), 2006 (L)/notes 01/01/2014  . CATARACT EXTRACTION W/ INTRAOCULAR LENS  IMPLANT, BILATERAL Bilateral 2015  . LUMBAR LAMINECTOMY/DECOMPRESSION MICRODISCECTOMY  2005   L4-5/notes 03/06/2010  . POSTERIOR LUMBAR FUSION  2002  . TONSILLECTOMY  1960    Social History   Social History  . Marital status: Widowed    Spouse name: N/A  . Number of children: 1  . Years of education: N/A   Occupational History  . retired from [employer] Retired   Social History Main Topics  . Smoking status: Current Some Day Smoker    Packs/day: 0.25    Years: 56.00    Types: Cigarettes  . Smokeless tobacco: Never Used     Comment: 5 cigs per week/10.9.17  . Alcohol use 0.0 oz/week     Comment: h/o heavy ETOH quit 2004  . Drug use: No  . Sexual activity: No   Other Topics Concern  . Not on file   Social History Narrative  . No narrative on file    Mobility: Rolling walker  Work history: not obtained    Allergies  Allergen Reactions  .  Brovana [Arformoterol Tartrate] Shortness Of Breath and Cough    General intolerance  . Budesonide Shortness Of Breath and Cough    General intolerance  . Mucomyst [Acetylcysteine] Shortness Of Breath    Family History  Problem Relation Age of Onset  . Heart attack Sister 6    CABG  . Breast cancer Sister   . Cancer Brother   . Heart attack Brother   . Hypertension Other     all siblings  . Anesthesia problems Neg Hx      Prior to Admission medications   Medication Sig Start Date End Date Taking? Authorizing Provider  acetaminophen (TYLENOL) 500 MG tablet Take 500 mg by mouth every 6 (six) hours as needed for mild pain.   Yes Historical Provider, MD  albuterol (PROAIR HFA) 108 (90 Base) MCG/ACT inhaler Inhale 2 puffs into the lungs every 6 (six) hours as needed.    Yes Historical Provider, MD  albuterol (PROVENTIL) (2.5 MG/3ML) 0.083% nebulizer  solution Take 3 mLs (2.5 mg total) by nebulization every 4 (four) hours as needed for wheezing or shortness of breath. (may take 1 every 4 hours as needed for wheezing/shortness of breath) DX: 496 01/09/14  Yes [name], MD  alprazolam Prudy Feeler) 2 MG tablet Take 1/2 tablet by mouth once daily; Take 1 tablet by mouth every night at bedtime Patient taking differently: Take 1-2 mg by mouth See admin instructions. 1mg  in am, 2mg  in pm 12/01/15  Yes Tiffany L Reed, DO  aspirin EC 81 MG EC tablet Take 1 tablet (81 mg total) by mouth daily. 01/09/14  Yes [name], MD  atorvastatin (LIPITOR) 20 MG tablet Take 20 mg by mouth daily.  09/14/14  Yes Historical Provider, MD  budesonide-formoterol (SYMBICORT) 160-4.5 MCG/ACT inhaler Inhale 2 puffs into the lungs 2 (two) times daily.   Yes Historical Provider, MD  cefdinir (OMNICEF) 300 MG capsule Take 300 mg by mouth 2 (two) times daily.   Yes Historical Provider, MD  Dextromethorphan-Guaifenesin (MUCINEX DM MAXIMUM STRENGTH) 60-1200 MG TB12 Take 1 tablet by mouth every 12 (twelve) hours.   Yes Historical Provider, MD  DULoxetine (CYMBALTA) 60 MG capsule Take 60 mg by mouth daily. Reported on 08/01/2015   Yes Historical Provider, MD  feeding supplement, GLUCERNA SHAKE, (GLUCERNA SHAKE) LIQD Take 237 mLs by mouth daily.   Yes Historical Provider, MD  fluticasone (FLONASE) 50 MCG/ACT nasal spray Place 1 spray into both nostrils 2 (two) times daily. 12/23/14  Yes Sharee Holster, NP  furosemide (LASIX) 20 MG tablet Take 20 mg by mouth daily.    Yes Historical Provider, MD  HYDROcodone-acetaminophen (NORCO/VICODIN) 5-325 MG tablet Take 1 tablet by mouth every 6 (six) hours as needed for moderate pain. Do not exceed 4gm of Tylenol in 24 hours Patient taking differently: Take 2 tablets by mouth every 6 (six) hours as needed for moderate pain. Do not exceed 4gm of Tylenol in 24 hours 12/01/15  Yes Tiffany L Reed, DO  hydroxypropyl methylcellulose / hypromellose (ISOPTO  TEARS / GONIOVISC) 2.5 % ophthalmic solution Place 1 drop into both eyes 3 (three) times daily as needed for dry eyes.   Yes Historical Provider, MD  ipratropium-albuterol (DUONEB) 0.5-2.5 (3) MG/3ML SOLN INHALE 1 VIAL VIA NEBULIZER FOUR TIMES A DAY 01/08/16  Yes Kalman Shan, MD  latanoprost (XALATAN) 0.005 % ophthalmic solution Place 1 drop into both eyes at bedtime. 10/15/15  Yes Historical Provider, MD  losartan (COZAAR) 25 MG tablet Take 25 mg by mouth daily.  10/01/15  Yes Historical Provider, MD  metFORMIN (GLUCOPHAGE) 500 MG tablet TAKE 1 TABLET BY MOUTH TWICE DAILY 01/07/15  Yes Sharee Holstereborah S Green, NP  Multiple Vitamins-Minerals (MULTIVITAMIN WITH MINERALS) tablet Take 1 tablet by mouth daily.   Yes Historical Provider, MD  OXYGEN Inhale 2 L into the lungs continuous.   Yes Historical Provider, MD  pantoprazole (PROTONIX) 40 MG tablet TAKE 1 TABLET BY MOUTH EVERY NIGHT 01/07/15  Yes Sharee Holstereborah S Green, NP  polyethylene glycol (MIRALAX / GLYCOLAX) packet Take 17 g by mouth daily as needed for mild constipation. 10/24/15  Yes Jeralyn BennettEzequiel Zamora, MD  potassium chloride SA (K-DUR,KLOR-CON) 20 MEQ tablet Take 1 tablet (20 mEq total) by mouth daily. Take while on lasix 12/12/14  Yes Jeanella CrazeBrandi L Ollis, NP  predniSONE (DELTASONE) 5 MG tablet Take 5 mg by mouth daily with breakfast.   Yes Historical Provider, MD  PRESCRIPTION MEDICATION Inhale into the lungs continuous. CPAP   Yes Historical Provider, MD  amLODipine (NORVASC) 10 MG tablet Take 1 tablet (10 mg total) by mouth daily. Patient not taking: Reported on 02/28/2016 10/25/15   Jeralyn BennettEzequiel Zamora, MD    Physical Exam: Vitals:   02/28/16 0800 02/28/16 0815 02/28/16 0830 02/28/16 0845  BP: (!) 134/48 (!) 127/51 (!) 117/51 (!) 110/54  Pulse: 98 99 101 100  Resp: (!) 29 (!) 33 (!) 30 25  Temp:      TempSrc:      SpO2: 100% 97% 99% 98%      Constitutional: Moderate distress as evidenced by ongoing tachypnea and increased work of breathing and persistent  lethargy  Eyes: PERRL, lids  with. Orbital edema, conjunctivae mildly edematous as well ENMT:  unable to assess adequately due to BiPAP mask in place  Neck: normal, supple, no masses, no thyromegaly Respiratory:  coarse to auscultation primarily on the right with expiratory rhonchi most notable in the mid fields, markedly diminished lung sounds in the left with bibasilar diminished breath sounds as well, BiPAP in place, respiratory rate 30s, use of accessory muscles with breathing  Cardiovascular: Regular  mildly tachycardic rate and rhythm, grade 2/6 systolic  Murmur, no rubs / gallops. No  lower extremity edema. 2+ pedal pulses. No carotid bruits.  Abdomen: focal  tenderness with upper quadrant , no masses palpated. No hepatosplenomegaly. Bowel sounds positive.  Musculoskeletal: no clubbing / cyanosis. No joint deformity upper and lower extremities. Good ROM, no contractures. Normal muscle tone.  Skin: no rashes, lesions, ulcers. No induration Neurologic: CN 2-12 grossly intact. Sensation intact, DTR normal. Strength 4-5/5 x all 4 extremities consistent with generalized weakness .  Psychiatric: quite lethargic with repeated verbal and tactile stimulation will awaken an attempt to answer some questions but quickly drifts back off to sleep     Labs on Admission: I have personally reviewed following labs and imaging studies  CBC:  Recent Labs Lab 02/28/16 0647 02/28/16 0653  WBC 24.6*  --   NEUTROABS 21.1*  --   HGB 8.3* 10.2*  HCT 30.4* 30.0*  MCV 84.0  --   PLT 274  --    Basic Metabolic Panel:  Recent Labs Lab 02/28/16 0647 02/28/16 0653  NA 144 142  K 4.2 4.0  CL 101 98*  CO2 32  --   GLUCOSE 183* 176*  BUN 24* 29*  CREATININE 0.96 1.00  CALCIUM 9.4  --    GFR: CrCl cannot be calculated (Unknown ideal weight.). Liver Function Tests:  Recent Labs Lab 02/28/16 0647  AST 22  ALT 17  ALKPHOS 61  BILITOT 0.6  PROT 6.3*  ALBUMIN 3.2*   No results for input(s):  LIPASE, AMYLASE in the last 168 hours. No results for input(s): AMMONIA in the last 168 hours. Coagulation Profile: No results for input(s): INR, PROTIME in the last 168 hours. Cardiac Enzymes: No results for input(s): CKTOTAL, CKMB, CKMBINDEX, TROPONINI in the last 168 hours. BNP (last 3 results) No results for input(s): PROBNP in the last 8760 hours. HbA1C: No results for input(s): HGBA1C in the last 72 hours. CBG: No results for input(s): GLUCAP in the last 168 hours. Lipid Profile: No results for input(s): CHOL, HDL, LDLCALC, TRIG, CHOLHDL, LDLDIRECT in the last 72 hours. Thyroid Function Tests: No results for input(s): TSH, T4TOTAL, FREET4, T3FREE, THYROIDAB in the last 72 hours. Anemia Panel: No results for input(s): VITAMINB12, FOLATE, FERRITIN, TIBC, IRON, RETICCTPCT in the last 72 hours. Urine analysis:    Component Value Date/Time   COLORURINE YELLOW 11/25/2015 0100   APPEARANCEUR CLOUDY (A) 11/25/2015 0100   LABSPEC 1.022 11/25/2015 0100   PHURINE 6.0 11/25/2015 0100   GLUCOSEU NEGATIVE 11/25/2015 0100   HGBUR NEGATIVE 11/25/2015 0100   BILIRUBINUR SMALL (A) 11/25/2015 0100   KETONESUR 15 (A) 11/25/2015 0100   PROTEINUR 100 (A) 11/25/2015 0100   NITRITE NEGATIVE 11/25/2015 0100   LEUKOCYTESUR NEGATIVE 11/25/2015 0100   Sepsis Labs: @LABRCNTIP (procalcitonin:4,lacticidven:4) )No results found for this or any previous visit (from the past 240 hour(s)).   Radiological Exams on Admission: Dg Chest Port 1 View  Result Date: 02/28/2016 CLINICAL DATA:  Shortness of breath today. EXAM: PORTABLE CHEST 1 VIEW COMPARISON:  Radiographs 11/24/2015.  CT 03/24/2015 FINDINGS: Left lung base opacity, present on prior exams but increased currently. Possible small left pleural effusion. Mucous plugging/ airway impaction seen on prior CT. Mild vascular congestion. There is bronchial thickening. Normal heart size and mediastinal contours. Atherosclerosis of the aortic arch. No  pneumothorax. No acute osseous abnormalities. IMPRESSION: Left lung base opacity, present on prior exams but increased currently. Mucous plugging/ airway impaction seen on prior CT. Findings suggest recurrence/progression of bronchial filling. This may be infectious or inflammatory. Aspiration is not excluded but felt less likely. Vascular congestion.  Mild bronchial thickening. Thoracic aortic atherosclerosis. Electronically Signed   By: Rubye Oaks M.D.   On: 02/28/2016 06:58    EKG: (Independently reviewed) sinus tachycardia with ventricular rate 103 bpm, QTC 449 ms, no definitive ischemic changes   Assessment/Plan Principal Problem:   Acute on chronic respiratory failure with hypoxia and hypercapnia  -Stat ABG obtained: PH 7.24, PCO2 85.5, PO2 63, ABG 7, bicarbonate 36.6 -Patient presents with abrupt altered mental status and increasing respiratory symptoms after failed outpatient antibiotic treatment for presumed community-acquired pneumonia versus COPD related bronchitis -Confirmed with daughter DO NOT INTUBATE; receiving hospice services at home otherwise -Continue BiPAP-wean as tolerated -Other supportive care (see below) -On rather high-dose Xanax twice a day-we'll substitute Ativan IV 0.5 mg twice a day -On chronic narcotics therefore will not utilize Narcan to reverse possible sedative effects  Active Problems:   Severe chronic obstructive pulmonary disease  -On 2 L oxygen at home and nocturnal BiPAP which she only tolerates for about 4 hours nightly -Continue DuoNebs q 4 hrs -NPO so we'll utilize Solu-Medrol 40 mg IV daily to replace prednisone 5 mg daily -Budesonide nebs every 12 hours    HCAP (healthcare-associated pneumonia) -Failed outpatient Omnicef -Has chronic left lower lobe opacity which was best clarified on CT of the chest in February  2017 and was consistent with small airway impaction with posterior left lower lobe mild collapse/consolidation versus atypical  infection -Repeat CT chest this admission -Recently hospitalized in November 2017 and this included short-term SNF stay so we'll treat as healthcare acquired -Sputum and blood cultures -With underlying COPD check Legionella urine -Urinary strep -Influenza PCR    Sepsis  -Presents with the following sepsis physiology: Altered mental status, white count greater than 12,000 with bands greater than 10%, respiratory rate greater than 20, heart rate greater than 90 -Ck Lactic acid and Procalcitonin -Follow up on blood cultures. Culture -Add urinalysis and culture -Of note appears to have a degree of persistent leukocytosis noting last normal/nonelevated white count was at the end of September 2017 with last CBC obtained just prior to discharge on 11/29/15 with a reading of 17,000 noting this was a trend downward -Follow up on CT of the chest -May need formal pulmonary medicine consultation although if has significant lung collapse likely not a candidate for fiberoptic bronchoscopy treatment    Acute LUQ pain -Patient had a fall at home with reports of right-sided pain post fall but on clinical exam has repeatedly experienced reproducible left upper quadrant abdominal pain with palpation -Ck CT abdomen and pelvis to rule out acute abdominal injury such as splenic fracture (hemoglobin slightly lower than baseline at at 8.3) -Check lipase    HTN (hypertension) -Initially was hypertensive but most recent readings have been normotensive -NPO and with altered mental status so will hold oral medications including antihypertensives for now    Chronic diastolic congestive heart failure  -Appears compensated with a degree of dehydration from acute illness  -Hold Lasix and Cozaar -Cautious administration of IV fluids at 75 mL per hour    Esophageal dysmotility -Noted on esophagram last November with a documented 50% narrowing of the diameter of the esophagus at level C5-C6 with additional findings  consistent with esophageal spasm -Likely would benefit from formal SLP evaluation for diet appropriateness prior to initiation of diet (planned but not completed during November admission)    DM type 2 (diabetes mellitus, type 2)  -On metformin at home -CBGs -SSI -Hemoglobin A1c    Hyperlipidemia -Holding home Lipitor while NPO    Physical deconditioning/ Protein-calorie malnutrition, severe -Ambulate with assistance -Apparently on hospice services at home -Consider nutrition consultation    Normocytic anemia -Baseline hemoglobin appears to be between 9 and 10.2 with current hemoglobin 8.3 -Follow trends -If CT abdomen/pelvis shows evidence of splenic injury may need to follow CBC more frequently       DVT prophylaxis:  SCDs ; no splenic injury on CT can initiate Lovenox  Code Status:  DO NOT INTUBATE otherwise full  Family Communication:  daughter at bedside  Disposition Plan: anticipate discharge back to preadmission home environment ; previous admission require short-term skilled nurse facility placement for rehabilitation so may require PT/OT evaluation prior to discharge  Consults called: none      ELLIS,ALLISON L. ANP-BC [facility] Pager [phone]   If 7PM-7AM, please contact night-coverage [email or web address] Password [email or web address]  02/28/2016, 9:10 AM

## 2016-02-28 NOTE — Progress Notes (Signed)
PHARMACY NOTE:  ANTIMICROBIAL RENAL DOSAGE ADJUSTMENT  Current antimicrobial regimen includes a mismatch between antimicrobial dosage and estimated renal function.  As per policy approved by the Pharmacy & Therapeutics and Medical Executive Committees, the antimicrobial dosage will be adjusted accordingly.  Current antimicrobial dosage:  Cefepime 1g IVq8h  Indication: Pneumonia/Sepsis  Renal Function: CrCl~44 mL/min (based on weight from November)    Antimicrobial dosage has been changed to:  Cefepime 1g IVq24h  Additional comments: When weight is updated, then dose will be adjusted accordingly based on Creatinine and weight.   Thank you for allowing pharmacy to be a part of this patient's care.  Alfredo BachJoseph Ronit Marczak, Cleotis NipperBS, PharmD Clinical Pharmacy Resident [phone] (Pager) 02/28/2016 8:18 AM

## 2016-02-28 NOTE — ED Notes (Signed)
Sent add on lab for hemoglobin A1c

## 2016-02-28 NOTE — ED Notes (Signed)
Admitting MD at the bedside. Will continue BiPap and contact the daughter about pt's wishes.

## 2016-02-28 NOTE — ED Notes (Signed)
Antibiotics started before blood cultures ordered.  °

## 2016-02-28 NOTE — Progress Notes (Addendum)
Pharmacy Antibiotic Note  Nicole Maxwell is a 71 y.o. female admitted on 02/28/2016 with pneumonia.  Pharmacy has been consulted for vancomycin dosing.  CXR shows left lung base opacity that is increased from prior exams. Afebrile, WBC elevated at 24.6. SCr stable, CrCl ~3640ml/min.  Plan: Give vancomycin 1g IV x 1, then start vancomycin 750mg  IV Q24 Continue cefepime 1g IV Q24 per MD Monitor clinical picture, renal function, VT prn F/U C&S, abx deescalation / LOT    Temp (24hrs), Avg:97.8 F (36.6 C), Min:97.8 F (36.6 C), Max:97.8 F (36.6 C)   Recent Labs Lab 02/28/16 0647 02/28/16 0653  WBC 24.6*  --   CREATININE 0.96 1.00    CrCl cannot be calculated (Unknown ideal weight.).    Allergies  Allergen Reactions  . Brovana [Arformoterol Tartrate] Shortness Of Breath and Cough    General intolerance  . Budesonide Shortness Of Breath and Cough    General intolerance  . Mucomyst [Acetylcysteine] Shortness Of Breath    Antimicrobials this admission: Azithromycin 2/3 x 1 Cefepime 2/3 >>  Vancomycin 2/3 >>  Dose adjustments this admission: n/a  Microbiology results: 2/3 BCx: sent 2/3 UCx: sent  2/3 Sputum: sent   Thank you for allowing pharmacy to be a part of this patient's care.  Enzo BiNathan Hollin Crewe, PharmD, BCPS Clinical Pharmacist Pager [phone] 02/28/2016 8:16 AM

## 2016-02-28 NOTE — ED Provider Notes (Signed)
MSE was initiated and I personally evaluated the patient and placed orders (if any) at  6:42 AM on February 28, 2016.  The patient appears stable so that the remainder of the MSE may be completed by another provider.  71 yo F with a chief complaint of shortness of breath. Patient has been placed in hospice secondary to her end-stage COPD. Patient with worsening shortness of breath this morning was unable to get up.  On my exam the patient is not speaking. Appears comfortably tachypnic.  Diffuse rhonchi. Prolonged expiration. Trace lower extremity edema. JVD up to mid neck. Labs in EKG and chest x-ray ordered. We'll give 3 DuoNeb's back-to-back.   [name], DO 02/28/16 2324

## 2016-02-28 NOTE — ED Triage Notes (Signed)
Pt arrives to the ED from home by [facility] for SO[facility]. Pt went to the bathroom and couldn't get up. Repeatedly stated help me, help me. [facility] [phone]). Oxycodone was increased, in the past this has resulted in an increase of Co2. Vitals stable.

## 2016-02-28 NOTE — ED Notes (Signed)
Pt requests to speak to RN. RN unclips BiPap and pt requests to be removed from BiPap "for a break". RT called to room. BiPap removed and pt placed on nasal cannula at 5L. Dr. Melynda RippleHobbs notified. Will continue to monitor.

## 2016-02-29 ENCOUNTER — Inpatient Hospital Stay (HOSPITAL_COMMUNITY)

## 2016-02-29 LAB — COMPREHENSIVE METABOLIC PANEL
ALBUMIN: 2.5 g/dL — AB (ref 3.5–5.0)
ALK PHOS: 50 U/L (ref 38–126)
ALT: 14 U/L (ref 14–54)
ANION GAP: 10 (ref 5–15)
AST: 17 U/L (ref 15–41)
BUN: 16 mg/dL (ref 6–20)
CHLORIDE: 101 mmol/L (ref 101–111)
CO2: 32 mmol/L (ref 22–32)
Calcium: 8.6 mg/dL — ABNORMAL LOW (ref 8.9–10.3)
Creatinine, Ser: 0.72 mg/dL (ref 0.44–1.00)
GFR calc Af Amer: >60 mL/min (ref >60)
GFR calc non Af Amer: >60 mL/min (ref >60)
GLUCOSE: 107 mg/dL — AB (ref 65–99)
Potassium: 4.3 mmol/L (ref 3.5–5.1)
SODIUM: 143 mmol/L (ref 135–145)
Total Bilirubin: 0.4 mg/dL (ref 0.3–1.2)
Total Protein: 5.4 g/dL — ABNORMAL LOW (ref 6.5–8.1)

## 2016-02-29 LAB — CBC
HCT: 26.3 % — ABNORMAL LOW (ref 36.0–46.0)
HEMOGLOBIN: 7.6 g/dL — AB (ref 12.0–15.0)
MCH: 23.5 pg — AB (ref 26.0–34.0)
MCHC: 28.9 g/dL — AB (ref 30.0–36.0)
MCV: 81.2 fL (ref 78.0–100.0)
Platelets: 223 10*3/uL (ref 150–400)
RBC: 3.24 MIL/uL — ABNORMAL LOW (ref 3.87–5.11)
RDW: 17.8 % — ABNORMAL HIGH (ref 11.5–15.5)
WBC: 24.7 10*3/uL — ABNORMAL HIGH (ref 4.0–10.5)

## 2016-02-29 LAB — GLUCOSE, CAPILLARY
GLUCOSE-CAPILLARY: 119 mg/dL — AB (ref 65–99)
GLUCOSE-CAPILLARY: 122 mg/dL — AB (ref 65–99)
GLUCOSE-CAPILLARY: 83 mg/dL (ref 65–99)
Glucose-Capillary: 232 mg/dL — ABNORMAL HIGH (ref 65–99)
Glucose-Capillary: 229 mg/dL — ABNORMAL HIGH (ref 65–99)
Glucose-Capillary: 134 mg/dL — ABNORMAL HIGH (ref 65–99)

## 2016-02-29 LAB — URINE CULTURE

## 2016-02-29 LAB — EXPECTORATED SPUTUM ASSESSMENT W GRAM STAIN, RFLX TO RESP C

## 2016-02-29 LAB — HEMOGLOBIN A1C
Hgb A1c MFr Bld: 6 % — ABNORMAL HIGH (ref 4.8–5.6)
MEAN PLASMA GLUCOSE: 126 mg/dL

## 2016-02-29 MED ORDER — INSULIN ASPART 100 UNIT/ML ~~LOC~~ SOLN: 0.0000 [IU] | Freq: Every day | SUBCUTANEOUS | Status: DC

## 2016-02-29 MED ORDER — ALPRAZOLAM 0.5 MG PO TABS
0.5000 mg | ORAL_TABLET | Freq: Two times a day (BID) | ORAL | Status: DC | PRN
  Administered 2016-02-29 – 2016-03-01 (×2): 0.5 mg via ORAL
  Filled 2016-02-29 (×2): qty 1

## 2016-02-29 MED ORDER — IPRATROPIUM-ALBUTEROL 0.5-2.5 (3) MG/3ML IN SOLN
3.0000 mL | Freq: Four times a day (QID) | RESPIRATORY_TRACT | Status: DC | PRN
  Administered 2016-02-29: 3 mL via RESPIRATORY_TRACT

## 2016-02-29 MED ORDER — ALPRAZOLAM 0.5 MG PO TABS
1.0000 mg | ORAL_TABLET | Freq: Every day | ORAL | Status: DC
  Administered 2016-02-29 – 2016-03-03 (×4): 1 mg via ORAL
  Filled 2016-02-29 (×4): qty 2

## 2016-02-29 MED ORDER — INSULIN ASPART 100 UNIT/ML ~~LOC~~ SOLN
0.0000 [IU] | Freq: Three times a day (TID) | SUBCUTANEOUS | Status: DC
  Administered 2016-02-29 (×2): 3 [IU] via SUBCUTANEOUS
  Administered 2016-03-04: 2 [IU] via SUBCUTANEOUS

## 2016-02-29 MED ORDER — ALPRAZOLAM 0.5 MG PO TABS: 1.0000 mg | ORAL_TABLET | Freq: Once | ORAL | Status: DC

## 2016-02-29 MED ORDER — ALBUTEROL SULFATE (2.5 MG/3ML) 0.083% IN NEBU
2.5000 mg | INHALATION_SOLUTION | RESPIRATORY_TRACT | Status: DC | PRN
Start: 2016-02-29 — End: 2016-03-04

## 2016-02-29 MED ORDER — HYDROCODONE-ACETAMINOPHEN 5-325 MG PO TABS
1.0000 | ORAL_TABLET | ORAL | Status: DC | PRN
  Administered 2016-03-03: 1 via ORAL
  Filled 2016-02-29: qty 1

## 2016-02-29 MED ORDER — PREDNISONE 10 MG PO TABS
5.0000 mg | ORAL_TABLET | Freq: Every day | ORAL | Status: DC
  Administered 2016-03-01 – 2016-03-04 (×4): 5 mg via ORAL
  Filled 2016-02-29 (×4): qty 1

## 2016-02-29 NOTE — Progress Notes (Signed)
RT placed patient on BIPAP auto 3L O2 bleed in needed. Patient tolerating well at this time.

## 2016-02-29 NOTE — Evaluation (Signed)
Clinical/Bedside Swallow Evaluation Patient Details  Name: Nicole Maxwell MRN: [id number] Date of Birth: 1945-08-11  Today's Date: 02/29/2016 Time: SLP Start Time (ACUTE ONLY): 1030 SLP Stop Time (ACUTE ONLY): 1038 SLP Time Calculation (min) (ACUTE ONLY): 8 min  Past Medical History:  Past Medical History:  Diagnosis Date  . Alcohol abuse, in remission quit 2004  . Anemia of chronic disease   . Anxiety    Xanax prn  . Arthritis    "my whole body"  . Asthma   . Chronic back pain    "goes from the lower part of my back on down my legs"  (04/17/2014)  . Chronic respiratory failure with hypercapnia (HCC)   . COPD (chronic obstructive pulmonary disease) (HCC)    cxr 04/03/2010 - hyperinflation  . COPD exacerbation (HCC)   . Dysphagia   . Esophageal dysmotility   . HCAP (healthcare-associated pneumonia)   . Hyperlipidemia   . Hypertension   . Leukocytosis   . Neuromuscular disorder (HCC)    lumbar disc  . On home oxygen therapy    "2L; suppose to be 24/7" (04/17/2014)  . Osteopenia   . Physical deconditioning   . Pneumonia    "3 times in the last month or so" (04/17/2014)  . Protein calorie malnutrition (HCC)   . PVD (peripheral vascular disease) (HCC)   . Shortness of breath    with anxiety  and activty  . Tobacco abuse    " I am addicted"   . Type II diabetes mellitus (HCC)   . Vitamin D deficiency disease    03/26/2010 - 9.9ng/mL. Started on replacemnt   Past Surgical History:  Past Surgical History:  Procedure Laterality Date  . ABDOMINAL HYSTERECTOMY     "partial"  . APPENDECTOMY  1960  . BACK SURGERY    . CARPAL TUNNEL RELEASE Bilateral    CTS repair 2000 (R), 2006 (L)/notes 01/01/2014  . CATARACT EXTRACTION W/ INTRAOCULAR LENS  IMPLANT, BILATERAL Bilateral 2015  . LUMBAR LAMINECTOMY/DECOMPRESSION MICRODISCECTOMY  2005   L4-5/notes 03/06/2010  . POSTERIOR LUMBAR FUSION  2002  . TONSILLECTOMY  1960   HPI:  Nicole Maxwell a 71 y.o.femalewith a Past Medical History  significant for chronic respiratory failure with hypoxia, COPD, chronic steroids, high blood pressure, dysphagia, asthma who presents with acute encephalopathy and respiratory distress. Diagnosis acute on chronic respiratory failure with hypoxia and hypercarbia. Multifactorial presentation. Patient is to be DO NOT INTUBATE. Pts history of dysphagia is primarily esophageal with diagnosed dysmotility. MBS in 2016 shows deep flash penetraiton with thin liquids but consistent airway protection and second swallow to clear vallecular residual.    Assessment / Plan / Recommendation Clinical Impression  Pt presents with no signs of aspriation with PO. History includes primary esophageal dysphagia though pt denies any difficulty with this currently. Reinforced basic precautions. Given that prior MBS showed adequate airway protection and pt appears to tolerate diet, no f/u warranted. Pt to continue current diet. SLP will sign off.     Aspiration Risk  Mild aspiration risk    Diet Recommendation Regular;Thin liquid   Liquid Administration via: Cup;Straw Medication Administration: Whole meds with liquid Supervision: Patient able to self feed Postural Changes: Seated upright at 90 degrees;Remain upright for at least 30 minutes after po intake    Other  Recommendations     Follow up Recommendations        Frequency and Duration            Prognosis  Swallow Study   General HPI: Nicole Abidelta S Bakeris a 71 y.o.femalewith a Past Medical History significant for chronic respiratory failure with hypoxia, COPD, chronic steroids, high blood pressure, dysphagia, asthma who presents with acute encephalopathy and respiratory distress. Diagnosis acute on chronic respiratory failure with hypoxia and hypercarbia. Multifactorial presentation. Patient is to be DO NOT INTUBATE. Pts history of dysphagia is primarily esophageal with diagnosed dysmotility. MBS in 2016 shows deep flash penetraiton with thin liquids  but consistent airway protection and second swallow to clear vallecular residual.  Type of Study: Bedside Swallow Evaluation Previous Swallow Assessment: see HPI Diet Prior to this Study: Regular;Thin liquids Temperature Spikes Noted: Yes Respiratory Status: Nasal cannula History of Recent Intubation: No Behavior/Cognition: Alert;Cooperative;Pleasant mood;Confused Oral Cavity Assessment: Within Functional Limits Oral Care Completed by SLP: No Oral Cavity - Dentition: Adequate natural dentition Vision: Functional for self-feeding Self-Feeding Abilities: Able to feed self Patient Positioning: Upright in bed Baseline Vocal Quality: Hoarse Volitional Cough: Strong Volitional Swallow: Able to elicit    Oral/Motor/Sensory Function Overall Oral Motor/Sensory Function: Within functional limits   Ice Chips     Thin Liquid Thin Liquid: Within functional limits Presentation: Cup;Straw;Self Fed    Nectar Thick Nectar Thick Liquid: Not tested   Honey Thick Honey Thick Liquid: Not tested   Puree Puree: Within functional limits   Solid   GO   Solid: Within functional limits Presentation: Self Nicole Maxwell       Nicole Waller, MA CCC-SLP [phone]  Nicole Maxwell, Riley NearingBonnie Maxwell 02/29/2016,10:58 AM

## 2016-02-29 NOTE — Progress Notes (Signed)
[place] TEAM 1 - Stepdown/ICU TEAM  VICTORIOUS COSIO  ZOX:[id number] DOB: Dec 18, 1945 DOA: 02/28/2016 PCP: [name], MD    Brief Narrative:  71 y.o. female with history of severe COPD on chronic nasal cannula oxygen as well as nocturnal BiPAP,  steroid-induced diabetes, hypertension, intermittent dysphagia related to esophageal dysmotility, physical deconditioning and protein calorie malnutrition, normocytic anemia, and dyslipidemia. Patient was seen by her PCP on 1/23 because of URI symptoms and prescribed Omnicef without any improvement.  On the day of admission EMS was called after the patient went to the bathroom and "couldn't get up".   Subjective: The patient is alert and pleasant but mildly confused.  She continues to complain of right upper quadrant superficial abdominal pain as well as shortness of breath.  She is wheezing audibly but appears to be tolerating it without significant distress.  Assessment & Plan:  Acute on chronic respiratory failure with hypoxia and hypercapnia  ABG at presentation:  pH7.24, pCO2 85.5, pO2 63 - appears to have improved w/ holding of sedating meds and short term use of BIPAP - care w/ meds - BIPAP prn   Acute encephalopathy / acute delirium  Baseline mental status not clear to me - she presently is mildly confused but able to provide some hx and answer many questions appropriately - minimize sedating meds - check B12 and folate   Severe chronic obstructive pulmonary disease w/ acute exacerbation  on 2 L oxygen at home and nocturnal BiPAP which she only tolerates for about 4 hours nightly - wean back to home O2 dose as able - cont maximal medical care for COPD - followed by Dr Marchelle Gearing as outpt   Chronic LLL opacity - ?acute PNA chronic LLL opacity clarified on CT chest Feb 2017 consistent with small airway impaction with posterior left lower lobe mild collapse/consolidation versus atypical infection - CXR w/o signif change to my exam presently - cont  empiric abx for 3 day course and follow   Acute RUQ abdom pain Reported fall at home but details scant - CT abdom w/o acute findings - most c/w superficial injury likely due to fall   HTN Blood pressures currently well-controlled  Chronic diastolic congestive heart failure  Baseline wgt ~54kg - no evidence of acute exacerbation at this time   [facility] Weights   02/28/16 1651  Weight: 53.7 kg (118 lb 6.2 oz)    Esophageal dysmotility esophagram Nov 2017 documented 50% narrowing at C5-C6 level with additional findings consistent with esophageal spasm - cleared for regular diet per SLP this admit   DM 2 CBG currently controlled  Physical deconditioning   Normocytic anemia Baseline hemoglobin appears to be 9 - 10.2 - no evidence of gross blood loss - follow Hgb w/ downward trend    DVT prophylaxis: SCDs Code Status: DO NOT INTUBATE - LIMITED CODE Family Communication: no family present at time of exam  Disposition Plan: SDU  Consultants:  none  Procedures: none  Antimicrobials:  Azithromycin 2/3 Cefepime 2/3 > Ceftriaxone 2/3 Vancomycin 2/3 > 2/4  Objective: Blood pressure 124/60, pulse (!) 104, temperature 99 F (37.2 C), temperature source Oral, resp. rate 19, height 5\' 5"  (1.651 m), weight 53.7 kg (118 lb 6.2 oz), SpO2 100 %.  Intake/Output Summary (Last 24 hours) at 02/29/16 1110 Last data filed at 02/29/16 0900  Gross per 24 hour  Intake             2105 ml  Output  300 ml  Net             1805 ml   Filed Weights   02/28/16 1651  Weight: 53.7 kg (118 lb 6.2 oz)    Examination: General: Tachypnea and wheezing appreciated Lungs: Diffuse expiratory wheezing with poor air movement in left base and distant breath sounds throughout other fields Cardiovascular: Tachycardic but regular with no appreciable murmur but distant heart sounds Abdomen: Tender superficially in right upper quadrant but no palpable mass or other abnormality - bowel sounds  positive - non-distended - no rebound Extremities: No significant cyanosis, clubbing, or edema bilateral lower extremities  CBC:  Recent Labs Lab 02/28/16 0647 02/28/16 0653 02/29/16 0648  WBC 24.6*  --  24.7*  NEUTROABS 21.1*  --   --   HGB 8.3* 10.2* 7.6*  HCT 30.4* 30.0* 26.3*  MCV 84.0  --  81.2  PLT 274  --  223   Basic Metabolic Panel:  Recent Labs Lab 02/28/16 0647 02/28/16 0653 02/29/16 0648  NA 144 142 143  K 4.2 4.0 4.3  CL 101 98* 101  CO2 32  --  32  GLUCOSE 183* 176* 107*  BUN 24* 29* 16  CREATININE 0.96 1.00 0.72  CALCIUM 9.4  --  8.6*   GFR: Estimated Creatinine Clearance: 54.7 mL/min (by C-G formula based on SCr of 0.72 mg/dL).  Liver Function Tests:  Recent Labs Lab 02/28/16 0647 02/29/16 0648  AST 22 17  ALT 17 14  ALKPHOS 61 50  BILITOT 0.6 0.4  PROT 6.3* 5.4*  ALBUMIN 3.2* 2.5*    Recent Labs Lab 02/28/16 0647  LIPASE 16   Coagulation Profile:  Recent Labs Lab 02/28/16 0815  INR 0.97    HbA1C: Hgb A1c MFr Bld  Date/Time Value Ref Range Status  10/17/2015 05:36 PM 6.4 (H) 4.8 - 5.6 % Final    Comment:    (NOTE)         Pre-diabetes: 5.7 - 6.4         Diabetes: >6.4         Glycemic control for adults with diabetes: <7.0   11/26/2014 10:05 AM 6.3 (H) 4.8 - 5.6 % Final    Comment:    (NOTE)         Pre-diabetes: 5.7 - 6.4         Diabetes: >6.4         Glycemic control for adults with diabetes: <7.0     CBG:  Recent Labs Lab 02/28/16 1656 02/28/16 2003 02/29/16 0036 02/29/16 0325 02/29/16 0818  GLUCAP 145* 245* 122* 83 119*    Recent Results (from the past 240 hour(s))  MRSA PCR Screening     Status: None   Collection Time: 02/28/16  5:34 PM  Result Value Ref Range Status   MRSA by PCR NEGATIVE NEGATIVE Final    Comment:        The GeneXpert MRSA Assay (FDA approved for NASAL specimens only), is one component of a comprehensive MRSA colonization surveillance program. It is not intended to  diagnose MRSA infection nor to guide or monitor treatment for MRSA infections.   Culture, sputum-assessment     Status: None   Collection Time: 02/29/16  4:48 AM  Result Value Ref Range Status   Specimen Description SPUTUM  Final   Special Requests NONE  Final   Sputum evaluation   Final    Sputum specimen not acceptable for testing.  Please recollect.   Gram Stain  Report Called to,Read Back By and Verified With: R. MYRICK RN, AT [id number] 02/29/16 BY Renato Shin. VANHOOK    Report Status 02/29/2016 FINAL  Final     Scheduled Meds: . budesonide (PULMICORT) nebulizer solution  0.25 mg Nebulization BID  . ceFEPime (MAXIPIME) IV  1 g Intravenous Q24H  . enoxaparin (LOVENOX) injection  40 mg Subcutaneous Q24H  . fluticasone  1 spray Each Nare BID  . insulin aspart  0-9 Units Subcutaneous Q4H  . ipratropium-albuterol  3 mL Nebulization QID  . latanoprost  1 drop Both Eyes QHS  . LORazepam  0.5 mg Intravenous Q12H  . methylPREDNISolone (SOLU-MEDROL) injection  40 mg Intravenous Daily  . sodium chloride flush  3 mL Intravenous Q12H  . vancomycin  750 mg Intravenous Q24H     LOS: 1 day   Lonia BloodJeffrey T. Advaith Lamarque, MD [facility] Office  [phone] Pager - Text Page per Loretha StaplerAmion as per below:  On-Call/Text Page:      [email or web address]      password [email or web address]  If 7PM-7AM, please contact night-coverage [email or web address] Password [email or web address] 02/29/2016, 11:10 AM

## 2016-02-29 NOTE — Care Management (Signed)
[facility] GIP Visit 4E 6  This is a related, covered GIP admission from 02/28/16 per MD Patric DykesJohn Feldmann.  Admit to [facility] with COPD.  [facility]16 with presentation of acute respiratory failure and altered mentation.  Conde status is DNI at this time.  She was placed on BiPap in ED on arrival; she is now with O2 2L per .  RN visited with her in her room this AM and she verbalizes no concerns.  She is intermittently confused this AM.  Hospital RN Melina SchoolsRobyn notes that it is much improved from 02/28/16.  Robyn notes (-) Influenza today.  Patients daughter [name] is not present this AM currently.  She may be visiting later today. I have discussed with Robyn that [facility] RN and/or MSW will visit on tomorrow as well.    Please call with any hospice related questions or concerns  Thank you Chrisandra NettersCarlos S. Gwendolyn GrantWalden RN MSN [facility] ON CALL RN [phone]

## 2016-03-01 ENCOUNTER — Encounter: Payer: Self-pay | Admitting: Internal Medicine

## 2016-03-01 DIAGNOSIS — J9622 Acute and chronic respiratory failure with hypercapnia: Secondary | ICD-10-CM

## 2016-03-01 DIAGNOSIS — J9621 Acute and chronic respiratory failure with hypoxia: Secondary | ICD-10-CM

## 2016-03-01 LAB — GLUCOSE, CAPILLARY
GLUCOSE-CAPILLARY: 101 mg/dL — AB (ref 65–99)
Glucose-Capillary: 110 mg/dL — ABNORMAL HIGH (ref 65–99)
Glucose-Capillary: 102 mg/dL — ABNORMAL HIGH (ref 65–99)
Glucose-Capillary: 92 mg/dL (ref 65–99)

## 2016-03-01 LAB — CBC
HCT: 28.4 % — ABNORMAL LOW (ref 36.0–46.0)
HEMOGLOBIN: 7.9 g/dL — AB (ref 12.0–15.0)
MCH: 22.5 pg — AB (ref 26.0–34.0)
MCHC: 27.8 g/dL — AB (ref 30.0–36.0)
MCV: 80.9 fL (ref 78.0–100.0)
Platelets: 269 10*3/uL (ref 150–400)
RBC: 3.51 MIL/uL — ABNORMAL LOW (ref 3.87–5.11)
RDW: 17.6 % — AB (ref 11.5–15.5)
WBC: 21.1 10*3/uL — ABNORMAL HIGH (ref 4.0–10.5)

## 2016-03-01 LAB — LEGIONELLA PNEUMOPHILA SEROGP 1 UR AG: L. PNEUMOPHILA SEROGP 1 UR AG: NEGATIVE

## 2016-03-01 MED ORDER — QUETIAPINE FUMARATE 25 MG PO TABS
12.5000 mg | ORAL_TABLET | Freq: Every day | ORAL | Status: DC
  Administered 2016-03-01 – 2016-03-03 (×3): 12.5 mg via ORAL
  Filled 2016-03-01 (×3): qty 1

## 2016-03-01 MED ORDER — GLUCERNA SHAKE PO LIQD
237.0000 mL | Freq: Three times a day (TID) | ORAL | Status: DC
  Administered 2016-03-01 – 2016-03-04 (×4): 237 mL via ORAL

## 2016-03-01 MED ORDER — PIPERACILLIN-TAZOBACTAM 3.375 G IVPB
3.3750 g | Freq: Three times a day (TID) | INTRAVENOUS | Status: DC
  Administered 2016-03-01 – 2016-03-04 (×8): 3.375 g via INTRAVENOUS
  Filled 2016-03-01 (×9): qty 50

## 2016-03-01 NOTE — Progress Notes (Signed)
[facility]   This is a related, covered GIP admission from 02/28/16 per MD Patric DykesJohn Feldmann.  Admit to [facility] with COPD.  [facility]16 with presentation of acute respiratory failure and altered mentation after EMS called to home because patient "could not get up". Code status is DNI at this time. Patient was seen by her PCP on 1/23 because of URI symptoms (cough and fever) and was prescribed Omnicef 300 mg twice a day without any improvement in her symptoms. Of note, the patient was scheduled to see her pulmonologist on 2/2 but because of recent falls and generalized weakness she was unable to attend that appointment.  Visited patient in room with no visitors at bedside. Patient was confused to place and time and trying to get OOB to "go home".  Safety precautions in effect with bilateral mitts noted on patient.  Patient complains of "soreness to back after a fall at home".  Bedside RN stated that patient is "intermittently confused and was briefly restrained last night for safety reasons".  Patient received prn  0.5mg  Xanax at 10:00am today with no other prns noted. Also, patient started on Glucerna shake supplement TID this morning.    Please call with any hospice related questions or concerns. Hospice will continue to see patient daily while in hospital.  Thank you Roda ShuttersSherry Gibson, [facility] Hospital Liaison [phone]  [facility] Liaisons schedule on Amion

## 2016-03-01 NOTE — Consult Note (Signed)
Name: Nicole Maxwell MRN: [id number] DOB: Sep 26, 1945    ADMISSION DATE:  02/28/2016 CONSULTATION DATE:  2/5  REFERRING MD :  Dr. Sharon Seller  CHIEF COMPLAINT:  SOB  BRIEF PATIENT DESCRIPTION: 71 year old female with severe COPD on home O2. She resides in SNF where she suffered a fall and was minimally responsive 2/3. Improved with COPD treatments and withholding of sedating meds. Family requesting pulmonary eval.  SIGNIFICANT EVENTS  2/3 admit for hypercarbic respiratory failure  STUDIES:  CT scan 2/3 > Aortic atherosclerosis. Coronary artery calcifications are noted suggesting coronary artery disease. Airspace opacities are noted in lingular segment of left lower lobe as well as left lower lobe most consistent with pneumonia with small associated left pleural effusion. Mild emphysematous disease is noted in the upper lobes bilaterally. Multilevel degenerative disc disease and postoperative changes are noted in the lower lumbar spine. Large left renal cysts are noted.    HISTORY OF PRESENT ILLNESS:  71 year old female with PMH as below, which is significant for COPD on 2L, esophageal dysmotility, HTN, DM, and malnutrition. She is currently residing in SNF and is followed by home hospice. She has several recent admissions for COPD exacerbations. 2/3 she was found on the floor of the SNF bathroom and was brought to ED. It was noted that she has recently had her dose of oxycodone increased and she was somnolent. Initial evaluation in ED demonstrated hypercarbia and she was started on BiPAP with typical COPD treatments she became more arousable. She has been treated with the usual COPD regimen of steroids, nebs, including her home nocturnal BiPAP. Her family requested pulmonary consultation 2/5.  PAST MEDICAL HISTORY :   has a past medical history of Alcohol abuse, in remission (quit 2004); Anemia of chronic disease; Anxiety; Arthritis; Asthma; Chronic back pain; Chronic respiratory failure with  hypercapnia (HCC); COPD (chronic obstructive pulmonary disease) (HCC); COPD exacerbation (HCC); Dysphagia; Esophageal dysmotility; HCAP (healthcare-associated pneumonia); Hyperlipidemia; Hypertension; Leukocytosis; Neuromuscular disorder (HCC); On home oxygen therapy; Osteopenia; Physical deconditioning; Pneumonia; Protein calorie malnutrition (HCC); PVD (peripheral vascular disease) (HCC); Shortness of breath; Tobacco abuse; Type II diabetes mellitus (HCC); and Vitamin D deficiency disease.  has a past surgical history that includes Lumbar laminectomy/decompression microdiscectomy (2005); Appendectomy (1960); Tonsillectomy (1960); Posterior lumbar fusion (2002); Abdominal hysterectomy; Back surgery; Carpal tunnel release (Bilateral); and Cataract extraction w/ intraocular lens  implant, bilateral (Bilateral, 2015). Prior to Admission medications   Medication Sig Start Date End Date Taking? Authorizing Provider  acetaminophen (TYLENOL) 500 MG tablet Take 500 mg by mouth every 6 (six) hours as needed for mild pain.   Yes Historical Provider, MD  albuterol (PROAIR HFA) 108 (90 Base) MCG/ACT inhaler Inhale 2 puffs into the lungs every 6 (six) hours as needed.    Yes Historical Provider, MD  albuterol (PROVENTIL) (2.5 MG/3ML) 0.083% nebulizer solution Take 3 mLs (2.5 mg total) by nebulization every 4 (four) hours as needed for wheezing or shortness of breath. (may take 1 every 4 hours as needed for wheezing/shortness of breath) DX: 496 01/09/14  Yes [name], MD  alprazolam Prudy Feeler) 2 MG tablet Take 1/2 tablet by mouth once daily; Take 1 tablet by mouth every night at bedtime Patient taking differently: Take 1-2 mg by mouth See admin instructions. 1mg  in am, 2mg  in pm 12/01/15  Yes Tiffany L Reed, DO  aspirin EC 81 MG EC tablet Take 1 tablet (81 mg total) by mouth daily. 01/09/14  Yes [name], MD  atorvastatin (LIPITOR) 20  MG tablet Take 20 mg by mouth daily.  09/14/14  Yes Historical Provider, MD    budesonide-formoterol (SYMBICORT) 160-4.5 MCG/ACT inhaler Inhale 2 puffs into the lungs 2 (two) times daily.   Yes Historical Provider, MD  cefdinir (OMNICEF) 300 MG capsule Take 300 mg by mouth 2 (two) times daily.   Yes Historical Provider, MD  Dextromethorphan-Guaifenesin (MUCINEX DM MAXIMUM STRENGTH) 60-1200 MG TB12 Take 1 tablet by mouth every 12 (twelve) hours.   Yes Historical Provider, MD  DULoxetine (CYMBALTA) 60 MG capsule Take 60 mg by mouth daily. Reported on 08/01/2015   Yes Historical Provider, MD  feeding supplement, GLUCERNA SHAKE, (GLUCERNA SHAKE) LIQD Take 237 mLs by mouth daily.   Yes Historical Provider, MD  fluticasone (FLONASE) 50 MCG/ACT nasal spray Place 1 spray into both nostrils 2 (two) times daily. 12/23/14  Yes Sharee Holster, NP  furosemide (LASIX) 20 MG tablet Take 20 mg by mouth daily.    Yes Historical Provider, MD  HYDROcodone-acetaminophen (NORCO/VICODIN) 5-325 MG tablet Take 1 tablet by mouth every 6 (six) hours as needed for moderate pain. Do not exceed 4gm of Tylenol in 24 hours Patient taking differently: Take 2 tablets by mouth every 6 (six) hours as needed for moderate pain. Do not exceed 4gm of Tylenol in 24 hours 12/01/15  Yes Tiffany L Reed, DO  hydroxypropyl methylcellulose / hypromellose (ISOPTO TEARS / GONIOVISC) 2.5 % ophthalmic solution Place 1 drop into both eyes 3 (three) times daily as needed for dry eyes.   Yes Historical Provider, MD  ipratropium-albuterol (DUONEB) 0.5-2.5 (3) MG/3ML SOLN INHALE 1 VIAL VIA NEBULIZER FOUR TIMES A DAY 01/08/16  Yes Kalman Shan, MD  latanoprost (XALATAN) 0.005 % ophthalmic solution Place 1 drop into both eyes at bedtime. 10/15/15  Yes Historical Provider, MD  losartan (COZAAR) 25 MG tablet Take 25 mg by mouth daily. 10/01/15  Yes Historical Provider, MD  metFORMIN (GLUCOPHAGE) 500 MG tablet TAKE 1 TABLET BY MOUTH TWICE DAILY 01/07/15  Yes Sharee Holster, NP  Multiple Vitamins-Minerals (MULTIVITAMIN WITH MINERALS)  tablet Take 1 tablet by mouth daily.   Yes Historical Provider, MD  OXYGEN Inhale 2 L into the lungs continuous.   Yes Historical Provider, MD  pantoprazole (PROTONIX) 40 MG tablet TAKE 1 TABLET BY MOUTH EVERY NIGHT 01/07/15  Yes Sharee Holster, NP  polyethylene glycol (MIRALAX / GLYCOLAX) packet Take 17 g by mouth daily as needed for mild constipation. 10/24/15  Yes Jeralyn Bennett, MD  potassium chloride SA (K-DUR,KLOR-CON) 20 MEQ tablet Take 1 tablet (20 mEq total) by mouth daily. Take while on lasix 12/12/14  Yes Jeanella Craze, NP  predniSONE (DELTASONE) 5 MG tablet Take 5 mg by mouth daily with breakfast.   Yes Historical Provider, MD  PRESCRIPTION MEDICATION Inhale into the lungs continuous. CPAP   Yes Historical Provider, MD  amLODipine (NORVASC) 10 MG tablet Take 1 tablet (10 mg total) by mouth daily. Patient not taking: Reported on 02/28/2016 10/25/15   Jeralyn Bennett, MD   Allergies  Allergen Reactions  . Brovana [Arformoterol Tartrate] Shortness Of Breath and Cough    General intolerance  . Budesonide Shortness Of Breath and Cough    General intolerance  . Mucomyst [Acetylcysteine] Shortness Of Breath    FAMILY HISTORY:  family history includes Breast cancer in her sister; Cancer in her brother; Heart attack in her brother; Heart attack (age of onset: 29) in her sister; Hypertension in her other. SOCIAL HISTORY:  reports that she has been smoking  Cigarettes.  She has a 14.00 pack-year smoking history. She has never used smokeless tobacco. She reports that she drinks alcohol. She reports that she does not use drugs.  REVIEW OF SYSTEMS:  Limited due to confusion Bolds are positive  Constitutional: weight loss, gain, night sweats, Fevers, chills, fatigue .  HEENT: headaches, Sore throat, sneezing, nasal congestion, post nasal drip, Difficulty swallowing, Tooth/dental problems, visual complaints visual changes, ear ache CV:  chest pain, radiates:,Orthopnea, PND, swelling in lower  extremities, dizziness, palpitations, syncope.  GI  heartburn, indigestion, abdominal pain, nausea, vomiting, diarrhea, change in bowel habits, loss of appetite, bloody stools.  Resp: cough, productive, hemoptysis, dyspnea, chest pain, pleuritic.  Skin: rash or itching or icterus GU: dysuria, change in color of urine, urgency or frequency. flank pain, hematuria  MS: joint pain or swelling. decreased range of motion  Psych: change in mood or affect. depression or anxiety.  Neuro: difficulty with speech, weakness, numbness, ataxia    SUBJECTIVE:   VITAL SIGNS: Temp:  [96.8 F (36 C)-99.5 F (37.5 C)] 98.7 F (37.1 C) (02/05 0748) Pulse Rate:  [96-105] 105 (02/05 0748) Resp:  [18-27] 19 (02/05 0748) BP: (116-147)/(66-97) 141/88 (02/05 0748) SpO2:  [90 %-99 %] 99 % (02/05 1252)  PHYSICAL EXAMINATION: General:  Frail cachectic female in NAD Neuro:  Alert, somewhat confused.  HEENT:  [place]/AT, PERRL, no JVD Cardiovascular:  RRR, no MRG Lungs:  Coarse bilateral breath sounds, no wheeze Abdomen:  Soft, non-tender, non distended Musculoskeletal:  No acute deformity Skin:  Grossly intact   Recent Labs Lab 02/28/16 0647 02/28/16 0653 02/29/16 0648  NA 144 142 143  K 4.2 4.0 4.3  CL 101 98* 101  CO2 32  --  32  BUN 24* 29* 16  CREATININE 0.96 1.00 0.72  GLUCOSE 183* 176* 107*    Recent Labs Lab 02/28/16 0647 02/28/16 0653 02/29/16 0648 03/01/16 1232  HGB 8.3* 10.2* 7.6* 7.9*  HCT 30.4* 30.0* 26.3* 28.4*  WBC 24.6*  --  24.7* 21.1*  PLT 274  --  223 269   Dg Chest 2 View  Result Date: 02/29/2016 CLINICAL DATA:  Sepsis EXAM: CHEST  2 VIEW COMPARISON:  February 28, 2016 FINDINGS: Persistent infiltrate in the lingula and left lower lobe, mildly worsened in the interval. No other changes. IMPRESSION: The lingular and left lower lobe infiltrate is mildly worsened in the interval. Electronically Signed   By: Gerome Samavid  Williams III M.D   On: 02/29/2016 07:43    ASSESSMENT /  PLAN:  Acute on chronic hypoxemic/hypercarbic respiratory failure secondary to COPD exacerbation. Also concerned there is a component of PNA, likely aspiration given history of minimal responsiveness at time of presentation and esophageal dysmotility.  - Continue supplemental O2 to keep sats > 88-92% - QHS and nocturnal BiPAP - Continue nebs and low dose prednisone - Add zosyn for aspiration in nosocomial setting. Plan 7 day course. - Agree palliative care consult  Joneen Roachaul Hoffman, AGACNP-BC [facility] Pager [phone] or [phone]  03/01/2016 4:46 PM   STAFF NOTE: Cindi CarbonI, Daniel Feinstein, MD FACP have personally reviewed patient's available data, including medical history, events of note, physical examination and test results as part of my evaluation. I have discussed with resident/NP and other care providers such as pharmacist, RN and RRT. In addition, I personally evaluated patient and elicited key findings of:  Awake, NO distress, ronchi left base, coarse mild throughout, she is well known to our service, all prior pcxr and CT reviewed and  compared to this admissions, appears she had change in neurostatus resulted from narcs /sedating meds and likely in setting gerd, dysphagia aspirated to LLL, she requires ABX, does not appear to need BIPAP daytime now, would ad abx, regardless of pct, and keep even to posisive balance, lactic reassuring, would not recommend mechainal support if declines, would not benefit from ett and would suffer, will follow in am    Mcarthur Rossetti. Tyson Alias, MD, FACP Pgr: [phone] [facility] 03/01/2016 8:15 PM

## 2016-03-01 NOTE — Care Management Note (Signed)
Case Management Note  Patient Details  Name: Nicole Maxwell MRN: [id number] Date of Birth: 1945-09-04  Subjective/Objective:   Patient is active with [facility], she lives alone, she has a private duty person with her at night per her daughter Nicole Maxwell.  She has home oxygen conts (2 liters) , she has nebulizer machine, shower chair and a bsc.  She can ride in car at dc per daughter but may need ambulance for oxygen.    NCM will cont to follow for dc needs.                  Action/Plan:   Expected Discharge Date:                  Expected Discharge Plan:  Home w Hospice Care  In-House Referral:     Discharge planning Services  CM Consult  Post Acute Care Choice:  Resumption of Svcs/PTA Provider Choice offered to:     DME Arranged:    DME Agency:     HH Arranged:  RN HH Agency:  [facility]  Status of Service:  In process, will continue to follow  If discussed at Long Length of Stay Meetings, dates discussed:    Additional Comments:  [name], Jacobus Colvin Clinton, RN 03/01/2016, 12:39 PM

## 2016-03-01 NOTE — Progress Notes (Signed)
[facility] TEAM 1 - Stepdown/ICU TEAM  Nicole Maxwell  ZOX:[id number] DOB: 1945/02/13 DOA: 02/28/2016 PCP: [name], MD    Brief Narrative:  71 y.o. female with a history of severe COPD on chronic nasal cannula oxygen as well as nocturnal BiPAP,  steroid-induced diabetes, hypertension, intermittent dysphagia related to esophageal dysmotility, physical deconditioning and protein calorie malnutrition, normocytic anemia, and dyslipidemia. Patient was seen by her PCP on 1/23 because of URI symptoms and prescribed Omnicef without any improvement.  On the day of admission EMS was called after the patient went to the bathroom and "couldn't get up".   Subjective: The patient has been confused this morning and has been combative.  She has made multiple attempts to get out of bed.  She has refused to comply with nursing wishes.  She is alert at the time of my visit but not able to provide a reliable history due to persisting confusion.  Her daughter is at the bedside and states that sundowning is a common problem for her mother during hospitalizations.  Assessment & Plan:  Acute on chronic respiratory failure with hypoxia and hypercapnia  ABG at presentation:  PH 7.24, pCO2 85.5, pO2 63 - appears to have improved w/ holding of sedating meds and short term use of BIPAP - care w/ meds - BIPAP each night as pt will allow per her home regimen   Severe chronic obstructive pulmonary disease w/ acute exacerbation  on 2 L oxygen at home and nocturnal BiPAP which she only tolerates for about 4 hours nightly - wean back to home O2 dose as able - cont maximal medical care for COPD - followed by Dr Marchelle Gearing as outpt and family requests that Pulm be consulted to see her in the hospital   Acute encephalopathy / acute delirium  Confusion worsening, now likely related to sundowning - this is interfering w/ her care as she refused BIPAP last night - begin trial of seroquel tonight - check B12, folic acid, and ABG in AM      Chronic LLL opacity - ?acute PNA chronic LLL opacity clarified on CT chest Feb 2017 consistent with small airway impaction with posterior left lower lobe mild collapse/consolidation versus atypical infection - CXR w/o signif change to my exam - cont empiric abx for 3 day course and follow unless PCCM feels longer course indicated   Acute RUQ abdom pain Reported fall at home but details scant - CT abdom w/o acute findings - most c/w superficial injury likely due to fall - no c/o abdom pain today though pt is confused    HTN Blood pressures slightly elevated coincident with agitation - follow without change in blood pressure meds today  Chronic diastolic congestive heart failure  Baseline wgt ~54kg - no evidence of acute exacerbation at this time   [facility] Weights   02/28/16 1651  Weight: 53.7 kg (118 lb 6.2 oz)    Esophageal dysmotility esophagram Nov 2017 documented 50% narrowing at C5-C6 level with additional findings consistent with esophageal spasm - cleared for regular diet per SLP this admit   DM 2 CBG currently controlled  Physical deconditioning   Normocytic anemia Baseline hemoglobin appears to be 9 - 10.2 - no evidence of gross blood loss - follow Hgb w/ downward trend   Goals of Care Patient is followed by home hospice but remains full code with exception to a DO NOT INTUBATE order - she appears to be in a state of overall decline and I'm not optimistic  about her overall chances of making a meaningful long-term recovery in the face of her severe lung disease - I have asked palliative care to assist in further clarifying the most appropriate goals of care for this patient   DVT prophylaxis: SCDs Code Status: DO NOT INTUBATE - LIMITED CODE Family Communication: Spoke with daughter at bedside  Disposition Plan: SDU  Consultants:  none  Procedures: none  Antimicrobials:  Azithromycin 2/3 Cefepime 2/3 > 2/5 Ceftriaxone 2/3 Vancomycin 2/3 >  2/4  Objective: Blood pressure (!) 141/88, pulse (!) 105, temperature 98.7 F (37.1 C), temperature source Oral, resp. rate 19, height 5\' 5"  (1.651 m), weight 53.7 kg (118 lb 6.2 oz), SpO2 99 %.  Intake/Output Summary (Last 24 hours) at 03/01/16 1403 Last data filed at 03/01/16 1309  Gross per 24 hour  Intake              450 ml  Output              250 ml  Net              200 ml   Filed Weights   02/28/16 1651  Weight: 53.7 kg (118 lb 6.2 oz)    Examination: General: no acute resp distress appreciated  Lungs: mild exp wheeze - poor air movement L base  Cardiovascular: Tachycardic but regular with no appreciable murmur  Abdomen: Tender superficially in right upper quadrant but no palpable mass or other abnormality - bowel sounds positive - non-distended - no rebound Extremities: No significant edema bilateral lower extremities  CBC:  Recent Labs Lab 02/28/16 0647 02/28/16 0653 02/29/16 0648 03/01/16 1232  WBC 24.6*  --  24.7* 21.1*  NEUTROABS 21.1*  --   --   --   HGB 8.3* 10.2* 7.6* 7.9*  HCT 30.4* 30.0* 26.3* 28.4*  MCV 84.0  --  81.2 80.9  PLT 274  --  223 269   Basic Metabolic Panel:  Recent Labs Lab 02/28/16 0647 02/28/16 0653 02/29/16 0648  NA 144 142 143  K 4.2 4.0 4.3  CL 101 98* 101  CO2 32  --  32  GLUCOSE 183* 176* 107*  BUN 24* 29* 16  CREATININE 0.96 1.00 0.72  CALCIUM 9.4  --  8.6*   GFR: Estimated Creatinine Clearance: 54.7 mL/min (by C-G formula based on SCr of 0.72 mg/dL).  Liver Function Tests:  Recent Labs Lab 02/28/16 0647 02/29/16 0648  AST 22 17  ALT 17 14  ALKPHOS 61 50  BILITOT 0.6 0.4  PROT 6.3* 5.4*  ALBUMIN 3.2* 2.5*    Recent Labs Lab 02/28/16 0647  LIPASE 16   Coagulation Profile:  Recent Labs Lab 02/28/16 0815  INR 0.97    HbA1C: Hgb A1c MFr Bld  Date/Time Value Ref Range Status  02/28/2016 08:32 AM 6.0 (H) 4.8 - 5.6 % Final    Comment:    (NOTE)         Pre-diabetes: 5.7 - 6.4          Diabetes: >6.4         Glycemic control for adults with diabetes: <7.0   10/17/2015 05:36 PM 6.4 (H) 4.8 - 5.6 % Final    Comment:    (NOTE)         Pre-diabetes: 5.7 - 6.4         Diabetes: >6.4         Glycemic control for adults with diabetes: <7.0     CBG:  Recent Labs Lab 02/29/16 1225 02/29/16 1806 02/29/16 2106 03/01/16 0820 03/01/16 1210  GLUCAP 232* 229* 134* 110* 102*    Recent Results (from the past 240 hour(s))  Culture, blood (routine x 2) Call MD if unable to obtain prior to antibiotics being given     Status: None (Preliminary result)   Collection Time: 02/28/16  8:20 AM  Result Value Ref Range Status   Specimen Description BLOOD RIGHT ANTECUBITAL  Final   Special Requests IN PEDIATRIC BOTTLE  4CC  Final   Culture NO GROWTH 1 DAY  Final   Report Status PENDING  Incomplete  Culture, blood (routine x 2) Call MD if unable to obtain prior to antibiotics being given     Status: None (Preliminary result)   Collection Time: 02/28/16  8:32 AM  Result Value Ref Range Status   Specimen Description BLOOD RIGHT HAND  Final   Special Requests IN PEDIATRIC BOTTLE  1CC  Final   Culture NO GROWTH 1 DAY  Final   Report Status PENDING  Incomplete  Urine culture     Status: Abnormal   Collection Time: 02/28/16 12:59 PM  Result Value Ref Range Status   Specimen Description URINE, RANDOM  Final   Special Requests NONE  Final   Culture MULTIPLE SPECIES PRESENT, SUGGEST RECOLLECTION (A)  Final   Report Status 02/29/2016 FINAL  Final  MRSA PCR Screening     Status: None   Collection Time: 02/28/16  5:34 PM  Result Value Ref Range Status   MRSA by PCR NEGATIVE NEGATIVE Final    Comment:        The GeneXpert MRSA Assay (FDA approved for NASAL specimens only), is one component of a comprehensive MRSA colonization surveillance program. It is not intended to diagnose MRSA infection nor to guide or monitor treatment for MRSA infections.   Culture, sputum-assessment      Status: None   Collection Time: 02/29/16  4:48 AM  Result Value Ref Range Status   Specimen Description SPUTUM  Final   Special Requests NONE  Final   Sputum evaluation   Final    Sputum specimen not acceptable for testing.  Please recollect.   Gram Stain Report Called to,Read Back By and Verified With: R. [facility] RN, AT [id number] 02/29/16 BY Renato Shin    Report Status 02/29/2016 FINAL  Final     Scheduled Meds: . ALPRAZolam  1 mg Oral QHS  . budesonide (PULMICORT) nebulizer solution  0.25 mg Nebulization BID  . enoxaparin (LOVENOX) injection  40 mg Subcutaneous Q24H  . feeding supplement (GLUCERNA SHAKE)  237 mL Oral TID BM  . fluticasone  1 spray Each Nare BID  . insulin aspart  0-5 Units Subcutaneous QHS  . insulin aspart  0-9 Units Subcutaneous TID WC  . ipratropium-albuterol  3 mL Nebulization QID  . latanoprost  1 drop Both Eyes QHS  . predniSONE  5 mg Oral Q breakfast  . sodium chloride flush  3 mL Intravenous Q12H     LOS: 2 days   Lonia Blood, MD [facility] Office  [phone] Pager - Text Page per Loretha Stapler as per below:  On-Call/Text Page:      [email or web address]      password [email or web address]  If 7PM-7AM, please contact night-coverage [email or web address] Password [email or web address] 03/01/2016, 2:03 PM

## 2016-03-01 NOTE — Progress Notes (Addendum)
Initial Nutrition Assessment  DOCUMENTATION CODES:   Not applicable  INTERVENTION:    Glucerna Shake po TID, each supplement provides 220 kcal and 10 grams of protein  NUTRITION DIAGNOSIS:   Inadequate oral intake related to lethargy/confusion as evidenced by meal completion < 50%.  GOAL:   Patient will meet greater than or equal to 90% of their needs  MONITOR:   PO intake, Supplement acceptance, I & O's  REASON FOR ASSESSMENT:   Consult Assessment of nutrition requirement/status  ASSESSMENT:   71 y.o. female with history of severe COPD on chronic nasal cannula oxygen as well as nocturnal BiPAP,  steroid-induced diabetes, hypertension, intermittent dysphagia related to esophageal dysmotility, physical deconditioning and protein calorie malnutrition, normocytic anemia, and dyslipidemia. On the day of admission EMS was called after the patient went to the bathroom and "couldn't get up".   Patient confused during RD visit. She was unable to provide any reliable nutrition hx. From review of usual weights, she has maintained a stable weight since November. Nutrition-Focused physical exam completed. Findings are no fat depletion, severe muscle depletion, and no edema.  PO intake of meals has been variable, consuming 5-90% of meals, average intake < 50%.  Diet Order:  Diet Carb Modified Fluid consistency: Thin; Room service appropriate? Yes  Skin:  Reviewed, no issues  Last BM:  2/5  Height:   Ht Readings from Last 1 Encounters:  02/28/16 5\' 5"  (1.651 m)    Weight:   Wt Readings from Last 1 Encounters:  02/28/16 118 lb 6.2 oz (53.7 kg)    Ideal Body Weight:  56.8 kg  BMI:  Body mass index is 19.7 kg/m.  Estimated Nutritional Needs:   Kcal:  1400-1600  Protein:  70-80 gm  Fluid:  1.4-1.6 L  EDUCATION NEEDS:   No education needs identified at this time  Joaquin CourtsKimberly Harris, RD, LDN, CNSC Pager [phone] After Hours Pager [phone]

## 2016-03-02 DIAGNOSIS — Z7189 Other specified counseling: Secondary | ICD-10-CM

## 2016-03-02 DIAGNOSIS — G9340 Encephalopathy, unspecified: Secondary | ICD-10-CM

## 2016-03-02 DIAGNOSIS — I5032 Chronic diastolic (congestive) heart failure: Secondary | ICD-10-CM

## 2016-03-02 DIAGNOSIS — J441 Chronic obstructive pulmonary disease with (acute) exacerbation: Secondary | ICD-10-CM

## 2016-03-02 DIAGNOSIS — K224 Dyskinesia of esophagus: Secondary | ICD-10-CM

## 2016-03-02 DIAGNOSIS — J690 Pneumonitis due to inhalation of food and vomit: Secondary | ICD-10-CM

## 2016-03-02 DIAGNOSIS — I10 Essential (primary) hypertension: Secondary | ICD-10-CM

## 2016-03-02 DIAGNOSIS — Z515 Encounter for palliative care: Secondary | ICD-10-CM

## 2016-03-02 LAB — GLUCOSE, CAPILLARY
GLUCOSE-CAPILLARY: 80 mg/dL (ref 65–99)
GLUCOSE-CAPILLARY: 96 mg/dL (ref 65–99)
GLUCOSE-CAPILLARY: 103 mg/dL — AB (ref 65–99)
Glucose-Capillary: 89 mg/dL (ref 65–99)

## 2016-03-02 LAB — BASIC METABOLIC PANEL
Anion gap: 10 (ref 5–15)
BUN: 11 mg/dL (ref 6–20)
CALCIUM: 9.2 mg/dL (ref 8.9–10.3)
CO2: 34 mmol/L — ABNORMAL HIGH (ref 22–32)
Chloride: 102 mmol/L (ref 101–111)
Creatinine, Ser: 0.64 mg/dL (ref 0.44–1.00)
GFR calc Af Amer: >60 mL/min (ref >60)
GLUCOSE: 86 mg/dL (ref 65–99)
POTASSIUM: 3.7 mmol/L (ref 3.5–5.1)
SODIUM: 146 mmol/L — AB (ref 135–145)

## 2016-03-02 LAB — CBC
HCT: 30.4 % — ABNORMAL LOW (ref 36.0–46.0)
Hemoglobin: 8.6 g/dL — ABNORMAL LOW (ref 12.0–15.0)
MCH: 23 pg — ABNORMAL LOW (ref 26.0–34.0)
MCHC: 28.3 g/dL — AB (ref 30.0–36.0)
MCV: 81.3 fL (ref 78.0–100.0)
PLATELETS: 235 10*3/uL (ref 150–400)
RBC: 3.74 MIL/uL — ABNORMAL LOW (ref 3.87–5.11)
RDW: 18 % — AB (ref 11.5–15.5)
WBC: 21.1 10*3/uL — AB (ref 4.0–10.5)

## 2016-03-02 LAB — BLOOD GAS, ARTERIAL
ACID-BASE EXCESS: 10.6 mmol/L — AB (ref 0.0–2.0)
BICARBONATE: 35.9 mmol/L — AB (ref 20.0–28.0)
DRAWN BY: 249101
O2 CONTENT: 1 L/min
O2 SAT: 88.3 %
PATIENT TEMPERATURE: 98.6
PO2 ART: 58.5 mmHg — AB (ref 83.0–108.0)
pCO2 arterial: 60.5 mmHg — ABNORMAL HIGH (ref 32.0–48.0)
pH, Arterial: 7.391 (ref 7.350–7.450)

## 2016-03-02 LAB — VITAMIN B12: VITAMIN B 12: 649 pg/mL (ref 180–914)

## 2016-03-02 LAB — FOLATE: FOLATE: 32.2 ng/mL (ref >5.9)

## 2016-03-02 MED ORDER — ORAL CARE MOUTH RINSE
15.0000 mL | Freq: Two times a day (BID) | OROMUCOSAL | Status: DC
  Administered 2016-03-04: 15 mL via OROMUCOSAL

## 2016-03-02 MED ORDER — DEXTROSE-NACL 5-0.9 % IV SOLN
INTRAVENOUS | Status: DC
  Administered 2016-03-02: 23:00:00 via INTRAVENOUS

## 2016-03-02 NOTE — Telephone Encounter (Signed)
Dr Marchelle Gearingamaswamy please see e-mail below from patient's daughter [name]. Thanks.   ----- Message -----    From: Rosana BergerAlta S. Engineer, productionBaker    Sent: 03/01/2016 11:37 PM EST      To: Kalman ShanAMASWAMY,MURALI, MD Subject: Visit Follow-Up Question  Mom was unable to make dr appointment Friday2/2/18 due to a bad fall. She was hospitalized sat 02/28/16 and they think its   Pneumonia in the left lobe however they are treating it like a infection. I would like you or someone from your team to [facility]   4 [facility] room 6 (step down unit) I asked the doctor to reach out to your group but I am at sure if he did . Thanks - [name] [name](Usha Grogan daughter) [phone]

## 2016-03-02 NOTE — Progress Notes (Signed)
PROGRESS NOTE    Nicole Maxwell  WUJ:[id number] DOB: 09/05/1945 DOA: 02/28/2016 PCP: [name], William, MD   Brief Narrative:  71 y.o. 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 Chronic back pain  Patient was seen by her PCP on 1/23 because of URI symptoms and prescribed Omnicef without any improvement.  On the day of admission EMS was called after the patient went to the bathroom and "couldn't get up".    Subjective: 2/6 A/O 1 (does not know where, why, when). Patient unable to follow commands.     Assessment & Plan:   Principal Problem:   Acute on chronic respiratory failure with hypoxia and hypercapnia (HCC) Active Problems:   Severe chronic obstructive pulmonary disease (HCC)   HTN (hypertension)   Hyperlipidemia   Chronic diastolic congestive heart failure (HCC)   HCAP (healthcare-associated pneumonia)   Esophageal dysmotility   DM type 2 (diabetes mellitus, type 2) (HCC)   Physical deconditioning   Normocytic anemia   Protein-calorie malnutrition, severe   Sepsis (HCC)   Acute LUQ pain   Acute on chronic respiratory failure with hypoxia and hypercapnia  ABG at presentation:  PH 7.24, pCO2 85.5, pO2 63 - appears to have improved w/ holding of sedating meds and short term use of BIPAP - care w/ meds - BIPAP each night as pt will allow per her home regimen   Severe chronic obstructive pulmonary disease w/ acute exacerbation  -on 2 L oxygen at home and nocturnal BiPAP which she only tolerates for about 4 hours nightly  - wean back to home O2 dose as able  - cont maximal medical care for COPD  - followed by Dr Marchelle Gearingamaswamy as outpt and family requests that Pulm be consulted to see her in the  hospital no  -[facility]5 Dr.Daniel Tyson AliasFeinstein if patient declines SHOULD NOT BE INTUBATED. Complete seven-day course antibiotics.  Acute encephalopathy / acute delirium  -Confusion worsening, now likely related to sundowning  - this is interfering w/ her care as she refused BIPAP last night -ABG not terribly abnormal. However patient's encephalopathy continues. - Seroquel  12.5 mg QHS  Chronic LLL opacity/Acute Aspiration PNA -chronic LLL opacity clarified on CT chest Feb 2017 consistent with small airway impaction with posterior left lower lobe mildcollapse/consolidation versus atypical infection  - [facility] - Continue supplemental O2 to keep sats > 88-92% - QHS and nocturnal BiPAP - Continue nebs and low dose prednisone  Acute RUQ abdom pain -Reported fall at home but details scant  - CT abdom w/o acute findings - most c/w superficial injury likely due to fall - no c/o abdom pain today though pt is confused    HTN Blood pressures slightly elevated coincident with agitation - follow without change in blood pressure meds today  Chronic diastolic congestive heart failure  -Baseline wgt ~54kg - no evidence of acute exacerbation at this time  -Strict in and out -Daily weight Filed Weights   02/28/16 1651 03/02/16 0500  Weight: 53.7 kg (118 lb 6.2 oz) 54.6 kg (120 lb 5.9 oz)   Esophageal dysmotility esophagram Nov 2017 documented 50% narrowing at C5-C6 level with additional findings consistent with esophageal spasm  -Although cleared for regular diet per SLP this admit patient's mental status inconsistent  with PO feeding. NPO until mental status improves -Start D5-0.9% saline 91ml/hr  DM Type  2 -CBG currently controlled  Physical deconditioning   Normocytic anemia -Baseline hemoglobin appears to be 9 - 10.2 - no evidence of gross blood loss  - follow Hgb w/ downward trend  Recent Labs Lab 02/28/16 0647 02/28/16 0653  02/29/16 0648 03/01/16 1232 03/02/16 0348  HGB 8.3* 10.2* 7.6* 7.9* 8.6*    Goals of Care -Patient is followed by home hospice but remains full code with exception to a DO NOT INTUBATE order - she appears to be in a state of overall decline and I'm not optimistic about her overall chances of making a meaningful long-term recovery in the face of her severe lung disease - I have asked palliative care to assist in further clarifying the most appropriate goals of care for this patient -PC CM; treat infection for 7 days if respiratory status declines DO NOT INTUBATE    DVT prophylaxis: SCD Code Status: DO NOT RESUSCITATE Family Communication: None Disposition Plan: Await palliative care meeting recommendation   Consultants:  Dr. Nelda Bucks PC CM    Procedures/Significant Events:  10/3 CT chest wo contrast: -Airspace opacities are noted in lingular segment of left lower lobe as well as left lower lobe most consistent with pneumonia with small associated left pleural effusion. -Mild emphysematous disease is noted in the upper lobes bilaterally. -Multilevel degenerative disc disease and postoperative changes are noted in the lower lumbar spine.     VENTILATOR SETTINGS:    Cultures   Antimicrobials: Anti-infectives    Start     Stop   03/01/16 1800  piperacillin-tazobactam (ZOSYN) IVPB 3.375 g         02/29/16 1000  vancomycin (VANCOCIN) IVPB 750 mg/150 ml premix  Status:  Discontinued     02/29/16 1126   02/28/16 0830  ceFEPIme (MAXIPIME) 1 g in dextrose 5 % 50 mL IVPB  Status:  Discontinued     03/01/16 1359   02/28/16 0830  vancomycin (VANCOCIN) IVPB 1000 mg/200 mL premix     02/28/16 1122   02/28/16 0715  cefTRIAXone (ROCEPHIN) 1 g in dextrose 5 % 50 mL IVPB     02/28/16 0754   02/28/16 0715  azithromycin (ZITHROMAX) 500 mg in dextrose 5 % 250 mL IVPB     02/28/16 0916       Devices    LINES / TUBES:      Continuous  Infusions:   Objective: Vitals:   03/01/16 2313 03/02/16 0330 03/02/16 0500 03/02/16 0804  BP: (!) 163/91 (!) 178/86    Pulse: 99 (!) 101    Resp: (!) 25 (!) 28    Temp: 97.8 F (36.6 C) 99 F (37.2 C)    TempSrc: Oral Oral    SpO2: 100% 98%  100%  Weight:   54.6 kg (120 lb 5.9 oz)   Height:        Intake/Output Summary (Last 24 hours) at 03/02/16 0828 Last data filed at 03/02/16 0600  Gross per 24 hour  Intake              220 ml  Output             1150 ml  Net             -930 ml   Filed Weights   02/28/16 1651 03/02/16 0500  Weight: 53.7 kg (118 lb 6.2 oz) 54.6 kg (120 lb 5.9 oz)  Examination:  General:  A/O 1 (does not know where, why, when). Patient unable to follow commands, positive acute on chronic  respiratory distress Eyes: negative scleral hemorrhage, negative anisocoria, negative icterus ENT: Negative Runny nose, negative gingival bleeding, Neck:  Negative scars, masses, torticollis, lymphadenopathy, JVD Lungs: diffuse rhonchi, negative crackles, negative wheezing, poor air movement especially RLL/RML Cardiovascular: Tachycardic, Regular  rhythm without murmur gallop or rub normal S1 and S2 Abdomen: negative abdominal pain, nondistended, positive soft, bowel sounds, no rebound, no ascites, no appreciable mass Extremities: No significant cyanosis, clubbing, or edema bilateral lower extremities Skin: Negative rashes, lesions, ulcers Psychiatric:  Unable to assess  Central nervous system:  Patient on extremities spontaneously. Unable to complete assessment.  .     Data Reviewed: Care during the described time interval was provided by me .  I have reviewed this patient's available data, including medical history, events of note, physical examination, and all test results as part of my evaluation. I have personally reviewed and interpreted all radiology studies.  CBC:  Recent Labs Lab 02/28/16 0647 02/28/16 0653 02/29/16 0648 03/01/16 1232  03/02/16 0348  WBC 24.6*  --  24.7* 21.1* 21.1*  NEUTROABS 21.1*  --   --   --   --   HGB 8.3* 10.2* 7.6* 7.9* 8.6*  HCT 30.4* 30.0* 26.3* 28.4* 30.4*  MCV 84.0  --  81.2 80.9 81.3  PLT 274  --  223 269 235   Basic Metabolic Panel:  Recent Labs Lab 02/28/16 0647 02/28/16 0653 02/29/16 0648 03/02/16 0348  NA 144 142 143 146*  K 4.2 4.0 4.3 3.7  CL 101 98* 101 102  CO2 32  --  32 34*  GLUCOSE 183* 176* 107* 86  BUN 24* 29* 16 11  CREATININE 0.96 1.00 0.72 0.64  CALCIUM 9.4  --  8.6* 9.2   GFR: Estimated Creatinine Clearance: 55.6 mL/min (by C-G formula based on SCr of 0.64 mg/dL). Liver Function Tests:  Recent Labs Lab 02/28/16 0647 02/29/16 0648  AST 22 17  ALT 17 14  ALKPHOS 61 50  BILITOT 0.6 0.4  PROT 6.3* 5.4*  ALBUMIN 3.2* 2.5*    Recent Labs Lab 02/28/16 0647  LIPASE 16   No results for input(s): AMMONIA in the last 168 hours. Coagulation Profile:  Recent Labs Lab 02/28/16 0815  INR 0.97   Cardiac Enzymes: No results for input(s): CKTOTAL, CKMB, CKMBINDEX, TROPONINI in the last 168 hours. BNP (last 3 results) No results for input(s): PROBNP in the last 8760 hours. HbA1C:  Recent Labs  02/28/16 0832  HGBA1C 6.0*   CBG:  Recent Labs Lab 02/29/16 2106 03/01/16 0820 03/01/16 1210 03/01/16 1833 03/01/16 2101  GLUCAP 134* 110* 102* 101* 92   Lipid Profile: No results for input(s): CHOL, HDL, LDLCALC, TRIG, CHOLHDL, LDLDIRECT in the last 72 hours. Thyroid Function Tests: No results for input(s): TSH, T4TOTAL, FREET4, T3FREE, THYROIDAB in the last 72 hours. Anemia Panel:  Recent Labs  03/02/16 0348  VITAMINB12 649  FOLATE 32.2   Urine analysis:    Component Value Date/Time   COLORURINE YELLOW 02/28/2016 1116   APPEARANCEUR HAZY (A) 02/28/2016 1116   LABSPEC 1.020 02/28/2016 1116   PHURINE 5.0 02/28/2016 1116   GLUCOSEU NEGATIVE 02/28/2016 1116   HGBUR NEGATIVE 02/28/2016 1116   BILIRUBINUR NEGATIVE 02/28/2016 1116    KETONESUR NEGATIVE 02/28/2016 1116   PROTEINUR 30 (A) 02/28/2016 1116   NITRITE NEGATIVE 02/28/2016 1116   LEUKOCYTESUR NEGATIVE 02/28/2016 1116   Sepsis  Labs: @LABRCNTIP (procalcitonin:4,lacticidven:4)  ) Recent Results (from the past 240 hour(s))  Culture, blood (routine x 2) Call MD if unable to obtain prior to antibiotics being given     Status: None (Preliminary result)   Collection Time: 02/28/16  8:20 AM  Result Value Ref Range Status   Specimen Description BLOOD RIGHT ANTECUBITAL  Final   Special Requests IN PEDIATRIC BOTTLE  4CC  Final   Culture NO GROWTH 2 DAYS  Final   Report Status PENDING  Incomplete  Culture, blood (routine x 2) Call MD if unable to obtain prior to antibiotics being given     Status: None (Preliminary result)   Collection Time: 02/28/16  8:32 AM  Result Value Ref Range Status   Specimen Description BLOOD RIGHT HAND  Final   Special Requests IN PEDIATRIC BOTTLE  1CC  Final   Culture NO GROWTH 2 DAYS  Final   Report Status PENDING  Incomplete  Urine culture     Status: Abnormal   Collection Time: 02/28/16 12:59 PM  Result Value Ref Range Status   Specimen Description URINE, RANDOM  Final   Special Requests NONE  Final   Culture MULTIPLE SPECIES PRESENT, SUGGEST RECOLLECTION (A)  Final   Report Status 02/29/2016 FINAL  Final  MRSA PCR Screening     Status: None   Collection Time: 02/28/16  5:34 PM  Result Value Ref Range Status   MRSA by PCR NEGATIVE NEGATIVE Final    Comment:        The GeneXpert MRSA Assay (FDA approved for NASAL specimens only), is one component of a comprehensive MRSA colonization surveillance program. It is not intended to diagnose MRSA infection nor to guide or monitor treatment for MRSA infections.   Culture, sputum-assessment     Status: None   Collection Time: 02/29/16  4:48 AM  Result Value Ref Range Status   Specimen Description SPUTUM  Final   Special Requests NONE  Final   Sputum evaluation   Final    Sputum  specimen not acceptable for testing.  Please recollect.   Gram Stain Report Called to,Read Back By and Verified With: R. [facility] RN, AT 1610 02/29/16 BY Renato Shin    Report Status 02/29/2016 FINAL  Final         Radiology Studies: No results found.      Scheduled Meds: . ALPRAZolam  1 mg Oral QHS  . budesonide (PULMICORT) nebulizer solution  0.25 mg Nebulization BID  . enoxaparin (LOVENOX) injection  40 mg Subcutaneous Q24H  . feeding supplement (GLUCERNA SHAKE)  237 mL Oral TID BM  . fluticasone  1 spray Each Nare BID  . insulin aspart  0-5 Units Subcutaneous QHS  . insulin aspart  0-9 Units Subcutaneous TID WC  . ipratropium-albuterol  3 mL Nebulization QID  . latanoprost  1 drop Both Eyes QHS  . piperacillin-tazobactam (ZOSYN)  IV  3.375 g Intravenous Q8H  . predniSONE  5 mg Oral Q breakfast  . QUEtiapine  12.5 mg Oral QHS  . sodium chloride flush  3 mL Intravenous Q12H   Continuous Infusions:   LOS: 3 days    Time spent: 40 minutes    Somer Trotter, Roselind Messier, MD [facility] Pager [phone]   If 7PM-7AM, please contact night-coverage [email or web address] Password [email or web address] 03/02/2016, 8:28 AM

## 2016-03-02 NOTE — Consult Note (Signed)
Consultation Note Date: 03/02/2016   Patient Name: Nicole Maxwell  DOB: 1946/01/02  MRN: [id number]  Age / Sex: 71 y.o., female  PCP: [name], MD Referring Physician: Drema Dallas, MD  Reason for Consultation: Establishing goals of care, Non pain symptom management and Psychosocial/spiritual support  HPI/Patient Profile: 71 y.o. female   admitted on 02/28/2016  a PMH for  significant for chronic respiratory failure with hypoxia, COPD, chronic steroids, high blood pressure, dysphagia, asthma who presents with acute encephalopathy and respiratory distress. She is a pateint of [facility].  Admitted for  acute on chronic respiratory failure with hypoxia and hypercarbia. Per family she has been declining slowly both physically and functionally over the past several months.  Family face advanced directive decisions and anticipatory care needs  Clinical Assessment and Goals of Care:  This NP Lorinda Creed reviewed medical records, received report from team, assessed the patient and then meet at the patient's bedside along with her daughter Byrd Hesselbach and [facility] chaplain/Kimberly  to discuss diagnosis, prognosis, GOC, EOL wishes disposition and options.  A  discussion was had today regarding advanced directives.  Concepts specific to code status, artifical feeding and hydration, continued IV antibiotics and rehospitalization was had.  The difference between a aggressive medical intervention path  and a palliative comfort care path for this patient at this time was had.  Values and goals of care important to patient and family were attempted to be elicited.  MOST form revisited.  Patient herself filled out a MOST form about a year ago requesting full scope of treatment. Her daughter struggles with altering from this, I encouraged her to consider the importance of revisiting and making sound, informative decisions  dependant on the physical and functional and cognitive condition of her mother.  Concept of Hospice and Palliative Care were discussed  Natural trajectory and expectations at EOL were discussed.  Questions and concerns addressed.   Family encouraged to call with questions or concerns.  PMT will continue to support holistically.  NEXT OF KIN/daughter Byrd Hesselbach    SUMMARY OF RECOMMENDATIONS    Code Status/Advance Care Planning:  Limited code-recommended DNR/DNI status understanding poor outcomes in similar patients    Palliative Prophylaxis:   Aspiration, Bowel Regimen, Delirium Protocol, Frequent Pain Assessment and Oral Care  Additional Recommendations (Limitations, Scope, Preferences):  Daughter is hopeful that her mother will be stable enough to transition to a SNF for rehabilitation on discharge  Psycho-social/Spiritual:   Desire for further Chaplaincy support:yes  Additional Recommendations: Education on Hospice and Grief/Bereavement Support  Prognosis:   < 6 months  Discharge Planning: Skilled Nursing Facility for rehab with Palliative care service follow-up      Primary Diagnoses: Present on Admission: . (Resolved) Acute respiratory failure with hypoxia (HCC) . Severe chronic obstructive pulmonary disease (HCC) . (Resolved) Acute on chronic respiratory failure with hypoxia (HCC) . Sepsis (HCC) . Protein-calorie malnutrition, severe . Normocytic anemia . HCAP (healthcare-associated pneumonia) . Physical deconditioning . Hyperlipidemia . HTN (hypertension) . Chronic diastolic congestive heart  failure (HCC) . Esophageal dysmotility . Acute on chronic respiratory failure with hypoxia and hypercapnia (HCC) . Acute LUQ pain   I have reviewed the medical record, interviewed the patient and family, and examined the patient. The following aspects are pertinent.  Past Medical History:  Diagnosis Date  . Alcohol abuse, in remission quit 2004  . Anemia of chronic  disease   . Anxiety    Xanax prn  . Arthritis    "my whole body"  . Asthma   . Chronic back pain    "goes from the lower part of my back on down my legs"  (04/17/2014)  . Chronic respiratory failure with hypercapnia (HCC)   . COPD (chronic obstructive pulmonary disease) (HCC)    cxr 04/03/2010 - hyperinflation  . COPD exacerbation (HCC)   . Dysphagia   . Esophageal dysmotility   . HCAP (healthcare-associated pneumonia)   . Hyperlipidemia   . Hypertension   . Leukocytosis   . Neuromuscular disorder (HCC)    lumbar disc  . On home oxygen therapy    "2L; suppose to be 24/7" (04/17/2014)  . Osteopenia   . Physical deconditioning   . Pneumonia    "3 times in the last month or so" (04/17/2014)  . Protein calorie malnutrition (HCC)   . PVD (peripheral vascular disease) (HCC)   . Shortness of breath    with anxiety  and activty  . Tobacco abuse    " I am addicted"   . Type II diabetes mellitus (HCC)   . Vitamin D deficiency disease    03/26/2010 - 9.9ng/mL. Started on replacemnt   Social History   Social History  . Marital status: Widowed    Spouse name: N/A  . Number of children: 1  . Years of education: N/A   Occupational History  . retired from [employer] Retired   Social History Main Topics  . Smoking status: Current Some Day Smoker    Packs/day: 0.25    Years: 56.00    Types: Cigarettes  . Smokeless tobacco: Never Used     Comment: 5 cigs per week/10.9.17  . Alcohol use 0.0 oz/week     Comment: h/o heavy ETOH quit 2004  . Drug use: No  . Sexual activity: No   Other Topics Concern  . None   Social History Narrative  . None   Family History  Problem Relation Age of Onset  . Heart attack Sister [id number]    CABG  . Breast cancer Sister   . Cancer Brother   . Heart attack Brother   . Hypertension Other     all siblings  . Anesthesia problems Neg Hx    Scheduled Meds: . ALPRAZolam  1 mg Oral QHS  . budesonide (PULMICORT) nebulizer solution  0.25 mg  Nebulization BID  . enoxaparin (LOVENOX) injection  40 mg Subcutaneous Q24H  . feeding supplement (GLUCERNA SHAKE)  237 mL Oral TID BM  . fluticasone  1 spray Each Nare BID  . insulin aspart  0-5 Units Subcutaneous QHS  . insulin aspart  0-9 Units Subcutaneous TID WC  . ipratropium-albuterol  3 mL Nebulization QID  . latanoprost  1 drop Both Eyes QHS  . piperacillin-tazobactam (ZOSYN)  IV  3.375 g Intravenous Q8H  . predniSONE  5 mg Oral Q breakfast  . QUEtiapine  12.5 mg Oral QHS  . sodium chloride flush  3 mL Intravenous Q12H   Continuous Infusions: PRN Meds:.acetaminophen **OR** acetaminophen, albuterol, ALPRAZolam, HYDROcodone-acetaminophen, hydroxypropyl methylcellulose /  hypromellose, ondansetron **OR** ondansetron (ZOFRAN) IV Medications Prior to Admission:  Prior to Admission medications   Medication Sig Start Date End Date Taking? Authorizing Provider  acetaminophen (TYLENOL) 500 MG tablet Take 500 mg by mouth every 6 (six) hours as needed for mild pain.   Yes Historical Provider, MD  albuterol (PROAIR HFA) 108 (90 Base) MCG/ACT inhaler Inhale 2 puffs into the lungs every 6 (six) hours as needed.    Yes Historical Provider, MD  albuterol (PROVENTIL) (2.5 MG/3ML) 0.083% nebulizer solution Take 3 mLs (2.5 mg total) by nebulization every 4 (four) hours as needed for wheezing or shortness of breath. (may take 1 every 4 hours as needed for wheezing/shortness of breath) DX: 496 01/09/14  Yes [name], MD  alprazolam Prudy Feeler) 2 MG tablet Take 1/2 tablet by mouth once daily; Take 1 tablet by mouth every night at bedtime Patient taking differently: Take 1-2 mg by mouth See admin instructions. 1mg  in am, 2mg  in pm 12/01/15  Yes Tiffany L Reed, DO  aspirin EC 81 MG EC tablet Take 1 tablet (81 mg total) by mouth daily. 01/09/14  Yes [name], MD  atorvastatin (LIPITOR) 20 MG tablet Take 20 mg by mouth daily.  09/14/14  Yes Historical Provider, MD  budesonide-formoterol (SYMBICORT) 160-4.5  MCG/ACT inhaler Inhale 2 puffs into the lungs 2 (two) times daily.   Yes Historical Provider, MD  cefdinir (OMNICEF) 300 MG capsule Take 300 mg by mouth 2 (two) times daily.   Yes Historical Provider, MD  Dextromethorphan-Guaifenesin (MUCINEX DM MAXIMUM STRENGTH) 60-1200 MG TB12 Take 1 tablet by mouth every 12 (twelve) hours.   Yes Historical Provider, MD  DULoxetine (CYMBALTA) 60 MG capsule Take 60 mg by mouth daily. Reported on 08/01/2015   Yes Historical Provider, MD  feeding supplement, GLUCERNA SHAKE, (GLUCERNA SHAKE) LIQD Take 237 mLs by mouth daily.   Yes Historical Provider, MD  fluticasone (FLONASE) 50 MCG/ACT nasal spray Place 1 spray into both nostrils 2 (two) times daily. 12/23/14  Yes Sharee Holster, NP  furosemide (LASIX) 20 MG tablet Take 20 mg by mouth daily.    Yes Historical Provider, MD  HYDROcodone-acetaminophen (NORCO/VICODIN) 5-325 MG tablet Take 1 tablet by mouth every 6 (six) hours as needed for moderate pain. Do not exceed 4gm of Tylenol in 24 hours Patient taking differently: Take 2 tablets by mouth every 6 (six) hours as needed for moderate pain. Do not exceed 4gm of Tylenol in 24 hours 12/01/15  Yes Tiffany L Reed, DO  hydroxypropyl methylcellulose / hypromellose (ISOPTO TEARS / GONIOVISC) 2.5 % ophthalmic solution Place 1 drop into both eyes 3 (three) times daily as needed for dry eyes.   Yes Historical Provider, MD  ipratropium-albuterol (DUONEB) 0.5-2.5 (3) MG/3ML SOLN INHALE 1 VIAL VIA NEBULIZER FOUR TIMES A DAY 01/08/16  Yes Kalman Shan, MD  latanoprost (XALATAN) 0.005 % ophthalmic solution Place 1 drop into both eyes at bedtime. 10/15/15  Yes Historical Provider, MD  losartan (COZAAR) 25 MG tablet Take 25 mg by mouth daily. 10/01/15  Yes Historical Provider, MD  metFORMIN (GLUCOPHAGE) 500 MG tablet TAKE 1 TABLET BY MOUTH TWICE DAILY 01/07/15  Yes Sharee Holster, NP  Multiple Vitamins-Minerals (MULTIVITAMIN WITH MINERALS) tablet Take 1 tablet by mouth daily.   Yes  Historical Provider, MD  OXYGEN Inhale 2 L into the lungs continuous.   Yes Historical Provider, MD  pantoprazole (PROTONIX) 40 MG tablet TAKE 1 TABLET BY MOUTH EVERY NIGHT 01/07/15  Yes Sharee Holster, NP  polyethylene glycol (MIRALAX / GLYCOLAX) packet Take 17 g by mouth daily as needed for mild constipation. 10/24/15  Yes Jeralyn Bennett, MD  potassium chloride SA (K-DUR,KLOR-CON) 20 MEQ tablet Take 1 tablet (20 mEq total) by mouth daily. Take while on lasix 12/12/14  Yes Jeanella Craze, NP  predniSONE (DELTASONE) 5 MG tablet Take 5 mg by mouth daily with breakfast.   Yes Historical Provider, MD  PRESCRIPTION MEDICATION Inhale into the lungs continuous. CPAP   Yes Historical Provider, MD  amLODipine (NORVASC) 10 MG tablet Take 1 tablet (10 mg total) by mouth daily. Patient not taking: Reported on 02/28/2016 10/25/15   Jeralyn Bennett, MD   Allergies  Allergen Reactions  . Brovana [Arformoterol Tartrate] Shortness Of Breath and Cough    General intolerance  . Budesonide Shortness Of Breath and Cough    General intolerance  . Mucomyst [Acetylcysteine] Shortness Of Breath   Review of Systems  Unable to perform ROS: Mental status change    Physical Exam  Constitutional: She appears well-developed. She appears ill.  Cardiovascular: Normal rate, regular rhythm and normal heart sounds.   Pulmonary/Chest: She has decreased breath sounds in the right lower field and the left lower field.  Abdominal: Soft. Bowel sounds are normal.  Musculoskeletal:  -generalized weakness  Skin: Skin is warm and dry.  Psychiatric: She is agitated. Cognition and memory are impaired.    Vital Signs: BP (!) 165/68 (BP Location: Right Arm)   Pulse 93   Temp 99 F (37.2 C) (Oral)   Resp (!) 25   Ht 5\' 5"  (1.651 m)   Wt 54.6 kg (120 lb 5.9 oz)   SpO2 98%   BMI 20.03 kg/m  Pain Assessment: PAINAD   Pain Score: 0-No pain   SpO2: SpO2: 98 % O2 Device:SpO2: 98 % O2 Flow Rate: .O2 Flow Rate (L/min): 2  L/min  IO: Intake/output summary:  Intake/Output Summary (Last 24 hours) at 03/02/16 0951 Last data filed at 03/02/16 0945  Gross per 24 hour  Intake              160 ml  Output             1250 ml  Net            -1090 ml    LBM: Last BM Date: 03/01/16 Baseline Weight: Weight: 53.7 kg (118 lb 6.2 oz) Most recent weight: Weight: 54.6 kg (120 lb 5.9 oz)      Palliative Assessment/Data:  30 % at best    Discussed with Dr Joseph Art Time In: 1230 Time Out: 1345 Time Total: 75 min Greater than 50%  of this time was spent counseling and coordinating care related to the above assessment and plan.  Signed by: Lorinda Creed, NP   Please contact Palliative Medicine Team phone at [phone] for questions and concerns.  For individual provider: See Loretha Stapler

## 2016-03-02 NOTE — Telephone Encounter (Signed)
Patient already seen by Dr Tyson AliasFeinstein yesterday  Dr. Kalman ShanMurali Samoria Fedorko, M.D., [facility] Medicine Staff Physician [facility] Pager: [phone], If no answer or between  15:00h - 7:00h: call [phone]  03/02/2016 2:20 PM

## 2016-03-02 NOTE — Progress Notes (Signed)
Pt restless, multiple attempts to get out of bed and pull at lines.  NP Craige CottaKirby notified and restraints ordered for safety. Will continue to monitor.

## 2016-03-02 NOTE — Care Management Note (Addendum)
Case Management Note  Patient Details  Name: Nicole Maxwell MRN: [id number] Date of Birth: 07/22/1945  Subjective/Objective:    [facility], she spoke with daughter and she would like for her mother to get rehab with palliative (snf),  She was informed that she would have to give up her hospice and she states she was ok with that [facility].  Await pt/ot eval.    2/7 1432 [name] RN, BSN - NCM spoke with [name], patient's daughgter, she states she would like for mother to go to a SNF with palliative following, NCM made referral to CSW.               Action/Plan:   Expected Discharge Date:                  Expected Discharge Plan:  Skilled Nursing Facility  In-House Referral:  Clinical Social Work  Discharge planning Services  CM Consult  Post Acute Care Choice:  Resumption of Svcs/PTA Provider Choice offered to:     DME Arranged:    DME Agency:     HH Arranged:    HH Agency:  [facility]  Status of Service:  In process, will continue to follow  If discussed at Long Length of Stay Meetings, dates discussed:    Additional Comments:  [name], [name], RN 03/02/2016, 4:28 PM

## 2016-03-02 NOTE — Progress Notes (Signed)
[facility] 4E06 - [facility]: RN GIP Visit at 1020am.   This is a related, covered GIP admission from 02/28/16 per MD Patric DykesJohn Feldmann. Admit to [facility] with COPD. [facility] with presentation of acute respiratory failure and altered mentation after EMS called to home because patient "could not get up". Code status is DNI at this time. Patient was seen by her PCP on 1/23 because of URI symptoms (cough and fever)and was prescribed Omnicef 300 mg twice a day without any improvement in her symptoms. Of note,the patient was scheduled to see herpulmonologist on 2/2 but because of recent falls and generalized weakness she was unable to attend that appointment.  Visited patient in room with no visitors at bedside.  Patient has tele-sitter in place for safety concerns.  Patient was still combatant overnight.  4-point restraints placed on patient and she still has mittens in place. Patient sleeping quietly with NAD noted at this time.   Spoke with Seward GraterMaggie, RN, who advised that patient was made NPO this morning because of lethargy - due to being started on Seroquel last night.  Patient on QUEtiapine  (SEROQUEL) tablet 12.5mg  PO, daily at bedtime.  Also received Alprazolam (xanax) tablet 1 mg PO at bedtime.    [facility] will continue to see patient daily while in hospital.  Thank you,  Adele BarthelAmy Evans, RN, BSN [facility] Hospital Liaison [phone]  All hospice liaison's are now on AMION.   Please feel free to call me at the above number or call hospice at [phone] after 5pm.

## 2016-03-03 ENCOUNTER — Encounter: Payer: Self-pay | Admitting: Registered Nurse

## 2016-03-03 LAB — CBC
HEMATOCRIT: 29.7 % — AB (ref 36.0–46.0)
HEMOGLOBIN: 8.2 g/dL — AB (ref 12.0–15.0)
MCH: 22.2 pg — ABNORMAL LOW (ref 26.0–34.0)
MCHC: 27.6 g/dL — ABNORMAL LOW (ref 30.0–36.0)
MCV: 80.5 fL (ref 78.0–100.0)
Platelets: 302 10*3/uL (ref 150–400)
RBC: 3.69 MIL/uL — ABNORMAL LOW (ref 3.87–5.11)
RDW: 17.7 % — AB (ref 11.5–15.5)
WBC: 16.4 10*3/uL — AB (ref 4.0–10.5)

## 2016-03-03 LAB — GLUCOSE, CAPILLARY
GLUCOSE-CAPILLARY: 88 mg/dL (ref 65–99)
GLUCOSE-CAPILLARY: 96 mg/dL (ref 65–99)
GLUCOSE-CAPILLARY: 235 mg/dL — AB (ref 65–99)
GLUCOSE-CAPILLARY: 124 mg/dL — AB (ref 65–99)
GLUCOSE-CAPILLARY: 185 mg/dL — AB (ref 65–99)
Glucose-Capillary: 180 mg/dL — ABNORMAL HIGH (ref 65–99)
Glucose-Capillary: 160 mg/dL — ABNORMAL HIGH (ref 65–99)

## 2016-03-03 LAB — BASIC METABOLIC PANEL
Anion gap: 10 (ref 5–15)
BUN: 13 mg/dL (ref 6–20)
CHLORIDE: 101 mmol/L (ref 101–111)
CO2: 35 mmol/L — ABNORMAL HIGH (ref 22–32)
Calcium: 8.9 mg/dL (ref 8.9–10.3)
Creatinine, Ser: 0.76 mg/dL (ref 0.44–1.00)
GFR calc non Af Amer: >60 mL/min (ref >60)
Glucose, Bld: 146 mg/dL — ABNORMAL HIGH (ref 65–99)
POTASSIUM: 3.8 mmol/L (ref 3.5–5.1)
Sodium: 146 mmol/L — ABNORMAL HIGH (ref 135–145)

## 2016-03-03 LAB — MAGNESIUM: MAGNESIUM: 1.9 mg/dL (ref 1.7–2.4)

## 2016-03-03 MED ORDER — BENZONATATE 100 MG PO CAPS
200.0000 mg | ORAL_CAPSULE | Freq: Three times a day (TID) | ORAL | Status: DC | PRN
  Administered 2016-03-03: 200 mg via ORAL
  Filled 2016-03-03: qty 2

## 2016-03-03 MED ORDER — GUAIFENESIN 100 MG/5ML PO SOLN
5.0000 mL | ORAL | Status: DC | PRN
  Administered 2016-03-03: 100 mg via ORAL
  Filled 2016-03-03: qty 5

## 2016-03-03 NOTE — Care Management Important Message (Signed)
Important Message  Patient Details  Name: Earlyne Ibalta S Gortney MRN: [id number] Date of Birth: 1945-08-26   Medicare Important Message Given:  Yes    [name], Henna Derderian Abena 03/03/2016, 11:41 AM

## 2016-03-03 NOTE — Evaluation (Signed)
Occupational Therapy Evaluation Patient Details Name: Nicole Maxwell MRN: [id number] DOB: May 02, 1945 Today's Date: 03/03/2016    History of Present Illness Pt admitted after falling at home with respiratory failure with hypoxia and encephalopathy. PMH: end stage COPD with hospice care, dysphagia, chronic steroid use.    Clinical Impression   Pt was living alone, ambulating with a rollator and completing ADL and meal prep. She has had a decline in functional status over the last several months. Pt presents with impaired cognition, decreased activity tolerance, generalized weakness and impaired balance. She is a high fall risk. Pt is unsafe to return home alone. Recommending SNF upon discharge. Will follow.    Follow Up Recommendations  SNF;Supervision/Assistance - 24 hour    Equipment Recommendations       Recommendations for Other Services       Precautions / Restrictions Precautions Precautions: Fall Precaution Comments: watch sats Restrictions Weight Bearing Restrictions: No      Mobility Bed Mobility Overal bed mobility: Needs Assistance Bed Mobility: Supine to Sit;Sit to Supine     Supine to sit: Supervision Sit to supine: Supervision   General bed mobility comments: supervision for safety  Transfers Overall transfer level: Needs assistance Equipment used: Rolling walker (2 wheeled) Transfers: Sit to/from Stand Sit to Stand: Min assist         General transfer comment: steadying assist, increased time    Balance Overall balance assessment: Needs assistance   Sitting balance-Leahy Scale: Fair       Standing balance-Leahy Scale: Poor Standing balance comment: reliant on B UE support with ambulation                            ADL Overall ADL's : Needs assistance/impaired Eating/Feeding: Set up;Bed level   Grooming: Supervision/safety;Sitting   Upper Body Bathing: Sitting;Minimal assistance   Lower Body Bathing: Sit to/from  stand;Moderate assistance   Upper Body Dressing : Minimal assistance;Sitting   Lower Body Dressing: Minimal assistance;Sit to/from stand   Toilet Transfer: Minimal assistance;Moderate assistance;Ambulation;RW   Toileting- Clothing Manipulation and Hygiene: Minimal assistance;Sit to/from stand       Functional mobility during ADLs: Minimal assistance;Moderate assistance;Rolling walker General ADL Comments: Pt with poor activity tolerance interfering with ability to perform LB ADL in sitting.     Vision     Perception     Praxis      Pertinent Vitals/Pain Pain Assessment: Faces Faces Pain Scale: Hurts little more Pain Location: chest Pain Descriptors / Indicators: Sore Pain Intervention(s): Repositioned;Monitored during session     Hand Dominance Right   Extremity/Trunk Assessment Upper Extremity Assessment Upper Extremity Assessment: Generalized weakness   Lower Extremity Assessment Lower Extremity Assessment: Defer to PT evaluation       Communication Communication Communication: No difficulties   Cognition Arousal/Alertness: Awake/alert Behavior During Therapy: WFL for tasks assessed/performed Overall Cognitive Status: Impaired/Different from baseline Area of Impairment: Orientation;Memory;Safety/judgement Orientation Level: Disoriented to;Time;Situation   Memory: Decreased short-term memory   Safety/Judgement: Decreased awareness of safety;Decreased awareness of deficits     General Comments: pt aware that she is unsafe to go home alone   General Comments       Exercises       Shoulder Instructions      Home Living Family/patient expects to be discharged to:: Private residence Living Arrangements: Alone Available Help at Discharge: Family;Available PRN/intermittently  Additional Comments: daughter requesting SNF upon discharge      Prior Functioning/Environment Level of Independence: Needs assistance   Gait / Transfers Assistance Needed: walking with a rollator ADL's / Homemaking Assistance Needed: reports being able to make meals and care for herself, sister brought in meals            OT Problem List: Decreased activity tolerance;Impaired balance (sitting and/or standing);Decreased cognition;Decreased safety awareness;Decreased knowledge of use of DME or AE;Cardiopulmonary status limiting activity;Pain;Decreased strength   OT Treatment/Interventions: Self-care/ADL training;Energy conservation;DME and/or AE instruction;Therapeutic activities;Cognitive remediation/compensation;Balance training;Patient/family education    OT Goals(Current goals can be found in the care plan section) Acute Rehab OT Goals Patient Stated Goal: to get better OT Goal Formulation: With patient Time For Goal Achievement: 03/17/16 Potential to Achieve Goals: Fair ADL Goals Pt Will Perform Grooming: with supervision;standing (2 activities) Pt Will Perform Lower Body Bathing: with supervision;sit to/from stand Pt Will Perform Lower Body Dressing: with supervision;sit to/from stand Pt Will Transfer to Toilet: with min guard assist;ambulating;regular height toilet Pt Will Perform Toileting - Clothing Manipulation and hygiene: with supervision;sit to/from stand Additional ADL Goal #1: Pt will utilize energy conservation strategies in ADL with minimal verbal cues.  OT Frequency: Min 2X/week   Barriers to D/C: Decreased caregiver support          Co-evaluation PT/OT/SLP Co-Evaluation/Treatment: Yes Reason for Co-Treatment: For patient/therapist safety          End of Session Equipment Utilized During Treatment: Gait belt;Oxygen (3L) Nurse Communication: Mobility status  Activity Tolerance: Patient limited by fatigue Patient left: in bed;with call bell/phone within reach;with restraints reapplied;with bed alarm set   Time: 0938-1010 OT Time Calculation (min): 32 min Charges:  OT General Charges $OT  Visit: 1 Procedure OT Evaluation $OT Eval Moderate Complexity: 1 Procedure G-Codes:    Evern BioMayberry, Gregorio Worley Lynn 03/03/2016, 10:32 AM  [phone]

## 2016-03-03 NOTE — Progress Notes (Signed)
MC 4E06 - [facility]: RN GIP Visit at 1020am.   This is a related, covered GIP admission from 02/28/16 per MD Patric DykesJohn Feldmann. Admit to [facility] with COPD. [facility] with presentation of acute respiratory failure and altered mentation after EMS called to home because patient "could not get up". Codestatus is DNI at this time. Patient was seen by her PCP on 1/23 because of URI symptoms (cough and fever)and was prescribed Omnicef 300 mg twice a day without any improvement in her symptoms. Of note,the patient was scheduled to see herpulmonologist on 2/2 but because of recent falls and generalized weakness she was unable to attend that appointment.  Visited patient in room with no visitors at bedside.  Patient was alert and oriented and out of restraints today.  Patient was carrying on full conversations and asked where her phone was.  Maggie, RN, in the room and advised that she did have her phone earlier and was going to look for same.  No additional behavioral issues per Seward GraterMaggie, RN.  Patient was sitting up in bed and ate breakfast this morning with no difficulties.  Patient was supposed to be moved to SNF for Rehab today, but will not be discharged today per respiratory issues per Seward GraterMaggie, RN.  Patient receiving PULMICORT twice daily and DUONEB four times daily.  Patient also receiving piperacillin-tazobactam (ZOSYN) IVPB  3.375g, Q8h via IV.  Patient also receiving predniSONE (DELTASONE) tablet 5 mg Daily with breakfast, PO.    [facility] will continue to see patient daily while in hospital.  Thank you,  Adele BarthelAmy Evans, RN, BSN [facility]iaison [phone]  All hospice liaison's are now on AMION.   Please feel free to call me at the above number or call hospice at [phone] after 5pm.

## 2016-03-03 NOTE — Clinical Social Work Note (Signed)
Clinical Social Work Assessment  Patient Details  Name: Nicole Maxwell MRN: [id number] Date of Birth: 08-13-1945  Date of referral:  03/03/16               Reason for consult:  Facility Placement, Discharge Planning                Permission sought to share information with:  Facility Medical sales representativeContact Representative, Family Supports Permission granted to share information::  Yes, Verbal Permission Granted  Name::     Tobin ChadMaria Belin  Agency::  SNF's  Relationship::  Daughter  Contact Information:  [phone]  Housing/Transportation Living arrangements for the past 2 months:  Apartment Source of Information:  Medical Team, Adult Children Patient Interpreter Needed:  None Criminal Activity/Legal Involvement Pertinent to Current Situation/Hospitalization:  No - Comment as needed Significant Relationships:  Adult Children, Other Family Members, Friend Lives with:  Self Do you feel safe going back to the place where you live?  No Need for family participation in patient care:  Yes (Comment)  Care giving concerns:  PT recommending SNF once medically stable for discharge.   Social Worker assessment / plan:  Patient not fully oriented and daughter not in the room. CSW called patient's daughter and introduced role and explained that PT recommendations would be discussed. Patient's daughter has been touring SNF's today. [facility] time of phone call. Patient's daughter confirmed plan for SNF with palliative care. [facility] does not have a bed until Friday but CSW will fax referral to other facilities in the area. No further concerns. CSW encouraged patient's daughter to contact CSW as needed. CSW will continue to follow patient for support and facilitate discharge to SNF once medically stable.  Employment status:  Retired Database administratornsurance information:  Managed Medicare PT Recommendations:  Skilled Nursing Facility Information / Referral to community resources:  Skilled Nursing  Facility  Patient/Family's Response to care:  Patient not fully oriented. Patient's daughter agreeable to SNF placement. Patient's family supportive and involved in patient's care. Patient's daughter appreciated social work intervention.  Patient/Family's Understanding of and Emotional Response to Diagnosis, Current Treatment, and Prognosis:  Patient not fully oriented. Patient's daughter has a good understanding of the reason for admission. Patient's daughter appears happy with hospital care.  Emotional Assessment Appearance:  Appears stated age Attitude/Demeanor/Rapport:  Unable to Assess Affect (typically observed):  Unable to Assess Orientation:  Oriented to Self, Oriented to Place, Oriented to  Time Alcohol / Substance use:  Tobacco Use Psych involvement (Current and /or in the community):  No (Comment)  Discharge Needs  Concerns to be addressed:  Care Coordination Readmission within the last 30 days:  No Current discharge risk:  Cognitively Impaired, Dependent with Mobility, Lives alone Barriers to Discharge:  Continued Medical Work up   Margarito LinerSarah C Ayane Delancey, LCSW 03/03/2016, 4:31 PM

## 2016-03-03 NOTE — Progress Notes (Signed)
[facility] TEAM 1 - Stepdown/ICU TEAM  POONAM WOEHRLE  YNW:[id number] DOB: 10-08-1945 DOA: 02/28/2016 PCP: [name], MD    Brief Narrative:  71 y.o. female with a history of severe COPD on chronic nasal cannula oxygen as well as nocturnal BiPAP,  steroid-induced diabetes, hypertension, intermittent dysphagia related to esophageal dysmotility, physical deconditioning and protein calorie malnutrition, normocytic anemia, and dyslipidemia. Patient was seen by her PCP on 1/23 because of URI symptoms and prescribed Omnicef without any improvement.  On the day of admission EMS was called after the patient went to the bathroom and "couldn't get up".   Subjective: The patient is more alert today but not yet fully oriented.  She is quite embarrassed that she was confused 2 days ago.  She denies current chest pain nausea vomiting or abdominal pain.  Assessment & Plan:  Acute on chronic respiratory failure with hypoxia and hypercapnia  ABG at presentation:  PH 7.24, pCO2 85.5, pO2 63 - improved w/ holding of sedating meds and short term use of BIPAP - care w/ meds - BIPAP each night as pt will allow per her home regimen   Severe chronic obstructive pulmonary disease w/ acute exacerbation  on 2 L oxygen at home and nocturnal BiPAP which she only tolerates for about 4 hours nightly - wean back to home O2 dose as able - cont maximal medical care for COPD - followed by Dr Marchelle Gearing as outpt and Pulm consulted as inpt at family request  Acute encephalopathy / acute delirium  likely related to sundowning - this is interfering w/ her care as she refused BIPAP last night - begin trial of seroquel tonight - B12 and folic acid normal - is improving    Chronic LLL opacity - ?acute aspiration PNA chronic LLL opacity clarified on CT chest Feb 2017 consistent with small airway impaction with posterior left lower lobe mild collapse/consolidation versus atypical infection - cont empiric abx as per PCCM to include  coverage for aspiration   Acute RUQ abdom pain Reported fall at home but details scant - CT abdom w/o acute findings - most c/w superficial injury likely due to fall - resolved     HTN No change in tx today   Chronic diastolic congestive heart failure  Baseline wgt ~54kg - no evidence of acute exacerbation at this time - follow daily weights and Is and Os  Filed Weights   02/28/16 1651 03/02/16 0500 03/03/16 0434  Weight: 53.7 kg (118 lb 6.2 oz) 54.6 kg (120 lb 5.9 oz) 54.6 kg (120 lb 5.9 oz)    Esophageal dysmotility esophagram Nov 2017 documented 50% narrowing at C5-C6 level with additional findings consistent with esophageal spasm - cleared for regular diet per SLP this admit   DM 2 CBG currently controlled  Physical deconditioning   Normocytic anemia Baseline hemoglobin appears to be 9 - 10.2 - no evidence of gross blood loss - Hgb is stable   Goals of Care Patient is followed by home hospice but remains full code with exception to a DO NOT INTUBATE order - Palliative Care working w/ family to establish more appropriate GOC   DVT prophylaxis: SCDs Code Status: DO NOT INTUBATE - LIMITED CODE Family Communication: No family present at time of exam today Disposition Plan: SDU  Consultants:  Pulmonary Palliative Care  Procedures: none  Antimicrobials:  Azithromycin 2/3 Cefepime 2/3 > 2/5 Ceftriaxone 2/3 Vancomycin 2/3 > 2/4 Zosyn 2/5 >  Objective: Blood pressure (!) 138/56, pulse 84, temperature 99.1  F (37.3 C), temperature source Oral, resp. rate (!) 29, height 5\' 5"  (1.651 m), weight 54.6 kg (120 lb 5.9 oz), SpO2 100 %.  Intake/Output Summary (Last 24 hours) at 03/03/16 1431 Last data filed at 03/03/16 1326  Gross per 24 hour  Intake          1895.75 ml  Output              775 ml  Net          1120.75 ml   Filed Weights   02/28/16 1651 03/02/16 0500 03/03/16 0434  Weight: 53.7 kg (118 lb 6.2 oz) 54.6 kg (120 lb 5.9 oz) 54.6 kg (120 lb 5.9 oz)     Examination: General: no acute resp distress - alert  Lungs: Improved air movement throughout all fields with no active wheezing Cardiovascular: Regular rate and rhythm without murmur Abdomen: Nontender nondistended soft bowel sounds positive Extremities: No significant edema bilateral lower extremities  CBC:  Recent Labs Lab 02/28/16 0647 02/28/16 0653 02/29/16 0648 03/01/16 1232 03/02/16 0348 03/03/16 0250  WBC 24.6*  --  24.7* 21.1* 21.1* 16.4*  NEUTROABS 21.1*  --   --   --   --   --   HGB 8.3* 10.2* 7.6* 7.9* 8.6* 8.2*  HCT 30.4* 30.0* 26.3* 28.4* 30.4* 29.7*  MCV 84.0  --  81.2 80.9 81.3 80.5  PLT 274  --  223 269 235 302   Basic Metabolic Panel:  Recent Labs Lab 02/28/16 0647 02/28/16 0653 02/29/16 0648 03/02/16 0348 03/03/16 0250  NA 144 142 143 146* 146*  K 4.2 4.0 4.3 3.7 3.8  CL 101 98* 101 102 101  CO2 32  --  32 34* 35*  GLUCOSE 183* 176* 107* 86 146*  BUN 24* 29* 16 11 13   CREATININE 0.96 1.00 0.72 0.64 0.76  CALCIUM 9.4  --  8.6* 9.2 8.9  MG  --   --   --   --  1.9   GFR: Estimated Creatinine Clearance: 55.6 mL/min (by C-G formula based on SCr of 0.76 mg/dL).  Liver Function Tests:  Recent Labs Lab 02/28/16 0647 02/29/16 0648  AST 22 17  ALT 17 14  ALKPHOS 61 50  BILITOT 0.6 0.4  PROT 6.3* 5.4*  ALBUMIN 3.2* 2.5*    Recent Labs Lab 02/28/16 0647  LIPASE 16   Coagulation Profile:  Recent Labs Lab 02/28/16 0815  INR 0.97    HbA1C: Hgb A1c MFr Bld  Date/Time Value Ref Range Status  02/28/2016 08:32 AM 6.0 (H) 4.8 - 5.6 % Final    Comment:    (NOTE)         Pre-diabetes: 5.7 - 6.4         Diabetes: >6.4         Glycemic control for adults with diabetes: <7.0   10/17/2015 05:36 PM 6.4 (H) 4.8 - 5.6 % Final    Comment:    (NOTE)         Pre-diabetes: 5.7 - 6.4         Diabetes: >6.4         Glycemic control for adults with diabetes: <7.0     CBG:  Recent Labs Lab 03/03/16 0200 03/03/16 0526 03/03/16 0853  03/03/16 1104 03/03/16 1238  GLUCAP 180* 88 96 235* 160*    Recent Results (from the past 240 hour(s))  Culture, blood (routine x 2) Call MD if unable to obtain prior to antibiotics being given  Status: None (Preliminary result)   Collection Time: 02/28/16  8:20 AM  Result Value Ref Range Status   Specimen Description BLOOD RIGHT ANTECUBITAL  Final   Special Requests IN PEDIATRIC BOTTLE  4CC  Final   Culture NO GROWTH 3 DAYS  Final   Report Status PENDING  Incomplete  Culture, blood (routine x 2) Call MD if unable to obtain prior to antibiotics being given     Status: None (Preliminary result)   Collection Time: 02/28/16  8:32 AM  Result Value Ref Range Status   Specimen Description BLOOD RIGHT HAND  Final   Special Requests IN PEDIATRIC BOTTLE  1CC  Final   Culture NO GROWTH 3 DAYS  Final   Report Status PENDING  Incomplete  Urine culture     Status: Abnormal   Collection Time: 02/28/16 12:59 PM  Result Value Ref Range Status   Specimen Description URINE, RANDOM  Final   Special Requests NONE  Final   Culture MULTIPLE SPECIES PRESENT, SUGGEST RECOLLECTION (A)  Final   Report Status 02/29/2016 FINAL  Final  MRSA PCR Screening     Status: None   Collection Time: 02/28/16  5:34 PM  Result Value Ref Range Status   MRSA by PCR NEGATIVE NEGATIVE Final    Comment:        The GeneXpert MRSA Assay (FDA approved for NASAL specimens only), is one component of a comprehensive MRSA colonization surveillance program. It is not intended to diagnose MRSA infection nor to guide or monitor treatment for MRSA infections.   Culture, sputum-assessment     Status: None   Collection Time: 02/29/16  4:48 AM  Result Value Ref Range Status   Specimen Description SPUTUM  Final   Special Requests NONE  Final   Sputum evaluation   Final    Sputum specimen not acceptable for testing.  Please recollect.   Gram Stain Report Called to,Read Back By and Verified With: R. [facility] RN, AT 1610 02/29/16  BY Renato Shin    Report Status 02/29/2016 FINAL  Final     Scheduled Meds: . ALPRAZolam  1 mg Oral QHS  . budesonide (PULMICORT) nebulizer solution  0.25 mg Nebulization BID  . enoxaparin (LOVENOX) injection  40 mg Subcutaneous Q24H  . feeding supplement (GLUCERNA SHAKE)  237 mL Oral TID BM  . fluticasone  1 spray Each Nare BID  . insulin aspart  0-5 Units Subcutaneous QHS  . insulin aspart  0-9 Units Subcutaneous TID WC  . ipratropium-albuterol  3 mL Nebulization QID  . latanoprost  1 drop Both Eyes QHS  . mouth rinse  15 mL Mouth Rinse BID  . piperacillin-tazobactam (ZOSYN)  IV  3.375 g Intravenous Q8H  . predniSONE  5 mg Oral Q breakfast  . QUEtiapine  12.5 mg Oral QHS  . sodium chloride flush  3 mL Intravenous Q12H     LOS: 4 days   Lonia Blood, MD [facility] Office  [phone] Pager - Text Page per Amion as per below:  On-Call/Text Page:      [email or web address]      password [email or web address]  If 7PM-7AM, please contact night-coverage [email or web address] Password [facility] 03/03/2016, 2:31 PM

## 2016-03-03 NOTE — [place] FL2 (Signed)
[place] MEDICAID FL2 LEVEL OF CARE SCREENING TOOL     IDENTIFICATION  Patient Name: Nicole Maxwell Birthdate: Nov 15, 1945 Sex: female Admission Date (Current Location): 02/28/2016  [facility] and [place] Number:  Producer, television/film/video and Address:  [facility], [address] [place], [place] [address]      Provider Number: [id number]  Attending Physician Name and Address:  Lonia Blood, MD  Relative Name and Phone Number:       Current Level of Care: Hospital Recommended Level of Care: Skilled Nursing Facility (with palliative) Prior Approval Number:    Date Approved/Denied:   PASRR Number: [id number]  Discharge Plan: SNF (with palliative)    Current Diagnoses: Patient Active Problem List   Diagnosis Date Noted  . DNR (do not resuscitate) discussion 03/02/2016  . Palliative care by specialist 03/02/2016  . Acute encephalopathy   . Aspiration pneumonia of left lower lobe due to vomit (HCC)   . Essential hypertension   . Chronic diastolic CHF (congestive heart failure) (HCC)   . Acute on chronic respiratory failure with hypoxia and hypercapnia (HCC) 02/28/2016  . Acute LUQ pain 02/28/2016  . Sepsis (HCC) 11/25/2015  . Tobacco use 06/30/2015  . Protein-calorie malnutrition, severe 03/23/2015  . Slow transit constipation 01/07/2015  . Normocytic anemia 11/27/2014  . Physical deconditioning 07/01/2014  . Pulmonary infiltrate in left lung on chest x-ray 04/16/2014  . DM type 2 (diabetes mellitus, type 2) (HCC) 04/16/2014  . Vitamin D deficiency disease 04/16/2014  . Esophageal dysmotility 02/27/2014  . HCAP (healthcare-associated pneumonia) 02/21/2014  . Chronic diastolic congestive heart failure (HCC) 01/07/2014  . Acute on chronic respiratory failure (HCC) 01/03/2014  . COPD exacerbation (HCC) 12/09/2011  . HTN (hypertension) 05/12/2010  . Hyperlipidemia 05/12/2010  . Severe chronic obstructive pulmonary disease (HCC) 04/29/2010     Orientation RESPIRATION BLADDER Height & Weight     Self, Time, Place  O2 (Nasal Canula 3 L. Last used bipap on 2/4: Set rate (10), Respiratory rate (24), Ipap (18), epap (8), O2% (40), Flow rate (3), Minute ventilation (9.6), Leak (14), Peak inspiratory pressure (18), Tidal volume (30)) Continent Weight: 120 lb 5.9 oz (54.6 kg) Height:  5\' 5"  (165.1 cm)  BEHAVIORAL SYMPTOMS/MOOD NEUROLOGICAL BOWEL NUTRITION STATUS   (Has been combative. RN said she hasn't been today (2/7). Restless. Off all restraints.)  (None) Continent Diet (Carb modified. Only IF alert and able to protect airway and NO respiratory distress or sedation.)  AMBULATORY STATUS COMMUNICATION OF NEEDS Skin   Limited Assist Verbally Normal                       Personal Care Assistance Level of Assistance  Bathing, Feeding, Dressing Bathing Assistance: Limited assistance Feeding assistance: Limited assistance Dressing Assistance: Limited assistance     Functional Limitations Info  Sight, Hearing, Speech Sight Info: Adequate Hearing Info: Adequate Speech Info: Adequate    SPECIAL CARE FACTORS FREQUENCY  PT (By licensed PT), Blood pressure, OT (By licensed OT)     PT Frequency: 5 x week OT Frequency: 5 x week            Contractures Contractures Info: Not present    Additional Factors Info  Code Status, Allergies Code Status Info: Partial: DNI Allergies Info: Brovana (Arformoterol Tartrate), Budesonide, Mucomyst (Acetylcysteine)           Current Medications (03/03/2016):  This is the current hospital active medication list Current Facility-Administered Medications  Medication Dose Route Frequency Provider Last Rate Last Dose  . acetaminophen (TYLENOL) tablet 650 mg  650 mg Oral Q6H PRN Russella DarAllison L Ellis, NP       Or  . acetaminophen (TYLENOL) suppository 650 mg  650 mg Rectal Q6H PRN Russella DarAllison L Ellis, NP      . albuterol (PROVENTIL) (2.5 MG/3ML) 0.083% nebulizer solution 2.5 mg  2.5 mg  Nebulization Q2H PRN Lonia BloodJeffrey T McClung, MD      . ALPRAZolam Prudy Feeler(XANAX) tablet 0.5 mg  0.5 mg Oral BID PRN Lonia BloodJeffrey T McClung, MD   0.5 mg at 03/01/16 1001  . ALPRAZolam Prudy Feeler(XANAX) tablet 1 mg  1 mg Oral QHS Lonia BloodJeffrey T McClung, MD   1 mg at 03/02/16 2224  . benzonatate (TESSALON) capsule 200 mg  200 mg Oral TID PRN Lonia BloodJeffrey T McClung, MD      . budesonide (PULMICORT) nebulizer solution 0.25 mg  0.25 mg Nebulization BID Russella DarAllison L Ellis, NP   0.25 mg at 03/03/16 0735  . enoxaparin (LOVENOX) injection 40 mg  40 mg Subcutaneous Q24H Russella DarAllison L Ellis, NP   40 mg at 03/02/16 1619  . feeding supplement (GLUCERNA SHAKE) (GLUCERNA SHAKE) liquid 237 mL  237 mL Oral TID BM Lonia BloodJeffrey T McClung, MD   237 mL at 03/02/16 2025  . fluticasone (FLONASE) 50 MCG/ACT nasal spray 1 spray  1 spray Each Nare BID Russella DarAllison L Ellis, NP   1 spray at 03/03/16 0859  . guaiFENesin (ROBITUSSIN) 100 MG/5ML solution 100 mg  5 mL Oral Q4H PRN Lonia BloodJeffrey T McClung, MD      . HYDROcodone-acetaminophen (NORCO/VICODIN) 5-325 MG per tablet 1 tablet  1 tablet Oral Q4H PRN Lonia BloodJeffrey T McClung, MD   1 tablet at 03/03/16 1330  . hydroxypropyl methylcellulose / hypromellose (ISOPTO TEARS / GONIOVISC) 2.5 % ophthalmic solution 1 drop  1 drop Both Eyes TID PRN Russella DarAllison L Ellis, NP      . insulin aspart (novoLOG) injection 0-5 Units  0-5 Units Subcutaneous QHS Lonia BloodJeffrey T McClung, MD      . insulin aspart (novoLOG) injection 0-9 Units  0-9 Units Subcutaneous TID WC Lonia BloodJeffrey T McClung, MD   3 Units at 02/29/16 1836  . ipratropium-albuterol (DUONEB) 0.5-2.5 (3) MG/3ML nebulizer solution 3 mL  3 mL Nebulization QID Haydee SalterPhillip M Hobbs, MD   3 mL at 03/03/16 1447  . latanoprost (XALATAN) 0.005 % ophthalmic solution 1 drop  1 drop Both Eyes QHS Russella DarAllison L Ellis, NP   1 drop at 03/02/16 2323  . MEDLINE mouth rinse  15 mL Mouth Rinse BID Drema Dallasurtis J Woods, MD      . ondansetron [facility](ZOFRAN) tablet 4 mg  4 mg Oral Q6H PRN Russella DarAllison L Ellis, NP       Or  . ondansetron [facility](ZOFRAN) injection 4  mg  4 mg Intravenous Q6H PRN Russella DarAllison L Ellis, NP      . piperacillin-tazobactam (ZOSYN) IVPB 3.375 g  3.375 g Intravenous Q8H Earnie LarssonFrank R Wilson, RPH   3.375 g at 03/03/16 1326  . predniSONE (DELTASONE) tablet 5 mg  5 mg Oral Q breakfast Lonia BloodJeffrey T McClung, MD   5 mg at 03/03/16 0859  . QUEtiapine (SEROQUEL) tablet 12.5 mg  12.5 mg Oral QHS Lonia BloodJeffrey T McClung, MD   12.5 mg at 03/02/16 2224  . sodium chloride flush (NS) 0.9 % injection 3 mL  3 mL Intravenous Q12H Russella DarAllison L Ellis, NP   3 mL at 03/01/16 2048     Discharge Medications: Please  see discharge summary for a list of discharge medications.  Relevant Imaging Results:  Relevant Lab Results:   Additional Information SS#: [id number]. Will drop hospice services. Has a telesitter but the RN said it could be discontinued.  Margarito Liner, LCSW

## 2016-03-03 NOTE — Evaluation (Signed)
Physical Therapy Evaluation Patient Details Name: Nicole Maxwell MRN: [id number] DOB: 1946/01/21 Today's Date: 03/03/2016   History of Present Illness  Pt is a 71 y/o female admitted after falling at home with respiratory failure with hypoxia and encephalopathy. PMH: end stage COPD with hospice care, dysphagia, chronic steroid use.   Clinical Impression  Pt presented supine in bed with HOB elevated, awake and willing to participate in therapy session. Prior to admission, pt reported that she lives alone and uses a RW to ambulate. Pt has had a decline in function over the past several months and presents as a high fall risk. Pt currently requires supervision for bed mobility, min A for transfers and constant min A with frequent mod A to ambulate with use of RW. Pt very unsteady with ambulation and frequent LOB. Pt ambulated on 3L of O2 via [place] with SPO2 fluctuating between low 80's to mid 90's throughout. Pt would continue to benefit from skilled physical therapy services at this time while admitted and after d/c to address her below listed limitations in order to improve her overall safety and independence with functional mobility.      Follow Up Recommendations SNF;Supervision/Assistance - 24 hour    Equipment Recommendations  None recommended by PT;Other (comment) (defer to next venue)    Recommendations for Other Services       Precautions / Restrictions Precautions Precautions: Fall Precaution Comments: monitor SPO2 Restrictions Weight Bearing Restrictions: No      Mobility  Bed Mobility Overal bed mobility: Needs Assistance Bed Mobility: Supine to Sit;Sit to Supine     Supine to sit: Supervision Sit to supine: Supervision   General bed mobility comments: pt required increased time, supervision for safety  Transfers Overall transfer level: Needs assistance Equipment used: Rolling walker (2 wheeled) Transfers: Sit to/from Stand Sit to Stand: Min assist         General  transfer comment: pt required increased time, VC'ing for bilateral hand placement and min A for stability with transition  Ambulation/Gait Ambulation/Gait assistance: Min assist;Mod assist Ambulation Distance (Feet): 20 Feet Assistive device: Rolling walker (2 wheeled) Gait Pattern/deviations: Step-to pattern;Step-through pattern;Decreased step length - right;Decreased step length - left;Decreased stride length;Drifts right/left;Trunk flexed Gait velocity: decreased Gait velocity interpretation: Below normal speed for age/gender General Gait Details: pt required constant min A for stability and with frequent LOB laterally and posteriorly that required mod A to maintain an upright standing position.  Stairs            Wheelchair Mobility    Modified Rankin (Stroke Patients Only)       Balance Overall balance assessment: Needs assistance Sitting-balance support: Feet supported Sitting balance-Leahy Scale: Fair     Standing balance support: During functional activity;Bilateral upper extremity supported Standing balance-Leahy Scale: Poor Standing balance comment: reliant on B UE support with ambulation                             Pertinent Vitals/Pain Pain Assessment: Faces Faces Pain Scale: Hurts little more Pain Location: chest Pain Descriptors / Indicators: Sore Pain Intervention(s): Repositioned;Monitored during session    Home Living Family/patient expects to be discharged to:: Private residence Living Arrangements: Alone Available Help at Discharge: Family;Available PRN/intermittently             Additional Comments: daughter requesting SNF upon discharge. pt not a reliable historian secondary to cognitive deficits (see below)    Prior Function Level of Independence:  Needs assistance   Gait / Transfers Assistance Needed: walking with a rollator  ADL's / Homemaking Assistance Needed: reports being able to make meals and care for herself, sister  brought in meals        Hand Dominance   Dominant Hand: Right    Extremity/Trunk Assessment   Upper Extremity Assessment Upper Extremity Assessment: Defer to OT evaluation    Lower Extremity Assessment Lower Extremity Assessment: Generalized weakness       Communication   Communication: No difficulties  Cognition Arousal/Alertness: Awake/alert Behavior During Therapy: WFL for tasks assessed/performed Overall Cognitive Status: Impaired/Different from baseline Area of Impairment: Orientation;Memory;Safety/judgement Orientation Level: Disoriented to;Time;Situation   Memory: Decreased short-term memory   Safety/Judgement: Decreased awareness of safety;Decreased awareness of deficits     General Comments: pt aware that she is unsafe to go home alone    General Comments      Exercises     Assessment/Plan    PT Assessment Patient needs continued PT services  PT Problem List Decreased strength;Decreased activity tolerance;Decreased mobility;Decreased balance;Decreased coordination;Decreased cognition;Decreased knowledge of use of DME;Decreased safety awareness;Cardiopulmonary status limiting activity;Pain          PT Treatment Interventions Gait training;DME instruction;Stair training;Functional mobility training;Therapeutic activities;Therapeutic exercise;Neuromuscular re-education;Balance training;Cognitive remediation;Patient/family education    PT Goals (Current goals can be found in the Care Plan section)  Acute Rehab PT Goals Patient Stated Goal: to get better PT Goal Formulation: With patient Time For Goal Achievement: 03/17/16 Potential to Achieve Goals: Fair    Frequency Min 2X/week   Barriers to discharge        Co-evaluation PT/OT/SLP Co-Evaluation/Treatment: Yes Reason for Co-Treatment: For patient/therapist safety PT goals addressed during session: Mobility/safety with mobility;Balance;Proper use of DME;Strengthening/ROM         End of  Session Equipment Utilized During Treatment: Gait belt;Oxygen Activity Tolerance: Patient limited by fatigue Patient left: in bed;with call bell/phone within reach;with bed alarm set;with restraints reapplied (Posey belt) Nurse Communication: Mobility status         Time: 1610-9604 PT Time Calculation (min) (ACUTE ONLY): 32 min   Charges:   PT Evaluation $PT Eval Moderate Complexity: 1 Procedure     PT G CodesAlessandra Bevels Shaynah Hund 03/03/2016, 11:29 AM Deborah Chalk, PT, DPT [phone]

## 2016-03-03 NOTE — Care Management Note (Signed)
Case Management Note  Patient Details  Name: Nicole Maxwell MRN: [id number] Date of Birth: 07/04/1945  Subjective/Objective:    [name], she spoke with daughter and she would like for her mother to get rehab with palliative (snf),  She was informed that she would have to give up her hospice and she states she was ok with that [facility].  Await pt/ot eval.    2/7 1432 Nicole Capeeborah Victoriya Pol RN, BSN - NCM spoke with [name], patient's daughgter, she states she would like for mother to go to a SNF with palliative following, NCM made referral to CSW.                             Action/Plan:   Expected Discharge Date:                  Expected Discharge Plan:  Skilled Nursing Facility  In-House Referral:  Clinical Social Work  Discharge planning Services  CM Consult  Post Acute Care Choice:    Choice offered to:     DME Arranged:    DME Agency:     HH Arranged:    HH Agency:     Status of Service:  In process, will continue to follow  If discussed at Long Length of Stay Meetings, dates discussed:    Additional Comments:  Nicole Maxwell, Nicole Ruark Clinton, RN 03/03/2016, 2:51 PM

## 2016-03-03 NOTE — Clinical Social Work Placement (Signed)
   CLINICAL SOCIAL WORK PLACEMENT  NOTE  Date:  03/03/2016  Patient Details  Name: Nicole Maxwell MRN: [id number] Date of Birth: 12/11/1945  Clinical Social Work is seeking post-discharge placement for this patient at the Skilled  Nursing Facility level of care (*CSW will initial, date and re-position this form in  chart as items are completed):  Yes   Patient/family provided with [facility] Clinical Social Work Department's list of facilities offering this level of care within the geographic area requested by the patient (or if unable, by the patient's family).  Yes   Patient/family informed of their freedom to choose among providers that offer the needed level of care, that participate in Medicare, Medicaid or managed care program needed by the patient, have an available bed and are willing to accept the patient.  Yes   Patient/family informed of [facility]'s ownership interest in [facility] and [facility], as well as of the fact that they are under no obligation to receive care at these facilities.  PASRR submitted to EDS on 03/03/16     PASRR number received on       Existing PASRR number confirmed on 03/03/16     FL2 transmitted to all facilities in geographic area requested by pt/family on 03/03/16     FL2 transmitted to all facilities within larger geographic area on       Patient informed that his/her managed care company has contracts with or will negotiate with certain facilities, including the following:            Patient/family informed of bed offers received.  Patient chooses bed at       Physician recommends and patient chooses bed at      Patient to be transferred to   on  .  Patient to be transferred to facility by       Patient family notified on   of transfer.  Name of family member notified:        PHYSICIAN Please sign FL2     Additional Comment:    _______________________________________________ Margarito LinerSarah C Trevonte Ashkar, LCSW 03/03/2016, 4:34  PM

## 2016-03-03 NOTE — Progress Notes (Signed)
   Met w/ patient @ bedside.  Will follow, as needed.  - Rev. [place] MDiv ThM

## 2016-03-04 DIAGNOSIS — R5381 Other malaise: Secondary | ICD-10-CM

## 2016-03-04 DIAGNOSIS — E43 Unspecified severe protein-calorie malnutrition: Secondary | ICD-10-CM

## 2016-03-04 LAB — CULTURE, BLOOD (ROUTINE X 2)
CULTURE: NO GROWTH
CULTURE: NO GROWTH

## 2016-03-04 LAB — GLUCOSE, CAPILLARY
GLUCOSE-CAPILLARY: 164 mg/dL — AB (ref 65–99)
Glucose-Capillary: 100 mg/dL — ABNORMAL HIGH (ref 65–99)

## 2016-03-04 MED ORDER — QUETIAPINE FUMARATE 25 MG PO TABS: 12.5000 mg | ORAL_TABLET | Freq: Every day | ORAL | 0 refills | Status: AC

## 2016-03-04 MED ORDER — LATANOPROST 0.005 % OP SOLN: 1.0000 [drp] | Freq: Every day | OPHTHALMIC | 0 refills | Status: AC

## 2016-03-04 MED ORDER — AMOXICILLIN-POT CLAVULANATE 875-125 MG PO TABS
1.0000 | ORAL_TABLET | Freq: Two times a day (BID) | ORAL | Status: DC
  Administered 2016-03-04: 1 via ORAL
  Filled 2016-03-04 (×2): qty 1

## 2016-03-04 MED ORDER — ALPRAZOLAM 1 MG PO TABS: 1.0000 mg | ORAL_TABLET | Freq: Every day | ORAL | 0 refills | Status: AC

## 2016-03-04 MED ORDER — ALBUTEROL SULFATE HFA 108 (90 BASE) MCG/ACT IN AERS: 2.0000 | INHALATION_SPRAY | Freq: Four times a day (QID) | RESPIRATORY_TRACT | 0 refills | Status: AC | PRN

## 2016-03-04 MED ORDER — HYPROMELLOSE (GONIOSCOPIC) 2.5 % OP SOLN: 1.0000 [drp] | Freq: Three times a day (TID) | OPHTHALMIC | 12 refills | Status: AC | PRN

## 2016-03-04 MED ORDER — GLUCERNA SHAKE PO LIQD: 237.0000 mL | Freq: Three times a day (TID) | ORAL | 0 refills | Status: DC

## 2016-03-04 MED ORDER — GI COCKTAIL ~~LOC~~
30.0000 mL | Freq: Once | ORAL | Status: DC
  Filled 2016-03-04: qty 30

## 2016-03-04 MED ORDER — HYDROCODONE-ACETAMINOPHEN 5-325 MG PO TABS: 1.0000 | ORAL_TABLET | ORAL | 0 refills | Status: AC | PRN

## 2016-03-04 MED ORDER — MUCINEX DM MAXIMUM STRENGTH 60-1200 MG PO TB12: 1.0000 | ORAL_TABLET | Freq: Two times a day (BID) | ORAL | 0 refills | Status: AC

## 2016-03-04 MED ORDER — IPRATROPIUM-ALBUTEROL 0.5-2.5 (3) MG/3ML IN SOLN: 3.0000 mL | Freq: Four times a day (QID) | RESPIRATORY_TRACT | 0 refills | Status: AC

## 2016-03-04 MED ORDER — FLUTICASONE PROPIONATE 50 MCG/ACT NA SUSP: 1.0000 | Freq: Two times a day (BID) | NASAL | 0 refills | Status: AC

## 2016-03-04 MED ORDER — PREDNISONE 5 MG PO TABS: 5.0000 mg | ORAL_TABLET | Freq: Every day | ORAL | 0 refills | Status: AC

## 2016-03-04 MED ORDER — ALBUTEROL SULFATE (2.5 MG/3ML) 0.083% IN NEBU: 2.5000 mg | INHALATION_SOLUTION | RESPIRATORY_TRACT | 0 refills | Status: AC | PRN

## 2016-03-04 MED ORDER — ATORVASTATIN CALCIUM 20 MG PO TABS: 20.0000 mg | ORAL_TABLET | Freq: Every day | ORAL | 0 refills | Status: DC

## 2016-03-04 MED ORDER — AMOXICILLIN-POT CLAVULANATE 875-125 MG PO TABS: 1.0000 | ORAL_TABLET | Freq: Two times a day (BID) | ORAL | 0 refills | Status: DC

## 2016-03-04 MED ORDER — ALPRAZOLAM 0.5 MG PO TABS: 0.5000 mg | ORAL_TABLET | Freq: Two times a day (BID) | ORAL | 0 refills | Status: AC | PRN

## 2016-03-04 MED ORDER — BUDESONIDE-FORMOTEROL FUMARATE 160-4.5 MCG/ACT IN AERO
2.0000 | INHALATION_SPRAY | Freq: Two times a day (BID) | RESPIRATORY_TRACT | 0 refills | Status: AC
Start: 2016-03-04 — End: ?

## 2016-03-04 NOTE — Consult Note (Signed)
Name: Nicole Maxwell MRN: [id number] DOB: 06-Jan-1946    ADMISSION DATE:  02/28/2016 CONSULTATION DATE:  2/5  REFERRING MD :  Dr. Sharon Seller  CHIEF COMPLAINT:  SOB  BRIEF PATIENT DESCRIPTION: 71 year old female with severe COPD on home O2. She resides in SNF where she suffered a fall and was minimally responsive 2/3. Improved with COPD treatments and withholding of sedating meds. Family requesting pulmonary eval   SUBJECTIVE: Pt reports feeling better, no acute events.  Notes indigestion and points to mid-epigastric area.  O2 at 3L  VITAL SIGNS: Temp:  [98.3 F (36.8 C)-99.3 F (37.4 C)] 99 F (37.2 C) (02/08 1116) Pulse Rate:  [77-97] 88 (02/08 1116) Resp:  [14-21] 14 (02/08 1116) BP: (109-154)/(54-77) 141/58 (02/08 1116) SpO2:  [97 %-100 %] 99 % (02/08 1116) Weight:  [121 lb 14.6 oz (55.3 kg)] 121 lb 14.6 oz (55.3 kg) (02/08 0502)  PHYSICAL EXAMINATION: General:  Frail adult female in NAD HEENT: MM pink/moist PSY: normal mood  Neuro: AAOx4, speech clear, MAE CV: s1s2 rrr, no m/r/g PULM: even/non-labored, lungs bilaterally diminished but clear WU:JWJX, non-tender, bsx4 active  Extremities: warm/dry, no edema  Skin: no rashes or lesions   Recent Labs Lab 02/29/16 0648 03/02/16 0348 03/03/16 0250  NA 143 146* 146*  K 4.3 3.7 3.8  CL 101 102 101  CO2 32 34* 35*  BUN 16 11 13   CREATININE 0.72 0.64 0.76  GLUCOSE 107* 86 146*    Recent Labs Lab 03/01/16 1232 03/02/16 0348 03/03/16 0250  HGB 7.9* 8.6* 8.2*  HCT 28.4* 30.4* 29.7*  WBC 21.1* 21.1* 16.4*  PLT 269 235 302   No results found.  SIGNIFICANT EVENTS  2/3 admit for hypercarbic respiratory failure 2/8 clinically improved, at baseline  STUDIES:  CT scan 2/3 > Aortic atherosclerosis. Coronary artery calcifications are noted suggesting coronary artery disease. Airspace opacities are noted in lingular segment of left lower lobe as well as left lower lobe most consistent with pneumonia with small associated  left pleural effusion. Mild emphysematous disease is noted in the upper lobes bilaterally. Multilevel degenerative disc disease and postoperative changes are noted in the lower lumbar spine. Large left renal cysts are noted.   ASSESSMENT / PLAN:  1. Acute on chronic hypoxemic/hypercarbic respiratory failure secondary to COPD exacerbation.   2.  Acute Exacerbation of COPD   3.  Chronic LLL Opacity vs Acute Aspiration PNA   4. Hx Esophageal Dismotlity   5.  Chronic Diastolic CHF   Plan: Continue O2, wean for sats 88-94% QHS BiPAP & PRN daytime sleep  Defer abx to primary  Wean steroids to baseline 5mg  Continue with palliative care involvement Trend I/O's Follow up with Dr. Marchelle Gearing as outpatient  GI cocktail x1 Plan for d/c on duoneb QID, PRN albuterol, Symbicort (may be reaching a point where her disease is severe enough she can not adequately inhale > needs nebs only) and wean back to baseline 5mg  prednisone slowly    PCCM will be available PRN.  Please call back if new needs arise.   Canary Brim, NP-C [facility] Pgr: [phone] or if no answer [phone] 03/04/2016, 11:31 AM  STAFF NOTE: I, Rory Percy, MD FACP have personally reviewed patient's available data, including medical history, events of note, physical examination and test results as part of my evaluation. I have discussed with resident/NP and other care providers such as pharmacist, RN and RRT. In addition, I personally evaluated patient and elicited key findings  of: awake, moves lung sounds better then 48 hours ago, has chronic lower chest pain, she is rx gi cocktail, she also no longer has basilar ronchi, her aspiration is improving, Augmentin on board, requires 7 days abx as remains culture negative, continue her typical lung regimen, pred at 5, will sign off ,c all if needed  Mcarthur RossettiDaniel J. Tyson AliasFeinstein, MD, FACP Pgr: [phone] [facility] 03/04/2016 3:50 PM

## 2016-03-04 NOTE — Progress Notes (Signed)
Discussed discharge instructions and medications with patient.Patient needs reinforcement, intermittently confused. Called report to CuttenSharlene, Charity fundraiserN at [facility]. They are aware to set up several appointments for patient. Removed PIV.  VSS. P'st daughter aware of time of discharge.  Awaiting PTAR.  [facility] Ledora Delker,RN

## 2016-03-04 NOTE — Clinical Social Work Note (Signed)
CSW facilitated patient discharge including contacting patient family (left voicemail for daughter when transport called but discussed with her earlier today) and facility to confirm patient discharge plans. Clinical information faxed to facility and family agreeable with plan. CSW arranged ambulance transport via PTAR to [facility]. RN to call report prior to discharge [phone]).  CSW will sign off for now as social work intervention is no longer needed. Please consult us again if new needs arise.  Charlynn CourtSarah Modesto Ganoe, CSW [phone]

## 2016-03-04 NOTE — Progress Notes (Addendum)
[facility]  Spoke with Maralyn SagoSarah, [place], who advised that patient is to go to SNF today for Rehab and to be followed by palliative.    I have left message for Thurston Holenne, CSW, with [facility] to come and do revocation with patient.  Thurston Holenne called back and advised she would come do hospice revocation around 1230pm.   Thank you,  Adele BarthelAmy Evans, RN, BSN [facility] [phone]  All hospital liaison's are now on AMION.   Please feel free to contact me at the above number with questions and concerns regarding hospice or call hospice at [phone] after 5pm.

## 2016-03-04 NOTE — Clinical Social Work Note (Signed)
CSW provided patient's daughter with bed offers over the phone. She has accepted the bed offer from [facility]. Patient will need PTAR. SNF aware.  Charlynn CourtSarah Willie Plain, CSW [phone]

## 2016-03-04 NOTE — Discharge Summary (Signed)
Physician Discharge Summary  Nicole Maxwell NWG:[id number] DOB: Jun 08, 1945 DOA: 02/28/2016  PCP: [name], MD  Admit date: 02/28/2016 Discharge date: 03/04/2016  Time spent: 35 minutes  Recommendations for Outpatient Follow-up:  Acute on chronic respiratory failure with hypoxia and hypercapnia  -Acute component has resolved -Continue to be very judicious with sedating medication -BIPAP per her home regimen   Severe chronic obstructive pulmonary disease w/ acute exacerbation  -on 2 L oxygen at home and nocturnal BiPAP which she only tolerates for about 4 hours nightly -Per [facility] M note 2/5 Dr.Daniel Tyson Alias if patient declines SHOULD NOT BE INTUBATED. Complete seven-day course antibiotics. -Schedule follow-up with Dr Marchelle Gearing as outpt in 2 weeks for acute on chronic respiratory failure with hypoxia, COPD.   Acute encephalopathy / acute delirium  -Resolved appears to be back to baseline   - Seroquel 12.5 mg QHS  Chronic LLL opacity/Acute Aspiration PNA -chronic LLL opacity clarified on CT chest Feb 2017 consistent with small airway impaction with posterior left lower lobe mildcollapse/consolidation versus atypical infection  - Per [facility] M complete seven-day course antibiotics - Continue supplemental O2 to keep sats > 88-92% - QHS and nocturnal BiPAP - Continue nebs and low dose prednisone  Acute RUQ abdom pain -CT abdom w/o acute findings - most c/w superficial injury likely due to fall - no c/o abdom pain today   HTN -Mildly elevated but given patient's age and multiple falls would not attend to lower further.  -Schedule follow-up with Dr. [name] in 2-3 weeks HTN, chronic diastolic CHF, diabetes type 2  Chronic diastolic congestive heart failure  -Baseline wgt ~54kg - no evidence of acute exacerbation at this time  -Strict in and out since admission +2.5 L -Daily weight Filed Weights   03/02/16 0500 03/03/16 0434 03/04/16 0502  Weight: 54.6 kg (120 lb 5.9 oz) 54.6  kg (120 lb 5.9 oz) 55.3 kg (121 lb 14.6 oz)   Esophageal dysmotility -esophagram Nov 2017 documented 50% narrowing at C5-C6 level with additional findings consistent with esophageal spasm  -Cleared for regular diet  DM Type  2 controlled with complication -2/3 Hemoglobin A1c= 6.0 -CBG currently controlled  Physical deconditioning   Normocytic anemia(Baseline hemoglobin appears to be 9 - 10.2)  - no evidence of gross blood loss  - follow Hgb w/ downward trend   Recent Labs Lab 02/28/16 0647 02/28/16 0653 02/29/16 0648 03/01/16 1232 03/02/16 0348 03/03/16 0250  HGB 8.3* 10.2* 7.6* 7.9* 8.6* 8.2*  -Stable     Discharge Diagnoses:  Principal Problem:   Acute on chronic respiratory failure with hypoxia and hypercapnia (HCC) Active Problems:   Severe chronic obstructive pulmonary disease (HCC)   HTN (hypertension)   Hyperlipidemia   Chronic diastolic congestive heart failure (HCC)   HCAP (healthcare-associated pneumonia)   Esophageal dysmotility   DM type 2 (diabetes mellitus, type 2) (HCC)   Physical deconditioning   Normocytic anemia   Protein-calorie malnutrition, severe   Sepsis (HCC)   Acute LUQ pain   DNR (do not resuscitate) discussion   Palliative care by specialist   Acute encephalopathy   Aspiration pneumonia of left lower lobe due to vomit [facility])   Essential hypertension   Chronic diastolic CHF (congestive heart failure) (HCC)   Discharge Condition: Stable  Diet recommendation: Regular  Filed Weights   03/02/16 0500 03/03/16 0434 03/04/16 0502  Weight: 54.6 kg (120 lb 5.9 oz) 54.6 kg (120 lb 5.9 oz) 55.3 kg (121 lb 14.6 oz)    History of  present illness:  71 y.o. 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 Chronic back pain  Patient was seen by her PCP on 1/23 because of URI symptoms and prescribed Omnicef without any improvement. On the day of admission EMS was called after the patient went to the bathroom and "couldn't get up".  During his hospitalization diagnosed with acute on chronic respiratory failure with hypoxia and hypercapnia secondary to aspiration pneumonia. With appropriate antibiotics and supportive care has resolved. Patient also with acute encephalopathy secondary to hypoxia which also has resolved. Stable for discharge    Consultants:  Dr. Nelda Bucks PC CM    Procedures/Significant Events:  10/3 CT chest wo contrast: -Airspace opacities are noted in lingular segment of left lower lobe as well as left lower lobe most consistent with pneumonia with small associated left pleural effusion. -Mild emphysematous disease is noted in the upper lobes bilaterally. -Multilevel degenerative disc disease and postoperative changes are noted in the lower lumbar spine.   Antibiotics Anti-infectives    Start     Stop   03/04/16 1230  amoxicillin-clavulanate (AUGMENTIN) 875-125 MG per tablet 1 tablet         03/01/16 1800  piperacillin-tazobactam (ZOSYN) IVPB 3.375 g  Status:  Discontinued     03/04/16 1102   02/29/16 1000  vancomycin (VANCOCIN) IVPB 750 mg/150 ml premix  Status:  Discontinued     02/29/16 1126   02/28/16 0830  ceFEPIme (MAXIPIME) 1 g in dextrose 5 % 50 mL IVPB  Status:  Discontinued     03/01/16 1359   02/28/16 0830  vancomycin (VANCOCIN) IVPB 1000 mg/200 mL premix     02/28/16 1122   02/28/16 0715  cefTRIAXone (ROCEPHIN) 1 g in dextrose 5 % 50 mL IVPB     02/28/16 0754   02/28/16 0715  azithromycin (ZITHROMAX) 500 mg in dextrose 5 % 250 mL IVPB     02/28/16 0916       Discharge Exam: Vitals:   03/04/16 0502 03/04/16 0752 03/04/16 0754 03/04/16 1116  BP: 109/65  (!) 149/57 (!) 141/58  Pulse: 78   77 88  Resp: 20  18 14   Temp: 98.6 F (37 C)  98.8 F (37.1 C) 99 F (37.2 C)  TempSrc: Oral  Oral Oral  SpO2: 100% 97% 100% 99%  Weight: 55.3 kg (121 lb 14.6 oz)     Height:        General: A/O 3 (does not know why) Cardiovascular: Regular rate and rhythm, negative murmurs rubs or gallops, normal S1/S2 Respiratory: By basilar rhonchi remainder of lung fields clear to auscultation    Discharge Instructions   Allergies as of 03/04/2016      Reactions   Brovana [arformoterol Tartrate] Shortness Of Breath, Cough   General intolerance   Budesonide Shortness Of Breath, Cough   General intolerance   Mucomyst [acetylcysteine] Shortness Of Breath      Medication List    STOP taking these medications   amLODipine 10 MG tablet Commonly known as:  NORVASC   cefdinir 300 MG capsule Commonly known as:  OMNICEF   DULoxetine 60 MG capsule Commonly known as:  CYMBALTA   furosemide 20 MG tablet Commonly known as:  LASIX   losartan 25 MG tablet Commonly known as:  COZAAR  metFORMIN 500 MG tablet Commonly known as:  GLUCOPHAGE   pantoprazole 40 MG tablet Commonly known as:  PROTONIX   polyethylene glycol packet Commonly known as:  MIRALAX / GLYCOLAX   potassium chloride SA 20 MEQ tablet Commonly known as:  K-DUR,KLOR-CON   PRESCRIPTION MEDICATION     TAKE these medications   acetaminophen 500 MG tablet Commonly known as:  TYLENOL Take 500 mg by mouth every 6 (six) hours as needed for mild pain.   albuterol 108 (90 Base) MCG/ACT inhaler Commonly known as:  PROAIR HFA Inhale 2 puffs into the lungs every 6 (six) hours as needed.   albuterol (2.5 MG/3ML) 0.083% nebulizer solution Commonly known as:  PROVENTIL Take 3 mLs (2.5 mg total) by nebulization every 4 (four) hours as needed for wheezing or shortness of breath. (may take 1 every 4 hours as needed for wheezing/shortness of breath) DX: 496   ALPRAZolam 0.5 MG tablet Commonly known as:  XANAX Take 1 tablet  (0.5 mg total) by mouth 2 (two) times daily as needed for anxiety. What changed:  medication strength  how much to take  how to take this  when to take this  reasons to take this  additional instructions   ALPRAZolam 1 MG tablet Commonly known as:  XANAX Take 1 tablet (1 mg total) by mouth at bedtime. What changed:  You were already taking a medication with the same name, and this prescription was added. Make sure you understand how and when to take each.   amoxicillin-clavulanate 875-125 MG tablet Commonly known as:  AUGMENTIN Take 1 tablet by mouth every 12 (twelve) hours.   aspirin 81 MG EC tablet Take 1 tablet (81 mg total) by mouth daily.   atorvastatin 20 MG tablet Commonly known as:  LIPITOR Take 1 tablet (20 mg total) by mouth daily.   budesonide-formoterol 160-4.5 MCG/ACT inhaler Commonly known as:  SYMBICORT Inhale 2 puffs into the lungs 2 (two) times daily. What changed:  when to take this   feeding supplement (GLUCERNA SHAKE) Liqd Take 237 mLs by mouth 3 (three) times daily between meals. What changed:  when to take this   fluticasone 50 MCG/ACT nasal spray Commonly known as:  FLONASE Place 1 spray into both nostrils 2 (two) times daily.   HYDROcodone-acetaminophen 5-325 MG tablet Commonly known as:  NORCO/VICODIN Take 1 tablet by mouth every 4 (four) hours as needed for moderate pain. What changed:  when to take this  additional instructions   hydroxypropyl methylcellulose / hypromellose 2.5 % ophthalmic solution Commonly known as:  ISOPTO TEARS / GONIOVISC Place 1 drop into both eyes 3 (three) times daily as needed for dry eyes.   ipratropium-albuterol 0.5-2.5 (3) MG/3ML Soln Commonly known as:  DUONEB Take 3 mLs by nebulization 4 (four) times daily. What changed:  See the new instructions.   latanoprost 0.005 % ophthalmic solution Commonly known as:  XALATAN Place 1 drop into both eyes at bedtime.   MUCINEX DM MAXIMUM STRENGTH 60-1200  MG Tb12 Take 1 tablet by mouth every 12 (twelve) hours.   multivitamin with minerals tablet Take 1 tablet by mouth daily.   OXYGEN Inhale 2 L into the lungs continuous.   predniSONE 5 MG tablet Commonly known as:  DELTASONE Take 1 tablet (5 mg total) by mouth daily with breakfast. Start taking on:  03/05/2016   QUEtiapine 25 MG tablet Commonly known as:  SEROQUEL Take 0.5 tablets (12.5 mg total) by mouth at bedtime.  Allergies  Allergen Reactions  . Brovana [Arformoterol Tartrate] Shortness Of Breath and Cough    General intolerance  . Budesonide Shortness Of Breath and Cough    General intolerance  . Mucomyst [Acetylcysteine] Shortness Of Breath   Follow-up Information    RAMASWAMY,MURALI, MD. Schedule an appointment as soon as possible for a visit in 2 weeks.   Specialty:  Pulmonary Disease Why:  Schedule follow-up with Dr Marchelle Gearing as outpt in 2 weeks for acute on chronic respiratory failure with hypoxia, COPD Contact information: [address] [place] [address] [phone]        [name], MD. Schedule an appointment as soon as possible for a visit in 3 week(s).   Specialty:  Internal Medicine Why:  Schedule follow-up with Dr. [name] in 2-3 weeks HTN, chronic diastolic CHF, diabetes type 2 Contact information: [address] [place] [address] [phone]        Please follow up.   Why:  Office will call.           The results of significant diagnostics from this hospitalization (including imaging, microbiology, ancillary and laboratory) are listed below for reference.    Significant Diagnostic Studies: Ct Abdomen Pelvis Wo Contrast  Result Date: 02/28/2016 CLINICAL DATA:  Acute left upper quadrant abdominal pain. Shortness of breath. EXAM: CT CHEST, ABDOMEN AND PELVIS WITHOUT CONTRAST TECHNIQUE: Multidetector CT imaging of the chest, abdomen and pelvis was performed following the standard protocol without IV contrast.  COMPARISON:  Chest radiograph of same day. CT scan of March 24, 2015. FINDINGS: CT CHEST FINDINGS Cardiovascular: Atherosclerosis of thoracic aorta is noted without aneurysm formation. Coronary artery calcifications are noted. Normal cardiac size. Mediastinum/Nodes: No enlarged mediastinal, hilar, or axillary lymph nodes. Thyroid gland, trachea, and esophagus demonstrate no significant findings. Lungs/Pleura: No pneumothorax is noted. Airspace opacity is noted in lingular segment of left upper lobe and left lower lobe posteriorly most consistent with pneumonia. Small associated left pleural effusion is noted. Mild emphysematous disease is noted in the upper lobes bilaterally. Musculoskeletal: No chest wall mass or suspicious bone lesions identified. CT ABDOMEN PELVIS FINDINGS Hepatobiliary: No focal liver abnormality is seen. No gallstones, gallbladder wall thickening, or biliary dilatation. Pancreas: Unremarkable. No pancreatic ductal dilatation or surrounding inflammatory changes. Spleen: Normal in size without focal abnormality. Adrenals/Urinary Tract: Adrenal glands are unremarkable. No hydronephrosis or renal obstruction is noted. Multiple left renal cysts are noted. No renal or ureteral calculi are noted. Urinary bladder is unremarkable. Stomach/Bowel: There is no evidence of bowel obstruction. Stool is noted throughout the colon. Vascular/Lymphatic: Aortic atherosclerosis. No enlarged abdominal or pelvic lymph nodes. Reproductive: Status post hysterectomy. No adnexal masses. Other: No abdominal wall hernia or abnormality. No abdominopelvic ascites. Musculoskeletal: Multilevel degenerative disc disease is noted in the lower lumbar spine. Status post surgical posterior fusion of L4-5. IMPRESSION: Aortic atherosclerosis. Coronary artery calcifications are noted suggesting coronary artery disease. Airspace opacities are noted in lingular segment of left lower lobe as well as left lower lobe most consistent  with pneumonia with small associated left pleural effusion. Mild emphysematous disease is noted in the upper lobes bilaterally. Multilevel degenerative disc disease and postoperative changes are noted in the lower lumbar spine. Large left renal cysts are noted. No acute abnormality seen in the abdomen or pelvis. Electronically Signed   By: Lupita Raider, M.D.   On: 02/28/2016 10:17   Dg Chest 2 View  Result Date: 02/29/2016 CLINICAL DATA:  Sepsis EXAM: CHEST  2 VIEW COMPARISON:  February 28, 2016 FINDINGS: Persistent infiltrate in the lingula and left lower lobe, mildly worsened in the interval. No other changes. IMPRESSION: The lingular and left lower lobe infiltrate is mildly worsened in the interval. Electronically Signed   By: Gerome Sam III M.D   On: 02/29/2016 07:43   Ct Chest Wo Contrast  Result Date: 02/28/2016 CLINICAL DATA:  Acute left upper quadrant abdominal pain. Shortness of breath. EXAM: CT CHEST, ABDOMEN AND PELVIS WITHOUT CONTRAST TECHNIQUE: Multidetector CT imaging of the chest, abdomen and pelvis was performed following the standard protocol without IV contrast. COMPARISON:  Chest radiograph of same day. CT scan of March 24, 2015. FINDINGS: CT CHEST FINDINGS Cardiovascular: Atherosclerosis of thoracic aorta is noted without aneurysm formation. Coronary artery calcifications are noted. Normal cardiac size. Mediastinum/Nodes: No enlarged mediastinal, hilar, or axillary lymph nodes. Thyroid gland, trachea, and esophagus demonstrate no significant findings. Lungs/Pleura: No pneumothorax is noted. Airspace opacity is noted in lingular segment of left upper lobe and left lower lobe posteriorly most consistent with pneumonia. Small associated left pleural effusion is noted. Mild emphysematous disease is noted in the upper lobes bilaterally. Musculoskeletal: No chest wall mass or suspicious bone lesions identified. CT ABDOMEN PELVIS FINDINGS Hepatobiliary: No focal liver abnormality is  seen. No gallstones, gallbladder wall thickening, or biliary dilatation. Pancreas: Unremarkable. No pancreatic ductal dilatation or surrounding inflammatory changes. Spleen: Normal in size without focal abnormality. Adrenals/Urinary Tract: Adrenal glands are unremarkable. No hydronephrosis or renal obstruction is noted. Multiple left renal cysts are noted. No renal or ureteral calculi are noted. Urinary bladder is unremarkable. Stomach/Bowel: There is no evidence of bowel obstruction. Stool is noted throughout the colon. Vascular/Lymphatic: Aortic atherosclerosis. No enlarged abdominal or pelvic lymph nodes. Reproductive: Status post hysterectomy. No adnexal masses. Other: No abdominal wall hernia or abnormality. No abdominopelvic ascites. Musculoskeletal: Multilevel degenerative disc disease is noted in the lower lumbar spine. Status post surgical posterior fusion of L4-5. IMPRESSION: Aortic atherosclerosis. Coronary artery calcifications are noted suggesting coronary artery disease. Airspace opacities are noted in lingular segment of left lower lobe as well as left lower lobe most consistent with pneumonia with small associated left pleural effusion. Mild emphysematous disease is noted in the upper lobes bilaterally. Multilevel degenerative disc disease and postoperative changes are noted in the lower lumbar spine. Large left renal cysts are noted. No acute abnormality seen in the abdomen or pelvis. Electronically Signed   By: Lupita Raider, M.D.   On: 02/28/2016 10:17   Dg Chest Port 1 View  Result Date: 02/28/2016 CLINICAL DATA:  Shortness of breath today. EXAM: PORTABLE CHEST 1 VIEW COMPARISON:  Radiographs 11/24/2015.  CT 03/24/2015 FINDINGS: Left lung base opacity, present on prior exams but increased currently. Possible small left pleural effusion. Mucous plugging/ airway impaction seen on prior CT. Mild vascular congestion. There is bronchial thickening. Normal heart size and mediastinal contours.  Atherosclerosis of the aortic arch. No pneumothorax. No acute osseous abnormalities. IMPRESSION: Left lung base opacity, present on prior exams but increased currently. Mucous plugging/ airway impaction seen on prior CT. Findings suggest recurrence/progression of bronchial filling. This may be infectious or inflammatory. Aspiration is not excluded but felt less likely. Vascular congestion.  Mild bronchial thickening. Thoracic aortic atherosclerosis. Electronically Signed   By: Rubye Oaks M.D.   On: 02/28/2016 06:58    Microbiology: Recent Results (from the past 240 hour(s))  Culture, blood (routine x 2) Call MD if unable to obtain prior to antibiotics being given     Status:  None (Preliminary result)   Collection Time: 02/28/16  8:20 AM  Result Value Ref Range Status   Specimen Description BLOOD RIGHT ANTECUBITAL  Final   Special Requests IN PEDIATRIC BOTTLE  4CC  Final   Culture NO GROWTH 4 DAYS  Final   Report Status PENDING  Incomplete  Culture, blood (routine x 2) Call MD if unable to obtain prior to antibiotics being given     Status: None (Preliminary result)   Collection Time: 02/28/16  8:32 AM  Result Value Ref Range Status   Specimen Description BLOOD RIGHT HAND  Final   Special Requests IN PEDIATRIC BOTTLE  1CC  Final   Culture NO GROWTH 4 DAYS  Final   Report Status PENDING  Incomplete  Urine culture     Status: Abnormal   Collection Time: 02/28/16 12:59 PM  Result Value Ref Range Status   Specimen Description URINE, RANDOM  Final   Special Requests NONE  Final   Culture MULTIPLE SPECIES PRESENT, SUGGEST RECOLLECTION (A)  Final   Report Status 02/29/2016 FINAL  Final  MRSA PCR Screening     Status: None   Collection Time: 02/28/16  5:34 PM  Result Value Ref Range Status   MRSA by PCR NEGATIVE NEGATIVE Final    Comment:        The GeneXpert MRSA Assay (FDA approved for NASAL specimens only), is one component of a comprehensive MRSA colonization surveillance program.  It is not intended to diagnose MRSA infection nor to guide or monitor treatment for MRSA infections.   Culture, sputum-assessment     Status: None   Collection Time: 02/29/16  4:48 AM  Result Value Ref Range Status   Specimen Description SPUTUM  Final   Special Requests NONE  Final   Sputum evaluation   Final    Sputum specimen not acceptable for testing.  Please recollect.   Gram Stain Report Called to,Read Back By and Verified With: R. [facility] LL[facility] RN, AT [phone] 02/29/16 BY D. Leighton RoachVANHOOK    Report Status 02/29/2016 FINAL  Final     Labs: Basic Metabolic Panel:  Recent Labs Lab 02/28/16 [id number] 02/28/16 [id number] 02/29/16 0648 03/02/16 0348 03/03/16 0250  NA 144 142 143 146* 146*  K 4.2 4.0 4.3 3.7 3.8  CL 101 98* 101 102 101  CO2 32  --  32 34* 35*  GLUCOSE 183* 176* 107* 86 146*  BUN 24* 29* 16 11 13   CREATININE 0.96 1.00 0.72 0.64 0.76  CALCIUM 9.4  --  8.6* 9.2 8.9  MG  --   --   --   --  1.9   Liver Function Tests:  Recent Labs Lab 02/28/16 0647 02/29/16 0648  AST 22 17  ALT 17 14  ALKPHOS 61 50  BILITOT 0.6 0.4  PROT 6.3* 5.4*  ALBUMIN 3.2* 2.5*    Recent Labs Lab 02/28/16 0647  LIPASE 16   No results for input(s): AMMONIA in the last 168 hours. CBC:  Recent Labs Lab 02/28/16 0647 02/28/16 0653 02/29/16 0648 03/01/16 1232 03/02/16 0348 03/03/16 0250  WBC 24.6*  --  24.7* 21.1* 21.1* 16.4*  NEUTROABS 21.1*  --   --   --   --   --   HGB 8.3* 10.2* 7.6* 7.9* 8.6* 8.2*  HCT 30.4* 30.0* 26.3* 28.4* 30.4* 29.7*  MCV 84.0  --  81.2 80.9 81.3 80.5  PLT 274  --  223 269 235 302   Cardiac Enzymes: No results for input(s): CKTOTAL, CKMB,  CKMBINDEX, TROPONINI in the last 168 hours. BNP: BNP (last 3 results)  Recent Labs  10/17/15 1211 11/24/15 2145 02/28/16 0649  BNP 115.5* 66.2 155.0*    ProBNP (last 3 results) No results for input(s): PROBNP in the last 8760 hours.  CBG:  Recent Labs Lab 03/03/16 1104 03/03/16 1238 03/03/16 1639 03/03/16 2135  03/04/16 0804  GLUCAP 235* 160* 124* 185* 100*       Signed:  Carolyne Littles, MD [facility] [phone] pager

## 2016-03-04 NOTE — Clinical Social Work Placement (Signed)
   CLINICAL SOCIAL WORK PLACEMENT  NOTE  Date:  03/04/2016  Patient Details  Name: Nicole Maxwell MRN: [id number] Date of Birth: 1945/09/28  Clinical Social Work is seeking post-discharge placement for this patient at the Skilled  Nursing Facility level of care (*CSW will initial, date and re-position this form in  chart as items are completed):  Yes   Patient/family provided with [place] Clinical Social Work Department's list of facilities offering this level of care within the geographic area requested by the patient (or if unable, by the patient's family).  Yes   Patient/family informed of their freedom to choose among providers that offer the needed level of care, that participate in Medicare, Medicaid or managed care program needed by the patient, have an available bed and are willing to accept the patient.  Yes   Patient/family informed of 's ownership interest in [facility] and [facility], as well as of the fact that they are under no obligation to receive care at these facilities.  PASRR submitted to EDS on 03/03/16     PASRR number received on       Existing PASRR number confirmed on 03/03/16     FL2 transmitted to all facilities in geographic area requested by pt/family on 03/03/16     FL2 transmitted to all facilities within larger geographic area on       Patient informed that his/her managed care company has contracts with or will negotiate with certain facilities, including the following:        Yes   Patient/family informed of bed offers received.  [facility]     Physician recommends and patient chooses bed at      [facility].  Patient to be transferred to facility by [facility]     Patient family notified on 03/04/16 of transfer.  Name of family member notified:  Tobin ChadMaria Belin     PHYSICIAN Please prepare prescriptions     Additional Comment:     _______________________________________________ Margarito LinerSarah C Minta Fair, LCSW 03/04/2016, 2:41 PM

## 2016-03-04 NOTE — Progress Notes (Signed)
Daily Progress Note   Patient Name: Nicole Maxwell       Date: 03/04/2016 DOB: 01/07/1946  Age: 71 y.o. MRN#: [id number] Attending Physician: Lonia Blood, MD Primary Care Physician: [name], MD Admit Date: 02/28/2016  Reason for Consultation/Follow-up: Establishing GOCs 2/2 continued decline in setting of ES-COPD  Subjective:  -spoke with patient at bedside, she is calm today but unable to verbalize any understanding of her medical situation  -spoke again with daughter/Nicole Maxwell as f/u to [employer].  She understands the overall long term prognosis and the importance of advanced care planning.    - today we further clarified code status and while in the hospital desires ACLS medications but no CPR, compressions or intubation  -questions and concerns addressed  Length of Stay: 5  Current Medications: Scheduled Meds:  . ALPRAZolam  1 mg Oral QHS  . budesonide (PULMICORT) nebulizer solution  0.25 mg Nebulization BID  . enoxaparin (LOVENOX) injection  40 mg Subcutaneous Q24H  . feeding supplement (GLUCERNA SHAKE)  237 mL Oral TID BM  . fluticasone  1 spray Each Nare BID  . insulin aspart  0-5 Units Subcutaneous QHS  . insulin aspart  0-9 Units Subcutaneous TID WC  . ipratropium-albuterol  3 mL Nebulization QID  . latanoprost  1 drop Both Eyes QHS  . mouth rinse  15 mL Mouth Rinse BID  . piperacillin-tazobactam (ZOSYN)  IV  3.375 g Intravenous Q8H  . predniSONE  5 mg Oral Q breakfast  . QUEtiapine  12.5 mg Oral QHS  . sodium chloride flush  3 mL Intravenous Q12H    Continuous Infusions:   PRN Meds: acetaminophen **OR** acetaminophen, albuterol, ALPRAZolam, benzonatate, guaiFENesin, HYDROcodone-acetaminophen, hydroxypropyl methylcellulose / hypromellose,  ondansetron **OR** ondansetron (ZOFRAN) IV  Physical Exam          Vital Signs: BP 109/65 (BP Location: Right Arm)   Pulse 78   Temp 98.6 F (37 C) (Oral)   Resp 20   Ht 5\' 5"  (1.651 m)   Wt 55.3 kg (121 lb 14.6 oz)   SpO2 100%   BMI 20.29 kg/m  SpO2: SpO2: 100 % O2 Device: O2 Device: Nasal Cannula O2 Flow Rate: O2 Flow Rate (L/min): 3 L/min  Intake/output summary:  Intake/Output Summary (Last 24 hours) at 03/04/16 4098 Last data filed at  03/04/16 0100  Gross per 24 hour  Intake           1262.5 ml  Output              750 ml  Net            512.5 ml   LBM: Last BM Date: 03/01/16 Baseline Weight: Weight: 53.7 kg (118 lb 6.2 oz) Most recent weight: Weight: 55.3 kg (121 lb 14.6 oz)       Palliative Assessment/Data:  40%    Flowsheet Rows   Flowsheet Row Most Recent Value  Intake Tab  Referral Department  Hospitalist  Unit at Time of Referral  Med/Surg Unit  Palliative Care Primary Diagnosis  Pulmonary  Date Notified  03/01/16  Palliative Care Type  New Palliative care  Reason for referral  Clarify Goals of Care  Date of Admission  02/28/16  Date first seen by Palliative Care  03/02/16  # of days Palliative referral response time  1 Day(s)  # of days IP prior to Palliative referral  2  Clinical Assessment  Palliative Performance Scale Score  30%  Psychosocial & Spiritual Assessment  Palliative Care Outcomes      Patient Active Problem List   Diagnosis Date Noted  . DNR (do not resuscitate) discussion 03/02/2016  . Palliative care by specialist 03/02/2016  . Acute encephalopathy   . Aspiration pneumonia of left lower lobe due to vomit (HCC)   . Essential hypertension   . Chronic diastolic CHF (congestive heart failure) (HCC)   . Acute on chronic respiratory failure with hypoxia and hypercapnia (HCC) 02/28/2016  . Acute LUQ pain 02/28/2016  . Sepsis (HCC) 11/25/2015  . Tobacco use 06/30/2015  . Protein-calorie malnutrition, severe 03/23/2015  . Slow  transit constipation 01/07/2015  . Normocytic anemia 11/27/2014  . Physical deconditioning 07/01/2014  . Pulmonary infiltrate in left lung on chest x-ray 04/16/2014  . DM type 2 (diabetes mellitus, type 2) (HCC) 04/16/2014  . Vitamin D deficiency disease 04/16/2014  . Esophageal dysmotility 02/27/2014  . HCAP (healthcare-associated pneumonia) 02/21/2014  . Chronic diastolic congestive heart failure (HCC) 01/07/2014  . Acute on chronic respiratory failure (HCC) 01/03/2014  . COPD exacerbation (HCC) 12/09/2011  . HTN (hypertension) 05/12/2010  . Hyperlipidemia 05/12/2010  . Severe chronic obstructive pulmonary disease (HCC) 04/29/2010    Palliative Care Assessment & Plan   Patient Profile: 71 y.o. female   admitted on 02/28/2016  a PMH for  significant for chronic respiratory failure with hypoxia, COPD, chronic steroids, high blood pressure, dysphagia, asthma who presents with acute encephalopathy and respiratory distress. She is a pateint of [facility].  Admitted for  acute on chronic respiratory failure with hypoxia and hypercarbia. Per family she has been declining slowly both physically and functionally over the past several months  Assessment:  - patient is improving and family remains hopeful for continued improvement, but understands long term poor prognosis  Recommendations/Plan:  Ongoing adjustments to treatment plan and medical interventions dependant on outcomes   Code Status:    Code Status Orders        Start     Ordered   02/28/16 1657  Limited resuscitation (code)  Continuous    Question Answer Comment  In the event of cardiac or respiratory ARREST: Initiate Code Blue, Call Rapid Response Yes   In the event of cardiac or respiratory ARREST: Perform CPR Yes   In the event of cardiac or respiratory ARREST: Perform Intubation/Mechanical Ventilation No  In the event of cardiac or respiratory ARREST: Use NIPPV/BiPAp only if indicated Yes   In the event  of cardiac or respiratory ARREST: Administer ACLS medications if indicated Yes   In the event of cardiac or respiratory ARREST: Perform Defibrillation or Cardioversion if indicated Yes      02/28/16 1656    Code Status History    Date Active Date Inactive Code Status Order ID Comments User Context   02/28/2016  8:54 AM 02/28/2016  4:56 PM Partial Code [id number]  Russella Dar, NP ED   11/28/2015  2:22 PM 11/30/2015  5:53 PM Full Code [id number]  Leatha Gilding, MD Inpatient   11/26/2015  3:18 PM 11/28/2015  2:22 PM Partial Code [id number]  Leatha Gilding, MD Inpatient   11/25/2015  7:36 AM 11/26/2015  3:18 PM Full Code [id number]  Michael Litter, MD ED   11/25/2015  1:59 AM 11/25/2015  7:36 AM Partial Code [id number]  Duayne Cal, NP ED   10/17/2015  4:29 PM 10/24/2015  6:43 PM Partial Code [id number]  Meredith Pel, NP ED   09/10/2015  8:24 AM 09/16/2015  5:28 PM Full Code [id number]  Ozella Rocks, MD ED   06/30/2015  4:46 PM 07/06/2015  9:44 PM DNR [id number]  Gwenyth Bender, NP ED   06/30/2015  4:36 PM 06/30/2015  4:46 PM Full Code [id number]  Gwenyth Bender, NP ED   03/15/2015  5:53 PM 03/25/2015  6:28 PM Full Code [id number]  Oretha Milch, MD ED   11/26/2014 12:17 PM 12/12/2014  7:22 PM Full Code [id number]  Marinda Elk, MD Inpatient   04/16/2014  9:23 PM 04/18/2014  5:25 PM Full Code [id number]  Ron Parker, MD Inpatient   02/21/2014  6:47 PM 02/27/2014  2:24 PM Full Code [id number]  [name], MD Inpatient    Advance Directive Documentation   Flowsheet Row Most Recent Value  Type of Advance Directive  Healthcare Power of Attorney  Pre-existing out of facility DNR order (yellow form or pink MOST form)  No data  "MOST" Form in Place?  No data      Prognosis:   -less than 6 months  Discharge Planning:  Daughter is requesting SNF for rehab with [facility]--she is contemplating the need for long term care  Care plan was discussed with daughter and bedside nurse  Thank you for allowing the  Palliative Medicine Team to assist in the care of this patient.   Time In: 1520 Time Out: 1555 Total Time 35 min Prolonged Time Billed  no      Greater than 50%  of this time was spent counseling and coordinating care related to the above assessment and plan.  Lorinda Creed, NP  Please contact Palliative Medicine Team phone at [phone] for questions and concerns.

## 2016-03-19 ENCOUNTER — Encounter: Payer: Self-pay | Admitting: Adult Health

## 2016-03-19 ENCOUNTER — Ambulatory Visit (INDEPENDENT_AMBULATORY_CARE_PROVIDER_SITE_OTHER): Payer: Medicare Other | Admitting: Adult Health

## 2016-03-19 DIAGNOSIS — J449 Chronic obstructive pulmonary disease, unspecified: Secondary | ICD-10-CM | POA: Diagnosis not present

## 2016-03-19 DIAGNOSIS — J9622 Acute and chronic respiratory failure with hypercapnia: Secondary | ICD-10-CM

## 2016-03-19 DIAGNOSIS — J9621 Acute and chronic respiratory failure with hypoxia: Secondary | ICD-10-CM | POA: Diagnosis not present

## 2016-03-19 DIAGNOSIS — J189 Pneumonia, unspecified organism: Secondary | ICD-10-CM | POA: Diagnosis not present

## 2016-03-19 NOTE — Assessment & Plan Note (Addendum)
Recent flare with PNA Limit sedatng rx.  Plan   Chronic hypercarbic/hypoxic RF  Cont on O2  Restart BIPAP At bedtime  -encouarged on usage.

## 2016-03-19 NOTE — Progress Notes (Signed)
@Patient  ID: Nicole Maxwell, female    DOB: 1945/11/18, 71 y.o.   MRN: [id number]  Chief Complaint  Patient presents with  . Follow-up    COPD     Referring provider: [name], MD  HPI: 71 year old female former smoker followed for COPD and chronic hypercarbic and hypoxemic respiratory failure on oxygen 2 L . On BIPAP At bedtime    TEST  COPD - Oxygen dependent since 2011  - Doubtamine stress echo 05/12/10 - normal, CXR July 2011 - clear  - PFTs 04/13/2010 - shows Gold stage 4 COPD with BD response/asthma component (fev1 0.5L/29%, 10% BD response on fev1 and 38% on FC), DLCO 27%)  - Spirometry Jan 2013 - fev1 0.74L/37%, RAtop 44 - shows gold stage 3 copd  03/19/2016 Post Hospital follow up  Patient presents for a post hospital follow-up. Patient was admitted earlier this month with a COPD exacerbation, aspiration pneumonia and acute on chronic hypercarbic and hypoxemic respiratory failure. Patient was found down at her home. She was transported the emergency room and required initial BiPAP support . Patient was treated with IV antibiotics, nebulized, bronchodilators , oxygen and steroids She is at rehab center, taking PT.  She is feeling better but remains weak.w. Low energy. Cough and congestion are down.   She remains on 2 L of oxygen and BiPAP support at night . Says refuses to wear BIPAP At bedtime. Explained the importance of wearing this and dangers of hypercarbia.   Daughter is concerned with meds , says she is taking too many pain meds . We discussed that these meds need to be limited .   Allergies  Allergen Reactions  . Brovana [Arformoterol Tartrate] Shortness Of Breath and Cough    General intolerance  . Budesonide Shortness Of Breath and Cough    General intolerance  . Mucomyst [Acetylcysteine] Shortness Of Breath    Immunization History  Administered Date(s) Administered  . Influenza Split 10/26/2010, 10/08/2011  . Influenza, High Dose Seasonal PF 11/14/2014,  09/24/2015  . Influenza-Unspecified 11/25/2013  . PPD Test 12/01/2015  . Pneumococcal Polysaccharide-23 10/26/2010    Past Medical History:  Diagnosis Date  . Alcohol abuse, in remission quit 2004  . Anemia of chronic disease   . Anxiety    Xanax prn  . Arthritis    "my whole body"  . Asthma   . Chronic back pain    "goes from the lower part of my back on down my legs"  (04/17/2014)  . Chronic respiratory failure with hypercapnia (HCC)   . COPD (chronic obstructive pulmonary disease) (HCC)    cxr 04/03/2010 - hyperinflation  . COPD exacerbation (HCC)   . Dysphagia   . Esophageal dysmotility   . HCAP (healthcare-associated pneumonia)   . Hyperlipidemia   . Hypertension   . Leukocytosis   . Neuromuscular disorder (HCC)    lumbar disc  . On home oxygen therapy    "2L; suppose to be 24/7" (04/17/2014)  . Osteopenia   . Physical deconditioning   . Pneumonia    "3 times in the last month or so" (04/17/2014)  . Protein calorie malnutrition (HCC)   . PVD (peripheral vascular disease) (HCC)   . Shortness of breath    with anxiety  and activty  . Tobacco abuse    " I am addicted"   . Type II diabetes mellitus (HCC)   . Vitamin D deficiency disease    03/26/2010 - 9.9ng/mL. Started on replacemnt    Tobacco  History: History  Smoking Status  . Current Some Day Smoker  . Packs/day: 0.25  . Years: 56.00  . Types: Cigarettes  Smokeless Tobacco  . Never Used    Comment: 5 cigs per week/10.9.17   Ready to quit: Not Answered Counseling given: Not Answered   Outpatient Encounter Prescriptions as of 03/19/2016  Medication Sig  . acetaminophen (TYLENOL) 500 MG tablet Take 500 mg by mouth every 6 (six) hours as needed for mild pain.  Marland Kitchen. albuterol (PROAIR HFA) 108 (90 Base) MCG/ACT inhaler Inhale 2 puffs into the lungs every 6 (six) hours as needed.  Marland Kitchen. albuterol (PROVENTIL) (2.5 MG/3ML) 0.083% nebulizer solution Take 3 mLs (2.5 mg total) by nebulization every 4 (four) hours as needed  for wheezing or shortness of breath. (may take 1 every 4 hours as needed for wheezing/shortness of breath) DX: 496  . ALPRAZolam (XANAX) 0.5 MG tablet Take 1 tablet (0.5 mg total) by mouth 2 (two) times daily as needed for anxiety.  . ALPRAZolam (XANAX) 1 MG tablet Take 1 tablet (1 mg total) by mouth at bedtime.  Marland Kitchen. amoxicillin-clavulanate (AUGMENTIN) 875-125 MG tablet Take 1 tablet by mouth every 12 (twelve) hours.  Marland Kitchen. aspirin EC 81 MG EC tablet Take 1 tablet (81 mg total) by mouth daily.  Marland Kitchen. atorvastatin (LIPITOR) 20 MG tablet Take 1 tablet (20 mg total) by mouth daily.  . budesonide-formoterol (SYMBICORT) 160-4.5 MCG/ACT inhaler Inhale 2 puffs into the lungs 2 (two) times daily.  Marland Kitchen. Dextromethorphan-Guaifenesin (MUCINEX DM MAXIMUM STRENGTH) 60-1200 MG TB12 Take 1 tablet by mouth every 12 (twelve) hours.  . feeding supplement, GLUCERNA SHAKE, (GLUCERNA SHAKE) LIQD Take 237 mLs by mouth 3 (three) times daily between meals.  . fluticasone (FLONASE) 50 MCG/ACT nasal spray Place 1 spray into both nostrils 2 (two) times daily.  Marland Kitchen. HYDROcodone-acetaminophen (NORCO/VICODIN) 5-325 MG tablet Take 1 tablet by mouth every 4 (four) hours as needed for moderate pain.  . hydroxypropyl methylcellulose / hypromellose (ISOPTO TEARS / GONIOVISC) 2.5 % ophthalmic solution Place 1 drop into both eyes 3 (three) times daily as needed for dry eyes.  Marland Kitchen. ipratropium-albuterol (DUONEB) 0.5-2.5 (3) MG/3ML SOLN Take 3 mLs by nebulization 4 (four) times daily.  Marland Kitchen. latanoprost (XALATAN) 0.005 % ophthalmic solution Place 1 drop into both eyes at bedtime.  . Multiple Vitamins-Minerals (MULTIVITAMIN WITH MINERALS) tablet Take 1 tablet by mouth daily.  . OXYGEN Inhale 2 L into the lungs continuous.  . predniSONE (DELTASONE) 5 MG tablet Take 1 tablet (5 mg total) by mouth daily with breakfast.  . QUEtiapine (SEROQUEL) 25 MG tablet Take 0.5 tablets (12.5 mg total) by mouth at bedtime.   No facility-administered encounter medications on  file as of 03/19/2016.      Review of Systems  Constitutional:   No  weight loss, night sweats,  Fevers, chills,  +fatigue, or  lassitude.  HEENT:   No headaches,  Difficulty swallowing,  Tooth/dental problems, or  Sore throat,                No sneezing, itching, ear ache, nasal congestion, post nasal drip,   CV:  No chest pain,  Orthopnea, PND, swelling in lower extremities, anasarca, dizziness, palpitations, syncope.   GI  No heartburn, indigestion, abdominal pain, nausea, vomiting, diarrhea, change in bowel habits, loss of appetite, bloody stools.   Resp:    No chest wall deformity  Skin: no rash or lesions.  GU: no dysuria, change in color of urine, no urgency or frequency.  No flank pain, no hematuria   MS:  No joint pain or swelling.  No decreased range of motion.  No back pain.    Physical Exam  BP 136/68 (BP Location: Right Arm, Cuff Size: Normal)   Pulse 84   Ht 5\' 4"  (1.626 m)   Wt 126 lb 12.8 oz (57.5 kg)   SpO2 98%   BMI 21.77 kg/m   GEN: A/Ox3; pleasant , NAD, elderly and frail on O2    HEENT:  [place]/AT,  EACs-clear, TMs-wnl, NOSE-clear, THROAT-clear, no lesions, no postnasal drip or exudate noted.   NECK:  Supple w/ fair ROM; no JVD; normal carotid impulses w/o bruits; no thyromegaly or nodules palpated; no lymphadenopathy.    RESP: Decreased BS in bases  No  accessory muscle use, no dullness to percussion  CARD:  RRR, no m/r/g,tr to 1  peripheral edema, pulses intact, no cyanosis or clubbing.  GI:   Soft & nt; nml bowel sounds; no organomegaly or masses detected.   Musco: Warm bil, no deformities or joint swelling noted.   Neuro: alert, no focal deficits noted.    Skin: Warm, no lesions or rashes      Imaging: Ct Abdomen Pelvis Wo Contrast  Result Date: 02/28/2016 CLINICAL DATA:  Acute left upper quadrant abdominal pain. Shortness of breath. EXAM: CT CHEST, ABDOMEN AND PELVIS WITHOUT CONTRAST TECHNIQUE: Multidetector CT imaging of the chest,  abdomen and pelvis was performed following the standard protocol without IV contrast. COMPARISON:  Chest radiograph of same day. CT scan of March 24, 2015. FINDINGS: CT CHEST FINDINGS Cardiovascular: Atherosclerosis of thoracic aorta is noted without aneurysm formation. Coronary artery calcifications are noted. Normal cardiac size. Mediastinum/Nodes: No enlarged mediastinal, hilar, or axillary lymph nodes. Thyroid gland, trachea, and esophagus demonstrate no significant findings. Lungs/Pleura: No pneumothorax is noted. Airspace opacity is noted in lingular segment of left upper lobe and left lower lobe posteriorly most consistent with pneumonia. Small associated left pleural effusion is noted. Mild emphysematous disease is noted in the upper lobes bilaterally. Musculoskeletal: No chest wall mass or suspicious bone lesions identified. CT ABDOMEN PELVIS FINDINGS Hepatobiliary: No focal liver abnormality is seen. No gallstones, gallbladder wall thickening, or biliary dilatation. Pancreas: Unremarkable. No pancreatic ductal dilatation or surrounding inflammatory changes. Spleen: Normal in size without focal abnormality. Adrenals/Urinary Tract: Adrenal glands are unremarkable. No hydronephrosis or renal obstruction is noted. Multiple left renal cysts are noted. No renal or ureteral calculi are noted. Urinary bladder is unremarkable. Stomach/Bowel: There is no evidence of bowel obstruction. Stool is noted throughout the colon. Vascular/Lymphatic: Aortic atherosclerosis. No enlarged abdominal or pelvic lymph nodes. Reproductive: Status post hysterectomy. No adnexal masses. Other: No abdominal wall hernia or abnormality. No abdominopelvic ascites. Musculoskeletal: Multilevel degenerative disc disease is noted in the lower lumbar spine. Status post surgical posterior fusion of L4-5. IMPRESSION: Aortic atherosclerosis. Coronary artery calcifications are noted suggesting coronary artery disease. Airspace opacities are noted  in lingular segment of left lower lobe as well as left lower lobe most consistent with pneumonia with small associated left pleural effusion. Mild emphysematous disease is noted in the upper lobes bilaterally. Multilevel degenerative disc disease and postoperative changes are noted in the lower lumbar spine. Large left renal cysts are noted. No acute abnormality seen in the abdomen or pelvis. Electronically Signed   By: Lupita Raider, M.D.   On: 02/28/2016 10:17   Dg Chest 2 View  Result Date: 02/29/2016 CLINICAL DATA:  Sepsis EXAM: CHEST  2 VIEW COMPARISON:  February 28, 2016 FINDINGS: Persistent infiltrate in the lingula and left lower lobe, mildly worsened in the interval. No other changes. IMPRESSION: The lingular and left lower lobe infiltrate is mildly worsened in the interval. Electronically Signed   By: Gerome Sam III M.D   On: 02/29/2016 07:43   Ct Chest Wo Contrast  Result Date: 02/28/2016 CLINICAL DATA:  Acute left upper quadrant abdominal pain. Shortness of breath. EXAM: CT CHEST, ABDOMEN AND PELVIS WITHOUT CONTRAST TECHNIQUE: Multidetector CT imaging of the chest, abdomen and pelvis was performed following the standard protocol without IV contrast. COMPARISON:  Chest radiograph of same day. CT scan of March 24, 2015. FINDINGS: CT CHEST FINDINGS Cardiovascular: Atherosclerosis of thoracic aorta is noted without aneurysm formation. Coronary artery calcifications are noted. Normal cardiac size. Mediastinum/Nodes: No enlarged mediastinal, hilar, or axillary lymph nodes. Thyroid gland, trachea, and esophagus demonstrate no significant findings. Lungs/Pleura: No pneumothorax is noted. Airspace opacity is noted in lingular segment of left upper lobe and left lower lobe posteriorly most consistent with pneumonia. Small associated left pleural effusion is noted. Mild emphysematous disease is noted in the upper lobes bilaterally. Musculoskeletal: No chest wall mass or suspicious bone lesions  identified. CT ABDOMEN PELVIS FINDINGS Hepatobiliary: No focal liver abnormality is seen. No gallstones, gallbladder wall thickening, or biliary dilatation. Pancreas: Unremarkable. No pancreatic ductal dilatation or surrounding inflammatory changes. Spleen: Normal in size without focal abnormality. Adrenals/Urinary Tract: Adrenal glands are unremarkable. No hydronephrosis or renal obstruction is noted. Multiple left renal cysts are noted. No renal or ureteral calculi are noted. Urinary bladder is unremarkable. Stomach/Bowel: There is no evidence of bowel obstruction. Stool is noted throughout the colon. Vascular/Lymphatic: Aortic atherosclerosis. No enlarged abdominal or pelvic lymph nodes. Reproductive: Status post hysterectomy. No adnexal masses. Other: No abdominal wall hernia or abnormality. No abdominopelvic ascites. Musculoskeletal: Multilevel degenerative disc disease is noted in the lower lumbar spine. Status post surgical posterior fusion of L4-5. IMPRESSION: Aortic atherosclerosis. Coronary artery calcifications are noted suggesting coronary artery disease. Airspace opacities are noted in lingular segment of left lower lobe as well as left lower lobe most consistent with pneumonia with small associated left pleural effusion. Mild emphysematous disease is noted in the upper lobes bilaterally. Multilevel degenerative disc disease and postoperative changes are noted in the lower lumbar spine. Large left renal cysts are noted. No acute abnormality seen in the abdomen or pelvis. Electronically Signed   By: Lupita Raider, M.D.   On: 02/28/2016 10:17   Dg Chest Port 1 View  Result Date: 02/28/2016 CLINICAL DATA:  Shortness of breath today. EXAM: PORTABLE CHEST 1 VIEW COMPARISON:  Radiographs 11/24/2015.  CT 03/24/2015 FINDINGS: Left lung base opacity, present on prior exams but increased currently. Possible small left pleural effusion. Mucous plugging/ airway impaction seen on prior CT. Mild vascular  congestion. There is bronchial thickening. Normal heart size and mediastinal contours. Atherosclerosis of the aortic arch. No pneumothorax. No acute osseous abnormalities. IMPRESSION: Left lung base opacity, present on prior exams but increased currently. Mucous plugging/ airway impaction seen on prior CT. Findings suggest recurrence/progression of bronchial filling. This may be infectious or inflammatory. Aspiration is not excluded but felt less likely. Vascular congestion.  Mild bronchial thickening. Thoracic aortic atherosclerosis. Electronically Signed   By: Rubye Oaks M.D.   On: 02/28/2016 06:58     Assessment & Plan:   Severe chronic obstructive pulmonary disease (HCC) Recent exacerbation now resolving  encouarged on smoking cessation   Plan  Patient Instructions  Limit sedating  medications as able.  Continue on Symbicort 2 puffs.Twice daily   Continue on Duoneb Four times a day   Continue on Oxygen 2l/m .  Continue on BIPAP At bedtime  .  Great job on not smoking .  Follow up Dr. Marchelle Gearing 6 weeks and As needed   Please contact office for sooner follow up if symptoms do not improve or worsen or seek emergency care      HCAP (healthcare-associated pneumonia) Clinically improving from recent HCAP/ aspiration PNA  Encouraged to limit sedatng mediations  Check CXR on return in 4-6 weeks    Plan  Patient Instructions  Limit sedating medications as able.  Continue on Symbicort 2 puffs.Twice daily   Continue on Duoneb Four times a day   Continue on Oxygen 2l/m .  Continue on BIPAP At bedtime  .  Great job on not smoking .  Follow up Dr. Marchelle Gearing 6 weeks and As needed   Please contact office for sooner follow up if symptoms do not improve or worsen or seek emergency care      Acute on chronic respiratory failure (HCC) Recent flare with PNA Limit sedatng rx.  Plan   Chronic hypercarbic/hypoxic RF  Cont on O2  Restart BIPAP At bedtime  -encouarged on usage.       Rubye Oaks, NP 03/19/2016

## 2016-03-19 NOTE — Patient Instructions (Addendum)
Limit sedating medications as able.  Continue on Symbicort 2 puffs.Twice daily   Continue on Duoneb Four times a day   Continue on Oxygen 2l/m .  Continue on BIPAP At bedtime  .  Great job on not smoking .  Follow up Dr. Marchelle Gearingamaswamy 6 weeks and As needed w/ chest xray  Please contact office for sooner follow up if symptoms do not improve or worsen or seek emergency care

## 2016-03-19 NOTE — Assessment & Plan Note (Addendum)
Clinically improving from recent HCAP/ aspiration PNA  Encouraged to limit sedatng mediations  Check CXR on return in 4-6 weeks    Plan  Patient Instructions  Limit sedating medications as able.  Continue on Symbicort 2 puffs.Twice daily   Continue on Duoneb Four times a day   Continue on Oxygen 2l/m .  Continue on BIPAP At bedtime  .  Great job on not smoking .  Follow up Dr. Marchelle Gearingamaswamy 6 weeks and As needed   Please contact office for sooner follow up if symptoms do not improve or worsen or seek emergency care

## 2016-03-19 NOTE — Assessment & Plan Note (Signed)
Recent exacerbation now resolving  encouarged on smoking cessation   Plan  Patient Instructions  Limit sedating medications as able.  Continue on Symbicort 2 puffs.Twice daily   Continue on Duoneb Four times a day   Continue on Oxygen 2l/m .  Continue on BIPAP At bedtime  .  Great job on not smoking .  Follow up Dr. Marchelle Gearingamaswamy 6 weeks and As needed   Please contact office for sooner follow up if symptoms do not improve or worsen or seek emergency care

## 2016-04-11 ENCOUNTER — Inpatient Hospital Stay (HOSPITAL_COMMUNITY)

## 2016-04-11 ENCOUNTER — Inpatient Hospital Stay (HOSPITAL_COMMUNITY)
Admission: EM | Admit: 2016-04-11 | Discharge: 2016-04-14 | DRG: 190 | Disposition: A | Discharge status: Skilled Nursing Facility | Attending: Family Medicine | Admitting: Family Medicine

## 2016-04-11 ENCOUNTER — Encounter (HOSPITAL_COMMUNITY): Payer: Self-pay | Admitting: Nurse Practitioner

## 2016-04-11 ENCOUNTER — Emergency Department (HOSPITAL_COMMUNITY)

## 2016-04-11 DIAGNOSIS — M8580 Other specified disorders of bone density and structure, unspecified site: Secondary | ICD-10-CM | POA: Diagnosis present

## 2016-04-11 DIAGNOSIS — J9621 Acute and chronic respiratory failure with hypoxia: Secondary | ICD-10-CM | POA: Diagnosis present

## 2016-04-11 DIAGNOSIS — Z7952 Long term (current) use of systemic steroids: Secondary | ICD-10-CM | POA: Diagnosis not present

## 2016-04-11 DIAGNOSIS — D509 Iron deficiency anemia, unspecified: Secondary | ICD-10-CM | POA: Diagnosis present

## 2016-04-11 DIAGNOSIS — E1122 Type 2 diabetes mellitus with diabetic chronic kidney disease: Secondary | ICD-10-CM | POA: Diagnosis not present

## 2016-04-11 DIAGNOSIS — F419 Anxiety disorder, unspecified: Secondary | ICD-10-CM | POA: Diagnosis present

## 2016-04-11 DIAGNOSIS — M545 Low back pain: Secondary | ICD-10-CM | POA: Diagnosis present

## 2016-04-11 DIAGNOSIS — G8929 Other chronic pain: Secondary | ICD-10-CM | POA: Diagnosis present

## 2016-04-11 DIAGNOSIS — I110 Hypertensive heart disease with heart failure: Secondary | ICD-10-CM | POA: Diagnosis present

## 2016-04-11 DIAGNOSIS — Z9841 Cataract extraction status, right eye: Secondary | ICD-10-CM

## 2016-04-11 DIAGNOSIS — W19XXXA Unspecified fall, initial encounter: Secondary | ICD-10-CM

## 2016-04-11 DIAGNOSIS — Z961 Presence of intraocular lens: Secondary | ICD-10-CM | POA: Diagnosis present

## 2016-04-11 DIAGNOSIS — Z7951 Long term (current) use of inhaled steroids: Secondary | ICD-10-CM | POA: Diagnosis not present

## 2016-04-11 DIAGNOSIS — E1151 Type 2 diabetes mellitus with diabetic peripheral angiopathy without gangrene: Secondary | ICD-10-CM | POA: Diagnosis present

## 2016-04-11 DIAGNOSIS — Z8249 Family history of ischemic heart disease and other diseases of the circulatory system: Secondary | ICD-10-CM

## 2016-04-11 DIAGNOSIS — Z803 Family history of malignant neoplasm of breast: Secondary | ICD-10-CM | POA: Diagnosis not present

## 2016-04-11 DIAGNOSIS — G9340 Encephalopathy, unspecified: Secondary | ICD-10-CM | POA: Diagnosis not present

## 2016-04-11 DIAGNOSIS — Z7982 Long term (current) use of aspirin: Secondary | ICD-10-CM | POA: Diagnosis not present

## 2016-04-11 DIAGNOSIS — F17210 Nicotine dependence, cigarettes, uncomplicated: Secondary | ICD-10-CM | POA: Diagnosis present

## 2016-04-11 DIAGNOSIS — F329 Major depressive disorder, single episode, unspecified: Secondary | ICD-10-CM | POA: Diagnosis present

## 2016-04-11 DIAGNOSIS — T380X5A Adverse effect of glucocorticoids and synthetic analogues, initial encounter: Secondary | ICD-10-CM | POA: Diagnosis present

## 2016-04-11 DIAGNOSIS — D72829 Elevated white blood cell count, unspecified: Secondary | ICD-10-CM

## 2016-04-11 DIAGNOSIS — E785 Hyperlipidemia, unspecified: Secondary | ICD-10-CM | POA: Diagnosis present

## 2016-04-11 DIAGNOSIS — Z9842 Cataract extraction status, left eye: Secondary | ICD-10-CM | POA: Diagnosis not present

## 2016-04-11 DIAGNOSIS — R799 Abnormal finding of blood chemistry, unspecified: Secondary | ICD-10-CM

## 2016-04-11 DIAGNOSIS — R400 Somnolence: Secondary | ICD-10-CM | POA: Diagnosis present

## 2016-04-11 DIAGNOSIS — E1165 Type 2 diabetes mellitus with hyperglycemia: Secondary | ICD-10-CM | POA: Diagnosis present

## 2016-04-11 DIAGNOSIS — Z981 Arthrodesis status: Secondary | ICD-10-CM | POA: Diagnosis not present

## 2016-04-11 DIAGNOSIS — R296 Repeated falls: Secondary | ICD-10-CM | POA: Diagnosis present

## 2016-04-11 DIAGNOSIS — D649 Anemia, unspecified: Secondary | ICD-10-CM | POA: Diagnosis present

## 2016-04-11 DIAGNOSIS — I5032 Chronic diastolic (congestive) heart failure: Secondary | ICD-10-CM | POA: Diagnosis not present

## 2016-04-11 DIAGNOSIS — Z9981 Dependence on supplemental oxygen: Secondary | ICD-10-CM

## 2016-04-11 DIAGNOSIS — E119 Type 2 diabetes mellitus without complications: Secondary | ICD-10-CM

## 2016-04-11 DIAGNOSIS — Y929 Unspecified place or not applicable: Secondary | ICD-10-CM

## 2016-04-11 DIAGNOSIS — Z79899 Other long term (current) drug therapy: Secondary | ICD-10-CM | POA: Diagnosis not present

## 2016-04-11 DIAGNOSIS — N183 Chronic kidney disease, stage 3 (moderate): Secondary | ICD-10-CM | POA: Diagnosis not present

## 2016-04-11 DIAGNOSIS — D500 Iron deficiency anemia secondary to blood loss (chronic): Secondary | ICD-10-CM | POA: Diagnosis not present

## 2016-04-11 DIAGNOSIS — E559 Vitamin D deficiency, unspecified: Secondary | ICD-10-CM | POA: Diagnosis present

## 2016-04-11 DIAGNOSIS — J441 Chronic obstructive pulmonary disease with (acute) exacerbation: Secondary | ICD-10-CM | POA: Diagnosis present

## 2016-04-11 DIAGNOSIS — J9622 Acute and chronic respiratory failure with hypercapnia: Secondary | ICD-10-CM | POA: Diagnosis present

## 2016-04-11 DIAGNOSIS — I10 Essential (primary) hypertension: Secondary | ICD-10-CM | POA: Diagnosis not present

## 2016-04-11 DIAGNOSIS — Z888 Allergy status to other drugs, medicaments and biological substances status: Secondary | ICD-10-CM | POA: Diagnosis not present

## 2016-04-11 DIAGNOSIS — Z9119 Patient's noncompliance with other medical treatment and regimen: Secondary | ICD-10-CM

## 2016-04-11 DIAGNOSIS — D638 Anemia in other chronic diseases classified elsewhere: Secondary | ICD-10-CM | POA: Diagnosis present

## 2016-04-11 DIAGNOSIS — J449 Chronic obstructive pulmonary disease, unspecified: Secondary | ICD-10-CM | POA: Diagnosis not present

## 2016-04-11 LAB — VITAMIN B12: Vitamin B-12: 526 pg/mL (ref 180–914)

## 2016-04-11 LAB — CBC WITH DIFFERENTIAL/PLATELET
BASOS PCT: 0 %
Basophils Absolute: 0 10*3/uL (ref 0.0–0.1)
EOS PCT: 0 %
Eosinophils Absolute: 0 10*3/uL (ref 0.0–0.7)
HCT: 24.5 % — ABNORMAL LOW (ref 36.0–46.0)
Hemoglobin: 6.9 g/dL — CL (ref 12.0–15.0)
LYMPHS ABS: 2.2 10*3/uL (ref 0.7–4.0)
Lymphocytes Relative: 8 %
MCH: 22.4 pg — ABNORMAL LOW (ref 26.0–34.0)
MCHC: 28.2 g/dL — ABNORMAL LOW (ref 30.0–36.0)
MCV: 79.5 fL (ref 78.0–100.0)
MONO ABS: 1.4 10*3/uL — AB (ref 0.1–1.0)
Monocytes Relative: 5 %
Neutro Abs: 23.5 10*3/uL — ABNORMAL HIGH (ref 1.7–7.7)
Neutrophils Relative %: 87 %
PLATELETS: 412 10*3/uL — AB (ref 150–400)
RBC: 3.08 MIL/uL — ABNORMAL LOW (ref 3.87–5.11)
RDW: 19.4 % — ABNORMAL HIGH (ref 11.5–15.5)
WBC: 27.1 10*3/uL — ABNORMAL HIGH (ref 4.0–10.5)

## 2016-04-11 LAB — BLOOD GAS, ARTERIAL
ACID-BASE EXCESS: 3.6 mmol/L — AB (ref 0.0–2.0)
Acid-Base Excess: 3.9 mmol/L — ABNORMAL HIGH (ref 0.0–2.0)
Bicarbonate: 31.7 mmol/L — ABNORMAL HIGH (ref 20.0–28.0)
Bicarbonate: 30.4 mmol/L — ABNORMAL HIGH (ref 20.0–28.0)
DELIVERY SYSTEMS: POSITIVE
DRAWN BY: 308601
Drawn by: 308601
EXPIRATORY PAP: 5
FIO2: 30
Inspiratory PAP: 20
MODE: POSITIVE
O2 CONTENT: 2 L/min
O2 SAT: 93.5 %
O2 Saturation: 93.2 %
PATIENT TEMPERATURE: 98.6
PH ART: 7.275 — AB (ref 7.350–7.450)
PO2 ART: 79 mmHg — AB (ref 83.0–108.0)
Patient temperature: 98.6
pCO2 arterial: 79.8 mmHg (ref 32.0–48.0)
pCO2 arterial: 67.5 mmHg (ref 32.0–48.0)
pH, Arterial: 7.223 — ABNORMAL LOW (ref 7.350–7.450)
pO2, Arterial: 77.1 mmHg — ABNORMAL LOW (ref 83.0–108.0)

## 2016-04-11 LAB — CBG MONITORING, ED: Glucose-Capillary: 135 mg/dL — ABNORMAL HIGH (ref 65–99)

## 2016-04-11 LAB — BASIC METABOLIC PANEL
Anion gap: 5 (ref 5–15)
BUN: 36 mg/dL — AB (ref 6–20)
CHLORIDE: 105 mmol/L (ref 101–111)
CO2: 33 mmol/L — AB (ref 22–32)
CREATININE: 0.93 mg/dL (ref 0.44–1.00)
Calcium: 8.6 mg/dL — ABNORMAL LOW (ref 8.9–10.3)
GFR calc Af Amer: >60 mL/min (ref >60)
GFR calc non Af Amer: >60 mL/min (ref >60)
Glucose, Bld: 180 mg/dL — ABNORMAL HIGH (ref 65–99)
Potassium: 4.8 mmol/L (ref 3.5–5.1)
SODIUM: 143 mmol/L (ref 135–145)

## 2016-04-11 LAB — I-STAT TROPONIN, ED: TROPONIN I, POC: 0 ng/mL (ref 0.00–0.08)

## 2016-04-11 LAB — GLUCOSE, CAPILLARY
GLUCOSE-CAPILLARY: 131 mg/dL — AB (ref 65–99)
Glucose-Capillary: 142 mg/dL — ABNORMAL HIGH (ref 65–99)
Glucose-Capillary: 166 mg/dL — ABNORMAL HIGH (ref 65–99)
Glucose-Capillary: 182 mg/dL — ABNORMAL HIGH (ref 65–99)
Glucose-Capillary: 228 mg/dL — ABNORMAL HIGH (ref 65–99)

## 2016-04-11 LAB — RETICULOCYTES
RBC.: 2.87 MIL/uL — AB (ref 3.87–5.11)
RETIC CT PCT: 1.2 % (ref 0.4–3.1)
Retic Count, Absolute: 34.4 10*3/uL (ref 19.0–186.0)

## 2016-04-11 LAB — IRON AND TIBC
Iron: 6 ug/dL — ABNORMAL LOW (ref 28–170)
SATURATION RATIOS: 2 % — AB (ref 10.4–31.8)
TIBC: 358 ug/dL (ref 250–450)
UIBC: 352 ug/dL

## 2016-04-11 LAB — MRSA PCR SCREENING: MRSA by PCR: NEGATIVE

## 2016-04-11 LAB — FERRITIN: FERRITIN: 16 ng/mL (ref 11–307)

## 2016-04-11 LAB — FOLATE: Folate: 20.1 ng/mL (ref >5.9)

## 2016-04-11 LAB — BRAIN NATRIURETIC PEPTIDE: B NATRIURETIC PEPTIDE 5: 175.2 pg/mL — AB (ref 0.0–100.0)

## 2016-04-11 LAB — ABO/RH: ABO/RH(D): A POS

## 2016-04-11 LAB — PREPARE RBC (CROSSMATCH)

## 2016-04-11 LAB — POC OCCULT BLOOD, ED: FECAL OCCULT BLD: NEGATIVE

## 2016-04-11 MED ORDER — LORAZEPAM 2 MG/ML IJ SOLN
0.5000 mg | Freq: Four times a day (QID) | INTRAMUSCULAR | Status: DC | PRN
  Administered 2016-04-12: 0.5 mg via INTRAVENOUS
  Filled 2016-04-11: qty 1

## 2016-04-11 MED ORDER — FLUTICASONE PROPIONATE 50 MCG/ACT NA SUSP
1.0000 | Freq: Two times a day (BID) | NASAL | Status: DC
  Administered 2016-04-11 – 2016-04-14 (×6): 1 via NASAL
  Filled 2016-04-11: qty 16

## 2016-04-11 MED ORDER — SODIUM CHLORIDE 0.9 % IV SOLN
10.0000 mL/h | Freq: Once | INTRAVENOUS | Status: AC
  Administered 2016-04-11: 10 mL/h via INTRAVENOUS

## 2016-04-11 MED ORDER — MOMETASONE FURO-FORMOTEROL FUM 200-5 MCG/ACT IN AERO
2.0000 | INHALATION_SPRAY | Freq: Two times a day (BID) | RESPIRATORY_TRACT | Status: DC
  Administered 2016-04-11 – 2016-04-14 (×8): 2 via RESPIRATORY_TRACT
  Filled 2016-04-11: qty 8.8

## 2016-04-11 MED ORDER — SODIUM CHLORIDE 0.9% FLUSH
3.0000 mL | Freq: Two times a day (BID) | INTRAVENOUS | Status: DC
  Administered 2016-04-11 – 2016-04-13 (×6): 3 mL via INTRAVENOUS

## 2016-04-11 MED ORDER — FENTANYL CITRATE (PF) 100 MCG/2ML IJ SOLN
12.5000 ug | INTRAMUSCULAR | Status: DC | PRN
  Administered 2016-04-11 (×3): 12.5 ug via INTRAVENOUS
  Filled 2016-04-11 (×3): qty 2

## 2016-04-11 MED ORDER — ALPRAZOLAM 0.5 MG PO TABS
0.5000 mg | ORAL_TABLET | Freq: Every evening | ORAL | Status: DC | PRN
Start: 2016-04-11 — End: 2016-04-12
  Administered 2016-04-11: 0.5 mg via ORAL

## 2016-04-11 MED ORDER — DM-GUAIFENESIN ER 30-600 MG PO TB12
1.0000 | ORAL_TABLET | Freq: Every day | ORAL | Status: DC
Start: 2016-04-11 — End: 2016-04-15
  Administered 2016-04-12 – 2016-04-14 (×3): 1 via ORAL
  Filled 2016-04-11 (×4): qty 1

## 2016-04-11 MED ORDER — QUETIAPINE FUMARATE 25 MG PO TABS
12.5000 mg | ORAL_TABLET | Freq: Every day | ORAL | Status: DC
  Administered 2016-04-11 – 2016-04-13 (×3): 12.5 mg via ORAL
  Filled 2016-04-11 (×3): qty 1

## 2016-04-11 MED ORDER — HYDRALAZINE HCL 20 MG/ML IJ SOLN
10.0000 mg | Freq: Once | INTRAMUSCULAR | Status: AC
  Administered 2016-04-11: 10 mg via INTRAVENOUS
  Filled 2016-04-11: qty 1

## 2016-04-11 MED ORDER — INSULIN ASPART 100 UNIT/ML ~~LOC~~ SOLN
0.0000 [IU] | SUBCUTANEOUS | Status: DC
  Administered 2016-04-11 (×2): 2 [IU] via SUBCUTANEOUS
  Administered 2016-04-11: 3 [IU] via SUBCUTANEOUS
  Administered 2016-04-11 (×2): 1 [IU] via SUBCUTANEOUS
  Administered 2016-04-12: 2 [IU] via SUBCUTANEOUS
  Administered 2016-04-12 (×2): 1 [IU] via SUBCUTANEOUS
  Filled 2016-04-11: qty 1

## 2016-04-11 MED ORDER — IPRATROPIUM-ALBUTEROL 0.5-2.5 (3) MG/3ML IN SOLN
3.0000 mL | RESPIRATORY_TRACT | Status: DC | PRN
Start: 2016-04-11 — End: 2016-04-15
  Administered 2016-04-11: 3 mL via RESPIRATORY_TRACT

## 2016-04-11 MED ORDER — LATANOPROST 0.005 % OP SOLN
1.0000 [drp] | Freq: Every day | OPHTHALMIC | Status: DC
  Administered 2016-04-11 – 2016-04-13 (×3): 1 [drp] via OPHTHALMIC
  Filled 2016-04-11: qty 2.5

## 2016-04-11 MED ORDER — POLYETHYLENE GLYCOL 3350 17 G PO PACK: 17.0000 g | PACK | Freq: Every day | ORAL | Status: DC | PRN

## 2016-04-11 MED ORDER — GABAPENTIN 300 MG PO CAPS
300.0000 mg | ORAL_CAPSULE | Freq: Two times a day (BID) | ORAL | Status: DC
  Administered 2016-04-11 – 2016-04-14 (×6): 300 mg via ORAL
  Filled 2016-04-11 (×6): qty 1

## 2016-04-11 MED ORDER — METHYLPREDNISOLONE SODIUM SUCC 125 MG IJ SOLR
60.0000 mg | Freq: Four times a day (QID) | INTRAMUSCULAR | Status: DC
  Administered 2016-04-11 – 2016-04-12 (×5): 60 mg via INTRAVENOUS
  Filled 2016-04-11 (×5): qty 2

## 2016-04-11 MED ORDER — ONDANSETRON HCL 4 MG/2ML IJ SOLN: 4.0000 mg | Freq: Four times a day (QID) | INTRAMUSCULAR | Status: DC | PRN

## 2016-04-11 MED ORDER — POLYVINYL ALCOHOL 1.4 % OP SOLN
1.0000 [drp] | Freq: Three times a day (TID) | OPHTHALMIC | Status: DC | PRN
  Filled 2016-04-11: qty 15

## 2016-04-11 MED ORDER — ACETAMINOPHEN 325 MG PO TABS
650.0000 mg | ORAL_TABLET | Freq: Four times a day (QID) | ORAL | Status: DC | PRN
  Administered 2016-04-12 – 2016-04-14 (×4): 650 mg via ORAL
  Filled 2016-04-11 (×4): qty 2

## 2016-04-11 MED ORDER — LEVOFLOXACIN IN D5W 750 MG/150ML IV SOLN: 750.0000 mg | INTRAVENOUS | Status: DC

## 2016-04-11 MED ORDER — METHYLPREDNISOLONE SODIUM SUCC 125 MG IJ SOLR
125.0000 mg | Freq: Once | INTRAMUSCULAR | Status: AC
  Administered 2016-04-11: 125 mg via INTRAVENOUS
  Filled 2016-04-11: qty 2

## 2016-04-11 MED ORDER — LEVOFLOXACIN IN D5W 750 MG/150ML IV SOLN
750.0000 mg | INTRAVENOUS | Status: AC
  Administered 2016-04-11: 750 mg via INTRAVENOUS
  Filled 2016-04-11: qty 150

## 2016-04-11 MED ORDER — BISACODYL 10 MG RE SUPP: 10.0000 mg | Freq: Every day | RECTAL | Status: DC | PRN

## 2016-04-11 MED ORDER — IPRATROPIUM-ALBUTEROL 0.5-2.5 (3) MG/3ML IN SOLN
3.0000 mL | Freq: Four times a day (QID) | RESPIRATORY_TRACT | Status: DC
  Administered 2016-04-11 – 2016-04-14 (×16): 3 mL via RESPIRATORY_TRACT
  Filled 2016-04-11 (×15): qty 3

## 2016-04-11 MED ORDER — ONDANSETRON HCL 4 MG PO TABS: 4.0000 mg | ORAL_TABLET | Freq: Four times a day (QID) | ORAL | Status: DC | PRN

## 2016-04-11 NOTE — Care Management (Signed)
[facility] - GIP RN Visit  This is a related, covered GIP admission on 04/11/16 per MD Monguilod.  Patient was admitted to Hospice on 02/17/16 with COPD.  Code status noted as partial/DNI - no intubation, chest compressions, or defibrillation.  She was admitted on 04/11/16 early AM hours with COPD exacerbation and altered mental status (AMS).  She presented yesterday with the complaint that she had passed out yesterday.  She also notes productive cough and increased shortness of breath (SOB).  She notes that for (2) days, she had progressive somnolence, confusion, SOB, and multiple falls.  Her family stated that she ha sustained (4) falls in (2) days with one fall noting that she hit her head on the floor.  She is dependent on O2 at home.  She is currently on BiPAP here at the hospital today.  O2 sats 100%; on admission, she was noted to have pO2 of 79%, pH 7.22, and PCO2 was 80.  Hgb was noted at 6.9, which is down from 8.2 on last month.  She is presently being transfused with RBC's (A pos) during my visit on today.    RN visited with her in her room on today and she is awake and alert, and states "I'm just sore all over.  I know I've fallen several times, and maybe that's why."  RN acknowledges.  RN reviews medication list and notes she has received her Fentanyl inj 12.735mcg, Neurontin 300mg , as well as APAP 650mg  on today.  She asks RN to turn on her television to channel 12.  I did this and she was grateful.  She notes "I am hungry."  I have spoken with RN Ginger on today and she is aware of this request.  Patient requires BiPAP at this time.  Otherwise, no new concerns from Ginger RN regarding patient status.    Plan: COPD - She was given IV Solu-Medrol 125mg  in the ED, admit to SDU, continue BiPAP, schedule Duonebs and Dulera, continue systemic steroid Solu-Medrol, start Levaquin, and repeat ABG after a couple hours on BiPAP Symptomatic Anemia - (2)  units PRBC's ordered, check post transfusion CBC Acute encephalopathy - likely secondary to hypercarbia, reported by family; falls x4 in the last couple of days with one striking her head on the floor with no LOC, check head CT, anticipate some improvement with transfusion and BiPAP Chronic diastolic CHF - appears dry on admission and weight is stable, managed on home lasix 20mg  that is held on admission, plan is to follow daily weights and I/O's Anxiety - somnolence on admission, resume Seroquel once appropriate and continue low dose prn benzodiazepine Hyperglycemia - serum glucose 180 on admission, likely due to prednisone, she will be on systemic steroids, check CBG q4hr for now and Novolog ordered  Labs:  04/11/16 CBC:  WBC 27.1, Neutroabs 23.5, Hgb 6.9, Hct 24.5, Plt 412.  04/11/16 BMP:  CO2 33, Glucose 180, BUN 36, Ca 8.6.  04/11/16 CXR notes slightly improved aeration of the left lower lobe (LLL).  Small amount of right basilar atelectasis.  Cardiomediastinal silhouette is unchanged with persistent calcific aortic atherosclerosis.  No pneumothorax or pleural effusion.  Please call with any hospice related questions or concerns  Thank you, Chrisandra NettersCarlos S. Gwendolyn GrantWalden RN MSN [facility] ON CALL RN [phone]

## 2016-04-11 NOTE — ED Triage Notes (Signed)
Pt arrived via EMS from home. Patient is a Hospice patient. States the hospice nurse has been out more often as patient seems to be more confused over the past 2 days with increase in fall, lethargy, and unsteady gait. Patient has hx of COPD. EMS administered 10mg  Albuterol/5mg  Atrovent via nebulizer. VS 136/60, 100, 24resp, 96% 2L, 187 cbg. She is also on 2L/[place] continuously at home. Per ems patient denies any pain and was able to verify name and dob.

## 2016-04-11 NOTE — Evaluation (Signed)
Physical Therapy Evaluation Patient Details Name: SAMARRAH TRANCHINA MRN: [id number] DOB: [birth year] Today's Date: 04/11/2016   History of Present Illness  Pt admitted through ed with SOB, resp distress, AMS and frequent falls.  Pt with hx of DM, PVD, COPD, chronic anemia and ETOH abuse.  Clinical Impression  Pt admitted as above and presenting with functional mobility limitations 2* generalized weakness, balance deficits, poor safety awareness and cognition.  Pt would benefit from follow up rehab at SNF level to maximize IND and safety.    Follow Up Recommendations SNF    Equipment Recommendations  None recommended by PT    Recommendations for Other Services OT consult     Precautions / Restrictions Precautions Precautions: Fall Restrictions Weight Bearing Restrictions: No      Mobility  Bed Mobility Overal bed mobility: Needs Assistance Bed Mobility: Supine to Sit     Supine to sit: Min assist;+2 for physical assistance;+2 for safety/equipment;HOB elevated     General bed mobility comments: cues for sequence, physical assist to manage LEs over EOB and to bring trunk to upright and balance in EOB sitting  Transfers Overall transfer level: Needs assistance Equipment used: Rolling walker (2 wheeled) Transfers: Sit to/from Stand Sit to Stand: Min assist;+2 physical assistance;+2 safety/equipment;From elevated surface         General transfer comment: cues for safe transition position and use of UEs to self assist  Ambulation/Gait Ambulation/Gait assistance: Min assist;+2 physical assistance;+2 safety/equipment Ambulation Distance (Feet): 150 Feet Assistive device: Rolling walker (2 wheeled) Gait Pattern/deviations: Step-through pattern;Shuffle;Trunk flexed;Narrow base of support Gait velocity: decr Gait velocity interpretation: Below normal speed for age/gender General Gait Details: cues for posture, position from RW, to increase BOS and for basic safety awareness.   Physical assist for balance/support and to manage RW  Stairs            Wheelchair Mobility    Modified Rankin (Stroke Patients Only)       Balance Overall balance assessment: Needs assistance Sitting-balance support: Feet supported;Bilateral upper extremity supported Sitting balance-Leahy Scale: Fair     Standing balance support: Bilateral upper extremity supported Standing balance-Leahy Scale: Poor                               Pertinent Vitals/Pain Pain Assessment: 0-10 Pain Location: all over Pain Descriptors / Indicators: Aching Pain Intervention(s): Patient requesting pain meds-RN notified    Home Living Family/patient expects to be discharged to:: Private residence Living Arrangements: Alone Available Help at Discharge: Family;Available PRN/intermittently Type of Home: House Home Access: Level entry     Home Layout: One level Home Equipment: Walker - 2 wheels;Shower seat;Cane - single point;Grab bars - tub/shower Additional Comments: Information from previous recent admission.  Pt unfocused and not answering questions posed.    Prior Function Level of Independence: Needs assistance   Gait / Transfers Assistance Needed: walking with a rollator     Comments: Info from previous visit - pt providing no concrete information      Hand Dominance   Dominant Hand: Right    Extremity/Trunk Assessment   Upper Extremity Assessment Upper Extremity Assessment: Generalized weakness    Lower Extremity Assessment Lower Extremity Assessment: Generalized weakness    Cervical / Trunk Assessment Cervical / Trunk Assessment: Kyphotic  Communication   Communication: No difficulties  Cognition Arousal/Alertness: Awake/alert Behavior During Therapy: Impulsive;Restless Overall Cognitive Status: No family/caregiver present to determine baseline cognitive functioning  General Comments      Exercises      Assessment/Plan    PT Assessment Patient needs continued PT services  PT Problem List Decreased strength;Decreased activity tolerance;Decreased balance;Decreased mobility;Decreased cognition;Decreased knowledge of use of DME;Pain;Decreased safety awareness       PT Treatment Interventions DME instruction;Gait training;Functional mobility training;Therapeutic activities;Therapeutic exercise;Balance training;Patient/family education    PT Goals (Current goals can be found in the Care Plan section)  Acute Rehab PT Goals Patient Stated Goal: Go home PT Goal Formulation: Patient unable to participate in goal setting Time For Goal Achievement: 04/24/16 Potential to Achieve Goals: Fair    Frequency Min 3X/week   Barriers to discharge Decreased caregiver support Pt lives alone    Co-evaluation               End of Session Equipment Utilized During Treatment: Gait belt;Oxygen Activity Tolerance: Patient tolerated treatment well Patient left: in chair;with call bell/phone within reach;with chair alarm set;with nursing/sitter in room Nurse Communication: Mobility status PT Visit Diagnosis: Unsteadiness on feet (R26.81);Repeated falls (R29.6)         Time: [id number] PT Time Calculation (min) (ACUTE ONLY): 28 min   Charges:   PT Evaluation $PT Eval Moderate Complexity: 1 Procedure PT Treatments $Gait Training: 8-22 mins   PT G Codes:         Titilayo Hagans 04/11/2016, 4:59 PM

## 2016-04-11 NOTE — ED Notes (Signed)
Bed: [id number] Expected date:  Expected time:  Means of arrival:  Comments: 71 yo F/ Difficulty Breathing-AMS

## 2016-04-11 NOTE — Progress Notes (Signed)
Palliative medicine consult received for symptom management (pain).  Patient enrolled with [facility] for home hospice services.    I stopped by to meet with [name], however, I was unable to discuss with her today as she was working with PT and ambulating in the hall at time of visit.    Reviewed chart and discussed with her bedside RN.  Pain not currently an issue.  She has not received any PRN medication today for pain.  We discussed plan to be aggressive with currently ordered PRN medications if pain becomes an issue again.    PMT to check in again tomorrow to see if we can be of further assistance.    Please call if there are specific areas with which our team can be of assistance.  Nicole MinusGene Keirah Konitzer, MD [facility] Team [phone]  NO CHARGE NOTE

## 2016-04-11 NOTE — Progress Notes (Signed)
PROGRESS NOTE                                                                                                                                                                                                             Patient Demographics:    Nicole Maxwell, is a 71 y.o. female, DOB - 01-25-1946, XBJ:[id number]  Admit date - 04/11/2016   Admitting Physician Briscoe Deutscherimothy S Opyd, MD  Outpatient Primary MD for the patient is [name], William, MD  LOS - 0   Chief Complaint  Patient presents with  . COPD  . Altered Mental Status       Brief Narrative   71 y.o. female with medical history significant for COPD with chronic hypercarbic respiratory failure, anemia of chronic disease, anxiety, depression, and chronic low back pain on home hospice who presents to the emergency department with 2 days of progressive somnolence, confusion, apparent respiratory distress, and recurrent falls, workup significant for acute on chronic hypoxic/hypercarbic respiratory failure with COPD exacerbation, patient is DO NOT INTUBATE.   Subjective:    Nicole Maxwell today Lethargic, cannot provide any complaints . No significant events this a.m.    Assessment  & Plan :    Principal Problem:   COPD exacerbation (HCC) Active Problems:   Severe chronic obstructive pulmonary disease (HCC)   HTN (hypertension)   Chronic diastolic congestive heart failure (HCC)   DM type 2 (diabetes mellitus, type 2) (HCC)   Normocytic anemia   Acute encephalopathy   Essential hypertension   Symptomatic anemia   COPD with acute exacerbation (HCC)   Acute and chronic respiratory failure with hypercapnia (HCC)   COPD with acute exacerbation, acute on chronic respiratory failure with hypoxia and hypercarbia  - Pt has severe chronic COPD on 2 Lpm chronically, BiPAP at night, and chronic prednisone  - She presents somnolent, in respiratory distress, and with pH 7.22, pCO2 80, pO2 79  - CXR features a  chronic LLL opacity with improved aeration from prior  - P CO2 appears to be improving on BiPAP, and tinea with IV Solu-Medrol, DuoNeb's, Dulera,, and IV levofloxacin . She was given 125 mg IV Solu-Medrol in ED and started on BiPAP    Symptomatic RN deficiency anemia  - Pt presents with somnolence, attributable to hypercarbia, but with Hgb only 6.9 and likely contributing  - No  melena or hematochezia reported and FOBT is negative  - Hgb had been ranging from 7.6 to 8.6 in February 2018 - 2 units pRBCs ordered  - Check post-transfusion CBC   Acute encephalopathy  - Likely secondary to hypercarbia  - Family reports 4 falls over the days leading up to admission, one with head-strike, but no LOC  - Check head CT  - Anticipate some improvement with transfusion and BiPAP    Chronic diastolic CHF  - Appears dry on admission, wt stable   - TTE (07/06/15) with EF 50-55%, grade 1 diastolic dysfunction, no WMA or significant valvular disease noted  - Managed at home with Lasix 20 mg qD, held on admission  - Plan to follow daily wts and I/O's   Anxiety, depression - Pt somnolent on admission  - Resume Seroquel once appropriate for oral meds - Continue low-dose prn benzodiazepine    Hyperglycemia  - Serum glucose 180 on admission, likely secondary to prednisone  - A1c 6.0% in February 2018 - She will be on systemic steroids  - Check CBG q4h for now with low-intensity Novolog correctional       Code Status : Partial/DNI  Family Communication  : None at bedside  Disposition Plan  : Pending further work up.  Consults  :  None  Procedures  : None   DVT Prophylaxis  :  SCDs  Lab Results  Component Value Date   PLT 412 (H) 04/11/2016    Antibiotics  :    Anti-infectives    Start     Dose/Rate Route Frequency Ordered Stop   04/13/16 0600  levofloxacin (LEVAQUIN) IVPB 750 mg     750 mg 100 mL/hr over 90 Minutes Intravenous Every 48 hours 04/11/16 0453     04/11/16 0500   levofloxacin (LEVAQUIN) IVPB 750 mg     750 mg 100 mL/hr over 90 Minutes Intravenous NOW 04/11/16 0429 04/11/16 0724        Objective:   Vitals:   04/11/16 0402 04/11/16 0500 04/11/16 0628 04/11/16 1007  BP: (!) 125/53  (!) 155/54   Pulse: 89   81  Resp: 18  (!) 24 (!) 30  Temp: 98.5 F (36.9 C)   97.4 F (36.3 C)  TempSrc: Oral   Axillary  SpO2: 98%  99% 100%  Weight:  57 kg (125 lb 10.6 oz)    Height:        Wt Readings from Last 3 Encounters:  04/11/16 57 kg (125 lb 10.6 oz)  03/19/16 57.5 kg (126 lb 12.8 oz)  03/04/16 55.3 kg (121 lb 14.6 oz)     Intake/Output Summary (Last 24 hours) at 04/11/16 1029 Last data filed at 04/11/16 1007  Gross per 24 hour  Intake              180 ml  Output                0 ml  Net              180 ml     Physical Exam  Lethargic on Bipap, in NAD Supple Neck,No JVD,  Symmetrical Chest wall movement, fair air movement bilaterally, No wheezing RRR,No Gallops,Rubs or new Murmurs, No Parasternal Heave +ve B.Sounds, Abd Soft, No tenderness,No rebound - guarding or rigidity. No Cyanosis, Clubbing or edema, No new Rash or bruise     Data Review:    CBC  Recent Labs Lab 04/11/16 0028  WBC 27.1*  HGB  6.9*  HCT 24.5*  PLT 412*  MCV 79.5  MCH 22.4*  MCHC 28.2*  RDW 19.4*  LYMPHSABS 2.2  MONOABS 1.4*  EOSABS 0.0  BASOSABS 0.0    Chemistries   Recent Labs Lab 04/11/16 0028  NA 143  K 4.8  CL 105  CO2 33*  GLUCOSE 180*  BUN 36*  CREATININE 0.93  CALCIUM 8.6*   ------------------------------------------------------------------------------------------------------------------ No results for input(s): CHOL, HDL, LDLCALC, TRIG, CHOLHDL, LDLDIRECT in the last 72 hours.  Lab Results  Component Value Date   HGBA1C 6.0 (H) 02/28/2016   ------------------------------------------------------------------------------------------------------------------ No results for input(s): TSH, T4TOTAL, T3FREE, THYROIDAB in the  last 72 hours.  Invalid input(s): FREET3 ------------------------------------------------------------------------------------------------------------------  Recent Labs  04/11/16 0643  VITAMINB12 526  FOLATE 20.1  FERRITIN 16  TIBC 358  IRON 6*  RETICCTPCT 1.2    Coagulation profile No results for input(s): INR, PROTIME in the last 168 hours.  No results for input(s): DDIMER in the last 72 hours.  Cardiac Enzymes No results for input(s): CKMB, TROPONINI, MYOGLOBIN in the last 168 hours.  Invalid input(s): CK ------------------------------------------------------------------------------------------------------------------    Component Value Date/Time   BNP 175.2 (H) 04/11/2016 0028    Inpatient Medications  Scheduled Meds: . dextromethorphan-guaiFENesin  1 tablet Oral Daily  . fluticasone  1 spray Each Nare BID  . gabapentin  300 mg Oral BID  . insulin aspart  0-9 Units Subcutaneous Q4H  . ipratropium-albuterol  3 mL Nebulization Q6H  . latanoprost  1 drop Both Eyes QHS  . [START ON 04/13/2016] levofloxacin (LEVAQUIN) IV  750 mg Intravenous Q48H  . methylPREDNISolone (SOLU-MEDROL) injection  60 mg Intravenous Q6H  . mometasone-formoterol  2 puff Inhalation BID  . QUEtiapine  12.5 mg Oral QHS  . sodium chloride flush  3 mL Intravenous Q12H   Continuous Infusions: PRN Meds:.acetaminophen, bisacodyl, fentaNYL (SUBLIMAZE) injection, ipratropium-albuterol, LORazepam, ondansetron **OR** ondansetron (ZOFRAN) IV, polyethylene glycol, polyvinyl alcohol  Micro Results Recent Results (from the past 240 hour(s))  MRSA PCR Screening     Status: None   Collection Time: 04/11/16  7:05 AM  Result Value Ref Range Status   MRSA by PCR NEGATIVE NEGATIVE Final    Comment:        The GeneXpert MRSA Assay (FDA approved for NASAL specimens only), is one component of a comprehensive MRSA colonization surveillance program. It is not intended to diagnose MRSA infection nor to guide  or monitor treatment for MRSA infections.     Radiology Reports Dg Chest Port 1 View  Result Date: 04/11/2016 CLINICAL DATA:  Chest pain EXAM: PORTABLE CHEST 1 VIEW COMPARISON:  Chest radiograph 02/29/2016 FINDINGS: Slightly improved aeration of the left lower lobe. Small amount of right basilar atelectasis. Cardiomediastinal silhouette is unchanged with persistent calcific aortic atherosclerosis. No pneumothorax or pleural effusion. IMPRESSION: Improved aeration of the left lower lobe. Electronically Signed   By: Deatra Robinson M.D.   On: 04/11/2016 01:33     ELGERGAWY, DAWOOD M.D on 04/11/2016 at 10:29 AM  Between 7am to 7pm - Pager - [phone]  After 7pm go to [email or web address] - password [facility]  [facility] -  Office  [phone]

## 2016-04-11 NOTE — H&P (Signed)
History and Physical    Nicole Maxwell WUJ:[id number] DOB: 1945-12-10 DOA: 04/11/2016  PCP: [name], William, MD   Patient coming from: Home  Chief Complaint: Lethargy, confusion, respiratory distress, falls   HPI: Nicole Ibalta S Quinton is a 71 y.o. female with medical history significant for COPD with chronic hypercarbic respiratory failure, anemia of chronic disease, anxiety, depression, and chronic low back pain on home hospice who presents to the emergency department with 2 days of progressive somnolence, confusion, apparent respiratory distress, and recurrent falls. History is obtained from the patient's children at the bedside as patient is quite somnolent. She had reportedly been in her usual state of severe chronic illness, but stable until 2 days ago when her family noted increased work of breathing and progressive somnolence and confusion. Patient was noted to fall 4 times over the past 2 days per family, once striking her head on the floor. There was no loss of consciousness and the patient is not anticoagulated. There has been no fevers reported and no vomiting or diarrhea. Patient has complained of "pain all over," but not voicing any other specific complaints. There's been no recent long distance travel or sick contacts and the family does not know of any melena or hematochezia.  ED Course: Upon arrival to the ED, patient is found to be afebrile, saturating adequately on 2 L/m supplemental oxygen, tachypneic, and with normal heart rate and blood pressure. Chest x-ray demonstrates a chronic left lower lobe opacity with improved aeration when compared to recent priors. Chemistry panel is notable for a stable bicarbonate at 33 and an elevated BUN to creatinine ratio at roughly 40. CBC is notable for a chronic leukocytosis, now 27,000. Also noted on CBC is a hemoglobin of 6.9, down from 8.2 last month. BNP is mildly elevated 275 and troponin is undetectable. Fecal occult blood testing was negative. ABG was  performed with pH 7.22, pCO2 of 80, and pO2 of 79. Type and screen was performed and 2 units of blood were ordered for immediate transfusion. Patient was given 125 mg of IV Solu-Medrol and placed on BiPAP. She has remained hemodynamically stable, but very somnolent and using accessory muscles to breathe. She'll be admitted to the stepdown unit for ongoing evaluation and management of increased somnolence and confusion with recurrent falls, likely secondary to acute exacerbation and COPD and acute on chronic anemia.  Review of Systems:  Unable to obtain complete ROS secondary to the patient's clinical condition with somnolence and acute encephalopathy.  Past Medical History:  Diagnosis Date  . Alcohol abuse, in remission quit 2004  . Anemia of chronic disease   . Anxiety    Xanax prn  . Arthritis    "my whole body"  . Asthma   . Chronic back pain    "goes from the lower part of my back on down my legs"  (04/17/2014)  . Chronic respiratory failure with hypercapnia (HCC)   . COPD (chronic obstructive pulmonary disease) (HCC)    cxr 04/03/2010 - hyperinflation  . COPD exacerbation (HCC)   . Dysphagia   . Esophageal dysmotility   . HCAP (healthcare-associated pneumonia)   . Hyperlipidemia   . Hypertension   . Leukocytosis   . Neuromuscular disorder (HCC)    lumbar disc  . On home oxygen therapy    "2L; suppose to be 24/7" (04/17/2014)  . Osteopenia   . Physical deconditioning   . Pneumonia    "3 times in the last month or so" (04/17/2014)  . Protein  calorie malnutrition (HCC)   . PVD (peripheral vascular disease) (HCC)   . Shortness of breath    with anxiety  and activty  . Tobacco abuse    " I am addicted"   . Type II diabetes mellitus (HCC)   . Vitamin D deficiency disease    03/26/2010 - 9.9ng/mL. Started on replacemnt    Past Surgical History:  Procedure Laterality Date  . ABDOMINAL HYSTERECTOMY     "partial"  . APPENDECTOMY  1960  . BACK SURGERY    . CARPAL TUNNEL RELEASE  Bilateral    CTS repair 2000 (R), 2006 (L)/notes 01/01/2014  . CATARACT EXTRACTION W/ INTRAOCULAR LENS  IMPLANT, BILATERAL Bilateral 2015  . LUMBAR LAMINECTOMY/DECOMPRESSION MICRODISCECTOMY  2005   L4-5/notes 03/06/2010  . POSTERIOR LUMBAR FUSION  2002  . TONSILLECTOMY  1960     reports that she has been smoking Cigarettes.  She has a 14.00 pack-year smoking history. She has never used smokeless tobacco. She reports that she drinks alcohol. She reports that she does not use drugs.  Allergies  Allergen Reactions  . Brovana [Arformoterol Tartrate] Shortness Of Breath and Cough    General intolerance  . Budesonide Shortness Of Breath and Cough    General intolerance  . Mucomyst [Acetylcysteine] Shortness Of Breath    Family History  Problem Relation Age of Onset  . Heart attack Sister 48    CABG  . Breast cancer Sister   . Cancer Brother   . Heart attack Brother   . Hypertension Other     all siblings  . Anesthesia problems Neg Hx      Prior to Admission medications   Medication Sig Start Date End Date Taking? Authorizing Provider  acetaminophen (TYLENOL) 500 MG tablet Take 500 mg by mouth every 6 (six) hours as needed for mild pain.   Yes Historical Provider, MD  albuterol (PROAIR HFA) 108 (90 Base) MCG/ACT inhaler Inhale 2 puffs into the lungs every 6 (six) hours as needed. Patient taking differently: Inhale 2 puffs into the lungs 4 (four) times daily.  03/04/16  Yes Drema Dallas, MD  albuterol (PROVENTIL) (2.5 MG/3ML) 0.083% nebulizer solution Take 3 mLs (2.5 mg total) by nebulization every 4 (four) hours as needed for wheezing or shortness of breath. (may take 1 every 4 hours as needed for wheezing/shortness of breath) DX: 496 03/04/16  Yes Drema Dallas, MD  ALPRAZolam Prudy Feeler) 0.5 MG tablet Take 1 tablet (0.5 mg total) by mouth 2 (two) times daily as needed for anxiety. 03/04/16  Yes Drema Dallas, MD  ALPRAZolam Prudy Feeler) 1 MG tablet Take 1 tablet (1 mg total) by mouth at  bedtime. 03/04/16  Yes Drema Dallas, MD  aspirin EC 81 MG EC tablet Take 1 tablet (81 mg total) by mouth daily. 01/09/14  Yes [name], MD  budesonide-formoterol [facility]) 160-4.5 MCG/ACT inhaler Inhale 2 puffs into the lungs 2 (two) times daily. 03/04/16  Yes Drema Dallas, MD  Dextromethorphan-Guaifenesin (MUCINEX DM MAXIMUM STRENGTH) 60-1200 MG TB12 Take 1 tablet by mouth every 12 (twelve) hours. Patient taking differently: Take 1 tablet by mouth daily.  03/04/16  Yes Drema Dallas, MD  fluticasone (FLONASE) 50 MCG/ACT nasal spray Place 1 spray into both nostrils 2 (two) times daily. 03/04/16  Yes Drema Dallas, MD  furosemide (LASIX) 20 MG tablet Take 20 mg by mouth daily.   Yes Historical Provider, MD  gabapentin (NEURONTIN) 300 MG capsule Take 300 mg by mouth 2 (two)  times daily.   Yes Historical Provider, MD  HYDROcodone-acetaminophen (NORCO/VICODIN) 5-325 MG tablet Take 1 tablet by mouth every 4 (four) hours as needed for moderate pain. 03/04/16  Yes Drema Dallas, MD  hydroxypropyl methylcellulose / hypromellose (ISOPTO TEARS / GONIOVISC) 2.5 % ophthalmic solution Place 1 drop into both eyes 3 (three) times daily as needed for dry eyes. 03/04/16  Yes Drema Dallas, MD  ipratropium-albuterol (DUONEB) 0.5-2.5 (3) MG/3ML SOLN Take 3 mLs by nebulization 4 (four) times daily. 03/04/16  Yes Drema Dallas, MD  latanoprost (XALATAN) 0.005 % ophthalmic solution Place 1 drop into both eyes at bedtime. 03/04/16  Yes Drema Dallas, MD  OXYGEN Inhale 2 L into the lungs continuous.   Yes Historical Provider, MD  potassium chloride SA (K-DUR,KLOR-CON) 20 MEQ tablet Take 20 mEq by mouth daily. 01/11/16  Yes Historical Provider, MD  predniSONE (DELTASONE) 5 MG tablet Take 1 tablet (5 mg total) by mouth daily with breakfast. 03/05/16  Yes Drema Dallas, MD  QUEtiapine (SEROQUEL) 25 MG tablet Take 0.5 tablets (12.5 mg total) by mouth at bedtime. 03/04/16  Yes Drema Dallas, MD  atorvastatin (LIPITOR) 20 MG tablet  Take 1 tablet (20 mg total) by mouth daily. Patient not taking: Reported on 04/11/2016 03/04/16   Drema Dallas, MD    Physical Exam: Vitals:   04/11/16 0048 04/11/16 0243 04/11/16 0300 04/11/16 0402  BP:  (!) 122/49 (!) 125/52 (!) 125/53  Pulse:  95 94 89  Resp:  (!) 29 (!) 33 18  Temp:      TempSrc:      SpO2:  92% 93% 98%  Weight: 54.4 kg (120 lb)     Height: 5\' 2"  (1.575 m)         Constitutional: In respiratory distress with accessory muscle recruitment and tachypnea. Somnolent. Pale.  Eyes: PERTLA, lids and conjunctivae normal ENMT: Mucous membranes are moist. Posterior pharynx clear of any exudate or lesions.   Neck: normal, supple, no masses, no thyromegaly Respiratory: Markedly diminished bilaterally. Increased WOB. BiPAP in use.   Cardiovascular: S1 & S2 heard, regular rate and rhythm. No extremity edema. No significant JVD. Abdomen: No distension, no tenderness, no masses palpated. Bowel sounds normal.  Musculoskeletal: no clubbing / cyanosis. No joint deformity upper and lower extremities.    Skin: no significant rashes, lesions, ulcers. Warm, dry, well-perfused. Pallor, poor turgor.  Neurologic: No gross facial asymmetry, moving all extremities. PERRL. Patellar DTR's normal b/l. Opens eyes to voice, makes eye-contact, but no verbal response.   Psychiatric: Difficult to assess given the clinical scenario.      Labs on Admission: I have personally reviewed following labs and imaging studies  CBC:  Recent Labs Lab 04/11/16 0028  WBC 27.1*  NEUTROABS 23.5*  HGB 6.9*  HCT 24.5*  MCV 79.5  PLT 412*   Basic Metabolic Panel:  Recent Labs Lab 04/11/16 0028  NA 143  K 4.8  CL 105  CO2 33*  GLUCOSE 180*  BUN 36*  CREATININE 0.93  CALCIUM 8.6*   GFR: Estimated Creatinine Clearance: 43.9 mL/min (by C-G formula based on SCr of 0.93 mg/dL). Liver Function Tests: No results for input(s): AST, ALT, ALKPHOS, BILITOT, PROT, ALBUMIN in the last 168 hours. No  results for input(s): LIPASE, AMYLASE in the last 168 hours. No results for input(s): AMMONIA in the last 168 hours. Coagulation Profile: No results for input(s): INR, PROTIME in the last 168 hours. Cardiac Enzymes: No results for input(s):  CKTOTAL, CKMB, CKMBINDEX, TROPONINI in the last 168 hours. BNP (last 3 results) No results for input(s): PROBNP in the last 8760 hours. HbA1C: No results for input(s): HGBA1C in the last 72 hours. CBG: No results for input(s): GLUCAP in the last 168 hours. Lipid Profile: No results for input(s): CHOL, HDL, LDLCALC, TRIG, CHOLHDL, LDLDIRECT in the last 72 hours. Thyroid Function Tests: No results for input(s): TSH, T4TOTAL, FREET4, T3FREE, THYROIDAB in the last 72 hours. Anemia Panel: No results for input(s): VITAMINB12, FOLATE, FERRITIN, TIBC, IRON, RETICCTPCT in the last 72 hours. Urine analysis:    Component Value Date/Time   COLORURINE YELLOW 02/28/2016 1116   APPEARANCEUR HAZY (A) 02/28/2016 1116   LABSPEC 1.020 02/28/2016 1116   PHURINE 5.0 02/28/2016 1116   GLUCOSEU NEGATIVE 02/28/2016 1116   HGBUR NEGATIVE 02/28/2016 1116   BILIRUBINUR NEGATIVE 02/28/2016 1116   KETONESUR NEGATIVE 02/28/2016 1116   PROTEINUR 30 (A) 02/28/2016 1116   NITRITE NEGATIVE 02/28/2016 1116   LEUKOCYTESUR NEGATIVE 02/28/2016 1116   Sepsis Labs: @LABRCNTIP (procalcitonin:4,lacticidven:4) )No results found for this or any previous visit (from the past 240 hour(s)).   Radiological Exams on Admission: Dg Chest Port 1 View  Result Date: 04/11/2016 CLINICAL DATA:  Chest pain EXAM: PORTABLE CHEST 1 VIEW COMPARISON:  Chest radiograph 02/29/2016 FINDINGS: Slightly improved aeration of the left lower lobe. Small amount of right basilar atelectasis. Cardiomediastinal silhouette is unchanged with persistent calcific aortic atherosclerosis. No pneumothorax or pleural effusion. IMPRESSION: Improved aeration of the left lower lobe. Electronically Signed   By: Deatra Robinson  M.D.   On: 04/11/2016 01:33    EKG: Not performed, will obtain as appropriate.  Assessment/Plan  1. COPD with acute exacerbation, acute on chronic respiratory failure with hypoxia and hypercarbia  - Pt has severe chronic COPD on 2 Lpm chronically, BiPAP at night, and chronic prednisone  - She presents somnolent, in respiratory distress, and with pH 7.22, pCO2 80, pO2 79  - CXR features a chronic LLL opacity with improved aeration from prior  - She was given 125 mg IV Solu-Medrol in ED and started on BiPAP  - Plan to admit to SDU, continue BiPAP, schedule DuoNebs and Dulera, continue systemic steroid with Solu-Medrol, start Levaquin, and repeat ABG after a couple hrs on BiPAP    2. Symptomatic anemia  - Pt presents with somnolence, attributable to hypercarbia, but with Hgb only 6.9 and likely contributing  - No melena or hematochezia reported and FOBT is negative  - Hgb had been ranging from 7.6 to 8.6 in February 2018 - Anemia panel sent, type & screen performed, and 2 units pRBCs ordered  - Check post-transfusion CBC   3. Acute encephalopathy  - Likely secondary to hypercarbia  - Family reports 4 falls over the days leading up to admission, one with head-strike, but no LOC  - Check head CT  - Anticipate some improvement with transfusion and BiPAP    4. Chronic diastolic CHF  - Appears dry on admission, wt stable   - TTE (07/06/15) with EF 50-55%, grade 1 diastolic dysfunction, no WMA or significant valvular disease noted  - Managed at home with Lasix 20 mg qD, held on admission  - Plan to follow daily wts and I/O's   5. Anxiety, depression - Pt somnolent on admission  - Resume Seroquel once appropriate for oral meds - Continue low-dose prn benzodiazepine    6. Hyperglycemia  - Serum glucose 180 on admission, likely secondary to prednisone  - A1c 6.0% in  February 2018 - She will be on systemic steroids  - Check CBG q4h for now with low-intensity Novolog correctional    DVT  prophylaxis: SCD's  Code Status: Partial -- no intubation, chest compressions, or defibrillation Family Communication: Children updated at bedside Disposition Plan: Admit to SDU Consults called: None Admission status: Inptatient    Briscoe Deutscher, MD [facility] Pager [phone]  If 7PM-7AM, please contact night-coverage [email or web address] Password [facility]  04/11/2016, 4:29 AM

## 2016-04-11 NOTE — Progress Notes (Signed)
Pt transported to ICU from ED on BIPAP without complication.  RT to monitor and assess as needed.

## 2016-04-11 NOTE — Progress Notes (Signed)
PHARMACY NOTE:  ANTIMICROBIAL RENAL DOSAGE ADJUSTMENT  Current antimicrobial regimen includes a mismatch between antimicrobial dosage and estimated renal function.  As per policy approved by the Pharmacy & Therapeutics and Medical Executive Committees, the antimicrobial dosage will be adjusted accordingly.  Current antimicrobial dosage:  Levofloxacin 500mg  iv q24hr  Indication: CAP  Renal Function:  Estimated Creatinine Clearance: 43.9 mL/min (by C-G formula based on SCr of 0.93 mg/dL). []      On intermittent HD, scheduled: []      On CRRT    Antimicrobial dosage has been changed to:  Levofloxacin 750mg  iv q48hr  Additional comments:    Thank you for allowing pharmacy to be a part of this patient's care.  Aleene DavidsonGrimsley Jr, Shankar Silber [place], [facility] LLCRPH 04/11/2016 4:53 AM

## 2016-04-11 NOTE — ED Notes (Signed)
Attempted to call the daughter [name] x3 times to obtain blood consent. No answer only a VM. Requested she contact the department to discuss.

## 2016-04-11 NOTE — ED Notes (Signed)
Family at bedside states patient has had multiple falls over the past few weeks. Also states it seems as though her breathing has gotten somewhat worst over the past 2 days. Patient is somewhat drowsy, but easily arousable. Alert to self. Able to recognize family and verbalize name and dob. Follows commands.

## 2016-04-11 NOTE — ED Provider Notes (Signed)
[facility]-EMERGENCY DEPT Provider Note   CSN: [id number] Arrival date & time: 04/11/16  0002  By signing my name below, I, Diona Browner, attest that this documentation has been prepared under the direction and in the presence of Dione Booze, MD. Electronically Signed: Diona Browner, ED Scribe. 04/11/16. 12:46 AM.   History   Chief Complaint Chief Complaint  Patient presents with  . COPD  . Altered Mental Status    HPI Nicole Maxwell is a 71 y.o. female Hospice patient who has a PMHx of COPD,  BIB EMS,  presents to the Emergency Department s/p passing out yesterday. Pt stated that she should have come in yesterday after onset occurred. She reports being weak, sore, and "hurting all over". Associated sx include a productive cough. Pt denies chills, sweats, and no increased SOB.   The history is provided by the patient. No language interpreter was used.    Past Medical History:  Diagnosis Date  . Alcohol abuse, in remission quit 2004  . Anemia of chronic disease   . Anxiety    Xanax prn  . Arthritis    "my whole body"  . Asthma   . Chronic back pain    "goes from the lower part of my back on down my legs"  (04/17/2014)  . Chronic respiratory failure with hypercapnia (HCC)   . COPD (chronic obstructive pulmonary disease) (HCC)    cxr 04/03/2010 - hyperinflation  . COPD exacerbation (HCC)   . Dysphagia   . Esophageal dysmotility   . HCAP (healthcare-associated pneumonia)   . Hyperlipidemia   . Hypertension   . Leukocytosis   . Neuromuscular disorder (HCC)    lumbar disc  . On home oxygen therapy    "2L; suppose to be 24/7" (04/17/2014)  . Osteopenia   . Physical deconditioning   . Pneumonia    "3 times in the last month or so" (04/17/2014)  . Protein calorie malnutrition (HCC)   . PVD (peripheral vascular disease) (HCC)   . Shortness of breath    with anxiety  and activty  . Tobacco abuse    " I am addicted"   . Type II diabetes mellitus (HCC)   . Vitamin D deficiency  disease    03/26/2010 - 9.9ng/mL. Started on replacemnt    Patient Active Problem List   Diagnosis Date Noted  . DNR (do not resuscitate) discussion 03/02/2016  . Palliative care by specialist 03/02/2016  . Acute encephalopathy   . Aspiration pneumonia of left lower lobe due to vomit (HCC)   . Essential hypertension   . Chronic diastolic CHF (congestive heart failure) (HCC)   . Acute on chronic respiratory failure with hypoxia and hypercapnia (HCC) 02/28/2016  . Acute LUQ pain 02/28/2016  . Sepsis (HCC) 11/25/2015  . Tobacco use 06/30/2015  . Protein-calorie malnutrition, severe 03/23/2015  . Slow transit constipation 01/07/2015  . Normocytic anemia 11/27/2014  . Physical deconditioning 07/01/2014  . Pulmonary infiltrate in left lung on chest x-ray 04/16/2014  . DM type 2 (diabetes mellitus, type 2) (HCC) 04/16/2014  . Vitamin D deficiency disease 04/16/2014  . Esophageal dysmotility 02/27/2014  . HCAP (healthcare-associated pneumonia) 02/21/2014  . Chronic diastolic congestive heart failure (HCC) 01/07/2014  . Acute on chronic respiratory failure (HCC) 01/03/2014  . COPD exacerbation (HCC) 12/09/2011  . HTN (hypertension) 05/12/2010  . Hyperlipidemia 05/12/2010  . Severe chronic obstructive pulmonary disease (HCC) 04/29/2010    Past Surgical History:  Procedure Laterality Date  . ABDOMINAL HYSTERECTOMY     "  partial"  . APPENDECTOMY  1960  . BACK SURGERY    . CARPAL TUNNEL RELEASE Bilateral    CTS repair 2000 (R), 2006 (L)/notes 01/01/2014  . CATARACT EXTRACTION W/ INTRAOCULAR LENS  IMPLANT, BILATERAL Bilateral 2015  . LUMBAR LAMINECTOMY/DECOMPRESSION MICRODISCECTOMY  2005   L4-5/notes 03/06/2010  . POSTERIOR LUMBAR FUSION  2002  . TONSILLECTOMY  1960    OB History    No data available       Home Medications    Prior to Admission medications   Medication Sig Start Date End Date Taking? Authorizing Provider  acetaminophen (TYLENOL) 500 MG tablet Take 500 mg by  mouth every 6 (six) hours as needed for mild pain.    Historical Provider, MD  albuterol (PROAIR HFA) 108 (90 Base) MCG/ACT inhaler Inhale 2 puffs into the lungs every 6 (six) hours as needed. 03/04/16   Drema Dallas, MD  albuterol (PROVENTIL) (2.5 MG/3ML) 0.083% nebulizer solution Take 3 mLs (2.5 mg total) by nebulization every 4 (four) hours as needed for wheezing or shortness of breath. (may take 1 every 4 hours as needed for wheezing/shortness of breath) DX: 496 03/04/16   Drema Dallas, MD  ALPRAZolam Prudy Feeler) 0.5 MG tablet Take 1 tablet (0.5 mg total) by mouth 2 (two) times daily as needed for anxiety. 03/04/16   Drema Dallas, MD  ALPRAZolam Prudy Feeler) 1 MG tablet Take 1 tablet (1 mg total) by mouth at bedtime. 03/04/16   Drema Dallas, MD  amoxicillin-clavulanate (AUGMENTIN) 875-125 MG tablet Take 1 tablet by mouth every 12 (twelve) hours. 03/04/16   Drema Dallas, MD  aspirin EC 81 MG EC tablet Take 1 tablet (81 mg total) by mouth daily. 01/09/14   [name], MD  atorvastatin (LIPITOR) 20 MG tablet Take 1 tablet (20 mg total) by mouth daily. 03/04/16   Drema Dallas, MD  budesonide-formoterol [facility]) 160-4.5 MCG/ACT inhaler Inhale 2 puffs into the lungs 2 (two) times daily. 03/04/16   Drema Dallas, MD  Dextromethorphan-Guaifenesin (MUCINEX DM MAXIMUM STRENGTH) 60-1200 MG TB12 Take 1 tablet by mouth every 12 (twelve) hours. 03/04/16   Drema Dallas, MD  feeding supplement, GLUCERNA SHAKE, (GLUCERNA SHAKE) LIQD Take 237 mLs by mouth 3 (three) times daily between meals. 03/04/16   Drema Dallas, MD  fluticasone (FLONASE) 50 MCG/ACT nasal spray Place 1 spray into both nostrils 2 (two) times daily. 03/04/16   Drema Dallas, MD  HYDROcodone-acetaminophen (NORCO/VICODIN) 5-325 MG tablet Take 1 tablet by mouth every 4 (four) hours as needed for moderate pain. 03/04/16   Drema Dallas, MD  hydroxypropyl methylcellulose / hypromellose (ISOPTO TEARS / GONIOVISC) 2.5 % ophthalmic solution Place 1 drop into both  eyes 3 (three) times daily as needed for dry eyes. 03/04/16   Drema Dallas, MD  ipratropium-albuterol (DUONEB) 0.5-2.5 (3) MG/3ML SOLN Take 3 mLs by nebulization 4 (four) times daily. 03/04/16   Drema Dallas, MD  latanoprost (XALATAN) 0.005 % ophthalmic solution Place 1 drop into both eyes at bedtime. 03/04/16   Drema Dallas, MD  Multiple Vitamins-Minerals (MULTIVITAMIN WITH MINERALS) tablet Take 1 tablet by mouth daily.    Historical Provider, MD  OXYGEN Inhale 2 L into the lungs continuous.    Historical Provider, MD  predniSONE (DELTASONE) 5 MG tablet Take 1 tablet (5 mg total) by mouth daily with breakfast. 03/05/16   Drema Dallas, MD  QUEtiapine (SEROQUEL) 25 MG tablet Take 0.5 tablets (12.5 mg total) by mouth  at bedtime. 03/04/16   Drema Dallasurtis J Woods, MD    Family History Family History  Problem Relation Age of Onset  . Heart attack Sister 2562    CABG  . Breast cancer Sister   . Cancer Brother   . Heart attack Brother   . Hypertension Other     all siblings  . Anesthesia problems Neg Hx     Social History Social History  Substance Use Topics  . Smoking status: Current Some Day Smoker    Packs/day: 0.25    Years: 56.00    Types: Cigarettes  . Smokeless tobacco: Never Used     Comment: 5 cigs per week/10.9.17  . Alcohol use 0.0 oz/week     Comment: h/o heavy ETOH quit 2004     Allergies   Brovana [arformoterol tartrate]; Budesonide; and Mucomyst [acetylcysteine]   Review of Systems Review of Systems  Constitutional: Negative for chills.  Respiratory: Positive for cough. Negative for shortness of breath.   Neurological: Positive for syncope.  All other systems reviewed and are negative.    Physical Exam Updated Vital Signs BP (!) 127/49 (BP Location: Left Arm)   Pulse 97   Temp 98.5 F (36.9 C) (Oral)   Resp (!) 22   SpO2 99%   Physical Exam  Constitutional: She is oriented to person, place, and time. She appears well-developed and well-nourished.  Somnolent  but arousable.  HENT:  Head: Normocephalic and atraumatic.  Eyes: EOM are normal. Pupils are equal, round, and reactive to light.  Neck: Normal range of motion. Neck supple. No JVD present.  Cardiovascular: Normal rate, regular rhythm and normal heart sounds.   No murmur heard. Pulmonary/Chest: Effort normal and breath sounds normal. She has no wheezes. She has no rales. She exhibits no tenderness.  Bibasilar rales. Course expiratory rhonchi.  Abdominal: Soft. Bowel sounds are normal. She exhibits no distension and no mass. There is no tenderness.  Musculoskeletal: Normal range of motion. She exhibits no edema.  Lymphadenopathy:    She has no cervical adenopathy.  Neurological: She is alert and oriented to person, place, and time. No cranial nerve deficit. She exhibits normal muscle tone. Coordination normal.  Speech is slow and moderately dysarthric.  Skin: Skin is warm and dry. No rash noted.  Psychiatric: She has a normal mood and affect. Her behavior is normal. Judgment and thought content normal.  Nursing note and vitals reviewed.    ED Treatments / Results  DIAGNOSTIC STUDIES: Oxygen Saturation is 96% on [place], adequate by my interpretation.   COORDINATION OF CARE: 12:25 AM-Discussed next steps with pt. Pt verbalized understanding and is agreeable with the plan.    Labs (all labs ordered are listed, but only abnormal results are displayed) Labs Reviewed  BASIC METABOLIC PANEL - Abnormal; Notable for the following:       Result Value   CO2 33 (*)    Glucose, Bld 180 (*)    BUN 36 (*)    Calcium 8.6 (*)    All other components within normal limits  CBC WITH DIFFERENTIAL/PLATELET - Abnormal; Notable for the following:    WBC 27.1 (*)    RBC 3.08 (*)    Hemoglobin 6.9 (*)    HCT 24.5 (*)    MCH 22.4 (*)    MCHC 28.2 (*)    RDW 19.4 (*)    Platelets 412 (*)    Neutro Abs 23.5 (*)    Monocytes Absolute 1.4 (*)    All other  components within normal limits  BRAIN  NATRIURETIC PEPTIDE - Abnormal; Notable for the following:    B Natriuretic Peptide 175.2 (*)    All other components within normal limits  BLOOD GAS, ARTERIAL - Abnormal; Notable for the following:    pH, Arterial 7.223 (*)    pCO2 arterial 79.8 (*)    pO2, Arterial 79.0 (*)    Bicarbonate 31.7 (*)    Acid-Base Excess 3.9 (*)    All other components within normal limits  URINALYSIS, ROUTINE W REFLEX MICROSCOPIC  I-STAT TROPOININ, ED  POC OCCULT BLOOD, ED  TYPE AND SCREEN  PREPARE RBC (CROSSMATCH)    Radiology Dg Chest Port 1 View  Result Date: 04/11/2016 CLINICAL DATA:  Chest pain EXAM: PORTABLE CHEST 1 VIEW COMPARISON:  Chest radiograph 02/29/2016 FINDINGS: Slightly improved aeration of the left lower lobe. Small amount of right basilar atelectasis. Cardiomediastinal silhouette is unchanged with persistent calcific aortic atherosclerosis. No pneumothorax or pleural effusion. IMPRESSION: Improved aeration of the left lower lobe. Electronically Signed   By: Deatra Robinson M.D.   On: 04/11/2016 01:33    Procedures Procedures (including critical care time) CRITICAL CARE Performed by: ZOXWR,UEAVW Total critical care time: 50 minutes Critical care time was exclusive of separately billable procedures and treating other patients. Critical care was necessary to treat or prevent imminent or life-threatening deterioration. Critical care was time spent personally by me on the following activities: development of treatment plan with patient and/or surrogate as well as nursing, discussions with consultants, evaluation of patient's response to treatment, examination of patient, obtaining history from patient or surrogate, ordering and performing treatments and interventions, ordering and review of laboratory studies, ordering and review of radiographic studies, pulse oximetry and re-evaluation of patient's condition.  Medications Ordered in ED Medications - No data to display   Initial  Impression / Assessment and Plan / ED Course  I have reviewed the triage vital signs and the nursing notes.  Pertinent labs & imaging results that were available during my care of the patient were reviewed by me and considered in my medical decision making (see chart for details).  Patient with complaints of generalized pain. Old records are reviewed, and she was recently placed on hospice for end-stage chest COPD. She had a recent hospitalization for acute on chronic respiratory failure which required an extended stay on BiPAP. Chest x-ray in the ED shows some improvement in lung aeration and no definite infiltrates. Laboratory workup shows worsening anemia with hemoglobin dropping to 6.9 (had been 8.2 two months ago). There has been a rise in BUN with creatinine remaining stable. BNP is mildly elevated. Metabolic alkalosis is noted which is consistent with patient stone history of CO2 retention. Significant leukocytosis is present on, but she has had inconsistent leukocytosis and similar range.  Family has arrived and gave significant information. Patient has been confused since yesterday. They've clarified that she does not wish to be intubated and does not want CPR done. She is willing to accept blood transfusions and BiPAP of. ABG is obtained showing significant respiratory acidosis, and BiPAP was initiated. Rectal exam was done and stool is noted to be Hemoccult negative. She's given a dose of methylprednisolone. Case is discussed with Dr. Antionette Char of [facility] who agrees to admit the patient.  Final Clinical Impressions(s) / ED Diagnoses   Final diagnoses:  Acute and chronic respiratory failure with hypercapnia (HCC)  Microcytic anemia  Elevated BUN  Leukocytosis, unspecified type    New Prescriptions New Prescriptions  No medications on file    I personally performed the services described in this documentation, which was scribed in my presence. The recorded information has been  reviewed and is accurate.      Dione Booze, MD 04/11/16 [phone]

## 2016-04-12 LAB — TYPE AND SCREEN
ABO/RH(D): A POS
Antibody Screen: NEGATIVE
Unit division: 0
Unit division: 0

## 2016-04-12 LAB — URINALYSIS, ROUTINE W REFLEX MICROSCOPIC
Bacteria, UA: NONE SEEN
Bilirubin Urine: NEGATIVE
GLUCOSE, UA: NEGATIVE mg/dL
HGB URINE DIPSTICK: NEGATIVE
Ketones, ur: NEGATIVE mg/dL
Leukocytes, UA: NEGATIVE
Nitrite: NEGATIVE
PH: 6 (ref 5.0–8.0)
Protein, ur: 30 mg/dL — AB
SPECIFIC GRAVITY, URINE: 1.013 (ref 1.005–1.030)

## 2016-04-12 LAB — BASIC METABOLIC PANEL
Anion gap: 9 (ref 5–15)
BUN: 32 mg/dL — ABNORMAL HIGH (ref 6–20)
CALCIUM: 9 mg/dL (ref 8.9–10.3)
CO2: 26 mmol/L (ref 22–32)
Chloride: 107 mmol/L (ref 101–111)
Creatinine, Ser: 0.83 mg/dL (ref 0.44–1.00)
GFR calc non Af Amer: >60 mL/min (ref >60)
Glucose, Bld: 178 mg/dL — ABNORMAL HIGH (ref 65–99)
Potassium: 5 mmol/L (ref 3.5–5.1)
SODIUM: 142 mmol/L (ref 135–145)

## 2016-04-12 LAB — CBC
HCT: 30.8 % — ABNORMAL LOW (ref 36.0–46.0)
Hemoglobin: 9.4 g/dL — ABNORMAL LOW (ref 12.0–15.0)
MCH: 23.7 pg — ABNORMAL LOW (ref 26.0–34.0)
MCHC: 30.5 g/dL (ref 30.0–36.0)
MCV: 77.8 fL — ABNORMAL LOW (ref 78.0–100.0)
PLATELETS: 370 10*3/uL (ref 150–400)
RBC: 3.96 MIL/uL (ref 3.87–5.11)
RDW: 18.3 % — AB (ref 11.5–15.5)
WBC: 22.3 10*3/uL — ABNORMAL HIGH (ref 4.0–10.5)

## 2016-04-12 LAB — EXPECTORATED SPUTUM ASSESSMENT W REFEX TO RESP CULTURE

## 2016-04-12 LAB — BPAM RBC
BLOOD PRODUCT EXPIRATION DATE: 201804012359
BLOOD PRODUCT EXPIRATION DATE: 201804012359
ISSUE DATE / TIME: 201803181803
ISSUE DATE / TIME: 201803180953
UNIT TYPE AND RH: 6200
Unit Type and Rh: 6200

## 2016-04-12 LAB — EXPECTORATED SPUTUM ASSESSMENT W GRAM STAIN, RFLX TO RESP C

## 2016-04-12 LAB — GLUCOSE, CAPILLARY
GLUCOSE-CAPILLARY: 137 mg/dL — AB (ref 65–99)
GLUCOSE-CAPILLARY: 182 mg/dL — AB (ref 65–99)
GLUCOSE-CAPILLARY: 174 mg/dL — AB (ref 65–99)
Glucose-Capillary: 162 mg/dL — ABNORMAL HIGH (ref 65–99)
Glucose-Capillary: 215 mg/dL — ABNORMAL HIGH (ref 65–99)

## 2016-04-12 MED ORDER — INSULIN ASPART 100 UNIT/ML ~~LOC~~ SOLN: 0.0000 [IU] | Freq: Three times a day (TID) | SUBCUTANEOUS | Status: DC

## 2016-04-12 MED ORDER — HYDRALAZINE HCL 20 MG/ML IJ SOLN
10.0000 mg | INTRAMUSCULAR | Status: DC | PRN
  Administered 2016-04-12 – 2016-04-13 (×3): 10 mg via INTRAVENOUS
  Filled 2016-04-12 (×3): qty 1

## 2016-04-12 MED ORDER — AMLODIPINE BESYLATE 5 MG PO TABS
5.0000 mg | ORAL_TABLET | Freq: Every day | ORAL | Status: DC
  Administered 2016-04-12 – 2016-04-13 (×2): 5 mg via ORAL
  Filled 2016-04-12: qty 1

## 2016-04-12 MED ORDER — INSULIN ASPART 100 UNIT/ML ~~LOC~~ SOLN
0.0000 [IU] | Freq: Three times a day (TID) | SUBCUTANEOUS | Status: DC
  Administered 2016-04-12: 2 [IU] via SUBCUTANEOUS
  Administered 2016-04-12: 3 [IU] via SUBCUTANEOUS
  Administered 2016-04-13 – 2016-04-14 (×3): 1 [IU] via SUBCUTANEOUS

## 2016-04-12 MED ORDER — LEVOFLOXACIN 750 MG PO TABS
750.0000 mg | ORAL_TABLET | ORAL | Status: DC
  Administered 2016-04-13: 750 mg via ORAL
  Filled 2016-04-12: qty 1

## 2016-04-12 MED ORDER — METHYLPREDNISOLONE SODIUM SUCC 125 MG IJ SOLR
60.0000 mg | Freq: Two times a day (BID) | INTRAMUSCULAR | Status: AC
  Administered 2016-04-12 – 2016-04-13 (×3): 60 mg via INTRAVENOUS
  Filled 2016-04-12 (×3): qty 2

## 2016-04-12 MED ORDER — LIP MEDEX EX OINT
TOPICAL_OINTMENT | CUTANEOUS | Status: DC | PRN
  Filled 2016-04-12: qty 7

## 2016-04-12 MED ORDER — PREMIER PROTEIN SHAKE
11.0000 [oz_av] | Freq: Two times a day (BID) | ORAL | Status: DC
  Administered 2016-04-12 – 2016-04-13 (×2): 11 [oz_av] via ORAL
  Filled 2016-04-12 (×5): qty 325.31

## 2016-04-12 MED ORDER — TUBERCULIN PPD 5 UNIT/0.1ML ID SOLN
5.0000 [IU] | Freq: Once | INTRADERMAL | Status: AC
  Administered 2016-04-12: 5 [IU] via INTRADERMAL
  Filled 2016-04-12: qty 0.1

## 2016-04-12 MED ORDER — ALPRAZOLAM 0.5 MG PO TABS
0.5000 mg | ORAL_TABLET | Freq: Three times a day (TID) | ORAL | Status: DC | PRN
  Administered 2016-04-12 – 2016-04-14 (×6): 0.5 mg via ORAL
  Filled 2016-04-12 (×8): qty 1

## 2016-04-12 NOTE — Clinical Social Work Note (Signed)
Clinical Social Work Assessment  Patient Details  Name: EVYN KOOYMAN MRN: [id number] Date of Birth: 1945/07/26  Date of referral:  04/12/16               Reason for consult:  Discharge Planning                Permission sought to share information with:  Chartered certified accountant granted to share information::  Yes, Verbal Permission Granted  Name::        Agency::     Relationship::     Contact Information:     Housing/Transportation Living arrangements for the past 2 months:  Single Family Home Source of Information:  Adult Children, Other (Comment Required) (hospice worker, ALF Administrator [place]) Patient Interpreter Needed:  None Criminal Activity/Legal Involvement Pertinent to Current Situation/Hospitalization:  No - Comment as needed Significant Relationships:  Adult Children Lives with:  Self Do you feel safe going back to the place where you live?  No (Placement needed.) Need for family participation in patient care:  Yes (Comment)  Care giving concerns:  Pt's care cannot be managed at home following hospital d/c.   Social Worker assessment / plan:  Pt hospitalized from home on 04/11/16 with COPD with acute exacerbation, acute on chronic respiratory failure with hypoxia and hypercarbia. CSW consulted to assist with d/c planning. CSW was unable to meet with pt due to care being provided. CSW spoke with Webb Silversmith from [facility], pt's daughter, Verdis Frederickson and Ms. Paramedic from [facility]. CSW informed that pt was planning admission to [facility] prior to hospitalization. Clinicals sent to ALF for review. Pt / family / hospice would like to pursue ALF. Ms. Shanda Bumps will review clinicals to see if needs can be met at ALF. CSW will keep in contact with ALF and continue to follow to assist with d/c planning.   Employment status:  Retired Nurse, adult PT Recommendations:  [place] / Referral to community  resources:     Patient/Family's Response to care:  Pt / family are requesting ALF at d/c/.  Patient/Family's Understanding of and Emotional Response to Diagnosis, Current Treatment, and Prognosis:  Daughter / Hospice worker report that pt did not have a good experience at SNF and does not want to return. Pt toured [facility] last week and was pleased with facility. Pt / family / hospice worker are all hopeful that ALF will accept pt at d/c.  Emotional Assessment Appearance:  Appears stated age Attitude/Demeanor/Rapport:  Unable to Assess Affect (typically observed):  Unable to Assess Orientation:  Oriented to Self, Oriented to Place, Oriented to  Time, Oriented to Situation Alcohol / Substance use:  Not Applicable Psych involvement (Current and /or in the community):  No (Comment)  Discharge Needs  Concerns to be addressed:  Discharge Planning Concerns Readmission within the last 30 days:  Yes Current discharge risk:  None Barriers to Discharge:  No Barriers Identified   [name]  [phone] 04/12/2016, 3:36 PM

## 2016-04-12 NOTE — Progress Notes (Signed)
Chart reviewed and d/w primary attending.  As pain well controlled and goals clear, PMT to sign off at this time.    Please reconsult if we can be of further assistance in the care of Nicole Maxwell.  Romie MinusGene Illene Sweeting, MD [facility] Palliative Medicine Team [phone]

## 2016-04-12 NOTE — Progress Notes (Addendum)
[facility] RN Visit @ 8AM  This is a related, covered GIP admission on 04/11/16, per MD Monguilod.  Patient was admitted to Hospice on 02/17/16 with COPD.  Patient has an OOF DNR in the home. Code status noted in hospital as partial/DNI - no intubation, chest compressions, or defibrillation. She was admitted on 04/11/16 early AM hours with COPD exacerbation and altered mental status (AMS).  She presented with the complaint that she had passed out.  She also noted productive cough and increased shortness of breath (SOB).  She noted that for (2) days, she had progressive somnolence, confusion, SOB, and multiple falls.  Her family stated that she had sustained (4) falls in (2) days with one fall noting that she hit her head on the floor.  She was admitted for treatment of COPD exacerbation.  Visited with patient in the room.  No family/visitors present at time of visit.  Patient alert and oriented, and sitting up in bed drinking coffee.  She reported wearing BiPap overnight, however, was not able to sleep d/t the discomfort of the mask.  She is on 2L  this morning with normal O2 sats and respirations.  She ate approximately 50% of her breakfast.  She reported working with PT last night and was able to ambulate 150 feet with minimal assist. Patient is receiving scheduled nebulizer treatments and IV Levofloxacin. She is also receiving Solu-Medrol 60 mg Q 6 hours IV. She has a non-productive cough.  She received 650 mg Tylenol po for pain this morning.  She rated her pain  8/10 and staff RN aware.  She received 1 dose of 0.5 mg Xanax po for anxiety last night.  She also received 3 doses of 12.5 mcg of Fentanyl for pain and 0.5 mg of Ativan for anxiety overnight.  Spoke to hospice SW, Albin Fellingnne Batten re: discharge planning.  Per discussion, patient and family is [facility].  Anne to follow-up and assist as needed.  Patient  denies any hospice-related questions or concerns.  [facility] will continue to follow daily and anticipate any discharge needs.  Left contact information with patient, if any needs arise.  Please feel free to contact with any questions.  Thank you, Hessie KnowsStacie Wilkinson RN, BSN [facility] Hospital Liaison [phone]  [facility] hospital liaisons now available on AMION.

## 2016-04-12 NOTE — Progress Notes (Signed)
PHARMACIST - PHYSICIAN COMMUNICATION CONCERNING: Antibiotic IV to Oral Route Change Policy  RECOMMENDATION: This patient is receiving Levaquin by the intravenous route.  Based on criteria approved by the Pharmacy and Therapeutics Committee, the antibiotic(s) is/are being converted to the equivalent oral dose form(s).   DESCRIPTION: These criteria include:  Patient being treated for a respiratory tract infection, urinary tract infection, cellulitis or clostridium difficile associated diarrhea if on metronidazole  The patient is not neutropenic and does not exhibit a GI malabsorption state  The patient is eating (either orally or via tube) and/or has been taking other orally administered medications for a least 24 hours  The patient is improving clinically and has a Tmax < 100.5  If you have questions about this conversion, please contact the Pharmacy Department  []   [phone]( [phone] )  [facility] []   [phone]( [phone] )  [facility] []   [phone]( [phone] )  [facility] []   [phone]( [phone] )  [facility] [x]   [phone]( [phone] )  [facility]   Clance BollAmanda Dustyn Dansereau, PharmD, [place] Pager: [phone] 04/12/2016 2:14 PM

## 2016-04-12 NOTE — Progress Notes (Signed)
PROGRESS NOTE                                                                                                                                                                                                             Patient Demographics:    Nicole Maxwell, is a 71 y.o. female, DOB - 11-09-1945, ZOX:[id number]  Admit date - 04/11/2016   Admitting Physician Briscoe Deutscher, MD  Outpatient Primary MD for the patient is [name], MD  LOS - 1   Chief Complaint  Patient presents with  . COPD  . Altered Mental Status       Brief Narrative   71 y.o. female with medical history significant for COPD with chronic hypercarbic respiratory failure, anemia of chronic disease, anxiety, depression, and chronic low back pain on home hospice who presents to the emergency department with 2 days of progressive somnolence, confusion, apparent respiratory distress, and recurrent falls, workup significant for acute on chronic hypoxic/hypercarbic respiratory failure with COPD exacerbation, patient is DO NOT INTUBATE.   Subjective:    Nicole Maxwell today Denies any complaints, no worsening dyspnea, complains of cough which is chronic, nonproductive    Assessment  & Plan :    Principal Problem:   COPD exacerbation (HCC) Active Problems:   Severe chronic obstructive pulmonary disease (HCC)   HTN (hypertension)   Chronic diastolic congestive heart failure (HCC)   DM type 2 (diabetes mellitus, type 2) (HCC)   Normocytic anemia   Acute encephalopathy   Essential hypertension   Symptomatic anemia   COPD with acute exacerbation (HCC)   Acute and chronic respiratory failure with hypercapnia (HCC)   COPD with acute exacerbation, acute on chronic respiratory failure with hypoxia and hypercarbia  - Pt has severe chronic COPD on 2 Lpm chronically, BiPAP at night, and chronic prednisone  - She presents somnolent, in respiratory distress, and with pH 7.22, pCO2 80, pO2 79    - CXR features a chronic LLL opacity with improved aeration from prior  - P CO2 appears to be improving on BiPAP, continue  DuoNeb's, Dulera,, and IV levofloxacin . Appears to be improving, so we'll start tapering IV steroids. - No requirement of BiPAP daytime yesterday, she is on BiPAP daily at bedtime at baseline. - Leukocytosis secondary to steroids   Symptomatic RN deficiency anemia  - Pt  presents with somnolence, attributable to hypercarbia, but with Hgb only 6.9 and likely contributing  - No melena or hematochezia reported and FOBT is negative  - Hgb had been ranging from 7.6 to 8.6 in February 2018 - Disposed units PRBC, hemoglobin improved at 9.4 today. - Check post-transfusion CBC   Acute encephalopathy  - Likely secondary to hypercarbia , CT head with no acute findings, back to baseline  Chronic diastolic CHF  - Appears dry on admission, wt stable   - TTE (07/06/15) with EF 50-55%, grade 1 diastolic dysfunction, no WMA or significant valvular disease noted  - Managed at home with Lasix 20 mg qD, held on admission  - follow daily wts and I/O's   Anxiety, depression - resume home meds  Hyperglycemia  - Serum glucose 180 on admission, likely secondary to steriods  - A1c 6.0% in February 2018      Code Status : Partial/DNI  Family Communication  : None at bedside  Disposition Plan  : Pending further work up.  Consults  :  None  Procedures  : None   DVT Prophylaxis  :  SCDs  Lab Results  Component Value Date   PLT 370 04/12/2016    Antibiotics  :    Anti-infectives    Start     Dose/Rate Route Frequency Ordered Stop   04/13/16 0600  levofloxacin (LEVAQUIN) IVPB 750 mg     750 mg 100 mL/hr over 90 Minutes Intravenous Every 48 hours 04/11/16 0453     04/11/16 0500  levofloxacin (LEVAQUIN) IVPB 750 mg     750 mg 100 mL/hr over 90 Minutes Intravenous NOW 04/11/16 0429 04/11/16 0724        Objective:   Vitals:   04/11/16 2339 04/12/16 0311  04/12/16 0426 04/12/16 0800  BP:    (!) 152/123  Pulse:   89 94  Resp:   (!) 22 19  Temp: 98 F (36.7 C) 98.2 F (36.8 C)  98.8 F (37.1 C)  TempSrc: Axillary Axillary  Oral  SpO2:   98% 96%  Weight:      Height:        Wt Readings from Last 3 Encounters:  04/11/16 57 kg (125 lb 10.6 oz)  03/19/16 57.5 kg (126 lb 12.8 oz)  03/04/16 55.3 kg (121 lb 14.6 oz)     Intake/Output Summary (Last 24 hours) at 04/12/16 1140 Last data filed at 04/12/16 1000  Gross per 24 hour  Intake              910 ml  Output                0 ml  Net              910 ml     Physical Exam  Awake, Alert 3 Supple Neck,No JVD,  Symmetrical Chest wall movement, fair air movement bilaterally, No wheezing RRR,No Gallops,Rubs or new Murmurs, No Parasternal Heave +ve B.Sounds, Abd Soft, No tenderness,No rebound - guarding or rigidity. No Cyanosis, Clubbing or edema, No new Rash or bruise     Data Review:    CBC  Recent Labs Lab 04/11/16 0028 04/12/16 0336  WBC 27.1* 22.3*  HGB 6.9* 9.4*  HCT 24.5* 30.8*  PLT 412* 370  MCV 79.5 77.8*  MCH 22.4* 23.7*  MCHC 28.2* 30.5  RDW 19.4* 18.3*  LYMPHSABS 2.2  --   MONOABS 1.4*  --   EOSABS 0.0  --   BASOSABS 0.0  --  Chemistries   Recent Labs Lab 04/11/16 0028 04/12/16 0336  NA 143 142  K 4.8 5.0  CL 105 107  CO2 33* 26  GLUCOSE 180* 178*  BUN 36* 32*  CREATININE 0.93 0.83  CALCIUM 8.6* 9.0   ------------------------------------------------------------------------------------------------------------------ No results for input(s): CHOL, HDL, LDLCALC, TRIG, CHOLHDL, LDLDIRECT in the last 72 hours.  Lab Results  Component Value Date   HGBA1C 6.0 (H) 02/28/2016   ------------------------------------------------------------------------------------------------------------------ No results for input(s): TSH, T4TOTAL, T3FREE, THYROIDAB in the last 72 hours.  Invalid input(s):  FREET3 ------------------------------------------------------------------------------------------------------------------  Recent Labs  04/11/16 0643  VITAMINB12 526  FOLATE 20.1  FERRITIN 16  TIBC 358  IRON 6*  RETICCTPCT 1.2    Coagulation profile No results for input(s): INR, PROTIME in the last 168 hours.  No results for input(s): DDIMER in the last 72 hours.  Cardiac Enzymes No results for input(s): CKMB, TROPONINI, MYOGLOBIN in the last 168 hours.  Invalid input(s): CK ------------------------------------------------------------------------------------------------------------------    Component Value Date/Time   BNP 175.2 (H) 04/11/2016 0028    Inpatient Medications  Scheduled Meds: . dextromethorphan-guaiFENesin  1 tablet Oral Daily  . fluticasone  1 spray Each Nare BID  . gabapentin  300 mg Oral BID  . ipratropium-albuterol  3 mL Nebulization Q6H  . latanoprost  1 drop Both Eyes QHS  . [START ON 04/13/2016] levofloxacin (LEVAQUIN) IV  750 mg Intravenous Q48H  . methylPREDNISolone (SOLU-MEDROL) injection  60 mg Intravenous Q6H  . mometasone-formoterol  2 puff Inhalation BID  . protein supplement shake  11 oz Oral BID BM  . QUEtiapine  12.5 mg Oral QHS  . sodium chloride flush  3 mL Intravenous Q12H   Continuous Infusions: PRN Meds:.acetaminophen, ALPRAZolam, bisacodyl, ipratropium-albuterol, lip balm, ondansetron **OR** ondansetron (ZOFRAN) IV, polyethylene glycol, polyvinyl alcohol  Micro Results Recent Results (from the past 240 hour(s))  MRSA PCR Screening     Status: None   Collection Time: 04/11/16  7:05 AM  Result Value Ref Range Status   MRSA by PCR NEGATIVE NEGATIVE Final    Comment:        The GeneXpert MRSA Assay (FDA approved for NASAL specimens only), is one component of a comprehensive MRSA colonization surveillance program. It is not intended to diagnose MRSA infection nor to guide or monitor treatment for MRSA infections.   Culture,  expectorated sputum-assessment     Status: None   Collection Time: 04/12/16  9:00 AM  Result Value Ref Range Status   Specimen Description EXPECTORATED SPUTUM  Final   Special Requests Immunocompromised  Final   Sputum evaluation THIS SPECIMEN IS ACCEPTABLE FOR SPUTUM CULTURE  Final   Report Status 04/12/2016 FINAL  Final    Radiology Reports Ct Head Wo Contrast  Result Date: 04/11/2016 CLINICAL DATA:  Falling and dizziness.  Short of breath EXAM: CT HEAD WITHOUT CONTRAST TECHNIQUE: Contiguous axial images were obtained from the base of the skull through the vertex without intravenous contrast. COMPARISON:  None. FINDINGS: Brain: No acute intracranial hemorrhage. No focal mass lesion. No CT evidence of acute infarction. No midline shift or mass effect. No hydrocephalus. Basilar cisterns are patent. There are periventricular and subcortical white matter hypodensities. Generalized cortical atrophy. There had rest. Vascular: No hyperdense vessel or unexpected calcification. Skull: Normal. Negative for fracture or focal lesion. Sinuses/Orbits: Paranasal sinuses and mastoid air cells are clear. Orbits are clear. Other: None. IMPRESSION: 1. No acute intracranial findings. 2. Atrophy and white matter microvascular disease Electronically Signed   By: Roseanne RenoStewart  Amil Amen M.D.   On: 04/11/2016 17:22   Dg Chest Port 1 View  Result Date: 04/11/2016 CLINICAL DATA:  Chest pain EXAM: PORTABLE CHEST 1 VIEW COMPARISON:  Chest radiograph 02/29/2016 FINDINGS: Slightly improved aeration of the left lower lobe. Small amount of right basilar atelectasis. Cardiomediastinal silhouette is unchanged with persistent calcific aortic atherosclerosis. No pneumothorax or pleural effusion. IMPRESSION: Improved aeration of the left lower lobe. Electronically Signed   By: Deatra Robinson M.D.   On: 04/11/2016 01:33     Baileigh Modisette M.D on 04/12/2016 at 11:40 AM  Between 7am to 7pm - Pager - [phone]  After 7pm go to  [email or web address] - password [facility]  [facility] -  Office  [phone]

## 2016-04-12 NOTE — Evaluation (Signed)
Occupational Therapy Evaluation Patient Details Name: Nicole Maxwell MRN: [id number] DOB: [birth year] Today's Date: 04/12/2016    History of Present Illness Pt admitted through ed with SOB, resp distress, AMS and frequent falls.  Pt with hx of DM, PVD, COPD, chronic anemia and ETOH abuse.   Clinical Impression   Pt admitted with fall and AMS.  Pt currently with functional limitations due to the deficits listed below (see OT Problem List). Pt will benefit from skilled OT to increase their safety and independence with ADL and functional mobility for ADL to facilitate discharge to venue listed below.      Follow Up Recommendations  Home health OT;Other (comment) (at ALF)    Equipment Recommendations  None recommended by OT    Recommendations for Other Services       Precautions / Restrictions Precautions Precautions: Fall Restrictions Weight Bearing Restrictions: No      Mobility Bed Mobility               General bed mobility comments: Pt OOB  Transfers Overall transfer level: Needs assistance Equipment used: 1 person hand held assist Transfers: Sit to/from Stand;Stand Pivot Transfers Sit to Stand: Min assist Stand pivot transfers: Min assist       General transfer comment: cues for safe transition position and use of UEs to self assist         ADL Overall ADL's : Needs assistance/impaired Eating/Feeding: Set up   Grooming: Set up;Sitting   Upper Body Bathing: Set up;Sitting   Lower Body Bathing: Moderate assistance;Sit to/from stand;Cueing for safety;Cueing for sequencing;Cueing for compensatory techniques   Upper Body Dressing : Set up;Sitting   Lower Body Dressing: Moderate assistance;Cueing for safety;Cueing for sequencing;Cueing for compensatory techniques   Toilet Transfer: Minimal assistance;Squat-pivot;BSC;Cueing for safety;Cueing for sequencing   Toileting- Clothing Manipulation and Hygiene: Minimal assistance;Sit to/from stand;Cueing for  safety;Cueing for sequencing         General ADL Comments: Pt is very impulsive and needs VC for safety during transfers and transitions            Extremity/Trunk Assessment         Cervical / Trunk Assessment Cervical / Trunk Assessment: Kyphotic   Communication     Cognition Arousal/Alertness: Awake/alert Behavior During Therapy: Impulsive;WFL for tasks assessed/performed Overall Cognitive Status: No family/caregiver present to determine baseline cognitive functioning                                Home Living Family/patient expects to be discharged to:: Assisted living                                                 OT Problem List: Decreased strength;Decreased activity tolerance;Impaired UE functional use      OT Treatment/Interventions: Self-care/ADL training;DME and/or AE instruction;Patient/family education    OT Goals(Current goals can be found in the care plan section) Acute Rehab OT Goals Patient Stated Goal: Go home OT Goal Formulation: With patient Time For Goal Achievement: 04/19/16 Potential to Achieve Goals: Good  OT Frequency: Min 2X/week              End of Session Nurse Communication: Mobility status  Activity Tolerance: Patient tolerated treatment well Patient left: in chair;with call bell/phone within reach;with chair alarm set;with family/visitor present  OT Visit Diagnosis: Unsteadiness on feet (R26.81);Muscle weakness (generalized) (M62.81);Repeated falls (R29.6)                ADL either performed or assessed with clinical judgement  Time: 1610-9604 OT Time Calculation (min): 14 min Charges:  OT General Charges $OT Visit: 1 Procedure OT Evaluation $OT Eval Moderate Complexity: 1 Procedure G-Codes:     Lise Auer, OT [phone]  Einar Crow D 04/12/2016, 5:16 PM

## 2016-04-12 NOTE — Care Management Note (Signed)
Case Management Note  Patient Details  Name: Nicole Maxwell MRN: [id number] Date of Birth: 12-31-1945  Subjective/Objective:     Copd, ams/required bipap now to [name]/wbc 22.3/iv flds and iv levoquin.               Action/Plan:  From home Date:  April 12, 2016 Chart reviewed for concurrent status and case management needs. Will continue to follow patient progress. Discharge Planning: following for needs Expected discharge date: [id number] Marcelle SmilingRhonda Colbert Curenton, BSN, [place]3, [place]   [phone]   Expected Discharge Date:   (unknown)               Expected Discharge Plan:  Home/Self Care  In-House Referral:     Discharge planning Services     Post Acute Care Choice:    Choice offered to:     DME Arranged:    DME Agency:     HH Arranged:    HH Agency:     Status of Service:  In process, will continue to follow  If discussed at Long Length of Stay Meetings, dates discussed:    Additional Comments:  Golda AcreDavis, Romanda Turrubiates Lynn, RN 04/12/2016, 10:38 AM

## 2016-04-12 NOTE — Progress Notes (Signed)
Nutrition Brief Note  Patient identified by consult per COPD protocol.  Wt Readings from Last 15 Encounters:  04/11/16 125 lb 10.6 oz (57 kg)  03/19/16 126 lb 12.8 oz (57.5 kg)  03/04/16 121 lb 14.6 oz (55.3 kg)  12/15/15 119 lb 11.2 oz (54.3 kg)  12/02/15 119 lb 11.2 oz (54.3 kg)  12/01/15 119 lb 11.2 oz (54.3 kg)  11/27/15 119 lb 11.2 oz (54.3 kg)  11/03/15 115 lb (52.2 kg)  10/24/15 115 lb 11.2 oz (52.5 kg)  09/10/15 113 lb (51.3 kg)  08/01/15 112 lb (50.8 kg)  07/06/15 115 lb 9.6 oz (52.4 kg)  04/28/15 123 lb (55.8 kg)  04/11/15 111 lb (50.3 kg)  04/08/15 123 lb (55.8 kg)    Body mass index is 22.98 kg/m. Patient meets criteria for normal weight based on current BMI. Skin WDL.   Current diet order is Carb Modified; changed from Dysphagia 3, thin liquids at 1040 this AM. RN reports that pt consumed ~30% of breakfast prior to change in diet. RN reports that pt seems to have some confusion at this time and is not a good historian. Per notes, pt was admitted to hospice ([facility]) on 02/17/16.  Medications reviewed; sliding scale Novolog, 60 mg IV Solu-medrol QID.  Labs reviewed; CBGs: 137 and 162 mg/dL this AM.  Will order Premier Protein BID, each supplement provides 160 kcal and 30 grams of protein for times when pt not eating at least 50% of meals. No further nutrition interventions warranted at this time. If nutrition issues arise, please consult RD.     Trenton GammonJessica Delesa Kawa, MS, RD, LDN, [facility] Inpatient Clinical Dietitian Pager # [phone] After hours/weekend pager # [phone]

## 2016-04-13 DIAGNOSIS — I10 Essential (primary) hypertension: Secondary | ICD-10-CM

## 2016-04-13 LAB — BASIC METABOLIC PANEL
Anion gap: 6 (ref 5–15)
BUN: 38 mg/dL — ABNORMAL HIGH (ref 6–20)
CALCIUM: 9 mg/dL (ref 8.9–10.3)
CHLORIDE: 109 mmol/L (ref 101–111)
CO2: 30 mmol/L (ref 22–32)
CREATININE: 0.79 mg/dL (ref 0.44–1.00)
GFR calc non Af Amer: >60 mL/min (ref >60)
Glucose, Bld: 151 mg/dL — ABNORMAL HIGH (ref 65–99)
Potassium: 4.4 mmol/L (ref 3.5–5.1)
SODIUM: 145 mmol/L (ref 135–145)

## 2016-04-13 LAB — CBC
HCT: 32.8 % — ABNORMAL LOW (ref 36.0–46.0)
HEMOGLOBIN: 9.7 g/dL — AB (ref 12.0–15.0)
MCH: 22.8 pg — ABNORMAL LOW (ref 26.0–34.0)
MCHC: 29.6 g/dL — AB (ref 30.0–36.0)
MCV: 77 fL — ABNORMAL LOW (ref 78.0–100.0)
Platelets: 387 10*3/uL (ref 150–400)
RBC: 4.26 MIL/uL (ref 3.87–5.11)
RDW: 19 % — ABNORMAL HIGH (ref 11.5–15.5)
WBC: 16.2 10*3/uL — AB (ref 4.0–10.5)

## 2016-04-13 LAB — GLUCOSE, CAPILLARY
GLUCOSE-CAPILLARY: 99 mg/dL (ref 65–99)
Glucose-Capillary: 149 mg/dL — ABNORMAL HIGH (ref 65–99)
Glucose-Capillary: 127 mg/dL — ABNORMAL HIGH (ref 65–99)
Glucose-Capillary: 192 mg/dL — ABNORMAL HIGH (ref 65–99)

## 2016-04-13 MED ORDER — AMLODIPINE BESYLATE 10 MG PO TABS
10.0000 mg | ORAL_TABLET | Freq: Every day | ORAL | Status: DC
  Administered 2016-04-14: 10 mg via ORAL
  Filled 2016-04-13: qty 1

## 2016-04-13 MED ORDER — NICOTINE 7 MG/24HR TD PT24
7.0000 mg | MEDICATED_PATCH | Freq: Every day | TRANSDERMAL | Status: DC
Start: 2016-04-13 — End: 2016-04-15
  Administered 2016-04-13 (×2): 7 mg via TRANSDERMAL
  Filled 2016-04-13 (×2): qty 1

## 2016-04-13 MED ORDER — PREDNISONE 20 MG PO TABS
40.0000 mg | ORAL_TABLET | Freq: Every day | ORAL | Status: DC
  Administered 2016-04-14: 40 mg via ORAL
  Filled 2016-04-13: qty 2

## 2016-04-13 MED ORDER — PANTOPRAZOLE SODIUM 40 MG PO TBEC
40.0000 mg | DELAYED_RELEASE_TABLET | Freq: Every day | ORAL | Status: DC
  Administered 2016-04-13 – 2016-04-14 (×2): 40 mg via ORAL
  Filled 2016-04-13 (×2): qty 1

## 2016-04-13 MED ORDER — AMLODIPINE BESYLATE 5 MG PO TABS
5.0000 mg | ORAL_TABLET | Freq: Once | ORAL | Status: AC
  Administered 2016-04-13: 5 mg via ORAL
  Filled 2016-04-13: qty 1

## 2016-04-13 MED ORDER — FUROSEMIDE 20 MG PO TABS
20.0000 mg | ORAL_TABLET | Freq: Every day | ORAL | Status: DC
  Administered 2016-04-14: 20 mg via ORAL
  Filled 2016-04-13: qty 1

## 2016-04-13 NOTE — Progress Notes (Signed)
Physical Therapy Treatment Patient Details Name: Nicole Maxwell MRN: [id number] DOB: 04-17-1945 Today's Date: 04/13/2016    History of Present Illness Pt admitted through ed with SOB, resp distress, AMS and frequent falls.  Pt with hx of DM, PVD, COPD, chronic anemia and ETOH abuse.    PT Comments    Patient is confused and restless and agitated, trying to get OOB. Redirected multiple times with patient not accepting. Thinks she's going home today. Continue PT./   Follow Up Recommendations  SNF;Supervision/Assistance - 24 hour     Equipment Recommendations  None recommended by PT    Recommendations for Other Services       Precautions / Restrictions Precautions Precautions: Fall Precaution Comments: monitor sats,  fall risk    Mobility  Bed Mobility Overal bed mobility: Needs Assistance Bed Mobility: Supine to Sit     Supine to sit: Min guard     General bed mobility comments: for safety diue to confusion. Patient able to get legs onto bed with much encouragement.  Transfers                  General transfer comment: NT due to confusion and agitated.  Ambulation/Gait                 Stairs            Wheelchair Mobility    Modified Rankin (Stroke Patients Only)       Balance Overall balance assessment: Needs assistance Sitting-balance support: Feet supported;Bilateral upper extremity supported   Sitting balance - Comments: patient half way sitting on bed edge.                            Cognition Arousal/Alertness: Awake/alert Behavior During Therapy: Agitated;Impulsive Overall Cognitive Status: No family/caregiver present to determine baseline cognitive functioning                 General Comments: found patient with legs out of bed and baed alarm  sounding. redirected the patient but was adamant that she was going to her room. reoriented the patient but did not appear to  follow.    Exercises      General  Comments        Pertinent Vitals/Pain Pain Assessment: Faces Faces Pain Scale: Hurts a little bit Pain Location: all over Pain Descriptors / Indicators: Discomfort Pain Intervention(s): Monitored during session    Home Living                      Prior Function            PT Goals (current goals can now be found in the care plan section) Progress towards PT goals: Not progressing toward goals - comment (agitated and restles and confused.)    Frequency    Min 3X/week      PT Plan Current plan remains appropriate    Co-evaluation             End of Session Equipment Utilized During Treatment: Oxygen Activity Tolerance: Treatment limited secondary to agitation Patient left: in bed;with call bell/phone within reach;with nursing/sitter in room;with bed alarm set Nurse Communication: Mobility status       Time: 4098-1191 PT Time Calculation (min) (ACUTE ONLY): 11 min  Charges:  $Therapeutic Activity: 8-22 mins                    G Codes:  Rada HayHill, Danaisha Celli Elizabeth 04/13/2016, 1:24 PM Blanchard KelchKaren Jazmynn Pho PT [phone]

## 2016-04-13 NOTE — Progress Notes (Signed)
[facility] LCSW note: This pt is well-known to me from hospice homecare, where she lived alone in an apartment. Pt has been widowed for about three years, moved from her home to apartment, has been to SNFs for rehab three times in the past year. At home, she was alone for periods during the day, had poor judgment. Dtr Nicole Maxwell has tried to honor her wish to be at home but is very realistic that it is time for a permanent, better solution. [facility] and the family have rapport and a connection there. Bed offer is made for tomorrow, 3/21. Hospital LCSW Jamie facilitating FL-2 and [facility] RN liasion Lyndle HerrlichStacie Wilkerson has [facility] . Anticipate d/c tomorrow with [facility] long term care team to follow; LCSW and RN from homecare will give them report. Pt is irritable today. Discussed d/c plan with dtr Nicole Maxwell.

## 2016-04-13 NOTE — Progress Notes (Addendum)
PROGRESS NOTE                                                                                                                                                                                                             Patient Demographics:    Nicole Maxwell, is a 71 y.o. female, DOB - 09/28/1945, WUJ:[id number]  Admit date - 04/11/2016   Admitting Physician Briscoe Deutscher, MD  Outpatient Primary MD for the patient is [name], MD  LOS - 2   Chief Complaint  Patient presents with  . COPD  . Altered Mental Status       Brief Narrative   71 y.o. female with medical history significant for COPD with chronic hypercarbic respiratory failure on hospice,, anemia of chronic disease, anxiety, depression, and chronic low back pain on home hospice who presents to the emergency department with 2 days of progressive somnolence, confusion, apparent respiratory distress, and recurrent falls, workup significant for acute on chronic hypoxic/hypercarbic respiratory failure with COPD exacerbation, patient is DO NOT INTUBATE, She continues to improve on BiPAP, mentation back to baseline.   Subjective:    Nicole Maxwell today Denies any complaints, no worsening dyspnea, complains of cough which is chronic, nonproductive    Assessment  & Plan :    Principal Problem:   COPD exacerbation (HCC) Active Problems:   Severe chronic obstructive pulmonary disease (HCC)   HTN (hypertension)   Chronic diastolic congestive heart failure (HCC)   DM type 2 (diabetes mellitus, type 2) (HCC)   Normocytic anemia   Acute encephalopathy   Essential hypertension   Symptomatic anemia   COPD with acute exacerbation (HCC)   Acute and chronic respiratory failure with hypercapnia (HCC)   COPD with acute exacerbation, acute on chronic respiratory failure with hypoxia and hypercarbia  - Pt has severe chronic COPD on 2 Lpm chronically on  BiPAP at  night, and chronic prednisone  - She presents somnolent, in respiratory distress, and with pH 7.22, pCO2 80, pO2 79 , there is some noncompliance with BiPAP as well. - CXR features a chronic LLL opacity with  improved aeration from prior  -  improving on BiPAP, nightly back at baseline BiPAP at bedtime, continue  DuoNeb's, Dulera,and levofloxacin . Appears to be improving, transitioned to by mouth prednisone in a.m., will need to be discharged on prednisone taper back to her baseline at 5 mg oral daily - No requirement of BiPAP daytime yesterday, she is on BiPAP daily at bedtime at baseline, will transfer to telemetry floor. - Leukocytosis secondary to steroids   Symptomatic RN deficiency anemia  - Pt presents with somnolence, attributable to hypercarbia, but with Hgb only 6.9 and likely contributing  - No melena or hematochezia reported and FOBT is negative  - Hgb had been ranging from 7.6 to 8.6 in February 2018 - received 2  units PRBC, hemoglobin Emend stable at 9.7 today  Acute encephalopathy  - Likely secondary to hypercarbia , CT head with no acute findings, back to baseline  Chronic diastolic CHF  - Appears dry on admission, wt stable   - TTE (07/06/15) with EF 50-55%, grade 1 diastolic dysfunction, no WMA or significant valvular disease noted  - Managed at home with Lasix 20 mg qD, which I will resume today - follow daily wts and I/O's   Anxiety, depression - resume home meds  Hyperglycemia  - Serum glucose 180 on admission, likely secondary to steriods  - A1c 6.0% in February 2018  HYpertension - Blood pressure remains uncontrolled even after started amlodipine 5 mg oral daily, will increase to 10 mg daily  PPD least on left forearm yesterday as requested by SNF for placement,.  Code Status : Partial/DNI  Family Communication  : Discussed with daughter via phone  Disposition Plan  : We'll need SNF/ALF placement, discussed with Child psychotherapist, bed should be  available by tomorrow  Consults  :  None  Procedures  : None   DVT Prophylaxis  :  SCDs, not on  chemical prophylaxis given significant anemia on admission  Lab Results  Component Value Date   PLT 387 04/13/2016    Antibiotics  :    Anti-infectives    Start     Dose/Rate Route Frequency Ordered Stop   04/13/16 0600  levofloxacin (LEVAQUIN) IVPB 750 mg  Status:  Discontinued     750 mg 100 mL/hr over 90 Minutes Intravenous Every 48 hours 04/11/16 0453 04/12/16 1415   04/13/16 0600  levofloxacin (LEVAQUIN) tablet 750 mg     750 mg Oral Every 48 hours 04/12/16 1415     04/11/16 0500  levofloxacin (LEVAQUIN) IVPB 750 mg     750 mg 100 mL/hr over 90 Minutes Intravenous NOW 04/11/16 0429 04/11/16 0724        Objective:   Vitals:   04/13/16 0800 04/13/16 0900 04/13/16 1000 04/13/16 1100  BP: (!) 168/76  (!) 173/57   Pulse: 99 (!) 106 96 96  Resp: (!) 23 20 (!) 27 (!) 25  Temp: 98.2 F (36.8 C)     TempSrc: Oral     SpO2: 100% 100% 97% 99%  Weight:      Height:        Wt Readings from Last 3 Encounters:  04/11/16 57 kg (125 lb 10.6 oz)  03/19/16 57.5 kg (126 lb 12.8 oz)  03/04/16 55.3 kg (121 lb 14.6 oz)     Intake/Output Summary (Last 24 hours) at 04/13/16 1148 Last data filed at 04/13/16 0500  Gross per 24 hour  Intake  360 ml  Output             1050 ml  Net             -690 ml     Physical Exam  Awake, Alert 3 Supple Neck,No JVD,  Symmetrical Chest wall movement, fair air movement bilaterally, No wheezing RRR,No Gallops,Rubs or new Murmurs, No Parasternal Heave +ve B.Sounds, Abd Soft, No tenderness,No rebound - guarding or rigidity. No Cyanosis, Clubbing or edema, No new Rash or bruise     Data Review:    CBC  Recent Labs Lab 04/11/16 0028 04/12/16 0336 04/13/16 0504  WBC 27.1* 22.3* 16.2*  HGB 6.9* 9.4* 9.7*  HCT 24.5* 30.8* 32.8*  PLT 412* 370 387  MCV 79.5 77.8* 77.0*  MCH 22.4* 23.7* 22.8*  MCHC 28.2* 30.5 29.6*  RDW  19.4* 18.3* 19.0*  LYMPHSABS 2.2  --   --   MONOABS 1.4*  --   --   EOSABS 0.0  --   --   BASOSABS 0.0  --   --     Chemistries   Recent Labs Lab 04/11/16 0028 04/12/16 0336 04/13/16 0504  NA 143 142 145  K 4.8 5.0 4.4  CL 105 107 109  CO2 33* 26 30  GLUCOSE 180* 178* 151*  BUN 36* 32* 38*  CREATININE 0.93 0.83 0.79  CALCIUM 8.6* 9.0 9.0   ------------------------------------------------------------------------------------------------------------------ No results for input(s): CHOL, HDL, LDLCALC, TRIG, CHOLHDL, LDLDIRECT in the last 72 hours.  Lab Results  Component Value Date   HGBA1C 6.0 (H) 02/28/2016   ------------------------------------------------------------------------------------------------------------------ No results for input(s): TSH, T4TOTAL, T3FREE, THYROIDAB in the last 72 hours.  Invalid input(s): FREET3 ------------------------------------------------------------------------------------------------------------------  Recent Labs  04/11/16 0643  VITAMINB12 526  FOLATE 20.1  FERRITIN 16  TIBC 358  IRON 6*  RETICCTPCT 1.2    Coagulation profile No results for input(s): INR, PROTIME in the last 168 hours.  No results for input(s): DDIMER in the last 72 hours.  Cardiac Enzymes No results for input(s): CKMB, TROPONINI, MYOGLOBIN in the last 168 hours.  Invalid input(s): CK ------------------------------------------------------------------------------------------------------------------    Component Value Date/Time   BNP 175.2 (H) 04/11/2016 0028    Inpatient Medications  Scheduled Meds: . [START ON 04/14/2016] amLODipine  10 mg Oral Daily  . amLODipine  5 mg Oral Once  . dextromethorphan-guaiFENesin  1 tablet Oral Daily  . fluticasone  1 spray Each Nare BID  . gabapentin  300 mg Oral BID  . insulin aspart  0-9 Units Subcutaneous TID WC  . ipratropium-albuterol  3 mL Nebulization Q6H  . latanoprost  1 drop Both Eyes QHS  .  levofloxacin  750 mg Oral Q48H  . methylPREDNISolone (SOLU-MEDROL) injection  60 mg Intravenous Q12H  . mometasone-formoterol  2 puff Inhalation BID  . nicotine  7 mg Transdermal QHS  . [START ON 04/14/2016] predniSONE  40 mg Oral Q breakfast  . protein supplement shake  11 oz Oral BID BM  . QUEtiapine  12.5 mg Oral QHS  . sodium chloride flush  3 mL Intravenous Q12H  . tuberculin  5 Units Intradermal Once   Continuous Infusions: PRN Meds:.acetaminophen, ALPRAZolam, bisacodyl, hydrALAZINE, ipratropium-albuterol, lip balm, ondansetron **OR** ondansetron (ZOFRAN) IV, polyethylene glycol, polyvinyl alcohol  Micro Results Recent Results (from the past 240 hour(s))  MRSA PCR Screening     Status: None   Collection Time: 04/11/16  7:05 AM  Result Value Ref Range Status   MRSA by PCR NEGATIVE NEGATIVE Final  Comment:        The GeneXpert MRSA Assay (FDA approved for NASAL specimens only), is one component of a comprehensive MRSA colonization surveillance program. It is not intended to diagnose MRSA infection nor to guide or monitor treatment for MRSA infections.   Culture, expectorated sputum-assessment     Status: None   Collection Time: 04/12/16  9:00 AM  Result Value Ref Range Status   Specimen Description EXPECTORATED SPUTUM  Final   Special Requests Immunocompromised  Final   Sputum evaluation THIS SPECIMEN IS ACCEPTABLE FOR SPUTUM CULTURE  Final   Report Status 04/12/2016 FINAL  Final  Culture, respiratory (NON-Expectorated)     Status: None (Preliminary result)   Collection Time: 04/12/16  9:00 AM  Result Value Ref Range Status   Specimen Description EXPECTORATED SPUTUM  Final   Special Requests Immunocompromised Reflexed from [id number]  Final   Gram Stain   Final    RARE WBC PRESENT, PREDOMINANTLY PMN FEW GRAM POSITIVE COCCI IN PAIRS FEW GRAM NEGATIVE RODS RARE GRAM POSITIVE RODS    Culture   Final    CULTURE REINCUBATED FOR BETTER GROWTH Performed at [facility], [address], [place], [place] [address]    Report Status PENDING  Incomplete    Radiology Reports Ct Head Wo Contrast  Result Date: 04/11/2016 CLINICAL DATA:  Falling and dizziness.  Short of breath EXAM: CT HEAD WITHOUT CONTRAST TECHNIQUE: Contiguous axial images were obtained from the base of the skull through the vertex without intravenous contrast. COMPARISON:  None. FINDINGS: Brain: No acute intracranial hemorrhage. No focal mass lesion. No CT evidence of acute infarction. No midline shift or mass effect. No hydrocephalus. Basilar cisterns are patent. There are periventricular and subcortical white matter hypodensities. Generalized cortical atrophy. There had rest. Vascular: No hyperdense vessel or unexpected calcification. Skull: Normal. Negative for fracture or focal lesion. Sinuses/Orbits: Paranasal sinuses and mastoid air cells are clear. Orbits are clear. Other: None. IMPRESSION: 1. No acute intracranial findings. 2. Atrophy and white matter microvascular disease Electronically Signed   By: Genevive Bi M.D.   On: 04/11/2016 17:22   Dg Chest Port 1 View  Result Date: 04/11/2016 CLINICAL DATA:  Chest pain EXAM: PORTABLE CHEST 1 VIEW COMPARISON:  Chest radiograph 02/29/2016 FINDINGS: Slightly improved aeration of the left lower lobe. Small amount of right basilar atelectasis. Cardiomediastinal silhouette is unchanged with persistent calcific aortic atherosclerosis. No pneumothorax or pleural effusion. IMPRESSION: Improved aeration of the left lower lobe. Electronically Signed   By: Deatra Robinson M.D.   On: 04/11/2016 01:33     Franky Reier M.D on 04/13/2016 at 11:48 AM  Between 7am to 7pm - Pager - [phone]  After 7pm go to [email or web address] - password [facility]  [facility] -  Office  [phone]

## 2016-04-13 NOTE — Progress Notes (Signed)
[facility]- [facility] RN Visit @ 11AM  This is a related, covered GIP admission on 04/11/16, per MD Monguilod. Patient was admitted to Hospice on 02/17/16 with COPD. Patient has an OOF DNR in the home. Code status noted in hospital as partial/DNI - no intubation, chest compressions, or defibrillation.She was admitted on 04/11/16 early AM hours with COPD exacerbation and altered mental status (AMS). She presented with the complaint that she had passed out. She also noted productive cough and increased shortness of breath (SOB). She noted that for (2) days, she had progressive somnolence, confusion, SOB, and multiple falls. Her family stated that she had sustained (4) falls in (2) days with one fall noting that she hit her head on the floor. She was admitted for treatment of COPD exacerbation.  Visited patient in room.  No family or visitors present at time of visit.  Patient alert and oriented.  She stated urgency to leave the hospital.  She was repeatedly attempting to get out of bed without assistance during visit.  Used active listening to redirect patient and answer her immediate concerns.  She is currently on 2L [place] with respirations and O2 sats WNL.  Patient continues to receive scheduled nebulizer treatments.  Her antibiotics have now been transitioned to Levaquin PO.  She continues to receive Solu-medol 60 mg IV Q 12 hours. Patient received PPD skin test to left arm 3/19 at 1447.  Patient required 3 doses of 0.5 mg Xanax the past 24 hours for anxiety and 2 doses of 650 mg Tylenol PO for pain.  Patient also received 2 doses of 10 mg Hydralazine IV for SBP > 155.  Patient denied any hospice-related questions or concerns.  Per discussion with [facility] social worker, Thurston Holenne and CSW, Asher MuirJamie, the [facility], when medically appropriate, as early as tomorrow.  Placed order for hospital bed, OBT, and oxygen set-up [facility] Ordered low bed d/t patient's continued high fall risk. Spoke to contact person, Eleonore ChiquitoKeisha Peele, at the facility [phone], who [facility]  Daughter, [name] plans to delivery the patient's home BiPap to the facility, prior to hospital discharge.  [facility] will continue to follow and anticipate any discharge needs.  Please feel free to contact with any questions.  Thank you, Hessie KnowsStacie Wilkinson RN, BSN [facility] Hospital Liaison [phone]  [facility] hospital liaisons now available on AMION.

## 2016-04-13 NOTE — Progress Notes (Signed)
Pt. agitated at the beginning of the shift. Trying to get out of bed and insisting on leaving for home.[facility].  BP was 170/60. Hydralazine prn given.  Bed available for transfer to 4th floor  at 9 pm. Report given to AdenaDawn, [place]

## 2016-04-14 DIAGNOSIS — D500 Iron deficiency anemia secondary to blood loss (chronic): Secondary | ICD-10-CM

## 2016-04-14 DIAGNOSIS — N183 Chronic kidney disease, stage 3 (moderate): Secondary | ICD-10-CM

## 2016-04-14 DIAGNOSIS — E1122 Type 2 diabetes mellitus with diabetic chronic kidney disease: Secondary | ICD-10-CM

## 2016-04-14 LAB — GLUCOSE, CAPILLARY
GLUCOSE-CAPILLARY: 136 mg/dL — AB (ref 65–99)
Glucose-Capillary: 144 mg/dL — ABNORMAL HIGH (ref 65–99)
Glucose-Capillary: 180 mg/dL — ABNORMAL HIGH (ref 65–99)

## 2016-04-14 LAB — CBC
HEMATOCRIT: 35.7 % — AB (ref 36.0–46.0)
Hemoglobin: 10.6 g/dL — ABNORMAL LOW (ref 12.0–15.0)
MCH: 23.2 pg — ABNORMAL LOW (ref 26.0–34.0)
MCHC: 29.7 g/dL — AB (ref 30.0–36.0)
MCV: 78.3 fL (ref 78.0–100.0)
Platelets: 356 10*3/uL (ref 150–400)
RBC: 4.56 MIL/uL (ref 3.87–5.11)
RDW: 19.7 % — AB (ref 11.5–15.5)
WBC: 12.3 10*3/uL — ABNORMAL HIGH (ref 4.0–10.5)

## 2016-04-14 LAB — CULTURE, RESPIRATORY W GRAM STAIN: Culture: NORMAL

## 2016-04-14 MED ORDER — AMLODIPINE BESYLATE 10 MG PO TABS: 10.0000 mg | ORAL_TABLET | Freq: Every day | ORAL | 0 refills | Status: AC

## 2016-04-14 MED ORDER — LEVOFLOXACIN 250 MG PO TABS: 250.0000 mg | ORAL_TABLET | Freq: Every day | ORAL | 0 refills | Status: AC

## 2016-04-14 MED ORDER — PANTOPRAZOLE SODIUM 40 MG PO TBEC: 40.0000 mg | DELAYED_RELEASE_TABLET | Freq: Every day | ORAL | 0 refills | Status: AC

## 2016-04-14 MED ORDER — PREDNISONE 10 MG PO TABS: ORAL_TABLET | ORAL | 0 refills | Status: AC

## 2016-04-14 NOTE — NC FL2 (Signed)
[place] MEDICAID FL2 LEVEL OF CARE SCREENING TOOL     IDENTIFICATION  Patient Name: Nicole Maxwell Birthdate: May 10, 1945 Sex: female Admission Date (Current Location): 04/11/2016  [facility] [place]   Facility and Address:  [facility],  [address], [place] [id number]      Provider Number: [id number]  Attending Physician Name and Address:  Lenox PondsEdwin Silva Zapata, MD  Relative Name and Phone Number:       Current Level of Care: Hospital Recommended Level of Care: Assisted Living Facility Prior Approval Number:    Date Approved/Denied:   PASRR Number: [id number]  Discharge Plan: Other (Comment) (Assited Living Facility)    Current Diagnoses: Patient Active Problem List   Diagnosis Date Noted  . Symptomatic anemia 04/11/2016  . COPD with acute exacerbation (HCC) 04/11/2016  . Acute and chronic respiratory failure with hypercapnia (HCC) 04/11/2016  . Somnolence   . DNR (do not resuscitate) discussion 03/02/2016  . Palliative care by specialist 03/02/2016  . Acute encephalopathy   . Aspiration pneumonia of left lower lobe due to vomit (HCC)   . Essential hypertension   . Chronic diastolic CHF (congestive heart failure) (HCC)   . Acute on chronic respiratory failure with hypoxia and hypercapnia (HCC) 02/28/2016  . Acute LUQ pain 02/28/2016  . Sepsis (HCC) 11/25/2015  . Tobacco use 06/30/2015  . Protein-calorie malnutrition, severe 03/23/2015  . Slow transit constipation 01/07/2015  . Normocytic anemia 11/27/2014  . Physical deconditioning 07/01/2014  . Pulmonary infiltrate in left lung on chest x-ray 04/16/2014  . DM type 2 (diabetes mellitus, type 2) (HCC) 04/16/2014  . Vitamin D deficiency disease 04/16/2014  . Esophageal dysmotility 02/27/2014  . HCAP (healthcare-associated pneumonia) 02/21/2014  . Chronic diastolic congestive heart failure (HCC) 01/07/2014  . Acute on chronic respiratory failure (HCC) 01/03/2014  . COPD  exacerbation (HCC) 12/09/2011  . HTN (hypertension) 05/12/2010  . Hyperlipidemia 05/12/2010  . Severe chronic obstructive pulmonary disease (HCC) 04/29/2010    Orientation RESPIRATION BLADDER Height & Weight     Place, Self  O2 Continent Weight: 128 lb 12 oz (58.4 kg) Height:  5\' 2"  (157.5 cm)  BEHAVIORAL SYMPTOMS/MOOD NEUROLOGICAL BOWEL NUTRITION STATUS        Diet  AMBULATORY STATUS COMMUNICATION OF NEEDS Skin   Extensive Assist Verbally Normal                       Personal Care Assistance Level of Assistance  Bathing, Feeding, Dressing Bathing Assistance: Limited assistance Feeding assistance: Independent Dressing Assistance: Limited assistance     Functional Limitations Info             SPECIAL CARE FACTORS FREQUENCY  PT (By licensed PT), OT (By licensed OT)     PT Frequency: Min 3x/week OT Frequency: Min 3x/week            Contractures Contractures Info: Not present    Additional Factors Info  Code Status, Allergies Code Status Info: Partial Code  Allergies Info: Brovana Arformoterol Tartrate; Budesonide; Ativan Lorazepam; Mucomyst Acetylcysteine              Discharge Medications: Please see discharge summary for a list of discharge medications.  Medication List    STOP taking these medications   amoxicillin-clavulanate 875-125 MG tablet Commonly known as:  AUGMENTIN   atorvastatin 20 MG tablet Commonly known as:  LIPITOR   feeding supplement (GLUCERNA SHAKE) Liqd     TAKE these medications  acetaminophen 500 MG tablet Commonly known as:  TYLENOL Take 500 mg by mouth every 6 (six) hours as needed for mild pain.   albuterol 108 (90 Base) MCG/ACT inhaler Commonly known as:  PROAIR HFA Inhale 2 puffs into the lungs every 6 (six) hours as needed. What changed:  when to take this   albuterol (2.5 MG/3ML) 0.083% nebulizer solution Commonly known as:  PROVENTIL Take 3 mLs (2.5 mg total) by nebulization every 4 (four) hours  as needed for wheezing or shortness of breath. (may take 1 every 4 hours as needed for wheezing/shortness of breath) DX: 496 What changed:  Another medication with the same name was changed. Make sure you understand how and when to take each.   ALPRAZolam 0.5 MG tablet Commonly known as:  XANAX Take 1 tablet (0.5 mg total) by mouth 2 (two) times daily as needed for anxiety.   ALPRAZolam 1 MG tablet Commonly known as:  XANAX Take 1 tablet (1 mg total) by mouth at bedtime.   amLODipine 10 MG tablet Commonly known as:  NORVASC Take 1 tablet (10 mg total) by mouth daily. Start taking on:  04/15/2016   aspirin 81 MG EC tablet Take 1 tablet (81 mg total) by mouth daily.   budesonide-formoterol 160-4.5 MCG/ACT inhaler Commonly known as:  SYMBICORT Inhale 2 puffs into the lungs 2 (two) times daily.   fluticasone 50 MCG/ACT nasal spray Commonly known as:  FLONASE Place 1 spray into both nostrils 2 (two) times daily.   furosemide 20 MG tablet Commonly known as:  LASIX Take 20 mg by mouth daily.   gabapentin 300 MG capsule Commonly known as:  NEURONTIN Take 300 mg by mouth 2 (two) times daily.   HYDROcodone-acetaminophen 5-325 MG tablet Commonly known as:  NORCO/VICODIN Take 1 tablet by mouth every 4 (four) hours as needed for moderate pain.   hydroxypropyl methylcellulose / hypromellose 2.5 % ophthalmic solution Commonly known as:  ISOPTO TEARS / GONIOVISC Place 1 drop into both eyes 3 (three) times daily as needed for dry eyes.   ipratropium-albuterol 0.5-2.5 (3) MG/3ML Soln Commonly known as:  DUONEB Take 3 mLs by nebulization 4 (four) times daily.   latanoprost 0.005 % ophthalmic solution Commonly known as:  XALATAN Place 1 drop into both eyes at bedtime.   levofloxacin 250 MG tablet Commonly known as:  LEVAQUIN Take 1 tablet (250 mg total) by mouth daily.   MUCINEX DM MAXIMUM STRENGTH 60-1200 MG Tb12 Take 1 tablet by mouth every 12 (twelve) hours. What  changed:  when to take this   OXYGEN Inhale 2 L into the lungs continuous.   pantoprazole 40 MG tablet Commonly known as:  PROTONIX Take 1 tablet (40 mg total) by mouth daily. Start taking on:  04/15/2016   potassium chloride SA 20 MEQ tablet Commonly known as:  K-DUR,KLOR-CON Take 20 mEq by mouth daily.   predniSONE 5 MG tablet Commonly known as:  DELTASONE Take 1 tablet (5 mg total) by mouth daily with breakfast. What changed:  Another medication with the same name was added. Make sure you understand how and when to take each.   predniSONE 10 MG tablet Commonly known as:  DELTASONE Take 4 tablets for 4 days, 3 tablets for 4 days, 2 tablets for 4 days, 1 tablet for 4 days, then 0.5 tablet daily. What changed:  You were already taking a medication with the same name, and this prescription was added. Make sure you understand how and when to take each.  QUEtiapine 25 MG tablet Commonly known as:  SEROQUEL Take 0.5 tablets (12.5 mg total) by mouth at bedtime.     Relevant Imaging Results:  Relevant Lab Results:   Additional Information SSN [id number]  Antionette Poles, LCSW

## 2016-04-14 NOTE — Progress Notes (Signed)
No changes to am assessment. Pt reports having all belongings. Pt changed into own clothing for transport.

## 2016-04-14 NOTE — Progress Notes (Signed)
Occupational Therapy Treatment Patient Details Name: Nicole Maxwell MRN: [id number] DOB: [birth year] Today's Date: 04/14/2016    History of present illness Pt admitted through ed with SOB, resp distress, AMS and frequent falls.  Pt with hx of DM, PVD, COPD, chronic anemia and ETOH abuse.   OT comments  Pt wanted to get dressed. VC needed to take breaks during ADL activity   Follow Up Recommendations  Home health OT;Other (comment) (at ALF)    Equipment Recommendations  None recommended by OT    Recommendations for Other Services      Precautions / Restrictions Precautions Precautions: Fall Precaution Comments: monitor sats,  fall risk       Mobility Bed Mobility Overal bed mobility: Needs Assistance Bed Mobility: Supine to Sit     Supine to sit: Supervision        Transfers Overall transfer level: Needs assistance Equipment used: 1 person hand held assist Transfers: Sit to/from UGI CorporationStand;Stand Pivot Transfers Sit to Stand: Min assist Stand pivot transfers: Min assist            Balance                                   ADL Overall ADL's : Needs assistance/impaired     Grooming: Standing;Cueing for safety;Min guard           Upper Body Dressing : Set up;Sitting   Lower Body Dressing: Minimal assistance;Sit to/from stand;Cueing for safety;Cueing for sequencing;Cueing for compensatory techniques Lower Body Dressing Details (indicate cue type and reason): VC for energy conservation and to take rest breaks Toilet Transfer: Minimal assistance;Cueing for safety;Cueing for sequencing;Ambulation;RW   Toileting- Clothing Manipulation and Hygiene: Minimal assistance;Sit to/from stand;Cueing for safety;Cueing for sequencing         General ADL Comments: pt needed VC for safety and to slow down during OT tx.             Praxis      Cognition   Behavior During Therapy: [facility] for tasks assessed/performed;Impulsive Overall Cognitive Status:  Within Functional Limits for tasks assessed                          Prior Functioning/Environment              Frequency  Min 2X/week        Progress Toward Goals  OT Goals(current goals can now be found in the care plan section)  Progress towards OT goals: Progressing toward goals     Plan         End of Session Equipment Utilized During Treatment: Rolling walker  OT Visit Diagnosis: Unsteadiness on feet (R26.81);Muscle weakness (generalized) (M62.81);Repeated falls (R29.6)   Activity Tolerance Patient tolerated treatment well   Patient Left in chair;with call bell/phone within reach;with chair alarm set;with family/visitor present   Nurse Communication Mobility status        Time: 1447-1510 OT Time Calculation (min): 23 min  Charges: OT General Charges $OT Visit: 1 Procedure OT Treatments $Self Care/Home Management : 23-37 mins  HoxieLori Camela Wich, [place]OT [phone]   Alba CoryREDDING, [name] 04/14/2016, 3:51 PM

## 2016-04-14 NOTE — Progress Notes (Signed)
[facility]  Via ambulance. Patient alert and in no distress noted. No changes from prior nurses assessment. Daughter notified of patient's discharge. Skin intact, no wound noted.

## 2016-04-14 NOTE — Progress Notes (Addendum)
PPD lt arm  negative, Reports called to Harrel LemonDenishia Kiser RN [phone]. Questions, concerns denied.

## 2016-04-14 NOTE — Care Management Note (Signed)
Case Management Note  Patient Details  Name: Nicole Maxwell MRN: [id number] Date of Birth: [birth year]  Subjective/Objective: 71 y/o f admitted w/COPD. Active w/[facility]. CSW following for ALF-[facility]. Per csw-all dme-02 has already been delivered to ALF. CSW will manage transportation to ALF.                   Action/Plan:d/c plan ALF.   Expected Discharge Date:   (unknown)               Expected Discharge Plan:  Assisted Living / Rest Home  In-House Referral:     Discharge planning Services     Post Acute Care Choice:  Hospice (Active w/[facility]) Choice offered to:     DME Arranged:    DME Agency:     HH Arranged:    HH Agency:     Status of Service:  In process, will continue to follow  If discussed at Long Length of Stay Meetings, dates discussed:    Additional Comments:  Lanier ClamMahabir, Coda Mathey, RN 04/14/2016, 1:06 PM

## 2016-04-14 NOTE — Progress Notes (Signed)
SATURATION QUALIFICATIONS:  Patient Saturations on Room Air at Rest = 87 %  Patient Saturations on Room Air while Ambulating = 86%  Patient Saturations on 2 Liters of oxygen while Ambulating = 94%  Please briefly explain why patient needs home oxygen:pt did not tolerate activity well. Pt was only able to ambulate to the bedside commode. Pt immediately became short of breath. Oxygen saturation assessed 87% on room air. Oxygen applied at 2 lts, sat increased 94%.

## 2016-04-14 NOTE — Discharge Summary (Signed)
Physician Discharge Summary  Nicole Maxwell  RUE:[id number]  DOB: 07-Feb-1945  DOA: 04/11/2016 PCP: [name], MD  Admit date: 04/11/2016 Discharge date: 04/14/2016  Admitted From: Home  Disposition:  ALF   Recommendations for Outpatient Follow-up:  1. Follow up with PCP in 1-2 weeks 2. Please obtain BMP/CBC in one week to monitor hemoglobin and kidney function  3. Hospice follow up  4. 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 Patient was treated with BiPAP, IV steroids and nebulizers subsequently mentation and respiratory system improved, and back to baseline. PT evaluation recommended patient to go to rehab facility for physical strengthening.   Subjective: Patient seen and examined. Have no complaints, patient feels that he breathing is back to baseline. Patient is alert and oriented x 3 and denies any chest pain, SOB and palpitations. No acute events overnight. Patient afebrile during the hospital stay. 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 -No requirement of BiPAP daytime for the past 48 hours -CXR features a chronic LLL opacity with improved aeration from prior  -Continue DuoNeb's, Dulera,and levofloxacin. -Breathing improved steroids were, transitioned to oral prednisone, and patient will be discharged on prednisone taper back to her baseline at 5 mg oral daily -Leukocytosis secondary to steroids trending down   Symptomatic iron deficiency anemia  -Pt presents with somnolence, attributable to hypercarbia, but with Hgb only 6.9 and likely contributing  -No melena or hematochezia reported and FOBT is negative  -Hgb had been ranging from 7.6 to 8.6 in February 2018 -S/p 2 units of PRBC's - hemoglobin stable at 10.6 -Will prescribe iron supplement   Acute encephalopathy - resolved  -Likely secondary to hypercarbia , CT head with no acute findings, back to baseline  Chronic diastolic CHF  -Seem to be compensated  -TTE (07/06/15) with EF 50-55%, grade 1 diastolic dysfunction, no WMA or significant valvular disease noted  -Continue home Lasix  -Follow daily wts and I/O's   Anxiety, depression -Continue home meds  Hyperglycemia  -Serum glucose 180 on admission, likely secondary to steriods  -A1c 6.0% in February 2018  Hypertension -BP slight elevated  -Norvasc was increased to 10 mg  -Patient asymptomatic - follow up with PCP for further medication adjustment  -Avoid BB in view of severe COPD   All other chronic medical condition were stable during the hospitalization.  Patient was seen by physical therapy and recommending 24 hr supervision On the day of the discharge the patient's vitals were stable, and no other acute medical condition were reported by patient. the patient was felt safe to be discharge to ALF   Discharge Instructions  You were cared for by a hospitalist during your hospital stay. If you have any questions about your  discharge medications or the care you received while you were in the hospital after you are discharged, you can call the unit and asked to speak with the hospitalist on call if the hospitalist that took care of you is not available. Once you are discharged, your primary care physician will handle any further medical issues. Please note that NO REFILLS for any discharge medications will be authorized once you are discharged, as it is imperative that you return to your primary care physician (or establish a relationship with a primary care physician if you do not have one) for your aftercare needs so that they can reassess your need for medications and monitor your lab values.  Discharge Instructions    Call MD for:  difficulty breathing, headache or visual disturbances    Complete by:  As directed    Call MD for:  extreme fatigue    Complete by:  As directed    Call MD for:  hives    Complete by:  As directed    Call MD for:  persistant dizziness or light-headedness    Complete by:  As directed    Call MD for:  persistant nausea and vomiting    Complete by:  As directed    Call MD for:  redness, tenderness, or signs of infection (pain, swelling, redness, odor or green/yellow discharge around incision site)    Complete by:  As directed    Call MD for:  severe uncontrolled pain    Complete by:  As directed    Call MD for:  temperature >100.4    Complete by:  As directed    Diet - low sodium heart healthy    Complete by:  As directed    Increase activity slowly    Complete by:  As directed      Allergies as of 04/14/2016      Reactions   Brovana [arformoterol Tartrate] Shortness Of Breath, Cough   General intolerance   Budesonide Shortness Of Breath, Cough   General intolerance   Mucomyst [acetylcysteine] Shortness Of Breath   Ativan [lorazepam]       Medication List    STOP taking these medications   amoxicillin-clavulanate 875-125 MG tablet Commonly known as:  AUGMENTIN    atorvastatin 20 MG tablet Commonly known as:  LIPITOR   feeding supplement (GLUCERNA SHAKE) Liqd     TAKE these medications   acetaminophen 500 MG tablet Commonly known as:  TYLENOL Take 500 mg by mouth every 6 (six) hours as needed for mild pain.   albuterol 108 (90 Base) MCG/ACT inhaler Commonly known as:  PROAIR HFA Inhale 2 puffs into the lungs every 6 (six) hours as needed. What changed:  when to take this   albuterol (2.5 MG/3ML) 0.083% nebulizer solution Commonly known as:  PROVENTIL Take 3 mLs (2.5 mg total) by nebulization every 4 (four) hours as needed for wheezing or shortness of breath. (may take 1 every 4 hours as needed for wheezing/shortness of breath) DX: 496 What changed:  Another medication with the same name was changed. Make sure you understand how and when to take each.   ALPRAZolam 0.5 MG tablet Commonly known as:  XANAX Take 1 tablet (0.5 mg total) by mouth 2 (two) times daily as needed for anxiety.   ALPRAZolam 1 MG tablet Commonly known as:  XANAX Take 1 tablet (1 mg total)  by mouth at bedtime.   amLODipine 10 MG tablet Commonly known as:  NORVASC Take 1 tablet (10 mg total) by mouth daily. Start taking on:  04/15/2016   aspirin 81 MG EC tablet Take 1 tablet (81 mg total) by mouth daily.   budesonide-formoterol 160-4.5 MCG/ACT inhaler Commonly known as:  SYMBICORT Inhale 2 puffs into the lungs 2 (two) times daily.   fluticasone 50 MCG/ACT nasal spray Commonly known as:  FLONASE Place 1 spray into both nostrils 2 (two) times daily.   furosemide 20 MG tablet Commonly known as:  LASIX Take 20 mg by mouth daily.   gabapentin 300 MG capsule Commonly known as:  NEURONTIN Take 300 mg by mouth 2 (two) times daily.   HYDROcodone-acetaminophen 5-325 MG tablet Commonly known as:  NORCO/VICODIN Take 1 tablet by mouth every 4 (four) hours as needed for moderate pain.   hydroxypropyl methylcellulose / hypromellose 2.5 % ophthalmic  solution Commonly known as:  ISOPTO TEARS / GONIOVISC Place 1 drop into both eyes 3 (three) times daily as needed for dry eyes.   ipratropium-albuterol 0.5-2.5 (3) MG/3ML Soln Commonly known as:  DUONEB Take 3 mLs by nebulization 4 (four) times daily.   latanoprost 0.005 % ophthalmic solution Commonly known as:  XALATAN Place 1 drop into both eyes at bedtime.   levofloxacin 250 MG tablet Commonly known as:  LEVAQUIN Take 1 tablet (250 mg total) by mouth daily.   MUCINEX DM MAXIMUM STRENGTH 60-1200 MG Tb12 Take 1 tablet by mouth every 12 (twelve) hours. What changed:  when to take this   OXYGEN Inhale 2 L into the lungs continuous.   pantoprazole 40 MG tablet Commonly known as:  PROTONIX Take 1 tablet (40 mg total) by mouth daily. Start taking on:  04/15/2016   potassium chloride SA 20 MEQ tablet Commonly known as:  K-DUR,KLOR-CON Take 20 mEq by mouth daily.   predniSONE 5 MG tablet Commonly known as:  DELTASONE Take 1 tablet (5 mg total) by mouth daily with breakfast. What changed:  Another medication with the same name was added. Make sure you understand how and when to take each.   predniSONE 10 MG tablet Commonly known as:  DELTASONE Take 4 tablets for 4 days, 3 tablets for 4 days, 2 tablets for 4 days, 1 tablet for 4 days, then 0.5 tablet daily. What changed:  You were already taking a medication with the same name, and this prescription was added. Make sure you understand how and when to take each.   QUEtiapine 25 MG tablet Commonly known as:  SEROQUEL Take 0.5 tablets (12.5 mg total) by mouth at bedtime.      Follow-up Information    [name], MD. Schedule an appointment as soon as possible for a visit in 1 week(s).   Specialty:  Internal Medicine Contact information: [address] [place] [address] [phone]          Allergies  Allergen Reactions  . Brovana [Arformoterol Tartrate] Shortness Of Breath and Cough    General intolerance   . Budesonide Shortness Of Breath and Cough    General intolerance  . Mucomyst [Acetylcysteine] Shortness Of Breath  . Ativan [Lorazepam]     Consultations:  None    Procedures/Studies: Ct Head Wo Contrast  Result Date: 04/11/2016 CLINICAL DATA:  Falling and dizziness.  Short of breath EXAM: CT HEAD WITHOUT CONTRAST TECHNIQUE: Contiguous axial images were obtained from the base of the skull through the vertex without intravenous contrast. COMPARISON:  None. FINDINGS: Brain: No acute intracranial hemorrhage. No focal mass lesion. No CT evidence of acute infarction. No midline shift or mass effect. No hydrocephalus. Basilar cisterns are patent. There are periventricular and subcortical white matter hypodensities. Generalized cortical atrophy. There had rest. Vascular: No hyperdense vessel or unexpected calcification. Skull: Normal. Negative for fracture or focal lesion. Sinuses/Orbits: Paranasal sinuses and mastoid air cells are clear. Orbits are clear. Other: None. IMPRESSION: 1. No acute intracranial findings. 2. Atrophy and white matter microvascular disease Electronically Signed   By: Genevive Bi M.D.   On: 04/11/2016 17:22   Dg Chest Port 1 View  Result Date: 04/11/2016 CLINICAL DATA:  Chest pain EXAM: PORTABLE CHEST 1 VIEW COMPARISON:  Chest radiograph 02/29/2016 FINDINGS: Slightly improved aeration of the left lower lobe. Small amount of right basilar atelectasis. Cardiomediastinal silhouette is unchanged with persistent calcific aortic atherosclerosis. No pneumothorax or pleural effusion. IMPRESSION: Improved aeration of the left lower lobe. Electronically Signed   By: Deatra Robinson M.D.   On: 04/11/2016 01:33     Discharge Exam: Vitals:   04/14/16 0949 04/14/16 1419  BP: (!) 166/72 (!) 146/64  Pulse:  (!) 103  Resp:  18  Temp:  98.9 F (37.2 C)   Vitals:   04/14/16 0601 04/14/16 0825 04/14/16 0949 04/14/16 1419  BP: (!) 155/60  (!) 166/72 (!) 146/64  Pulse: 90   (!) 103   Resp: 20   18  Temp: 98.7 F (37.1 C)   98.9 F (37.2 C)  TempSrc: Oral   Oral  SpO2: 98% 96%  100%  Weight: 58.4 kg (128 lb 12 oz)     Height:        General: Pt is alert, awake, not in acute distress Cardiovascular: RRR, S1/S2 +, no rubs, no gallops Respiratory: Breath sound diminish bilaterally, no wheezing or rales.  Abdominal: Soft, NT, ND, bowel sounds + Extremities: no edema, no cyanosis   The results of significant diagnostics from this hospitalization (including imaging, microbiology, ancillary and laboratory) are listed below for reference.     Microbiology: Recent Results (from the past 240 hour(s))  MRSA PCR Screening     Status: None   Collection Time: 04/11/16  7:05 AM  Result Value Ref Range Status   MRSA by PCR NEGATIVE NEGATIVE Final    Comment:        The GeneXpert MRSA Assay (FDA approved for NASAL specimens only), is one component of a comprehensive MRSA colonization surveillance program. It is not intended to diagnose MRSA infection nor to guide or monitor treatment for MRSA infections.   Culture, expectorated sputum-assessment     Status: None   Collection Time: 04/12/16  9:00 AM  Result Value Ref Range Status   Specimen Description EXPECTORATED SPUTUM  Final   Special Requests Immunocompromised  Final   Sputum evaluation THIS SPECIMEN IS ACCEPTABLE FOR SPUTUM CULTURE  Final   Report Status 04/12/2016 FINAL  Final  Culture, respiratory (NON-Expectorated)     Status: None   Collection Time: 04/12/16  9:00 AM  Result Value Ref Range Status   Specimen Description EXPECTORATED SPUTUM  Final   Special Requests Immunocompromised Reflexed from [id number]  Final   Gram Stain   Final    RARE WBC PRESENT, PREDOMINANTLY PMN FEW GRAM POSITIVE COCCI IN PAIRS FEW GRAM NEGATIVE RODS RARE GRAM POSITIVE RODS    Culture   Final    Consistent with normal respiratory flora. Performed at [facility], [address]  [place], [place] [address]    Report  Status 04/14/2016 FINAL  Final     Labs: BNP (last 3 results)  Recent Labs  11/24/15 2145 02/28/16 0649 04/11/16 0028  BNP 66.2 155.0* 175.2*   Basic Metabolic Panel:  Recent Labs Lab 04/11/16 0028 04/12/16 0336 04/13/16 0504  NA 143 142 145  K 4.8 5.0 4.4  CL 105 107 109  CO2 33* 26 30  GLUCOSE 180* 178* 151*  BUN 36* 32* 38*  CREATININE 0.93 0.83 0.79  CALCIUM 8.6* 9.0 9.0   Liver Function Tests: No results for input(s): AST, ALT, ALKPHOS, BILITOT, PROT, ALBUMIN in the last 168 hours. No results for input(s): LIPASE, AMYLASE in the last 168 hours. No results for input(s): AMMONIA in the last 168 hours. CBC:  Recent Labs Lab 04/11/16 0028 04/12/16 0336 04/13/16 0504 04/14/16 0835  WBC 27.1* 22.3* 16.2* 12.3*  NEUTROABS 23.5*  --   --   --   HGB 6.9* 9.4* 9.7* 10.6*  HCT 24.5* 30.8* 32.8* 35.7*  MCV 79.5 77.8* 77.0* 78.3  PLT 412* 370 387 356   Cardiac Enzymes: No results for input(s): CKTOTAL, CKMB, CKMBINDEX, TROPONINI in the last 168 hours. BNP: Invalid input(s): POCBNP CBG:  Recent Labs Lab 04/13/16 1212 04/13/16 1635 04/13/16 2119 04/14/16 0803 04/14/16 1225  GLUCAP 149* 127* 192* 144* 136*   D-Dimer No results for input(s): DDIMER in the last 72 hours. Hgb A1c No results for input(s): HGBA1C in the last 72 hours. Lipid Profile No results for input(s): CHOL, HDL, LDLCALC, TRIG, CHOLHDL, LDLDIRECT in the last 72 hours. Thyroid function studies No results for input(s): TSH, T4TOTAL, T3FREE, THYROIDAB in the last 72 hours.  Invalid input(s): FREET3 Anemia work up No results for input(s): VITAMINB12, FOLATE, FERRITIN, TIBC, IRON, RETICCTPCT in the last 72 hours. Urinalysis    Component Value Date/Time   COLORURINE STRAW (A) 04/11/2016 0028   APPEARANCEUR CLEAR 04/11/2016 0028   LABSPEC 1.013 04/11/2016 0028   PHURINE 6.0 04/11/2016 0028   GLUCOSEU NEGATIVE 04/11/2016 0028   HGBUR NEGATIVE 04/11/2016 0028   BILIRUBINUR NEGATIVE  04/11/2016 0028   KETONESUR NEGATIVE 04/11/2016 0028   PROTEINUR 30 (A) 04/11/2016 0028   NITRITE NEGATIVE 04/11/2016 0028   LEUKOCYTESUR NEGATIVE 04/11/2016 0028   Sepsis Labs Invalid input(s): PROCALCITONIN,  WBC,  LACTICIDVEN Microbiology Recent Results (from the past 240 hour(s))  MRSA PCR Screening     Status: None   Collection Time: 04/11/16  7:05 AM  Result Value Ref Range Status   MRSA by PCR NEGATIVE NEGATIVE Final    Comment:        The GeneXpert MRSA Assay (FDA approved for NASAL specimens only), is one component of a comprehensive MRSA colonization surveillance program. It is not intended to diagnose MRSA infection nor to guide or monitor treatment for MRSA infections.   Culture, expectorated sputum-assessment     Status: None   Collection Time: 04/12/16  9:00 AM  Result Value Ref Range Status   Specimen Description EXPECTORATED SPUTUM  Final   Special Requests Immunocompromised  Final   Sputum evaluation THIS SPECIMEN IS ACCEPTABLE FOR SPUTUM CULTURE  Final   Report Status 04/12/2016 FINAL  Final  Culture, respiratory (NON-Expectorated)     Status: None   Collection Time: 04/12/16  9:00 AM  Result Value Ref Range Status   Specimen Description EXPECTORATED SPUTUM  Final   Special Requests Immunocompromised Reflexed from [id number]  Final   Gram Stain   Final    RARE  WBC PRESENT, PREDOMINANTLY PMN FEW GRAM POSITIVE COCCI IN PAIRS FEW GRAM NEGATIVE RODS RARE GRAM POSITIVE RODS    Culture   Final    Consistent with normal respiratory flora. Performed at [facility], [address] [place], [place] [address]    Report Status 04/14/2016 FINAL  Final    Time coordinating discharge:  36 minutes  SIGNED:  Latrelle DodrillEdwin Silva, MD  [facility] 04/14/2016, 2:21 PM  Pager please text page via  [email or web address] Password [email or web address]

## 2016-04-14 NOTE — Progress Notes (Addendum)
[facility] RN Visit @ 10AM  This is a related, covered GIP admission on 04/11/16,per MD Monguilod. Patient was admitted to Hospice on 02/17/16 with COPD. Patient has an OOF DNR in the home. Code status noted in hospital as partial/DNI - no intubation, chest compressions, or defibrillation.She was admitted on 04/11/16 early AM hours with COPD exacerbation and altered mental status (AMS). She presented with the complaint that she had passed out.She also notedproductive cough and increased shortness of breath (SOB). She notedthat for (2) days, she had progressive somnolence, confusion, SOB, and multiple falls. Her family stated that she hadsustained (4) falls in (2) days with one fall noting that she hit her head on the floor. She was admitted for treatment of COPD exacerbation.  Visited patient in room.  No family or visitors present at time of visit.  Patient was alert and mostly oriented.  Patient was sitting up in bed, eating breakfast.  Patient states "I need to leave soon, because I could be doing some things at home".   Patient knew she was in hospital, but unable to tell me what the plan was for future.  Patient in NAD and anxiety seems to be minimal this morning.  Patient was pleasant and conversed easily.    Patient receiving:  Furosemide (LASIX) tablet 20 mg, Dose 20 mg, QD via PO; ipratropium-albuterol (DUONEB) 0.5-2.5 (3) MG/3ML nebulizer solution 3 mL, Dose 3 mL, Q6H, via NEB; levofloxacin (LEVAQUIN) tablet 750 mg, Dose 750 mg, Q48H, via PO; mometasone-formoterol (DULERA) 200-5 MCG/ACT inhaler 2 puff, Dose 2 puff, BID, via INH; prednison (DELTASONE) tablet 40 mg, Dose 40 mg, Daily with breakfast, via PO;  QUEtiapine (SEROQUEL) tablet 12.5 mg, Dose 12.5 mg, Daily at bedtime, via PO.  Patient has received one PRN medication this morning at 0949 - ALPRAZolam (XANAX) tablet 0.5 mg, Dose 0.5 mg, 3 times daily PRN, via PO.  Spoke with  Cala BradfordKimberly, CSW, and she advised that at this time, patient is still set to discharge to ALF today.  [facility] will continue to follow and anticipate any discharge needs.  Please feel free to contact with any questions.  Thank you,  Adele BarthelAmy Evans, RN, BSN [facility] Hospital Liaison [phone]  All hospital liaison's are now on AMION.

## 2016-04-14 NOTE — Clinical Social Work Placement (Signed)
Patient received and [facility]. [place]. Patient's family notified of transport.  CLINICAL SOCIAL WORK PLACEMENT  NOTE  Date:  04/14/2016  Patient Details  Name: Nicole Maxwell MRN: [id number] Date of Birth: 03-Jan-1946  Clinical Social Work is seeking post-discharge placement for this patient at the Assisted Living Facility level of care (*CSW will initial, date and re-position this form in  chart as items are completed):  Yes   Patient/family provided with [facility] Clinical Social Work Department's list of facilities offering this level of care within the geographic area requested by the patient (or if unable, by the patient's family).  Yes   Patient/family informed of their freedom to choose among providers that offer the needed level of care, that participate in Medicare, Medicaid or managed care program needed by the patient, have an available bed and are willing to accept the patient.  Yes   Patient/family informed of [facility]'s ownership interest in [facility] and [facility], as well as of the fact that they are under no obligation to receive care at these facilities.  PASRR submitted to EDS on       PASRR number received on       Existing PASRR number confirmed on 04/14/16     FL2 transmitted to all facilities in geographic area requested by pt/family on 04/14/16     FL2 transmitted to all facilities within larger geographic area on       Patient informed that his/her managed care company has contracts with or will negotiate with certain facilities, including the following:        Yes   Patient/family informed of bed offers received.  [facility]     Physician recommends and patient chooses bed at      Patient to be transferred to  (ALF - [facility]) on 04/14/16.  Patient to be transferred to facility by [facility]     Patient family notified on 04/14/16 of  transfer.  Name of family member notified:  Tobin ChadMaria  Belin      PHYSICIAN       Additional Comment:    _______________________________________________ Antionette PolesKimberly L Kissie Ziolkowski, LCSW 04/14/2016, 4:42 PM

## 2016-05-14 ENCOUNTER — Ambulatory Visit: Payer: Medicare Other | Admitting: Internal Medicine

## 2016-05-22 ENCOUNTER — Encounter (HOSPITAL_COMMUNITY): Payer: Self-pay | Admitting: Emergency Medicine

## 2016-05-22 ENCOUNTER — Emergency Department (HOSPITAL_COMMUNITY)

## 2016-05-22 ENCOUNTER — Inpatient Hospital Stay (HOSPITAL_COMMUNITY)
Admission: EM | Admit: 2016-05-22 | Discharge: 2016-05-25 | DRG: 189 | Disposition: E | Attending: Family Medicine | Admitting: Family Medicine

## 2016-05-22 DIAGNOSIS — F17210 Nicotine dependence, cigarettes, uncomplicated: Secondary | ICD-10-CM | POA: Diagnosis present

## 2016-05-22 DIAGNOSIS — Z8249 Family history of ischemic heart disease and other diseases of the circulatory system: Secondary | ICD-10-CM

## 2016-05-22 DIAGNOSIS — G8929 Other chronic pain: Secondary | ICD-10-CM | POA: Diagnosis present

## 2016-05-22 DIAGNOSIS — J9601 Acute respiratory failure with hypoxia: Secondary | ICD-10-CM | POA: Diagnosis present

## 2016-05-22 DIAGNOSIS — Z961 Presence of intraocular lens: Secondary | ICD-10-CM | POA: Diagnosis present

## 2016-05-22 DIAGNOSIS — Z515 Encounter for palliative care: Secondary | ICD-10-CM | POA: Diagnosis not present

## 2016-05-22 DIAGNOSIS — F419 Anxiety disorder, unspecified: Secondary | ICD-10-CM | POA: Diagnosis present

## 2016-05-22 DIAGNOSIS — Z981 Arthrodesis status: Secondary | ICD-10-CM

## 2016-05-22 DIAGNOSIS — Z9841 Cataract extraction status, right eye: Secondary | ICD-10-CM

## 2016-05-22 DIAGNOSIS — K219 Gastro-esophageal reflux disease without esophagitis: Secondary | ICD-10-CM | POA: Diagnosis present

## 2016-05-22 DIAGNOSIS — D638 Anemia in other chronic diseases classified elsewhere: Secondary | ICD-10-CM | POA: Diagnosis present

## 2016-05-22 DIAGNOSIS — E559 Vitamin D deficiency, unspecified: Secondary | ICD-10-CM | POA: Diagnosis present

## 2016-05-22 DIAGNOSIS — R402212 Coma scale, best verbal response, none, at arrival to emergency department: Secondary | ICD-10-CM | POA: Diagnosis present

## 2016-05-22 DIAGNOSIS — R4182 Altered mental status, unspecified: Secondary | ICD-10-CM | POA: Diagnosis not present

## 2016-05-22 DIAGNOSIS — G9340 Encephalopathy, unspecified: Secondary | ICD-10-CM | POA: Diagnosis not present

## 2016-05-22 DIAGNOSIS — J9622 Acute and chronic respiratory failure with hypercapnia: Principal | ICD-10-CM

## 2016-05-22 DIAGNOSIS — E1151 Type 2 diabetes mellitus with diabetic peripheral angiopathy without gangrene: Secondary | ICD-10-CM | POA: Diagnosis present

## 2016-05-22 DIAGNOSIS — Z9981 Dependence on supplemental oxygen: Secondary | ICD-10-CM

## 2016-05-22 DIAGNOSIS — J9621 Acute and chronic respiratory failure with hypoxia: Secondary | ICD-10-CM | POA: Diagnosis present

## 2016-05-22 DIAGNOSIS — R402112 Coma scale, eyes open, never, at arrival to emergency department: Secondary | ICD-10-CM | POA: Diagnosis present

## 2016-05-22 DIAGNOSIS — Z803 Family history of malignant neoplasm of breast: Secondary | ICD-10-CM

## 2016-05-22 DIAGNOSIS — G9341 Metabolic encephalopathy: Secondary | ICD-10-CM | POA: Insufficient documentation

## 2016-05-22 DIAGNOSIS — Z7982 Long term (current) use of aspirin: Secondary | ICD-10-CM

## 2016-05-22 DIAGNOSIS — Z66 Do not resuscitate: Secondary | ICD-10-CM | POA: Diagnosis present

## 2016-05-22 DIAGNOSIS — I110 Hypertensive heart disease with heart failure: Secondary | ICD-10-CM | POA: Diagnosis present

## 2016-05-22 DIAGNOSIS — M549 Dorsalgia, unspecified: Secondary | ICD-10-CM | POA: Diagnosis present

## 2016-05-22 DIAGNOSIS — Z7951 Long term (current) use of inhaled steroids: Secondary | ICD-10-CM

## 2016-05-22 DIAGNOSIS — M8580 Other specified disorders of bone density and structure, unspecified site: Secondary | ICD-10-CM | POA: Diagnosis present

## 2016-05-22 DIAGNOSIS — E785 Hyperlipidemia, unspecified: Secondary | ICD-10-CM | POA: Diagnosis present

## 2016-05-22 DIAGNOSIS — J9602 Acute respiratory failure with hypercapnia: Secondary | ICD-10-CM

## 2016-05-22 DIAGNOSIS — Z90710 Acquired absence of both cervix and uterus: Secondary | ICD-10-CM

## 2016-05-22 DIAGNOSIS — Z9842 Cataract extraction status, left eye: Secondary | ICD-10-CM

## 2016-05-22 DIAGNOSIS — R402312 Coma scale, best motor response, none, at arrival to emergency department: Secondary | ICD-10-CM | POA: Diagnosis present

## 2016-05-22 DIAGNOSIS — J449 Chronic obstructive pulmonary disease, unspecified: Secondary | ICD-10-CM | POA: Diagnosis present

## 2016-05-22 DIAGNOSIS — F329 Major depressive disorder, single episode, unspecified: Secondary | ICD-10-CM | POA: Diagnosis present

## 2016-05-22 DIAGNOSIS — Z888 Allergy status to other drugs, medicaments and biological substances status: Secondary | ICD-10-CM

## 2016-05-22 DIAGNOSIS — I5032 Chronic diastolic (congestive) heart failure: Secondary | ICD-10-CM | POA: Diagnosis present

## 2016-05-22 LAB — COMPREHENSIVE METABOLIC PANEL
ALBUMIN: 3.1 g/dL — AB (ref 3.5–5.0)
ALT: 14 U/L (ref 14–54)
ANION GAP: 7 (ref 5–15)
AST: 26 U/L (ref 15–41)
Alkaline Phosphatase: 66 U/L (ref 38–126)
BUN: 26 mg/dL — ABNORMAL HIGH (ref 6–20)
CHLORIDE: 100 mmol/L — AB (ref 101–111)
CO2: 34 mmol/L — ABNORMAL HIGH (ref 22–32)
Calcium: 8.2 mg/dL — ABNORMAL LOW (ref 8.9–10.3)
Creatinine, Ser: 1.16 mg/dL — ABNORMAL HIGH (ref 0.44–1.00)
GFR calc Af Amer: 54 mL/min — ABNORMAL LOW (ref >60)
GFR calc non Af Amer: 46 mL/min — ABNORMAL LOW (ref >60)
Glucose, Bld: 143 mg/dL — ABNORMAL HIGH (ref 65–99)
POTASSIUM: 5.3 mmol/L — AB (ref 3.5–5.1)
SODIUM: 141 mmol/L (ref 135–145)
TOTAL PROTEIN: 5.7 g/dL — AB (ref 6.5–8.1)

## 2016-05-22 LAB — CBC WITH DIFFERENTIAL/PLATELET
BASOS ABS: 0 10*3/uL (ref 0.0–0.1)
Basophils Relative: 0 %
EOS ABS: 0 10*3/uL (ref 0.0–0.7)
Eosinophils Relative: 0 %
HCT: 37.9 % (ref 36.0–46.0)
HEMOGLOBIN: 9.4 g/dL — AB (ref 12.0–15.0)
LYMPHS ABS: 1.5 10*3/uL (ref 0.7–4.0)
Lymphocytes Relative: 9 %
MCH: 21.4 pg — AB (ref 26.0–34.0)
MCHC: 24.8 g/dL — AB (ref 30.0–36.0)
MCV: 86.3 fL (ref 78.0–100.0)
MONO ABS: 1.5 10*3/uL — AB (ref 0.1–1.0)
Monocytes Relative: 9 %
NEUTROS ABS: 13.9 10*3/uL — AB (ref 1.7–7.7)
Neutrophils Relative %: 82 %
PLATELETS: 239 10*3/uL (ref 150–400)
RBC: 4.39 MIL/uL (ref 3.87–5.11)
RDW: 20.8 % — AB (ref 11.5–15.5)
WBC: 16.9 10*3/uL — ABNORMAL HIGH (ref 4.0–10.5)

## 2016-05-22 LAB — PROTIME-INR
INR: 0.86
Prothrombin Time: 11.7 seconds (ref 11.4–15.2)

## 2016-05-22 LAB — I-STAT CG4 LACTIC ACID, ED: LACTIC ACID, VENOUS: 0.62 mmol/L (ref 0.5–1.9)

## 2016-05-22 LAB — BRAIN NATRIURETIC PEPTIDE: B NATRIURETIC PEPTIDE 5: 638.5 pg/mL — AB (ref 0.0–100.0)

## 2016-05-22 LAB — I-STAT TROPONIN, ED: TROPONIN I, POC: 0.33 ng/mL — AB (ref 0.00–0.08)

## 2016-05-22 MED ORDER — ACETAMINOPHEN 650 MG RE SUPP: 650.0000 mg | Freq: Four times a day (QID) | RECTAL | Status: DC | PRN

## 2016-05-22 MED ORDER — GLYCOPYRROLATE 0.2 MG/ML IJ SOLN: 0.2000 mg | INTRAMUSCULAR | Status: DC | PRN

## 2016-05-22 MED ORDER — GLYCOPYRROLATE 1 MG PO TABS
1.0000 mg | ORAL_TABLET | ORAL | Status: DC | PRN
  Filled 2016-05-22: qty 1

## 2016-05-22 MED ORDER — NALOXONE HCL 2 MG/2ML IJ SOSY
PREFILLED_SYRINGE | INTRAMUSCULAR | Status: DC | PRN
Start: 2016-05-22 — End: 2016-05-22
  Administered 2016-05-22: 0.4 mg via INTRAVENOUS

## 2016-05-22 MED ORDER — VANCOMYCIN HCL 10 G IV SOLR
1250.0000 mg | Freq: Once | INTRAVENOUS | Status: AC
  Administered 2016-05-22: 1250 mg via INTRAVENOUS
  Filled 2016-05-22: qty 1250

## 2016-05-22 MED ORDER — SODIUM CHLORIDE 0.9 % IV SOLN: 250.0000 mL | INTRAVENOUS | Status: DC | PRN

## 2016-05-22 MED ORDER — HALOPERIDOL LACTATE 5 MG/ML IJ SOLN: 0.5000 mg | INTRAMUSCULAR | Status: DC | PRN

## 2016-05-22 MED ORDER — SODIUM CHLORIDE 0.9 % IV SOLN
1.0000 mg/h | INTRAVENOUS | Status: DC
  Administered 2016-05-22: 1 mg/h via INTRAVENOUS
  Filled 2016-05-22: qty 10

## 2016-05-22 MED ORDER — SODIUM CHLORIDE 0.9 % IV SOLN
INTRAVENOUS | Status: DC
  Administered 2016-05-22: 15 mL/h via INTRAVENOUS

## 2016-05-22 MED ORDER — HALOPERIDOL 1 MG PO TABS: 0.5000 mg | ORAL_TABLET | ORAL | Status: DC | PRN

## 2016-05-22 MED ORDER — BIOTENE DRY MOUTH MT LIQD: 15.0000 mL | OROMUCOSAL | Status: DC | PRN

## 2016-05-22 MED ORDER — NALOXONE HCL 0.4 MG/ML IJ SOLN
0.4000 mg | Freq: Once | INTRAMUSCULAR | Status: DC
  Filled 2016-05-22: qty 1

## 2016-05-22 MED ORDER — SODIUM CHLORIDE 0.9% FLUSH
3.0000 mL | Freq: Two times a day (BID) | INTRAVENOUS | Status: DC
  Administered 2016-05-22: 3 mL via INTRAVENOUS

## 2016-05-22 MED ORDER — ACETAMINOPHEN 325 MG PO TABS: 650.0000 mg | ORAL_TABLET | Freq: Four times a day (QID) | ORAL | Status: DC | PRN

## 2016-05-22 MED ORDER — HALOPERIDOL LACTATE 2 MG/ML PO CONC
0.5000 mg | ORAL | Status: DC | PRN
  Filled 2016-05-22: qty 0.3

## 2016-05-22 MED ORDER — SODIUM CHLORIDE 0.9 % IV SOLN
500.0000 mg | Freq: Two times a day (BID) | INTRAVENOUS | Status: DC
  Filled 2016-05-22: qty 500

## 2016-05-22 MED ORDER — POLYVINYL ALCOHOL 1.4 % OP SOLN
1.0000 [drp] | Freq: Four times a day (QID) | OPHTHALMIC | Status: DC | PRN
  Filled 2016-05-22: qty 15

## 2016-05-22 MED ORDER — DEXTROSE 5 % IV SOLN: 2.0000 g | Freq: Once | INTRAVENOUS | Status: DC

## 2016-05-22 MED ORDER — CEFEPIME HCL 1 G IJ SOLR
1.0000 g | Freq: Three times a day (TID) | INTRAMUSCULAR | Status: DC
  Administered 2016-05-22: 1 g via INTRAVENOUS
  Filled 2016-05-22 (×2): qty 1

## 2016-05-22 MED ORDER — SODIUM CHLORIDE 0.9 % IV BOLUS (SEPSIS)
1000.0000 mL | Freq: Once | INTRAVENOUS | Status: AC
  Administered 2016-05-22: 1000 mL via INTRAVENOUS

## 2016-05-22 MED ORDER — ONDANSETRON HCL 4 MG/2ML IJ SOLN: 4.0000 mg | Freq: Four times a day (QID) | INTRAMUSCULAR | Status: DC | PRN

## 2016-05-22 MED ORDER — SODIUM CHLORIDE 0.9% FLUSH: 3.0000 mL | INTRAVENOUS | Status: DC | PRN

## 2016-05-22 MED ORDER — ONDANSETRON 4 MG PO TBDP: 4.0000 mg | ORAL_TABLET | Freq: Four times a day (QID) | ORAL | Status: DC | PRN

## 2016-05-22 MED ORDER — VANCOMYCIN HCL IN DEXTROSE 1-5 GM/200ML-% IV SOLN: 1000.0000 mg | Freq: Once | INTRAVENOUS | Status: DC

## 2016-05-22 NOTE — ED Notes (Signed)
Recollect blood sample for VBG

## 2016-05-22 NOTE — ED Triage Notes (Addendum)
To ED via [facility] from [facility] -- with c/o increasing unresponsiveness and shallow resp since about 12 noon. Pt is a hosp[facility] of [place] pt--  On arrival-- ventilations assisted per BVM, pt does have spontaneous resp, but shallow.  Pupils - 2cm- reactive

## 2016-05-22 NOTE — Progress Notes (Signed)
Chaplain paged to provide support to this family who is present and in route for their family member in respiratory distress.  Patient is a Hospice patient.  Grandson currently at bedside and says that other family members are on the way.    04/30/2016 1936  Clinical Encounter Type  Visited With Patient;Family  Visit Type Spiritual support;Social support;ED  Referral From Nurse  Spiritual Encounters  Spiritual Needs Prayer  Stress Factors  Family Stress Factors Health changes   Darryll Capers

## 2016-05-22 NOTE — Progress Notes (Signed)
Pharmacy Antibiotic Note  Nicole Maxwell is a 71 y.o. female admitted on 24-May-2016 with pneumonia.  Pharmacy has been consulted for vancomycin/cefepime dosing. Patient without baseline labs this admission, previous SCr was 0.79 on 3/20. Will follow up labs and adjust maintenance doses as needed.  Plan: - Vancomycin 1250 mg IV x1 loading dose - Vancomycin 500 mg IV q12h - Cefepime 1g IV q8h - Monitor renal function, C&S and clinical progression - Monitor vancomycin trough as indicated  Weight: 128 lb (58.1 kg)  Temp (24hrs), Avg:96.9 F (36.1 C), Min:96.9 F (36.1 C), Max:96.9 F (36.1 C)  No results for input(s): WBC, CREATININE, LATICACIDVEN, VANCOTROUGH, VANCOPEAK, VANCORANDOM, GENTTROUGH, GENTPEAK, GENTRANDOM, TOBRATROUGH, TOBRAPEAK, TOBRARND, AMIKACINPEAK, AMIKACINTROU, AMIKACIN in the last 168 hours.  CrCl cannot be calculated (Patient's most recent lab result is older than the maximum 21 days allowed.).    Allergies  Allergen Reactions  . Brovana [Arformoterol Tartrate] Shortness Of Breath and Cough    General intolerance  . Budesonide Shortness Of Breath and Cough    General intolerance  . Mucomyst [Acetylcysteine] Shortness Of Breath  . Ativan [Lorazepam]     Antimicrobials this admission: Vancomycin 4/28 >>  Cefepime 4/28 >>   Microbiology results: 4/28 BCx:   Thank you for allowing pharmacy to be a part of this patient's care.  Casilda Carls, PharmD, BCPS PGY-2 Infectious Diseases Pharmacy Resident Pager: [phone] May 24, 2016 6:48 PM

## 2016-05-22 NOTE — Progress Notes (Signed)
Pt taken off BIPAP and placed on 100% NRB for comfort care.

## 2016-05-22 NOTE — Progress Notes (Signed)
   Met w/ family _0 .  Chaplain not needed at this time.  Please page on-call chaplain, as needed.  - Rev. Meadow MDiv ThM

## 2016-05-22 NOTE — H&P (Signed)
History and Physical    Nicole Maxwell ZOX:[id number] DOB: 04-08-1945 DOA: 11-Jun-2016  PCP: [name], MD   I have briefly reviewed patients previous medical reports in [facility].  Patient coming from: SNF  Chief Complaint: respiratory distress and unresponsiveness.  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 Patient apparently with increase SOB earlier today and received some morphine (as per family reports and following hospice care); patient breathing continue deteriorating and patient became unresponsive. In ED glasgow score 3 and require BIPAP. There was no CP, no nausea, no vomiting, fever, chills, HA's, focal weakness or any other prodromic symptom.  ED Course: patient received gentle IVF resuscitation on arrival; received 2 doses of Narcan, one time dose of empiric antibiotics (broad spectrum) and had CXR done (essentially r/o infection). Also, started on BIPAP after following severely abnormal ABG (PH 7.05, CO 95). [facility] called to admit patient for further care.  Review of Systems:  Patient unresponsive and with acute encephalopathy; ROS unable to be reviewed.  Past Medical History:  Diagnosis Date  . Alcohol abuse, in remission quit 2004  . Anemia of chronic disease   . Anxiety    Xanax prn  . Arthritis    "my whole body"  . Asthma   . Chronic back pain    "goes from the lower part of my back on down my legs"  (04/17/2014)  . Chronic respiratory failure with hypercapnia (HCC)   . COPD (chronic obstructive pulmonary disease) (HCC)    cxr 04/03/2010 - hyperinflation  . COPD exacerbation (HCC)   . Dysphagia   . Esophageal dysmotility   . HCAP (healthcare-associated pneumonia)   . Hyperlipidemia   . Hypertension   . Leukocytosis   . Neuromuscular disorder (HCC)    lumbar disc  . On home  oxygen therapy    "2L; suppose to be 24/7" (04/17/2014)  . Osteopenia   . Physical deconditioning   . Pneumonia    "3 times in the last month or so" (04/17/2014)  . Protein calorie malnutrition (HCC)   . PVD (peripheral vascular disease) (HCC)   . Shortness of breath    with anxiety  and activty  . Tobacco abuse    " I am addicted"   . Type II diabetes mellitus (HCC)   . Vitamin D deficiency disease    03/26/2010 - 9.9ng/mL. Started on replacemnt    Past Surgical History:  Procedure Laterality Date  . ABDOMINAL HYSTERECTOMY     "partial"  . APPENDECTOMY  1960  . BACK SURGERY    . CARPAL TUNNEL RELEASE Bilateral    CTS repair 2000 (R), 2006 (L)/notes 01/01/2014  . CATARACT EXTRACTION W/ INTRAOCULAR LENS  IMPLANT, BILATERAL Bilateral 2015  . LUMBAR LAMINECTOMY/DECOMPRESSION MICRODISCECTOMY  2005   L4-5/notes 03/06/2010  . POSTERIOR LUMBAR FUSION  2002  . TONSILLECTOMY  1960    Social History  reports that she has been smoking Cigarettes.  She has a 14.00 pack-year smoking history. She has never used smokeless tobacco. Per record, some social alcohol consumption was reported. She reports that she does not use drugs.  Allergies  Allergen Reactions  . Brovana [Arformoterol Tartrate] Shortness Of Breath and Cough    General intolerance  . Budesonide Shortness Of Breath and Cough    General intolerance  . Mucomyst [Acetylcysteine] Shortness Of  Breath  . Ativan [Lorazepam]     Family History  Problem Relation Age of Onset  . Heart attack Sister 40    CABG  . Breast cancer Sister   . Cancer Brother   . Heart attack Brother   . Hypertension Other     all siblings  . Anesthesia problems Neg Hx     Prior to Admission medications   Medication Sig Start Date End Date Taking? Authorizing Provider  acetaminophen (TYLENOL) 500 MG tablet Take 500 mg by mouth every 6 (six) hours as needed for mild pain.   Yes Historical Provider, MD  albuterol (PROAIR HFA) 108 (90 Base) MCG/ACT  inhaler Inhale 2 puffs into the lungs every 6 (six) hours as needed. Patient taking differently: Inhale 2 puffs into the lungs every 4 (four) hours as needed for wheezing or shortness of breath.  03/04/16  Yes Drema Dallas, MD  ALPRAZolam Prudy Feeler) 0.5 MG tablet Take 1 tablet (0.5 mg total) by mouth 2 (two) times daily as needed for anxiety. 03/04/16  Yes Drema Dallas, MD  ALPRAZolam Prudy Feeler) 1 MG tablet Take 1 tablet (1 mg total) by mouth at bedtime. 03/04/16  Yes Drema Dallas, MD  amLODipine (NORVASC) 10 MG tablet Take 1 tablet (10 mg total) by mouth daily. 04/15/16  Yes Lenox Ponds, MD  aspirin EC 81 MG EC tablet Take 1 tablet (81 mg total) by mouth daily. 01/09/14  Yes [name], MD  Dextromethorphan-Guaifenesin (MUCINEX DM MAXIMUM STRENGTH) 60-1200 MG TB12 Take 1 tablet by mouth every 12 (twelve) hours. 03/04/16  Yes Drema Dallas, MD  fluticasone (FLONASE) 50 MCG/ACT nasal spray Place 1 spray into both nostrils 2 (two) times daily. 03/04/16  Yes Drema Dallas, MD  furosemide (LASIX) 20 MG tablet Take 20 mg by mouth daily.   Yes Historical Provider, MD  gabapentin (NEURONTIN) 300 MG capsule Take 300 mg by mouth 2 (two) times daily.   Yes Historical Provider, MD  hydroxypropyl methylcellulose / hypromellose (ISOPTO TEARS / GONIOVISC) 2.5 % ophthalmic solution Place 1 drop into both eyes 3 (three) times daily as needed for dry eyes. 03/04/16  Yes Drema Dallas, MD  ipratropium-albuterol (DUONEB) 0.5-2.5 (3) MG/3ML SOLN Take 3 mLs by nebulization 4 (four) times daily. 03/04/16  Yes Drema Dallas, MD  latanoprost (XALATAN) 0.005 % ophthalmic solution Place 1 drop into both eyes at bedtime. 03/04/16  Yes Drema Dallas, MD  Morphine Sulfate (MORPHINE CONCENTRATE) 10 mg / 0.5 ml concentrated solution Medication is on the MAR PRN but the dose is unclear   Yes Historical Provider, MD  OXYGEN Inhale 2 L into the lungs continuous.   Yes Historical Provider, MD  pantoprazole (PROTONIX) 40 MG tablet Take 1  tablet (40 mg total) by mouth daily. 04/15/16  Yes Lenox Ponds, MD  potassium chloride SA (K-DUR,KLOR-CON) 20 MEQ tablet Take 20 mEq by mouth daily. 01/11/16  Yes Historical Provider, MD  predniSONE (DELTASONE) 5 MG tablet Take 1 tablet (5 mg total) by mouth daily with breakfast. 03/05/16  Yes Drema Dallas, MD  QUEtiapine (SEROQUEL) 25 MG tablet Take 0.5 tablets (12.5 mg total) by mouth at bedtime. 03/04/16  Yes Drema Dallas, MD  albuterol (PROVENTIL) (2.5 MG/3ML) 0.083% nebulizer solution Take 3 mLs (2.5 mg total) by nebulization every 4 (four) hours as needed for wheezing or shortness of breath. (may take 1 every 4 hours as needed for wheezing/shortness of breath) DX: 496 Patient not taking: Reported  on 04/26/2016 03/04/16   Drema Dallas, MD  budesonide-formoterol [facility]) 160-4.5 MCG/ACT inhaler Inhale 2 puffs into the lungs 2 (two) times daily. Patient not taking: Reported on 04/25/2016 03/04/16   Drema Dallas, MD  HYDROcodone-acetaminophen (NORCO/VICODIN) 5-325 MG tablet Take 1 tablet by mouth every 4 (four) hours as needed for moderate pain. Patient not taking: Reported on 05/11/2016 03/04/16   Drema Dallas, MD  predniSONE (DELTASONE) 10 MG tablet Take 4 tablets for 4 days, 3 tablets for 4 days, 2 tablets for 4 days, 1 tablet for 4 days, then 0.5 tablet daily. Patient not taking: Reported on 05/19/2016 04/14/16   Lenox Ponds, MD    Physical Exam: Vitals:   05/17/2016 2030 05/20/2016 2045 05/21/2016 2115 05/07/2016 2200  BP: (!) 146/49 (!) 142/47 (!) 137/51 (!) 133/48  Pulse: 83 83 82 81  Resp: (!) 26 (!) 28 (!) 28 (!) 27  Temp:      TempSrc:      SpO2: 100% 100% 95% 90%  Weight:          Constitutional: patient unresponsive, afebrile, without pupil or palpebral reaction to light/touch.  Eyes: no icterus, no nystagmus ENMT: Mucous membranes were dry. Posterior pharynx clear of any exudate or lesions. edentulous No thrush  Neck: supple, no masses, no thyromegaly, no  JVD Respiratory: positive scattered rhonchi; poor air movement bilaterally; no wheezing heard.  Cardiovascular: S1 & S2 heard, regular rate; no rubs or gallops. No carotid bruits.  Abdomen: No distension, no tenderness, no masses palpated. Musculoskeletal: No joint deformity upper and lower extremities. No contractures. Positive 1+ edema bilaterally.   Skin: no rashes or lesions appreciated on limited exam.  Neurologic: unresponsive. No moving any limbs and unable to follow any commands. Glasgow score 3.  Psychiatric: unresponsive      Labs on Admission: I have personally reviewed following labs and imaging studies  CBC:  Recent Labs Lab 04/30/2016 1847  WBC 16.9*  NEUTROABS 13.9*  HGB 9.4*  HCT 37.9  MCV 86.3  PLT 239   Basic Metabolic Panel:  Recent Labs Lab 05/07/2016 1847  NA 141  K 5.3*  CL 100*  CO2 34*  GLUCOSE 143*  BUN 26*  CREATININE 1.16*  CALCIUM 8.2*   Liver Function Tests:  Recent Labs Lab 05/04/2016 1847  AST 26  ALT 14  ALKPHOS 66  BILITOT <0.3*  PROT 5.7*  ALBUMIN 3.1*   Coagulation Profile:  Recent Labs Lab 05/20/2016 1847  INR 0.86   Urine analysis:    Component Value Date/Time   COLORURINE STRAW (A) 04/11/2016 0028   APPEARANCEUR CLEAR 04/11/2016 0028   LABSPEC 1.013 04/11/2016 0028   PHURINE 6.0 04/11/2016 0028   GLUCOSEU NEGATIVE 04/11/2016 0028   HGBUR NEGATIVE 04/11/2016 0028   BILIRUBINUR NEGATIVE 04/11/2016 0028   KETONESUR NEGATIVE 04/11/2016 0028   PROTEINUR 30 (A) 04/11/2016 0028   NITRITE NEGATIVE 04/11/2016 0028   LEUKOCYTESUR NEGATIVE 04/11/2016 0028     Radiological Exams on Admission: Dg Chest Port 1 View  Result Date: 05/22/2016 CLINICAL DATA:  Unresponsive patient. EXAM: PORTABLE CHEST 1 VIEW COMPARISON:  04/11/2016 FINDINGS: The cardiac silhouette is enlarged. Mediastinal contours appear intact. Calcific atherosclerotic disease and tortuosity of the aorta. There is no evidence of focal airspace consolidation,  pleural effusion or pneumothorax. Osseous structures are without acute abnormality. Soft tissues are grossly normal. IMPRESSION: No evidence of focal pulmonary infiltrate. Enlarged cardiac silhouette. Calcific atherosclerotic disease of the aorta. Electronically Signed   By: Sharlyne Pacas  Dimitrova M.D.   On: 05/17/2016 18:59    EKG: None  Assessment/Plan 1-Acute on chronic respiratory failure with hypercapnia and hypoxemia (HCC)  -patient with terminal COPD; who presented to ED from SNF with unresponsiveness and worsening breathing. -patient with PH of 7.05 and CO2 of 95; also with O2 of 87% while receiving BIPAP at 100%. -discussed with family and given the fact that patient GSC was 3 and lack of response from BIPAP; the decisions was made to pursuit full comfort care and End of life care. -admitted to med-surg bed, non-rebreather and morphine drip -end of life protocol initiated and hospice contacted  -palliative care also consulted for further assistance regarding symptoms management. -patient's rest of medications discontinued; as she is unable to take any pills currently. -will use PRN albuterol nebulizer for comfort.  2-Hypercapnic encephalopathy: -due to resp failure and elevated CO2 -failed BIPAP in ED and per code status, patient is a DNI -will focus on full comfort   3-end of life care -GIP admission  -hospice contacted -morphine drip -chaplain notified  *rest of medical problems at this time, are essentially non relevant and care will be dedicated to comfort care.     Time: 55 minutes.   DVT prophylaxis: none; full comfort care  Code Status: DNR/DNI Family Communication:  daughter at bedside Disposition Plan:  Will admit for end of life care. Consults called:  Palliative care; hospice also notified. Admission status: med-surg bed; GIP status; < 2 midnights (will most likely expired overnight).    Vassie Loll MD [facility] Pager [phone]  If  7PM-7AM, please contact night-coverage [email or web address] Password [facility]  05/14/2016, 11:17 PM

## 2016-05-22 NOTE — Progress Notes (Signed)
This note also relates to the following rows which could not be included: SpO2 - Cannot attach notes to unvalidated device data  Pt arrived to ED by EMS using BVM. RT continued BVM from time of arrival until 18:39 when the pt was placed on a NRB per MD request. RT will continue to monitor.

## 2016-05-22 NOTE — ED Provider Notes (Signed)
[facility]-EMERGENCY DEPT Provider Note   CSN: [id number] Arrival date & time: 06/16/2016  1823     History   Chief Complaint Chief Complaint  Patient presents with  . Respiratory Distress  . Fever    HPI Nicole Maxwell is a 71 y.o. female.  The history is provided by the EMS personnel and a relative.  Altered Mental Status   This is a recurrent problem. The current episode started 6 to 12 hours ago. The problem has not changed since onset.Associated symptoms include unresponsiveness. Risk factors: Pt on hospice and given morphine for first time last night. Her past medical history is significant for COPD. Her past medical history does not include head trauma.    Past Medical History:  Diagnosis Date  . Alcohol abuse, in remission quit 2004  . Anemia of chronic disease   . Anxiety    Xanax prn  . Arthritis    "my whole body"  . Asthma   . Chronic back pain    "goes from the lower part of my back on down my legs"  (04/17/2014)  . Chronic respiratory failure with hypercapnia (HCC)   . COPD (chronic obstructive pulmonary disease) (HCC)    cxr 04/03/2010 - hyperinflation  . COPD exacerbation (HCC)   . Dysphagia   . Esophageal dysmotility   . HCAP (healthcare-associated pneumonia)   . Hyperlipidemia   . Hypertension   . Leukocytosis   . Neuromuscular disorder (HCC)    lumbar disc  . On home oxygen therapy    "2L; suppose to be 24/7" (04/17/2014)  . Osteopenia   . Physical deconditioning   . Pneumonia    "3 times in the last month or so" (04/17/2014)  . Protein calorie malnutrition (HCC)   . PVD (peripheral vascular disease) (HCC)   . Shortness of breath    with anxiety  and activty  . Tobacco abuse    " I am addicted"   . Type II diabetes mellitus (HCC)   . Vitamin D deficiency disease    03/26/2010 - 9.9ng/mL. Started on replacemnt    Patient Active Problem List   Diagnosis Date Noted  . Symptomatic anemia 04/11/2016  . COPD with acute exacerbation (HCC) 04/11/2016  .  Acute and chronic respiratory failure with hypercapnia (HCC) 04/11/2016  . Somnolence   . DNR (do not resuscitate) discussion 03/02/2016  . Palliative care by specialist 03/02/2016  . Acute encephalopathy   . Aspiration pneumonia of left lower lobe due to vomit (HCC)   . Essential hypertension   . Chronic diastolic CHF (congestive heart failure) (HCC)   . Acute on chronic respiratory failure with hypoxia and hypercapnia (HCC) 02/28/2016  . Acute LUQ pain 02/28/2016  . Sepsis (HCC) 11/25/2015  . Tobacco use 06/30/2015  . Protein-calorie malnutrition, severe 03/23/2015  . Slow transit constipation 01/07/2015  . Normocytic anemia 11/27/2014  . Physical deconditioning 07/01/2014  . Pulmonary infiltrate in left lung on chest x-ray 04/16/2014  . DM type 2 (diabetes mellitus, type 2) (HCC) 04/16/2014  . Vitamin D deficiency disease 04/16/2014  . Esophageal dysmotility 02/27/2014  . HCAP (healthcare-associated pneumonia) 02/21/2014  . Chronic diastolic congestive heart failure (HCC) 01/07/2014  . Acute on chronic respiratory failure (HCC) 01/03/2014  . COPD exacerbation (HCC) 12/09/2011  . HTN (hypertension) 05/12/2010  . Hyperlipidemia 05/12/2010  . Severe chronic obstructive pulmonary disease (HCC) 04/29/2010    Past Surgical History:  Procedure Laterality Date  . ABDOMINAL HYSTERECTOMY     "partial"  .  APPENDECTOMY  1960  . BACK SURGERY    . CARPAL TUNNEL RELEASE Bilateral    CTS repair 2000 (R), 2006 (L)/notes 01/01/2014  . CATARACT EXTRACTION W/ INTRAOCULAR LENS  IMPLANT, BILATERAL Bilateral 2015  . LUMBAR LAMINECTOMY/DECOMPRESSION MICRODISCECTOMY  2005   L4-5/notes 03/06/2010  . POSTERIOR LUMBAR FUSION  2002  . TONSILLECTOMY  1960    OB History    No data available       Home Medications    Prior to Admission medications   Medication Sig Start Date End Date Taking? Authorizing Provider  acetaminophen (TYLENOL) 500 MG tablet Take 500 mg by mouth every 6 (six) hours as  needed for mild pain.   Yes Historical Provider, MD  albuterol (PROAIR HFA) 108 (90 Base) MCG/ACT inhaler Inhale 2 puffs into the lungs every 6 (six) hours as needed. Patient taking differently: Inhale 2 puffs into the lungs every 4 (four) hours as needed for wheezing or shortness of breath.  03/04/16  Yes Drema Dallas, MD  ALPRAZolam Prudy Feeler) 0.5 MG tablet Take 1 tablet (0.5 mg total) by mouth 2 (two) times daily as needed for anxiety. 03/04/16  Yes Drema Dallas, MD  ALPRAZolam Prudy Feeler) 1 MG tablet Take 1 tablet (1 mg total) by mouth at bedtime. 03/04/16  Yes Drema Dallas, MD  amLODipine (NORVASC) 10 MG tablet Take 1 tablet (10 mg total) by mouth daily. 04/15/16  Yes Lenox Ponds, MD  aspirin EC 81 MG EC tablet Take 1 tablet (81 mg total) by mouth daily. 01/09/14  Yes [name], MD  Dextromethorphan-Guaifenesin (MUCINEX DM MAXIMUM STRENGTH) 60-1200 MG TB12 Take 1 tablet by mouth every 12 (twelve) hours. 03/04/16  Yes Drema Dallas, MD  fluticasone (FLONASE) 50 MCG/ACT nasal spray Place 1 spray into both nostrils 2 (two) times daily. 03/04/16  Yes Drema Dallas, MD  furosemide (LASIX) 20 MG tablet Take 20 mg by mouth daily.   Yes Historical Provider, MD  gabapentin (NEURONTIN) 300 MG capsule Take 300 mg by mouth 2 (two) times daily.   Yes Historical Provider, MD  hydroxypropyl methylcellulose / hypromellose (ISOPTO TEARS / GONIOVISC) 2.5 % ophthalmic solution Place 1 drop into both eyes 3 (three) times daily as needed for dry eyes. 03/04/16  Yes Drema Dallas, MD  ipratropium-albuterol (DUONEB) 0.5-2.5 (3) MG/3ML SOLN Take 3 mLs by nebulization 4 (four) times daily. 03/04/16  Yes Drema Dallas, MD  latanoprost (XALATAN) 0.005 % ophthalmic solution Place 1 drop into both eyes at bedtime. 03/04/16  Yes Drema Dallas, MD  Morphine Sulfate (MORPHINE CONCENTRATE) 10 mg / 0.5 ml concentrated solution Medication is on the MAR PRN but the dose is unclear   Yes Historical Provider, MD  OXYGEN Inhale 2 L into  the lungs continuous.   Yes Historical Provider, MD  pantoprazole (PROTONIX) 40 MG tablet Take 1 tablet (40 mg total) by mouth daily. 04/15/16  Yes Lenox Ponds, MD  potassium chloride SA (K-DUR,KLOR-CON) 20 MEQ tablet Take 20 mEq by mouth daily. 01/11/16  Yes Historical Provider, MD  predniSONE (DELTASONE) 5 MG tablet Take 1 tablet (5 mg total) by mouth daily with breakfast. 03/05/16  Yes Drema Dallas, MD  QUEtiapine (SEROQUEL) 25 MG tablet Take 0.5 tablets (12.5 mg total) by mouth at bedtime. 03/04/16  Yes Drema Dallas, MD  albuterol (PROVENTIL) (2.5 MG/3ML) 0.083% nebulizer solution Take 3 mLs (2.5 mg total) by nebulization every 4 (four) hours as needed for wheezing or shortness of breath. (  may take 1 every 4 hours as needed for wheezing/shortness of breath) DX: 496 Patient not taking: Reported on 2016-06-11 03/04/16   Drema Dallas, MD  budesonide-formoterol [facility]) 160-4.5 MCG/ACT inhaler Inhale 2 puffs into the lungs 2 (two) times daily. Patient not taking: Reported on 06/11/16 03/04/16   Drema Dallas, MD  HYDROcodone-acetaminophen (NORCO/VICODIN) 5-325 MG tablet Take 1 tablet by mouth every 4 (four) hours as needed for moderate pain. Patient not taking: Reported on 06-11-16 03/04/16   Drema Dallas, MD  predniSONE (DELTASONE) 10 MG tablet Take 4 tablets for 4 days, 3 tablets for 4 days, 2 tablets for 4 days, 1 tablet for 4 days, then 0.5 tablet daily. Patient not taking: Reported on 2016-06-11 04/14/16   Lenox Ponds, MD    Family History Family History  Problem Relation Age of Onset  . Heart attack Sister 52    CABG  . Breast cancer Sister   . Cancer Brother   . Heart attack Brother   . Hypertension Other     all siblings  . Anesthesia problems Neg Hx     Social History Social History  Substance Use Topics  . Smoking status: Current Some Day Smoker    Packs/day: 0.25    Years: 56.00    Types: Cigarettes  . Smokeless tobacco: Never Used     Comment: 5 cigs per  week/10.9.17  . Alcohol use 0.0 oz/week     Comment: h/o heavy ETOH quit 2004     Allergies   Brovana [arformoterol tartrate]; Budesonide; Mucomyst [acetylcysteine]; and Ativan [lorazepam]   Review of Systems Review of Systems  Unable to perform ROS: Patient unresponsive     Physical Exam Updated Vital Signs BP (!) 133/48   Pulse 81   Temp (!) 96.9 F (36.1 C) (Rectal)   Resp (!) 27   Wt 58.1 kg   SpO2 90%   BMI 23.41 kg/m   Physical Exam  Constitutional: She appears well-developed and well-nourished. She has a sickly appearance. She appears ill. No distress.  HENT:  Head: Normocephalic and atraumatic.  Mouth/Throat: Mucous membranes are dry.  Eyes: Conjunctivae are normal.  Neck: Neck supple.  Cardiovascular: Normal rate and regular rhythm.   No murmur heard. Pulmonary/Chest: Tachypnea noted. She is in respiratory distress. She has wheezes in the right middle field, the right lower field, the left middle field and the left lower field.  Abdominal: Soft. There is no tenderness.  Musculoskeletal: She exhibits no edema.  Neurological: She is unresponsive. GCS eye subscore is 1. GCS verbal subscore is 1. GCS motor subscore is 1.  Skin: Skin is warm and dry. No rash noted.  Nursing note and vitals reviewed.    ED Treatments / Results  Labs (all labs ordered are listed, but only abnormal results are displayed) Labs Reviewed  COMPREHENSIVE METABOLIC PANEL - Abnormal; Notable for the following:       Result Value   Potassium 5.3 (*)    Chloride 100 (*)    CO2 34 (*)    Glucose, Bld 143 (*)    BUN 26 (*)    Creatinine, Ser 1.16 (*)    Calcium 8.2 (*)    Total Protein 5.7 (*)    Albumin 3.1 (*)    Total Bilirubin <0.3 (*)    GFR calc non Af Amer 46 (*)    GFR calc Af Amer 54 (*)    All other components within normal limits  CBC WITH DIFFERENTIAL/PLATELET -  Abnormal; Notable for the following:    WBC 16.9 (*)    Hemoglobin 9.4 (*)    MCH 21.4 (*)    MCHC  24.8 (*)    RDW 20.8 (*)    Neutro Abs 13.9 (*)    Monocytes Absolute 1.5 (*)    All other components within normal limits  BRAIN NATRIURETIC PEPTIDE - Abnormal; Notable for the following:    B Natriuretic Peptide 638.5 (*)    All other components within normal limits  I-STAT TROPOININ, ED - Abnormal; Notable for the following:    Troponin i, poc 0.33 (*)    All other components within normal limits  CULTURE, BLOOD (ROUTINE X 2)  CULTURE, BLOOD (ROUTINE X 2)  PROTIME-INR  URINALYSIS, ROUTINE W REFLEX MICROSCOPIC  I-STAT CG4 LACTIC ACID, ED  I-STAT VENOUS BLOOD GAS, ED  I-STAT ARTERIAL BLOOD GAS, ED  I-STAT CG4 LACTIC ACID, ED    EKG  EKG Interpretation None       Radiology Dg Chest Port 1 View  Result Date: 2016-06-19 CLINICAL DATA:  Unresponsive patient. EXAM: PORTABLE CHEST 1 VIEW COMPARISON:  04/11/2016 FINDINGS: The cardiac silhouette is enlarged. Mediastinal contours appear intact. Calcific atherosclerotic disease and tortuosity of the aorta. There is no evidence of focal airspace consolidation, pleural effusion or pneumothorax. Osseous structures are without acute abnormality. Soft tissues are grossly normal. IMPRESSION: No evidence of focal pulmonary infiltrate. Enlarged cardiac silhouette. Calcific atherosclerotic disease of the aorta. Electronically Signed   By: Ted Mcalpine M.D.   On: 06-19-2016 18:59    Procedures Procedures (including critical care time)  Medications Ordered in ED Medications  morphine 250 mg in sodium chloride 0.9 % 250 mL (1 mg/mL) infusion (not administered)  sodium chloride 0.9 % bolus 1,000 mL (0 mLs Intravenous Stopped 2016/06/19 1950)    And  sodium chloride 0.9 % bolus 1,000 mL (0 mLs Intravenous Stopped Jun 19, 2016 2244)  vancomycin (VANCOCIN) 1,250 mg in sodium chloride 0.9 % 250 mL IVPB (0 mg Intravenous Stopped Jun 19, 2016 2119)     Initial Impression / Assessment and Plan / ED Course  I have reviewed the triage vital signs and the  nursing notes.  Pertinent labs & imaging results that were available during my care of the patient were reviewed by me and considered in my medical decision making (see chart for details).     71 year old female with past medical history of COPD with chronic hypercarbic respiratory failure currently on hospice who presents in the setting of altered mental status. Per family patient was able to go out to dinner with them last night and then was given morphine by nursing facility overnight. On arrival family today patient was somnolent and unresponsive. EMS was contacted and EMS found patient to have oxygen saturations in the low 80s on home O2 and patient started on nonrebreather. Patient patient did have a DO NOT RESUSCITATE/DO NOT INTUBATE form with EMS. Vital signs otherwise within normal limits.  On arrival in emergency Department patient was GCS of 3. Patient continued on nonrebreather and quickly discussed goals of care with daughter who is power of attorney. Daughter confirms the patient does not want to be intubated but she would like antibiotics and labs obtained. At this time due to patient's mental status did not feel comfortable starting BiPAP while awaiting family to arrive. Laboratory analysis consistent with possible pneumonia versus worsening of known chronic COPD. Patient was given antibiotics and at this time family arrive. Family requested BiPAP as patient has had  hypercarbic respiratory failure that improved on BiPAP in the past. Although I believe patient is still inappropriate for BiPAP BiPAP was initiated while discussing care with hospitalist team. Arterial blood gas revealed significant hypercarbia and acidosis. Discussed case with hospitalist who had long discussion with family. At this time family is in agreement that patient does not want to be intubated and patient would prefer to be switched to comfort care. Patient will be transitioned to comfort care under care of the  hospitalist and stable at time of transfer of care.  Final Clinical Impressions(s) / ED Diagnoses   Final diagnoses:  Acute on chronic respiratory failure with hypercapnia (HCC)  Altered mental status, unspecified altered mental status type    New Prescriptions New Prescriptions   No medications on file     Stacy Gardner, MD 05-25-16 2317    Gwyneth Sprout, MD 05/04/2016 2238

## 2016-05-23 ENCOUNTER — Encounter (HOSPITAL_COMMUNITY): Payer: Self-pay

## 2016-05-23 DIAGNOSIS — F419 Anxiety disorder, unspecified: Secondary | ICD-10-CM | POA: Diagnosis present

## 2016-05-23 DIAGNOSIS — J9602 Acute respiratory failure with hypercapnia: Secondary | ICD-10-CM | POA: Diagnosis not present

## 2016-05-23 DIAGNOSIS — M8580 Other specified disorders of bone density and structure, unspecified site: Secondary | ICD-10-CM | POA: Diagnosis present

## 2016-05-23 DIAGNOSIS — F329 Major depressive disorder, single episode, unspecified: Secondary | ICD-10-CM | POA: Diagnosis present

## 2016-05-23 DIAGNOSIS — Z9981 Dependence on supplemental oxygen: Secondary | ICD-10-CM | POA: Diagnosis not present

## 2016-05-23 DIAGNOSIS — Z66 Do not resuscitate: Secondary | ICD-10-CM | POA: Diagnosis present

## 2016-05-23 DIAGNOSIS — I5032 Chronic diastolic (congestive) heart failure: Secondary | ICD-10-CM | POA: Diagnosis present

## 2016-05-23 DIAGNOSIS — M549 Dorsalgia, unspecified: Secondary | ICD-10-CM | POA: Diagnosis present

## 2016-05-23 DIAGNOSIS — G8929 Other chronic pain: Secondary | ICD-10-CM | POA: Diagnosis present

## 2016-05-23 DIAGNOSIS — E785 Hyperlipidemia, unspecified: Secondary | ICD-10-CM | POA: Diagnosis present

## 2016-05-23 DIAGNOSIS — D638 Anemia in other chronic diseases classified elsewhere: Secondary | ICD-10-CM | POA: Diagnosis present

## 2016-05-23 DIAGNOSIS — R402112 Coma scale, eyes open, never, at arrival to emergency department: Secondary | ICD-10-CM | POA: Diagnosis present

## 2016-05-23 DIAGNOSIS — J9601 Acute respiratory failure with hypoxia: Secondary | ICD-10-CM

## 2016-05-23 DIAGNOSIS — J9622 Acute and chronic respiratory failure with hypercapnia: Secondary | ICD-10-CM | POA: Diagnosis present

## 2016-05-23 DIAGNOSIS — J449 Chronic obstructive pulmonary disease, unspecified: Secondary | ICD-10-CM | POA: Diagnosis present

## 2016-05-23 DIAGNOSIS — J9621 Acute and chronic respiratory failure with hypoxia: Secondary | ICD-10-CM | POA: Diagnosis present

## 2016-05-23 DIAGNOSIS — Z8249 Family history of ischemic heart disease and other diseases of the circulatory system: Secondary | ICD-10-CM | POA: Diagnosis not present

## 2016-05-23 DIAGNOSIS — R402312 Coma scale, best motor response, none, at arrival to emergency department: Secondary | ICD-10-CM | POA: Diagnosis present

## 2016-05-23 DIAGNOSIS — E1151 Type 2 diabetes mellitus with diabetic peripheral angiopathy without gangrene: Secondary | ICD-10-CM | POA: Diagnosis present

## 2016-05-23 DIAGNOSIS — Z9842 Cataract extraction status, left eye: Secondary | ICD-10-CM | POA: Diagnosis not present

## 2016-05-23 DIAGNOSIS — Z90710 Acquired absence of both cervix and uterus: Secondary | ICD-10-CM | POA: Diagnosis not present

## 2016-05-23 DIAGNOSIS — G9340 Encephalopathy, unspecified: Secondary | ICD-10-CM | POA: Diagnosis present

## 2016-05-23 DIAGNOSIS — I110 Hypertensive heart disease with heart failure: Secondary | ICD-10-CM | POA: Diagnosis present

## 2016-05-23 DIAGNOSIS — R402212 Coma scale, best verbal response, none, at arrival to emergency department: Secondary | ICD-10-CM | POA: Diagnosis present

## 2016-05-23 DIAGNOSIS — R4182 Altered mental status, unspecified: Secondary | ICD-10-CM | POA: Diagnosis present

## 2016-05-23 DIAGNOSIS — Z515 Encounter for palliative care: Secondary | ICD-10-CM | POA: Diagnosis present

## 2016-05-23 DIAGNOSIS — E559 Vitamin D deficiency, unspecified: Secondary | ICD-10-CM | POA: Diagnosis present

## 2016-05-23 LAB — MRSA PCR SCREENING: MRSA by PCR: NEGATIVE

## 2016-05-23 MED ORDER — ALBUTEROL SULFATE (2.5 MG/3ML) 0.083% IN NEBU: 2.5000 mg | INHALATION_SOLUTION | RESPIRATORY_TRACT | Status: DC | PRN

## 2016-05-25 NOTE — Progress Notes (Signed)
   04/27/2016 0800  Clinical Encounter Type  Visited With Patient and family together  Visit Type Spiritual support  Referral From Nurse  Spiritual Encounters  Spiritual Needs Emotional  Stress Factors  Patient Stress Factors None identified  Family Stress Factors Exhausted;Health changes    Chaplain followed up per previous chaplain and [place] consult for end of life. Provided emotional support and ministry of presence to family. Page on call chaplain at [phone] for on call chaplain if needed. Derrian Rodak L. Salomon Fick, MDiv

## 2016-05-25 NOTE — Progress Notes (Signed)
   06/11/2016 1610  Attending Physican Contact  Attending Physician Notified Y  Attending Physician Name Dr. Cena Benton  Will the above attending physician sign death certificate?  (non ED physician) Yes  Post Mortem Checklist  Date of Death Jun 11, 2016  Time of Death 0910  Pronounced By Elease Hashimoto, RN Yvone Neu, RN  Next of kin notified Yes  Name of next of kin notified of death Tobin Chad  Contact Person's Relationship to Patient Daughter  Contact Person's Phone Number [phone]  Was the patient a No Code Blue or a Limited Code Blue? Yes  Did the patient die unattended? No  Patient restrained? Not applicable  Weight 58.6 kg (129 lb 3 oz)  [facility]  Notification Date 11-Jun-2016  Notification Time 0934  [place] [facility] Number [phone]  Is patient a potential donor? Y  Donation Type Skin  Autopsy  Autopsy requested by N/A  Patient Belongings/Medications Returned  Patient belongings from bedside/safe/pharmacy returned  Yes (daughtered notifed pillows were left in the room.)  Dead on Arrival (Emergency Department)  Patient dead on arrival? No  Medical Examiner  Is this a medical examiner's case? [facility] home name/address/phone # [facility] - [address] [place] [phone]  Planned location of pickup [place]

## 2016-05-25 NOTE — Discharge Summary (Signed)
Death Summary  Nicole Maxwell ZOX:[id number] DOB: 10/17/1945 DOA: May 29, 2016  PCP: [name], MD   Admit date: May 29, 2016 Date of Death: May 30, 2016  Final Diagnoses:  Active Problems:   Acute on chronic respiratory failure with hypercapnia (HCC)   Acute respiratory failure with hypoxia and hypercapnia (HCC) Hypercarbia encephalopathy    History of present illness/Hospital Course:  71 y/o woman with PMH significant for end stage COPD on hospice; type 2 diabetes, GERD, HTN, glaucoma, anxiety/depression; who presented from SNF due to worsening resp failure and unresponsiveness  1-Acute on chronic respiratory failure with hypercapnia and hypoxemia (HCC)  -patient with terminal COPD; who presented to ED from SNF with unresponsiveness and worsening breathing. -patient with PH of 7.05 and CO2 of 95; also with O2 of 87% while receiving BIPAP at 100%. -discussed with family and given the fact that patient GSC was 3 and lack of response from BIPAP; the decisions was made to pursuit full comfort care and End of life care.   Time: pronounced around 910 am.  Signed:  Penny Pia  [facility] 2016/05/30, 12:47 PM

## 2016-05-25 DEATH — deceased

## 2016-05-27 LAB — CULTURE, BLOOD (ROUTINE X 2)
CULTURE: NO GROWTH
Culture: NO GROWTH
SPECIAL REQUESTS: ADEQUATE
SPECIAL REQUESTS: ADEQUATE

## 2016-10-27 IMAGING — CR DG CHEST 2V
2 series · 2 of 2 positions shown · non-contrast
Comparison: 01/06/2014

CLINICAL DATA: Cough, congestion, shortness of breath common

EXAM:
CHEST  2 VIEW

[chest pa]
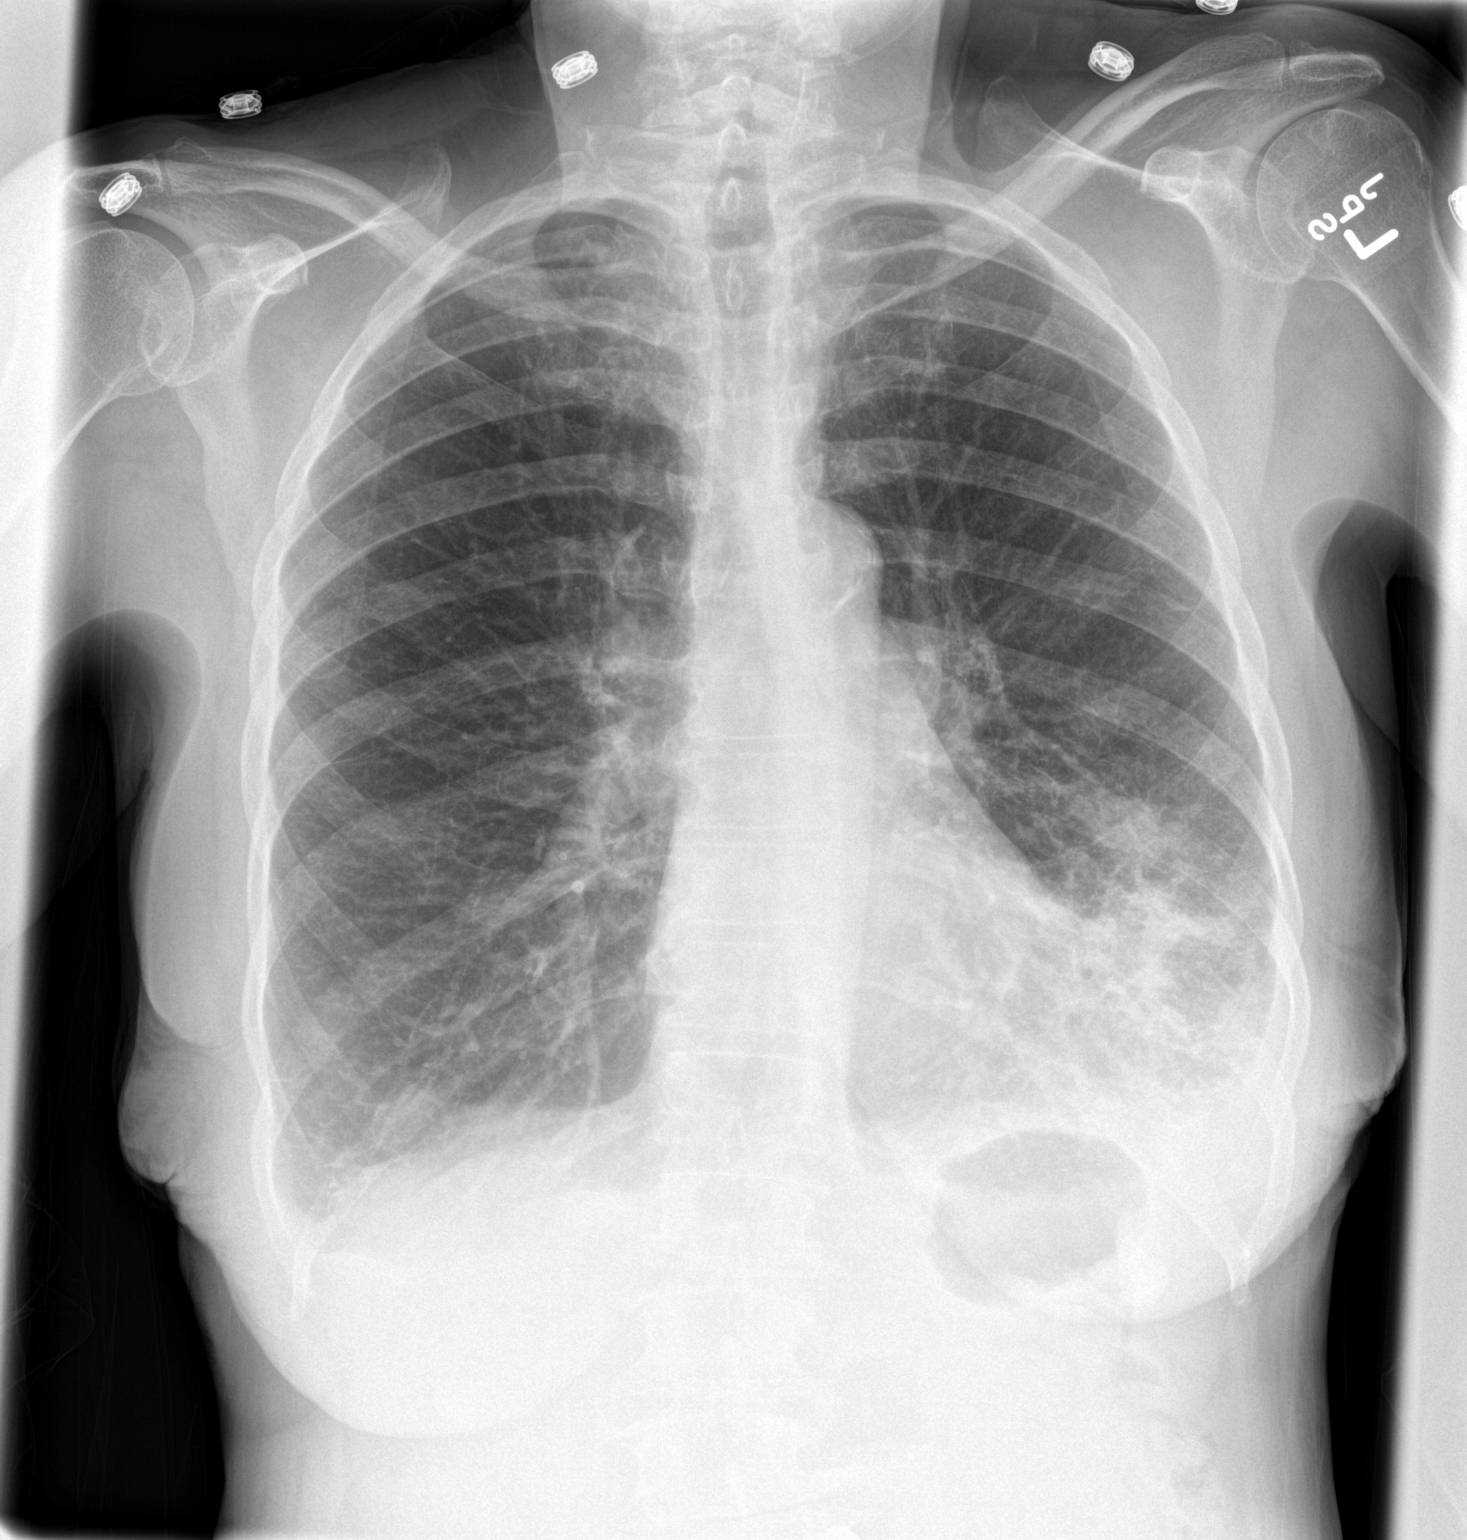

[chest lat]
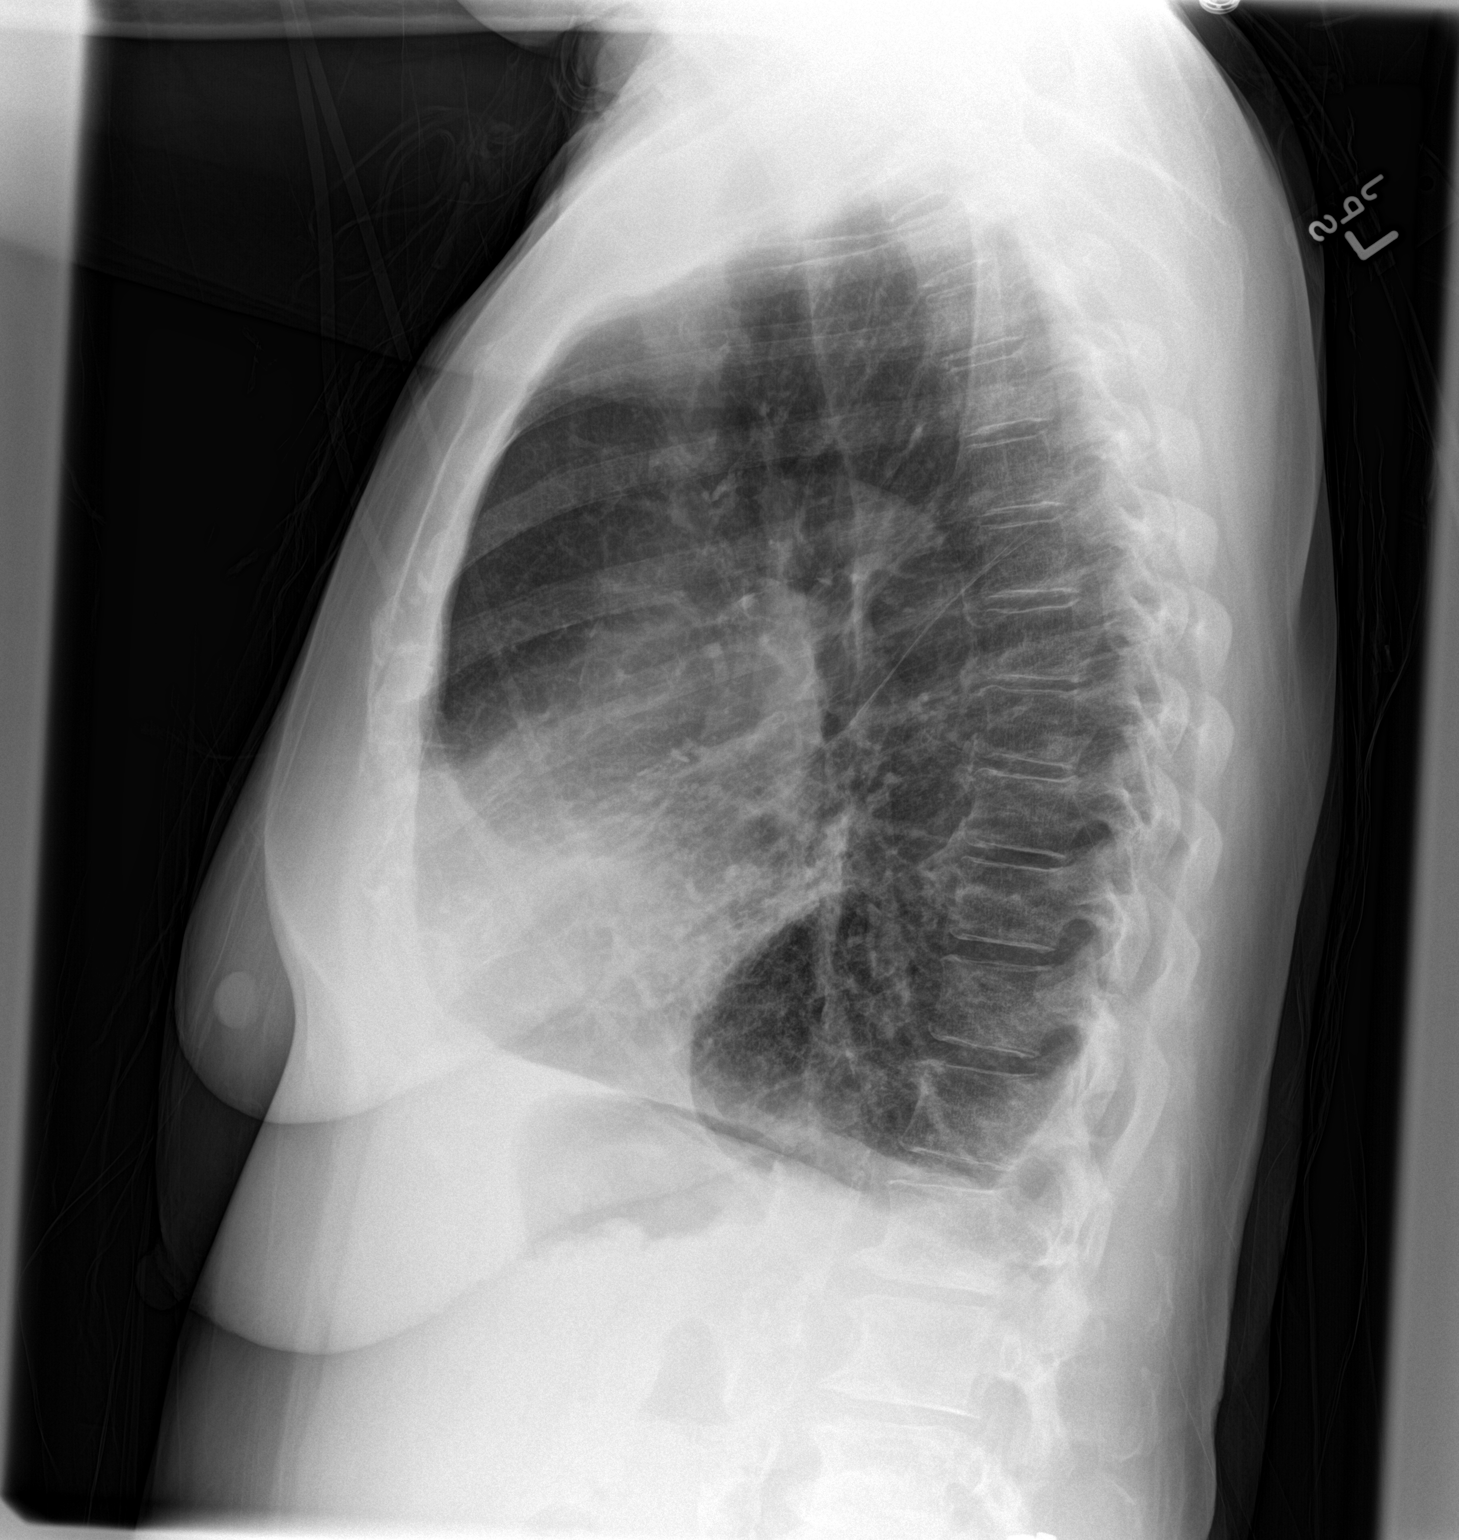

[2 of 2 positions shown; findings below may reference images not displayed]

FINDINGS: Ill-defined patchy nodular airspace process obscures the left
cardiac border and is anterior on the lateral view compatible with
lingula airspace disease. Appearance is compatible with pneumonia.
There is a small associated left pleural effusion. Right lung
remains clear. Stable mild hyperinflation. Negative for
pneumothorax. Normal heart size and vascularity. Atherosclerosis
noted of the aorta. Trachea is midline. Minor thoracic degenerative
changes.
IMPRESSION: Lingula airspace process compatible with pneumonia and a small
associated left effusion.

Recommend radiographic follow-up to document complete resolution.

## 2017-08-15 IMAGING — DX DG CHEST 2V
2 series · 2 of 2 positions shown · non-contrast
Comparison: 12/06/2014

CLINICAL DATA: Cough and short of breath.  COPD exacerbation.

EXAM:
CHEST  2 VIEW

[chest pa]
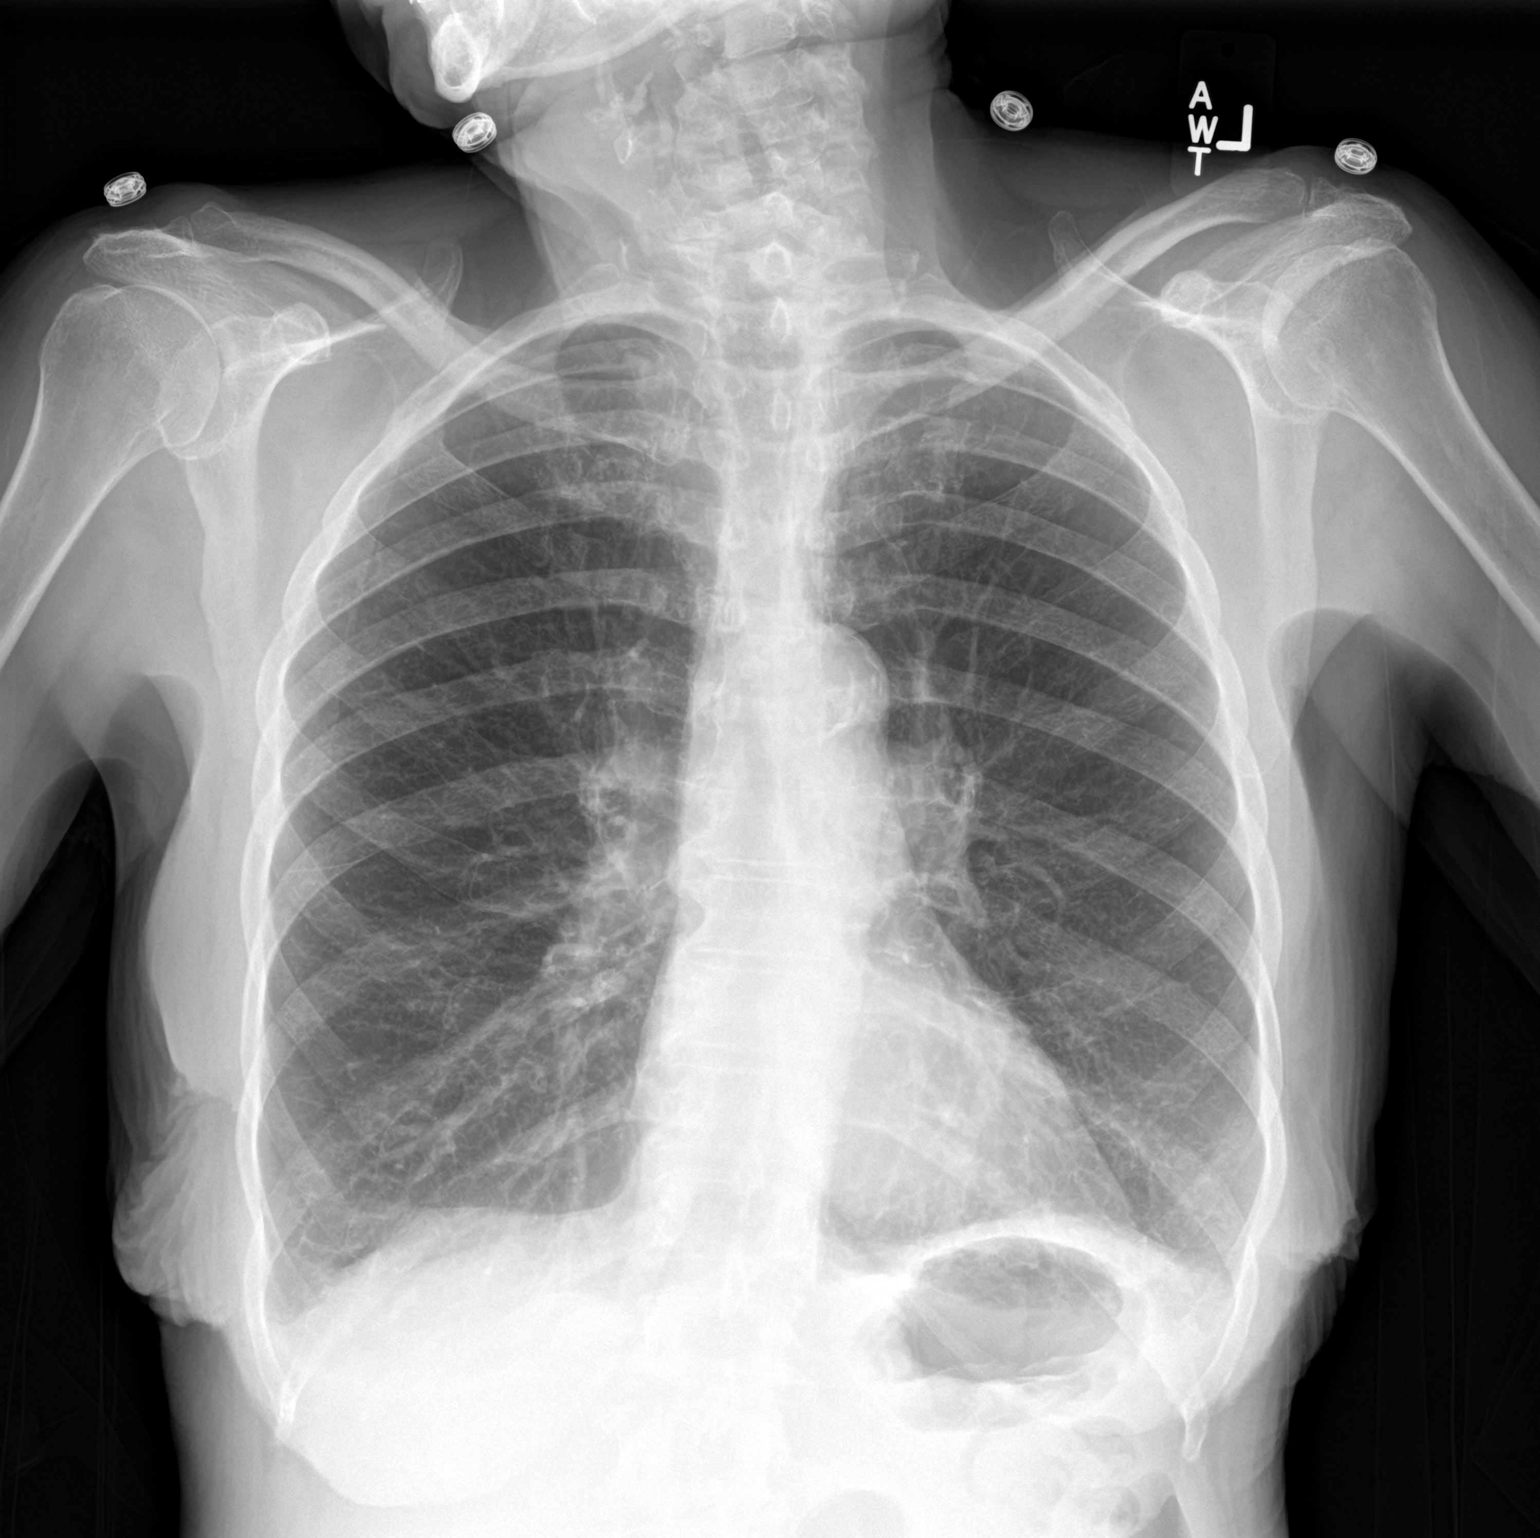

[chest lat]
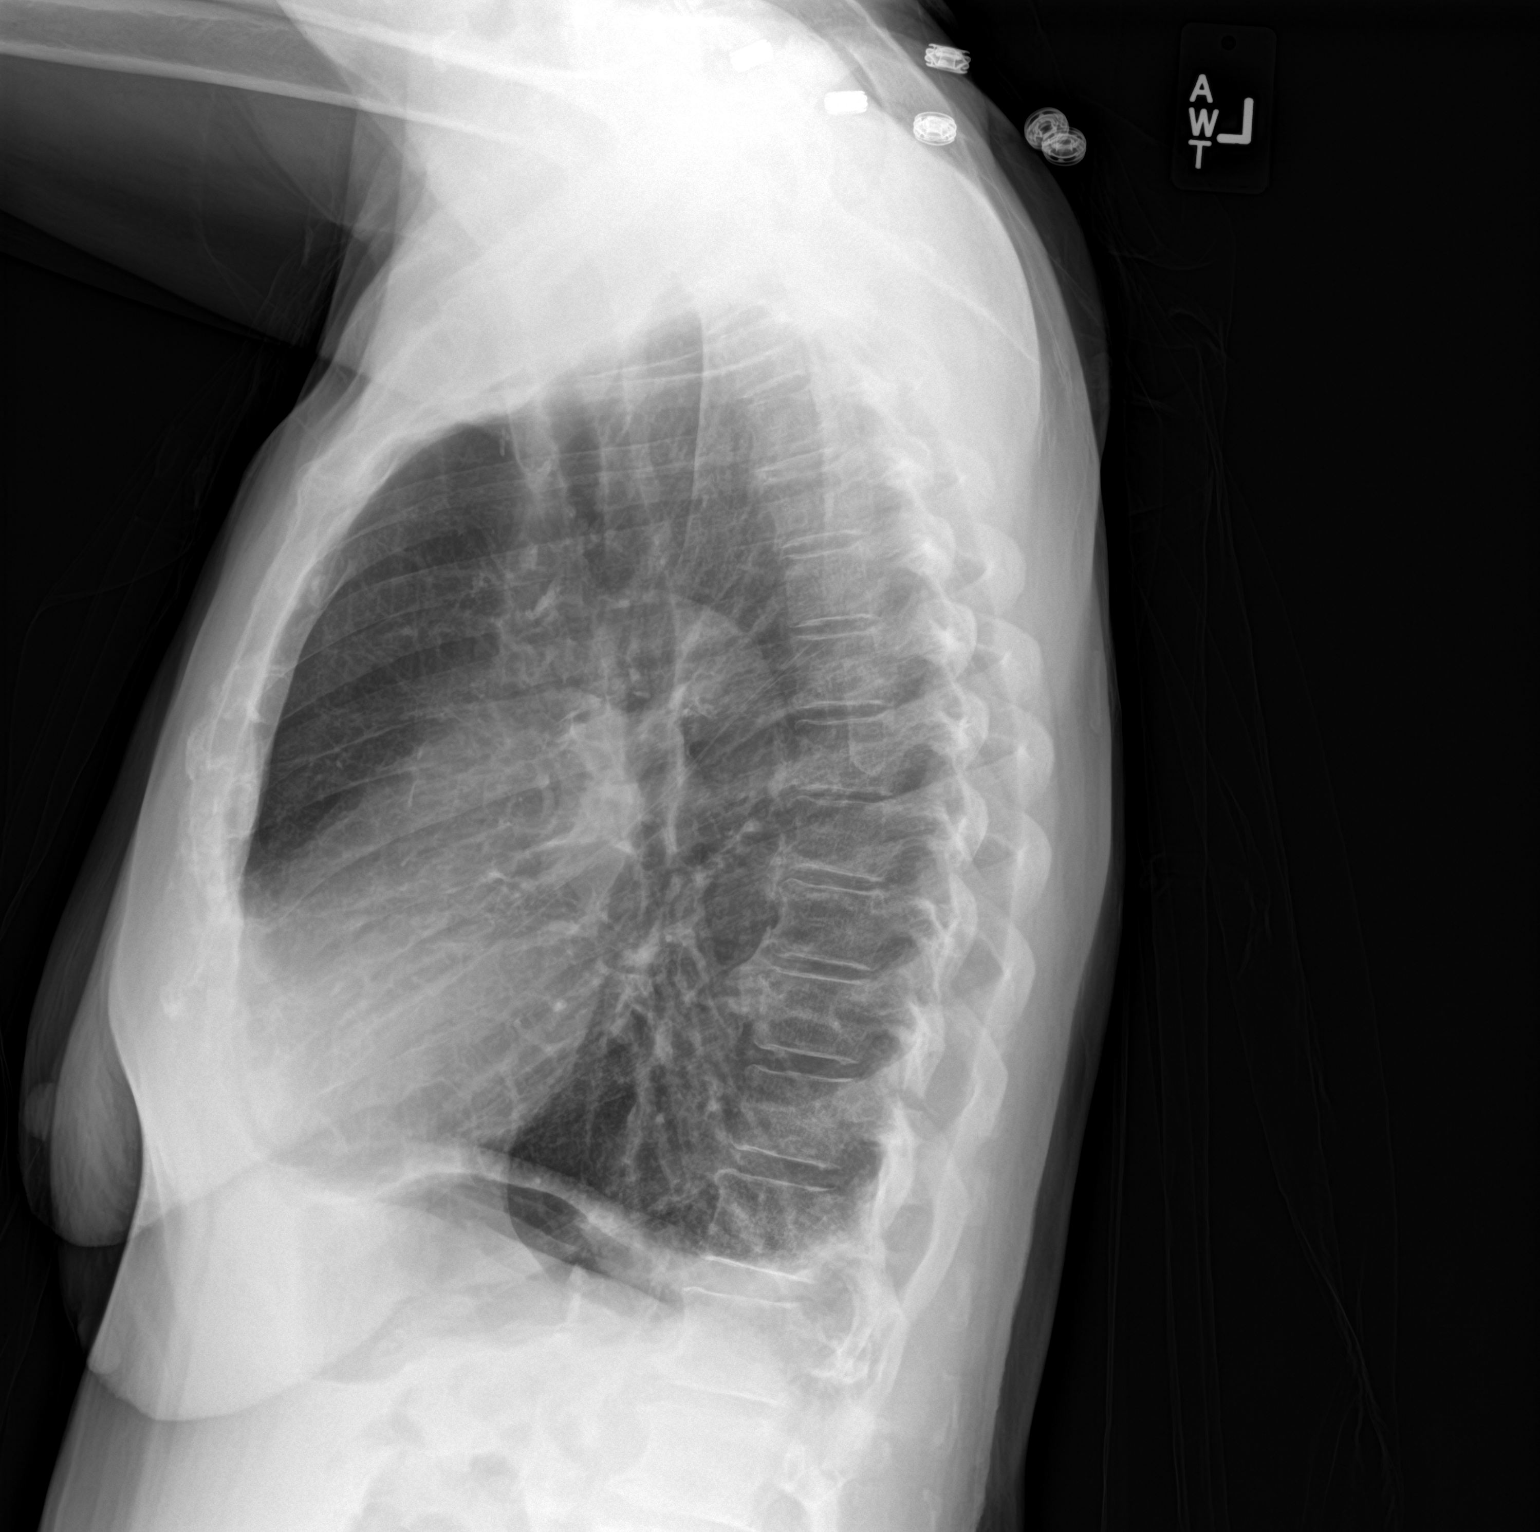

[2 of 2 positions shown; findings below may reference images not displayed]

FINDINGS: COPD with pulmonary hyperinflation. Negative for heart failure or
effusion. No mass lesion. Heart size within normal limits.
Atherosclerotic calcification in the aortic arch.

On the lateral view, there is question of a new density posteriorly
just above the diaphragm. This could represent an area of infiltrate
or an effusion. This is difficult to confirm on the frontal view.
IMPRESSION: COPD

Possible small area of infiltrate or pleural fluid in the posterior
lung base

## 2018-06-21 IMAGING — DX DG CHEST 1V PORT
1 series · 1 of 1 positions shown · non-contrast
Comparison: 09/10/2015

CLINICAL DATA: Weakness and dyspnea

EXAM:
PORTABLE CHEST 1 VIEW

[chest ap]
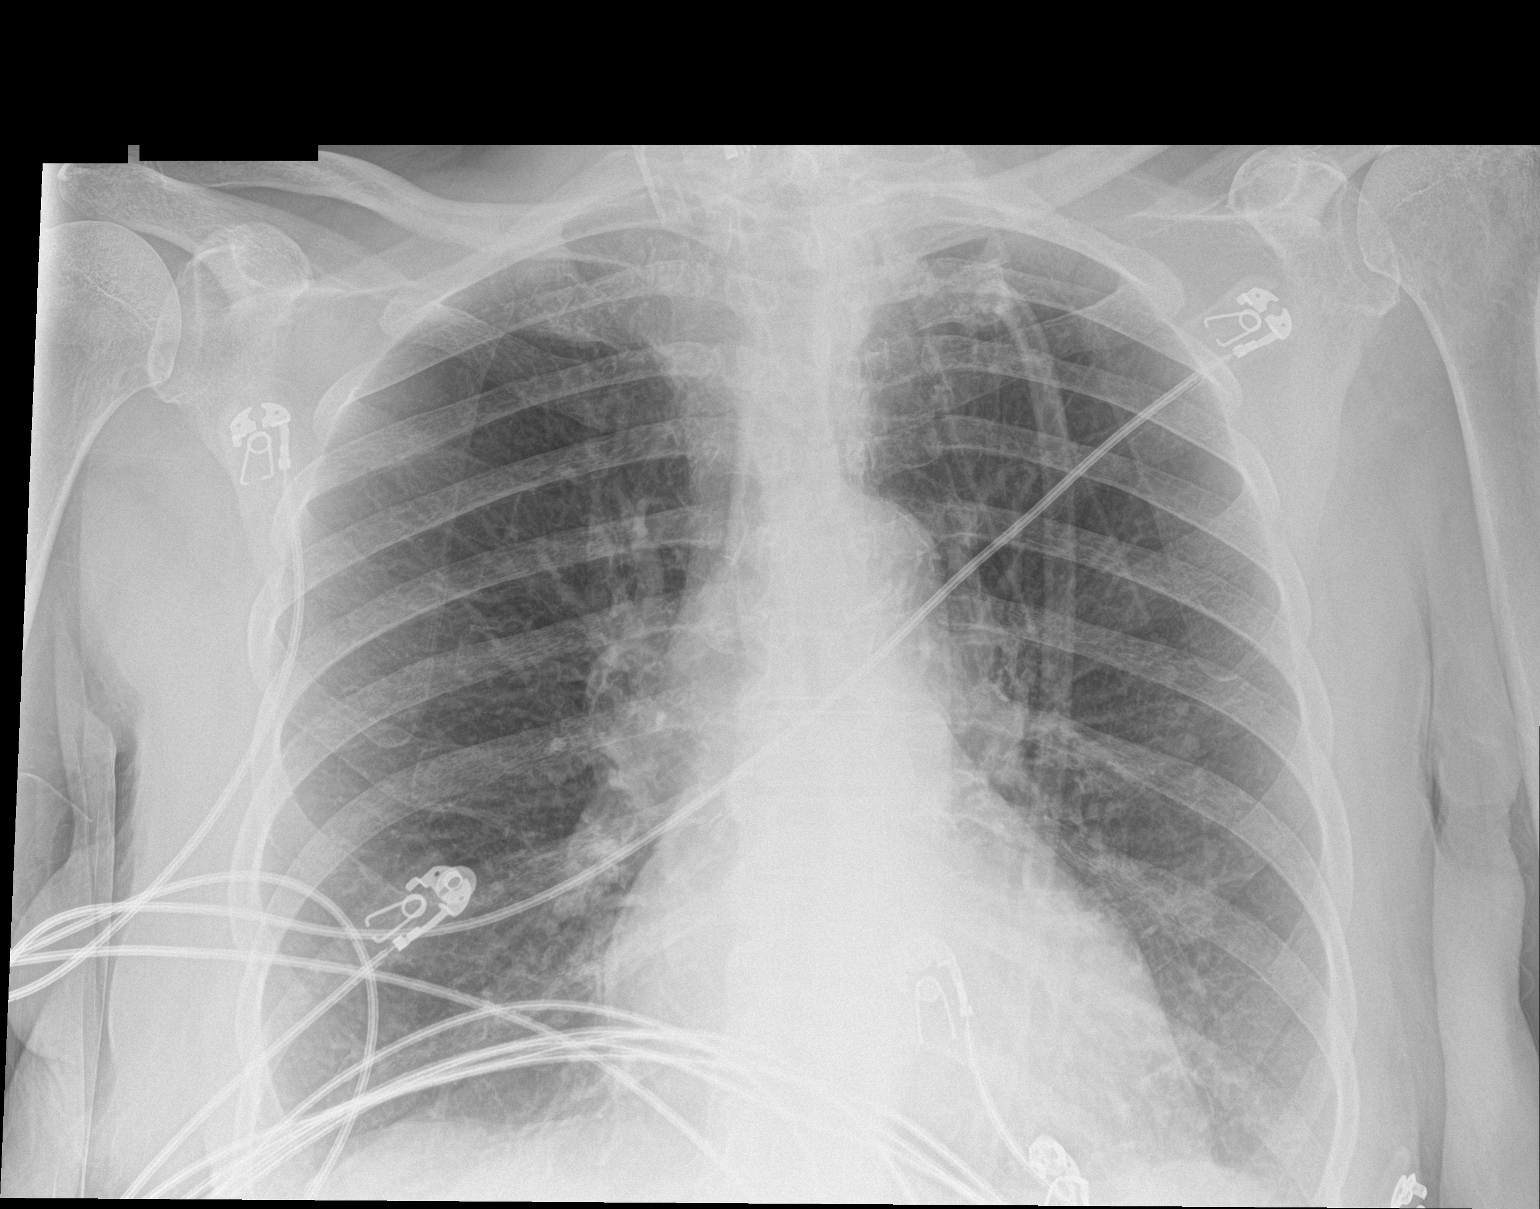

[1 of 1 positions shown; findings below may reference images not displayed]

FINDINGS: Cardiac shadow is stable. The lungs are well aerated bilaterally. No
focal infiltrate or sizable effusion is seen. No bony abnormality is
noted.
IMPRESSION: No acute abnormality seen.
